# Patient Record
Sex: Female | Born: 1960 | Race: Black or African American | Hispanic: No | Marital: Married | Smoking: Never smoker
Health system: Southern US, Community
[De-identification: ages and names within clinical notes are randomized; demographics above are authoritative.]

## PROBLEM LIST (undated history)

## (undated) DIAGNOSIS — E079 Disorder of thyroid, unspecified: Secondary | ICD-10-CM

## (undated) DIAGNOSIS — T7840XA Allergy, unspecified, initial encounter: Secondary | ICD-10-CM

## (undated) DIAGNOSIS — I10 Essential (primary) hypertension: Secondary | ICD-10-CM

## (undated) DIAGNOSIS — R011 Cardiac murmur, unspecified: Secondary | ICD-10-CM

## (undated) DIAGNOSIS — E7800 Pure hypercholesterolemia, unspecified: Secondary | ICD-10-CM

## (undated) DIAGNOSIS — R42 Dizziness and giddiness: Secondary | ICD-10-CM

## (undated) DIAGNOSIS — M1990 Unspecified osteoarthritis, unspecified site: Secondary | ICD-10-CM

## (undated) DIAGNOSIS — M069 Rheumatoid arthritis, unspecified: Secondary | ICD-10-CM

## (undated) DIAGNOSIS — Z5189 Encounter for other specified aftercare: Secondary | ICD-10-CM

## (undated) DIAGNOSIS — I252 Old myocardial infarction: Secondary | ICD-10-CM

## (undated) DIAGNOSIS — D649 Anemia, unspecified: Secondary | ICD-10-CM

## (undated) DIAGNOSIS — M797 Fibromyalgia: Secondary | ICD-10-CM

## (undated) DIAGNOSIS — IMO0002 Reserved for concepts with insufficient information to code with codable children: Secondary | ICD-10-CM

## (undated) DIAGNOSIS — Z8719 Personal history of other diseases of the digestive system: Secondary | ICD-10-CM

## (undated) DIAGNOSIS — K219 Gastro-esophageal reflux disease without esophagitis: Secondary | ICD-10-CM

## (undated) DIAGNOSIS — K259 Gastric ulcer, unspecified as acute or chronic, without hemorrhage or perforation: Secondary | ICD-10-CM

## (undated) DIAGNOSIS — Z8711 Personal history of peptic ulcer disease: Secondary | ICD-10-CM

## (undated) DIAGNOSIS — M329 Systemic lupus erythematosus, unspecified: Secondary | ICD-10-CM

## (undated) HISTORY — DX: Disorder of thyroid, unspecified: E07.9

## (undated) HISTORY — PX: CERVICAL DISCECTOMY: SHX98

## (undated) HISTORY — DX: Gastro-esophageal reflux disease without esophagitis: K21.9

## (undated) HISTORY — DX: Encounter for other specified aftercare: Z51.89

## (undated) HISTORY — DX: Anemia, unspecified: D64.9

## (undated) HISTORY — DX: Personal history of peptic ulcer disease: Z87.11

## (undated) HISTORY — PX: UPPER GASTROINTESTINAL ENDOSCOPY: SHX188

## (undated) HISTORY — PX: OTHER SURGICAL HISTORY: SHX169

## (undated) HISTORY — DX: Personal history of other diseases of the digestive system: Z87.19

## (undated) HISTORY — PX: TONSILLECTOMY: SUR1361

## (undated) HISTORY — PX: COLONOSCOPY: SHX174

## (undated) HISTORY — DX: Gastric ulcer, unspecified as acute or chronic, without hemorrhage or perforation: K25.9

## (undated) HISTORY — PX: ABDOMINAL HYSTERECTOMY: SHX81

## (undated) HISTORY — DX: Allergy, unspecified, initial encounter: T78.40XA

## (undated) SURGERY — MANOMETRY, ESOPHAGUS

---

## 1997-03-09 ENCOUNTER — Inpatient Hospital Stay (HOSPITAL_COMMUNITY): Admission: AD | Admit: 1997-03-09 | Discharge: 1997-03-09 | Payer: Self-pay | Admitting: *Deleted

## 1997-04-06 ENCOUNTER — Inpatient Hospital Stay (HOSPITAL_COMMUNITY): Admission: RE | Admit: 1997-04-06 | Discharge: 1997-04-06 | Payer: Self-pay | Admitting: *Deleted

## 1997-05-05 ENCOUNTER — Inpatient Hospital Stay (HOSPITAL_COMMUNITY): Admission: AD | Admit: 1997-05-05 | Discharge: 1997-05-05 | Payer: Self-pay | Admitting: *Deleted

## 1997-06-10 ENCOUNTER — Inpatient Hospital Stay (HOSPITAL_COMMUNITY): Admission: AD | Admit: 1997-06-10 | Discharge: 1997-06-10 | Payer: Self-pay | Admitting: *Deleted

## 1997-10-22 ENCOUNTER — Observation Stay (HOSPITAL_COMMUNITY): Admission: RE | Admit: 1997-10-22 | Discharge: 1997-10-24 | Payer: Self-pay | Admitting: Obstetrics & Gynecology

## 1997-11-14 ENCOUNTER — Inpatient Hospital Stay (HOSPITAL_COMMUNITY): Admission: AD | Admit: 1997-11-14 | Discharge: 1997-11-14 | Payer: Self-pay | Admitting: Obstetrics and Gynecology

## 1998-02-22 ENCOUNTER — Encounter: Payer: Self-pay | Admitting: General Surgery

## 1998-02-22 ENCOUNTER — Ambulatory Visit (HOSPITAL_COMMUNITY): Admission: RE | Admit: 1998-02-22 | Discharge: 1998-02-22 | Payer: Self-pay | Admitting: General Surgery

## 1998-02-22 ENCOUNTER — Emergency Department (HOSPITAL_COMMUNITY): Admission: EM | Admit: 1998-02-22 | Discharge: 1998-02-22 | Payer: Self-pay | Admitting: Pulmonary Disease

## 1998-02-22 ENCOUNTER — Encounter: Payer: Self-pay | Admitting: Emergency Medicine

## 1998-07-25 ENCOUNTER — Emergency Department (HOSPITAL_COMMUNITY): Admission: EM | Admit: 1998-07-25 | Discharge: 1998-07-25 | Payer: Self-pay

## 1999-05-01 ENCOUNTER — Other Ambulatory Visit: Admission: RE | Admit: 1999-05-01 | Discharge: 1999-05-01 | Payer: Self-pay | Admitting: Obstetrics & Gynecology

## 1999-05-15 ENCOUNTER — Emergency Department (HOSPITAL_COMMUNITY): Admission: EM | Admit: 1999-05-15 | Discharge: 1999-05-15 | Payer: Self-pay | Admitting: Emergency Medicine

## 1999-05-15 ENCOUNTER — Encounter: Payer: Self-pay | Admitting: Emergency Medicine

## 2000-05-12 ENCOUNTER — Emergency Department (HOSPITAL_COMMUNITY): Admission: EM | Admit: 2000-05-12 | Discharge: 2000-05-12 | Payer: Self-pay | Admitting: Internal Medicine

## 2000-05-13 ENCOUNTER — Encounter: Payer: Self-pay | Admitting: Emergency Medicine

## 2000-05-13 ENCOUNTER — Emergency Department (HOSPITAL_COMMUNITY): Admission: EM | Admit: 2000-05-13 | Discharge: 2000-05-13 | Payer: Self-pay | Admitting: Emergency Medicine

## 2000-12-15 ENCOUNTER — Encounter: Payer: Self-pay | Admitting: Gastroenterology

## 2001-03-14 ENCOUNTER — Encounter: Payer: Self-pay | Admitting: Gastroenterology

## 2001-03-14 ENCOUNTER — Ambulatory Visit (HOSPITAL_COMMUNITY): Admission: RE | Admit: 2001-03-14 | Discharge: 2001-03-14 | Payer: Self-pay | Admitting: Gastroenterology

## 2001-05-17 ENCOUNTER — Emergency Department (HOSPITAL_COMMUNITY): Admission: EM | Admit: 2001-05-17 | Discharge: 2001-05-17 | Payer: Self-pay | Admitting: Emergency Medicine

## 2001-11-02 ENCOUNTER — Emergency Department (HOSPITAL_COMMUNITY): Admission: EM | Admit: 2001-11-02 | Discharge: 2001-11-02 | Payer: Self-pay | Admitting: Emergency Medicine

## 2001-11-02 ENCOUNTER — Encounter: Payer: Self-pay | Admitting: Emergency Medicine

## 2002-01-03 ENCOUNTER — Emergency Department (HOSPITAL_COMMUNITY): Admission: EM | Admit: 2002-01-03 | Discharge: 2002-01-03 | Payer: Self-pay

## 2002-01-18 ENCOUNTER — Ambulatory Visit (HOSPITAL_COMMUNITY): Admission: RE | Admit: 2002-01-18 | Discharge: 2002-01-18 | Payer: Self-pay | Admitting: Family Medicine

## 2002-01-18 ENCOUNTER — Encounter (INDEPENDENT_AMBULATORY_CARE_PROVIDER_SITE_OTHER): Payer: Self-pay | Admitting: Specialist

## 2002-01-18 ENCOUNTER — Encounter: Payer: Self-pay | Admitting: Gastroenterology

## 2002-01-20 ENCOUNTER — Ambulatory Visit (HOSPITAL_COMMUNITY): Admission: RE | Admit: 2002-01-20 | Discharge: 2002-01-20 | Payer: Self-pay | Admitting: Gastroenterology

## 2002-01-21 ENCOUNTER — Encounter: Payer: Self-pay | Admitting: Internal Medicine

## 2002-01-21 ENCOUNTER — Emergency Department (HOSPITAL_COMMUNITY): Admission: EM | Admit: 2002-01-21 | Discharge: 2002-01-21 | Payer: Self-pay | Admitting: Emergency Medicine

## 2002-04-25 ENCOUNTER — Other Ambulatory Visit: Admission: RE | Admit: 2002-04-25 | Discharge: 2002-04-25 | Payer: Self-pay | Admitting: Obstetrics & Gynecology

## 2002-10-07 ENCOUNTER — Emergency Department (HOSPITAL_COMMUNITY): Admission: EM | Admit: 2002-10-07 | Discharge: 2002-10-07 | Payer: Self-pay | Admitting: Emergency Medicine

## 2003-01-27 ENCOUNTER — Emergency Department (HOSPITAL_COMMUNITY): Admission: EM | Admit: 2003-01-27 | Discharge: 2003-01-27 | Payer: Self-pay | Admitting: Emergency Medicine

## 2003-02-27 ENCOUNTER — Emergency Department (HOSPITAL_COMMUNITY): Admission: EM | Admit: 2003-02-27 | Discharge: 2003-02-27 | Payer: Self-pay | Admitting: *Deleted

## 2003-03-30 ENCOUNTER — Ambulatory Visit (HOSPITAL_COMMUNITY): Admission: RE | Admit: 2003-03-30 | Discharge: 2003-03-30 | Payer: Self-pay | Admitting: Gastroenterology

## 2003-04-17 ENCOUNTER — Ambulatory Visit (HOSPITAL_COMMUNITY): Admission: RE | Admit: 2003-04-17 | Discharge: 2003-04-17 | Payer: Self-pay | Admitting: Gastroenterology

## 2003-04-17 ENCOUNTER — Encounter: Payer: Self-pay | Admitting: Gastroenterology

## 2003-06-03 ENCOUNTER — Emergency Department (HOSPITAL_COMMUNITY): Admission: EM | Admit: 2003-06-03 | Discharge: 2003-06-03 | Payer: Self-pay | Admitting: *Deleted

## 2003-06-11 ENCOUNTER — Other Ambulatory Visit: Admission: RE | Admit: 2003-06-11 | Discharge: 2003-06-11 | Payer: Self-pay | Admitting: Obstetrics & Gynecology

## 2003-06-19 ENCOUNTER — Encounter: Admission: RE | Admit: 2003-06-19 | Discharge: 2003-06-19 | Payer: Self-pay | Admitting: Obstetrics & Gynecology

## 2003-09-09 ENCOUNTER — Emergency Department (HOSPITAL_COMMUNITY): Admission: EM | Admit: 2003-09-09 | Discharge: 2003-09-09 | Payer: Self-pay | Admitting: Emergency Medicine

## 2003-12-24 ENCOUNTER — Emergency Department (HOSPITAL_COMMUNITY): Admission: EM | Admit: 2003-12-24 | Discharge: 2003-12-24 | Payer: Self-pay | Admitting: Emergency Medicine

## 2004-02-02 ENCOUNTER — Emergency Department (HOSPITAL_COMMUNITY): Admission: EM | Admit: 2004-02-02 | Discharge: 2004-02-02 | Payer: Self-pay | Admitting: Emergency Medicine

## 2004-02-05 ENCOUNTER — Emergency Department (HOSPITAL_COMMUNITY): Admission: EM | Admit: 2004-02-05 | Discharge: 2004-02-05 | Payer: Self-pay | Admitting: Family Medicine

## 2004-03-04 ENCOUNTER — Ambulatory Visit: Payer: Self-pay | Admitting: Gastroenterology

## 2004-03-14 ENCOUNTER — Emergency Department (HOSPITAL_COMMUNITY): Admission: EM | Admit: 2004-03-14 | Discharge: 2004-03-14 | Payer: Self-pay | Admitting: Family Medicine

## 2004-05-05 ENCOUNTER — Ambulatory Visit (HOSPITAL_COMMUNITY): Admission: RE | Admit: 2004-05-05 | Discharge: 2004-05-05 | Payer: Self-pay | Admitting: Gastroenterology

## 2004-05-05 ENCOUNTER — Ambulatory Visit: Payer: Self-pay | Admitting: Gastroenterology

## 2004-06-18 ENCOUNTER — Emergency Department (HOSPITAL_COMMUNITY): Admission: EM | Admit: 2004-06-18 | Discharge: 2004-06-18 | Payer: Self-pay | Admitting: Family Medicine

## 2005-02-22 ENCOUNTER — Inpatient Hospital Stay (HOSPITAL_COMMUNITY): Admission: EM | Admit: 2005-02-22 | Discharge: 2005-02-25 | Payer: Self-pay | Admitting: Emergency Medicine

## 2005-02-24 ENCOUNTER — Ambulatory Visit: Payer: Self-pay | Admitting: Critical Care Medicine

## 2005-03-11 ENCOUNTER — Ambulatory Visit: Payer: Self-pay | Admitting: Gastroenterology

## 2005-03-13 ENCOUNTER — Ambulatory Visit: Payer: Self-pay | Admitting: Gastroenterology

## 2005-03-25 ENCOUNTER — Ambulatory Visit: Payer: Self-pay | Admitting: Critical Care Medicine

## 2005-05-13 ENCOUNTER — Emergency Department (HOSPITAL_COMMUNITY): Admission: EM | Admit: 2005-05-13 | Discharge: 2005-05-13 | Payer: Self-pay | Admitting: Emergency Medicine

## 2005-07-25 ENCOUNTER — Emergency Department (HOSPITAL_COMMUNITY): Admission: AD | Admit: 2005-07-25 | Discharge: 2005-07-25 | Payer: Self-pay | Admitting: Family Medicine

## 2005-10-26 ENCOUNTER — Ambulatory Visit (HOSPITAL_COMMUNITY): Admission: RE | Admit: 2005-10-26 | Discharge: 2005-10-26 | Payer: Self-pay | Admitting: Gastroenterology

## 2005-10-30 ENCOUNTER — Ambulatory Visit: Payer: Self-pay | Admitting: Gastroenterology

## 2006-02-08 ENCOUNTER — Ambulatory Visit (HOSPITAL_COMMUNITY): Admission: RE | Admit: 2006-02-08 | Discharge: 2006-02-09 | Payer: Self-pay | Admitting: Neurosurgery

## 2006-04-19 ENCOUNTER — Ambulatory Visit: Payer: Self-pay | Admitting: Gastroenterology

## 2006-10-17 ENCOUNTER — Emergency Department (HOSPITAL_COMMUNITY): Admission: EM | Admit: 2006-10-17 | Discharge: 2006-10-17 | Payer: Self-pay | Admitting: Family Medicine

## 2006-10-29 ENCOUNTER — Ambulatory Visit (HOSPITAL_COMMUNITY): Admission: RE | Admit: 2006-10-29 | Discharge: 2006-10-29 | Payer: Self-pay | Admitting: Gastroenterology

## 2006-10-29 ENCOUNTER — Encounter: Payer: Self-pay | Admitting: Gastroenterology

## 2006-11-09 ENCOUNTER — Ambulatory Visit: Payer: Self-pay | Admitting: Gastroenterology

## 2007-04-11 ENCOUNTER — Emergency Department (HOSPITAL_COMMUNITY): Admission: EM | Admit: 2007-04-11 | Discharge: 2007-04-11 | Payer: Self-pay | Admitting: Emergency Medicine

## 2007-04-30 ENCOUNTER — Encounter: Admission: RE | Admit: 2007-04-30 | Discharge: 2007-04-30 | Payer: Self-pay | Admitting: Neurosurgery

## 2007-05-13 ENCOUNTER — Emergency Department (HOSPITAL_COMMUNITY): Admission: EM | Admit: 2007-05-13 | Discharge: 2007-05-13 | Payer: Self-pay | Admitting: Emergency Medicine

## 2007-08-31 ENCOUNTER — Ambulatory Visit (HOSPITAL_BASED_OUTPATIENT_CLINIC_OR_DEPARTMENT_OTHER): Admission: RE | Admit: 2007-08-31 | Discharge: 2007-08-31 | Payer: Self-pay | Admitting: Cardiology

## 2007-09-03 ENCOUNTER — Ambulatory Visit: Payer: Self-pay | Admitting: Internal Medicine

## 2007-09-05 ENCOUNTER — Encounter: Admission: RE | Admit: 2007-09-05 | Discharge: 2007-09-05 | Payer: Self-pay | Admitting: Cardiology

## 2007-10-03 ENCOUNTER — Telehealth: Payer: Self-pay | Admitting: Gastroenterology

## 2007-10-04 ENCOUNTER — Ambulatory Visit: Payer: Self-pay | Admitting: Gastroenterology

## 2007-10-04 DIAGNOSIS — K222 Esophageal obstruction: Secondary | ICD-10-CM | POA: Insufficient documentation

## 2007-10-04 DIAGNOSIS — K219 Gastro-esophageal reflux disease without esophagitis: Secondary | ICD-10-CM | POA: Insufficient documentation

## 2007-10-04 DIAGNOSIS — R1314 Dysphagia, pharyngoesophageal phase: Secondary | ICD-10-CM | POA: Insufficient documentation

## 2007-10-04 DIAGNOSIS — K2940 Chronic atrophic gastritis without bleeding: Secondary | ICD-10-CM | POA: Insufficient documentation

## 2007-10-20 ENCOUNTER — Ambulatory Visit: Payer: Self-pay | Admitting: Gastroenterology

## 2007-10-20 ENCOUNTER — Ambulatory Visit (HOSPITAL_COMMUNITY): Admission: RE | Admit: 2007-10-20 | Discharge: 2007-10-20 | Payer: Self-pay | Admitting: Gastroenterology

## 2008-01-25 ENCOUNTER — Telehealth: Payer: Self-pay | Admitting: Gastroenterology

## 2008-02-06 ENCOUNTER — Emergency Department (HOSPITAL_COMMUNITY): Admission: EM | Admit: 2008-02-06 | Discharge: 2008-02-07 | Payer: Self-pay | Admitting: Emergency Medicine

## 2008-02-08 ENCOUNTER — Emergency Department (HOSPITAL_COMMUNITY): Admission: EM | Admit: 2008-02-08 | Discharge: 2008-02-08 | Payer: Self-pay | Admitting: Emergency Medicine

## 2008-04-03 ENCOUNTER — Emergency Department (HOSPITAL_COMMUNITY): Admission: EM | Admit: 2008-04-03 | Discharge: 2008-04-03 | Payer: Self-pay | Admitting: Emergency Medicine

## 2008-04-06 ENCOUNTER — Emergency Department (HOSPITAL_COMMUNITY): Admission: EM | Admit: 2008-04-06 | Discharge: 2008-04-06 | Payer: Self-pay | Admitting: Family Medicine

## 2008-05-14 ENCOUNTER — Telehealth: Payer: Self-pay | Admitting: Gastroenterology

## 2008-05-22 ENCOUNTER — Ambulatory Visit (HOSPITAL_COMMUNITY): Admission: RE | Admit: 2008-05-22 | Discharge: 2008-05-22 | Payer: Self-pay | Admitting: Gastroenterology

## 2008-05-22 ENCOUNTER — Ambulatory Visit: Payer: Self-pay | Admitting: Gastroenterology

## 2008-06-19 ENCOUNTER — Telehealth: Payer: Self-pay | Admitting: Gastroenterology

## 2008-06-21 ENCOUNTER — Ambulatory Visit: Payer: Self-pay | Admitting: Gastroenterology

## 2008-07-09 ENCOUNTER — Ambulatory Visit (HOSPITAL_COMMUNITY): Admission: RE | Admit: 2008-07-09 | Discharge: 2008-07-09 | Payer: Self-pay | Admitting: Gastroenterology

## 2008-08-13 ENCOUNTER — Ambulatory Visit: Payer: Self-pay | Admitting: Gastroenterology

## 2008-11-07 ENCOUNTER — Telehealth: Payer: Self-pay | Admitting: Gastroenterology

## 2008-12-06 ENCOUNTER — Telehealth: Payer: Self-pay | Admitting: Gastroenterology

## 2008-12-10 ENCOUNTER — Encounter: Admission: RE | Admit: 2008-12-10 | Discharge: 2008-12-10 | Payer: Self-pay | Admitting: Cardiology

## 2008-12-24 ENCOUNTER — Ambulatory Visit (HOSPITAL_COMMUNITY): Admission: RE | Admit: 2008-12-24 | Discharge: 2008-12-24 | Payer: Self-pay | Admitting: Gastroenterology

## 2008-12-24 ENCOUNTER — Ambulatory Visit: Payer: Self-pay | Admitting: Gastroenterology

## 2009-02-11 ENCOUNTER — Telehealth: Payer: Self-pay | Admitting: Gastroenterology

## 2009-02-13 ENCOUNTER — Ambulatory Visit (HOSPITAL_COMMUNITY): Admission: RE | Admit: 2009-02-13 | Discharge: 2009-02-13 | Payer: Self-pay | Admitting: Gastroenterology

## 2009-04-10 ENCOUNTER — Encounter: Payer: Self-pay | Admitting: Gastroenterology

## 2009-04-24 ENCOUNTER — Telehealth: Payer: Self-pay | Admitting: Gastroenterology

## 2009-08-22 ENCOUNTER — Telehealth: Payer: Self-pay | Admitting: Gastroenterology

## 2009-10-21 ENCOUNTER — Telehealth: Payer: Self-pay | Admitting: Gastroenterology

## 2009-10-31 ENCOUNTER — Ambulatory Visit: Payer: Self-pay | Admitting: Gastroenterology

## 2009-11-05 ENCOUNTER — Encounter: Admission: RE | Admit: 2009-11-05 | Discharge: 2009-11-05 | Payer: Self-pay | Admitting: Cardiology

## 2009-11-25 ENCOUNTER — Telehealth: Payer: Self-pay | Admitting: Gastroenterology

## 2009-12-05 ENCOUNTER — Emergency Department (HOSPITAL_COMMUNITY): Admission: EM | Admit: 2009-12-05 | Discharge: 2009-12-05 | Payer: Self-pay | Admitting: Emergency Medicine

## 2010-01-10 ENCOUNTER — Encounter: Payer: Self-pay | Admitting: Gastroenterology

## 2010-01-10 ENCOUNTER — Ambulatory Visit (HOSPITAL_COMMUNITY)
Admission: RE | Admit: 2010-01-10 | Discharge: 2010-01-10 | Payer: Self-pay | Source: Home / Self Care | Attending: Gastroenterology | Admitting: Gastroenterology

## 2010-01-21 ENCOUNTER — Encounter
Admission: RE | Admit: 2010-01-21 | Discharge: 2010-01-21 | Payer: Self-pay | Source: Home / Self Care | Attending: Cardiology | Admitting: Cardiology

## 2010-01-22 ENCOUNTER — Telehealth (INDEPENDENT_AMBULATORY_CARE_PROVIDER_SITE_OTHER): Payer: Self-pay

## 2010-01-24 ENCOUNTER — Encounter: Payer: Self-pay | Admitting: Gastroenterology

## 2010-01-27 ENCOUNTER — Emergency Department (HOSPITAL_COMMUNITY)
Admission: EM | Admit: 2010-01-27 | Discharge: 2010-01-27 | Payer: Self-pay | Source: Home / Self Care | Admitting: Family Medicine

## 2010-02-03 LAB — POCT URINALYSIS DIPSTICK
Bilirubin Urine: NEGATIVE
Hgb urine dipstick: NEGATIVE
Ketones, ur: NEGATIVE mg/dL
Nitrite: NEGATIVE
Protein, ur: 30 mg/dL — AB
Specific Gravity, Urine: 1.02 (ref 1.005–1.030)
Urine Glucose, Fasting: NEGATIVE mg/dL
Urobilinogen, UA: 1 mg/dL (ref 0.0–1.0)
pH: 7 (ref 5.0–8.0)

## 2010-02-10 ENCOUNTER — Encounter: Payer: Self-pay | Admitting: Cardiology

## 2010-02-13 ENCOUNTER — Encounter: Payer: Self-pay | Admitting: Gastroenterology

## 2010-02-13 ENCOUNTER — Ambulatory Visit (HOSPITAL_COMMUNITY)
Admission: RE | Admit: 2010-02-13 | Discharge: 2010-02-13 | Payer: Self-pay | Source: Home / Self Care | Attending: Gastroenterology | Admitting: Gastroenterology

## 2010-02-18 ENCOUNTER — Encounter: Payer: Self-pay | Admitting: Gastroenterology

## 2010-02-18 NOTE — Assessment & Plan Note (Signed)
Summary: dysphagia/sheri    History of Present Illness Visit Type: Follow-up Visit Primary GI MD: Melvia Heaps MD [facility] Primary Provider: Donia Guiles, MD Chief Complaint: dysphagia History of Present Illness:   Patricia Reed has returned again complaining of dysphagia.  She has severe dysphagia to solids  and requires liquids to force the food down.  At times she has dysphagia to liquids.  She has had several dilatations in the last couple of years.  She claims dilatation therapy does help her swallowing problems but symptoms inexoribly  recur.  She may have mild odynophagia at times.  A barium swallow in January, 2011 did not demonstrate any gross motility abnormalities or  stricture.   GI Review of Systems    Reports chest pain, dysphagia with liquids, dysphagia with solids, and  nausea.      Denies abdominal pain, acid reflux, belching, bloating, heartburn, loss of appetite, vomiting, vomiting blood, weight loss, and  weight gain.      Reports anal fissur.     Denies black tarry stools, change in bowel habit, constipation, diarrhea, diverticulosis, fecal incontinence, heme positive stool, hemorrhoids, irritable bowel syndrome, jaundice, light color stool, liver problems, rectal bleeding, and  rectal pain.    Current Medications (verified): 1)  Nexium 40 Mg  Cpdr (Esomeprazole Magnesium) .Marland Kitchen.. 1 Capsule  Bid  30 Minutes Before Meal  Allergies (verified): 1)  ! * Narcotics 2)  Novocain 3)  Morphine 4)  Vicodin 5)  Demerol 6)  Percocet 7)  Aspirin 8)  Ibuprofen  Past History:  Past Medical History: Reviewed history from 10/03/2007 and no changes required. Esophageal Stricture Hiatal Hernia Hemorrhoids  Past Surgical History: Reviewed history from 08/13/2008 and no changes required. neck surgery Hysterectomy C-Section x 3  Family History: Reviewed history from 06/21/2008 and no changes required. Family History of Breast Cancer: MGGM Family History of Diabetes: MGM,  Father No FH of Colon Cancer:  Social History: Reviewed history from 06/21/2008 and no changes required. 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  Alcohol Use - no Daily Caffeine Use Illicit Drug Use - no  Review of Systems  The patient denies allergy/sinus, anemia, anxiety-new, arthritis/joint pain, back pain, blood in urine, breast changes/lumps, change in vision, confusion, cough, coughing up blood, depression-new, fainting, fatigue, fever, headaches-new, hearing problems, heart murmur, heart rhythm changes, itching, menstrual pain, muscle pains/cramps, night sweats, nosebleeds, pregnancy symptoms, shortness of breath, skin rash, sleeping problems, sore throat, swelling of feet/legs, swollen lymph glands, thirst - excessive , urination - excessive , urination changes/pain, urine leakage, vision changes, and voice change.    Vital Signs:  Patient profile:   50 year old female Height:      65.5 inches Weight:      236.50 pounds BMI:     38.90 Pulse rate:   80 / minute Pulse rhythm:   regular BP sitting:   170 / 90  (left arm)  Vitals Entered By: Milford Cage NCMA (October 31, 2009 1:37 PM)   Impression & Recommendations:  Problem # 1:  DYSPHAGIA (ICD-787.29)  I still have a concern that the patient may have a motility disorder rather than a fixed stricture.  Recommendations #1 esophageal manometry study.  If this is inconclusive then I will repeat her dilation with balloon dilators  Orders: Manometry (Manometry)  Patient Instructions: 1)  Copy sent to : Donia Guiles, MD 2)  Your Esophageal Manometry is scheduled for Monday, October, 17 at 8:30 am. Wilmon Arms at  8:15 am at [facility]. 3)  Stay on Nexium  4)  The medication list was reviewed and reconciled.  All changed / newly prescribed medications were explained.  A complete medication list was provided to the patient / caregiver.

## 2010-02-18 NOTE — Progress Notes (Signed)
Summary: TRIAGE-BA Swallow Scheduled   Phone Note Call from Patient Call back at Home Phone [phone]   Caller: Patient Call For: Dr. Arlyce Dice Reason for Call: Talk to Nurse Summary of Call: Pt is having problems eating, feels like she is "choking" Initial call taken by: Karna Christmas,  February 11, 2009 12:29 PM  Follow-up for Phone Call        Last Endo/Balloon Dil. was 12-24-08. Pt. states her foods/pills are getting stuck in her chest, water feels as if it fills up her esophagus. Worse in the last 2 weeks. Using Nexium two times a day.   [facility] PLEASE ADVISE  Follow-up by: Laureen Ochs LPN,  February 11, 2009 1:47 PM  Additional Follow-up for Phone Call Additional follow up Details #1::        schedule barium swallow Additional Follow-up by: Louis Meckel MD,  February 11, 2009 2:42 PM    Additional Follow-up for Phone Call Additional follow up Details #2::    Barium Swallow is scheduled for 02-13-09 at 9am at [facility]. NPO after 12mn and arrive at 8:45am to register. Message left for pt. with above appt. information. Pt. instructed to call back as needed.  Follow-up by: Laureen Ochs LPN,  February 11, 2009 3:20 PM

## 2010-02-18 NOTE — Progress Notes (Signed)
Summary: Triage   Phone Note Call from Patient Call back at Home Phone [phone]   Caller: Patient Call For: Dr. Arlyce Dice Reason for Call: Talk to Nurse Summary of Call: pt. is having problems w/her esophagus and cannot eat anything Initial call taken by: Karna Christmas,  October 21, 2009 1:47 PM  Follow-up for Phone Call        Message left for patient to callback. Laureen Ochs LPN  October 21, 2009 2:14 PM  left message for pt to call back. Jesse Fall RN  October 22, 2009 11:54 AM  Patient  c/o worsening dysphagia  to solids.  Last dil was 12/10.  She states she is taking Nexium two times a day.  Please advise if ok for direct EGD DIL or office visit? Follow-up by: Darcey Nora RN, CGRN,  October 22, 2009 4:43 PM  Additional Follow-up for Phone Call Additional follow up Details #1::        She needs an OV Additional Follow-up by: Louis Meckel MD,  October 23, 2009 9:16 AM    Additional Follow-up for Phone Call Additional follow up Details #2::    Rev scheduled for 10/31/09 1:30 Follow-up by: Darcey Nora RN, CGRN,  October 23, 2009 11:16 AM

## 2010-02-18 NOTE — Progress Notes (Signed)
Summary: Schedule procedure for pt   Phone Note Other Incoming   Caller: Steff @ [facility] [phone] for Dr Marzetta Board nurse Summary of Call: Needs to schedule pt for a procedure but doesnt know Dr Marzetta Board schedule. Initial call taken by: Leanor Kail [facility],  November 25, 2009 10:02 AM  Follow-up for Phone Call        Dr Arlyce Dice has ordered an monometry on this patient , but patient only wants his done sedated.  Patient will be sedated to pass the probe into her esophagus  then allowed to wake up and they will then perform the manometry.  Patient  is advised most likely will not be covered by her insurance company and she needs to know upfront that she will incur facility fee charges that may or may not be paid by her insurance.  Patient  verbalizes understanding.  She will contact her insurance company.  Patient is scheduled for 01/10/10 9:00 at [facility] Follow-up by: Darcey Nora RN, CGRN,  November 25, 2009 10:49 AM

## 2010-02-18 NOTE — Progress Notes (Signed)
Summary: Samples   Phone Note Call from Patient Call back at Home Phone [phone]   Caller: Patient Call For: Dr. Arlyce Dice Reason for Call: Talk to Nurse Summary of Call: Would like some samples of Nexium Initial call taken by: Karna Christmas,  August 22, 2009 4:44 PM  Follow-up for Phone Call        L/M for pt to come in tomorrow and pick up Nexium samples Follow-up by: Merri Ray CMA Duncan Dull),  August 22, 2009 4:54 PM

## 2010-02-18 NOTE — Progress Notes (Signed)
Summary: Nexium samples?   Phone Note Call from Patient Call back at [facility] Phone [phone]   Call For: Dr Arlyce Dice Summary of Call: Samples of Nexium? Initial call taken by: Leanor Kail [facility],  April 24, 2009 12:03 PM  Follow-up for Phone Call        called patient back she wants samples of nexium but look like patient is on Dexilant. Left message on patients machine to call back.  Follow-up by: Harlow Mares CMA Duncan Dull),  April 24, 2009 2:59 PM  Additional Follow-up for Phone Call Additional follow up Details #1::        samples up front, pt aware Additional Follow-up by: Harlow Mares CMA (AAMA),  April 24, 2009 3:15 PM    New/Updated Medications: NEXIUM 40 MG  CPDR (ESOMEPRAZOLE MAGNESIUM) 1 capsule each day 30 minutes before meal

## 2010-02-20 NOTE — Procedures (Signed)
Summary: Upper Endoscopy  Patient: [name] Note: All result statuses are Final unless otherwise noted.  Tests: (1) Upper Endoscopy (EGD)   EGD Upper Endoscopy       DONE     [facility]     [address] [place], [place]  [address]           ENDOSCOPY PROCEDURE REPORT           PATIENT:  Patricia, Reed  MR#:  [id number]     BIRTHDATE:  January 07, 1961, 49 yrs. old  GENDER:  female           ENDOSCOPIST:  Barbette Hair. Arlyce Dice, MD     Referred by:  Donia Guiles, M.D.           PROCEDURE DATE:  02/13/2010     PROCEDURE:  EGD w/botox injection     ASA CLASS:  Class II     INDICATIONS:  dysphagia           MEDICATIONS:   Fentanyl 75 mcg IM, Versed 7 mg, Benadryl 50 mg IV,     glycopyrrolate (Robinal) 0.2 mg IV     TOPICAL ANESTHETIC:  Cetacaine Spray           DESCRIPTION OF PROCEDURE:   After the risks benefits and     alternatives of the procedure were thoroughly explained, informed     consent was obtained.  The EG-[id number] ([id number]) endoscope was     introduced through the mouth and advanced to the third portion of     the duodenum, without limitations.  The instrument was slowly     withdrawn as the mucosa was fully examined.     <<PROCEDUREIMAGES>>           A hiatal hernia was found at the gastroesophageal junction. 2cm     sliding hiatal hernia  Otherwise the examination was normal (see     image002, image001, image004, image005, and image006). botox     injection 25 units (1cc) injected submucosally into each quadrant     at GE junction    Retroflexed views revealed no abnormalities.     The scope was then withdrawn from the patient and the procedure     completed.           COMPLICATIONS:  None           ENDOSCOPIC IMPRESSION:     1) Botox injection for possible early achalasia     2) Otherwise normal examination     RECOMMENDATIONS:     1) Call office next 2-3 days to schedule an office appointment     for 1 month           REPEAT EXAM:   No           ______________________________     Barbette Hair. Arlyce Dice, MD           CC:           n.     eSIGNED:   Barbette Hair. Lucio Litsey at 02/13/2010 12:40 PM           [name], [id number]  Note: An exclamation mark (!) indicates a result that was not dispersed into the flowsheet. Document Creation Date: 02/13/2010 12:41 PM _______________________________________________________________________  (1) Order result status: Final Collection or observation date-time: 02/13/2010 12:36 Requested date-time:  Receipt date-time:  Reported date-time:  Referring Physician:   Ordering Physician: Melvia Heaps [phone]) Specimen Source:  Source: Launa Grill Order Number: [id number] Lab site:

## 2010-02-20 NOTE — Procedures (Signed)
Summary: Esophageal Manometry/[place]  Esophageal Manometry/[place]   Imported By: Sherian Rein 01/28/2010 12:17:09  _____________________________________________________________________  External Attachment:    Type:   Image     Comment:   External Document

## 2010-02-20 NOTE — Procedures (Addendum)
Summary: Upper Endoscopy  Patient: Lars Mage Note: All result statuses are Final unless otherwise noted.  Tests: (1) Upper Endoscopy (EGD)   EGD Upper Endoscopy       DONE (C)     [facility]     [address] [place], [place]  [address]           ENDOSCOPY PROCEDURE REPORT           PATIENT:  Niharika, Savino  MR#:  #[id number]     BIRTHDATE:  27-Apr-1960, 49 yrs. old  GENDER:  female           ENDOSCOPIST:  Barbette Hair. Arlyce Dice, MD     Referred by:  Donia Guiles, M.D.           PROCEDURE DATE:  01/10/2010     PROCEDURE:  EGD, diagnostic 43235     ASA CLASS:  Class II     INDICATIONS:  dysphagia, odynophagia           MEDICATIONS:   Benadryl 50 mg IV, Versed 10 mg, glycopyrrolate     (Robinal) 0.2 mg IV     TOPICAL ANESTHETIC:  Cetacaine Spray           DESCRIPTION OF PROCEDURE:   After the risks benefits and     alternatives of the procedure were thoroughly explained, informed     consent was obtained.  The Pentax Gastroscope [id number] endoscope     was introduced through the mouth and advanced to the third portion     of the duodenum, without limitations.  The instrument was slowly     withdrawn as the mucosa was fully examined.     <<PROCEDUREIMAGES>>           The upper, middle, and distal third of the esophagus were     carefully inspected and no abnormalities were noted. The z-line     was well seen at the GEJ. The endoscope was pushed into the fundus     which was normal including a retroflexed view. The antrum,gastric     body, first and second part of the duodenum were unremarkable (see     image1 and image2). An esophageal manometry tube was inserted     transnasally after the scope was withdrawn    Retroflexed views     revealed no abnormalities.    The scope was then withdrawn from     the patient and the procedure completed.           COMPLICATIONS:  None           ENDOSCOPIC IMPRESSION:     1) Normal EGD     RECOMMENDATIONS:  Esophageal manometry     MAC sedation for future procedures           REPEAT EXAM:  No           ______________________________     Barbette Hair. Arlyce Dice, MD           CC:           n.     REVISED:  01/10/2010 10:29 AM     eSIGNED:   Barbette Hair. Kaplan at 01/10/2010 10:29 AM           Lars Mage, #[id number]  Note: An exclamation mark (!) indicates a result that was not dispersed into the flowsheet. Document Creation Date: 01/14/2010 8:32 AM _______________________________________________________________________  (1) Order result status: Final Collection or  observation date-time: 01/10/2010 09:33 Requested date-time:  Receipt date-time:  Reported date-time:  Referring Physician:   Ordering Physician: Melvia Heaps [phone]) Specimen Source:  Source: Launa Grill Order Number: [id number] Lab site:

## 2010-02-20 NOTE — Progress Notes (Signed)
  0 ---- Converted from flag ---- ---- 01/22/2010 2:13 PM, Louis Meckel MD wrote: Please schedule pt for EGD with botox injection.  Inform pt that manometry was not specific but I think it's worthwhile trying this for her dysphagia. ------------------------------       Additional Follow-up for Phone Call Additional follow up Details #2::    Left message to call back Selinda Michaels, RN  Left message to call back Selinda Michaels, RN 01/24/10 @2 :30pm  Additional Follow-up for Phone Call Additional follow up Details #3:: Details for Additional Follow-up Action Taken: Scheduled patient for EGD with Botox injections for 02/13/10 @ 12:30pm at [facility]. Patient aware of appointment date and time. Letter sent to patient. Additional Follow-up by: Selinda Michaels RN,  January 24, 2010 3:49 PM   Appended Document:     Clinical Lists Changes  Orders: Added new Test order of ZENDO with Botox (ZENDO/Botox) - Signed

## 2010-02-20 NOTE — Letter (Signed)
Summary: EGD Instructions  [facility]  [address] [place], [place] [address]   Phone: [phone]  Fax: [phone]       Patricia Reed    [birth year]    MRN: [id number]       Procedure Day /Date:02/13/10     Arrival Time: 1130am     Procedure Time:1230pm     Location of Procedure:                     _  _ [facility] ( Outpatient Registration)   PREPARATION FOR ENDOSCOPY   On 02/13/10 THE DAY OF THE PROCEDURE:  1.   No solid foods, milk or milk products are allowed after midnight the night before your procedure.  2.   Do not drink anything colored red or purple.  Avoid juices with pulp.  No orange juice.  3.  You may drink clear liquids until 0830am, which is 4 hours before your procedure.                                                                                                CLEAR LIQUIDS INCLUDE: Water Jello Ice Popsicles Tea (sugar ok, no milk/cream) Powdered fruit flavored drinks Coffee (sugar ok, no milk/cream) Gatorade Juice: apple, white grape, white cranberry  Lemonade Clear bullion, consomm, broth Carbonated beverages (any kind) Strained chicken noodle soup Hard Candy   MEDICATION INSTRUCTIONS  Unless otherwise instructed, you should take regular prescription medications with a small sip of water as early as possible the morning of your procedure.            OTHER INSTRUCTIONS  You will need a responsible adult at least 48 years of age to accompany you and drive you home.   This person must remain in the waiting room during your procedure.  Wear loose fitting clothing that is easily removed.  Leave jewelry and other valuables at home.  However, you may wish to bring a book to read or an iPod/MP3 player to listen to music as you wait for your procedure to start.  Remove all body piercing jewelry and leave at home.  Total time from sign-in until discharge is approximately 2-3 hours.  You should go home  directly after your procedure and rest.  You can resume normal activities the day after your procedure.  The day of your procedure you should not:   Drive   Make legal decisions   Operate machinery   Drink alcohol   Return to work  You will receive specific instructions about eating, activities and medications before you leave.    The above instructions have been reviewed and explained to me by   _______________________    I fully understand and can verbalize these instructions _____________________________ Date _________

## 2010-02-21 NOTE — Op Note (Signed)
  NAMEMADELYNE, Patricia Reed            ACCOUNT NO.:  [id number]  MEDICAL RECORD NO.:  [id number]          PATIENT TYPE:  AMB  LOCATION:  ENDO                         FACILITY:  [facility]  PHYSICIAN:  Barbette Hair. Arlyce Dice, MD,FACGDATE OF BIRTH:  1960/11/24  DATE OF PROCEDURE:  01/10/2010 DATE OF DISCHARGE:  01/10/2010                              OPERATIVE REPORT   PROCEDURE:  Esophageal manometry  HISTORY:  The patient has recurrent dysphagia despite esophageal dilatation therapy.  Recent barium swallow was normal.  Esophageal manometry was performed in the usual pullback technique.  FINDINGS: 1. LES resting pressure was 27.2 mm.  Residual pressure was 3.0 mm     with normal less than 8.  Percent relaxation was 80%. 2. There were normal peristaltic contractions throughout the body of     esophagus with normal amplitude and duration.  There were 10%     retrograde contractions. 3. Upper esophageal sphincter demonstrated normal pressure    contractions and relaxation.  IMPRESSION:  Nonspecific motility disorder of the esophagus.  Findings are not diagnostic for achalasia.  RECOMMENDATIONS:  Trial of Botox injection of the LES.     Barbette Hair. Arlyce Dice, MD,FACG     RDK/MEDQ  D:  01/22/2010  T:  01/22/2010  Job:  [id number]  Electronically Signed by Melvia Heaps MDFACG on 02/21/2010 09:42:29 AM

## 2010-02-25 ENCOUNTER — Telehealth: Payer: Self-pay | Admitting: Gastroenterology

## 2010-02-26 NOTE — Letter (Signed)
Summary: Appt Reminder 2  [facility]  [address] [place], [place] [address]   Phone: [phone]  Fax: [phone]        February 18, 2010 MRN: [id number]    Patricia Reed [address] [place], [place]  [address]    Dear Ms. CARY,   You have a return appointment with Dr. Arlyce Dice on 03/18/10 at 11:15am.  Please remember to bring a complete list of the medicines you are taking, your insurance card and your co-pay.  If you have to cancel or reschedule this appointment, please call before 5:00 pm the evening before to avoid a cancellation fee.  If you have any questions or concerns, please call [phone].    Sincerely,    Selinda Michaels RN  Appended Document: Appt Reminder 2 Letter is mailed to the patient's home address

## 2010-03-06 NOTE — Progress Notes (Signed)
Summary: samples   Phone Note Call from Patient Call back at [phone]   Caller: Patient Call For: DR [facility] Reason for Call: Talk to Nurse Summary of Call: Patient would like some Nexium samples so that she can come pick up today. Initial call taken by: Tawni Levy,  February 25, 2010 3:34 PM  Follow-up for Phone Call        Called pt to tell her she could come pick up Nexium samples...Marland KitchenL/M Follow-up by: Merri Ray CMA Duncan Dull),  February 25, 2010 4:39 PM

## 2010-03-18 ENCOUNTER — Ambulatory Visit (INDEPENDENT_AMBULATORY_CARE_PROVIDER_SITE_OTHER): Payer: BC Managed Care – PPO | Admitting: Gastroenterology

## 2010-03-18 ENCOUNTER — Encounter: Payer: Self-pay | Admitting: Gastroenterology

## 2010-03-18 DIAGNOSIS — R1310 Dysphagia, unspecified: Secondary | ICD-10-CM

## 2010-03-27 NOTE — Assessment & Plan Note (Signed)
Summary: follow up EGD/ sheri    History of Present Illness Visit Type: Follow-up Visit Primary GI MD: Melvia Heaps MD [facility] Primary Provider: Donia Guiles, MD Requesting Provider: na Chief Complaint: F/u from EGD. Pt denies any GI complaints  History of Present Illness:   Patricia Reed  has returned following Botox injection of her LES in January, 2012. With this therapy she reports significant improvement in her dysphagia. She has a nonspecific motility disorder raising the question of early achalasia. Manometry , however, was not diagnostic for  achalasia.   GI Review of Systems      Denies abdominal pain, acid reflux, belching, bloating, chest pain, dysphagia with liquids, dysphagia with solids, heartburn, loss of appetite, nausea, vomiting, vomiting blood, weight loss, and  weight gain.        Denies anal fissure, black tarry stools, change in bowel habit, constipation, diarrhea, diverticulosis, fecal incontinence, heme positive stool, hemorrhoids, irritable bowel syndrome, jaundice, light color stool, liver problems, rectal bleeding, and  rectal pain.    Current Medications (verified): 1)  Nexium 40 Mg  Cpdr (Esomeprazole Magnesium) .Marland Kitchen.. 1 Capsule  Bid  30 Minutes Before Meal 2)  Xopenex Hfa 45 Mcg/act Aero (Levalbuterol Tartrate) .... As Needed  Allergies (verified): 1)  ! * Narcotics 2)  Novocain 3)  Morphine 4)  Vicodin 5)  Demerol 6)  Percocet 7)  Aspirin 8)  Ibuprofen  Past History:  Past Medical History: Esophageal Stricture Hiatal Hernia Hemorrhoids GERD Gastritis  Asthma  Past Surgical History: Reviewed history from 08/13/2008 and no changes required. neck surgery Hysterectomy C-Section x 3  Family History: Reviewed history from 06/21/2008 and no changes required. Family History of Breast Cancer: MGGM Family History of Diabetes: MGM, Father No FH of Colon Cancer:  Social History: Reviewed history from 06/21/2008 and no changes  required. 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  Alcohol Use - no Daily Caffeine Use Illicit Drug Use - no  Review of Systems  The patient denies allergy/sinus, anemia, anxiety-new, arthritis/joint pain, back pain, blood in urine, breast changes/lumps, change in vision, confusion, cough, coughing up blood, depression-new, fainting, fatigue, fever, headaches-new, hearing problems, heart murmur, heart rhythm changes, itching, menstrual pain, muscle pains/cramps, night sweats, nosebleeds, pregnancy symptoms, shortness of breath, skin rash, sleeping problems, sore throat, swelling of feet/legs, swollen lymph glands, thirst - excessive , urination - excessive , urination changes/pain, urine leakage, vision changes, and voice change.    Vital Signs:  Patient profile:   50 year old female Height:      65.5 inches Weight:      232 pounds BMI:     38.16 BSA:     2.12 Pulse rate:   92 / minute Pulse rhythm:   regular BP sitting:   132 / 86  (left arm) Cuff size:   regular  Vitals Entered By: Ok Anis CMA (March 18, 2010 11:31 AM)   Impression & Recommendations:  Problem # 1:  DYSPHAGIA (ICD-787.29)  She's probably has a motility disorder with incomplete relaxation of her LES rather than a fixed stricture.  It  is noteworthy that in the face of dysphagia a barium swallow did not demonstrate any stricture , and she seemingly responded to Botox.  Recommendations #1 repeat Botox injection ; At this point I would not refer her for myotomy unless she had a more diagnostic manometry for achalasia.  Patient Instructions: 1)  Copy sent to : Donia Guiles, MD 2)  We are  giving you Nexium samples today 3)  The medication list was reviewed and reconciled.  All changed / newly prescribed medications were explained.  A complete medication list was provided to the patient / caregiver.

## 2010-04-01 LAB — BASIC METABOLIC PANEL
CO2: 27 mEq/L (ref 19–32)
Chloride: 106 mEq/L (ref 96–112)
Glucose, Bld: 96 mg/dL (ref 70–99)
Potassium: 3.5 mEq/L (ref 3.5–5.1)
Sodium: 141 mEq/L (ref 135–145)

## 2010-04-01 LAB — DIFFERENTIAL
Basophils Relative: 1 % (ref 0–1)
Eosinophils Absolute: 0.1 10*3/uL (ref 0.0–0.7)
Monocytes Absolute: 0.2 10*3/uL (ref 0.1–1.0)
Monocytes Relative: 4 % (ref 3–12)

## 2010-04-01 LAB — CBC
HCT: 39.7 % (ref 36.0–46.0)
Hemoglobin: 13 g/dL (ref 12.0–15.0)
MCH: 28.6 pg (ref 26.0–34.0)
MCHC: 32.8 g/dL (ref 30.0–36.0)

## 2010-05-05 LAB — BASIC METABOLIC PANEL
BUN: 8 mg/dL (ref 6–23)
Calcium: 9 mg/dL (ref 8.4–10.5)
Creatinine, Ser: 0.77 mg/dL (ref 0.4–1.2)
GFR calc non Af Amer: >60 mL/min (ref >60)
Glucose, Bld: 94 mg/dL (ref 70–99)
Potassium: 3.6 mEq/L (ref 3.5–5.1)

## 2010-05-05 LAB — URINALYSIS, ROUTINE W REFLEX MICROSCOPIC
Bilirubin Urine: NEGATIVE
Ketones, ur: 15 mg/dL — AB
Nitrite: NEGATIVE
Protein, ur: NEGATIVE mg/dL
Urobilinogen, UA: 1 mg/dL (ref 0.0–1.0)
pH: 8 (ref 5.0–8.0)

## 2010-05-05 LAB — CBC
Hemoglobin: 14.3 g/dL (ref 12.0–15.0)
MCHC: 32.3 g/dL (ref 30.0–36.0)
MCV: 87.2 fL (ref 78.0–100.0)
RDW: 13.5 % (ref 11.5–15.5)

## 2010-05-05 LAB — POCT I-STAT, CHEM 8
Calcium, Ion: 1.07 mmol/L — ABNORMAL LOW (ref 1.12–1.32)
Chloride: 103 mEq/L (ref 96–112)
HCT: 47 % — ABNORMAL HIGH (ref 36.0–46.0)
Hemoglobin: 16 g/dL — ABNORMAL HIGH (ref 12.0–15.0)

## 2010-05-05 LAB — DIFFERENTIAL
Basophils Absolute: 0 10*3/uL (ref 0.0–0.1)
Basophils Relative: 0 % (ref 0–1)
Eosinophils Absolute: 0 10*3/uL (ref 0.0–0.7)
Monocytes Absolute: 0.2 10*3/uL (ref 0.1–1.0)
Neutro Abs: 5.6 10*3/uL (ref 1.7–7.7)
Neutrophils Relative %: 90 % — ABNORMAL HIGH (ref 43–77)

## 2010-06-03 NOTE — Procedures (Signed)
NAME:  Patricia Reed, Patricia Reed NO.:  [id number]   MEDICAL RECORD NO.:  [id number]          PATIENT TYPE:  OUT   LOCATION:  SLEEP CENTER                 FACILITY:  [facility]   PHYSICIAN:  Clinton D. Maple Hudson, MD, FCCP, FACPDATE OF BIRTH:  Oct 04, 1962   DATE OF STUDY:  08/31/2007                            NOCTURNAL POLYSOMNOGRAM   REFERRING PHYSICIAN:  Osvaldo Shipper. Spruill, M.D.   REFERRING PHYSICIAN:  Dr. Donia Guiles.   INDICATION FOR STUDY:  Hypersomnia with sleep apnea, nocturnal choking  with complaints of post nasal drainage.   EPWORTH SLEEPINESS SCORE:  2/24.  BMI 37.9, weight 228 pounds, height 65  inches, neck 14 inches.   HOME MEDICATIONS:  Home medications charted and reviewed.   SLEEP ARCHITECTURE:  Total sleep time 334 minutes with sleep efficiency  88%.  Stage 1 was 6%, stage 2 is  78%, stage 3 absent, REM 16% of total  sleep time.  Sleep latency 32 minutes, REM latency 73.5 minutes, awake  after sleep onset 13 minutes, arousal index 11.1.  Lunesta was taken at  7:45 p.m.   RESPIRATORY DATA:  Apnea-hypopnea index (AHI) 0.2 per hour, respiratory  disturbance index (RDI) 1.1 per hour.  A single event was recorded,  scored as a central apnea.  REM AHI 1.1 per hour.  There were  insufficient events to permit CPAP titration by split protocol on the  study night.   OXYGEN DATA:  Minimal snoring with oxygen desaturation to a nadir of  92%, mean oxygen saturation through the study was 97.4% on room air.   CARDIAC DATA:  Normal sinus rhythm.   MOVEMENT/PARASOMNIA:  No significant movement disturbance.  No bathroom  trips.   IMPRESSIONS/RECOMMENDATIONS:  1. Insignificant respiratory disturbance.  Apnea-hypopnea index 0.2      per hour with minimal snoring and oxygen desaturation to a nadir of      92%.  2. She comments that sleep disturbances associated particularly with a      sense of post nasal drainage and choking.  Consider evaluation for      rhinosinusitis  or allergy and possible aspiration/reflux.      Clinton D. Maple Hudson, MD, [facility], FACP  Diplomate, Biomedical engineer of Sleep Medicine  Electronically Signed     CDY/MEDQ  D:  09/03/2007 12:56:58  T:  09/03/2007 14:22:46  Job:  [id number]   cc:   Osvaldo Shipper. Spruill, M.D.  Fax: [phone]

## 2010-06-06 NOTE — Assessment & Plan Note (Signed)
[facility]                         GASTROENTEROLOGY OFFICE NOTE   NAME:[name], [name]                 MRN:          [id number]  DATE:04/19/2006                            DOB:          05/27/1960    PROBLEM:  Reflux.   Ms. [name] has returned for her annual visit.  From a GI standpoint  she has done quite well.  She has no GI complaints including dysphagia,  pyrosis, hoarseness, or coughing.  She is here for a medicine refill.  She continues to take Nexium 40 mg twice a day.   EXAMINATION:  Pulse 80, blood pressure 118/70, weight 233.   IMPRESSION:  Gastroesophageal reflux disease complicated by an  esophageal stricture - asymptomatic on proton pump inhibitor therapy.   RECOMMENDATIONS:  I instructed Patricia Reed to try reducing her Nexium  to once a day.  Failing that, she will go back to her twice-a-day dose.     Barbette Hair. Arlyce Dice, MD,FACG  Electronically Signed    RDK/MedQ  DD: 04/19/2006  DT: 04/19/2006  Job #: [id number]

## 2010-06-06 NOTE — Op Note (Signed)
NAME:  Patricia Reed, RUDE NO.:  [id number]   MEDICAL RECORD NO.:  [id number]          PATIENT TYPE:  OIB   LOCATION:  3012                         FACILITY:  [facility]   PHYSICIAN:  Cristi Loron, M.D.DATE OF BIRTH:  23-Dec-1960   DATE OF PROCEDURE:  02/08/2006  DATE OF DISCHARGE:                               OPERATIVE REPORT   BRIEF HISTORY:  The patient is a 50 year old black female who has  suffered from neck and right arm pain consistent with a right C6  radiculopathy.  She failed medical management and was worked up with a  cervical MRI, which demonstrated the patient had spondylosis at C5-6  bilaterally.  I discussed the various treatment options with the patient  including surgery.  The patient has weighed the risks, benefits and  alternatives to surgery and has opted to proceed with a C5-6 anterior  cervical diskectomy, fusion and plating.   PREOPERATIVE DIAGNOSES:  1. 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  2. Spondylosis.  3. Stenosis.  4. Cervicalgia.  5. Cervical radiculopathy.   POSTOPERATIVE DIAGNOSES:  1. 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  2. Spondylosis.  3. Stenosis.  4. Cervicalgia.  5. Cervical radiculopathy.   PROCEDURE:  C5-6 extensive anterior cervical diskectomy/decompression;  insertion of C5-6 interbody prosthesis (Alphatec medium 6-mm PEEK  interbody spacer); C5-6 interbody arthrodesis with local morcellized  autograft bone and Alphatec bone graft extender; C5-6 anterior cervical  instrumentation (Codman's Slim-Lock titanium plate and screws).   SURGEON:  Dr. Delma Officer.   ASSISTANT:  Dr. Shirlean Kelly.   ANESTHESIA:  General endotracheal.   ESTIMATED BLOOD LOSS:  250 mL.   SPECIMENS:  None.   DRAINS:  None.   COMPLICATIONS:  None.   DESCRIPTION OF PROCEDURE:  The patient was brought to the operating room  by the anesthesia team.  General endotracheal anesthesia was induced.  The patient remained in the supine position.   A roll was placed under  her shoulders; I placed her neck in slight extension.  Her anterior  cervical region was then prepared with Betadine scrub and Betadine  solution; sterile drapes were applied.  I then injected the area to be  incised with Marcaine with epinephrine solution, and used a scalpel to  make a transverse incision at the base of the left anterior neck.  I  used the Metzenbaum scissors to divide the platysma muscle and then to  dissect medial to the sternocleidomastoid muscle, jugular vein and  carotid artery.  I carefully dissected down towards the anterior  cervical spine.  I carefully identified the esophagus and retracted it  medially.  I then used the Kitner swabs to clear the soft tissue from  the anterior cervical spine and then we inserted a bent spinal needle  into the upper exposed intervertebral disk space.  We obtained  intraoperative radiograph to confirm our location.   We then used electrocautery to detach the medial border of the longus  coli muscle bilateral from the C5-6 intervertebral disk space.  We then  inserted the Caspar self-retaining retractor for exposure.  We incised  the C5-6 intervertebral disk and performed a partial diskectomy using  the pituitary forceps and the [facility] curets.  We then inserted  distraction screws into the C5 and C6 vertebral bodies and distracted  the C5-6 interspace.  We used a high speed drill to decorticate the  vertebral endplates of C5-6, to drill away the remainder of the C5-6  intervertebral disk, drill away some posterior spondylosis and to thin  out the poster longitudinal ligament.  We then incised the ligament with  the arachnoid knife and then removed it with the Kerrison punch,  undercutting the vertebral endplates of C5-6 and decompressing the  thecal sac.  We then performed foraminotomies about the bilateral C6  nerve root completing the decompression at this level.   We now turned our attention to the  arthrodesis.  We used the trial  spacers and determined to use a 6-mm medium Alphatec PEEK interbody  prosthesis.  We filled the interior of this prosthesis with a  combination of local morcellized autograft bone we obtained during the  decompression and Alphatec bone grafting center.  We then inserted the  end of our prosthesis into the distracted C5-6 interspace.  We then  removed the distraction screws and there was good snug fit of the  prosthesis in the interspace.   We now turned out attention to the anterior spinal instrumentation.  We  used the high speed drill to remove some ventral spondylosis from the C5-  6 intervertebral space so that the plate would lay down flat.  We  selected the appropriate length Codman Slim-Lock anterior cervical plate  and laid it along the anterior aspects of the vertebral bodies at C5-6.  We then drilled two 12-mm holes at C5 toward C6.  We secured the plate  to the vertebral body by placing two 12-mm self-tapping screws at C5  toward C6.  We then obtained intraoperative radiograph.  There was  limited visualization because of the patient's body habitus; however,  the plate, screws and the prosthesis looked good in vivo and we  therefore secured the screws to the plate by locking its cam.   We then obtained hemostasis using bipolar electrocautery.  We irrigated  the wound out with bacitracin solution, removed the retractor, inspected  the esophagus for any damage and there were none apparent.  We then  reapproximated the patient's platysma muscle with interrupted 3-0 Vicryl  suture, the subcutaneous tissue with interrupted 3-0 Vicryl suture and  the skin with Steri-Strips and benzoin.  The wound was then coated with  bacitracin ointment and a sterile dressing was applied.  The drapes were  removed and the patient was subsequently extubated by the anesthesia  team and transported to the post-anesthesia care unit in stable condition.  All sponge,  instrument and needle counts were correct at the  end of this case.      Cristi Loron, M.D.  Electronically Signed    JDJ/MEDQ  D:  02/08/2006  T:  02/08/2006  Job:  [id number]

## 2010-06-06 NOTE — Discharge Summary (Signed)
NAME:  ANGIE, PIERCEY NO.:  [id number]   MEDICAL RECORD NO.:  [id number]          PATIENT TYPE:  INP   LOCATION:  3704                         FACILITY:  [facility]   PHYSICIAN:  Osvaldo Shipper. Spruill, M.D.DATE OF BIRTH:  04-17-1960   DATE OF ADMISSION:  02/22/2005  DATE OF DISCHARGE:  02/25/2005                                 DISCHARGE SUMMARY   DISCHARGE DIAGNOSES:  1.  Asthma.  2.  Bronchitis.  3.  Reflux esophagitis.  4.  Obesity.   Ms. [name] is a 50 year old patient who presented initially to the  emergency department of the [facility] with complaints  of asthma and difficulty with her breathing.  Patient was treated in the  emergency room with multiple nebulizer treatments of Xopenex without  significant improvement.  Patient subsequently admitted for aggressive  treatment of this problem.  Patient had a chest x-ray which was negative for  pneumonia or other lung related problems.  The temperature was within normal  limits.  It was the opinion that the patient was having acute exacerbation  of her asthma.   The patient was initially admitted to 23-hour observation, however, after 24  hours of antibiotics and steroids, the patient was still wheezing rather  profusely and she was admitted to regular status.   The patient was seen on pulmonary consultation and it was their opinion that  this was indeed an acute exacerbation that was aggravated by GERD and  recommended that the steroids be increased, the patient be placed on high  dose proton pump inhibitors and continued the Advair for now and add nasal  steroid as well as five days of empiric antibiotics.   On February 25, 2005, Zelnorm was also added.  The patient was changed to  p.o. prednisone  and at this point, it was the opinion that the patient had  received maximum benefits of hospitalization and could be discharged home.  The patient was given a work note to be out of work from  February 22, 2005,  to March 01, 2005.   Patient is to be on prednisone in a tapering dose of 40 mg for four days, 30  mg for four days, 20 mg for four days and 10 mg for four days.  Patient is  also placed on Nexium two times a day.  Zelnorm two times a day.  Mucinex  two times a day.  Nasonex to times a day and Xopenex negative as needed.  Avelox 400 mg for five days.  Steroid nasal spray q.6h.   Patient is to be seen by Dr. Delford Field in the office on March 25, 2005, and to  be seen in Dr. Magda Kiel office in two weeks, sooner if any problems,  changes or concerns.      Ivery Quale, P.A.      Osvaldo Shipper. Spruill, M.D.  Electronically Signed    HB/MEDQ  D:  04/08/2005  T:  04/10/2005  Job:  [id number]

## 2010-06-08 ENCOUNTER — Emergency Department (HOSPITAL_COMMUNITY): Payer: BC Managed Care – PPO

## 2010-06-08 ENCOUNTER — Emergency Department (HOSPITAL_COMMUNITY)
Admission: EM | Admit: 2010-06-08 | Discharge: 2010-06-08 | Disposition: A | Discharge status: Home / Self Care | Payer: BC Managed Care – PPO | Attending: Emergency Medicine | Admitting: Emergency Medicine

## 2010-06-08 DIAGNOSIS — M62838 Other muscle spasm: Secondary | ICD-10-CM | POA: Insufficient documentation

## 2010-06-08 DIAGNOSIS — J45909 Unspecified asthma, uncomplicated: Secondary | ICD-10-CM | POA: Insufficient documentation

## 2010-06-08 DIAGNOSIS — N644 Mastodynia: Secondary | ICD-10-CM | POA: Insufficient documentation

## 2010-06-08 DIAGNOSIS — R209 Unspecified disturbances of skin sensation: Secondary | ICD-10-CM | POA: Insufficient documentation

## 2010-06-08 DIAGNOSIS — Z79899 Other long term (current) drug therapy: Secondary | ICD-10-CM | POA: Insufficient documentation

## 2010-06-08 DIAGNOSIS — K219 Gastro-esophageal reflux disease without esophagitis: Secondary | ICD-10-CM | POA: Insufficient documentation

## 2010-06-08 DIAGNOSIS — R071 Chest pain on breathing: Secondary | ICD-10-CM | POA: Insufficient documentation

## 2010-06-08 LAB — POCT I-STAT, CHEM 8
Calcium, Ion: 1.11 mmol/L — ABNORMAL LOW (ref 1.12–1.32)
HCT: 42 % (ref 36.0–46.0)
Hemoglobin: 14.3 g/dL (ref 12.0–15.0)
Sodium: 143 mEq/L (ref 135–145)
TCO2: 27 mmol/L (ref 0–100)

## 2010-06-08 LAB — POCT CARDIAC MARKERS: Myoglobin, poc: 53.9 ng/mL (ref 12–200)

## 2010-07-11 ENCOUNTER — Other Ambulatory Visit: Payer: Self-pay | Admitting: Gastroenterology

## 2010-07-11 MED ORDER — ESOMEPRAZOLE MAGNESIUM 40 MG PO CPDR: 40.0000 mg | DELAYED_RELEASE_CAPSULE | Freq: Every day | ORAL | Status: DC

## 2010-07-11 NOTE — Telephone Encounter (Signed)
Sent Nexium in to pts pharmacy

## 2010-07-14 ENCOUNTER — Other Ambulatory Visit: Payer: Self-pay | Admitting: Gastroenterology

## 2010-07-15 MED ORDER — ESOMEPRAZOLE MAGNESIUM 40 MG PO CPDR: 40.0000 mg | DELAYED_RELEASE_CAPSULE | Freq: Every day | ORAL | Status: DC

## 2010-07-15 NOTE — Telephone Encounter (Signed)
Medication sent to pts pharmacy 

## 2010-07-16 ENCOUNTER — Other Ambulatory Visit: Payer: Self-pay | Admitting: Gastroenterology

## 2010-07-17 MED ORDER — ESOMEPRAZOLE MAGNESIUM 40 MG PO CPDR: 40.0000 mg | DELAYED_RELEASE_CAPSULE | Freq: Two times a day (BID) | ORAL | Status: DC

## 2010-07-17 NOTE — Telephone Encounter (Signed)
Pt come get samples and sent in medication to her pharmacy

## 2010-10-30 LAB — DIFFERENTIAL
Lymphs Abs: 2.1
Monocytes Relative: 5
Neutro Abs: 3.6
Neutrophils Relative %: 59

## 2010-10-30 LAB — POCT CARDIAC MARKERS
CKMB, poc: <1 — ABNORMAL LOW
Myoglobin, poc: 83.3
Operator id: 151321

## 2010-10-30 LAB — I-STAT 8, (EC8 V) (CONVERTED LAB)
Bicarbonate: 28 — ABNORMAL HIGH
Glucose, Bld: 85
HCT: 46
Hemoglobin: 15.6 — ABNORMAL HIGH
Operator id: 151321
Sodium: 138
TCO2: 29
pCO2, Ven: 50.1 — ABNORMAL HIGH

## 2010-10-30 LAB — POCT I-STAT CREATININE: Operator id: 151321

## 2010-10-30 LAB — CBC
RBC: 4.71
WBC: 6.1

## 2011-02-18 ENCOUNTER — Other Ambulatory Visit: Payer: Self-pay | Admitting: Cardiology

## 2011-02-18 DIAGNOSIS — Z1231 Encounter for screening mammogram for malignant neoplasm of breast: Secondary | ICD-10-CM

## 2011-02-26 ENCOUNTER — Telehealth: Payer: Self-pay | Admitting: Gastroenterology

## 2011-02-26 NOTE — Telephone Encounter (Signed)
She needs both an office visit and an upper endoscopy with Botox. If there is urgency she can be scheduled first for the procedure.

## 2011-02-26 NOTE — Telephone Encounter (Signed)
Spoke with pt and she states she is having a lot of problems swallowing getting her food down. States she can get liquids down and oatmeal but is having problems with everything else. Reports that she is "beating her chest and holding her hands under her arms" to help the food go down. Dr. Arlyce Dice please advise.

## 2011-02-27 ENCOUNTER — Other Ambulatory Visit: Payer: Self-pay | Admitting: Gastroenterology

## 2011-02-27 NOTE — Telephone Encounter (Signed)
Pt scheduled for EGD with botox @[facility]  03/04/11 arrival time 11:30am for a 12:30pm appt. Case [id number]. Pt aware of appt date and time. Prep instructions mailed to pt.

## 2011-03-03 ENCOUNTER — Encounter (HOSPITAL_COMMUNITY): Payer: Self-pay

## 2011-03-04 ENCOUNTER — Encounter (HOSPITAL_COMMUNITY)
Admission: RE | Disposition: A | Discharge status: Home / Self Care | Payer: Self-pay | Source: Ambulatory Visit | Attending: Gastroenterology

## 2011-03-04 ENCOUNTER — Ambulatory Visit (HOSPITAL_COMMUNITY)
Admission: RE | Admit: 2011-03-04 | Discharge: 2011-03-04 | Disposition: A | Discharge status: Home / Self Care | Payer: BC Managed Care – PPO | Source: Ambulatory Visit | Attending: Gastroenterology | Admitting: Gastroenterology

## 2011-03-04 ENCOUNTER — Encounter (HOSPITAL_COMMUNITY): Payer: Self-pay | Admitting: Gastroenterology

## 2011-03-04 DIAGNOSIS — K224 Dyskinesia of esophagus: Secondary | ICD-10-CM | POA: Insufficient documentation

## 2011-03-04 DIAGNOSIS — R1310 Dysphagia, unspecified: Secondary | ICD-10-CM | POA: Insufficient documentation

## 2011-03-04 HISTORY — PX: ESOPHAGOGASTRODUODENOSCOPY: SHX5428

## 2011-03-04 HISTORY — DX: Cardiac murmur, unspecified: R01.1

## 2011-03-04 SURGERY — EGD (ESOPHAGOGASTRODUODENOSCOPY)
Anesthesia: Moderate Sedation

## 2011-03-04 SURGERY — Surgical Case
Anesthesia: *Unknown

## 2011-03-04 MED ORDER — SODIUM CHLORIDE 0.9 % IV SOLN
INTRAVENOUS | Status: DC
  Administered 2011-03-04: 500 mL via INTRAVENOUS

## 2011-03-04 MED ORDER — GLYCOPYRROLATE 0.2 MG/ML IJ SOLN
INTRAMUSCULAR | Status: DC | PRN
  Administered 2011-03-04: 0.2 mg via INTRAVENOUS

## 2011-03-04 MED ORDER — MIDAZOLAM HCL 10 MG/2ML IJ SOLN
INTRAMUSCULAR | Status: AC
  Filled 2011-03-04: qty 2

## 2011-03-04 MED ORDER — DIPHENHYDRAMINE HCL 50 MG/ML IJ SOLN
INTRAMUSCULAR | Status: AC
  Filled 2011-03-04: qty 1

## 2011-03-04 MED ORDER — MIDAZOLAM HCL 10 MG/2ML IJ SOLN
INTRAMUSCULAR | Status: DC | PRN
  Administered 2011-03-04 (×3): 2.5 mg via INTRAVENOUS

## 2011-03-04 MED ORDER — ONABOTULINUMTOXINA 100 UNITS IJ SOLR
100.0000 [IU] | INTRAMUSCULAR | Status: AC
  Administered 2011-03-04: 100 [IU] via INTRAMUSCULAR
  Filled 2011-03-04: qty 100

## 2011-03-04 MED ORDER — BUTAMBEN-TETRACAINE-BENZOCAINE 2-2-14 % EX AERO
INHALATION_SPRAY | CUTANEOUS | Status: DC | PRN
  Administered 2011-03-04: 2 via TOPICAL

## 2011-03-04 MED ORDER — DIPHENHYDRAMINE HCL 50 MG/ML IJ SOLN
INTRAMUSCULAR | Status: DC | PRN
  Administered 2011-03-04: 50 mg via INTRAVENOUS

## 2011-03-04 MED ORDER — GLYCOPYRROLATE 0.2 MG/ML IJ SOLN
INTRAMUSCULAR | Status: AC
  Filled 2011-03-04: qty 1

## 2011-03-04 MED ORDER — FENTANYL CITRATE 0.05 MG/ML IJ SOLN
INTRAMUSCULAR | Status: AC
  Filled 2011-03-04: qty 2

## 2011-03-04 MED ORDER — FENTANYL NICU IV SYRINGE 50 MCG/ML
INJECTION | INTRAMUSCULAR | Status: DC | PRN
  Administered 2011-03-04 (×3): 25 ug via INTRAVENOUS

## 2011-03-04 NOTE — Op Note (Signed)
[facility] [address] [place], [place]  [address]  ENDOSCOPY PROCEDURE REPORT  PATIENT:  Patricia Reed, Patricia Reed  MR#:  [id number] BIRTHDATE:  [birth year], 50 yrs. old  GENDER:  female  ENDOSCOPIST:  Barbette Hair. Arlyce Dice, MD Referred by:  Donia Guiles, M.D.  PROCEDURE DATE:  03/04/2011 PROCEDURE:  EGD w/botox injection ASA CLASS:  Class II INDICATIONS:  dysphagia  MEDICATIONS:   These medications were titrated to patient response per physician's verbal order, Fentanyl 75 mcg IV, Versed 7.5 mg IV, Benadryl 50 mg IV, glycopyrrolate (Robinal) 0.2 mg IV TOPICAL ANESTHETIC:  Cetacaine Spray  DESCRIPTION OF PROCEDURE:   After the risks and benefits of the procedure were explained, informed consent was obtained.  The endoscope was introduced through the mouth and advanced to the third portion of the duodenum.  The instrument was slowly withdrawn as the mucosa was fully examined. <<PROCEDUREIMAGES>>  The upper, middle, and distal third of the esophagus were carefully inspected and no abnormalities were noted. The z-line was well seen at the GEJ. The endoscope was pushed into the fundus which was normal including a retroflexed view. The antrum,gastric body, first and second part of the duodenum were unremarkable. botox injection The Z line was identified at 40 cm from the incisors. 25 units (1 cc) was injected in each quadrant at the GE junction, submucosally (see image1, image2, and image3). Retroflexed views revealed no abnormalities.    The scope was then withdrawn from the patient and the procedure completed.  COMPLICATIONS:  None  ENDOSCOPIC IMPRESSION: 1) Normal EGD -s/p botox injection RECOMMENDATIONS: Office visit 2-4 weks  ______________________________ Barbette Hair. Arlyce Dice, MD  CC:  n. eSIGNED:   Barbette Hair. Brexlee Heberlein at 03/04/2011 12:59 PM  Lars Mage, [phone]

## 2011-03-04 NOTE — H&P (Signed)
  History of Present Illness: This 51 year old Afro-American female has a history of an esophageal motility disorder causing dysphagia he has been treated in the past with Botox Results. Last endoscopy with Botox was greater than one year ago. She is complaining of recurrent dysphagia solids and liquids.    Past Medical History  Diagnosis Date  . Heart murmur   . Asthma   . Achalasia    Past Surgical History  Procedure Date  . Abdominal hysterectomy   . Cesarean section   . Cervical discectomy     plates and screws in neck  . Exc benign breast lump    family history is not on file. Current Facility-Administered Medications  Medication Dose Route Frequency Provider Last Rate Last Dose  . 0.9 %  sodium chloride infusion   Intravenous Continuous Louis Meckel, MD 20 mL/hr at 03/04/11 1201 500 mL at 03/04/11 1201  . botulinum toxin Type A (BOTOX) injection 100 Units  100 Units Intramuscular To Endo Louis Meckel, MD       Allergies as of 02/27/2011 - reviewed 10/04/2007  Allergen Reaction Noted  . Aspirin    . Hydrocodone-acetaminophen    . Ibuprofen    . Meperidine hcl    . Morphine    . Oxycodone-acetaminophen    . Procaine hcl      reports that she has never smoked. She does not have any smokeless tobacco history on file. She reports that she does not drink alcohol or use illicit drugs.     Review of Systems: Pertinent positive and negative review of systems were noted in the above HPI section. All other review of systems were otherwise negative.  Vital signs were reviewed in today's medical record Physical Exam: General: Well developed , well nourished, no acute distress Head: Normocephalic and atraumatic Eyes:  sclerae anicteric, EOMI Ears: Normal auditory acuity Mouth: No deformity or lesions Neck: Supple, no masses or thyromegaly Lungs: Clear throughout to auscultation Heart: Regular rate and rhythm; no murmurs, rubs or bruits Abdomen: Soft, non tender and  non distended. No masses, hepatosplenomegaly or hernias noted. Normal Bowel sounds Rectal:deferred Musculoskeletal: Symmetrical with no gross deformities  Skin: No lesions on visible extremities Pulses:  Normal pulses noted Extremities: No clubbing, cyanosis, edema or deformities noted Neurological: Alert oriented x 4, grossly nonfocal Cervical Nodes:  No significant cervical adenopathy Inguinal Nodes: No significant inguinal adenopathy Psychological:  Alert and cooperative. 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

## 2011-03-04 NOTE — Discharge Instructions (Signed)
Endoscopy °Care After °Please read the instructions outlined below and refer to this sheet in the next few weeks. These discharge instructions provide you with general information on caring for yourself after you leave the hospital. Your doctor may also give you specific instructions. While your treatment has been planned according to the most current medical practices available, unavoidable complications occasionally occur. If you have any problems or questions after discharge, please call your doctor. °HOME CARE INSTRUCTIONS °Activity °· You may resume your regular activity but move at a slower pace for the next 24 hours.  °· Take frequent rest periods for the next 24 hours.  °· Walking will help expel (get rid of) the air and reduce the bloated feeling in your abdomen.  °· No driving for 24 hours (because of the anesthesia (medicine) used during the test).  °· You may shower.  °· Do not sign any important legal documents or operate any machinery for 24 hours (because of the anesthesia used during the test).  °Nutrition °· Drink plenty of fluids.  °· You may resume your normal diet.  °· Begin with a light meal and progress to your normal diet.  °· Avoid alcoholic beverages for 24 hours or as instructed by your caregiver.  °Medications °You may resume your normal medications unless your caregiver tells you otherwise. °What you can expect today °· You may experience abdominal discomfort such as a feeling of fullness or "gas" pains.  °· You may experience a sore throat for 2 to 3 days. This is normal. Gargling with salt water may help this.  °Follow-up °Your doctor will discuss the results of your test with you. °SEEK IMMEDIATE MEDICAL CARE IF: °· You have excessive nausea (feeling sick to your stomach) and/or vomiting.  °· You have severe abdominal pain and distention (swelling).  °· You have trouble swallowing.  °· You have a temperature over 100° F (37.8° C).  °· You have rectal bleeding or vomiting of blood.    °Document Released: 08/20/2003 Document Revised: 09/17/2010 Document Reviewed: 03/02/2007 °[employer]® Patient Information ©2012 [employer]. °

## 2011-03-05 ENCOUNTER — Encounter (HOSPITAL_COMMUNITY): Payer: Self-pay | Admitting: Gastroenterology

## 2011-03-09 ENCOUNTER — Ambulatory Visit
Admission: RE | Admit: 2011-03-09 | Discharge: 2011-03-09 | Disposition: A | Discharge status: Home / Self Care | Payer: BC Managed Care – PPO | Source: Ambulatory Visit | Attending: Cardiology | Admitting: Cardiology

## 2011-03-09 DIAGNOSIS — Z1231 Encounter for screening mammogram for malignant neoplasm of breast: Secondary | ICD-10-CM

## 2011-03-31 ENCOUNTER — Ambulatory Visit: Payer: BC Managed Care – PPO | Admitting: Gastroenterology

## 2011-10-15 ENCOUNTER — Encounter (HOSPITAL_COMMUNITY): Payer: Self-pay | Admitting: *Deleted

## 2011-10-15 ENCOUNTER — Emergency Department (HOSPITAL_COMMUNITY)
Admission: EM | Admit: 2011-10-15 | Discharge: 2011-10-15 | Disposition: A | Discharge status: Home / Self Care | Payer: BC Managed Care – PPO | Source: Home / Self Care | Attending: Family Medicine | Admitting: Family Medicine

## 2011-10-15 DIAGNOSIS — H8110 Benign paroxysmal vertigo, unspecified ear: Secondary | ICD-10-CM

## 2011-10-15 HISTORY — DX: Dizziness and giddiness: R42

## 2011-10-15 MED ORDER — DIAZEPAM 5 MG PO TABS
5.0000 mg | ORAL_TABLET | Freq: Once | ORAL | Status: AC
  Administered 2011-10-15: 5 mg via ORAL

## 2011-10-15 MED ORDER — MECLIZINE HCL 25 MG PO TABS
25.0000 mg | ORAL_TABLET | Freq: Once | ORAL | Status: AC
  Administered 2011-10-15: 25 mg via ORAL

## 2011-10-15 MED ORDER — ONDANSETRON 4 MG PO TBDP
4.0000 mg | ORAL_TABLET | Freq: Once | ORAL | Status: AC
  Administered 2011-10-15: 4 mg via ORAL

## 2011-10-15 MED ORDER — ONDANSETRON HCL 4 MG PO TABS: 4.0000 mg | ORAL_TABLET | Freq: Four times a day (QID) | ORAL | Status: DC

## 2011-10-15 MED ORDER — MECLIZINE HCL 50 MG PO TABS: 50.0000 mg | ORAL_TABLET | Freq: Three times a day (TID) | ORAL | Status: DC | PRN

## 2011-10-15 MED ORDER — ONDANSETRON 4 MG PO TBDP
ORAL_TABLET | ORAL | Status: AC
  Filled 2011-10-15: qty 1

## 2011-10-15 NOTE — ED Provider Notes (Signed)
History     CSN: [id number]  Arrival date & time 10/15/11  1712   First MD Initiated Contact with Patient 10/15/11 1713      Chief Complaint  Patient presents with  . Dizziness  . Nausea    (Consider location/radiation/quality/duration/timing/severity/associated sxs/prior treatment) Patient is a 51 y.o. female presenting with neurologic complaint. The history is provided by the patient.  Neurologic Problem The primary symptoms include dizziness. Primary symptoms do not include headaches or syncope. The symptoms began 1 to 2 hours ago. The symptoms are unchanged. Context: went to store and suddenly got dizzy in store with spinning, no headache, sl nausea.  Dizziness does not occur with tinnitus or weakness.  Additional symptoms do not include weakness, hearing loss or tinnitus.    Past Medical History  Diagnosis Date  . Heart murmur   . Asthma   . Achalasia   . Vertigo     Past Surgical History  Procedure Date  . Abdominal hysterectomy   . Cesarean section   . Cervical discectomy     plates and screws in neck  . Exc benign breast lump   . Esophagogastroduodenoscopy 03/04/2011    Procedure: ESOPHAGOGASTRODUODENOSCOPY (EGD);  Surgeon: Louis Meckel, MD;  Location: [facility];  Service: Endoscopy;  Laterality: N/A;  BOTOX Injection    Family History  Problem Relation Age of Onset  . Family history unknown: Yes    History  Substance Use Topics  . Smoking status: Never Smoker   . Smokeless tobacco: Not on file  . Alcohol Use: No    OB History    Grav Para Term Preterm Abortions TAB SAB Ect Mult Living                  Review of Systems  Constitutional: Negative.   HENT: Negative.  Negative for hearing loss and tinnitus.   Eyes: Negative.   Cardiovascular: Negative for palpitations and syncope.  Neurological: Positive for dizziness. Negative for tremors, syncope, weakness and headaches.    Allergies  Aspirin; Hydrocodone-acetaminophen; Ibuprofen;  Meperidine hcl; Morphine; Oxycodone-acetaminophen; and Procaine hcl  Home Medications   Current Outpatient Rx  Name Route Sig Dispense Refill  . DEXLANSOPRAZOLE 30 MG PO CPDR Oral Take 30 mg by mouth 2 (two) times daily.    . AMOXICILLIN-POT CLAVULANATE 250-125 MG PO TABS Oral Take 1 tablet by mouth 2 (two) times daily with a meal.    . ESOMEPRAZOLE MAGNESIUM 40 MG PO CPDR Oral Take 1 capsule (40 mg total) by mouth 2 (two) times daily at 10 AM and 5 PM. 60 capsule 11  . LEVALBUTEROL TARTRATE 45 MCG/ACT IN AERO Inhalation Inhale 1-2 puffs into the lungs every 4 (four) hours as needed. As needed     . MECLIZINE HCL 50 MG PO TABS Oral Take 1 tablet (50 mg total) by mouth 3 (three) times daily as needed for dizziness. 30 tablet 0  . ONDANSETRON HCL 4 MG PO TABS Oral Take 1 tablet (4 mg total) by mouth every 6 (six) hours. 10 tablet 0    BP 151/105  Pulse 80  Temp 98.5 F (36.9 C) (Oral)  Resp 16  SpO2 100%  Physical Exam  Nursing note and vitals reviewed. Constitutional: She is oriented to person, place, and time. She appears well-developed and well-nourished.  HENT:  Head: Normocephalic.  Right Ear: External ear normal.  Left Ear: External ear normal.  Mouth/Throat: Oropharynx is clear and moist.  Eyes: Conjunctivae normal and EOM are normal.  Pupils are equal, round, and reactive to light.  Neck: Normal range of motion. Neck supple.  Cardiovascular: Normal rate, regular rhythm, normal heart sounds and intact distal pulses.   Musculoskeletal: She exhibits no edema.  Lymphadenopathy:    She has no cervical adenopathy.  Neurological: She is alert and oriented to person, place, and time. She has normal strength. No cranial nerve deficit or sensory deficit. Coordination normal.       No nystagmus. No ext weakness.  Skin: Skin is warm and dry.  Psychiatric: She has a normal mood and affect.    ED Course  Procedures (including critical care time)  Labs Reviewed - No data to  display No results found.   1. Benign positional vertigo       MDM  Sx of dizziness stable if sits still,meds have helped.        Linna Hoff, MD 10/15/11 1859

## 2011-10-15 NOTE — ED Notes (Signed)
Med will be ready for pick up from pharmacy in 15 min.

## 2011-10-15 NOTE — ED Notes (Signed)
Per pt " I feel dizzy, light headed and nausated. It feels worse when I lay down and close my eyes " hx of vertigo

## 2011-11-02 DIAGNOSIS — Z8711 Personal history of peptic ulcer disease: Secondary | ICD-10-CM | POA: Insufficient documentation

## 2011-11-02 DIAGNOSIS — D573 Sickle-cell trait: Secondary | ICD-10-CM | POA: Insufficient documentation

## 2011-11-02 DIAGNOSIS — J4520 Mild intermittent asthma, uncomplicated: Secondary | ICD-10-CM | POA: Insufficient documentation

## 2011-11-02 DIAGNOSIS — Z8719 Personal history of other diseases of the digestive system: Secondary | ICD-10-CM | POA: Insufficient documentation

## 2011-11-05 ENCOUNTER — Ambulatory Visit: Payer: Self-pay | Admitting: Obstetrics and Gynecology

## 2011-11-10 ENCOUNTER — Encounter (HOSPITAL_BASED_OUTPATIENT_CLINIC_OR_DEPARTMENT_OTHER)
Admission: RE | Disposition: A | Discharge status: Home / Self Care | Payer: Self-pay | Source: Ambulatory Visit | Attending: Cardiology

## 2011-11-10 ENCOUNTER — Inpatient Hospital Stay (HOSPITAL_BASED_OUTPATIENT_CLINIC_OR_DEPARTMENT_OTHER)
Admission: RE | Admit: 2011-11-10 | Discharge: 2011-11-10 | Disposition: A | Discharge status: Home / Self Care | Payer: BC Managed Care – PPO | Source: Ambulatory Visit | Attending: Cardiology | Admitting: Cardiology

## 2011-11-10 DIAGNOSIS — I10 Essential (primary) hypertension: Secondary | ICD-10-CM | POA: Insufficient documentation

## 2011-11-10 DIAGNOSIS — E7800 Pure hypercholesterolemia, unspecified: Secondary | ICD-10-CM | POA: Insufficient documentation

## 2011-11-10 DIAGNOSIS — K219 Gastro-esophageal reflux disease without esophagitis: Secondary | ICD-10-CM | POA: Insufficient documentation

## 2011-11-10 DIAGNOSIS — I2510 Atherosclerotic heart disease of native coronary artery without angina pectoris: Secondary | ICD-10-CM | POA: Insufficient documentation

## 2011-11-10 DIAGNOSIS — I252 Old myocardial infarction: Secondary | ICD-10-CM | POA: Insufficient documentation

## 2011-11-10 SURGERY — JV LEFT HEART CATHETERIZATION WITH CORONARY ANGIOGRAM
Anesthesia: Moderate Sedation

## 2011-11-10 MED ORDER — SODIUM CHLORIDE 0.9 % IV SOLN: INTRAVENOUS | Status: DC

## 2011-11-10 MED ORDER — ACETAMINOPHEN 325 MG PO TABS: 650.0000 mg | ORAL_TABLET | ORAL | Status: DC | PRN

## 2011-11-10 MED ORDER — ONDANSETRON HCL 4 MG/2ML IJ SOLN: 4.0000 mg | Freq: Four times a day (QID) | INTRAMUSCULAR | Status: DC | PRN

## 2011-11-10 MED ORDER — DIAZEPAM 5 MG PO TABS
5.0000 mg | ORAL_TABLET | Freq: Once | ORAL | Status: AC
  Administered 2011-11-10: 5 mg via ORAL

## 2011-11-10 MED ORDER — SODIUM CHLORIDE 0.9 % IV SOLN: INTRAVENOUS | Status: AC

## 2011-11-10 NOTE — OR Nursing (Signed)
Tegaderm dressing applied, site level 0, bedrest begins at 0845 

## 2011-11-10 NOTE — CV Procedure (Signed)
Left cardiac cath report dictated on 11/10/2011 dictation number is [id number]

## 2011-11-10 NOTE — H&P (Signed)
  Handwritten H&P in the chart 

## 2011-11-10 NOTE — Cardiovascular Report (Signed)
NAMELAWAN, NANEZ            ACCOUNT NO.:  [id number]  MEDICAL RECORD NO.:  [id number]  LOCATION:                                 FACILITY:  PHYSICIAN:  Latara Micheli N. Sharyn Lull, M.D. DATE OF BIRTH:  1961/01/16  DATE OF PROCEDURE:  11/10/2011 DATE OF DISCHARGE:                           CARDIAC CATHETERIZATION   PROCEDURE:  Left cardiac catheterization with selective left and right coronary angiography, left ventriculography via right groin using Judkins technique.  INDICATION FOR THE PROCEDURE:  Ms. Barbuto is a 51 year old black female with past medical history significant for hypertension, CAD, possible inferior wall myocardial infarction in the past, hypercholesterolemia, GERD, morbid obesity, history of peptic ulcer disease, history of bronchial asthma complains of retrosternal and left-sided chest pain radiating to the left side of the neck associated with feeling weak, tired, fatigued, and no energy.  She states had chest pain few weeks ago associated with diaphoresis, did not seek medical attention.  EKG done in the office showed normal sinus rhythm with LVH with possible inferior wall MI.  Denies any palpitation, lightheadedness, or syncope.  Denies PND, orthopnea, or leg swelling.  Denies relation of chest pain to food, breathing, or movement.  PAST MEDICAL HISTORY:  As above.  PAST SURGICAL HISTORY:  She had C-section in the past, had cervical disk surgery in the past.  ALLERGIES:  She is allergic to QUESTIONABLE NARCOTICS.  MEDICATIONS AT HOME:  She is on aspirin, Toprol, Crestor, Nitrostat, Nexium.  SOCIAL HISTORY:  She is married and has 4 children.  No history of smoking or alcohol abuse.  Worked as an Tourist information centre manager, born and raised in [place], lives in [place].  FAMILY HISTORY:  Father died of massive MI at the age of 57.  Mother is alive.  One sister is alive in good health.  PHYSICAL EXAMINATION:  GENERAL:  She is alert, awake, oriented  x3. VITAL SIGNS:  Blood pressure was 150/90, pulse was 72 and regular. EYES:  Conjunctivae was pink. NECK:  Supple.  No JVD.  No bruit. LUNGS:  Clear to auscultation without rhonchi or rales. CARDIOVASCULAR:  S1, S2 was normal.  There was soft systolic murmur. ABDOMEN:  Soft, obese.  Bowel sounds were present.  Nontender. EXTREMITIES:  No clubbing, cyanosis, or edema.  ADMISSION IMPRESSION:  New-onset angina, rule out coronary insufficiency, coronary artery disease, possible silent inferior wall myocardial infarction in the past, hypertension, history of peptic ulcer disease, history of bronchial asthma, gastroesophageal reflux disease, morbid obesity, hypercholesteremia, positive family history of coronary artery disease.  Discussed with the patient regarding noninvasive stress testing versus left cath, its risks and benefits, i.e., death, MI, stroke, need for emergency CABG, local vascular complications, etc. and consented for invasive left cath.  PROCEDURE:  After obtaining the informed consent, the patient was brought to the cath lab and was placed on fluoroscopy table.  Right groin was prepped and draped in usual fashion.  Xylocaine 1% was used for local anesthesia in the right groin.  With the help of thin wall needle, a 4-French arterial sheath was placed.  The sheath was aspirated and flushed.  Next, 4-French left Judkins catheter was advanced over the wire under fluoroscopic guidance up  to the ascending aorta.  Wire was pulled out, the catheter was aspirated and connected to the Manifold. Catheter was further advanced and engaged into left coronary ostium. Multiple views of the left system were taken.  Next, the catheter was disengaged and was pulled out over the wire and was replaced with 4- [place] 3D right diagnostic catheter which was advanced over the wire under fluoroscopic guidance up to the ascending aorta.  Wire was pulled out, the catheter was aspirated and connected  to the Manifold.  Catheter was further advanced and engaged into right coronary ostium.  Multiple views of the right system were taken.  Next, the catheter was disengaged and was pulled out over the wire and was replaced with 4-French pigtail catheter which was advanced over the wire under fluoroscopic guidance up to the ascending aorta.  Wire was pulled out, the catheter was aspirated and connected to the Manifold.  Catheter was further advanced across the aortic valve into the LV.  LV pressures were recorded.  Next, LV graphy was done in 30-degree RAO position.  Post angiographic pressures were recorded from LV and then pullback pressures were recorded from the aorta.  There was no gradient across the aortic valve.  Next, the pigtail catheter was pulled out over the wire.  Sheaths were aspirated and flushed.  FINDINGS:  LV showed mild LVH, good LV systolic function, EF of 55 to 60%.  The left main was patent.  LAD was patent.  Diagonal 1 and 2 were very small.  Diagonal 3 and 4 were very small.  Left circumflex was patent.  OM 1 was very small.  OM 2 was moderate size, which was patent. RCA was patent.  The PDA and PLV branches were small, which were patent. The patient tolerated the procedure well.  There were no complications. The patient was transferred to recovery room in stable condition.     Eduardo Osier. Sharyn Lull, M.D.     MNH/MEDQ  D:  11/10/2011  T:  11/10/2011  Job:  [id number]

## 2011-11-10 NOTE — OR Nursing (Signed)
Dr Harwani at bedside to discuss results and treatment plan with pt and family 

## 2012-02-04 ENCOUNTER — Encounter: Payer: Self-pay | Admitting: Cardiology

## 2012-02-04 ENCOUNTER — Ambulatory Visit (INDEPENDENT_AMBULATORY_CARE_PROVIDER_SITE_OTHER): Payer: BC Managed Care – PPO | Admitting: Cardiology

## 2012-02-04 VITALS — BP 147/96 | HR 69 | Ht 65.5 in | Wt 228.0 lb

## 2012-02-04 DIAGNOSIS — R079 Chest pain, unspecified: Secondary | ICD-10-CM

## 2012-02-04 DIAGNOSIS — Z7689 Persons encountering health services in other specified circumstances: Secondary | ICD-10-CM

## 2012-02-04 DIAGNOSIS — Z7189 Other specified counseling: Secondary | ICD-10-CM

## 2012-02-04 NOTE — Patient Instructions (Addendum)
OK to stop medications  Please establish with primary at  [facility]  Follow up as needed.

## 2012-02-04 NOTE — Progress Notes (Signed)
HPI The patient presents for evaluation of chest discomfort. In October she had an episode of dizziness and diaphoresis. She actually presented to urgent care. The etiology was not apparently clear. She saw a new physician as her primary care. He was a cardiologist. In December when he saw her and saw her EKG for the first time he thought she might have had a previous MI. He performed a cardiac catheterization which demonstrated normal coronaries. She was sent home on beta blockers, aspirin and Imdur. She's continued to have some chest discomfort that happens sporadically. She actually took a nitroglycerin recently. However, this is mild. It is not associated with activities. She doesn't describe associated symptoms such as nausea vomiting or diaphoresis. She has had some facial tingling and discomfort on the left side radiating to her neck. She does have a history of reflux and has required esophageal stretching. She also has had cervical disc surgery. She otherwise has been active and she denies any PND or orthopnea. She's had no new palpitations, presyncope or syncope.  Allergies  Allergen Reactions  . Aspirin   . Hydrocodone-Acetaminophen   . Ibuprofen   . Imdur (Isosorbide)   . Meperidine Hcl   . Morphine     REACTION: vomiting, hives  . Oxycodone-Acetaminophen   . Procaine Hcl   . Toprol Xl (Metoprolol)     Current Outpatient Prescriptions  Medication Sig Dispense Refill  . esomeprazole (NEXIUM) 40 MG capsule Take 40 mg by mouth daily before breakfast.      . levalbuterol (XOPENEX HFA) 45 MCG/ACT inhaler Inhale 1-2 puffs into the lungs every 4 (four) hours as needed. As needed       . nitroGLYCERIN (NITROSTAT) 0.4 MG SL tablet Place 0.4 mg under the tongue every 5 (five) minutes as needed. As needed        Past Medical History  Diagnosis Date  . Asthma   . Achalasia   . Vertigo   . History of stomach ulcers   . GERD (gastroesophageal reflux disease)   . HTN (hypertension)      Past Surgical History  Procedure Date  . Abdominal hysterectomy   . Cesarean section   . Cervical discectomy     plates and screws in neck  . Exc benign breast lump   . Esophagogastroduodenoscopy 03/04/2011    Procedure: ESOPHAGOGASTRODUODENOSCOPY (EGD);  Surgeon: Louis Meckel, MD;  Location: [facility];  Service: Endoscopy;  Laterality: N/A;  BOTOX Injection  . Tonsillectomy     No family history on file.  History   Social History  . Marital Status: Married    Spouse Name: N/A    Number of Children: 4  . 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 Smoking status: Never Smoker   . Smokeless tobacco: Never Used  . Alcohol Use: No  . Drug Use: No  . Sexually Active: Yes    Birth Control/ Protection: None, Surgical     Comment: LAVH   Other Topics Concern  . Not on file   Social History Narrative  . No narrative on file    ROS:  Positive for reflux, asthma, nausea, vertigo. Otherwise as stated in the history of present illness and negative for all other systems.  02/04/2012  PHYSICAL EXAM BP 147/96  Pulse 69  Ht 5' 5.5" (1.664 m)  Wt 228 lb (103.42 kg)  BMI 37.36 kg/m2 GENERAL:  Well  appearing HEENT:  Pupils equal round and reactive, fundi not visualized, oral mucosa unremarkable NECK:  No jugular venous distention, waveform within normal limits, carotid upstroke brisk and symmetric, no bruits, no thyromegaly LYMPHATICS:  No cervical, inguinal adenopathy LUNGS:  Clear to auscultation bilaterally BACK:  No CVA tenderness CHEST:  Unremarkable HEART:  PMI not displaced or sustained,S1 and S2 within normal limits, no S3, no S4, no clicks, no rubs, no murmurs ABD:  Flat, positive bowel sounds normal in frequency in pitch, no bruits, no rebound, no guarding, no midline pulsatile mass, no hepatomegaly, no splenomegaly EXT:  2 plus pulses throughout, no edema, no cyanosis no  clubbing SKIN:  No rashes no nodules NEURO:  Cranial nerves II through XII grossly intact, motor grossly intact throughout PSYCH:  Cognitively intact, oriented to person place and time  EKG:  Normal sinus rhythm, rate 64, axis within normal limits, intervals within normal limits, no acute ST-T wave changes.  ASSESSMENT AND PLAN  Chest discomfort - She has normal coronary arteries. This is a nonanginal chest pain. I thought possibly GI and suggested she followup with Dr. Arlyce Dice  Neck discomfort - I think the numbness and neck discomfort that she describes with most likely be neurologic. She has had neck surgery before and I suggested she followup with her neurosurgeon.  Hypertension - We had a long discussion and she would very much like to try to treat this with TLC (Therapeutic Lifestyle Changes) with weight loss and exercise. She's going to keep a blood pressure diary. She will be stopping her low dose beta blocker and nitrates. If her blood pressure remains elevated she'll let her primary care or me know if she would likely need therapy.  Overweight - The patient understands the need to lose weight with diet and exercise. We have discussed specific strategies for this.

## 2012-02-10 ENCOUNTER — Other Ambulatory Visit: Payer: Self-pay | Admitting: Family Medicine

## 2012-02-10 DIAGNOSIS — Z1231 Encounter for screening mammogram for malignant neoplasm of breast: Secondary | ICD-10-CM

## 2012-02-23 ENCOUNTER — Encounter: Payer: Self-pay | Admitting: Cardiology

## 2012-04-08 ENCOUNTER — Ambulatory Visit
Admission: RE | Admit: 2012-04-08 | Discharge: 2012-04-08 | Disposition: A | Discharge status: Home / Self Care | Payer: BC Managed Care – PPO | Source: Ambulatory Visit | Attending: Family Medicine | Admitting: Family Medicine

## 2012-04-08 ENCOUNTER — Ambulatory Visit: Payer: BC Managed Care – PPO | Admitting: Family Medicine

## 2012-04-08 DIAGNOSIS — Z1231 Encounter for screening mammogram for malignant neoplasm of breast: Secondary | ICD-10-CM

## 2012-04-11 ENCOUNTER — Encounter: Payer: Self-pay | Admitting: *Deleted

## 2012-05-09 ENCOUNTER — Telehealth: Payer: Self-pay | Admitting: Cardiology

## 2012-05-09 DIAGNOSIS — R200 Anesthesia of skin: Secondary | ICD-10-CM

## 2012-05-09 DIAGNOSIS — M542 Cervicalgia: Secondary | ICD-10-CM

## 2012-05-09 NOTE — Telephone Encounter (Signed)
Per pt - states she was to have followed up with her neurosurgeon but they won't give her an appointment because she owes them $1,100.  They will not see her until she pays the entire balance.  She has been making payments per her report of $80 per month.  She will be referred to follow up with neuro.  Pt aware someone will call her with an appointment.

## 2012-05-09 NOTE — Telephone Encounter (Signed)
Follow up     Referral to neuro having hard time getting an appt

## 2012-05-09 NOTE — Telephone Encounter (Signed)
New problem    Pt stated Dr Antoine Poche referred her to Dr Lovell Sheehan and pt is having hard time getting in to see him. Please call pt.

## 2012-05-10 ENCOUNTER — Emergency Department (HOSPITAL_COMMUNITY): Payer: BC Managed Care – PPO

## 2012-05-10 ENCOUNTER — Encounter (HOSPITAL_COMMUNITY): Payer: Self-pay | Admitting: *Deleted

## 2012-05-10 ENCOUNTER — Emergency Department (HOSPITAL_COMMUNITY)
Admission: EM | Admit: 2012-05-10 | Discharge: 2012-05-10 | Disposition: A | Discharge status: Home / Self Care | Payer: BC Managed Care – PPO | Attending: Emergency Medicine | Admitting: Emergency Medicine

## 2012-05-10 DIAGNOSIS — Z8711 Personal history of peptic ulcer disease: Secondary | ICD-10-CM | POA: Insufficient documentation

## 2012-05-10 DIAGNOSIS — I10 Essential (primary) hypertension: Secondary | ICD-10-CM | POA: Insufficient documentation

## 2012-05-10 DIAGNOSIS — M542 Cervicalgia: Secondary | ICD-10-CM | POA: Insufficient documentation

## 2012-05-10 DIAGNOSIS — Z79899 Other long term (current) drug therapy: Secondary | ICD-10-CM | POA: Insufficient documentation

## 2012-05-10 DIAGNOSIS — Z9089 Acquired absence of other organs: Secondary | ICD-10-CM | POA: Insufficient documentation

## 2012-05-10 DIAGNOSIS — R51 Headache: Secondary | ICD-10-CM | POA: Insufficient documentation

## 2012-05-10 DIAGNOSIS — J45909 Unspecified asthma, uncomplicated: Secondary | ICD-10-CM | POA: Insufficient documentation

## 2012-05-10 DIAGNOSIS — R209 Unspecified disturbances of skin sensation: Secondary | ICD-10-CM | POA: Insufficient documentation

## 2012-05-10 DIAGNOSIS — Z8719 Personal history of other diseases of the digestive system: Secondary | ICD-10-CM | POA: Insufficient documentation

## 2012-05-10 MED ORDER — ONDANSETRON HCL 4 MG PO TABS: 4.0000 mg | ORAL_TABLET | Freq: Three times a day (TID) | ORAL | Status: DC | PRN

## 2012-05-10 MED ORDER — GADOBENATE DIMEGLUMINE 529 MG/ML IV SOLN
20.0000 mL | Freq: Once | INTRAVENOUS | Status: AC
  Administered 2012-05-10: 20 mL via INTRAVENOUS

## 2012-05-10 MED ORDER — HYDROCODONE-ACETAMINOPHEN 5-325 MG PO TABS: 1.0000 | ORAL_TABLET | Freq: Four times a day (QID) | ORAL | Status: DC | PRN

## 2012-05-10 MED ORDER — ONDANSETRON HCL 8 MG PO TABS
4.0000 mg | ORAL_TABLET | Freq: Once | ORAL | Status: AC
  Administered 2012-05-10: 4 mg via ORAL
  Filled 2012-05-10: qty 1

## 2012-05-10 MED ORDER — DIPHENHYDRAMINE HCL 25 MG PO TABS: 25.0000 mg | ORAL_TABLET | Freq: Four times a day (QID) | ORAL | Status: DC | PRN

## 2012-05-10 MED ORDER — HYDROCODONE-ACETAMINOPHEN 5-325 MG PO TABS
1.0000 | ORAL_TABLET | Freq: Once | ORAL | Status: AC
  Administered 2012-05-10: 1 via ORAL
  Filled 2012-05-10: qty 1

## 2012-05-10 MED ORDER — DIPHENHYDRAMINE HCL 25 MG PO CAPS
25.0000 mg | ORAL_CAPSULE | Freq: Once | ORAL | Status: AC
  Administered 2012-05-10: 25 mg via ORAL
  Filled 2012-05-10: qty 1

## 2012-05-10 NOTE — ED Provider Notes (Signed)
Medical screening examination/treatment/procedure(s) were conducted as a shared visit with non-physician practitioner(s) and myself.  I personally evaluated the patient during the encounter  Pt with normal neuro exam and notes increased weakness x 2 months--unable to f/u her neurosurg, will order mri  Toy Baker, MD 05/10/12 [phone]

## 2012-05-10 NOTE — ED Notes (Signed)
Pt states she had neck surgery in 2008 and has started to have a return of some of the symptoms.  Pt reports neck pain in the center of her neck without any trauma, pt reports a decrease in the use of her arms, but has equal but weak grip strength.

## 2012-05-10 NOTE — ED Provider Notes (Signed)
History     CSN: [id number]  Arrival date & time 05/10/12  0546   First MD Initiated Contact with Patient 05/10/12 [phone]      Chief Complaint  Patient presents with  . Neck Pain    (Consider location/radiation/quality/duration/timing/severity/associated sxs/prior treatment) HPI  Patient is a 52 yo F s/p cervical discectomy (2008) presenting for two months of worsening posterior cervical neck throbbing pain with radiation down bilateral arms. Pt states this pain feels the same way prior to her needing to undergo the cervical discectomy. Pt is having some associated swelling and numbness in bilateral wrists w/ increased difficulty doing activities such as opening jars and writing. Pain is 10/10. Tylenol no longer relieves pain. Sleeping and activities involving her arms worsen her pain. Pt has not followed up with her spine surgeon or her PCP regarding this pain.  Patient denies fevers, chills, nausea, vomiting, or diarrhea.   Past Medical History  Diagnosis Date  . Asthma   . Achalasia   . Vertigo   . History of stomach ulcers   . GERD (gastroesophageal reflux disease)   . HTN (hypertension)     Past Surgical History  Procedure Laterality Date  . Abdominal hysterectomy    . Cesarean section    . Cervical discectomy      plates and screws in neck  . Exc benign breast lump    . Esophagogastroduodenoscopy  03/04/2011    Procedure: ESOPHAGOGASTRODUODENOSCOPY (EGD);  Surgeon: Louis Meckel, MD;  Location: [facility];  Service: Endoscopy;  Laterality: N/A;  BOTOX Injection  . Tonsillectomy      History reviewed. No pertinent family history.  History  Substance Use Topics  . Smoking status: Never Smoker   . Smokeless tobacco: Never Used  . Alcohol Use: No    OB History   Grav Para Term Preterm Abortions TAB SAB Ect Mult Living   5 3 3              Review of Systems  Constitutional: Negative for fever and chills.  HENT: Positive for neck pain.   Eyes: Negative.    Respiratory: Negative.   Cardiovascular: Negative.   Gastrointestinal: Negative.   Genitourinary: Negative.   Musculoskeletal: Negative for gait problem.  Skin: Negative.   Neurological: Positive for headaches.    Allergies  Aspirin; Hydrocodone-acetaminophen; Ibuprofen; Imdur; Meperidine hcl; Morphine; Oxycodone-acetaminophen; Procaine hcl; and Toprol xl  Home Medications   Current Outpatient Rx  Name  Route  Sig  Dispense  Refill  . acetaminophen (TYLENOL) 500 MG tablet   Oral   Take 1,000-1,500 mg by mouth 2 (two) times daily as needed for pain.         Marland Kitchen albuterol (PROVENTIL HFA;VENTOLIN HFA) 108 (90 BASE) MCG/ACT inhaler   Inhalation   Inhale 2 puffs into the lungs every 6 (six) hours as needed for wheezing or shortness of breath.         . levalbuterol (XOPENEX HFA) 45 MCG/ACT inhaler   Inhalation   Inhale 1-2 puffs into the lungs every 4 (four) hours as needed for wheezing or shortness of breath.            BP 146/105  Pulse 82  Temp(Src) 97.8 F (36.6 C) (Oral)  Resp 16  SpO2 99%  Physical Exam  Constitutional: She is oriented to person, place, and time. She appears well-developed and well-nourished.  HENT:  Head: Normocephalic and atraumatic.  Mouth/Throat: Oropharynx is clear and moist.  Eyes: Conjunctivae and EOM are  normal. Pupils are equal, round, and reactive to light.  Neck: Neck supple.  Cardiovascular: Normal rate, regular rhythm and normal heart sounds.   Pulses:      Radial pulses are 2+ on the right side, and 2+ on the left side.  Cap Refill < 2 sec  Pulmonary/Chest: Effort normal and breath sounds normal. No respiratory distress. She has no wheezes.  Abdominal: Soft.  Musculoskeletal:       Right shoulder: She exhibits tenderness.       Left shoulder: She exhibits tenderness.       Right elbow: She exhibits normal range of motion, no swelling and no deformity. No tenderness found.       Left elbow: She exhibits normal range of motion,  no swelling and no deformity. No tenderness found.       Right wrist: She exhibits normal range of motion and no swelling.       Left wrist: She exhibits normal range of motion and no swelling.       Cervical back: She exhibits decreased range of motion, tenderness, bony tenderness and pain. She exhibits no swelling, no edema and no deformity.       Right upper arm: She exhibits no swelling.       Left upper arm: She exhibits no swelling.       Right forearm: She exhibits no swelling.       Left forearm: She exhibits no swelling.  Neurological: She is alert and oriented to person, place, and time.  Skin: Skin is warm and dry.    ED Course  Procedures (including critical care time)  Medications  ondansetron (ZOFRAN) tablet 4 mg (4 mg Oral Given 05/10/12 0742)  diphenhydrAMINE (BENADRYL) capsule 25 mg (25 mg Oral Given 05/10/12 0742)  HYDROcodone-acetaminophen (NORCO/VICODIN) 5-325 MG per tablet 1 tablet (1 tablet Oral Given 05/10/12 0801)  gadobenate dimeglumine (MULTIHANCE) injection 20 mL (20 mLs Intravenous Contrast Given 05/10/12 0857)     Labs Reviewed - No data to display Mr Cervical Spine W Wo Contrast  05/10/2012  *RADIOLOGY REPORT*  Clinical Data: Neck pain with bilateral hand numbness and weakness  MRI CERVICAL SPINE WITHOUT AND WITH CONTRAST  Technique:  Multiplanar and multiecho pulse sequences of the cervical spine, to include the craniocervical junction and cervicothoracic junction, were obtained according to standard protocol without and with intravenous contrast.  Contrast: 20mL MULTIHANCE GADOBENATE DIMEGLUMINE 529 MG/ML IV SOLN  Comparison: Cervical MRI 04/30/2007  Findings: Solid mass lesion right thyroid measuring 25 x 30 mm. This was present on the prior study but direct comparison is not possible due to different sequences.  Left thyroid lobe is normal. Thyroid ultrasound possible biopsy is suggested to  rule out neoplasm.  Straightening of the cervical spine.  Fusion    C5-6.  Negative for fracture or mass lesion.  No evidence of infection.  No cord compression.  Spinal cord signal is normal.  C2-3:  Tiny central disc protrusion  C3-4:  Tiny central disc protrusion.  C4-5:  Small diffuse disc protrusion with mild spinal stenosis  C5-6:  Solid interbody fusion without stenosis  C6-7:  Shallow broad-based disc protrusion and spurring, similar to the prior study.  C7-C1:  Negative  IMPRESSION: 25 x 30 mm mass right thyroid lobe.  Recommend thyroid ultrasound and possible biopsy to rule out neoplasm.  Goiter also possible.  Satisfactory fusion C5-6 without stenosis.  Broad-based disc protrusion C6-7 is unchanged from the prior study.   Original Report Authenticated  By: Janeece Riggers, M.D.      1. Neck pain       MDM  Patient is a 52 year old female past medical history includes a cervical discectomy in 2008 presenting for worsening neck pain over the last 2 months. Patient states this pain feels like it did prior to her undergoing the cervical discectomy with a worsening pain and associated numbness and swelling in her hands. Physical exam was unremarkable except for bilateral upper extremity 4-5 strength and tenderness to palpation of the cervical spine, range of motion of C-spine was intact along with BIOM of upper extremities. MR of the cervical spine was done and did not note any changes from prior study done after surgery, only note was a mass on the right thyroid lobe. Patient was advised of MR findings and advised to followup with her spine surgeon to discuss worsening or returning symptoms she has not done that yet. The patient was also made aware of the thyroid nodule that would require further investigation to her primary care physician. Patient verbalized understanding and was agreeable to plan. Pain was managed in a hospital. Patient advised for symptomatic care and given pain prescription. The case is discussed with Dr. Freida Busman who agrees with plan. Patient stable at  time of discharge.       Jeannetta Ellis, PA-C 05/10/12 1551

## 2012-05-12 NOTE — ED Provider Notes (Signed)
Medical screening examination/treatment/procedure(s) were conducted as a shared visit with non-physician practitioner(s) and myself.  I personally evaluated the patient during the encounter  Jadzia Ibsen T Wilsie Kern, MD 05/12/12 1608 

## 2012-05-23 ENCOUNTER — Ambulatory Visit: Payer: BC Managed Care – PPO | Admitting: Neurology

## 2012-06-08 ENCOUNTER — Ambulatory Visit (INDEPENDENT_AMBULATORY_CARE_PROVIDER_SITE_OTHER): Payer: BC Managed Care – PPO | Admitting: Neurology

## 2012-06-08 ENCOUNTER — Encounter: Payer: Self-pay | Admitting: Neurology

## 2012-06-08 ENCOUNTER — Other Ambulatory Visit: Payer: Self-pay | Admitting: Neurology

## 2012-06-08 VITALS — BP 134/80 | HR 80 | Temp 97.7°F | Resp 12 | Ht 65.5 in | Wt 226.0 lb

## 2012-06-08 DIAGNOSIS — M129 Arthropathy, unspecified: Secondary | ICD-10-CM

## 2012-06-08 DIAGNOSIS — M130 Polyarthritis, unspecified: Secondary | ICD-10-CM

## 2012-06-08 LAB — RHEUMATOID FACTOR: Rhuematoid fact SerPl-aCnc: 19 IU/mL — ABNORMAL HIGH (ref <=14)

## 2012-06-08 LAB — SEDIMENTATION RATE: Sed Rate: 25 mm/hr — ABNORMAL HIGH (ref 0–22)

## 2012-06-08 NOTE — Patient Instructions (Addendum)
We will refer you to a rheumatologist.  Please have lab work today.

## 2012-06-08 NOTE — Progress Notes (Signed)
Patricia Reed is a 52 year old woman with a history of cervical fusion in 2008 or 9 possibly with Dr. Lovell Sheehan and also a history of stomach ulcers.  She states that about 4 months ago she developed pain in her joints in multiple areas worse on the right than the left.  Over the past few months the pain has just gotten progressively worse in a subacute fashion.  She does notice swelling but not redness or much heat.  This involves the MCP joints, the interphalangeal joints, the wrists particularly as well as the elbows and not so much the shoulders.  In the lower extremities in both ankles and the knees the most.  She now has to wake up and take 2 Tylenol and weight 45 minutes before she can get up and get dressed.  It is hard to hold onto things that require any grip and walking has become painful.  She does have a family history of rheumatoid arthritis in her grandmother and perhaps an aunt or uncle, she states.  She also gets pain in her stomach and when she is upset her stomach feels uncomfortable.  She had an ulcer in the past and she avoids aspirin or Motrin.  She is taking Tylenol with limited relief.  Review of systems is negative for headaches, numbness of the extremities, dizziness.  She does have a history of thyroid goiter or nodule, noted on her most recent MRI scan of the neck which also shows the previous fusion as well as mild cervical stenosis above the fusion and some disc bulging below the fusion.  Remainder of review of systems is negative.  Past Medical History  Diagnosis Date  . Asthma   . Achalasia   . Vertigo   . History of stomach ulcers   . GERD (gastroesophageal reflux disease)   . HTN (hypertension)     Current Outpatient Prescriptions on File Prior to Visit  Medication Sig Dispense Refill  . acetaminophen (TYLENOL) 500 MG tablet Take 1,000-1,500 mg by mouth 2 (two) times daily as needed for pain.      Marland Kitchen albuterol (PROVENTIL HFA;VENTOLIN HFA) 108 (90 BASE) MCG/ACT inhaler  Inhale 2 puffs into the lungs every 6 (six) hours as needed for wheezing or shortness of breath.      . levalbuterol (XOPENEX HFA) 45 MCG/ACT inhaler Inhale 1-2 puffs into the lungs every 4 (four) hours as needed for wheezing or shortness of breath.       . diphenhydrAMINE (BENADRYL) 25 MG tablet Take 1 tablet (25 mg total) by mouth every 6 (six) hours as needed for itching (as using pain medication).  12 tablet  0  . HYDROcodone-acetaminophen (NORCO/VICODIN) 5-325 MG per tablet Take 1 tablet by mouth every 6 (six) hours as needed for pain.  10 tablet  0  . ondansetron (ZOFRAN) 4 MG tablet Take 1 tablet (4 mg total) by mouth every 8 (eight) hours as needed for nausea.  12 tablet  0   No current facility-administered medications on file prior to visit.   Aspirin; Hydrocodone-acetaminophen; Ibuprofen; Imdur; Meperidine hcl; Morphine; Oxycodone-acetaminophen; Procaine hcl; and Toprol xl  History   Social History  . Marital Status: Married    Spouse Name: N/A    Number of Children: 4  . 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 Smoking status: Never Smoker   . Smokeless tobacco: Never Used  . Alcohol Use: No  .  Drug Use: No  . Sexually Active: Yes    Birth Control/ Protection: None, Surgical     Comment: LAVH   Other Topics Concern  . Not on file   Social History Narrative  . No narrative on file   History reviewed. No pertinent family history.   BP 134/80  Pulse 80  Temp(Src) 97.7 F (36.5 C)  Resp 12  Ht 5' 5.5" (1.664 m)  Wt 226 lb (102.513 kg)  BMI 37.02 kg/m2   Alert and oriented x 3.  Memory function appears to be intact.  Concentration and attention are normal for educational level and background.  Speech is fluent and without significant word finding difficulty.  Is aware of current events.  No carotid bruits detected.  Cranial nerve II through XII are within normal limits.  This  includes normal optic discs and acuity, EOMI, PERLA, facial movement and sensation intact, hearing grossly intact, gag intact,Uvula raises symmetrically and tongue protrudes evenly. Motor strength is 5 over 5 throughout all limbs.  No atrophy, abnormal tone or tremors. Reflexes are 1-2+ and symmetric in the upper and lower extremities Sensory exam is intact. Coordination is intact for fine movements and rapid alternating movements in all limbs Gait reveals antalgic gait like walking on it shows degenerative joint pains.   Impression: 1. Subacute onset of relatively symmetric joint pain involving multiple locations especially at the wrists, fingers and elbows knees and ankles.  This does involve some swelling but no obvious signs of heat.  2. 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  Plan: We will do a sed rate, rheumatoid factor and ANA today We will request a rheumatology consultation Hopefully her blood work results will be back by Friday when she sees Dr. Dayton Martes her primary care doctor.

## 2012-06-10 ENCOUNTER — Telehealth: Payer: Self-pay | Admitting: Family Medicine

## 2012-06-10 NOTE — Telephone Encounter (Signed)
Pt has a new pt apptmt scheduled for 07/15/2012, but will be out of town.  A lump has recently been discovered on her thyroid and she's also experiencing a lot of joint pain.  She wants to know if there is any way possible you could see her sooner. Can you accommodate her a sooner new pt apptmt? Thank you.

## 2012-06-14 NOTE — Telephone Encounter (Signed)
I may need to be taking some time off over next couple of weeks, so please see if she can see another provider.

## 2012-06-15 ENCOUNTER — Telehealth: Payer: Self-pay | Admitting: Family Medicine

## 2012-06-15 NOTE — Telephone Encounter (Signed)
Pt has a new pt apptmt scheduled for 07/15/2012 w/Dr. Dayton Martes, but will be out of town. A lump has recently been discovered on her thyroid and she's also experiencing a lot of joint pain. She wants to know if there is any way possible she could see her sooner. Dr. Dayton Martes cannot accommodate a sooner apptmt for her and suggest that I check w/another provider. Pt wants a female dr only. Can you accommodate her a sooner new pt apptmt, maybe during the first couple weeks of June?? Thank you.

## 2012-06-15 NOTE — Telephone Encounter (Signed)
Sent request to Dr. Ermalene Searing

## 2012-06-16 ENCOUNTER — Telehealth: Payer: Self-pay | Admitting: Neurology

## 2012-06-16 NOTE — Telephone Encounter (Signed)
Spoke with the patient. Information given as per Dr. Smiley Houseman below. The patient had not heard from the rheumatologist so I told the patient I would check on the referral for her. She will consider the gluten free diet.  I did call Dr. Fatima Sanger office and spoke with [facility] Information given via phone and referral again faxed as requested. They will call the patient to schedule.

## 2012-06-16 NOTE — Telephone Encounter (Signed)
Pt sch for 06/23/2012

## 2012-06-16 NOTE — Telephone Encounter (Signed)
Message copied by Benay Spice on Thu Jun 16, 2012  8:48 AM ------      Message from: [place], [place] L      Created: Wed Jun 15, 2012  8:30 AM       The ANA is significantly positive which means she may have an inflammatory arthritis or "connective tissue disease".  The rheumatoid factor is in the positive range, but not as high as the ANA.  This means it could be rheumatoid arthritis.  The Rheumatologist can recommend treatments or further tests if needed.  In the meantime, she might want to try gluten free diet as this can decreease inflammation.  (no wheat products)            MS      ----- Message -----         From: [facility] Interface         Sent: 06/08/2012   1:04 PM           To: Michael L. Smiley Houseman, MD                   ------

## 2012-06-16 NOTE — Telephone Encounter (Signed)
We can work her into a 30 min slot on an earlier day.

## 2012-06-16 NOTE — Telephone Encounter (Signed)
Left mssg w/pt to return call to r/s

## 2012-06-16 NOTE — Telephone Encounter (Signed)
Called and left the patient a message to return my call.

## 2012-06-23 ENCOUNTER — Ambulatory Visit (INDEPENDENT_AMBULATORY_CARE_PROVIDER_SITE_OTHER): Payer: BC Managed Care – PPO | Admitting: Family Medicine

## 2012-06-23 ENCOUNTER — Encounter: Payer: Self-pay | Admitting: Family Medicine

## 2012-06-23 VITALS — BP 130/80 | HR 94 | Temp 97.9°F | Ht 65.5 in | Wt 228.0 lb

## 2012-06-23 DIAGNOSIS — E041 Nontoxic single thyroid nodule: Secondary | ICD-10-CM | POA: Insufficient documentation

## 2012-06-23 DIAGNOSIS — E785 Hyperlipidemia, unspecified: Secondary | ICD-10-CM | POA: Insufficient documentation

## 2012-06-23 DIAGNOSIS — E7800 Pure hypercholesterolemia, unspecified: Secondary | ICD-10-CM

## 2012-06-23 DIAGNOSIS — R030 Elevated blood-pressure reading, without diagnosis of hypertension: Secondary | ICD-10-CM

## 2012-06-23 DIAGNOSIS — J4520 Mild intermittent asthma, uncomplicated: Secondary | ICD-10-CM

## 2012-06-23 DIAGNOSIS — J45909 Unspecified asthma, uncomplicated: Secondary | ICD-10-CM

## 2012-06-23 DIAGNOSIS — E079 Disorder of thyroid, unspecified: Secondary | ICD-10-CM

## 2012-06-23 DIAGNOSIS — J309 Allergic rhinitis, unspecified: Secondary | ICD-10-CM

## 2012-06-23 DIAGNOSIS — K2940 Chronic atrophic gastritis without bleeding: Secondary | ICD-10-CM

## 2012-06-23 DIAGNOSIS — I10 Essential (primary) hypertension: Secondary | ICD-10-CM | POA: Insufficient documentation

## 2012-06-23 DIAGNOSIS — M2550 Pain in unspecified joint: Secondary | ICD-10-CM

## 2012-06-23 DIAGNOSIS — K279 Peptic ulcer, site unspecified, unspecified as acute or chronic, without hemorrhage or perforation: Secondary | ICD-10-CM

## 2012-06-23 MED ORDER — TRAMADOL HCL 50 MG PO TABS: 50.0000 mg | ORAL_TABLET | Freq: Three times a day (TID) | ORAL | Status: DC | PRN

## 2012-06-23 NOTE — Assessment & Plan Note (Addendum)
Noted on MRI cervical spine incidentally. Solid not cystic.  Will eval TSH. Will to endocrinologist Dr. Elvera Lennox to set up biopsy and further eval of thyroid.

## 2012-06-23 NOTE — Assessment & Plan Note (Signed)
Will obntain records form previous MD for last cholesterol check.

## 2012-06-23 NOTE — Progress Notes (Signed)
Subjective:    Patient ID: Patricia Reed, female    DOB: [birth year], 52 y.o.   MRN: [id number]  HPI  52 year old female presents  to establish.  She sees Dr. Annamarie Dawley for GYN... Last CPX/pelvic and breast exam last spring.. planning to make appt to follow up.   She has been having 2 months of diffuse joint pain.. Ankles, hands, wrists, knees. Minimal improvement in stiffness after several hours. Swelling in joints. Using motrin for pain.  for neck pain... Had a MRI Cervical spine.  Had incidental finding of solid mass lesion right thyroid measuring 25 x 30 mm.  This was present on the prior study but direct comparison is not  possible due to different sequences. Left thyroid lobe is normal.  Thyroid ultrasound possible biopsy is suggested to rule out  neoplasm.   Feels like a finger on LEFT throat, has noted this for a year.   She has been seen by Dr. Greenacres Lions with cardiology in 01/2012 for chest pain.  She had recently had a nml cardiac catheterization. Chest pain was felt to be noncardiac At that time she was having neck pain and given hx of cervical fusion he referred her neurosurgeon  Or neurology.  She saw Dr. Smiley Houseman with neurology in 3.2014. He felt most her issues we more joint related and ordered a RF, Sed rate, ANA Her ANA was high and RF was mildly increased and she was referred to a  Rheumatologist Dr. Corliss Skains 7/10/014.   Grandmother and 2 aunts with rheumatoid arthritis     Review of Systems  Constitutional: Positive for fatigue. Negative for fever.  HENT: Negative for ear pain.   Eyes: Negative for pain.  Respiratory: Negative for chest tightness and shortness of breath.   Cardiovascular: Negative for chest pain, palpitations and leg swelling.  Gastrointestinal: Negative for abdominal pain.  Genitourinary: Negative for dysuria.       Objective:   Physical Exam  Constitutional: Vital signs are normal. She appears well-developed and well-nourished. She is  cooperative.  Non-toxic appearance. She does not appear ill. No distress.  HENT:  Head: Normocephalic.  Right Ear: Hearing, tympanic membrane, external ear and ear canal normal.  Left Ear: Hearing, tympanic membrane, external ear and ear canal normal.  Nose: Nose normal.  Eyes: Conjunctivae, EOM and lids are normal. Pupils are equal, round, and reactive to light. No foreign bodies found.  Neck: Trachea normal and normal range of motion. Neck supple. Carotid bruit is not present. Thyromegaly present. No mass present.  Right thyroid more prominent than left.  Cardiovascular: Normal rate, regular rhythm, S1 normal, S2 normal, normal heart sounds and intact distal pulses.  Exam reveals no gallop.   No murmur heard. Pulmonary/Chest: Effort normal and breath sounds normal. No respiratory distress. She has no wheezes. She has no rhonchi. She has no rales.  Abdominal: Soft. Normal appearance and bowel sounds are normal. She exhibits no distension, no fluid wave, no abdominal bruit and no mass. There is no hepatosplenomegaly. There is no tenderness. There is no rebound, no guarding and no CVA tenderness. No hernia.  Musculoskeletal:  No obvious swelling or effusion injoints but pain and stiffness with exam of hands, ankles, wrists and B knees  Lymphadenopathy:    She has no cervical adenopathy.    She has no axillary adenopathy.  Neurological: She is alert. She has normal strength. No cranial nerve deficit or sensory deficit.  Skin: Skin is warm, dry and intact. No rash noted.  Psychiatric: Her speech is normal and behavior is normal. Judgment normal. Her mood appears not anxious. Cognition and memory are normal. She does not exhibit a depressed mood.          Assessment & Plan:

## 2012-06-23 NOTE — Patient Instructions (Addendum)
Stop motrin and aleve. Start omeprazole ( prilosec) 2 tabs daily for stomach irritation. Call if not helping and we can try nexium again.  Stop at the lab on your way out. I will call you with more information of next thryoid test or referral to endo. Can start tramadol for pain until you see rheumatologist for inflammatory joint disease evaluation. Stop if causing stomach pain as well. Stop at front desk for referral to out endocrinologist Dr. Wyonia Hough. Follow up in 3 months on multiple issues.

## 2012-06-23 NOTE — Assessment & Plan Note (Signed)
ANA elevated and mild elevation of RF... 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 Has appt in 07/2012 with Rheum. Will have her use tramadol for pain as long as no stomach irritation until appt.

## 2012-06-23 NOTE — Assessment & Plan Note (Signed)
Working on lifestyle changes. Walking.

## 2012-06-23 NOTE — Assessment & Plan Note (Addendum)
Has been out of nexium. Has been on NSAID  Lately.. Now having some stomach irritation.  Out of nexium... Will try trial of omeprazole 40mg  daily.. Patricia Reed need to restart nexium.  Followed by Dr. Arlyce Dice.

## 2012-06-24 LAB — TSH: TSH: 0.83 u[IU]/mL (ref 0.35–5.50)

## 2012-07-15 ENCOUNTER — Ambulatory Visit: Payer: BC Managed Care – PPO | Admitting: Family Medicine

## 2012-08-03 ENCOUNTER — Encounter: Payer: Self-pay | Admitting: Internal Medicine

## 2012-08-03 ENCOUNTER — Ambulatory Visit (INDEPENDENT_AMBULATORY_CARE_PROVIDER_SITE_OTHER): Payer: BC Managed Care – PPO | Admitting: Internal Medicine

## 2012-08-03 VITALS — BP 134/86 | HR 72 | Temp 98.1°F | Ht 65.25 in | Wt 220.2 lb

## 2012-08-03 DIAGNOSIS — E079 Disorder of thyroid, unspecified: Secondary | ICD-10-CM

## 2012-08-03 NOTE — Patient Instructions (Addendum)
Please schedule a new appt in a year. We will have you return earlier if needed, depending on the results of the ultrasound or biopsy. Please stop to see Shirlee Limerick to arrange for your thyroid ultrasound.

## 2012-08-03 NOTE — Progress Notes (Addendum)
Patient ID: Patricia Reed, female   DOB: 1960/12/31, 52 y.o.   MRN: [id number]   HPI  Hellena EMARY ZALAR is a 52 y.o.-year-old female, referred by her PCP, Dr. Ermalene Searing, for evaluation for a thyroid mass discovered during MRI of neck for cervical pain.  The pt does not have a h/o thyroid disease. She was told she had a thyroid nodule in 2009. She had a recent MRI scan (05/10/2012) of neck for cervical radiculopathy (has a h/o cervical fusion Sx) and it was noticed that she had a right 2.5 x 3.0 cm thyroid mass vs goiter. Left lobe of the thyroid appeared normal. This was similar to the appearance of the thyroid on a previous cervical MRI performed on 04/30/2007.   Of note, pt has a h/o severe GERD (Dr. Arlyce Dice - last EGD 2012), Es stricture, s/p dilations in 2003, 2005, 2012; had a Ba esophagogram in 01/24/2009 >> normal. Also had a gastric emptying study >> normal. She also has chronic gastritis and a h/o stomach ulcers, and chronic dysphagia.   I reviewed pt's thyroid tests: Lab Results  Component Value Date   TSH 0.83 06/23/2012    Pt feels nodules in neck, no hoarseness, has dysphagia (feels like a finger pressing on neck) L odynophagia, no SOB with lying down but feels uncomfortable; she does not have hyper or hypothyroid complaints, specifically: - stable weight  - no fatigue - + constipation from acid reflux med, now d/c'd >> constipation resolved - dry skin - no hair falling - no problems with concentration - no depression/anxiety - has intolerance to cold - no palpitations - no tremors  She has a + FH of thyroid disorders in: aunt, 2 cousins (hyper and hypothyroidism) No FH of thyroid cancer. No h/o radiation tx to head or neck.  I reviewed her chart and she also has a history of mild intermittent asthma, CP in 01/2012 - seen by Dr. Antoine Poche >> normal cardiac cath; arthralgias and joint swelling in every joint - recent positive ANA, RF and high ESR at 27 - will see  rheumatologist tomorrow - started Tramadol but developed itching - started Bendaryl; sickle cell trait.   ROS: Constitutional: + weight loss, decreased appetite no fatigue, + hot flushes (had hysterectomy 1996; menopause at 52 y/o); + poor sleep Eyes: no blurry vision, no xerophthalmia ENT: no sore throat, + nodules palpated in throat, + both dysphagia/odynophagia, no hoarseness Cardiovascular: no CP/SOB/palpitations/+ periankle leg swelling Respiratory: no cough/SOB Gastrointestinal: no N/V/D/+ C - see HPI Musculoskeletal: no muscle/+ joint aches and swelling Skin: no rashes Neurological: no tremors/numbness/tingling/dizziness Psychiatric: no depression/anxiety  Past Medical History  Diagnosis Date  . Achalasia   . Vertigo   . History of stomach ulcers   . GERD (gastroesophageal reflux disease)   . HTN (hypertension)   . Anemia   . Asthma    Past Surgical History  Procedure Laterality Date  . Cesarean section    . Cervical discectomy      plates and screws in neck  . Exc benign breast lump    . Esophagogastroduodenoscopy  03/04/2011    Procedure: ESOPHAGOGASTRODUODENOSCOPY (EGD);  Surgeon: Louis Meckel, MD;  Location: [facility];  Service: Endoscopy;  Laterality: N/A;  BOTOX Injection  . Tonsillectomy    . Abdominal hysterectomy      done for menorrhagia, ovaries remain   History   Social History  . 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 Smoking status: Never Smoker   . Smokeless tobacco: Never Used  . Alcohol Use: No  . Drug Use: No  . Sexually Active: Yes    Birth Control/ Protection: None, Surgical     Comment: LAVH   Current Outpatient Prescriptions on File Prior to Visit  Medication Sig Dispense Refill  . acetaminophen (TYLENOL) 500 MG tablet Take 1,000-1,500 mg by mouth 2 (two) times daily as needed for  pain.      Marland Kitchen albuterol (PROVENTIL HFA;VENTOLIN HFA) 108 (90 BASE) MCG/ACT inhaler Inhale 2 puffs into the lungs every 6 (six) hours as needed for wheezing or shortness of breath.      Marland Kitchen ibuprofen (ADVIL,MOTRIN) 200 MG tablet Take 400 mg by mouth every 6 (six) hours as needed for pain.      Marland Kitchen levalbuterol (XOPENEX HFA) 45 MCG/ACT inhaler Inhale 1-2 puffs into the lungs every 4 (four) hours as needed for wheezing or shortness of breath.       . traMADol (ULTRAM) 50 MG tablet Take 1 tablet (50 mg total) by mouth every 8 (eight) hours as needed for pain.  30 tablet  0   No current facility-administered medications on file prior to visit.   Allergies  Allergen Reactions  . Aspirin Other (See Comments)    GI bleed  . Hydrocodone-Acetaminophen Hives and Nausea And Vomiting  . Ibuprofen Other (See Comments)    GI bleed  . Imdur (Isosorbide) Other (See Comments)    Reaction unknown  . Meperidine Hcl Hives and Nausea And Vomiting    Short term memory loss  . Morphine Hives and Nausea And Vomiting  . Oxycodone-Acetaminophen Hives and Nausea And Vomiting    Reaction unknown  . Procaine Hcl Other (See Comments)    Ineffective  . Toprol Xl (Metoprolol) Other (See Comments)    Reaction unknown  . Tramadol Itching   Family History  Problem Relation Age of Onset  . Alcohol abuse Father   . Hypertension Father   . Heart disease Father    PE: BP 134/86  Pulse 72  Temp(Src) 98.1 F (36.7 C) (Oral)  Ht 5' 5.25" (1.657 m)  Wt 220 lb 4 oz (99.905 kg)  BMI 36.39 kg/m2 Wt Readings from Last 3 Encounters:  08/03/12 220 lb 4 oz (99.905 kg)  06/23/12 228 lb (103.42 kg)  06/08/12 226 lb (102.513 kg)   Constitutional: overweight, in NAD Eyes: PERRLA, EOMI, no exophthalmos ENT: moist mucous membranes, + thyromegaly R>L - no individual nodules palpated; R thyroid moves freely with swallowing, no cervical lymphadenopathy Cardiovascular: RRR, No MRG Respiratory: CTA B Gastrointestinal: abdomen  soft, NT, ND, BS+ Musculoskeletal: no deformities, strength intact in all 4 Skin: moist, warm, no rashes Neurological: no tremor with outstretched hands, DTR normal in all 4  ASSESSMENT: 1. Thyroid mass - seen on cervical spine MRI in 2009 and 2014 - dysphagia - h/o Es strictures (s/p several dilations) and severe GERD - Dr. Arlyce Dice  PLAN:  1. I discussed with the pt about her recent cervical MRI finding of a right thyroid mass which was also previously seen on an MRI from 2009. I explained that the MRI test is not optimum to actually characterize the mass and this can be an enlarged R thyroid lobe or a thyroid nodule. We will need to obtain a neck ultrasound to better describe the thyroid  architecture.  Depending on the results of the U/S, we might need to proceed as follows:  if R thyroid lobe homogeneously enlarged, then there is no indication for biopsy or lobectomy unless her swallowing becomes severely affected. In that case, I would first repeat the barium esophagogram to make sure that the dysphagia is caused by external compression and not by an esophageal stricture  if there is a R thyroid nodule, we might need to do an FNA, depending on the size and the imaging phenotype of the nodule, and then proceed depending on the FNA result >> thyroidectomy for [facility] or expectant management if benign - pt agrees to plan - will await the result of the Thyroid U/S for now. - I will schedule her to come back in 1 year, but might need to move this sooner if needed  *RADIOLOGY REPORT*  Clinical Data: Thyroid mass noted on MRI of the cervical spine  THYROID ULTRASOUND  Technique: Ultrasound examination of the thyroid gland and adjacent soft tissues was performed.  Comparison: MR c-spine of 05/10/2012  Findings:  Right thyroid lobe: 6.0 x 2.5 x 3.1 cm. Left thyroid lobe: 5.4 x 1.6 x 1.7 cm. Isthmus: 3.6 mm in thickness.  Focal nodules: The echogenicity of the thyroid parenchyma  is homogeneous. A large solid nodule occupies much of the right lobe of thyroid measuring 4.7 x 2.3 x 2.7 cm, with some calcifications internally. Findings meet consensus criteria for biopsy. Ultrasound-guided fine needle aspiration should be considered, as per the consensus statement: Management of Thyroid Nodules Detected at [place]: Society of Radiologists in Ultrasound Consensus Conference Statement. Radiology 2005; [id number].  On the left, only a single 5 mm solid nodule is noted in the mid left lobe.  Lymphadenopathy: None visualized.  IMPRESSION: Much of the right lobe of thyroid is occupied by a large solid nodule of 4.7 mm with some calcifications. Recommend biopsy as noted above.   Original Report Authenticated By: Dwyane Dee, M.D.  Called pt >> agrees with FNA >> Will order.  FNA:  Adequacy Reason Satisfactory For Evaluation. Diagnosis THYROID, FINE NEEDLE ASPIRATION, RIGHT FINDINGS CONSISTENT WITH NON-NEOPLASTIC GOITER SEE COMMENT. COMMENT: THE CASE WAS REVIEWED WITH DR Raynald Blend, WHO CONCURS. [place] RUND DO Pathologist, Electronic Signature (Case signed 08/10/2012)  Will call pt. RTC in 1 year.  Pt was called about the results and she mentioned that she still has dysphagia and would like to go ahead with Ba swallow to see if a thyroidectomy is needed.  *RADIOLOGY REPORT*  Clinical Data:Dysphagia.  ESOPHAGUS/BARIUM SWALLOW/TABLET STUDY  Fluoroscopy Time: The 1 minute, 42 seconds. Comparison: 02/13/2009  Findings: Fluoroscopic evaluation of swallowing demonstrates normal esophageal peristalsis. Imaging of the cervical esophagus in the AP and lateral projections demonstrate no visible compression in the region of the thyroid. The patient is status post anterior cervical fusion with anterior plate at G9-5. There is anterior degenerative spurring at the C4-5 level with mild impression on the posterior wall of the cervical esophagus due to these spurs. Similar  degenerative spurring noted also at C6-7 with impression on the posterior wall of the esophagus.  Otherwise, no fixed stricture, fold thickening or mass. The patient swallowed a 13 mm barium tablet which freely passed into the stomach. No reflux with water siphon maneuver.  IMPRESSION: Impression on the posterior wall of the esophagus due to spurs at C4-5 and C6-7 (above and below the prior anterior fusion level).   Original Report Authenticated By: Charlett Nose, M.D.  Dysphagia not due to thyroid  compression but cervical spurs. Called and let pt know. Will also FWD to Dr. Ermalene Searing.

## 2012-08-04 ENCOUNTER — Ambulatory Visit
Admission: RE | Admit: 2012-08-04 | Discharge: 2012-08-04 | Disposition: A | Discharge status: Home / Self Care | Payer: BC Managed Care – PPO | Source: Ambulatory Visit | Attending: Internal Medicine | Admitting: Internal Medicine

## 2012-08-05 ENCOUNTER — Telehealth: Payer: Self-pay | Admitting: *Deleted

## 2012-08-05 NOTE — Telephone Encounter (Signed)
Called Patricia Reed and advised her that Dr Elvera Lennox spoke with pt yesterday. It is ok to go ahead and schedule FNA. [name] understood.

## 2012-08-05 NOTE — Telephone Encounter (Signed)
Patricia Reed,  I talked to her yesterday - please see the end of my visit note. Let's schedule the FNA. C

## 2012-08-05 NOTE — Telephone Encounter (Signed)
Patricia Reed, [facility] at the [facility] office called stating that she saw that pt had a thryoid U/S on Thurs and that there were orders in for a thyroid biopsy. She did not see a note in the chart where we discussed with the pt about having the biopsy and she did not want to schedule this and call the pt and catch her off guard. Please advise.

## 2012-08-09 ENCOUNTER — Ambulatory Visit
Admission: RE | Admit: 2012-08-09 | Discharge: 2012-08-09 | Disposition: A | Discharge status: Home / Self Care | Payer: BC Managed Care – PPO | Source: Ambulatory Visit | Attending: Internal Medicine | Admitting: Internal Medicine

## 2012-08-09 ENCOUNTER — Other Ambulatory Visit (HOSPITAL_COMMUNITY)
Admission: RE | Admit: 2012-08-09 | Discharge: 2012-08-09 | Disposition: A | Discharge status: Home / Self Care | Payer: BC Managed Care – PPO | Source: Ambulatory Visit | Attending: Interventional Radiology | Admitting: Interventional Radiology

## 2012-08-09 DIAGNOSIS — E049 Nontoxic goiter, unspecified: Secondary | ICD-10-CM | POA: Insufficient documentation

## 2012-08-11 ENCOUNTER — Other Ambulatory Visit: Payer: Self-pay | Admitting: Internal Medicine

## 2012-08-11 DIAGNOSIS — E041 Nontoxic single thyroid nodule: Secondary | ICD-10-CM

## 2012-08-18 ENCOUNTER — Encounter: Payer: Self-pay | Admitting: Family Medicine

## 2012-08-18 ENCOUNTER — Ambulatory Visit (INDEPENDENT_AMBULATORY_CARE_PROVIDER_SITE_OTHER): Payer: BC Managed Care – PPO | Admitting: Family Medicine

## 2012-08-18 VITALS — BP 130/80 | HR 75 | Temp 98.4°F | Wt 223.0 lb

## 2012-08-18 DIAGNOSIS — M1990 Unspecified osteoarthritis, unspecified site: Secondary | ICD-10-CM | POA: Insufficient documentation

## 2012-08-18 DIAGNOSIS — M069 Rheumatoid arthritis, unspecified: Secondary | ICD-10-CM

## 2012-08-18 DIAGNOSIS — R030 Elevated blood-pressure reading, without diagnosis of hypertension: Secondary | ICD-10-CM

## 2012-08-18 MED ORDER — HYDROCHLOROTHIAZIDE 25 MG PO TABS: 25.0000 mg | ORAL_TABLET | Freq: Every day | ORAL | Status: DC

## 2012-08-18 NOTE — Assessment & Plan Note (Addendum)
Likely worsened lately due to new start prednisone. This med has helped her a lot ... She may be changing to a different DMARD at upcoming Rheum follow up. Until then we will start  HCTZ to treat elevated BP. Follow Bps at home.  Encouraged exercise, weight loss, healthy eating habits.  Close follow up for BP check.

## 2012-08-18 NOTE — Patient Instructions (Addendum)
Start HCTZ daily in AM. Follow BP at home Goal <140/90. Work on healthy lifestyle, regular exercise... Work on weight loss. Follow up in 2 weeks for BP check with Dr. Leonard Schwartz.

## 2012-08-18 NOTE — Progress Notes (Signed)
  Subjective:    Patient ID: Patricia Reed, female    DOB: 10/13/1960, 52 y.o.   MRN: [id number]  HPI  52 year old female presents for BP recheck. She reports BP has been elevated since being on prednisone.  At [facility] BP 140/110 157/111   Saw Dr. Wyonia Hough ENDO for  Thyroid issue... US showed benign findings. She is scheduled for swallowing study  Because it is interfering with    She has seen rheumatologist, Dr. Corliss Skains: Felt that it was RA. Started on prednisone... Which has helped a lot with joint pain. She has a follow up appt 8/16.   Review of Systems  Constitutional: Negative for fever and fatigue.  HENT: Negative for ear pain.   Eyes: Negative for pain.  Respiratory: Positive for shortness of breath. Negative for cough, chest tightness and wheezing.   Cardiovascular: Positive for palpitations. Negative for chest pain and leg swelling.  Gastrointestinal: Negative for abdominal pain.  Genitourinary: Negative for dysuria.       Objective:   Physical Exam  Constitutional: She is oriented to person, place, and time. Vital signs are normal. She appears well-developed and well-nourished. She is cooperative.  Non-toxic appearance. She does not appear ill. No distress.  HENT:  Head: Normocephalic.  Right Ear: Hearing, tympanic membrane, external ear and ear canal normal. Tympanic membrane is not erythematous, not retracted and not bulging.  Left Ear: Hearing, tympanic membrane, external ear and ear canal normal. Tympanic membrane is not erythematous, not retracted and not bulging.  Nose: No mucosal edema or rhinorrhea. Right sinus exhibits no maxillary sinus tenderness and no frontal sinus tenderness. Left sinus exhibits no maxillary sinus tenderness and no frontal sinus tenderness.  Mouth/Throat: Uvula is midline, oropharynx is clear and moist and mucous membranes are normal.  Eyes: Conjunctivae, EOM and lids are normal. Pupils are equal, round, and reactive to light. No  foreign bodies found.  Neck: Trachea normal and normal range of motion. Neck supple. Carotid bruit is not present. No mass and no thyromegaly present.  Cardiovascular: Normal rate, regular rhythm, S1 normal, S2 normal, normal heart sounds, intact distal pulses and normal pulses.  Exam reveals no gallop and no friction rub.   No murmur heard. Pulmonary/Chest: Effort normal and breath sounds normal. Not tachypneic. No respiratory distress. She has no decreased breath sounds. She has no wheezes. She has no rhonchi. She has no rales.  Abdominal: Soft. Normal appearance and bowel sounds are normal. There is no tenderness.  Neurological: She is alert and oriented to person, place, and time.  Skin: Skin is warm, dry and intact. No rash noted.  Psychiatric: Her speech is normal and behavior is normal. Judgment and thought content normal. Her mood appears not anxious. Cognition and memory are normal. She does not exhibit a depressed mood.          Assessment & Plan:

## 2012-08-22 ENCOUNTER — Telehealth: Payer: Self-pay | Admitting: *Deleted

## 2012-08-22 NOTE — Telephone Encounter (Signed)
Pt called asking about her appt for her throat/esophagus. Can you please contact pt when scheduled? Thank you.

## 2012-08-26 ENCOUNTER — Ambulatory Visit
Admission: RE | Admit: 2012-08-26 | Discharge: 2012-08-26 | Disposition: A | Discharge status: Home / Self Care | Payer: BC Managed Care – PPO | Source: Ambulatory Visit | Attending: Internal Medicine | Admitting: Internal Medicine

## 2012-11-25 ENCOUNTER — Emergency Department (HOSPITAL_COMMUNITY): Payer: BC Managed Care – PPO

## 2012-11-25 ENCOUNTER — Emergency Department (HOSPITAL_COMMUNITY)
Admission: EM | Admit: 2012-11-25 | Discharge: 2012-11-25 | Disposition: A | Discharge status: Home / Self Care | Payer: BC Managed Care – PPO | Attending: Emergency Medicine | Admitting: Emergency Medicine

## 2012-11-25 ENCOUNTER — Encounter (HOSPITAL_COMMUNITY): Payer: Self-pay | Admitting: Emergency Medicine

## 2012-11-25 DIAGNOSIS — R072 Precordial pain: Secondary | ICD-10-CM | POA: Insufficient documentation

## 2012-11-25 DIAGNOSIS — Z8639 Personal history of other endocrine, nutritional and metabolic disease: Secondary | ICD-10-CM | POA: Insufficient documentation

## 2012-11-25 DIAGNOSIS — R079 Chest pain, unspecified: Secondary | ICD-10-CM

## 2012-11-25 DIAGNOSIS — I10 Essential (primary) hypertension: Secondary | ICD-10-CM | POA: Insufficient documentation

## 2012-11-25 DIAGNOSIS — Z862 Personal history of diseases of the blood and blood-forming organs and certain disorders involving the immune mechanism: Secondary | ICD-10-CM | POA: Insufficient documentation

## 2012-11-25 DIAGNOSIS — J45901 Unspecified asthma with (acute) exacerbation: Secondary | ICD-10-CM | POA: Insufficient documentation

## 2012-11-25 DIAGNOSIS — Z79899 Other long term (current) drug therapy: Secondary | ICD-10-CM | POA: Insufficient documentation

## 2012-11-25 DIAGNOSIS — K219 Gastro-esophageal reflux disease without esophagitis: Secondary | ICD-10-CM | POA: Insufficient documentation

## 2012-11-25 HISTORY — DX: Disorder of thyroid, unspecified: E07.9

## 2012-11-25 LAB — CBC
Platelets: 276 10*3/uL (ref 150–400)
RBC: 4.21 MIL/uL (ref 3.87–5.11)
WBC: 4.1 10*3/uL (ref 4.0–10.5)

## 2012-11-25 LAB — BASIC METABOLIC PANEL
CO2: 28 mEq/L (ref 19–32)
Chloride: 104 mEq/L (ref 96–112)
Potassium: 3.5 mEq/L (ref 3.5–5.1)
Sodium: 140 mEq/L (ref 135–145)

## 2012-11-25 LAB — PRO B NATRIURETIC PEPTIDE: Pro B Natriuretic peptide (BNP): 28.2 pg/mL (ref 0–125)

## 2012-11-25 LAB — POCT I-STAT TROPONIN I: Troponin i, poc: 0 ng/mL (ref 0.00–0.08)

## 2012-11-25 NOTE — ED Provider Notes (Signed)
CSN: [id number]     Arrival date & time 11/25/12  7829 History   First MD Initiated Contact with Patient 11/25/12 [phone]     Chief Complaint  Patient presents with  . Chest Pain   (Consider location/radiation/quality/duration/timing/severity/associated sxs/prior Treatment) Patient is a 52 y.o. female presenting with chest pain. The history is provided by the patient.  Chest Pain Pain location:  Substernal area and L chest Associated symptoms: shortness of breath   Associated symptoms: no abdominal pain, no back pain, no headache, no nausea, no numbness, not vomiting and no weakness    Patient with left-sided chest pain and mild dyspnea with the episodes. States she has had episodes recently that lasted around 5 minutes. She states with these episodes she has some difficulty breathing. No fevers. No cough. No abdominal pain. No nausea. Pain is worse with palpation. She states she was told she may have had a heart attack previously. She states it found on EKG. She states she is here because the pain has been lasting longer. She's been having episodes over the last 2 weeks. No swelling in her legs. Past Medical History  Diagnosis Date  . Achalasia   . Vertigo   . History of stomach ulcers   . GERD (gastroesophageal reflux disease)   . HTN (hypertension)   . Anemia   . Asthma   . Thyroid mass    Past Surgical History  Procedure Laterality Date  . Cesarean section    . Cervical discectomy      plates and screws in neck  . Exc benign breast lump    . Esophagogastroduodenoscopy  03/04/2011    Procedure: ESOPHAGOGASTRODUODENOSCOPY (EGD);  Surgeon: Louis Meckel, MD;  Location: [facility];  Service: Endoscopy;  Laterality: N/A;  BOTOX Injection  . Tonsillectomy    . Abdominal hysterectomy      done for menorrhagia, ovaries remain   Family History  Problem Relation Age of Onset  . Alcohol abuse Father   . Hypertension Father   . Heart disease Father    History  Substance Use  Topics  . Smoking status: Never Smoker   . Smokeless tobacco: Never Used  . Alcohol Use: No   OB History   Grav Para Term Preterm Abortions TAB SAB Ect Mult Living   5 3 3             Review of Systems  Constitutional: Negative for activity change and appetite change.  Eyes: Negative for pain.  Respiratory: Positive for shortness of breath. Negative for chest tightness.   Cardiovascular: Positive for chest pain. Negative for leg swelling.  Gastrointestinal: Negative for nausea, vomiting, abdominal pain and diarrhea.  Genitourinary: Negative for flank pain.  Musculoskeletal: Negative for back pain and neck stiffness.  Skin: Negative for rash.  Neurological: Negative for weakness, numbness and headaches.  Psychiatric/Behavioral: Negative for behavioral problems.    Allergies  Aspirin; Hydrocodone-acetaminophen; Ibuprofen; Imdur; Meperidine hcl; Morphine; Oxycodone-acetaminophen; Procaine hcl; Toprol xl; and Tramadol  Home Medications   Current Outpatient Rx  Name  Route  Sig  Dispense  Refill  . albuterol (PROVENTIL HFA;VENTOLIN HFA) 108 (90 BASE) MCG/ACT inhaler   Inhalation   Inhale 2 puffs into the lungs every 6 (six) hours as needed for wheezing or shortness of breath.         . hydroxychloroquine (PLAQUENIL) 200 MG tablet   Oral   Take 200 mg by mouth 2 (two) times daily.         Marland Kitchen  levalbuterol (XOPENEX HFA) 45 MCG/ACT inhaler   Inhalation   Inhale 1-2 puffs into the lungs every 4 (four) hours as needed for wheezing or shortness of breath.          Marland Kitchen omeprazole (PRILOSEC) 20 MG capsule   Oral   Take 20 mg by mouth daily.         . predniSONE (DELTASONE) 5 MG tablet   Oral   Take 5 mg by mouth daily with breakfast.           BP 152/101  Pulse 69  Temp(Src) 97.6 F (36.4 C) (Oral)  Resp 16  SpO2 100% Physical Exam  Nursing note and vitals reviewed. Constitutional: She is oriented to person, place, and time. She appears well-developed and  well-nourished.  HENT:  Head: Normocephalic and atraumatic.  Eyes: EOM are normal. Pupils are equal, round, and reactive to light.  Neck: Normal range of motion. Neck supple.  Cardiovascular: Normal rate, regular rhythm and normal heart sounds.   No murmur heard. Pulmonary/Chest: Effort normal and breath sounds normal. No respiratory distress. She has no wheezes. She has no rales. She exhibits tenderness.  Tenderness to left parasternal area. No rash.  Abdominal: Soft. Bowel sounds are normal. She exhibits no distension. There is no tenderness. There is no rebound and no guarding.  Musculoskeletal: Normal range of motion.  Neurological: She is alert and oriented to person, place, and time. No cranial nerve deficit.  Skin: Skin is warm and dry.  Psychiatric: She has a normal mood and affect. Her speech is normal.    ED Course  Procedures (including critical care time) Labs Review Labs Reviewed  CBC - Abnormal; Notable for the following:    Hemoglobin 11.9 (*)    All other components within normal limits  BASIC METABOLIC PANEL  PRO B NATRIURETIC PEPTIDE  POCT I-STAT TROPONIN I   Imaging Review Dg Chest 2 View  11/25/2012   CLINICAL DATA:  Shortness of breath, chest pain.  EXAM: CHEST  2 VIEW  COMPARISON:  Prior radiograph from 06/08/2010  FINDINGS: The cardiac and mediastinal silhouettes are stable in size and contour, and remain within normal limits.  Lungs are clear without focal infiltrate, pleural effusion, pulmonary edema, or pneumothorax.  Osseous structures are unchanged. ACDF overlies the lower cervical spine.  IMPRESSION: No active cardiopulmonary disease.   Electronically Signed   By: Rise Mu M.D.   On: 11/25/2012 05:56    EKG Interpretation     Ventricular Rate:  71 PR Interval:  176 QRS Duration: 90 QT Interval:  414 QTC Calculation: 449 R Axis:   23 Text Interpretation:  Normal sinus rhythm Cannot rule out Anterior infarct , age undetermined unchanged  from previous            MDM   1. Chest pain    Patient with chest pain. EKG and labwork reassuring. Has had a negative heart catheterization. Doubt pulmonary embolism. No hypoxia. The episodes come and go. Patient will be discharged home    Juliet Rude. Rubin Payor, MD 11/25/12 [phone]

## 2012-11-25 NOTE — ED Notes (Signed)
Pt. reports left chest pain for 3 days with SOB , denies nausea /vomitting or diaphoresis .

## 2013-02-08 ENCOUNTER — Encounter (HOSPITAL_COMMUNITY): Payer: Self-pay | Admitting: Emergency Medicine

## 2013-02-08 ENCOUNTER — Emergency Department (HOSPITAL_COMMUNITY)
Admission: EM | Admit: 2013-02-08 | Discharge: 2013-02-08 | Disposition: A | Discharge status: Home / Self Care | Payer: BC Managed Care – PPO | Attending: Emergency Medicine | Admitting: Emergency Medicine

## 2013-02-08 ENCOUNTER — Emergency Department (HOSPITAL_COMMUNITY): Payer: BC Managed Care – PPO

## 2013-02-08 DIAGNOSIS — E079 Disorder of thyroid, unspecified: Secondary | ICD-10-CM | POA: Insufficient documentation

## 2013-02-08 DIAGNOSIS — R059 Cough, unspecified: Secondary | ICD-10-CM | POA: Insufficient documentation

## 2013-02-08 DIAGNOSIS — Z8719 Personal history of other diseases of the digestive system: Secondary | ICD-10-CM | POA: Insufficient documentation

## 2013-02-08 DIAGNOSIS — R509 Fever, unspecified: Secondary | ICD-10-CM | POA: Insufficient documentation

## 2013-02-08 DIAGNOSIS — K220 Achalasia of cardia: Secondary | ICD-10-CM | POA: Insufficient documentation

## 2013-02-08 DIAGNOSIS — J029 Acute pharyngitis, unspecified: Secondary | ICD-10-CM | POA: Insufficient documentation

## 2013-02-08 DIAGNOSIS — J45909 Unspecified asthma, uncomplicated: Secondary | ICD-10-CM | POA: Insufficient documentation

## 2013-02-08 DIAGNOSIS — J3489 Other specified disorders of nose and nasal sinuses: Secondary | ICD-10-CM | POA: Insufficient documentation

## 2013-02-08 DIAGNOSIS — D649 Anemia, unspecified: Secondary | ICD-10-CM | POA: Insufficient documentation

## 2013-02-08 DIAGNOSIS — R42 Dizziness and giddiness: Secondary | ICD-10-CM | POA: Insufficient documentation

## 2013-02-08 DIAGNOSIS — IMO0001 Reserved for inherently not codable concepts without codable children: Secondary | ICD-10-CM | POA: Insufficient documentation

## 2013-02-08 DIAGNOSIS — I10 Essential (primary) hypertension: Secondary | ICD-10-CM | POA: Insufficient documentation

## 2013-02-08 DIAGNOSIS — K219 Gastro-esophageal reflux disease without esophagitis: Secondary | ICD-10-CM | POA: Insufficient documentation

## 2013-02-08 DIAGNOSIS — R05 Cough: Secondary | ICD-10-CM | POA: Insufficient documentation

## 2013-02-08 DIAGNOSIS — IMO0002 Reserved for concepts with insufficient information to code with codable children: Secondary | ICD-10-CM | POA: Insufficient documentation

## 2013-02-08 DIAGNOSIS — R630 Anorexia: Secondary | ICD-10-CM | POA: Insufficient documentation

## 2013-02-08 LAB — RAPID STREP SCREEN (MED CTR MEBANE ONLY): Streptococcus, Group A Screen (Direct): NEGATIVE

## 2013-02-08 MED ORDER — PROMETHAZINE-DM 6.25-15 MG/5ML PO SYRP: 2.5000 mL | ORAL_SOLUTION | Freq: Four times a day (QID) | ORAL | Status: DC | PRN

## 2013-02-08 MED ORDER — DEXAMETHASONE SODIUM PHOSPHATE 10 MG/ML IJ SOLN
10.0000 mg | Freq: Once | INTRAMUSCULAR | Status: AC
  Administered 2013-02-08: 10 mg via INTRAMUSCULAR
  Filled 2013-02-08: qty 1

## 2013-02-08 NOTE — Discharge Instructions (Signed)
Pharyngitis °Pharyngitis is redness, pain, and swelling (inflammation) of your pharynx.  °CAUSES  °Pharyngitis is usually caused by infection. Most of the time, these infections are from viruses (viral) and are part of a cold. However, sometimes pharyngitis is caused by bacteria (bacterial). Pharyngitis can also be caused by allergies. Viral pharyngitis may be spread from person to person by coughing, sneezing, and personal items or utensils (cups, forks, spoons, toothbrushes). Bacterial pharyngitis may be spread from person to person by more intimate contact, such as kissing.  °SIGNS AND SYMPTOMS  °Symptoms of pharyngitis include:   °· Sore throat.   °· Tiredness (fatigue).   °· Low-grade fever.   °· Headache. °· Joint pain and muscle aches. °· Skin rashes. °· Swollen lymph nodes. °· Plaque-like film on throat or tonsils (often seen with bacterial pharyngitis). °DIAGNOSIS  °Your health care provider will ask you questions about your illness and your symptoms. Your medical history, along with a physical exam, is often all that is needed to diagnose pharyngitis. Sometimes, a rapid strep test is done. Other lab tests may also be done, depending on the suspected cause.  °TREATMENT  °Viral pharyngitis will usually get better in 3 4 days without the use of medicine. Bacterial pharyngitis is treated with medicines that kill germs (antibiotics).  °HOME CARE INSTRUCTIONS  °· Drink enough water and fluids to keep your urine clear or pale yellow.   °· Only take over-the-counter or prescription medicines as directed by your health care provider:   °· If you are prescribed antibiotics, make sure you finish them even if you start to feel better.   °· Do not take aspirin.   °· Get lots of rest.   °· Gargle with 8 oz of salt water (½ tsp of salt per 1 qt of water) as often as every 1 2 hours to soothe your throat.   °· Throat lozenges (if you are not at risk for choking) or sprays may be used to soothe your throat. °SEEK MEDICAL  CARE IF:  °· You have large, tender lumps in your neck. °· You have a rash. °· You cough up green, yellow-brown, or bloody spit. °SEEK IMMEDIATE MEDICAL CARE IF:  °· Your neck becomes stiff. °· You drool or are unable to swallow liquids. °· You vomit or are unable to keep medicines or liquids down. °· You have severe pain that does not go away with the use of recommended medicines. °· You have trouble breathing (not caused by a stuffy nose). °MAKE SURE YOU:  °· Understand these instructions. °· Will watch your condition. °· Will get help right away if you are not doing well or get worse. °Document Released: 01/05/2005 Document Revised: 10/26/2012 Document Reviewed: 09/12/2012 °[employer]® Patient Information ©2014 [employer]. ° °Sore Throat °A sore throat is pain, burning, irritation, or scratchiness of the throat. There is often pain or tenderness when swallowing or talking. A sore throat may be accompanied by other symptoms, such as coughing, sneezing, fever, and swollen neck glands. A sore throat is often the first sign of another sickness, such as a cold, flu, strep throat, or mononucleosis (commonly known as mono). Most sore throats go away without medical treatment. °CAUSES  °The most common causes of a sore throat include: °· A viral infection, such as a cold, flu, or mono. °· A bacterial infection, such as strep throat, tonsillitis, or whooping cough. °· Seasonal allergies. °· Dryness in the air. °· Irritants, such as smoke or pollution. °· Gastroesophageal reflux disease (GERD). °HOME CARE INSTRUCTIONS  °· Only take over-the-counter   medicines as directed by your caregiver. °· Drink enough fluids to keep your urine clear or pale yellow. °· Rest as needed. °· Try using throat sprays, lozenges, or sucking on hard candy to ease any pain (if older than 4 years or as directed). °· Sip warm liquids, such as broth, herbal tea, or warm water with honey to relieve pain temporarily. You may also eat or drink cold or  frozen liquids such as frozen ice pops. °· Gargle with salt water (mix 1 tsp salt with 8 oz of water). °· Do not smoke and avoid secondhand smoke. °· Put a cool-mist humidifier in your bedroom at night to moisten the air. You can also turn on a hot shower and sit in the bathroom with the door closed for 5 10 minutes. °SEEK IMMEDIATE MEDICAL CARE IF: °· You have difficulty breathing. °· You are unable to swallow fluids, soft foods, or your saliva. °· You have increased swelling in the throat. °· Your sore throat does not get better in 7 days. °· You have nausea and vomiting. °· You have a fever or persistent symptoms for more than 2 3 days. °· You have a fever and your symptoms suddenly get worse. °MAKE SURE YOU:  °· Understand these instructions. °· Will watch your condition. °· Will get help right away if you are not doing well or get worse. °Document Released: 02/13/2004 Document Revised: 12/23/2011 Document Reviewed: 09/13/2011 °[employer]® Patient Information ©2014 [employer]. ° °

## 2013-02-08 NOTE — ED Provider Notes (Signed)
CSN: [id number]     Arrival date & time 02/08/13  0435 History   First MD Initiated Contact with Patient 02/08/13 [phone]     Chief Complaint  Patient presents with  . Sore Throat   (Consider location/radiation/quality/duration/timing/severity/associated sxs/prior Treatment) HPI Comments: Patient here with sore throat, cough and nasal congestion for the past week - she reports low grade fever with TMax of 100.5 (orally), states no headache, body aches, reports productive cough with green sputum production.  Is able to swallow secretions, but pain with swallowing.  Denies chills, chest pain, shortness of breath, LE edema.  Patient is a 53 y.o. female presenting with pharyngitis. The history is provided by the patient. No language interpreter was used.  Sore Throat This is a new problem. The current episode started in the past 7 days. The problem occurs constantly. The problem has been gradually worsening. Associated symptoms include anorexia, congestion, coughing, myalgias and a sore throat. Pertinent negatives include no abdominal pain, chest pain, chills, fatigue, fever, headaches, nausea, neck pain, numbness, rash, swollen glands, urinary symptoms, visual change, vomiting or weakness. The symptoms are aggravated by swallowing. She has tried nothing for the symptoms. The treatment provided no relief.    Past Medical History  Diagnosis Date  . Achalasia   . Vertigo   . History of stomach ulcers   . GERD (gastroesophageal reflux disease)   . HTN (hypertension)   . Anemia   . Asthma   . Thyroid mass    Past Surgical History  Procedure Laterality Date  . Cesarean section    . Cervical discectomy      plates and screws in neck  . Exc benign breast lump    . Esophagogastroduodenoscopy  03/04/2011    Procedure: ESOPHAGOGASTRODUODENOSCOPY (EGD);  Surgeon: Louis Meckel, MD;  Location: [facility];  Service: Endoscopy;  Laterality: N/A;  BOTOX Injection  . Tonsillectomy    . Abdominal  hysterectomy      done for menorrhagia, ovaries remain   Family History  Problem Relation Age of Onset  . Alcohol abuse Father   . Hypertension Father   . Heart disease Father    History  Substance Use Topics  . Smoking status: Never Smoker   . Smokeless tobacco: Never Used  . Alcohol Use: No   OB History   Grav Para Term Preterm Abortions TAB SAB Ect Mult Living   5 3 3             Review of Systems  Constitutional: Negative for fever, chills and fatigue.  HENT: Positive for congestion and sore throat.   Respiratory: Positive for cough.   Cardiovascular: Negative for chest pain.  Gastrointestinal: Positive for anorexia. Negative for nausea, vomiting and abdominal pain.  Musculoskeletal: Positive for myalgias. Negative for neck pain.  Skin: Negative for rash.  Neurological: Negative for weakness, numbness and headaches.  All other systems reviewed and are negative.    Allergies  Aspirin; Hydrocodone-acetaminophen; Ibuprofen; Imdur; Meperidine hcl; Morphine; Oxycodone-acetaminophen; Procaine hcl; Toprol xl; and Tramadol  Home Medications   Current Outpatient Rx  Name  Route  Sig  Dispense  Refill  . albuterol (PROVENTIL HFA;VENTOLIN HFA) 108 (90 BASE) MCG/ACT inhaler   Inhalation   Inhale 2 puffs into the lungs every 6 (six) hours as needed for wheezing or shortness of breath.         . hydroxychloroquine (PLAQUENIL) 200 MG tablet   Oral   Take 200 mg by mouth 2 (two) times daily.         Marland Kitchen  levalbuterol (XOPENEX HFA) 45 MCG/ACT inhaler   Inhalation   Inhale 1-2 puffs into the lungs every 4 (four) hours as needed for wheezing or shortness of breath.          . naproxen sodium (ANAPROX) 220 MG tablet   Oral   Take 220 mg by mouth daily as needed (pain).         Marland Kitchen omeprazole (PRILOSEC) 20 MG capsule   Oral   Take 20 mg by mouth daily as needed (acid reflux).          . predniSONE (DELTASONE) 5 MG tablet   Oral   Take 2.5 mg by mouth every other day.            BP 131/79  Pulse 70  Temp(Src) 98.9 F (37.2 C) (Oral)  Resp 20  SpO2 100% Physical Exam  Nursing note and vitals reviewed. Constitutional: She is oriented to person, place, and time. She appears well-developed and well-nourished. No distress.  HENT:  Head: Normocephalic and atraumatic.  Right Ear: External ear normal.  Left Ear: External ear normal.  Nose: Nose normal.  Mouth/Throat: No oropharyngeal exudate.  Mild posterior pharyngeal erythema. No peritonsilar abscess  Eyes: Conjunctivae are normal. Pupils are equal, round, and reactive to light. No scleral icterus.  Neck: Normal range of motion. Neck supple.  Cardiovascular: Normal rate, regular rhythm and normal heart sounds.  Exam reveals no gallop and no friction rub.   No murmur heard. Pulmonary/Chest: Effort normal and breath sounds normal. No respiratory distress. She has no wheezes. She has no rales. She exhibits no tenderness.  Abdominal: Soft. Bowel sounds are normal. She exhibits no distension. There is no tenderness.  Musculoskeletal: Normal range of motion. She exhibits no edema and no tenderness.  Lymphadenopathy:    She has no cervical adenopathy.  Neurological: She is alert and oriented to person, place, and time. She exhibits normal muscle tone. Coordination normal.  Skin: Skin is warm and dry. No rash noted. No erythema. No pallor.  Psychiatric: She has a normal mood and affect. Her behavior is normal. Judgment and thought content normal.    ED Course  Procedures (including critical care time) Labs Review Labs Reviewed  RAPID STREP SCREEN  CULTURE, GROUP A STREP   Imaging Review Dg Chest 2 View  02/08/2013   CLINICAL DATA:  Cough and sore throat.  EXAM: CHEST  2 VIEW  COMPARISON:  Chest radiograph performed 11/25/2012  FINDINGS: The lungs are well-aerated and clear. There is no evidence of focal opacification, pleural effusion or pneumothorax.  The heart is normal in size; the mediastinal  contour is within normal limits. No acute osseous abnormalities are seen. Cervical spinal fusion hardware is noted.  IMPRESSION: No acute cardiopulmonary process seen.   Electronically Signed   By: Roanna Raider M.D.   On: 02/08/2013 06:52    EKG Interpretation   None      Results for orders placed during the hospital encounter of 02/08/13  RAPID STREP SCREEN      Result Value Range   Streptococcus, Group A Screen (Direct) NEGATIVE  NEGATIVE   Dg Chest 2 View  02/08/2013   CLINICAL DATA:  Cough and sore throat.  EXAM: CHEST  2 VIEW  COMPARISON:  Chest radiograph performed 11/25/2012  FINDINGS: The lungs are well-aerated and clear. There is no evidence of focal opacification, pleural effusion or pneumothorax.  The heart is normal in size; the mediastinal contour is within normal limits. No acute osseous  abnormalities are seen. Cervical spinal fusion hardware is noted.  IMPRESSION: No acute cardiopulmonary process seen.   Electronically Signed   By: Roanna Raider M.D.   On: 02/08/2013 06:52     MDM  Viral pharyngitis  Patient here with sore throat, low grade fever, pain with swallowing and cough, strep negative, doubt PTA, [place]igs angina, CAP.  X-ray negative - given decadron 10mg  IM here, will give cough medication.   Izola Price, Marisue Humble 02/08/13 [phone]

## 2013-02-08 NOTE — ED Notes (Signed)
Pt. reports sore throat with occasional dry cough onset last week , denies fever or chills. Respirations unlabored / airway intact.

## 2013-02-10 LAB — CULTURE, GROUP A STREP

## 2013-02-10 NOTE — ED Provider Notes (Signed)
Medical screening examination/treatment/procedure(s) were performed by non-physician practitioner and as supervising physician I was immediately available for consultation/collaboration.  EKG Interpretation   None         Candyce Churn, MD 02/10/13 [phone]

## 2013-03-27 ENCOUNTER — Other Ambulatory Visit: Payer: Self-pay

## 2013-03-27 DIAGNOSIS — Z1231 Encounter for screening mammogram for malignant neoplasm of breast: Secondary | ICD-10-CM

## 2013-04-10 ENCOUNTER — Inpatient Hospital Stay: Admission: RE | Admit: 2013-04-10 | Payer: BC Managed Care – PPO | Source: Ambulatory Visit

## 2013-04-17 ENCOUNTER — Ambulatory Visit
Admission: RE | Admit: 2013-04-17 | Discharge: 2013-04-17 | Disposition: A | Discharge status: Home / Self Care | Payer: BC Managed Care – PPO | Source: Ambulatory Visit

## 2013-04-17 DIAGNOSIS — Z1231 Encounter for screening mammogram for malignant neoplasm of breast: Secondary | ICD-10-CM

## 2013-08-21 ENCOUNTER — Encounter (HOSPITAL_COMMUNITY): Payer: Self-pay | Admitting: Emergency Medicine

## 2013-08-21 ENCOUNTER — Emergency Department (HOSPITAL_COMMUNITY)
Admission: EM | Admit: 2013-08-21 | Discharge: 2013-08-22 | Disposition: A | Discharge status: Home / Self Care | Payer: BC Managed Care – PPO | Attending: Emergency Medicine | Admitting: Emergency Medicine

## 2013-08-21 DIAGNOSIS — D649 Anemia, unspecified: Secondary | ICD-10-CM | POA: Insufficient documentation

## 2013-08-21 DIAGNOSIS — I10 Essential (primary) hypertension: Secondary | ICD-10-CM | POA: Insufficient documentation

## 2013-08-21 DIAGNOSIS — Z79899 Other long term (current) drug therapy: Secondary | ICD-10-CM | POA: Insufficient documentation

## 2013-08-21 DIAGNOSIS — IMO0002 Reserved for concepts with insufficient information to code with codable children: Secondary | ICD-10-CM | POA: Insufficient documentation

## 2013-08-21 DIAGNOSIS — Z862 Personal history of diseases of the blood and blood-forming organs and certain disorders involving the immune mechanism: Secondary | ICD-10-CM | POA: Insufficient documentation

## 2013-08-21 DIAGNOSIS — Z8639 Personal history of other endocrine, nutritional and metabolic disease: Secondary | ICD-10-CM | POA: Insufficient documentation

## 2013-08-21 DIAGNOSIS — R519 Headache, unspecified: Secondary | ICD-10-CM

## 2013-08-21 DIAGNOSIS — R197 Diarrhea, unspecified: Secondary | ICD-10-CM | POA: Insufficient documentation

## 2013-08-21 DIAGNOSIS — K219 Gastro-esophageal reflux disease without esophagitis: Secondary | ICD-10-CM | POA: Insufficient documentation

## 2013-08-21 DIAGNOSIS — J45909 Unspecified asthma, uncomplicated: Secondary | ICD-10-CM | POA: Insufficient documentation

## 2013-08-21 DIAGNOSIS — R112 Nausea with vomiting, unspecified: Secondary | ICD-10-CM | POA: Insufficient documentation

## 2013-08-21 DIAGNOSIS — R51 Headache: Secondary | ICD-10-CM | POA: Insufficient documentation

## 2013-08-21 LAB — URINALYSIS, ROUTINE W REFLEX MICROSCOPIC
Bilirubin Urine: NEGATIVE
GLUCOSE, UA: NEGATIVE mg/dL
HGB URINE DIPSTICK: NEGATIVE
Ketones, ur: NEGATIVE mg/dL
Leukocytes, UA: NEGATIVE
Nitrite: NEGATIVE
Protein, ur: NEGATIVE mg/dL
SPECIFIC GRAVITY, URINE: 1.017 (ref 1.005–1.030)
Urobilinogen, UA: 1 mg/dL (ref 0.0–1.0)
pH: 5.5 (ref 5.0–8.0)

## 2013-08-21 LAB — CBC WITH DIFFERENTIAL/PLATELET
BASOS PCT: 0 % (ref 0–1)
Basophils Absolute: 0 10*3/uL (ref 0.0–0.1)
EOS ABS: 0.1 10*3/uL (ref 0.0–0.7)
Eosinophils Relative: 2 % (ref 0–5)
HCT: 39.9 % (ref 36.0–46.0)
HEMOGLOBIN: 12.6 g/dL (ref 12.0–15.0)
LYMPHS ABS: 1 10*3/uL (ref 0.7–4.0)
Lymphocytes Relative: 27 % (ref 12–46)
MCH: 27.3 pg (ref 26.0–34.0)
MCHC: 31.6 g/dL (ref 30.0–36.0)
MCV: 86.4 fL (ref 78.0–100.0)
MONOS PCT: 8 % (ref 3–12)
Monocytes Absolute: 0.3 10*3/uL (ref 0.1–1.0)
NEUTROS ABS: 2.3 10*3/uL (ref 1.7–7.7)
NEUTROS PCT: 63 % (ref 43–77)
PLATELETS: 287 10*3/uL (ref 150–400)
RBC: 4.62 MIL/uL (ref 3.87–5.11)
RDW: 14.7 % (ref 11.5–15.5)
WBC: 3.7 10*3/uL — ABNORMAL LOW (ref 4.0–10.5)

## 2013-08-21 LAB — COMPREHENSIVE METABOLIC PANEL
ALK PHOS: 56 U/L (ref 39–117)
ALT: 15 U/L (ref 0–35)
AST: 16 U/L (ref 0–37)
Albumin: 3.8 g/dL (ref 3.5–5.2)
Anion gap: 14 (ref 5–15)
BUN: 7 mg/dL (ref 6–23)
CALCIUM: 9.1 mg/dL (ref 8.4–10.5)
CO2: 24 meq/L (ref 19–32)
Chloride: 104 mEq/L (ref 96–112)
Creatinine, Ser: 0.6 mg/dL (ref 0.50–1.10)
GFR calc Af Amer: >90 mL/min (ref >90)
Glucose, Bld: 84 mg/dL (ref 70–99)
Potassium: 3.4 mEq/L — ABNORMAL LOW (ref 3.7–5.3)
SODIUM: 142 meq/L (ref 137–147)
TOTAL PROTEIN: 7.5 g/dL (ref 6.0–8.3)
Total Bilirubin: 0.7 mg/dL (ref 0.3–1.2)

## 2013-08-21 MED ORDER — SODIUM CHLORIDE 0.9 % IV BOLUS (SEPSIS): 1000.0000 mL | Freq: Once | INTRAVENOUS | Status: DC

## 2013-08-21 NOTE — ED Notes (Signed)
Pt. reports emesis , diarrhea and chills with headache onset 2 days ago .

## 2013-08-22 MED ORDER — METOCLOPRAMIDE HCL 5 MG/ML IJ SOLN
10.0000 mg | Freq: Once | INTRAMUSCULAR | Status: AC
  Administered 2013-08-22: 10 mg via INTRAVENOUS
  Filled 2013-08-22: qty 2

## 2013-08-22 MED ORDER — DIPHENHYDRAMINE HCL 50 MG/ML IJ SOLN
25.0000 mg | Freq: Once | INTRAMUSCULAR | Status: AC
  Administered 2013-08-22: 25 mg via INTRAVENOUS
  Filled 2013-08-22: qty 1

## 2013-08-22 MED ORDER — SODIUM CHLORIDE 0.9 % IV SOLN
1000.0000 mL | Freq: Once | INTRAVENOUS | Status: AC
  Administered 2013-08-22: 1000 mL via INTRAVENOUS

## 2013-08-22 MED ORDER — METOCLOPRAMIDE HCL 10 MG PO TABS: 10.0000 mg | ORAL_TABLET | Freq: Four times a day (QID) | ORAL | Status: DC | PRN

## 2013-08-22 MED ORDER — SODIUM CHLORIDE 0.9 % IV SOLN: 1000.0000 mL | INTRAVENOUS | Status: DC

## 2013-08-22 NOTE — Discharge Instructions (Signed)
Take loperamide (Imodium AD) as needed for diarrhea.  Nausea and Vomiting Nausea is a sick feeling that often comes before throwing up (vomiting). Vomiting is a reflex where stomach contents come out of your mouth. Vomiting can cause severe loss of body fluids (dehydration). Children and elderly adults can become dehydrated quickly, especially if they also have diarrhea. Nausea and vomiting are symptoms of a condition or disease. It is important to find the cause of your symptoms. CAUSES   Direct irritation of the stomach lining. This irritation can result from increased acid production (gastroesophageal reflux disease), infection, food poisoning, taking certain medicines (such as nonsteroidal anti-inflammatory drugs), alcohol use, or tobacco use.  Signals from the brain.These signals could be caused by a headache, heat exposure, an inner ear disturbance, increased pressure in the brain from injury, infection, a tumor, or a concussion, pain, emotional stimulus, or metabolic problems.  An obstruction in the gastrointestinal tract (bowel obstruction).  Illnesses such as diabetes, hepatitis, gallbladder problems, appendicitis, kidney problems, cancer, sepsis, atypical symptoms of a heart attack, or eating disorders.  Medical treatments such as chemotherapy and radiation.  Receiving medicine that makes you sleep (general anesthetic) during surgery. DIAGNOSIS Your caregiver may ask for tests to be done if the problems do not improve after a few days. Tests may also be done if symptoms are severe or if the reason for the nausea and vomiting is not clear. Tests may include:  Urine tests.  Blood tests.  Stool tests.  Cultures (to look for evidence of infection).  X-rays or other imaging studies. Test results can help your caregiver make decisions about treatment or the need for additional tests. TREATMENT You need to stay well hydrated. Drink frequently but in small amounts.You may wish to  drink water, sports drinks, clear broth, or eat frozen ice pops or gelatin dessert to help stay hydrated.When you eat, eating slowly may help prevent nausea.There are also some antinausea medicines that may help prevent nausea. HOME CARE INSTRUCTIONS   Take all medicine as directed by your caregiver.  If you do not have an appetite, do not force yourself to eat. However, you must continue to drink fluids.  If you have an appetite, eat a normal diet unless your caregiver tells you differently.  Eat a variety of complex carbohydrates (rice, wheat, potatoes, bread), lean meats, yogurt, fruits, and vegetables.  Avoid high-fat foods because they are more difficult to digest.  Drink enough water and fluids to keep your urine clear or pale yellow.  If you are dehydrated, ask your caregiver for specific rehydration instructions. Signs of dehydration may include:  Severe thirst.  Dry lips and mouth.  Dizziness.  Dark urine.  Decreasing urine frequency and amount.  Confusion.  Rapid breathing or pulse. SEEK IMMEDIATE MEDICAL CARE IF:   You have blood or brown flecks (like coffee grounds) in your vomit.  You have black or bloody stools.  You have a severe headache or stiff neck.  You are confused.  You have severe abdominal pain.  You have chest pain or trouble breathing.  You do not urinate at least once every 8 hours.  You develop cold or clammy skin.  You continue to vomit for longer than 24 to 48 hours.  You have a fever. MAKE SURE YOU:   Understand these instructions.  Will watch your condition.  Will get help right away if you are not doing well or get worse. Document Released: 01/05/2005 Document Revised: 03/30/2011 Document Reviewed: 06/04/2010 ExitCare Patient  Information ©2015 [employer]. This information is not intended to replace advice given to you by your health care provider. Make sure you discuss any questions you have with your health care  provider. ° °Diarrhea °Diarrhea is frequent loose and watery bowel movements. It can cause you to feel weak and dehydrated. Dehydration can cause you to become tired and thirsty, have a dry mouth, and have decreased urination that often is dark yellow. Diarrhea is a sign of another problem, most often an infection that will not last long. In most cases, diarrhea typically lasts 2-3 days. However, it can last longer if it is a sign of something more serious. It is important to treat your diarrhea as directed by your caregiver to lessen or prevent future episodes of diarrhea. °CAUSES  °Some common causes include: °· Gastrointestinal infections caused by viruses, bacteria, or parasites. °· Food poisoning or food allergies. °· Certain medicines, such as antibiotics, chemotherapy, and laxatives. °· Artificial sweeteners and fructose. °· Digestive disorders. °HOME CARE INSTRUCTIONS °· Ensure adequate fluid intake (hydration): Have 1 cup (8 oz) of fluid for each diarrhea episode. Avoid fluids that contain simple sugars or sports drinks, fruit juices, whole milk products, and sodas. Your urine should be clear or pale yellow if you are drinking enough fluids. Hydrate with an oral rehydration solution that you can purchase at pharmacies, retail stores, and online. You can prepare an oral rehydration solution at home by mixing the following ingredients together: °¨  - tsp table salt. °¨ ¾ tsp baking soda. °¨  tsp salt substitute containing potassium chloride. °¨ 1  tablespoons sugar. °¨ 1 L (34 oz) of water. °· Certain foods and beverages may increase the speed at which food moves through the gastrointestinal (GI) tract. These foods and beverages should be avoided and include: °¨ Caffeinated and alcoholic beverages. °¨ High-fiber foods, such as raw fruits and vegetables, nuts, seeds, and whole grain breads and cereals. °¨ Foods and beverages sweetened with sugar alcohols, such as xylitol, sorbitol, and mannitol. °· Some foods  may be well tolerated and may help thicken stool including: °¨ Starchy foods, such as rice, toast, pasta, low-sugar cereal, oatmeal, grits, baked potatoes, crackers, and bagels. °¨ Bananas. °¨ Applesauce. °· Add probiotic-rich foods to help increase healthy bacteria in the GI tract, such as yogurt and fermented milk products. °· Wash your hands well after each diarrhea episode. °· Only take over-the-counter or prescription medicines as directed by your caregiver. °· Take a warm bath to relieve any burning or pain from frequent diarrhea episodes. °SEEK IMMEDIATE MEDICAL CARE IF:  °· You are unable to keep fluids down. °· You have persistent vomiting. °· You have blood in your stool, or your stools are black and tarry. °· You do not urinate in 6-8 hours, or there is only a small amount of very dark urine. °· You have abdominal pain that increases or localizes. °· You have weakness, dizziness, confusion, or light-headedness. °· You have a severe headache. °· Your diarrhea gets worse or does not get better. °· You have a fever or persistent symptoms for more than 2-3 days. °· You have a fever and your symptoms suddenly get worse. °MAKE SURE YOU:  °· Understand these instructions. °· Will watch your condition. °· Will get help right away if you are not doing well or get worse. °Document Released: 12/26/2001 Document Revised: 05/22/2013 Document Reviewed: 09/13/2011 °ExitCare® Patient Information ©2015 [employer]. This information is not intended to replace advice given to you   by your health care provider. Make sure you discuss any questions you have with your health care provider.  General Headache Without Cause A headache is pain or discomfort felt around the head or neck area. The specific cause of a headache may not be found. There are many causes and types of headaches. A few common ones are:  Tension headaches.  Migraine headaches.  Cluster headaches.  Chronic daily headaches. HOME CARE INSTRUCTIONS     Keep all follow-up appointments with your caregiver or any specialist referral.  Only take over-the-counter or prescription medicines for pain or discomfort as directed by your caregiver.  Lie down in a dark, quiet room when you have a headache.  Keep a headache journal to find out what may trigger your migraine headaches. For example, write down:  What you eat and drink.  How much sleep you get.  Any change to your diet or medicines.  Try massage or other relaxation techniques.  Put ice packs or heat on the head and neck. Use these 3 to 4 times per day for 15 to 20 minutes each time, or as needed.  Limit stress.  Sit up straight, and do not tense your muscles.  Quit smoking if you smoke.  Limit alcohol use.  Decrease the amount of caffeine you drink, or stop drinking caffeine.  Eat and sleep on a regular schedule.  Get 7 to 9 hours of sleep, or as recommended by your caregiver.  Keep lights dim if bright lights bother you and make your headaches worse. SEEK MEDICAL CARE IF:   You have problems with the medicines you were prescribed.  Your medicines are not working.  You have a change from the usual headache.  You have nausea or vomiting. SEEK IMMEDIATE MEDICAL CARE IF:   Your headache becomes severe.  You have a fever.  You have a stiff neck.  You have loss of vision.  You have muscular weakness or loss of muscle control.  You start losing your balance or have trouble walking.  You feel faint or pass out.  You have severe symptoms that are different from your first symptoms. MAKE SURE YOU:   Understand these instructions.  Will watch your condition.  Will get help right away if you are not doing well or get worse. Document Released: 01/05/2005 Document Revised: 03/30/2011 Document Reviewed: 01/21/2011 [facility] Patient Information 2015 [place], [place]. This information is not intended to replace advice given to you by your health care provider.  Make sure you discuss any questions you have with your health care provider.  Metoclopramide tablets What is this medicine? METOCLOPRAMIDE (met oh kloe PRA mide) is used to treat the symptoms of gastroesophageal reflux disease (GERD) like heartburn. It is also used to treat people with slow emptying of the stomach and intestinal tract. This medicine may be used for other purposes; ask your health care provider or pharmacist if you have questions. COMMON BRAND NAME(S): Reglan What should I tell my health care provider before I take this medicine? They need to know if you have any of these conditions: -breast cancer -depression -diabetes -heart failure -high blood pressure -kidney disease -liver disease -Parkinson's disease or a movement disorder -pheochromocytoma -seizures -stomach obstruction, bleeding, or perforation -an unusual or allergic reaction to metoclopramide, procainamide, sulfites, other medicines, foods, dyes, or preservatives -pregnant or trying to get pregnant -breast-feeding How should I use this medicine? Take this medicine by mouth with a glass of water. Follow the directions on the prescription label.  Take this medicine on an empty stomach, about 30 minutes before eating. Take your doses at regular intervals. Do not take your medicine more often than directed. Do not stop taking except on the advice of your doctor or health care professional. A special MedGuide will be given to you by the pharmacist with each prescription and refill. Be sure to read this information carefully each time. Talk to your pediatrician regarding the use of this medicine in children. Special care may be needed. Overdosage: If you think you have taken too much of this medicine contact a poison control center or emergency room at once. NOTE: This medicine is only for you. Do not share this medicine with others. What if I miss a dose? If you miss a dose, take it as soon as you can. If it is  almost time for your next dose, take only that dose. Do not take double or extra doses. What may interact with this medicine? -acetaminophen -cyclosporine -digoxin -medicines for blood pressure -medicines for diabetes, including insulin -medicines for hay fever and other allergies -medicines for depression, especially an Monoamine Oxidase Inhibitor (MAOI) -medicines for Parkinson's disease, like levodopa -medicines for sleep or for pain -tetracycline This list may not describe all possible interactions. Give your health care provider a list of all the medicines, herbs, non-prescription drugs, or dietary supplements you use. Also tell them if you smoke, drink alcohol, or use illegal drugs. Some items may interact with your medicine. What should I watch for while using this medicine? It may take a few weeks for your stomach condition to start to get better. However, do not take this medicine for longer than 12 weeks. The longer you take this medicine, and the more you take it, the greater your chances are of developing serious side effects. If you are an elderly patient, a female patient, or you have diabetes, you may be at an increased risk for side effects from this medicine. Contact your doctor immediately if you start having movements you cannot control such as lip smacking, rapid movements of the tongue, involuntary or uncontrollable movements of the eyes, head, arms and legs, or muscle twitches and spasms. Patients and their families should watch out for worsening depression or thoughts of suicide. Also watch out for any sudden or severe changes in feelings such as feeling anxious, agitated, panicky, irritable, hostile, aggressive, impulsive, severely restless, overly excited and hyperactive, or not being able to sleep. If this happens, especially at the beginning of treatment or after a change in dose, call your doctor. Do not treat yourself for high fever. Ask your doctor or health care  professional for advice. You may get drowsy or dizzy. Do not drive, use machinery, or do anything that needs mental alertness until you know how this drug affects you. Do not stand or sit up quickly, especially if you are an older patient. This reduces the risk of dizzy or fainting spells. Alcohol can make you more drowsy and dizzy. Avoid alcoholic drinks. What side effects may I notice from receiving this medicine? Side effects that you should report to your doctor or health care professional as soon as possible: -allergic reactions like skin rash, itching or hives, swelling of the face, lips, or tongue -abnormal production of milk in females -breast enlargement in both males and females -change in the way you walk -difficulty moving, speaking or swallowing -drooling, lip smacking, or rapid movements of the tongue -excessive sweating -fever -involuntary or uncontrollable movements of the eyes, head,  arms and legs -irregular heartbeat or palpitations -muscle twitches and spasms -unusually weak or tired Side effects that usually do not require medical attention (report to your doctor or health care professional if they continue or are bothersome): -change in sex drive or performance -depressed mood -diarrhea -difficulty sleeping -headache -menstrual changes -restless or nervous This list may not describe all possible side effects. Call your doctor for medical advice about side effects. You may report side effects to FDA at 1-800-FDA-1088. Where should I keep my medicine? Keep out of the reach of children. Store at room temperature between 20 and 25 degrees C (68 and 77 degrees F). Protect from light. Keep container tightly closed. Throw away any unused medicine after the expiration date. NOTE: This sheet is a summary. It may not cover all possible information. If you have questions about this medicine, talk to your doctor, pharmacist, or health care provider.  2015, [employer]  [employer]. (2011-05-05 13:04:38)

## 2013-08-22 NOTE — ED Provider Notes (Signed)
CSN: [id number]     Arrival date & time 08/21/13  1951 History   First MD Initiated Contact with Patient 08/21/13 2142     Chief Complaint  Patient presents with  . Emesis  . Diarrhea     (Consider location/radiation/quality/duration/timing/severity/associated sxs/prior Treatment) Patient is a 53 y.o. female presenting with vomiting and diarrhea. The history is provided by the patient.  Emesis Associated symptoms: diarrhea   Diarrhea Associated symptoms: vomiting   She had onset about 6:30 AM of nausea, vomiting, diarrhea. She developed a headache following onset of the vomiting and diarrhea. She is vomited numerous times and had numerous episodes of diarrhea. There's been no blood in stool or emesis. She denies fever, chills, sweats. She denies arthralgias or myalgias. She denies sick contacts. She has not taken anything to treat her symptoms.  Past Medical History  Diagnosis Date  . Achalasia   . Vertigo   . History of stomach ulcers   . GERD (gastroesophageal reflux disease)   . HTN (hypertension)   . Anemia   . Asthma   . Thyroid mass    Past Surgical History  Procedure Laterality Date  . Cesarean section    . Cervical discectomy      plates and screws in neck  . Exc benign breast lump    . Esophagogastroduodenoscopy  03/04/2011    Procedure: ESOPHAGOGASTRODUODENOSCOPY (EGD);  Surgeon: Louis Meckel, MD;  Location: [facility];  Service: Endoscopy;  Laterality: N/A;  BOTOX Injection  . Tonsillectomy    . Abdominal hysterectomy      done for menorrhagia, ovaries remain   Family History  Problem Relation Age of Onset  . Alcohol abuse Father   . Hypertension Father   . Heart disease Father    History  Substance Use Topics  . Smoking status: Never Smoker   . Smokeless tobacco: Never Used  . Alcohol Use: No   OB History   Grav Para Term Preterm Abortions TAB SAB Ect Mult Living   5 3 3             Review of Systems  Gastrointestinal: Positive for vomiting and  diarrhea.  All other systems reviewed and are negative.     Allergies  Aspirin; Hydrocodone-acetaminophen; Ibuprofen; Imdur; Meperidine hcl; Morphine; Oxycodone-acetaminophen; Procaine hcl; Toprol xl; and Tramadol  Home Medications   Prior to Admission medications   Medication Sig Start Date End Date Taking? Authorizing Provider  albuterol (PROVENTIL HFA;VENTOLIN HFA) 108 (90 BASE) MCG/ACT inhaler Inhale 2 puffs into the lungs every 6 (six) hours as needed for wheezing or shortness of breath.   Yes Historical Provider, MD  folic acid (FOLVITE) 1 MG tablet Take 1 mg by mouth 2 (two) times daily.   Yes Historical Provider, MD  hydroxychloroquine (PLAQUENIL) 200 MG tablet Take 200 mg by mouth 2 (two) times daily.   Yes Historical Provider, MD  levalbuterol [facility] HFA) 45 MCG/ACT inhaler Inhale 1-2 puffs into the lungs every 4 (four) hours as needed for wheezing or shortness of breath.    Yes Historical Provider, MD  Methotrexate, PF, (OTREXUP) 20 MG/0.4ML SOAJ Inject into the skin every 7 (seven) days. Patient is unsure of how many units she takes on Friday s   Yes Historical Provider, MD  omeprazole (PRILOSEC) 20 MG capsule Take 20 mg by mouth daily as needed (acid reflux).    Yes Historical Provider, MD  predniSONE (DELTASONE) 5 MG tablet Take 5 mg by mouth 4 (four) times daily.  08/04/12  Yes Historical Provider, MD   BP 140/90  Pulse 85  Temp(Src) 98 F (36.7 C) (Oral)  Resp 20  Ht 5' 5.5" (1.664 m)  Wt 232 lb (105.235 kg)  BMI 38.01 kg/m2  SpO2 99% Physical Exam  Nursing note and vitals reviewed.  53 year old female, resting comfortably and in no acute distress. Vital signs are significant for borderline hypertension with blood pressure 140/90. Oxygen saturation is 99%, which is normal. Head is normocephalic and atraumatic. PERRLA, EOMI. Oropharynx is clear. Neck is nontender and supple without adenopathy or JVD. Back is nontender and there is no CVA tenderness. Lungs are  clear without rales, wheezes, or rhonchi. Chest is nontender. Heart has regular rate and rhythm without murmur. Abdomen is soft, flat, nontender without masses or hepatosplenomegaly and peristalsis is hyperoactive. Extremities have no cyanosis or edema, full range of motion is present. Skin is warm and dry without rash. Neurologic: Mental status is normal, cranial nerves are intact, there are no motor or sensory deficits.  ED Course  Procedures (including critical care time) Labs Review Results for orders placed during the hospital encounter of 08/21/13  CBC WITH DIFFERENTIAL      Result Value Ref Range   WBC 3.7 (*) 4.0 - 10.5 K/uL   RBC 4.62  3.87 - 5.11 MIL/uL   Hemoglobin 12.6  12.0 - 15.0 g/dL   HCT 03.4  91.7 - 91.5 %   MCV 86.4  78.0 - 100.0 fL   MCH 27.3  26.0 - 34.0 pg   MCHC 31.6  30.0 - 36.0 g/dL   RDW 05.6  97.9 - 48.0 %   Platelets 287  150 - 400 K/uL   Neutrophils Relative % 63  43 - 77 %   Neutro Abs 2.3  1.7 - 7.7 K/uL   Lymphocytes Relative 27  12 - 46 %   Lymphs Abs 1.0  0.7 - 4.0 K/uL   Monocytes Relative 8  3 - 12 %   Monocytes Absolute 0.3  0.1 - 1.0 K/uL   Eosinophils Relative 2  0 - 5 %   Eosinophils Absolute 0.1  0.0 - 0.7 K/uL   Basophils Relative 0  0 - 1 %   Basophils Absolute 0.0  0.0 - 0.1 K/uL  COMPREHENSIVE METABOLIC PANEL      Result Value Ref Range   Sodium 142  137 - 147 mEq/L   Potassium 3.4 (*) 3.7 - 5.3 mEq/L   Chloride 104  96 - 112 mEq/L   CO2 24  19 - 32 mEq/L   Glucose, Bld 84  70 - 99 mg/dL   BUN 7  6 - 23 mg/dL   Creatinine, Ser 1.65  0.50 - 1.10 mg/dL   Calcium 9.1  8.4 - 53.7 mg/dL   Total Protein 7.5  6.0 - 8.3 g/dL   Albumin 3.8  3.5 - 5.2 g/dL   AST 16  0 - 37 U/L   ALT 15  0 - 35 U/L   Alkaline Phosphatase 56  39 - 117 U/L   Total Bilirubin 0.7  0.3 - 1.2 mg/dL   GFR calc non Af Amer >90  >90 mL/min   GFR calc Af Amer >90  >90 mL/min   Anion gap 14  5 - 15  URINALYSIS, ROUTINE W REFLEX MICROSCOPIC      Result Value  Ref Range   Color, Urine YELLOW  YELLOW   APPearance CLEAR  CLEAR   Specific Gravity, Urine 1.017  1.005 - 1.030   pH 5.5  5.0 - 8.0   Glucose, UA NEGATIVE  NEGATIVE mg/dL   Hgb urine dipstick NEGATIVE  NEGATIVE   Bilirubin Urine NEGATIVE  NEGATIVE   Ketones, ur NEGATIVE  NEGATIVE mg/dL   Protein, ur NEGATIVE  NEGATIVE mg/dL   Urobilinogen, UA 1.0  0.0 - 1.0 mg/dL   Nitrite NEGATIVE  NEGATIVE   Leukocytes, UA NEGATIVE  NEGATIVE    MDM   Final diagnoses:  Nausea vomiting and diarrhea  Headache, unspecified headache type    Nausea, vomiting, diarrhea in a pattern consistent with viral gastroenteritis. Headache is possibly a migraine variant and possibly related to dehydration. Initial laboratory workup is normal without evidence of significant dehydration. She'll be given IV fluids and IV metoclopramide with diphenhydramine.  She feels much better after above noted treatment. She is discharged with prescription for metoclopramide and told to use over-the-counter loperamide as needed.  Dione Booze, MD 08/22/13 [phone]

## 2013-08-22 NOTE — ED Notes (Signed)
Pt discharged home with all belongings, pt alert, oriented and ambulatory upon discharge. Pt escorted to car via wheel chair by [employer] 1 new RX prescribed, pt verbalizes understanding of discharge instructions. Pt transported home by spouse

## 2013-08-22 NOTE — ED Notes (Signed)
Pt presents with generalized abd pain, nausea, vomiting, and diarrhea starting yesterday am @ 0630. Pt states she has not been able to eat or drink since Sunday night, states she started feeling "blah" Sunday night. Pt states she developed a headache last night

## 2013-08-22 NOTE — ED Notes (Signed)
Unable to stop fluids or discontinue fluids. A total of 2000 NS was given

## 2013-11-20 ENCOUNTER — Encounter (HOSPITAL_COMMUNITY): Payer: Self-pay | Admitting: Emergency Medicine

## 2013-12-01 ENCOUNTER — Ambulatory Visit (INDEPENDENT_AMBULATORY_CARE_PROVIDER_SITE_OTHER): Payer: BC Managed Care – PPO | Admitting: Family Medicine

## 2013-12-01 ENCOUNTER — Encounter: Payer: Self-pay | Admitting: Family Medicine

## 2013-12-01 VITALS — BP 130/80 | HR 88 | Temp 98.0°F | Ht 65.0 in | Wt 234.5 lb

## 2013-12-01 DIAGNOSIS — M542 Cervicalgia: Secondary | ICD-10-CM | POA: Insufficient documentation

## 2013-12-01 DIAGNOSIS — E041 Nontoxic single thyroid nodule: Secondary | ICD-10-CM

## 2013-12-01 NOTE — Addendum Note (Signed)
Addended by: Alvina Chou on: 12/01/2013 05:15 PM   Modules accepted: Orders

## 2013-12-01 NOTE — Patient Instructions (Addendum)
Stop at front desk to set up thyroid US.  Stop at lab on way out for TSH. Call to schedule follow up appt with Dr. Elvera Lennox.[phone]

## 2013-12-01 NOTE — Progress Notes (Signed)
Subjective:    Patient ID: Patricia Reed, female    DOB: November 20, 1960, 53 y.o.   MRN: [id number]  HPI  53 year old pt with history of benign thyroid nodules presents with new onset pain and increase in size in nodule in right thyroid.  The pt does not have a h/o thyroid disease. She was told she had a thyroid nodule in 2009. She had a recent MRI scan (05/10/2012) of neck for cervical radiculopathy (has a h/o cervical fusion Sx) and it was noticed that she had a right 2.5 x 3.0 cm thyroid mass vs goiter. Left lobe of the thyroid appeared normal. This was similar to the appearance of the thyroid on a previous cervical MRI performed on 04/30/2007.  She saw Dr. Wyonia Hough  in 07/2012 for US guided bx of thyroid nodule. Found to be benign.   She has now noted constant throbbing pain in right anterior neck in last 3 months When she swallows it hurts and she also notes pain in ear. 4/10 on pain scale. Increased significantly in size in last 6-9 months. When she lies back at night it interferes with sleep given pressure and pain. No fever.  No heat, no redness.   Review of Systems  Constitutional: Negative for fever and fatigue.  HENT: Positive for ear pain and mouth sores. Negative for facial swelling, nosebleeds and postnasal drip.   Eyes: Negative for pain.  Respiratory: Negative for shortness of breath.   Cardiovascular: Negative for chest pain and leg swelling.       Objective:   Physical Exam  Constitutional: Vital signs are normal. She appears well-developed and well-nourished. She is cooperative.  Non-toxic appearance. She does not appear ill. No distress.  HENT:  Head: Normocephalic.  Right Ear: Hearing, tympanic membrane, external ear and ear canal normal. Tympanic membrane is not erythematous, not retracted and not bulging. No middle ear effusion.  Left Ear: Hearing, tympanic membrane, external ear and ear canal normal. Tympanic membrane is not erythematous, not retracted and not  bulging.  Nose: No mucosal edema or rhinorrhea. Right sinus exhibits no maxillary sinus tenderness and no frontal sinus tenderness. Left sinus exhibits no maxillary sinus tenderness and no frontal sinus tenderness.  Mouth/Throat: Uvula is midline, oropharynx is clear and moist and mucous membranes are normal.  Eyes: Conjunctivae, EOM and lids are normal. Pupils are equal, round, and reactive to light. Lids are everted and swept, no foreign bodies found.  Neck: Trachea normal and normal range of motion. Neck supple. Carotid bruit is not present. Thyroid mass present. No thyromegaly present.    Swelling and moderately severe pain, unable to palpate well  Cardiovascular: Normal rate, regular rhythm, S1 normal, S2 normal, normal heart sounds, intact distal pulses and normal pulses.  Exam reveals no gallop and no friction rub.   No murmur heard. Pulmonary/Chest: Effort normal and breath sounds normal. No tachypnea. No respiratory distress. She has no decreased breath sounds. She has no wheezes. She has no rhonchi. She has no rales.  Abdominal: Soft. Normal appearance and bowel sounds are normal. There is no tenderness.  Lymphadenopathy:       Head (right side): No submental, no submandibular, no tonsillar, no preauricular, no posterior auricular and no occipital adenopathy present.    She has no cervical adenopathy.  Neurological: She is alert.  Skin: Skin is warm, dry and intact. No rash noted.  Psychiatric: Her speech is normal and behavior is normal. Judgment and thought content normal. Her mood  appears not anxious. Cognition and memory are normal. She does not exhibit a depressed mood.          Assessment & Plan:

## 2013-12-01 NOTE — Progress Notes (Signed)
Pre visit review using our clinic review tool, if applicable. No additional management support is needed unless otherwise documented below in the visit note. 

## 2013-12-01 NOTE — Assessment & Plan Note (Addendum)
FNA of thyroid lesion last year benign, but now per pt lesion increasing in size and very painful. Unable to sleep at night.  NO sign of ear , tooth, jaw or lymph source of pain. Pain focally over right thyroid.  Will send for US thyroid and repeat TSH.  Recommend call to set up appt with ENDO again.  Will CC D.r Elvera Lennox

## 2013-12-02 LAB — TSH: TSH: 0.801 u[IU]/mL (ref 0.350–4.500)

## 2013-12-04 ENCOUNTER — Encounter: Payer: Self-pay | Admitting: *Deleted

## 2013-12-07 ENCOUNTER — Ambulatory Visit
Admission: RE | Admit: 2013-12-07 | Discharge: 2013-12-07 | Disposition: A | Discharge status: Home / Self Care | Payer: BC Managed Care – PPO | Source: Ambulatory Visit | Attending: Family Medicine | Admitting: Family Medicine

## 2013-12-07 DIAGNOSIS — E041 Nontoxic single thyroid nodule: Secondary | ICD-10-CM

## 2013-12-07 DIAGNOSIS — M542 Cervicalgia: Secondary | ICD-10-CM

## 2014-01-17 ENCOUNTER — Emergency Department (HOSPITAL_COMMUNITY): Payer: BC Managed Care – PPO

## 2014-01-17 ENCOUNTER — Encounter (HOSPITAL_COMMUNITY): Payer: Self-pay | Admitting: *Deleted

## 2014-01-17 ENCOUNTER — Emergency Department (HOSPITAL_COMMUNITY)
Admission: EM | Admit: 2014-01-17 | Discharge: 2014-01-17 | Disposition: A | Discharge status: Home / Self Care | Payer: BC Managed Care – PPO | Attending: Emergency Medicine | Admitting: Emergency Medicine

## 2014-01-17 DIAGNOSIS — J45909 Unspecified asthma, uncomplicated: Secondary | ICD-10-CM | POA: Insufficient documentation

## 2014-01-17 DIAGNOSIS — D649 Anemia, unspecified: Secondary | ICD-10-CM | POA: Diagnosis not present

## 2014-01-17 DIAGNOSIS — Z8639 Personal history of other endocrine, nutritional and metabolic disease: Secondary | ICD-10-CM | POA: Insufficient documentation

## 2014-01-17 DIAGNOSIS — Z7952 Long term (current) use of systemic steroids: Secondary | ICD-10-CM | POA: Insufficient documentation

## 2014-01-17 DIAGNOSIS — I252 Old myocardial infarction: Secondary | ICD-10-CM | POA: Insufficient documentation

## 2014-01-17 DIAGNOSIS — I10 Essential (primary) hypertension: Secondary | ICD-10-CM | POA: Insufficient documentation

## 2014-01-17 DIAGNOSIS — R079 Chest pain, unspecified: Secondary | ICD-10-CM | POA: Diagnosis present

## 2014-01-17 DIAGNOSIS — K219 Gastro-esophageal reflux disease without esophagitis: Secondary | ICD-10-CM | POA: Insufficient documentation

## 2014-01-17 DIAGNOSIS — Z79899 Other long term (current) drug therapy: Secondary | ICD-10-CM | POA: Diagnosis not present

## 2014-01-17 LAB — BASIC METABOLIC PANEL
Anion gap: 13 (ref 5–15)
BUN: 14 mg/dL (ref 6–23)
CO2: 21 mmol/L (ref 19–32)
Calcium: 9 mg/dL (ref 8.4–10.5)
Chloride: 105 mEq/L (ref 96–112)
Creatinine, Ser: 0.76 mg/dL (ref 0.50–1.10)
GFR calc Af Amer: >90 mL/min (ref >90)
GFR calc non Af Amer: >90 mL/min (ref >90)
Glucose, Bld: 103 mg/dL — ABNORMAL HIGH (ref 70–99)
Potassium: 3.3 mmol/L — ABNORMAL LOW (ref 3.5–5.1)
Sodium: 139 mmol/L (ref 135–145)

## 2014-01-17 LAB — CBC
HCT: 40.2 % (ref 36.0–46.0)
Hemoglobin: 12.5 g/dL (ref 12.0–15.0)
MCH: 27 pg (ref 26.0–34.0)
MCHC: 31.1 g/dL (ref 30.0–36.0)
MCV: 86.8 fL (ref 78.0–100.0)
Platelets: 279 10*3/uL (ref 150–400)
RBC: 4.63 MIL/uL (ref 3.87–5.11)
RDW: 14.1 % (ref 11.5–15.5)
WBC: 3.6 10*3/uL — ABNORMAL LOW (ref 4.0–10.5)

## 2014-01-17 LAB — I-STAT TROPONIN, ED: Troponin i, poc: 0 ng/mL (ref 0.00–0.08)

## 2014-01-17 MED ORDER — LORAZEPAM 1 MG PO TABS: 0.5000 mg | ORAL_TABLET | Freq: Three times a day (TID) | ORAL | Status: DC | PRN

## 2014-01-17 MED ORDER — PANTOPRAZOLE SODIUM 20 MG PO TBEC: 20.0000 mg | DELAYED_RELEASE_TABLET | Freq: Every day | ORAL | Status: DC

## 2014-01-17 NOTE — Discharge Instructions (Signed)

## 2014-01-17 NOTE — ED Provider Notes (Signed)
CSN: [id number]     Arrival date & time 01/17/14  1744 History   First MD Initiated Contact with Patient 01/17/14 1947     Chief Complaint  Patient presents with  . Chest Pain     (Consider location/radiation/quality/duration/timing/severity/associated sxs/prior Treatment) HPI   53 year old female chest pain. Symptom onset approximately 3 weeks ago. Symptoms wax and wanes, but she hasn't more often than she does not. Pain is in the left chest. Acute nature. Supposed radiation through her back. Sensation of lightheadedness at times. No palpitations, no diaphoresis. No shortness of breath. No cough. No fevers or chills. No unusual leg pain or swelling.  Past Medical History  Diagnosis Date  . Achalasia   . Vertigo   . History of stomach ulcers   . GERD (gastroesophageal reflux disease)   . HTN (hypertension)   . Anemia   . Asthma   . Thyroid mass   . MI (myocardial infarction)    Past Surgical History  Procedure Laterality Date  . Cesarean section    . Cervical discectomy      plates and screws in neck  . Exc benign breast lump    . Esophagogastroduodenoscopy  03/04/2011    Procedure: ESOPHAGOGASTRODUODENOSCOPY (EGD);  Surgeon: Louis Meckel, MD;  Location: [facility];  Service: Endoscopy;  Laterality: N/A;  BOTOX Injection  . Tonsillectomy    . Abdominal hysterectomy      done for menorrhagia, ovaries remain   Family History  Problem Relation Age of Onset  . Alcohol abuse Father   . Hypertension Father   . Heart disease Father    History  Substance Use Topics  . Smoking status: Never Smoker   . Smokeless tobacco: Never Used  . Alcohol Use: No   OB History    Gravida Para Term Preterm AB TAB SAB Ectopic Multiple Living   5 3 3             Review of Systems  All systems reviewed and negative, other than as noted in HPI.   Allergies  Aspirin; Hydrocodone-acetaminophen; Ibuprofen; Imdur; Meperidine hcl; Morphine; Oxycodone-acetaminophen; Procaine hcl; Toprol  xl; and Tramadol  Home Medications   Prior to Admission medications   Medication Sig Start Date End Date Taking? Authorizing Provider  albuterol (PROVENTIL HFA;VENTOLIN HFA) 108 (90 BASE) MCG/ACT inhaler Inhale 2 puffs into the lungs every 6 (six) hours as needed for wheezing or shortness of breath.    Historical Provider, MD  folic acid (FOLVITE) 1 MG tablet Take 1 mg by mouth 2 (two) times daily.    Historical Provider, MD  hydroxychloroquine (PLAQUENIL) 200 MG tablet Take 200 mg by mouth 2 (two) times daily.    Historical Provider, MD  levalbuterol [facility] HFA) 45 MCG/ACT inhaler Inhale 1-2 puffs into the lungs every 4 (four) hours as needed for wheezing or shortness of breath.     Historical Provider, MD  Methotrexate, PF, (OTREXUP) 20 MG/0.4ML SOAJ Inject into the skin every 7 (seven) days. Patient is unsure of how many units she takes on Friday s    Historical Provider, MD  omeprazole (PRILOSEC) 20 MG capsule Take 20 mg by mouth daily as needed (acid reflux).     Historical Provider, MD  predniSONE (DELTASONE) 5 MG tablet Take 5 mg by mouth 4 (four) times daily.  08/04/12   Historical Provider, MD   BP 105/70 mmHg  Pulse 77  Temp(Src) 98.2 F (36.8 C) (Oral)  Resp 19  SpO2 98% Physical Exam  Constitutional:  She appears well-developed and well-nourished. No distress.  HENT:  Head: Normocephalic and atraumatic.  Eyes: Conjunctivae are normal. Right eye exhibits no discharge. Left eye exhibits no discharge.  Neck: Neck supple.  Cardiovascular: Normal rate, regular rhythm and normal heart sounds.  Exam reveals no gallop and no friction rub.   No murmur heard. Pulmonary/Chest: Effort normal and breath sounds normal. No respiratory distress.  Abdominal: Soft. She exhibits no distension. There is no tenderness.  Musculoskeletal: She exhibits no edema or tenderness.  Lower extremities symmetric as compared to each other. No calf tenderness. Negative Homan's. No palpable cords.     Neurological: She is alert.  Skin: Skin is warm and dry.  Psychiatric: She has a normal mood and affect. Her behavior is normal. Thought content normal.  Nursing note and vitals reviewed.   ED Course  Procedures (including critical care time) Labs Review Labs Reviewed  BASIC METABOLIC PANEL - Abnormal; Notable for the following:    Potassium 3.3 (*)    Glucose, Bld 103 (*)    All other components within normal limits  CBC - Abnormal; Notable for the following:    WBC 3.6 (*)    All other components within normal limits  Rosezena Sensor, ED    Imaging Review Dg Chest 2 View  01/17/2014   CLINICAL DATA:  53 year old female with history of chest pain for the past 3 weeks.  EXAM: CHEST  2 VIEW  COMPARISON:  Chest x-ray 02/08/2013.  FINDINGS: Lung volumes are normal. No consolidative airspace disease. No pleural effusions. No pneumothorax. No pulmonary nodule or mass noted. Pulmonary vasculature and the cardiomediastinal silhouette are within normal limits. Orthopedic fixation hardware in the lower cervical spine.  IMPRESSION: No radiographic evidence of acute cardiopulmonary disease.   Electronically Signed   By: Trudie Reed M.D.   On: 01/17/2014 18:58     EKG Interpretation   Date/Time:  Wednesday January 17 2014 17:50:32 EST Ventricular Rate:  93 PR Interval:  152 QRS Duration: 92 QT Interval:  374 QTC Calculation: 465 R Axis:   22 Text Interpretation:  Normal sinus rhythm Minimal voltage criteria for  LVH, may be normal variant Cannot rule out Inferior infarct , age  undetermined Anterior infarct , age undetermined Abnormal ECG ED PHYSICIAN  INTERPRETATION AVAILABLE IN [facility] Confirmed by TEST, Record  ([id number]) on 01/19/2014 8:36:20 AM      MDM   Final diagnoses:  Chest pain        Raeford Razor, MD 01/23/14 [phone]

## 2014-01-17 NOTE — ED Notes (Addendum)
Pt reports having left side chest pains x 3 weeks, has become more severe and radiates through to her back. Pt feeling lightheaded and fatigued, denies sob or n/v. ekg done at triage and airway intact.

## 2014-01-18 ENCOUNTER — Ambulatory Visit: Payer: BC Managed Care – PPO | Admitting: Family Medicine

## 2014-01-18 ENCOUNTER — Encounter: Payer: Self-pay | Admitting: Gastroenterology

## 2014-01-24 ENCOUNTER — Telehealth: Payer: Self-pay | Admitting: Gastroenterology

## 2014-01-24 NOTE — Telephone Encounter (Signed)
Pt states she is having a lot of problems swallowing and feels that she cannot wait until February appt. Pt rescheduled to see Dr. Arlyce Dice tomorrow at 8:45am. Pt aware of appt.

## 2014-01-25 ENCOUNTER — Ambulatory Visit (INDEPENDENT_AMBULATORY_CARE_PROVIDER_SITE_OTHER): Payer: BC Managed Care – PPO | Admitting: Gastroenterology

## 2014-01-25 ENCOUNTER — Encounter: Payer: Self-pay | Admitting: Gastroenterology

## 2014-01-25 VITALS — BP 130/84 | HR 72 | Ht 65.5 in | Wt 229.1 lb

## 2014-01-25 DIAGNOSIS — Z1211 Encounter for screening for malignant neoplasm of colon: Secondary | ICD-10-CM | POA: Insufficient documentation

## 2014-01-25 DIAGNOSIS — R1314 Dysphagia, pharyngoesophageal phase: Secondary | ICD-10-CM

## 2014-01-25 MED ORDER — NA SULFATE-K SULFATE-MG SULF 17.5-3.13-1.6 GM/177ML PO SOLN: ORAL | Status: DC

## 2014-01-25 NOTE — Progress Notes (Signed)
_                                                                                                                History of Present Illness:  [name] is a 54 year old African American female who has returned because of complaints of dysphagia.  Dysphagia has been a problem since 2008.  She's undergone balloon dilation for questionable early distal esophageal stricture with temporary relief.  In 2009 she underwent esophageal manometry that demonstrated incomplete LES relaxation (80%) and 90% peristaltic waves throughout the body of the esophagus.  She has also had a couple of esophagrams  that did not demonstrate a frank stricture or distal esophageal narrowing suggestive of achalasia.  She has undergone Botox injection with relief lasting up to a year.  Last injection was 2013.  She's complaining again of dysphagia to solids and liquids.  She has occasional chest discomfort with swallowing as well.  Weight has been stable.   Past Medical History  Diagnosis Date  . Achalasia   . Vertigo   . History of stomach ulcers   . GERD (gastroesophageal reflux disease)   . HTN (hypertension)   . Anemia   . Asthma   . Thyroid mass   . MI (myocardial infarction)    Past Surgical History  Procedure Laterality Date  . Cesarean section    . Cervical discectomy      plates and screws in neck  . Exc benign breast lump    . Esophagogastroduodenoscopy  03/04/2011    Procedure: ESOPHAGOGASTRODUODENOSCOPY (EGD);  Surgeon: Louis Meckel, MD;  Location: [facility];  Service: Endoscopy;  Laterality: N/A;  BOTOX Injection  . Tonsillectomy    . Abdominal hysterectomy      done for menorrhagia, ovaries remain   family history includes Alcohol abuse in her father; Colon cancer in her maternal uncle; Diabetes in her maternal grandmother; Heart disease in her father; Hypertension in her father. There is no history of Colon polyps, Esophageal cancer, or Kidney disease. Current Outpatient  Prescriptions  Medication Sig Dispense Refill  . albuterol (PROVENTIL HFA;VENTOLIN HFA) 108 (90 BASE) MCG/ACT inhaler Inhale 2 puffs into the lungs every 6 (six) hours as needed for wheezing or shortness of breath.    . folic acid (FOLVITE) 1 MG tablet Take 1 mg by mouth 2 (two) times daily.    . hydroxychloroquine (PLAQUENIL) 200 MG tablet Take 200 mg by mouth 2 (two) times daily.    Marland Kitchen levalbuterol (XOPENEX HFA) 45 MCG/ACT inhaler Inhale 1-2 puffs into the lungs every 4 (four) hours as needed for wheezing or shortness of breath.     Marland Kitchen LORazepam (ATIVAN) 1 MG tablet Take 0.5 tablets (0.5 mg total) by mouth 3 (three) times daily as needed (muscle relaxation). 15 tablet 0  . Methotrexate, PF, (OTREXUP) 20 MG/0.4ML SOAJ Inject into the skin every 7 (seven) days. Patient is unsure of how many units she takes on Friday s. Patient unknown dose    . omeprazole (PRILOSEC) 20 MG  capsule Take 20 mg by mouth daily as needed (acid reflux).     . pantoprazole (PROTONIX) 20 MG tablet Take 1 tablet (20 mg total) by mouth daily. 30 tablet 0  . predniSONE (DELTASONE) 1 MG tablet Take 1 mg by mouth 2 (two) times daily.   5   No current facility-administered medications for this visit.   Allergies as of 01/25/2014 - Review Complete 01/25/2014  Allergen Reaction Noted  . Aspirin Other (See Comments)   . Hydrocodone-acetaminophen Hives and Nausea And Vomiting   . Ibuprofen Other (See Comments)   . Imdur [isosorbide] Other (See Comments) 02/04/2012  . Meperidine hcl Hives and Nausea And Vomiting   . Morphine Hives and Nausea And Vomiting   . Oxycodone-acetaminophen Hives and Nausea And Vomiting   . Procaine hcl Other (See Comments)   . Toprol xl [metoprolol] Other (See Comments) 02/04/2012  . Tramadol Itching 08/03/2012    reports that she has never smoked. She has never used smokeless tobacco. She reports that she does not drink alcohol or use illicit drugs.   Review of Systems: Pertinent positive and  negative review of systems were noted in the above HPI section. All other review of systems were otherwise negative.  Vital signs were reviewed in today's medical record Physical Exam: General: Well developed , well nourished, no acute distress Skin: anicteric Head: Normocephalic and atraumatic Eyes:  sclerae anicteric, EOMI Ears: Normal auditory acuity Mouth: No deformity or lesions Neck: Supple, no masses or thyromegaly Lungs: Clear throughout to auscultation Heart: Regular rate and rhythm; no murmurs, rubs or bruits Abdomen: Soft, non tender and non distended. No masses, hepatosplenomegaly or hernias noted. Normal Bowel sounds Rectal:deferred Musculoskeletal: Symmetrical with no gross deformities  Skin: No lesions on visible extremities Pulses:  Normal pulses noted Extremities: No clubbing, cyanosis, edema or deformities noted Neurological: Alert oriented x 4, grossly nonfocal Cervical Nodes:  No significant cervical adenopathy Inguinal Nodes: No significant inguinal adenopathy Psychological:  Alert and cooperative. Normal mood and affect  See Assessment and Plan under Problem List

## 2014-01-25 NOTE — Assessment & Plan Note (Signed)
Patient has dysphagia to solids and liquids, history of nondiagnostic esophageal manometry and good responses to Botox injection.  I suspect that she may have achalasia but this has yet be proven.  At this juncture I think it is prudent to repeat esophageal manometry to help delineate whether she, in fact, has a motility disorder such as achalasia.

## 2014-01-25 NOTE — Patient Instructions (Signed)
You have been scheduled for a colonoscopy. Please follow written instructions given to you at your visit today.  Please pick up your prep kit at the pharmacy within the next 1-3 days. If you use inhalers (even only as needed), please bring them with you on the day of your procedure. Your physician has requested that you go to www.startemmi.com and enter the access code given to you at your visit today(SENT TO YOUR E-MAIL). This web site gives a general overview about your procedure. However, you should still follow specific instructions given to you by our office regarding your preparation for the procedure.  _____________________________________________________________________________________________________________________________________________________________________________________________  You have been scheduled for an esophageal manometry at [facility] on 02-12-2014 at 10:30 am. Please arrive 30 minutes prior to your procedure for registration. You will need to go to outpatient registration (1st floor of the hospital) first. Make certain to bring your insurance cards as well as a complete list of medications.  Please remember the following:  1) Nothing to eat or drink after 12:00 midnight on the night before your test.  2) Hold all diabetic medications/insulin the morning of the test. You may eat and take your medications after the test.  3) For 3 days prior to your test do not take: Dexilant, Prevacid, Nexium, Protonix, Aciphex, Zegerid, Pantoprazole, Prilosec or omeprazole.  4) For 2 days prior to your test, do not take: Reglan, Tagamet, Zantac, Axid or Pepcid.  5) You MAY use an antacid such as Rolaids or Tums up to 12 hours prior to your test.  It will take at least 2 weeks to receive the results of this test from your physician. ------------------------------------------ ABOUT ESOPHAGEAL MANOMETRY Esophageal manometry (muh-NOM-uh-tree) is a test that gauges how well  your esophagus works. Your esophagus is the long, muscular tube that connects your throat to your stomach. Esophageal manometry measures the rhythmic muscle contractions (peristalsis) that occur in your esophagus when you swallow. Esophageal manometry also measures the coordination and force exerted by the muscles of your esophagus.  During esophageal manometry, a thin, flexible tube (catheter) that contains sensors is passed through your nose, down your esophagus and into your stomach. Esophageal manometry can be helpful in diagnosing some mostly uncommon disorders that affect your esophagus.  Why it's done Esophageal manometry is used to evaluate the movement (motility) of food through the esophagus and into the stomach. The test measures how well the circular bands of muscle (sphincters) at the top and bottom of your esophagus open and close, as well as the pressure, strength and pattern of the wave of esophageal muscle contractions that moves food along.  What you can expect Esophageal manometry is an outpatient procedure done without sedation. Most people tolerate it well. You may be asked to change into a hospital gown before the test starts.  During esophageal manometry  While you are sitting up, a member of your health care team sprays your throat with a numbing medication or puts numbing gel in your nose or both.  A catheter is guided through your nose into your esophagus. The catheter may be sheathed in a water-filled sleeve. It doesn't interfere with your breathing. However, your eyes may water, and you may gag. You may have a slight nosebleed from irritation.  After the catheter is in place, you may be asked to lie on your back on an exam table, or you may be asked to remain seated.  You then swallow small sips of water. As you do, a computer connected  to the catheter records the pressure, strength and pattern of your esophageal muscle contractions.  During the test, you'll be asked to breathe  slowly and smoothly, remain as still as possible, and swallow only when you're asked to do so.  A member of your health care team may move the catheter down into your stomach while the catheter continues its measurements.  The catheter then is slowly withdrawn. The test usually lasts 20 to 30 minutes.  After esophageal manometry  When your esophageal manometry is complete, you may return to your normal activities  This test typically takes 30-45 minutes to complete. ________________________________________________________________________________

## 2014-01-25 NOTE — Assessment & Plan Note (Signed)
She is overdue for screening colonoscopy.

## 2014-01-29 ENCOUNTER — Telehealth: Payer: Self-pay | Admitting: Gastroenterology

## 2014-01-29 NOTE — Telephone Encounter (Signed)
Patient wanted to know exactly what the procedure of esophageal manometry entailed. She is unsure if she wants to do it. Very brief explanation given. Encouraged to ask lots of questions at the hospital.

## 2014-02-12 ENCOUNTER — Encounter (HOSPITAL_COMMUNITY)
Admission: RE | Disposition: A | Discharge status: Home / Self Care | Payer: Self-pay | Source: Ambulatory Visit | Attending: Gastroenterology

## 2014-02-12 ENCOUNTER — Ambulatory Visit (HOSPITAL_COMMUNITY)
Admission: RE | Admit: 2014-02-12 | Discharge: 2014-02-12 | Disposition: A | Discharge status: Home / Self Care | Payer: BC Managed Care – PPO | Source: Ambulatory Visit | Attending: Gastroenterology | Admitting: Gastroenterology

## 2014-02-12 DIAGNOSIS — R1310 Dysphagia, unspecified: Secondary | ICD-10-CM | POA: Diagnosis not present

## 2014-02-12 HISTORY — PX: ESOPHAGEAL MANOMETRY: SHX5429

## 2014-02-12 SURGERY — MANOMETRY, ESOPHAGUS

## 2014-02-12 MED ORDER — LIDOCAINE VISCOUS 2 % MT SOLN
OROMUCOSAL | Status: AC
  Filled 2014-02-12: qty 15

## 2014-02-12 SURGICAL SUPPLY — 2 items
FACESHIELD LNG OPTICON STERILE (SAFETY) IMPLANT
GLOVE BIO SURGEON STRL SZ8 (GLOVE) ×4 IMPLANT

## 2014-02-13 ENCOUNTER — Encounter (HOSPITAL_COMMUNITY): Payer: Self-pay | Admitting: Gastroenterology

## 2014-02-16 ENCOUNTER — Encounter: Payer: Self-pay | Admitting: Gastroenterology

## 2014-02-16 ENCOUNTER — Ambulatory Visit (AMBULATORY_SURGERY_CENTER): Payer: BC Managed Care – PPO | Admitting: Gastroenterology

## 2014-02-16 VITALS — BP 121/71 | HR 66 | Temp 97.0°F | Resp 18 | Ht 65.5 in | Wt 229.0 lb

## 2014-02-16 DIAGNOSIS — Z1211 Encounter for screening for malignant neoplasm of colon: Secondary | ICD-10-CM

## 2014-02-16 DIAGNOSIS — K5730 Diverticulosis of large intestine without perforation or abscess without bleeding: Secondary | ICD-10-CM

## 2014-02-16 MED ORDER — SODIUM CHLORIDE 0.9 % IV SOLN: 500.0000 mL | INTRAVENOUS | Status: DC

## 2014-02-16 NOTE — Patient Instructions (Signed)
Impressions/recommendations:  Diverticulosis (handout given) High Fiber diet (handout given)  YOU HAD AN ENDOSCOPIC PROCEDURE TODAY AT THE [facility]: Refer to the procedure report that was given to you for any specific questions about what was found during the examination.  If the procedure report does not answer your questions, please call your gastroenterologist to clarify.  If you requested that your care partner not be given the details of your procedure findings, then the procedure report has been included in a sealed envelope for you to review at your convenience later.  YOU SHOULD EXPECT: Some feelings of bloating in the abdomen. Passage of more gas than usual.  Walking can help get rid of the air that was put into your GI tract during the procedure and reduce the bloating. If you had a lower endoscopy (such as a colonoscopy or flexible sigmoidoscopy) you may notice spotting of blood in your stool or on the toilet paper. If you underwent a bowel prep for your procedure, then you may not have a normal bowel movement for a few days.  DIET: Your first meal following the procedure should be a light meal and then it is ok to progress to your normal diet.  A half-sandwich or bowl of soup is an example of a good first meal.  Heavy or fried foods are harder to digest and may make you feel nauseous or bloated.  Likewise meals heavy in dairy and vegetables can cause extra gas to form and this can also increase the bloating.  Drink plenty of fluids but you should avoid alcoholic beverages for 24 hours.  ACTIVITY: Your care partner should take you home directly after the procedure.  You should plan to take it easy, moving slowly for the rest of the day.  You can resume normal activity the day after the procedure however you should NOT DRIVE or use heavy machinery for 24 hours (because of the sedation medicines used during the test).    SYMPTOMS TO REPORT IMMEDIATELY: A gastroenterologist can  be reached at any hour.  During normal business hours, 8:30 AM to 5:00 PM Monday through Friday, call [phone].  After hours and on weekends, please call the GI answering service at [phone] who will take a message and have the physician on call contact you.   Following lower endoscopy (colonoscopy or flexible sigmoidoscopy):  Excessive amounts of blood in the stool  Significant tenderness or worsening of abdominal pains  Swelling of the abdomen that is new, acute  Fever of 100F or higher   FOLLOW UP: If any biopsies were taken you will be contacted by phone or by letter within the next 1-3 weeks.  Call your gastroenterologist if you have not heard about the biopsies in 3 weeks.  Our staff will call the home number listed on your records the next business day following your procedure to check on you and address any questions or concerns that you may have at that time regarding the information given to you following your procedure. This is a courtesy call and so if there is no answer at the home number and we have not heard from you through the emergency physician on call, we will assume that you have returned to your regular daily activities without incident.  SIGNATURES/CONFIDENTIALITY: You and/or your care partner have signed paperwork which will be entered into your electronic medical record.  These signatures attest to the fact that that the information above on your After Visit Summary has been   reviewed and is understood.  Full responsibility of the confidentiality of this discharge information lies with you and/or your care-partner. 

## 2014-02-16 NOTE — Progress Notes (Signed)
Report to PACU, RN, vss, BBS= Clear.  

## 2014-02-16 NOTE — Op Note (Addendum)
[facility] [address] [place], [address]   COLONOSCOPY PROCEDURE REPORT  [name], Patricia Reed  MR#: [id number] BIRTHDATE: 07-May-1960 , 53  yrs. old GENDER: female ENDOSCOPIST: Louis Meckel, MD REFERRED BY:Amy Michelle Nasuti, M.D. PROCEDURE DATE:  02/16/2014 PROCEDURE:   Colonoscopy, diagnostic First Screening Colonoscopy - Avg.  risk and is 50 yrs.  old or older Yes.  Prior Negative Screening - Now for repeat screening. N/A  History of Adenoma - Now for follow-up colonoscopy & has been > or = to 3 yrs.  N/A  Polyps Removed Today? No.  Recommend repeat exam, <10 yrs? No. ASA CLASS:   Class II INDICATIONS:first colonoscopy and average risk for colon cancer. MEDICATIONS: Monitored anesthesia care and Propofol 230 mg IV  DESCRIPTION OF PROCEDURE:   After the risks benefits and alternatives of the procedure were thoroughly explained, informed consent was obtained.  The digital rectal exam revealed no abnormalities of the rectum.   The LB [id number]  endoscope was introduced through the anus and advanced to the cecum, which was identified by both the appendix and ileocecal valve. No adverse events experienced.   The quality of the prep was excellent using Suprep  The instrument was then slowly withdrawn as the colon was fully examined.    COLON FINDINGS: There was mild diverticulosis noted.   The examination was otherwise normal.   Internal Grade III hemorrhoids were found.  Retroflexed views revealed no abnormalities. The time to cecum=2 minutes 53 seconds.  Withdrawal time=6 minutes 06 seconds.  The scope was withdrawn and the procedure completed. COMPLICATIONS: There were no immediate complications.  ENDOSCOPIC IMPRESSION: 1.   Mild diverticulosis was noted 2.  Internal hemorrhoids 3.   The examination was otherwise normal  RECOMMENDATIONS: Continue current colorectal screening recommendations for "routine risk" patients with  repeat  colonoscopy in 10 years. to consider band ligation of internal hemorrhoids  eSigned:  Louis Meckel, MD 02/16/2014 8:53 AM Revised: 02/16/2014 8:53 AM  cc:

## 2014-02-19 ENCOUNTER — Telehealth: Payer: Self-pay | Admitting: *Deleted

## 2014-02-19 NOTE — Telephone Encounter (Signed)
  Follow up Call-  Call back number 02/16/2014  Post procedure Call Back phone  # [phone]  Permission to leave phone message Yes    [facility]

## 2014-03-06 ENCOUNTER — Ambulatory Visit: Payer: BC Managed Care – PPO | Admitting: Gastroenterology

## 2014-03-15 ENCOUNTER — Ambulatory Visit (AMBULATORY_SURGERY_CENTER): Payer: Self-pay | Admitting: *Deleted

## 2014-03-15 VITALS — Ht 65.0 in | Wt 226.8 lb

## 2014-03-15 DIAGNOSIS — R1310 Dysphagia, unspecified: Secondary | ICD-10-CM

## 2014-03-15 NOTE — Progress Notes (Signed)
Pt has multiple allergies No egg or soy allergy No diet pills  no home 02 use No emmi video

## 2014-03-20 ENCOUNTER — Telehealth: Payer: Self-pay | Admitting: Gastroenterology

## 2014-03-27 ENCOUNTER — Ambulatory Visit (AMBULATORY_SURGERY_CENTER): Payer: BC Managed Care – PPO | Admitting: Gastroenterology

## 2014-03-27 ENCOUNTER — Encounter: Payer: Self-pay | Admitting: Gastroenterology

## 2014-03-27 VITALS — BP 134/93 | HR 65 | Temp 96.5°F | Resp 18 | Ht 65.5 in | Wt 229.0 lb

## 2014-03-27 DIAGNOSIS — R1310 Dysphagia, unspecified: Secondary | ICD-10-CM

## 2014-03-27 MED ORDER — SODIUM CHLORIDE 0.9 % IV SOLN: 500.0000 mL | INTRAVENOUS | Status: DC

## 2014-03-27 NOTE — Progress Notes (Signed)
Called to room to assist during endoscopic procedure.  Patient ID and intended procedure confirmed with present staff. Received instructions for my participation in the procedure from the performing physician.  

## 2014-03-27 NOTE — Progress Notes (Signed)
Report to PACU, RN, vss, BBS= Clear.  

## 2014-03-27 NOTE — Patient Instructions (Signed)
YOU HAD AN ENDOSCOPIC PROCEDURE TODAY AT THE [facility]:   Refer to the procedure report that was given to you for any specific questions about what was found during the examination.  If the procedure report does not answer your questions, please call your gastroenterologist to clarify.  If you requested that your care partner not be given the details of your procedure findings, then the procedure report has been included in a sealed envelope for you to review at your convenience later.  YOU SHOULD EXPECT: Some feelings of bloating in the abdomen. Passage of more gas than usual.  Walking can help get rid of the air that was put into your GI tract during the procedure and reduce the bloating. If you had a lower endoscopy (such as a colonoscopy or flexible sigmoidoscopy) you may notice spotting of blood in your stool or on the toilet paper. If you underwent a bowel prep for your procedure, you may not have a normal bowel movement for a few days.  Please Note:  You might notice some irritation and congestion in your nose or some drainage.  This is from the oxygen used during your procedure.  There is no need for concern and it should clear up in a day or so.  SYMPTOMS TO REPORT IMMEDIATELY:   Following lower endoscopy (colonoscopy or flexible sigmoidoscopy):  Excessive amounts of blood in the stool  Significant tenderness or worsening of abdominal pains  Swelling of the abdomen that is new, acute  Fever of 100F or higher  For urgent or emergent issues, a gastroenterologist can be reached at any hour by calling [phone].  DIET: follow dilation diet today- see handout.  Drink plenty of fluids but you should avoid alcoholic beverages for 24 hours.  ACTIVITY:  You should plan to take it easy for the rest of today and you should NOT DRIVE or use heavy machinery until tomorrow (because of the sedation medicines used during the test).    FOLLOW UP: Our staff will call the number  listed on your records the next business day following your procedure to check on you and address any questions or concerns that you may have regarding the information given to you following your procedure. If we do not reach you, we will leave a message.  However, if you are feeling well and you are not experiencing any problems, there is no need to return our call.  We will assume that you have returned to your regular daily activities without incident.  SIGNATURES/CONFIDENTIALITY: You and/or your care partner have signed paperwork which will be entered into your electronic medical record.  These signatures attest to the fact that that the information above on your After Visit Summary has been reviewed and is understood.  Full responsibility of the confidentiality of this discharge information lies with you and/or your care-partner.  Please call the office in the next few days to set up a follow up appointment with Dr. Arlyce Dice for 6 weeks  Continue your normal medications

## 2014-03-27 NOTE — Op Note (Signed)
[facility] [address] [place], [address]   ENDOSCOPY WITH DILATION PROCEDURE REPORT  PATIENT: Patricia, Reed  MR#: [id number] BIRTHDATE: May 02, 1960 , 53  yrs. old GENDER: female ENDOSCOPIST: Louis Meckel, MD ASSISTANT: REFERRED BY: PROCEDURE DATE:  03/27/2014 PROCEDURE:   EGD, diagnostic and EGD w/ balloon dilation ASA CLASS:   Class II INDICATIONS: dysphagia. MEDICATIONS: Monitored anesthesia care, Propofol 200 mg IV, and Lidocaine 200 mg IV TOPICAL ANESTHETIC:  DESCRIPTION OF PROCEDURE:   After the risks benefits and alternatives of the procedure were thoroughly explained, informed consent was obtained.  The LB [id number]  endoscope was introduced through the mouth  and advanced to the third portion of the duodenum , limited by Without limitations.   The instrument was slowly withdrawn as the mucosa was carefully examined. Estimated blood loss is zero unless otherwise noted in this procedure report.      EXAM: The esophagus and gastroesophageal junction were completely normal in appearance.  The stomach was entered and closely examined.The antrum, angularis, and lesser curvature were well visualized, including a retroflexed view of the cardia and fundus. The stomach wall was normally distensable.  The scope passed easily through the pylorus into the duodenum.  Dilation was then performed at the gastroesophageal junction  Dilator:Balloon  Reststance:none Heme:none  COMPLICATIONS: There were no immediate complications.  ENDOSCOPIC IMPRESSION: Normal appearing esophagus and GE junction, the stomach was well visualized and normal in appearance, normal appearing duodenum -  RECOMMENDATIONS: Office visit 6 weeks  eSigned:  Louis Meckel, MD 03/27/2014 7:54 AM  CC: Kerby Nora, MD  CPT CODES: ICD CODES:  The ICD and CPT codes recommended by this software are interpretations from the data that the clinical staff has  captured with the software.  The verification of the translation of this report to the ICD and CPT codes and modifiers is the sole responsibility of the health care institution and practicing physician where this report was generated.  [employer] will not be held responsible for the validity of the ICD and CPT codes included on this report.  AMA assumes no liability for data contained or not contained herein. CPT is a Publishing rights manager of the [employer].

## 2014-03-28 ENCOUNTER — Telehealth: Payer: Self-pay | Admitting: *Deleted

## 2014-03-28 NOTE — Telephone Encounter (Signed)
  Follow up Call-  Call back number 03/27/2014 02/16/2014  Post procedure Call Back phone  # [phone]  Permission to leave phone message Yes Yes     Patient questions:  Do you have a fever, pain , or abdominal swelling? No. Pain Score  0 *  Have you tolerated food without any problems? Yes.    Have you been able to return to your normal activities? Yes.    Do you have any questions about your discharge instructions: Diet   No. Medications  No. Follow up visit  No.  Do you have questions or concerns about your Care? No.  Actions: * If pain score is 4 or above: No action needed, pain <4.

## 2014-04-04 ENCOUNTER — Encounter: Payer: BC Managed Care – PPO | Admitting: Gastroenterology

## 2014-04-24 ENCOUNTER — Encounter: Payer: Self-pay | Admitting: Internal Medicine

## 2014-04-24 ENCOUNTER — Ambulatory Visit (INDEPENDENT_AMBULATORY_CARE_PROVIDER_SITE_OTHER): Payer: BC Managed Care – PPO | Admitting: Internal Medicine

## 2014-04-24 ENCOUNTER — Telehealth: Payer: Self-pay | Admitting: Family Medicine

## 2014-04-24 VITALS — BP 120/86 | HR 77 | Temp 98.4°F | Wt 225.0 lb

## 2014-04-24 DIAGNOSIS — J309 Allergic rhinitis, unspecified: Secondary | ICD-10-CM | POA: Diagnosis not present

## 2014-04-24 NOTE — Telephone Encounter (Signed)
[facility] - Client TELEPHONE ADVICE RECORD [facility] Patient Name: Patricia Reed DOB: 1961-01-07 Initial Comment Caller states she woke up fine today. Now at work very dizzy. BP 129/85, normal. Nurse Assessment Nurse: Chrys Racer, RN, Alexia Freestone Date/Time Lamount Cohen Time): 04/24/2014 10:56:23 AM Confirm and document reason for call. If symptomatic, describe symptoms. ---Caller states she woke up fine today. Now at work very dizzy. BP 129/85, normal. Ate breakfast. Has runny nose, sore throat, ears also "sore". Afebrile Has the patient traveled out of the country within the last 30 days? ---No Does the patient require triage? ---Yes Related visit to physician within the last 2 weeks? ---No Does the PT have any chronic conditions? (i.e. diabetes, asthma, etc.) ---Yes List chronic conditions. ---asthma, RA, Did the patient indicate they were pregnant? ---No Guidelines Guideline Title Affirmed Question Affirmed Notes Dizziness - Vertigo Earache Final Disposition User See Physician within 24 Hours [place], RN, Alexia Freestone Comments instructed by husband not to call secondary number TC to pt to confirm appt today at 4:15 w [facility]

## 2014-04-24 NOTE — Progress Notes (Signed)
HPI  Pt presents to the clinic today with c/o ear fullness, dizziness and sore throat. This started this morning. She feels off balanced and a little loopy. The room does not feel like it is spinning. She is coughing up yellow mucous. She denies fever, facial pain or pressure. She denies chest pain, chest tightness or shortness of breath. She has tried Theraflu OTC. She does have a history of seasonal allergies and Vertigo. She has not had sick contacts.  Review of Systems    Past Medical History  Diagnosis Date  . Vertigo   . History of stomach ulcers   . GERD (gastroesophageal reflux disease)   . HTN (hypertension)   . Anemia   . Asthma   . Thyroid mass   . MI (myocardial infarction)     2013    Family History  Problem Relation Age of Onset  . Alcohol abuse Father   . Hypertension Father   . Heart disease Father   . Colon cancer Maternal Uncle   . Colon polyps Neg Hx   . Esophageal cancer Neg Hx   . Kidney disease Neg Hx   . Stomach cancer Neg Hx   . Rectal cancer Neg Hx   . Diabetes Maternal Grandmother     History   Social History  . Marital Status: Married    Spouse Name: N/A  . Number of Children: 4  . 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 Smoking status: Never Smoker   . Smokeless tobacco: Never Used  . Alcohol Use: No  . Drug Use: No  . Sexual Activity: Yes    Birth Control/ Protection: None, Surgical     Comment: LAVH   Other Topics Concern  . Not on file   Social History Narrative    Allergies  Allergen Reactions  . Aspirin Other (See Comments)    GI bleed  . Hydrocodone-Acetaminophen Hives and Nausea And Vomiting  . Ibuprofen Other (See Comments)    GI bleed  . Imdur [Isosorbide] Other (See Comments)    Reaction unknown  . Meperidine Hcl Hives and Nausea And Vomiting    Short term memory loss  . Morphine Hives and Nausea And Vomiting  .  Oxycodone-Acetaminophen Hives and Nausea And Vomiting    Reaction unknown  . Procaine Hcl Other (See Comments)    Ineffective  . Toprol Xl [Metoprolol] Other (See Comments)    Reaction unknown  . Tramadol Itching     Constitutional:  Denies headache, fatigue, fever or abrupt weight changes.  HEENT:  Positive ear fullness, nasal congestion and sore throat. Denies eye redness, ear pain, ringing in the ears, wax buildup, runny nose or bloody nose. Respiratory: Positive cough. Denies difficulty breathing or shortness of breath.  Cardiovascular: Denies chest pain, chest tightness, palpitations or swelling in the hands or feet.   No other specific complaints in a complete review of systems (except as listed in HPI above).  Objective:  BP 120/86 mmHg  Pulse 77  Temp(Src) 98.4 F (36.9 C) (Oral)  Wt 225 lb (102.059 kg)  SpO2 99%   General: Appears her stated age, well developed, well nourished in NAD. HEENT: Head: normal shape and size, no sinus tenderness noted; Eyes: sclera white, no icterus, conjunctiva pink, PERRLA and EOMs intact; Ears: Tm's gray and intact, normal light reflex; Nose: mucosa pink and moist, septum midline; Throat/Mouth: + PND. Teeth present, mucosa  pink and moist, no exudate noted, no lesions or ulcerations noted.  Neck: No adenopathy noted.  Cardiovascular: Normal rate and rhythm. S1,S2 noted.  No murmur, rubs or gallops noted.  Pulmonary/Chest: Normal effort and positive vesicular breath sounds. No respiratory distress. No wheezes, rales or ronchi noted.      Assessment & Plan:   Allergic Rhinitis  Can use a Neti Pot which can be purchased from your local drug store. Flonase 2 sprays each nostril for 3 days and then as needed. May try Zyrtec OTC  RTC as needed or if symptoms persist.

## 2014-04-24 NOTE — Progress Notes (Signed)
Pre visit review using our clinic review tool, if applicable. No additional management support is needed unless otherwise documented below in the visit note. 

## 2014-04-24 NOTE — Patient Instructions (Signed)

## 2014-04-26 ENCOUNTER — Emergency Department (HOSPITAL_COMMUNITY)
Admission: EM | Admit: 2014-04-26 | Discharge: 2014-04-26 | Disposition: A | Discharge status: Home / Self Care | Payer: BC Managed Care – PPO | Attending: Emergency Medicine | Admitting: Emergency Medicine

## 2014-04-26 ENCOUNTER — Encounter (HOSPITAL_COMMUNITY): Payer: Self-pay

## 2014-04-26 ENCOUNTER — Emergency Department (HOSPITAL_COMMUNITY)
Admission: EM | Admit: 2014-04-26 | Discharge: 2014-04-26 | Disposition: A | Discharge status: Home / Self Care | Payer: BC Managed Care – PPO | Source: Home / Self Care | Attending: Family Medicine | Admitting: Family Medicine

## 2014-04-26 ENCOUNTER — Encounter (HOSPITAL_COMMUNITY): Payer: Self-pay | Admitting: Family Medicine

## 2014-04-26 DIAGNOSIS — D649 Anemia, unspecified: Secondary | ICD-10-CM | POA: Diagnosis not present

## 2014-04-26 DIAGNOSIS — Z79899 Other long term (current) drug therapy: Secondary | ICD-10-CM | POA: Diagnosis not present

## 2014-04-26 DIAGNOSIS — I10 Essential (primary) hypertension: Secondary | ICD-10-CM | POA: Diagnosis not present

## 2014-04-26 DIAGNOSIS — L0291 Cutaneous abscess, unspecified: Secondary | ICD-10-CM

## 2014-04-26 DIAGNOSIS — J45909 Unspecified asthma, uncomplicated: Secondary | ICD-10-CM | POA: Insufficient documentation

## 2014-04-26 DIAGNOSIS — R6883 Chills (without fever): Secondary | ICD-10-CM | POA: Diagnosis not present

## 2014-04-26 DIAGNOSIS — Z9889 Other specified postprocedural states: Secondary | ICD-10-CM | POA: Diagnosis not present

## 2014-04-26 DIAGNOSIS — Z8639 Personal history of other endocrine, nutritional and metabolic disease: Secondary | ICD-10-CM | POA: Diagnosis not present

## 2014-04-26 DIAGNOSIS — K219 Gastro-esophageal reflux disease without esophagitis: Secondary | ICD-10-CM | POA: Insufficient documentation

## 2014-04-26 DIAGNOSIS — M436 Torticollis: Secondary | ICD-10-CM | POA: Diagnosis not present

## 2014-04-26 DIAGNOSIS — M542 Cervicalgia: Secondary | ICD-10-CM | POA: Diagnosis present

## 2014-04-26 DIAGNOSIS — I889 Nonspecific lymphadenitis, unspecified: Secondary | ICD-10-CM

## 2014-04-26 DIAGNOSIS — Z8739 Personal history of other diseases of the musculoskeletal system and connective tissue: Secondary | ICD-10-CM | POA: Diagnosis not present

## 2014-04-26 DIAGNOSIS — L040 Acute lymphadenitis of face, head and neck: Secondary | ICD-10-CM | POA: Diagnosis not present

## 2014-04-26 DIAGNOSIS — I252 Old myocardial infarction: Secondary | ICD-10-CM | POA: Diagnosis not present

## 2014-04-26 HISTORY — DX: Rheumatoid arthritis, unspecified: M06.9

## 2014-04-26 MED ORDER — ONDANSETRON 4 MG PO TBDP
4.0000 mg | ORAL_TABLET | Freq: Once | ORAL | Status: AC
  Administered 2014-04-26: 4 mg via ORAL
  Filled 2014-04-26: qty 1

## 2014-04-26 MED ORDER — CLINDAMYCIN HCL 300 MG PO CAPS
300.0000 mg | ORAL_CAPSULE | Freq: Once | ORAL | Status: AC
  Administered 2014-04-26: 300 mg via ORAL
  Filled 2014-04-26: qty 1

## 2014-04-26 MED ORDER — DIPHENHYDRAMINE HCL 25 MG PO CAPS
25.0000 mg | ORAL_CAPSULE | Freq: Once | ORAL | Status: AC
  Administered 2014-04-26: 25 mg via ORAL
  Filled 2014-04-26: qty 1

## 2014-04-26 MED ORDER — SODIUM CHLORIDE 0.9 % IV SOLN
Freq: Once | INTRAVENOUS | Status: AC
  Administered 2014-04-26: 10:00:00 via INTRAVENOUS

## 2014-04-26 MED ORDER — ONDANSETRON HCL 4 MG PO TABS: 4.0000 mg | ORAL_TABLET | Freq: Three times a day (TID) | ORAL | Status: DC | PRN

## 2014-04-26 MED ORDER — CLINDAMYCIN HCL 150 MG PO CAPS: 300.0000 mg | ORAL_CAPSULE | Freq: Four times a day (QID) | ORAL | Status: DC

## 2014-04-26 MED ORDER — OXYCODONE-ACETAMINOPHEN 5-325 MG PO TABS: 1.0000 | ORAL_TABLET | ORAL | Status: DC | PRN

## 2014-04-26 MED ORDER — OXYCODONE-ACETAMINOPHEN 5-325 MG PO TABS
1.0000 | ORAL_TABLET | Freq: Once | ORAL | Status: AC
  Administered 2014-04-26: 1 via ORAL
  Filled 2014-04-26: qty 1

## 2014-04-26 NOTE — Telephone Encounter (Signed)
A user error has taken place.

## 2014-04-26 NOTE — ED Notes (Signed)
Pt already in gown; pt placed on monitor, continuous pulse oximetry and blood pressure cuff

## 2014-04-26 NOTE — ED Notes (Signed)
PA Student at the bedside.  

## 2014-04-26 NOTE — ED Provider Notes (Signed)
CSN: [id number]     Arrival date & time 04/26/14  0825 History   First MD Initiated Contact with Patient 04/26/14 [phone]     Chief Complaint  Patient presents with  . Cyst   (Consider location/radiation/quality/duration/timing/severity/associated sxs/prior Treatment) HPI  3 weeks ago developed a neck bump. Left-sided posterior neck. Progressively getting larger. Denies discharge. Pain has become acutely worse in bump has become significantly larger the last couple of days. Now developed chills, general malaise, neck stiffness, severe headache. Very little oral intake over the last day. Tylenol without improvement in headache. Patient is on Actonel and methotrexate. Unable to move neck to the left. Denies nausea, vomiting, chest pain, shortness of breath, palpitations, abdominal pain, dysuria, frequency, back pain.  Past Medical History  Diagnosis Date  . Vertigo   . History of stomach ulcers   . GERD (gastroesophageal reflux disease)   . HTN (hypertension)   . Anemia   . Asthma   . Thyroid mass   . MI (myocardial infarction)     2013  . Rheumatoid arthritis    Past Surgical History  Procedure Laterality Date  . Cesarean section    . Cervical discectomy      plates and screws in neck  . Exc benign breast lump    . Esophagogastroduodenoscopy  03/04/2011    Procedure: ESOPHAGOGASTRODUODENOSCOPY (EGD);  Surgeon: Louis Meckel, MD;  Location: [facility];  Service: Endoscopy;  Laterality: N/A;  BOTOX Injection  . Tonsillectomy    . Abdominal hysterectomy      done for menorrhagia, ovaries remain  . Esophageal manometry N/A 02/12/2014    Procedure: ESOPHAGEAL MANOMETRY (EM);  Surgeon: Louis Meckel, MD;  Location: [facility];  Service: Endoscopy;  Laterality: N/A;  . Upper gastrointestinal endoscopy    . Colonoscopy     Family History  Problem Relation Age of Onset  . Alcohol abuse Father   . Hypertension Father   . Heart disease Father   . Colon cancer Maternal Uncle   .  Colon polyps Neg Hx   . Esophageal cancer Neg Hx   . Kidney disease Neg Hx   . Stomach cancer Neg Hx   . Rectal cancer Neg Hx   . Diabetes Maternal Grandmother    History  Substance Use Topics  . Smoking status: Never Smoker   . Smokeless tobacco: Never Used  . Alcohol Use: No   OB History    Gravida Para Term Preterm AB TAB SAB Ectopic Multiple Living   5 3 3             Review of Systems Per HPI with all other pertinent systems negative.   Allergies  Aspirin; Hydrocodone-acetaminophen; Ibuprofen; Imdur; Meperidine hcl; Morphine; Oxycodone-acetaminophen; Procaine hcl; Toprol xl; and Tramadol  Home Medications   Prior to Admission medications   Medication Sig Start Date End Date Taking? Authorizing Provider  albuterol (PROVENTIL HFA;VENTOLIN HFA) 108 (90 BASE) MCG/ACT inhaler Inhale 2 puffs into the lungs every 6 (six) hours as needed for wheezing or shortness of breath.    Historical Provider, MD  folic acid (FOLVITE) 1 MG tablet Take 1 mg by mouth 2 (two) times daily.    Historical Provider, MD  hydroxychloroquine (PLAQUENIL) 200 MG tablet Take 200 mg by mouth 2 (two) times daily.    Historical Provider, MD  levalbuterol [facility] HFA) 45 MCG/ACT inhaler Inhale 1-2 puffs into the lungs every 4 (four) hours as needed for wheezing or shortness of breath.  Historical Provider, MD  Methotrexate, PF, (OTREXUP) 20 MG/0.4ML SOAJ Inject into the skin every 7 (seven) days. Patient is unsure of how many units she takes on Friday s. Patient unknown dose    Historical Provider, MD  omeprazole (PRILOSEC) 20 MG capsule Take 20 mg by mouth daily as needed (acid reflux).     Historical Provider, MD   BP 165/103 mmHg  Pulse 68  Temp(Src) 98.1 F (36.7 C) (Oral)  Resp 18  SpO2 100% Physical Exam Physical Exam  Constitutional: Patient writhing in pain in the room. oriented to person, place, and time. appears well-developed and well-nourished. No distress.  HENT:  Head: Normocephalic and  atraumatic.  Eyes: EOMI. PERRL.  Neck: Limited range of motion to the left secondary to neck pain and stiffness and large mass in the left posterior paracervical neck muscles. Enlarged exquisitely tender abscess versus infected cyst of the left posterior neck..  Cardiovascular: RRR, no m/r/g, 2+ distal pulses,  Pulmonary/Chest: Effort normal and breath sounds normal. No respiratory distress.  Abdominal: Soft. Bowel sounds are normal. NonTTP, no distension.  Musculoskeletal: Normal range of motion. Non ttp, no effusion.  Neurological: alert and oriented to person, place, and time.  Skin: Skin is warm. No rash noted. non diaphoretic.  Psychiatric: normal mood and affect. behavior is normal. Judgment and thought content normal.   ED Course  Procedures (including critical care time) Labs Review Labs Reviewed - No data to display  Imaging Review No results found.   MDM   1. Abscess   2. Chills   3. Neck stiffness   4. Essential hypertension    Hypertension likely secondary to pain and underlying hypertensive condition.  Patient's neck pain stiffness and headache are concerning for a deep tissue abscess. Unlikely meningitis but patient is on methotrexate and plaque well for her rheumatoid arthritis which puts her at increased risk of severe infection. At this point in time feel the patient will require further evaluation in the emergency room and possibly imaging to evaluate and extent of involvement of possible infection/abscess/cyst. Vital signs stable and patient afebrile at time of exam.  Discussed options for patient to go via EMS versus transport to the emergency room waiting room. Patient requesting EMS transport. We'll start IV and give patient 1 L normal saline bolus.  Shelly Flatten, MD Family Medicine 04/26/2014, 9:27 AM      Ozella Rocks, MD 04/26/14 [phone]

## 2014-04-26 NOTE — ED Notes (Signed)
Pt will be transferred to the ED via EMS. Report has been called to charge nurse [place] RN

## 2014-04-26 NOTE — ED Notes (Signed)
Pt sts her husband is coming to pick her up so that she can take medication for pain.

## 2014-04-26 NOTE — ED Provider Notes (Signed)
CSN: [id number]     Arrival date & time 04/26/14  1106 History   First MD Initiated Contact with Patient 04/26/14 1115     Chief Complaint  Patient presents with  . Neck Pain    Patient reports Cyst on the left posterior neck.      (Consider location/radiation/quality/duration/timing/severity/associated sxs/prior Treatment) HPI   Patient seen today at [facility] uc and sent to the ed for evaluation of a neck mass.  She has a pmh of RA and is on immune modifiers. Patient has had 3 weeks of worsening pain and growth of left sub occipital mass.  She reported systemic sxs of chills, headache, neck pain . Sent to the ED to r/o deep space infection of the neck.   Past Medical History  Diagnosis Date  . Vertigo   . History of stomach ulcers   . GERD (gastroesophageal reflux disease)   . HTN (hypertension)   . Anemia   . Asthma   . Thyroid mass   . MI (myocardial infarction)     2013  . Rheumatoid arthritis    Past Surgical History  Procedure Laterality Date  . Cesarean section    . Cervical discectomy      plates and screws in neck  . Exc benign breast lump    . Esophagogastroduodenoscopy  03/04/2011    Procedure: ESOPHAGOGASTRODUODENOSCOPY (EGD);  Surgeon: Louis Meckel, MD;  Location: [facility];  Service: Endoscopy;  Laterality: N/A;  BOTOX Injection  . Tonsillectomy    . Abdominal hysterectomy      done for menorrhagia, ovaries remain  . Esophageal manometry N/A 02/12/2014    Procedure: ESOPHAGEAL MANOMETRY (EM);  Surgeon: Louis Meckel, MD;  Location: [facility];  Service: Endoscopy;  Laterality: N/A;  . Upper gastrointestinal endoscopy    . Colonoscopy     Family History  Problem Relation Age of Onset  . Alcohol abuse Father   . Hypertension Father   . Heart disease Father   . Colon cancer Maternal Uncle   . Colon polyps Neg Hx   . Esophageal cancer Neg Hx   . Kidney disease Neg Hx   . Stomach cancer Neg Hx   . Rectal cancer Neg Hx   . Diabetes Maternal Grandmother     History  Substance Use Topics  . Smoking status: Never Smoker   . Smokeless tobacco: Never Used  . Alcohol Use: No   OB History    Gravida Para Term Preterm AB TAB SAB Ectopic Multiple Living   5 3 3             Review of Systems Ten systems reviewed and are negative for acute change, except as noted in the HPI.     Allergies  Aspirin; Hydrocodone-acetaminophen; Ibuprofen; Imdur; Meperidine hcl; Morphine; Oxycodone-acetaminophen; Procaine hcl; Toprol xl; and Tramadol  Home Medications   Prior to Admission medications   Medication Sig Start Date End Date Taking? Authorizing Provider  albuterol (PROVENTIL HFA;VENTOLIN HFA) 108 (90 BASE) MCG/ACT inhaler Inhale 2 puffs into the lungs every 6 (six) hours as needed for wheezing or shortness of breath.    Historical Provider, MD  folic acid (FOLVITE) 1 MG tablet Take 1 mg by mouth 2 (two) times daily.    Historical Provider, MD  hydroxychloroquine (PLAQUENIL) 200 MG tablet Take 200 mg by mouth 2 (two) times daily.    Historical Provider, MD  levalbuterol [facility] HFA) 45 MCG/ACT inhaler Inhale 1-2 puffs into the lungs every 4 (four) hours  as needed for wheezing or shortness of breath.     Historical Provider, MD  Methotrexate, PF, (OTREXUP) 20 MG/0.4ML SOAJ Inject into the skin every 7 (seven) days. Patient is unsure of how many units she takes on Friday s. Patient unknown dose    Historical Provider, MD  omeprazole (PRILOSEC) 20 MG capsule Take 20 mg by mouth daily as needed (acid reflux).     Historical Provider, MD   BP 163/98 mmHg  Pulse 69  Temp(Src) 97.9 F (36.6 C) (Oral)  Resp 18  SpO2 100% Physical Exam  Constitutional: She is oriented to person, place, and time. She appears well-developed and well-nourished. No distress.  HENT:  Head: Normocephalic and atraumatic.  Eyes: Conjunctivae are normal. No scleral icterus.  Neck: Normal range of motion.  Cardiovascular: Normal rate, regular rhythm and normal heart sounds.   Exam reveals no gallop and no friction rub.   No murmur heard. Pulmonary/Chest: Effort normal and breath sounds normal. No respiratory distress.  Abdominal: Soft. Bowel sounds are normal. She exhibits no distension and no mass. There is no tenderness. There is no guarding.  Neurological: She is alert and oriented to person, place, and time.  Skin: Skin is warm and dry. She is not diaphoretic.  Nursing note and vitals reviewed.   ED Course  Procedures (including critical care time) Labs Review Labs Reviewed - No data to display  Imaging Review No results found.   EKG Interpretation None      MDM   Final diagnoses:  Cervical lymphadenitis    10:47 AM BP 136/106 mmHg  Pulse 65  Temp(Src) 97.9 F (36.6 C) (Oral)  Resp 18  SpO2 100% Patient seen in shared visit with attending physician. Patient with apparent lymphadenitis. This does not appear to be an abscess. No scalp lesions. She will be discharged with clindamycin. Follow up with ENT     Arthor Captain, PA-C 05/06/14 1104  Zadie Rhine, MD 05/06/14 2306

## 2014-04-26 NOTE — Discharge Instructions (Signed)
Please take your antibiotics as directed.  Please take a benadryl with the pain medication to prevent itching. Zofran is for nausea. Please follow up as soon as possible with your primary care doctor or the ENT doctor.   Cervical Adenitis You have a swollen lymph gland in your neck. This commonly happens with Strep and virus infections, dental problems, insect bites, and injuries about the face, scalp, or neck. The lymph glands swell as the body fights the infection or heals the injury. Swelling and firmness typically lasts for several weeks after the infection or injury is healed. Rarely lymph glands can become swollen because of cancer or TB. Antibiotics are prescribed if there is evidence of an infection. Sometimes an infected lymph gland becomes filled with pus. This condition may require opening up the abscessed gland by draining it surgically. Most of the time infected glands return to normal within two weeks. Do not poke or squeeze the swollen lymph nodes. That may keep them from shrinking back to their normal size. If the lymph gland is still swollen after 2 weeks, further medical evaluation is needed.  SEEK IMMEDIATE MEDICAL CARE IF:  You have difficulty swallowing or breathing, increased swelling, severe pain, or a high fever.  Document Released: 01/05/2005 Document Revised: 03/30/2011 Document Reviewed: 06/27/2006 [facility] Patient Information 2015 [place], [place]. This information is not intended to replace advice given to you by your health care provider. Make sure you discuss any questions you have with your health care provider.

## 2014-04-26 NOTE — ED Notes (Signed)
Per PTAR, Patient is being transported from Urgent Care. Patient started to feel a "cyst" on the posterior left neck about three weeks ago that started as a pin point. Patient stated it has grown over the last three weeks and today she woke up with dizziness and a headache associated with the neck pain. Upon arrival, Patient is alert and oriented x4. Patient reports 10/10 constant aching pain. Vitals per PTAR: 98.1 F, 68 HR, 18 RR, 165/103, 100% on RA.

## 2014-04-26 NOTE — ED Notes (Signed)
Pt states that she has has a knot on her neck for 2 weeks which progressively has gotten worse and the knot has gotten bigger

## 2014-04-26 NOTE — ED Provider Notes (Signed)
Patient seen/examined in the Emergency Department in conjunction with Midlevel Provider Harris Patient reports neck pain Exam : awake/alert, no distress, neck is supple, no meningeal signs, pt is comfortable.  No abscess.  suspsect lymphadenopathy Plan: pain meds, oral antibiotics and close PCP followup   Zadie Rhine, MD 04/26/14 1204

## 2014-04-30 ENCOUNTER — Encounter: Payer: Self-pay | Admitting: Internal Medicine

## 2014-04-30 ENCOUNTER — Ambulatory Visit (INDEPENDENT_AMBULATORY_CARE_PROVIDER_SITE_OTHER): Payer: BC Managed Care – PPO | Admitting: Internal Medicine

## 2014-04-30 VITALS — BP 120/78 | HR 72 | Temp 97.9°F | Wt 222.0 lb

## 2014-04-30 DIAGNOSIS — R42 Dizziness and giddiness: Secondary | ICD-10-CM

## 2014-04-30 DIAGNOSIS — R599 Enlarged lymph nodes, unspecified: Secondary | ICD-10-CM

## 2014-04-30 DIAGNOSIS — R590 Localized enlarged lymph nodes: Secondary | ICD-10-CM

## 2014-04-30 MED ORDER — MECLIZINE HCL 50 MG PO TABS: 25.0000 mg | ORAL_TABLET | Freq: Three times a day (TID) | ORAL | Status: DC | PRN

## 2014-04-30 NOTE — Patient Instructions (Signed)

## 2014-04-30 NOTE — Progress Notes (Signed)
Pre visit review using our clinic review tool, if applicable. No additional management support is needed unless otherwise documented below in the visit note. 

## 2014-04-30 NOTE — Progress Notes (Signed)
Subjective:    Patient ID: Patricia Reed, female    DOB: Jun 19, 1960, 54 y.o.   MRN: [id number]  HPI  Pt presents to the clinic today with c/o hospital follow up. She reports she woke up 04/26/14 with swelling in the left side of her neck. She also c/o headache and dizziness. She went to UC. They felt that she may have had an abscess or infected cyst, but because she is on Plaquenil and MTX, they were concerned about possible meningitis. They sent her to the ER via EMS. The ER did not do any type of head or neck imaging. They did not feel like they needed to do a lumbar puncture. They felt that her left sided neck swelling was due to a swollen lymph node. They treated her with oral antibiotics and pain medication. She reports she has been taking her medication as directed. She is still fatigued and dizzy. The swelling in the left side of her neck has not decreased. She denies difficulty breathing or swallowing. She does have a history of vertigo.  Review of Systems      Past Medical History  Diagnosis Date  . Vertigo   . History of stomach ulcers   . GERD (gastroesophageal reflux disease)   . HTN (hypertension)   . Anemia   . Asthma   . Thyroid mass   . MI (myocardial infarction)     2013  . Rheumatoid arthritis     Current Outpatient Prescriptions  Medication Sig Dispense Refill  . albuterol (PROVENTIL HFA;VENTOLIN HFA) 108 (90 BASE) MCG/ACT inhaler Inhale 2 puffs into the lungs every 6 (six) hours as needed for wheezing or shortness of breath.    . clindamycin (CLEOCIN) 150 MG capsule Take 2 capsules (300 mg total) by mouth every 6 (six) hours. 60 capsule 0  . folic acid (FOLVITE) 1 MG tablet Take 1 mg by mouth 2 (two) times daily.    . hydroxychloroquine (PLAQUENIL) 200 MG tablet Take 200 mg by mouth 2 (two) times daily.    Marland Kitchen levalbuterol (XOPENEX HFA) 45 MCG/ACT inhaler Inhale 1-2 puffs into the lungs every 4 (four) hours as needed for wheezing or shortness of breath.     .  Methotrexate, PF, (OTREXUP) 20 MG/0.4ML SOAJ Inject into the skin every 7 (seven) days. Patient is unsure of how many units she takes on Friday s. Patient unknown dose    . omeprazole (PRILOSEC) 20 MG capsule Take 20 mg by mouth daily as needed (acid reflux).     . ondansetron (ZOFRAN) 4 MG tablet Take 1 tablet (4 mg total) by mouth every 8 (eight) hours as needed for nausea or vomiting. 10 tablet 0  . oxyCODONE-acetaminophen (PERCOCET) 5-325 MG per tablet Take 1-2 tablets by mouth every 4 (four) hours as needed. 20 tablet 0   No current facility-administered medications for this visit.    Allergies  Allergen Reactions  . Aspirin Other (See Comments)    GI bleed  . Hydrocodone-Acetaminophen Hives and Nausea And Vomiting  . Ibuprofen Other (See Comments)    GI bleed  . Imdur [Isosorbide] Other (See Comments)    Reaction unknown  . Meperidine Hcl Hives and Nausea And Vomiting    Short term memory loss  . Morphine Hives and Nausea And Vomiting  . Oxycodone-Acetaminophen Hives and Nausea And Vomiting    Reaction unknown  . Procaine Hcl Other (See Comments)    Ineffective  . Toprol Xl [Metoprolol] Other (See Comments)  Reaction unknown  . Tramadol Itching    Family History  Problem Relation Age of Onset  . Alcohol abuse Father   . Hypertension Father   . Heart disease Father   . Colon cancer Maternal Uncle   . Colon polyps Neg Hx   . Esophageal cancer Neg Hx   . Kidney disease Neg Hx   . Stomach cancer Neg Hx   . Rectal cancer Neg Hx   . Diabetes Maternal Grandmother     History   Social History  . Marital Status: Married    Spouse Name: N/A  . Number of Children: 4  . 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 Smoking status: Never Smoker   . Smokeless tobacco: Never Used  . Alcohol Use: No  . Drug Use: No  . Sexual Activity: Yes    Birth Control/ Protection: None, Surgical      Comment: LAVH   Other Topics Concern  . Not on file   Social History Narrative     Constitutional: Pt reports fatigue. Denies fever, malaise, fatigue, headache or abrupt weight changes.  HEENT: Pt reports swelling of the left side of the neck. Denies eye pain, eye redness, ear pain, ringing in the ears, wax buildup, runny nose, nasal congestion, bloody nose, or sore throat. Respiratory: Denies difficulty breathing, shortness of breath, cough or sputum production.   Cardiovascular: Denies chest pain, chest tightness, palpitations or swelling in the hands or feet.  Neurological: Pt reports dizziness. Denies difficulty with memory, difficulty with speech or problems with balance and coordination.   No other specific complaints in a complete review of systems (except as listed in HPI above).  Objective:   Physical Exam   Pulse 72  Temp(Src) 97.9 F (36.6 C) (Oral)  Wt 222 lb (100.699 kg)  SpO2 98% Wt Readings from Last 3 Encounters:  04/30/14 222 lb (100.699 kg)  04/24/14 225 lb (102.059 kg)  03/27/14 229 lb (103.874 kg)    General: Appears her stated age, obese in NAD. Skin: Warm, dry and intact. No rashes, lesions or ulcerations noted. HEENT: Head: normal shape and size; Ears: Tm's gray and intact, normal light reflex; Throat/Mouth: Teeth present, mucosa pink and moist, no exudate, lesions or ulcerations noted.  Neck: Left occipital adenopathy noted.  Cardiovascular: Normal rate and rhythm. S1,S2 noted.  No murmur, rubs or gallops noted.  Pulmonary/Chest: Normal effort and positive vesicular breath sounds. No respiratory distress. No wheezes, rales or ronchi noted.  Neurological: Alert and oriented. Romberg negative. Coordination normal.   BMET    Component Value Date/Time   NA 139 01/17/2014 1801   K 3.3* 01/17/2014 1801   CL 105 01/17/2014 1801   CO2 21 01/17/2014 1801   GLUCOSE 103* 01/17/2014 1801   BUN 14 01/17/2014 1801   CREATININE 0.76 01/17/2014 1801    CALCIUM 9.0 01/17/2014 1801   GFRNONAA >90 01/17/2014 1801   GFRAA >90 01/17/2014 1801    Lipid Panel  No results found for: CHOL, TRIG, HDL, CHOLHDL, VLDL, LDLCALC  CBC    Component Value Date/Time   WBC 3.6* 01/17/2014 1801   RBC 4.63 01/17/2014 1801   HGB 12.5 01/17/2014 1801   HCT 40.2 01/17/2014 1801   PLT 279 01/17/2014 1801   MCV 86.8 01/17/2014 1801   MCH 27.0 01/17/2014 1801   MCHC 31.1 01/17/2014 1801   RDW 14.1 01/17/2014 1801   LYMPHSABS  1.0 08/21/2013 2004   MONOABS 0.3 08/21/2013 2004   EOSABS 0.1 08/21/2013 2004   BASOSABS 0.0 08/21/2013 2004    Hgb A1C No results found for: HGBA1C      Assessment & Plan:   Left occipital lymphadenopathy:  UC and ER notes reviewed Advised her to continue Cleocin until finished  Tylenol as needed for pain A heating pad or ice may be helpful If persist, consider ultrasound  Vertigo:  eRx for Meclizine given Drink plenty of fluids to avoid dehydration which will make the dizziness worse Make position changes slowly  RTC as needed or if symptoms persist or worsen

## 2014-05-09 ENCOUNTER — Ambulatory Visit (INDEPENDENT_AMBULATORY_CARE_PROVIDER_SITE_OTHER): Payer: BC Managed Care – PPO | Admitting: Gastroenterology

## 2014-05-09 ENCOUNTER — Encounter: Payer: Self-pay | Admitting: Gastroenterology

## 2014-05-09 VITALS — BP 114/82 | HR 72 | Ht 65.0 in | Wt 224.4 lb

## 2014-05-09 DIAGNOSIS — K219 Gastro-esophageal reflux disease without esophagitis: Secondary | ICD-10-CM

## 2014-05-09 DIAGNOSIS — R1314 Dysphagia, pharyngoesophageal phase: Secondary | ICD-10-CM | POA: Diagnosis not present

## 2014-05-09 DIAGNOSIS — K222 Esophageal obstruction: Secondary | ICD-10-CM

## 2014-05-09 MED ORDER — PANTOPRAZOLE SODIUM 40 MG PO TBEC: 40.0000 mg | DELAYED_RELEASE_TABLET | Freq: Every day | ORAL | Status: DC

## 2014-05-09 NOTE — Progress Notes (Signed)
      History of Present Illness:  Patricia Reed has noticed an improvement in dysphagia since dilation.  She occasionally has a hang up of food in her lower chest but this clearly is improved.  She also notes occasional difficulty with swallowing liquids where she feels the liquids trickle down her esophagus.  Previous manometry was normal.  Endoscopy demonstrated perhaps very mild narrowing of the distal esophagus.  She's having frequent pyrosis and is only taking an over-the-counter PPI.    Review of Systems: Pertinent positive and negative review of systems were noted in the above HPI section. All other review of systems were otherwise negative.    Current Medications, Allergies, Past Medical History, Past Surgical History, Family History and Social History were reviewed in [facility] electronic medical record  Vital signs were reviewed in today's medical record. Physical Exam: General: Well developed , well nourished, no acute distress   See Assessment and Plan under Problem List

## 2014-05-09 NOTE — Patient Instructions (Signed)
Follow up as needed Cc.Kerby Nora, MD

## 2014-05-09 NOTE — Assessment & Plan Note (Signed)
She is having frequent pyrosis.    Recommendations #1 begin Protonix 40 mg daily

## 2014-05-09 NOTE — Assessment & Plan Note (Signed)
Despite normal manometry I suspect that there may be a component of esophageal dysmotility in addition to a distal esophageal stricture.  Plan no further therapy at this time.

## 2014-05-09 NOTE — Assessment & Plan Note (Signed)
Improved status post balloon dilation.  Plan to repeat as needed.

## 2014-07-12 ENCOUNTER — Telehealth: Payer: Self-pay | Admitting: *Deleted

## 2014-07-12 NOTE — Telephone Encounter (Signed)
PRIOR AUTH DONE AND PT APPROVED FOR PROTONIX... LETTER SENT IN TO BE SCANNED IN

## 2014-12-25 ENCOUNTER — Telehealth: Payer: Self-pay | Admitting: Family Medicine

## 2014-12-25 ENCOUNTER — Emergency Department (HOSPITAL_COMMUNITY)
Admission: EM | Admit: 2014-12-25 | Discharge: 2014-12-26 | Disposition: A | Discharge status: Home / Self Care | Payer: BC Managed Care – PPO | Attending: Emergency Medicine | Admitting: Emergency Medicine

## 2014-12-25 ENCOUNTER — Encounter (HOSPITAL_COMMUNITY): Payer: Self-pay | Admitting: Adult Health

## 2014-12-25 DIAGNOSIS — I252 Old myocardial infarction: Secondary | ICD-10-CM | POA: Diagnosis not present

## 2014-12-25 DIAGNOSIS — I10 Essential (primary) hypertension: Secondary | ICD-10-CM | POA: Diagnosis not present

## 2014-12-25 DIAGNOSIS — Z85850 Personal history of malignant neoplasm of thyroid: Secondary | ICD-10-CM | POA: Diagnosis not present

## 2014-12-25 DIAGNOSIS — R5383 Other fatigue: Secondary | ICD-10-CM | POA: Diagnosis present

## 2014-12-25 DIAGNOSIS — D649 Anemia, unspecified: Secondary | ICD-10-CM | POA: Insufficient documentation

## 2014-12-25 DIAGNOSIS — J45909 Unspecified asthma, uncomplicated: Secondary | ICD-10-CM | POA: Diagnosis not present

## 2014-12-25 DIAGNOSIS — Z79899 Other long term (current) drug therapy: Secondary | ICD-10-CM | POA: Diagnosis not present

## 2014-12-25 DIAGNOSIS — Z8719 Personal history of other diseases of the digestive system: Secondary | ICD-10-CM | POA: Insufficient documentation

## 2014-12-25 DIAGNOSIS — M3210 Systemic lupus erythematosus, organ or system involvement unspecified: Secondary | ICD-10-CM | POA: Insufficient documentation

## 2014-12-25 DIAGNOSIS — M069 Rheumatoid arthritis, unspecified: Secondary | ICD-10-CM | POA: Diagnosis not present

## 2014-12-25 HISTORY — DX: Reserved for concepts with insufficient information to code with codable children: IMO0002

## 2014-12-25 HISTORY — DX: Systemic lupus erythematosus, unspecified: M32.9

## 2014-12-25 LAB — URINALYSIS, ROUTINE W REFLEX MICROSCOPIC
BILIRUBIN URINE: NEGATIVE
GLUCOSE, UA: NEGATIVE mg/dL
HGB URINE DIPSTICK: NEGATIVE
Ketones, ur: 15 mg/dL — AB
Nitrite: NEGATIVE
PROTEIN: NEGATIVE mg/dL
SPECIFIC GRAVITY, URINE: 1.022 (ref 1.005–1.030)
pH: 7 (ref 5.0–8.0)

## 2014-12-25 LAB — CBC
HCT: 40.4 % (ref 36.0–46.0)
Hemoglobin: 12.5 g/dL (ref 12.0–15.0)
MCH: 28.1 pg (ref 26.0–34.0)
MCHC: 30.9 g/dL (ref 30.0–36.0)
MCV: 90.8 fL (ref 78.0–100.0)
PLATELETS: 292 10*3/uL (ref 150–400)
RBC: 4.45 MIL/uL (ref 3.87–5.11)
RDW: 13.9 % (ref 11.5–15.5)
WBC: 4.3 10*3/uL (ref 4.0–10.5)

## 2014-12-25 LAB — BASIC METABOLIC PANEL
Anion gap: 10 (ref 5–15)
BUN: 11 mg/dL (ref 6–20)
CALCIUM: 9.3 mg/dL (ref 8.9–10.3)
CO2: 28 mmol/L (ref 22–32)
CREATININE: 0.75 mg/dL (ref 0.44–1.00)
Chloride: 104 mmol/L (ref 101–111)
GFR calc Af Amer: >60 mL/min (ref >60)
GFR calc non Af Amer: >60 mL/min (ref >60)
GLUCOSE: 83 mg/dL (ref 65–99)
Potassium: 3.6 mmol/L (ref 3.5–5.1)
Sodium: 142 mmol/L (ref 135–145)

## 2014-12-25 LAB — URINE MICROSCOPIC-ADD ON

## 2014-12-25 LAB — I-STAT TROPONIN, ED: TROPONIN I, POC: 0 ng/mL (ref 0.00–0.08)

## 2014-12-25 MED ORDER — SODIUM CHLORIDE 0.9 % IV BOLUS (SEPSIS)
1000.0000 mL | Freq: Once | INTRAVENOUS | Status: AC
  Administered 2014-12-25: 1000 mL via INTRAVENOUS

## 2014-12-25 MED ORDER — HYDROMORPHONE HCL 1 MG/ML IJ SOLN
0.5000 mg | Freq: Once | INTRAMUSCULAR | Status: AC
  Administered 2014-12-25: 0.5 mg via INTRAVENOUS
  Filled 2014-12-25: qty 1

## 2014-12-25 MED ORDER — SODIUM CHLORIDE 0.9 % IV BOLUS (SEPSIS)
500.0000 mL | Freq: Once | INTRAVENOUS | Status: AC
  Administered 2014-12-25: 500 mL via INTRAVENOUS

## 2014-12-25 MED ORDER — ONDANSETRON HCL 4 MG/2ML IJ SOLN
4.0000 mg | Freq: Once | INTRAMUSCULAR | Status: AC
  Administered 2014-12-25: 4 mg via INTRAVENOUS
  Filled 2014-12-25: qty 2

## 2014-12-25 NOTE — Telephone Encounter (Signed)
Agreed -

## 2014-12-25 NOTE — ED Notes (Signed)
Pt vomited during the standup portion of the orthostatic vital signs.

## 2014-12-25 NOTE — ED Notes (Signed)
Upstill PA at bedside 

## 2014-12-25 NOTE — Telephone Encounter (Signed)
[facility] Day - Client TELEPHONE ADVICE RECORD   [facility]    --------------------------------------------------------------------------------   Patient Name: Patricia Reed  Gender: Female  DOB: 05/31/1960   Age: 54 Y 7 M 20 D  Return Phone Number: [phone] (Primary), [phone] (Secondary)  Address: [address]    City/State/Zip: [place]  [address]   Client [facility] Day - Client  Client Site [facility] - Day  Physician Ermalene Searing, [place]   Contact Type Call  Call Type Triage / Clinical  Relationship To Patient Self  Appointment Disposition EMR Appointment Not Necessary  Info pasted into Epic Yes  Return Phone Number [phone] (Primary)  Chief Complaint Dizziness  Initial Comment Caller states, feels strange, has been this way for 2 weeks, she gets light headed, eyes sometimes go out of focus. Yesterday her blood pressure was low. 3 Yrs ago she had a heart attack w/ similar sx.   PreDisposition Go to ED       Nurse Assessment  Nurse: Harlon Flor, RN, Darl Pikes Date/Time (Eastern Time): 12/25/2014 4:09:52 PM  Confirm and document reason for call. If symptomatic, describe symptoms. ---Caller states, feels strange, has been this way for 2 weeks, she gets light headed, eyes sometimes go out of focus. Yesterday her blood pressure was low. 3 Yrs ago she had a heart attack w/ similar sx. In 2013 had heart cath. She did not see a doctor within these two weeks.    Has the patient traveled out of the country within the last 30 days? ---No    Does the patient have any new or worsening symptoms? ---Yes    Will a triage be completed? ---Yes    Related visit to physician within the last 2 weeks? ---No    Does the PT have any chronic conditions? (i.e. diabetes, asthma, etc.) ---Yes    List chronic conditions. ---plaquinil for lupus autoimmune disease Otrexa a injection every  Friday. Sees a rheumatologist to see them tomorrow    Did the patient indicate they were pregnant? ---No    Is this a behavioral health or substance abuse call? ---No           Guidelines          Guideline Title Affirmed Question Affirmed Notes Nurse Date/Time (Eastern Time)  Vision Loss or Change [1] Blurred vision or visual changes AND [2] present now AND [3] sudden onset or new (e.g., minutes, hours, days) (Exception: previously diagnosed migraine headaches with same symptoms)    Harlon Flor, RN, Darl Pikes 12/25/2014 4:14:57 PM    Disp. Time Lamount Cohen Time) Disposition Final User         12/25/2014 4:16:36 PM Go to ED Now (or PCP triage) Yes Harlon Flor, RN, Helane Rima Understands: Yes  Disagree/Comply: Comply       Care Advice Given Per Guideline        GO TO ED NOW (OR PCP TRIAGE): DRIVING: Another adult should drive. CARE ADVICE given per Vision Loss or Change (Adult) guideline.    After Care Instructions Given        Call Event Type User Date / Time Description        --------------------------------------------------------------------------------            Referrals  [facility] - ED    [facility] Day - Client TELEPHONE ADVICE RECORD  [facility]    --------------------------------------------------------------------------------   Patient Name: Patricia Reed  Gender: Female  DOB: 03/01/1960   Age: 54 Y 7 M 20 D  Return Phone Number: [phone] (Primary), [phone] (Secondary)  Address: [address]    City/State/Zip: [place]  [address]   Client [facility] Day - Client  Client Site [facility] - Day  Physician Ermalene Searing, [place]   Contact Type Call  Call Type Triage / Clinical  Relationship To Patient Self  Appointment Disposition EMR Appointment Not Necessary  Info pasted into Epic Yes  Return Phone Number [phone]  (Primary)  Chief Complaint Dizziness  Initial Comment Caller states, feels strange, has been this way for 2 weeks, she gets light headed, eyes sometimes go out of focus. Yesterday her blood pressure was low. 3 Yrs ago she had a heart attack w/ similar sx.   PreDisposition Go to ED       Nurse Assessment  Nurse: Harlon Flor, RN, Darl Pikes Date/Time (Eastern Time): 12/25/2014 4:09:52 PM  Confirm and document reason for call. If symptomatic, describe symptoms. ---Caller states, feels strange, has been this way for 2 weeks, she gets light headed, eyes sometimes go out of focus. Yesterday her blood pressure was low. 3 Yrs ago she had a heart attack w/ similar sx. In 2013 had heart cath. She did not see a doctor within these two weeks.    Has the patient traveled out of the country within the last 30 days? ---No    Does the patient have any new or worsening symptoms? ---Yes    Will a triage be completed? ---Yes    Related visit to physician within the last 2 weeks? ---No    Does the PT have any chronic conditions? (i.e. diabetes, asthma, etc.) ---Yes    List chronic conditions. ---plaquinil for lupus autoimmune disease Otrexa a injection every Friday. Sees a rheumatologist to see them tomorrow    Did the patient indicate they were pregnant? ---No    Is this a behavioral health or substance abuse call? ---No           Guidelines          Guideline Title Affirmed Question Affirmed Notes Nurse Date/Time (Eastern Time)  Vision Loss or Change [1] Blurred vision or visual changes AND [2] present now AND [3] sudden onset or new (e.g., minutes, hours, days) (Exception: previously diagnosed migraine headaches with same symptoms)    Harlon Flor, RN, Darl Pikes 12/25/2014 4:14:57 PM    Disp. Time Lamount Cohen Time) Disposition Final User         12/25/2014 4:16:36 PM Go to ED Now (or PCP triage) Yes Harlon Flor, RN, Helane Rima Understands: Yes  Disagree/Comply: Comply       Care Advice Given Per  Guideline        GO TO ED NOW (OR PCP TRIAGE): DRIVING: Another adult should drive. CARE ADVICE given per Vision Loss or Change (Adult) guideline.    After Care Instructions Given        Call Event Type User Date / Time Description        --------------------------------------------------------------------------------            Referrals  [facility] - ED

## 2014-12-25 NOTE — ED Provider Notes (Signed)
CSN: [id number]     Arrival date & time 12/25/14  1720 History   First MD Initiated Contact with Patient 12/25/14 2028     Chief Complaint  Patient presents with  . Fatigue     (Consider location/radiation/quality/duration/timing/severity/associated sxs/prior Treatment) HPI Comments: Patient with a history of rheumatoid arthritis, lupus, non-obstructive heart disease, asthma presents with increasing fatigue since last week. She complains of generalized joint pain that she attributes to her arthritis, but no other pain. No fever, nausea or vomiting. She has headache that she describes as extending from the left forehead to the left shoulder. No recent changes in medications or dosing of usual medications. No SOB, abdominal pain, diarrhea, urinary symptoms.    The history is provided by the patient. No language interpreter was used.    Past Medical History  Diagnosis Date  . Vertigo   . History of stomach ulcers   . GERD (gastroesophageal reflux disease)   . HTN (hypertension)   . Anemia   . Asthma   . Thyroid mass   . MI (myocardial infarction) (HCC)     2013  . Rheumatoid arthritis (HCC)   . Lupus [facility])    Past Surgical History  Procedure Laterality Date  . Cesarean section    . Cervical discectomy      plates and screws in neck  . Exc benign breast lump    . Esophagogastroduodenoscopy  03/04/2011    Procedure: ESOPHAGOGASTRODUODENOSCOPY (EGD);  Surgeon: Louis Meckel, MD;  Location: [facility];  Service: Endoscopy;  Laterality: N/A;  BOTOX Injection  . Tonsillectomy    . Abdominal hysterectomy      done for menorrhagia, ovaries remain  . Esophageal manometry N/A 02/12/2014    Procedure: ESOPHAGEAL MANOMETRY (EM);  Surgeon: Louis Meckel, MD;  Location: [facility];  Service: Endoscopy;  Laterality: N/A;  . Upper gastrointestinal endoscopy    . Colonoscopy     Family History  Problem Relation Age of Onset  . Alcohol abuse Father   . Hypertension Father   . Heart  disease Father   . Colon cancer Maternal Uncle   . Colon polyps Neg Hx   . Esophageal cancer Neg Hx   . Kidney disease Neg Hx   . Stomach cancer Neg Hx   . Rectal cancer Neg Hx   . Diabetes Maternal Grandmother    Social History  Substance Use Topics  . Smoking status: Never Smoker   . Smokeless tobacco: Never Used  . Alcohol Use: No   OB History    Gravida Para Term Preterm AB TAB SAB Ectopic Multiple Living   5 3 3             Review of Systems  Constitutional: Positive for activity change and fatigue. Negative for fever and chills.  HENT: Negative.   Eyes: Negative for pain.  Respiratory: Negative.  Negative for shortness of breath.   Cardiovascular: Negative.  Negative for chest pain.  Gastrointestinal: Negative.  Negative for nausea, vomiting, abdominal pain and diarrhea.  Genitourinary: Negative.  Negative for dysuria.  Musculoskeletal: Positive for arthralgias. Negative for neck stiffness.  Skin: Negative.  Negative for color change.  Neurological: Positive for weakness and light-headedness. Negative for syncope.      Allergies  Aspirin; Hydrocodone-acetaminophen; Ibuprofen; Imdur; Meperidine hcl; Morphine; Oxycodone-acetaminophen; Procaine hcl; Toprol xl; and Tramadol  Home Medications   Prior to Admission medications   Medication Sig Start Date End Date Taking? Authorizing Provider  albuterol (PROVENTIL HFA;VENTOLIN HFA) 108 (  90 BASE) MCG/ACT inhaler Inhale 2 puffs into the lungs every 6 (six) hours as needed for wheezing or shortness of breath.   Yes Historical Provider, MD  folic acid (FOLVITE) 1 MG tablet Take 1 mg by mouth 2 (two) times daily.   Yes Historical Provider, MD  hydroxychloroquine (PLAQUENIL) 200 MG tablet Take 200 mg by mouth 2 (two) times daily.   Yes Historical Provider, MD  levalbuterol [facility] HFA) 45 MCG/ACT inhaler Inhale 1-2 puffs into the lungs every 4 (four) hours as needed for wheezing or shortness of breath.    Yes Historical Provider,  MD  Methotrexate, PF, (OTREXUP) 20 MG/0.4ML SOAJ Inject into the skin every 7 (seven) days. Patient is unsure of how many units she takes on Friday s. Patient unknown dose   Yes Historical Provider, MD   BP 122/59 mmHg  Pulse 86  Temp(Src) 98.5 F (36.9 C) (Oral)  Resp 16  Ht 5\' 5"  (1.651 m)  Wt 104.781 kg  BMI 38.44 kg/m2  SpO2 98% Physical Exam  Constitutional: She is oriented to person, place, and time. She appears well-developed and well-nourished.  HENT:  Head: Normocephalic.  Eyes: Conjunctivae are normal.  No conjunctival pallor.  Neck: Normal range of motion. Neck supple.  Cardiovascular: Normal rate and regular rhythm.   No murmur heard. No carotid bruit.  Pulmonary/Chest: Effort normal and breath sounds normal.  Abdominal: Soft. Bowel sounds are normal. There is no tenderness. There is no rebound and no guarding.  Musculoskeletal: Normal range of motion.  FROM all joints. No swelling, warmth or redness.   Neurological: She is alert and oriented to person, place, and time.  Skin: Skin is warm and dry. No rash noted.  Psychiatric: She has a normal mood and affect.    ED Course  Procedures (including critical care time) Labs Review Labs Reviewed  BASIC METABOLIC PANEL  CBC  URINALYSIS, ROUTINE W REFLEX MICROSCOPIC (NOT AT [facility])  CBG MONITORING, ED  I-STAT TROPOININ, ED   Results for orders placed or performed during the hospital encounter of 12/25/14  Basic metabolic panel  Result Value Ref Range   Sodium 142 135 - 145 mmol/L   Potassium 3.6 3.5 - 5.1 mmol/L   Chloride 104 101 - 111 mmol/L   CO2 28 22 - 32 mmol/L   Glucose, Bld 83 65 - 99 mg/dL   BUN 11 6 - 20 mg/dL   Creatinine, Ser 7.61 0.44 - 1.00 mg/dL   Calcium 9.3 8.9 - 95.0 mg/dL   GFR calc non Af Amer >60 >60 mL/min   GFR calc Af Amer >60 >60 mL/min   Anion gap 10 5 - 15  CBC  Result Value Ref Range   WBC 4.3 4.0 - 10.5 K/uL   RBC 4.45 3.87 - 5.11 MIL/uL   Hemoglobin 12.5 12.0 - 15.0 g/dL    HCT 93.2 67.1 - 24.5 %   MCV 90.8 78.0 - 100.0 fL   MCH 28.1 26.0 - 34.0 pg   MCHC 30.9 30.0 - 36.0 g/dL   RDW 80.9 98.3 - 38.2 %   Platelets 292 150 - 400 K/uL  Urinalysis, Routine w reflex microscopic (not at [facility])  Result Value Ref Range   Color, Urine YELLOW YELLOW   APPearance CLOUDY (A) CLEAR   Specific Gravity, Urine 1.022 1.005 - 1.030   pH 7.0 5.0 - 8.0   Glucose, UA NEGATIVE NEGATIVE mg/dL   Hgb urine dipstick NEGATIVE NEGATIVE   Bilirubin Urine NEGATIVE NEGATIVE  Ketones, ur 15 (A) NEGATIVE mg/dL   Protein, ur NEGATIVE NEGATIVE mg/dL   Nitrite NEGATIVE NEGATIVE   Leukocytes, UA MODERATE (A) NEGATIVE  Urine microscopic-add on  Result Value Ref Range   Squamous Epithelial / LPF 0-5 (A) NONE SEEN   WBC, UA 6-30 0 - 5 WBC/hpf   RBC / HPF 0-5 0 - 5 RBC/hpf   Bacteria, UA FEW (A) NONE SEEN   Urine-Other MUCOUS PRESENT   I-Stat Troponin, ED (not at [facility])  Result Value Ref Range   Troponin i, poc 0.00 0.00 - 0.08 ng/mL   Comment 3             Imaging Review No results found. I have personally reviewed and evaluated these images and lab results as part of my medical decision-making.   EKG Interpretation   Date/Time:  Tuesday December 25 2014 17:38:12 EST Ventricular Rate:  73 PR Interval:  154 QRS Duration: 88 QT Interval:  402 QTC Calculation: 442 R Axis:   24 Text Interpretation:  Normal sinus rhythm with sinus arrhythmia Normal ECG  When compared with ECG of 01/17/2014, No significant change was found  Confirmed by [facility]  MD, DAVID ([id number]) on 12/25/2014 7:56:20 PM      MDM   Final diagnoses:  None    1. Fatigue  The patient is feeling better with IV fluids. One episode vomiting after Dilaudid with nausea resolved after Zofran. No further vomiting on ambulation   VSS. Labs unremarkable. Urine cultured for borderline results. She is stable for discharge home.     Elpidio Anis, PA-C 12/26/14 [id number]  Dione Booze, MD 12/26/14 [phone]

## 2014-12-25 NOTE — ED Notes (Signed)
presnets with 8 months of intermittent headaches and nosebleeds that she states she can feel in her head but once her nose bleeds she no longer has a headache-since Thanksgiving reports increases fatigue, left arm and left neck "feels weird" it doesn't hurt but it feels weird and has been off and on since Thanksgiving. "I just feel Blah and I had a heart attack in 2013 and felt the same way-never had any pain but felt like this" endorses feeling off balance and blurred vision since Thanksgiving off and on as well.

## 2014-12-26 ENCOUNTER — Telehealth: Payer: Self-pay

## 2014-12-26 NOTE — Telephone Encounter (Signed)
PLEASE NOTE: All timestamps contained within this report are represented as [place] Standard Time. CONFIDENTIALTY NOTICE: This fax transmission is intended only for the addressee. It contains information that is legally privileged, confidential or otherwise protected from use or disclosure. If you are not the intended recipient, you are strictly prohibited from reviewing, disclosing, copying using or disseminating any of this information or taking any action in reliance on or regarding this information. If you have received this fax in error, please notify us immediately by telephone so that we can arrange for its return to [place]. Phone: [phone], Toll-Free: [phone], Fax: [phone] Page: 1 of 2 Call Id: [id number] [facility] Day - Client TELEPHONE ADVICE RECORD [facility] Call [facility] Patient Name: Patricia Reed Gender: Female DOB: [birth year] Age: 54 Y 7 M 21 D Return Phone Number: [phone] (Primary) Address: [address] City/State/Zip: [place] [address] Client [facility] Day - Client Client Site [facility] [place] - Day Physician Ermalene Searing, [place] Contact Type Call Call Type Triage / Clinical Relationship To Patient Self Appointment Disposition EMR Appointment Attempted - Not Scheduled Info pasted into Epic No Return Phone Number [phone] (Primary) Chief Complaint Flu Symptom Initial Comment Caller states she went to ER via triage nurse. Has flu and UTI. Feeling so bad. PreDisposition Go to Urgent Care/Walk-In Clinic Nurse Assessment Nurse: Nicanor Bake, RN, Marylene Land Date/Time Lamount Cohen Time): 12/26/2014 4:20:16 PM Confirm and document reason for call. If symptomatic, describe symptoms. ---Caller states she was seen in the ER yesterday and Dx with a mild UTI. Then last night she started with fever/chills and feeling really bad. She has also vomited. She went to her ortho appointment and they said  she was too sick and needed to go home and get in bed. She was not Rx'd any medications. She states she does not even feel like going into the office. She was at [facility]. Has the patient traveled out of the country within the last 30 days? ---No Does the patient have any new or worsening symptoms? ---Yes Will a triage be completed? ---Yes Related visit to physician within the last 2 weeks? ---Yes Does the PT have any chronic conditions? (i.e. diabetes, asthma, etc.) ---Yes List chronic conditions. ---asthma, Lupus, Did the patient indicate they were pregnant? ---No Is this a behavioral health or substance abuse call? ---No Guidelines Guideline Title Affirmed Question Affirmed Notes Nurse Date/Time (Eastern Time) Influenza - Seasonal [1] Fever > 100.5 F (38.1 C) AND [2] diabetes mellitus or weak immune system (e.g., HIV positive, cancer [place], RN, Marylene Land 12/26/2014 4:24:56 PM PLEASE NOTE: All timestamps contained within this report are represented as [place] Standard Time. CONFIDENTIALTY NOTICE: This fax transmission is intended only for the addressee. It contains information that is legally privileged, confidential or otherwise protected from use or disclosure. If you are not the intended recipient, you are strictly prohibited from reviewing, disclosing, copying using or disseminating any of this information or taking any action in reliance on or regarding this information. If you have received this fax in error, please notify us immediately by telephone so that we can arrange for its return to [place]. Phone: [phone], Toll-Free: [phone], Fax: [phone] Page: 2 of 2 Call Id: [id number] Guidelines Guideline Title Affirmed Question Affirmed Notes Nurse Date/Time Lamount Cohen Time) chemo, splenectomy, organ transplant, chronic steroids) Disp. Time Lamount Cohen Time) Disposition Final User 12/26/2014 4:30:25 PM See Physician within 4 Hours (or PCP triage) Yes Nicanor Bake, RN,  Rosalyn Charters Understands: Yes Disagree/Comply:  Comply Care Advice Given Per Guideline SEE PHYSICIAN WITHIN 4 HOURS (or PCP triage): CARE ADVICE given per INFLUENZA - SEASONAL (Adult) guideline. CALL BACK IF: * You become worse. NO ASPIRIN: Do not use aspirin for treatment of fever or pain (Reason: there is an association between influenza and Ashley Jacobs' Syndrome). After Care Instructions Given Call Event Type User Date / Time Description Referrals GO TO FACILITY OTHER - SPECIFY

## 2014-12-26 NOTE — Discharge Instructions (Signed)
Fatigue  Fatigue is feeling tired all of the time, a lack of energy, or a lack of motivation. Occasional or mild fatigue is often a normal response to activity or life in general. However, long-lasting (chronic) or extreme fatigue may indicate an underlying medical condition.  HOME CARE INSTRUCTIONS   Watch your fatigue for any changes. The following actions may help to lessen any discomfort you are feeling:  · Talk to your health care provider about how much sleep you need each night. Try to get the required amount every night.  · Take medicines only as directed by your health care provider.  · Eat a healthy and nutritious diet. Ask your health care provider if you need help changing your diet.  · Drink enough fluid to keep your urine clear or pale yellow.  · Practice ways of relaxing, such as yoga, meditation, massage therapy, or acupuncture.  · Exercise regularly.    · Change situations that cause you stress. Try to keep your work and personal routine reasonable.  · Do not abuse illegal drugs.  · Limit alcohol intake to no more than 1 drink per day for nonpregnant women and 2 drinks per day for men. One drink equals 12 ounces of beer, 5 ounces of wine, or 1½ ounces of hard liquor.  · Take a multivitamin, if directed by your health care provider.  SEEK MEDICAL CARE IF:   · Your fatigue does not get better.  · You have a fever.    · You have unintentional weight loss or gain.  · You have headaches.    · You have difficulty:      Falling asleep.    Sleeping throughout the night.  · You feel angry, guilty, anxious, or sad.     · You are unable to have a bowel movement (constipation).    · You skin is dry.     · Your legs or another part of your body is swollen.    SEEK IMMEDIATE MEDICAL CARE IF:   · You feel confused.    · Your vision is blurry.  · You feel faint or pass out.    · You have a severe headache.    · You have severe abdominal, pelvic, or back pain.    · You have chest pain, shortness of breath, or an  irregular or fast heartbeat.    · You are unable to urinate or you urinate less than normal.    · You develop abnormal bleeding, such as bleeding from the rectum, vagina, nose, lungs, or nipples.  · You vomit blood.     · You have thoughts about harming yourself or committing suicide.    · You are worried that you might harm someone else.       This information is not intended to replace advice given to you by your health care provider. Make sure you discuss any questions you have with your health care provider.     Document Released: 11/02/2006 Document Revised: 01/26/2014 Document Reviewed: 05/09/2013  Elsevier Interactive Patient Education ©2016 [employer] Inc[employer]

## 2014-12-26 NOTE — Telephone Encounter (Signed)
Spoke with pt and she is going to [facility] on [address] now. Pt will cb with update.

## 2014-12-26 NOTE — Telephone Encounter (Signed)
Agree pt needs to be seen ASAP.

## 2014-12-26 NOTE — ED Notes (Signed)
Pt left with all her belongings and ambulated out of the treatment area.  

## 2014-12-31 ENCOUNTER — Telehealth: Payer: Self-pay

## 2014-12-31 NOTE — Telephone Encounter (Signed)
PLEASE NOTE: All timestamps contained within this report are represented as [place] Standard Time. CONFIDENTIALTY NOTICE: This fax transmission is intended only for the addressee. It contains information that is legally privileged, confidential or otherwise protected from use or disclosure. If you are not the intended recipient, you are strictly prohibited from reviewing, disclosing, copying using or disseminating any of this information or taking any action in reliance on or regarding this information. If you have received this fax in error, please notify us immediately by telephone so that we can arrange for its return to [place]. Phone: [phone], Toll-Free: [phone], Fax: [phone] Page: 1 of 2 Call Id: [id number] [facility] Night - Client TELEPHONE ADVICE RECORD [facility] Call Center Patient Name: Patricia Reed Gender: Female DOB: October 04, 1960 Age: 54 Y 7 M 24 D Return Phone Number: [phone] (Primary) Address: [place] [address] Client [facility] Night - Client Client Site [facility] [place] - Night Physician Ermalene Searing, [place] Contact Type Call Call Type Triage / Clinical Relationship To Patient Self Return Phone Number [phone] (Primary) Chief Complaint Pain - Generalized Initial Comment Caller states muscles are tightening everywhere. PreDisposition Did not know what to do Nurse Assessment Nurse: Tera Mater, RN, Elnita Maxwell Date/Time (Eastern Time): 12/29/2014 10:22:18 AM Confirm and document reason for call. If symptomatic, describe symptoms. ---Caller states that she began having fatigue with weakness since Tues. She was seen in the ed on Tuesday and uc on Wed. She was dx with strep throat, UTI and flu like syndrome. Pt was started on cephalexin but cont to have fatigue with bodyaches. Denies fever. Has the patient traveled out of the country within the last 30 days? ---Not  Applicable Does the patient have any new or worsening symptoms? ---Yes Will a triage be completed? ---Yes Related visit to physician within the last 2 weeks? ---Yes Does the PT have any chronic conditions? (i.e. diabetes, asthma, etc.) ---Yes List chronic conditions. ---lupus, MI Did the patient indicate they were pregnant? ---No Is this a behavioral health or substance abuse call? ---No Guidelines Guideline Title Affirmed Question Affirmed Notes Nurse Date/Time Lamount Cohen Time) Strep Throat Infection on Antibiotic Follow-up Call [1] Taking antibiotic < 72 hours (3 days) for strep throat AND [2] sore throat not improved Tera Mater, RN, Cheryl 12/29/2014 10:28:57 AM Disp. Time Lamount Cohen Time) Disposition Final User 12/29/2014 10:34:02 AM Home Care Yes Tera Mater, RN, Elizabeth Sauer Understands: Yes PLEASE NOTE: All timestamps contained within this report are represented as [place] Standard Time. CONFIDENTIALTY NOTICE: This fax transmission is intended only for the addressee. It contains information that is legally privileged, confidential or otherwise protected from use or disclosure. If you are not the intended recipient, you are strictly prohibited from reviewing, disclosing, copying using or disseminating any of this information or taking any action in reliance on or regarding this information. If you have received this fax in error, please notify us immediately by telephone so that we can arrange for its return to [place]. Phone: [phone], Toll-Free: [phone], Fax: [phone] Page: 2 of 2 Call Id: [id number] Disagree/Comply: Comply Care Advice Given Per Guideline HOME CARE: You should be able to treat this at home. REASSURANCE: * You gradually get better over 2-3 days. * Often there is not improvement the first day. * Most bacterial infections do not respond to the first dose of an antibiotic. * Sip warm chicken broth or apple juice. SORE THROAT - For relief of sore throat: * Gargle with  warm salt water four  times a day. To make salt water, put 1/2 teaspoon of salt in 8 oz (240 ml) of warm water. SOFT DIET: * Eat a soft diet. * Cold drinks, popsicles, and milk shakes are especially good. Avoid citrus fruits. * Drink plenty of liquids. This is important to prevent dehydation. DRINK PLENTY LIQUIDS: * A healthy adult should drink 8 cups (240 ml) or more of liquid each day. CARE ADVICE given per Strep Throat Infection on Antibiotic Follow-Up Call (Adult) guideline. * You become worse. CALL BACK IF: * Symptoms last over 3 days on antibiotics After Care Instructions Given Call Event Type User Date / Time Description

## 2015-09-24 ENCOUNTER — Ambulatory Visit (HOSPITAL_COMMUNITY)
Admission: EM | Admit: 2015-09-24 | Discharge: 2015-09-24 | Disposition: A | Discharge status: Other Acute Inpatient Hospital | Payer: BC Managed Care – PPO

## 2015-10-11 ENCOUNTER — Emergency Department
Admission: EM | Admit: 2015-10-11 | Discharge: 2015-10-11 | Disposition: A | Discharge status: Home / Self Care | Payer: BC Managed Care – PPO | Attending: Emergency Medicine | Admitting: Emergency Medicine

## 2015-10-11 ENCOUNTER — Emergency Department: Payer: BC Managed Care – PPO

## 2015-10-11 ENCOUNTER — Encounter: Payer: Self-pay | Admitting: Urgent Care

## 2015-10-11 DIAGNOSIS — I252 Old myocardial infarction: Secondary | ICD-10-CM | POA: Diagnosis not present

## 2015-10-11 DIAGNOSIS — J45909 Unspecified asthma, uncomplicated: Secondary | ICD-10-CM | POA: Diagnosis not present

## 2015-10-11 DIAGNOSIS — IMO0001 Reserved for inherently not codable concepts without codable children: Secondary | ICD-10-CM

## 2015-10-11 DIAGNOSIS — R519 Headache, unspecified: Secondary | ICD-10-CM

## 2015-10-11 DIAGNOSIS — R51 Headache: Secondary | ICD-10-CM | POA: Diagnosis not present

## 2015-10-11 DIAGNOSIS — I10 Essential (primary) hypertension: Secondary | ICD-10-CM | POA: Insufficient documentation

## 2015-10-11 DIAGNOSIS — R030 Elevated blood-pressure reading, without diagnosis of hypertension: Secondary | ICD-10-CM

## 2015-10-11 LAB — URINALYSIS COMPLETE WITH MICROSCOPIC (ARMC ONLY)
BILIRUBIN URINE: NEGATIVE
Glucose, UA: NEGATIVE mg/dL
HGB URINE DIPSTICK: NEGATIVE
Ketones, ur: NEGATIVE mg/dL
NITRITE: NEGATIVE
PH: 5 (ref 5.0–8.0)
Protein, ur: NEGATIVE mg/dL
SPECIFIC GRAVITY, URINE: 1.027 (ref 1.005–1.030)

## 2015-10-11 LAB — CBC
HCT: 39.1 % (ref 35.0–47.0)
HEMOGLOBIN: 12.8 g/dL (ref 12.0–16.0)
MCH: 28.7 pg (ref 26.0–34.0)
MCHC: 32.7 g/dL (ref 32.0–36.0)
MCV: 87.9 fL (ref 80.0–100.0)
Platelets: 292 10*3/uL (ref 150–440)
RBC: 4.45 MIL/uL (ref 3.80–5.20)
RDW: 15 % — ABNORMAL HIGH (ref 11.5–14.5)
WBC: 4.5 10*3/uL (ref 3.6–11.0)

## 2015-10-11 LAB — BASIC METABOLIC PANEL
ANION GAP: 6 (ref 5–15)
BUN: 17 mg/dL (ref 6–20)
CALCIUM: 9.2 mg/dL (ref 8.9–10.3)
CO2: 30 mmol/L (ref 22–32)
Chloride: 104 mmol/L (ref 101–111)
Creatinine, Ser: 0.7 mg/dL (ref 0.44–1.00)
GLUCOSE: 96 mg/dL (ref 65–99)
Potassium: 3.9 mmol/L (ref 3.5–5.1)
SODIUM: 140 mmol/L (ref 135–145)

## 2015-10-11 LAB — TROPONIN I

## 2015-10-11 LAB — HEPATIC FUNCTION PANEL
ALK PHOS: 56 U/L (ref 38–126)
ALT: 15 U/L (ref 14–54)
AST: 18 U/L (ref 15–41)
Albumin: 4.1 g/dL (ref 3.5–5.0)
BILIRUBIN TOTAL: 0.8 mg/dL (ref 0.3–1.2)
TOTAL PROTEIN: 8 g/dL (ref 6.5–8.1)

## 2015-10-11 LAB — SEDIMENTATION RATE: SED RATE: 37 mm/h — AB (ref 0–30)

## 2015-10-11 MED ORDER — SODIUM CHLORIDE 0.9 % IV BOLUS (SEPSIS)
1000.0000 mL | Freq: Once | INTRAVENOUS | Status: AC
  Administered 2015-10-11: 1000 mL via INTRAVENOUS

## 2015-10-11 NOTE — ED Notes (Signed)
Pt. Verbalizes understanding of d/c instructions and follow-up. VS stable and pain controlled per pt.  Pt. In NAD at time of d/c and denies further concerns regarding this visit. Pt. Stable at the time of departure from the unit, departing unit by the safest and most appropriate manner per that pt condition and limitations. Pt advised to return to the ED at any time for emergent concerns, or for new/worsening symptoms.   

## 2015-10-11 NOTE — ED Notes (Signed)
Pt provided [place] sandwich tray per ED MD

## 2015-10-11 NOTE — ED Triage Notes (Addendum)
Patient presents with c/o elevated blood pressure for "about two weeks". Patient advising that she has had headaches since Wednesday. (+) blurred vision today. Of note, patient with PMH significant for MI at the age of 63; cardiac cath was negative at that time per her report. Patient is not currently on antihypertensives; states, "this is a new problem for me. I have never had blood pressure problems before". (+) dizziness reported in triage.

## 2015-10-11 NOTE — ED Provider Notes (Signed)
[facility] Emergency Department Provider Note   ____________________________________________   First MD Initiated Contact with Patient 10/11/15 2008     (approximate)  I have reviewed the triage vital signs and the nursing notes.   HISTORY  Chief Complaint Headache; Hypertension; and Blurred Vision   HPI Patricia Reed is a 55 y.o. female with a history of lupus. She complains of several days of elevated blood pressure readings 150/9160 etc. and also a headache which is better if she lays still and worse if she moves her head or gets up and walks around. She recently finished a burst of prednisone for a lupus flare affecting her joints. She also takes an L and methotrexate. She has never had headaches like this before. The headaches were gradual onset and are diffuse throughout her head.   Past Medical History:  Diagnosis Date  . Anemia   . Asthma   . GERD (gastroesophageal reflux disease)   . History of stomach ulcers   . HTN (hypertension)   . Lupus (HCC)   . MI (myocardial infarction) (HCC)    2013  . Rheumatoid arthritis (HCC)   . Thyroid mass   . Vertigo     Patient Active Problem List   Diagnosis Date Noted  . Colon cancer screening 01/25/2014  . Anterior neck pain 12/01/2013  . Rheumatoid arthritis (HCC) 08/18/2012  . Allergic rhinitis 06/23/2012  . HTN (hypertension) 06/23/2012  . High cholesterol 06/23/2012  . Thyroid nodule - benign 06/23/2012  . Mild intermittent asthma   . Sickle cell trait (HCC)   . History of stomach ulcers   . ESOPHAGEAL STRICTURE 10/04/2007  . GERD 10/04/2007  . GASTRITIS, CHRONIC 10/04/2007  . Dysphagia, pharyngoesophageal phase 10/04/2007    Past Surgical History:  Procedure Laterality Date  . ABDOMINAL HYSTERECTOMY     done for menorrhagia, ovaries remain  . CERVICAL DISCECTOMY     plates and screws in neck  . CESAREAN SECTION    . COLONOSCOPY    . ESOPHAGEAL MANOMETRY N/A 02/12/2014   Procedure: ESOPHAGEAL MANOMETRY (EM);  Surgeon: Louis Meckel, MD;  Location: [facility];  Service: Endoscopy;  Laterality: N/A;  . ESOPHAGOGASTRODUODENOSCOPY  03/04/2011   Procedure: ESOPHAGOGASTRODUODENOSCOPY (EGD);  Surgeon: Louis Meckel, MD;  Location: [facility];  Service: Endoscopy;  Laterality: N/A;  BOTOX Injection  . exc benign breast lump    . TONSILLECTOMY    . UPPER GASTROINTESTINAL ENDOSCOPY      Prior to Admission medications   Medication Sig Start Date End Date Taking? Authorizing Provider  albuterol (PROVENTIL HFA;VENTOLIN HFA) 108 (90 BASE) MCG/ACT inhaler Inhale 2 puffs into the lungs every 6 (six) hours as needed for wheezing or shortness of breath.    Historical Provider, MD  folic acid (FOLVITE) 1 MG tablet Take 1 mg by mouth 2 (two) times daily.    Historical Provider, MD  hydroxychloroquine (PLAQUENIL) 200 MG tablet Take 200 mg by mouth 2 (two) times daily.    Historical Provider, MD  levalbuterol [facility] HFA) 45 MCG/ACT inhaler Inhale 1-2 puffs into the lungs every 4 (four) hours as needed for wheezing or shortness of breath.     Historical Provider, MD  Methotrexate, PF, (OTREXUP) 20 MG/0.4ML SOAJ Inject into the skin every 7 (seven) days. Patient is unsure of how many units she takes on Friday s. Patient unknown dose    Historical Provider, MD    Allergies Aspirin; Hydrocodone-acetaminophen; Ibuprofen; Imdur [isosorbide nitrate]; Meperidine hcl; Morphine; Oxycodone-acetaminophen;  Procaine hcl; Toprol xl [metoprolol]; and Tramadol  Family History  Problem Relation Age of Onset  . Alcohol abuse Father   . Hypertension Father   . Heart disease Father   . Colon cancer Maternal Uncle   . Diabetes Maternal Grandmother   . Colon polyps Neg Hx   . Esophageal cancer Neg Hx   . Kidney disease Neg Hx   . Stomach cancer Neg Hx   . Rectal cancer Neg Hx     Social History Social History  Substance Use Topics  . Smoking status: Never Smoker  . Smokeless  tobacco: Never Used  . Alcohol use No    Review of Systems Constitutional: No fever/chills Eyes: No visual changes. ENT: No sore throat. Cardiovascular: Denies chest pain. Respiratory: Denies shortness of breath. Gastrointestinal: No abdominal pain.  No nausea, no vomiting.  No diarrhea.  No constipation. Genitourinary: Negative for dysuria. Musculoskeletal: Negative for back pain. Skin: Negative for rash. Neurological: Negative for focal weakness or numbness.  10-point ROS otherwise negative.  ____________________________________________   PHYSICAL EXAM:  VITAL SIGNS: ED Triage Vitals [10/11/15 1911]  Enc Vitals Group     BP (!) 167/114     Pulse Rate 98     Resp 18     Temp 98.6 F (37 C)     Temp Source Oral     SpO2 100 %     Weight 235 lb (106.6 kg)     Height 5\' 5"  (1.651 m)     Head Circumference      Peak Flow      Pain Score 8     Pain Loc      Pain Edu?      Excl. in GC?    Constitutional: Alert and oriented. Well appearing and in no acute distress. Eyes: Conjunctivae are normal. PERRL. EOMI.Fundi appear to be normal other than maybe a cataract in the right eye Head: Atraumatic. Nose: No congestion/rhinnorhea. Mouth/Throat: Mucous membranes are moist.  Oropharynx non-erythematous. Neck: No stridor. Cardiovascular: Normal rate, regular rhythm. Grossly normal heart sounds.  Good peripheral circulation. Respiratory: Normal respiratory effort.  No retractions. Lungs CTAB. Gastrointestinal: Soft and nontender. No distention. No abdominal bruits. No CVA tenderness. Musculoskeletal: No lower extremity tenderness nor edema.  No joint effusions. Neurologic:  Normal speech and language. No gross focal neurologic deficits are appreciated. Cranial nerves II through XII appear to be intact cerebellar finger-nose rapid alternating movements and hands are normal motor strength is 5 over 5 throughout and there is some pain in her left hip which slightly interferes with  strength testing No gait instability. Skin:  Skin is warm, dry and intact. No rash noted. Psychiatric: Mood and affect are normal. Speech and behavior are normal.  ____________________________________________   LABS (all labs ordered are listed, but only abnormal results are displayed)  Labs Reviewed  CBC - Abnormal; Notable for the following:       Result Value   RDW 15.0 (*)    All other components within normal limits  URINALYSIS COMPLETEWITH MICROSCOPIC ([facility] ONLY) - Abnormal; Notable for the following:    Color, Urine YELLOW (*)    APPearance HAZY (*)    Leukocytes, UA 3+ (*)    Bacteria, UA RARE (*)    Squamous Epithelial / LPF 0-5 (*)    All other components within normal limits  HEPATIC FUNCTION PANEL - Abnormal; Notable for the following:    Bilirubin, Direct <0.1 (*)    All other components within  normal limits  SEDIMENTATION RATE - Abnormal; Notable for the following:    Sed Rate 37 (*)    All other components within normal limits  BASIC METABOLIC PANEL  TROPONIN I  C-REACTIVE PROTEIN   ____________________________________________  EKG  EKG read and interpreted by me shows normal sinus rhythm rate of 90 normal axis somewhat decreased R-wave progression which may be due to lead placement no acute changes ____________________________________________  RADIOLOGY  CT HEAD WITHOUT CONTRAST  TECHNIQUE: Contiguous axial images were obtained from the base of the skull through the vertex without intravenous contrast.  COMPARISON:  12/05/2009  FINDINGS: Brain: No evidence of acute infarction, hemorrhage, hydrocephalus, extra-axial collection or mass lesion/mass effect.  Vascular: No hyperdense vessel or unexpected calcification.  Skull: Normal. Negative for fracture or focal lesion.  Sinuses/Orbits: No acute finding.  Other: No significant change since prior study.  IMPRESSION: No acute intracranial abnormalities.   Electronically Signed    By: Burman Nieves M.D.   On: 10/11/2015 21:15 EXAM: PORTABLE CHEST 1 VIEW  COMPARISON:  01/17/2014  FINDINGS: Lungs are clear.  No pleural effusion or pneumothorax.  The heart is top-normal in size.  Cervical spine fixation hardware.  IMPRESSION: No evidence of acute cardiopulmonary disease.   Electronically Signed   By: Charline Bills M.D.   On: 10/11/2015 21:23 ____________________________________________   PROCEDURES  Procedure(s) performed:   Procedures  Critical Care performed:   ____________________________________________   INITIAL IMPRESSION / ASSESSMENT AND PLAN / ED COURSE  Pertinent labs & imaging results that were available during my care of the patient were reviewed by me and considered in my medical decision making (see chart for details).  Patient feels well as long as she is laying still. Her vision is fine. When she got up she began to get her headache again. I give her some more fluids and some food since she hadn't eaten since noon. After eating she feels much better I'll let her go she'll follow-up with her doctor early this coming week  Clinical Course     ____________________________________________   FINAL CLINICAL IMPRESSION(S) / ED DIAGNOSES  Final diagnoses:  Bad headache  Elevated blood pressure      NEW MEDICATIONS STARTED DURING THIS VISIT:  New Prescriptions   No medications on file     Note:  This document was prepared using Dragon voice recognition software and may include unintentional dictation errors.    Arnaldo Natal, MD 10/11/15 [phone]

## 2015-10-11 NOTE — ED Notes (Signed)
Lab notified to add on Sed rate, Creactive protein, and Hepatic function.

## 2015-10-11 NOTE — ED Notes (Signed)
MD Malinda at bedside. 

## 2015-10-11 NOTE — Discharge Instructions (Signed)
Please return if you're worse or no better tomorrow. Remember to eat regular meals. Please follow-up with your doctor Monday unless sure well in which case I would just follow-up within a week.

## 2015-10-13 LAB — C-REACTIVE PROTEIN: CRP: 0.6 mg/dL (ref <1.0)

## 2015-11-04 ENCOUNTER — Ambulatory Visit (INDEPENDENT_AMBULATORY_CARE_PROVIDER_SITE_OTHER): Payer: BC Managed Care – PPO | Admitting: Family Medicine

## 2015-11-04 ENCOUNTER — Encounter: Payer: Self-pay | Admitting: Family Medicine

## 2015-11-04 VITALS — BP 130/86 | HR 90 | Temp 98.0°F | Ht 65.0 in | Wt 232.0 lb

## 2015-11-04 DIAGNOSIS — M549 Dorsalgia, unspecified: Secondary | ICD-10-CM | POA: Diagnosis not present

## 2015-11-04 DIAGNOSIS — Z23 Encounter for immunization: Secondary | ICD-10-CM

## 2015-11-04 DIAGNOSIS — R109 Unspecified abdominal pain: Secondary | ICD-10-CM

## 2015-11-04 MED ORDER — VALACYCLOVIR HCL 1 G PO TABS: 1000.0000 mg | ORAL_TABLET | Freq: Three times a day (TID) | ORAL | 0 refills | Status: DC

## 2015-11-04 NOTE — Progress Notes (Signed)
Pre visit review using our clinic review tool, if applicable. No additional management support is needed unless otherwise documented below in the visit note. 

## 2015-11-04 NOTE — Progress Notes (Signed)
Dr. Karleen Hampshire T. Desira Alessandrini, MD, CAQ Sports Medicine [facility] Sports [facility] [address] [place], [address] Phone: [phone] Fax: [phone]  11/04/2015  [name], MRN: [id number], DOB: [birth year], 55 y.o.  Primary Physician:  Kerby Nora, MD   Chief Complaint  Patient presents with  . Back Pain    Left Side of Back ?Muscle Spasms   Subjective:   Patricia Reed is a 55 y.o. very pleasant female patient who presents with the following:  Out of the blue - R lower erector spinae  Will hurt with coughing and taking a deep breath.  Hurts with taking a deep breath and hurts really bad Primarily this is entirely on the anterior abdomen wrapping around through the back in the upper region of this. She is having severe pain and pain with touching.  She also has a history of lupus. No active lupus flaring.  Dorsiflexion of foot and having some muscle pain in her leg, too.  Works at an [employer] and then could not really move.  No increase in activity at all - felt like something caught  Slept in the chair for the rest of the night.   Past Medical History, Surgical History, Social History, Family History, Problem List, Medications, and Allergies have been reviewed and updated if relevant.  Patient Active Problem List   Diagnosis Date Noted  . Colon cancer screening 01/25/2014  . Anterior neck pain 12/01/2013  . Rheumatoid arthritis (HCC) 08/18/2012  . Allergic rhinitis 06/23/2012  . HTN (hypertension) 06/23/2012  . High cholesterol 06/23/2012  . Thyroid nodule - benign 06/23/2012  . Mild intermittent asthma   . Sickle cell trait (HCC)   . History of stomach ulcers   . ESOPHAGEAL STRICTURE 10/04/2007  . GERD 10/04/2007  . GASTRITIS, CHRONIC 10/04/2007  . Dysphagia, pharyngoesophageal phase 10/04/2007    Past Medical History:  Diagnosis Date  . Anemia   . Asthma   . GERD (gastroesophageal reflux disease)   . History of stomach  ulcers   . HTN (hypertension)   . Lupus   . MI (myocardial infarction)    2013  . Rheumatoid arthritis (HCC)   . Thyroid mass   . Vertigo     Past Surgical History:  Procedure Laterality Date  . ABDOMINAL HYSTERECTOMY     done for menorrhagia, ovaries remain  . CERVICAL DISCECTOMY     plates and screws in neck  . CESAREAN SECTION    . COLONOSCOPY    . ESOPHAGEAL MANOMETRY N/A 02/12/2014   Procedure: ESOPHAGEAL MANOMETRY (EM);  Surgeon: Louis Meckel, MD;  Location: [facility];  Service: Endoscopy;  Laterality: N/A;  . ESOPHAGOGASTRODUODENOSCOPY  03/04/2011   Procedure: ESOPHAGOGASTRODUODENOSCOPY (EGD);  Surgeon: Louis Meckel, MD;  Location: [facility];  Service: Endoscopy;  Laterality: N/A;  BOTOX Injection  . exc benign breast lump    . TONSILLECTOMY    . UPPER GASTROINTESTINAL ENDOSCOPY      Social History   Social History  . Marital status: Married    Spouse name: N/A  . Number of children: 4  . Years of education: N/A   Occupational History  . TEACHER ASSISTANT [employer]   Social History Main Topics  . Smoking status: Never Smoker  . Smokeless tobacco: Never Used  . Alcohol use No  . Drug use: No  . Sexual activity: Yes    Birth control/ protection: None, Surgical     Comment: LAVH  Other Topics Concern  . Not on file   Social History Narrative  . No narrative on file    Family History  Problem Relation Age of Onset  . Alcohol abuse Father   . Hypertension Father   . Heart disease Father   . Colon cancer Maternal Uncle   . Diabetes Maternal Grandmother   . Colon polyps Neg Hx   . Esophageal cancer Neg Hx   . Kidney disease Neg Hx   . Stomach cancer Neg Hx   . Rectal cancer Neg Hx     Allergies  Allergen Reactions  . Aspirin Other (See Comments)    GI bleed  . Hydrocodone-Acetaminophen Hives and Nausea And Vomiting  . Ibuprofen Other (See Comments)    GI bleed  . Imdur [Isosorbide Nitrate] Other (See Comments)     Reaction unknown  . Meperidine Hcl Hives and Nausea And Vomiting    Short term memory loss  . Morphine Hives and Nausea And Vomiting  . Oxycodone-Acetaminophen Hives and Nausea And Vomiting    Reaction unknown  . Procaine Hcl Other (See Comments)    Ineffective  . Toprol Xl [Metoprolol] Other (See Comments)    Reaction unknown  . Tramadol Itching    Medication list reviewed and updated in full in [facility].  GEN: No fevers, chills. Nontoxic. Primarily MSK c/o today. MSK: Detailed in the HPI GI: tolerating PO intake without difficulty Neuro: No numbness, parasthesias, or tingling associated. Otherwise the pertinent positives of the ROS are noted above.   Objective:   BP 130/86   Pulse 90   Temp 98 F (36.7 C) (Oral)   Ht 5\' 5"  (1.651 m)   Wt 232 lb (105.2 kg)   BMI 38.61 kg/m    GEN: WDWN, NAD, Non-toxic, A & O x 3 HEENT: Atraumatic, Normocephalic. Neck supple. No masses, No LAD. Ears and Nose: No external deformity. CV: RRR, No M/G/R. No JVD. No thrill. No extra heart sounds. PULM: CTA B, no wheezes, crackles, rhonchi. No retractions. No resp. distress. No accessory muscle use. EXTR: No c/c/e NEURO Normal gait.  PSYCH: Normally interactive. Conversant. Not depressed or anxious appearing.  Calm demeanor.    Nontender on the left side of the erector spinae complex. Spinous processes are all nontender. Patient is tender from around L1-L3 and around on the flank into the abdomen and thorax along this pattern. The abdomen is nontender grossly. No distention. Positive bowel sounds. No rebound.  Neurovascularly intact in the lower extremities.  Radiology: Ct Head Wo Contrast  Result Date: 10/11/2015 CLINICAL DATA:  Elevated blood pressure for 2 weeks. Headache since Wednesday. Blurred vision today. Dizziness. EXAM: CT HEAD WITHOUT CONTRAST TECHNIQUE: Contiguous axial images were obtained from the base of the skull through the vertex without intravenous contrast.  COMPARISON:  12/05/2009 FINDINGS: Brain: No evidence of acute infarction, hemorrhage, hydrocephalus, extra-axial collection or mass lesion/mass effect. Vascular: No hyperdense vessel or unexpected calcification. Skull: Normal. Negative for fracture or focal lesion. Sinuses/Orbits: No acute finding. Other: No significant change since prior study. IMPRESSION: No acute intracranial abnormalities. Electronically Signed   By: 12/07/2009 M.D.   On: 10/11/2015 21:15   Dg Chest Portable 1 View  Result Date: 10/11/2015 CLINICAL DATA:  Hypertension x2 weeks, headache EXAM: PORTABLE CHEST 1 VIEW COMPARISON:  01/17/2014 FINDINGS: Lungs are clear.  No pleural effusion or pneumothorax. The heart is top-normal in size. Cervical spine fixation hardware. IMPRESSION: No evidence of acute cardiopulmonary disease. Electronically Signed  By: Charline Bills M.D.   On: 10/11/2015 21:23    Assessment and Plan:   Back pain without radiation  Need for prophylactic vaccination and inoculation against influenza - Plan: Flu Vaccine QUAD 36+ mos IM  Right flank pain  Atypical back pain and flank pain without associated injury. This may be very early shingles given pain level and history and distribution. We will treat this as such with Valtrex.  Continue with simple range of motion and moist heat as well.  Follow-up: No Follow-up on file.  New Prescriptions   VALACYCLOVIR (VALTREX) 1000 MG TABLET    Take 1 tablet (1,000 mg total) by mouth 3 (three) times daily.   Modified Medications   No medications on file   Orders Placed This Encounter  Procedures  . Flu Vaccine QUAD 36+ mos IM    Signed,  Artia Singley T. Eulalio Reamy, MD   Patient's Medications  New Prescriptions   VALACYCLOVIR (VALTREX) 1000 MG TABLET    Take 1 tablet (1,000 mg total) by mouth 3 (three) times daily.  Previous Medications   ALBUTEROL (PROVENTIL HFA;VENTOLIN HFA) 108 (90 BASE) MCG/ACT INHALER    Inhale 2 puffs into the lungs every 6  (six) hours as needed for wheezing or shortness of breath.   D3-50 50000 UNITS CAPSULE    TAKE 1 CAPSULE BY MOUTH TWICE WEEKLY FOR 12 WEEKS   FOLIC ACID (FOLVITE) 1 MG TABLET    Take 1 mg by mouth 2 (two) times daily.   HYDROXYCHLOROQUINE (PLAQUENIL) 200 MG TABLET    Take 200 mg by mouth 2 (two) times daily.   LEVALBUTEROL (XOPENEX HFA) 45 MCG/ACT INHALER    Inhale 1-2 puffs into the lungs every 4 (four) hours as needed for wheezing or shortness of breath.    METHOTREXATE 50 MG/2ML INJECTION      Modified Medications   No medications on file  Discontinued Medications   METHOTREXATE, PF, (OTREXUP) 20 MG/0.4ML SOAJ    Inject into the skin every 7 (seven) days. Patient is unsure of how many units she takes on Friday s. Patient unknown dose

## 2015-11-21 ENCOUNTER — Telehealth (INDEPENDENT_AMBULATORY_CARE_PROVIDER_SITE_OTHER): Payer: Self-pay | Admitting: *Deleted

## 2015-11-21 MED ORDER — PREDNISONE 5 MG PO TABS: ORAL_TABLET | ORAL | 0 refills | Status: DC

## 2015-11-21 NOTE — Telephone Encounter (Signed)
Left message for her to advise has been sent/ call me back if she needs anything.

## 2015-11-21 NOTE — Telephone Encounter (Signed)
Ok to give pred taper as rxd earlier

## 2015-11-21 NOTE — Telephone Encounter (Signed)
Patient called and stated she is having a real bad Flare up. Patient states she is getting over Shingles and she was unable to take her injection. She needs a RX for Prednisone. Patient pharmacy is [facility] on [address] Please advise provider. Thank you

## 2015-11-21 NOTE — Telephone Encounter (Signed)
Patient has shingles was told to hold MTX 2-3 weeks until shingles resolves she has flare , ok to refill taper as prescribed previously (in Aug she had a one week taper 25mg  x1wk,20mg  x1week..., previous to that in April she had a 4 day taper 20mg , 15mg ...)  Please advise

## 2015-11-23 DIAGNOSIS — Z79899 Other long term (current) drug therapy: Secondary | ICD-10-CM | POA: Insufficient documentation

## 2015-11-23 DIAGNOSIS — M359 Systemic involvement of connective tissue, unspecified: Secondary | ICD-10-CM | POA: Insufficient documentation

## 2015-11-25 DIAGNOSIS — E559 Vitamin D deficiency, unspecified: Secondary | ICD-10-CM | POA: Insufficient documentation

## 2015-11-25 DIAGNOSIS — J45909 Unspecified asthma, uncomplicated: Secondary | ICD-10-CM | POA: Insufficient documentation

## 2015-11-25 NOTE — Progress Notes (Signed)
*IMAGE* Office Visit Note  [name]             Date of Birth: 10/21/1960           MRN: [id number]             PCP: Eliezer Lofts, MD Referring: Jinny Sanders, MD Visit Date: 11/26/2015 Occupation: Student at [employer]    Subjective:  Pain and swelling in all the joints.   History of Present Illness: Patricia Reed is a 55 y.o. right-handed female with history of autoimmune disease. She states about a month ago she developed pain in her right side which was in a localized dermatome. She was seen by her primary care physician who prescribed acyclovir. She states she did not develop the rash but had severe pain in that area. After starting acyclovir her pain lasted for about 2 weeks. She had to stop her methotrexate which caused a flare with increased joint pain and joint swelling. She called [place] to get a prednisone taper and she is on the prednisone taper currently her joint pain has improved since she's been on prednisone she resumed her methotrexate last week. She was experiencing joint pain and joint swelling in all of her joints at the time.  Activities of Daily Living:  Patient reports morning stiffness for 45 minutes.   Patient Reports nocturnal pain.  Difficulty dressing/grooming: Denies Difficulty climbing stairs: Reports Difficulty getting out of chair: Reports Difficulty using hands for taps, buttons, cutlery, and/or writing: Reports   Review of Systems  Constitutional: Positive for fatigue. Negative for night sweats, weight gain, weight loss and weakness.  HENT: Negative for mouth sores, trouble swallowing, trouble swallowing, mouth dryness and nose dryness.   Eyes: Negative for pain, redness, visual disturbance and dryness.  Respiratory: Negative for cough, shortness of breath and difficulty breathing.   Cardiovascular: Positive for hypertension. Negative for chest pain, palpitations, irregular heartbeat and swelling in legs/feet.   Gastrointestinal: Negative for blood in stool, constipation and diarrhea.  Endocrine: Negative for increased urination.  Genitourinary: Negative for vaginal dryness.  Musculoskeletal: Positive for arthralgias, joint pain, joint swelling, myalgias, muscle weakness, morning stiffness and myalgias. Negative for muscle tenderness.  Skin: Positive for color change. Negative for rash, hair loss, skin tightness, ulcers and sensitivity to sunlight.  Allergic/Immunologic: Negative for susceptible to infections.  Neurological: Negative for dizziness, memory loss and night sweats.  Hematological: Negative for swollen glands.  Psychiatric/Behavioral: Positive for sleep disturbance. Negative for depressed mood. The patient is not nervous/anxious.     PMFS History:  Patient Active Problem List   Diagnosis Date Noted  . Asthma 11/25/2015  . Vitamin D deficiency 11/25/2015  . Autoimmune disease ([place]) 11/23/2015  . High risk medication use 11/23/2015  . Colon cancer screening 01/25/2014  . Anterior neck pain 12/01/2013  . Inflammatory arthritis 08/18/2012  . Allergic rhinitis 06/23/2012  . HTN (hypertension) 06/23/2012  . High cholesterol 06/23/2012  . Thyroid nodule - benign 06/23/2012  . Mild intermittent asthma   . Sickle cell trait ([place])   . History of stomach ulcers   . ESOPHAGEAL STRICTURE 10/04/2007  . GERD 10/04/2007  . GASTRITIS, CHRONIC 10/04/2007  . Dysphagia, pharyngoesophageal phase 10/04/2007    Past Medical History:  Diagnosis Date  . Anemia   . Asthma   . GERD (gastroesophageal reflux disease)   . History of stomach ulcers   . HTN (hypertension)   . Lupus   . MI (myocardial infarction)  2013  . Rheumatoid arthritis ([place])   . Thyroid mass   . Vertigo     Family History  Problem Relation Age of Onset  . Alcohol abuse Father   . Hypertension Father   . Heart disease Father   . Colon cancer Maternal Uncle   . Diabetes Maternal Grandmother   . Colon polyps Neg Hx    . Esophageal cancer Neg Hx   . Kidney disease Neg Hx   . Stomach cancer Neg Hx   . Rectal cancer Neg Hx    Past Surgical History:  Procedure Laterality Date  . ABDOMINAL HYSTERECTOMY     done for menorrhagia, ovaries remain  . CERVICAL DISCECTOMY     plates and screws in neck  . CESAREAN SECTION    . COLONOSCOPY    . ESOPHAGEAL MANOMETRY N/A 02/12/2014   Procedure: ESOPHAGEAL MANOMETRY (EM);  Surgeon: Inda Castle, MD;  Location: [facility];  Service: Endoscopy;  Laterality: N/A;  . ESOPHAGOGASTRODUODENOSCOPY  03/04/2011   Procedure: ESOPHAGOGASTRODUODENOSCOPY (EGD);  Surgeon: Inda Castle, MD;  Location: [facility];  Service: Endoscopy;  Laterality: N/A;  BOTOX Injection  . exc benign breast lump    . TONSILLECTOMY    . UPPER GASTROINTESTINAL ENDOSCOPY     Social History   Social History Narrative  . No narrative on file     Objective: Vital Signs: BP (!) 162/108 (BP Location: Left Arm, Patient Position: Sitting, Cuff Size: Large)   Pulse 97   Resp 13   Ht 5' 5"  (1.651 m)   Wt 230 lb (104.3 kg)   BMI 38.27 kg/m    Physical Exam  Constitutional: She is oriented to person, place, and time. She appears well-developed and well-nourished.  HENT:  Head: Normocephalic and atraumatic.  Eyes: Conjunctivae and EOM are normal.  Neck: Normal range of motion.  Cardiovascular: Normal rate, regular rhythm, normal heart sounds and intact distal pulses.   Pulmonary/Chest: Effort normal and breath sounds normal.  Abdominal: Soft. Bowel sounds are normal.  Lymphadenopathy:    She has no cervical adenopathy.  Neurological: She is alert and oriented to person, place, and time.  Skin: Skin is warm and dry. Capillary refill takes less than 2 seconds.  Psychiatric: She has a normal mood and affect. Her behavior is normal.  Nursing note and vitals reviewed.    Musculoskeletal Exam: She has good range of motion of her C-spine and thoracic lumbar spine. She has discomfort but  full range of motion of her shoulder joints, good range of motion of her bilateral elbow joints with no swelling, she has extensor T no synovitis on her right wrist. She has synovitis over bilateral second and third MCP joints and third PIP joint. Knee joints ankles MTPs PIPs were good range of motion with no synovitis.  CDAI Exam: No CDAI exam completed.    Investigation: Findings:  08/29/2015 ESR 21, ANA> 1:1280 speckled,ds-,Sm+ , C3 normal C4 normal , UA negative, VitD17, 08/07/2015 CBC normal, CMP normal    Imaging: No results found.  Speciality Comments: No specialty comments available.    Procedures:  No procedures performed Allergies: Aspirin; Hydrocodone-acetaminophen; Ibuprofen; Imdur [isosorbide nitrate]; Meperidine hcl; Morphine; Oxycodone-acetaminophen; Procaine hcl; Toprol xl [metoprolol]; and Tramadol   Assessment / Plan: Visit Diagnoses:  Autoimmune disease- +ANA,+Sm,+RNP,+RF, arthritis. She had recent severe flare off methotrexate for which she required prednisone. She is still on prednisone taper taking 15 mg a day. Her most recent labs show that her ESR and complements  were normal.  Inflammatory arthritis: She still have residual synovitis in her hands especially in her wrists joints in her MCP joints.  High risk medication use: She is on methotrexate 0.8 ML subcutaneously per week and Plaquenil 300 mg a day. Her labs are stable. Her eye exam this year was normal.  Hypertension: Her blood pressure was elevated today. She states her blood pressure has been elevated for the last few months. I've advised her to make an appointment with the primary care physician. In the meantime I'll place her on amlodipine 5 mg by mouth daily. She will monitor her blood pressure at home as she owns one.  Uncomplicated asthma,: Patient denies any flare in the last 1 year.  Vitamin D deficiency : Her vitamin D was still very low she is on vitamin D 50,000 units twice a week. She'll be  finishing up next week. As she is in the office I will check her vitamin D levels today and then call in the future dose based on the levels.   Orders: Orders Placed This Encounter  Procedures  . CBC with Differential/Platelet  . CBC with Differential/Platelet    Standing Status:   Standing    Number of Occurrences:   4    Standing Expiration Date:   11/22/2016  . COMPLETE METABOLIC PANEL WITH GFR  . COMPLETE METABOLIC PANEL WITH GFR    Standing Status:   Standing    Number of Occurrences:   6    Standing Expiration Date:   11/22/2016  . VITAMIN D 25 Hydroxy (Vit-D Deficiency, Fractures)     Face-to-face time spent with patient was 30 minutes. 50% of time was spent in counseling and coordination of care.  Follow-Up Instructions: Return in about 5 months (around 04/25/2016) for Autoimmune disease.   Bo Merino, MD, [facility]

## 2015-11-26 ENCOUNTER — Ambulatory Visit (INDEPENDENT_AMBULATORY_CARE_PROVIDER_SITE_OTHER): Payer: BC Managed Care – PPO | Admitting: Rheumatology

## 2015-11-26 ENCOUNTER — Encounter: Payer: Self-pay | Admitting: Rheumatology

## 2015-11-26 VITALS — BP 166/98 | HR 97 | Resp 13 | Ht 65.0 in | Wt 230.0 lb

## 2015-11-26 DIAGNOSIS — J45909 Unspecified asthma, uncomplicated: Secondary | ICD-10-CM

## 2015-11-26 DIAGNOSIS — M359 Systemic involvement of connective tissue, unspecified: Secondary | ICD-10-CM | POA: Diagnosis not present

## 2015-11-26 DIAGNOSIS — M1990 Unspecified osteoarthritis, unspecified site: Secondary | ICD-10-CM

## 2015-11-26 DIAGNOSIS — Z79899 Other long term (current) drug therapy: Secondary | ICD-10-CM | POA: Diagnosis not present

## 2015-11-26 DIAGNOSIS — I10 Essential (primary) hypertension: Secondary | ICD-10-CM

## 2015-11-26 DIAGNOSIS — E559 Vitamin D deficiency, unspecified: Secondary | ICD-10-CM | POA: Diagnosis not present

## 2015-11-26 MED ORDER — AMLODIPINE BESYLATE 5 MG PO TABS: 5.0000 mg | ORAL_TABLET | Freq: Every day | ORAL | 2 refills | Status: DC

## 2015-11-26 MED ORDER — METHOTREXATE SODIUM CHEMO INJECTION 50 MG/2ML: 20.0000 mg | INTRAMUSCULAR | 0 refills | Status: DC

## 2015-11-26 NOTE — Patient Instructions (Signed)
Standing Labs We placed an order today for your standing lab work.    Please come back and get your standing labs in February and every 3 months  We have open lab Monday through Friday from 8:30-11:30 AM and 1-4 PM at the office of Dr. Omega Durante/Naitik Panwala, PA.   The office is located at [address], [address], [place], [place] [address] No appointment is necessary.   Labs are drawn by [facility].  You may receive a bill from [facility] for your lab work.    

## 2015-11-27 LAB — COMPLETE METABOLIC PANEL WITH GFR
ALT: 12 U/L (ref 6–29)
AST: 16 U/L (ref 10–35)
Albumin: 4.3 g/dL (ref 3.6–5.1)
Alkaline Phosphatase: 53 U/L (ref 33–130)
BUN: 13 mg/dL (ref 7–25)
CALCIUM: 9.3 mg/dL (ref 8.6–10.4)
CHLORIDE: 105 mmol/L (ref 98–110)
CO2: 25 mmol/L (ref 20–31)
CREATININE: 0.63 mg/dL (ref 0.50–1.05)
GFR, Est African American: >89 mL/min (ref >=60)
GFR, Est Non African American: >89 mL/min (ref >=60)
GLUCOSE: 103 mg/dL — AB (ref 65–99)
POTASSIUM: 4 mmol/L (ref 3.5–5.3)
SODIUM: 140 mmol/L (ref 135–146)
Total Bilirubin: 0.8 mg/dL (ref 0.2–1.2)
Total Protein: 7.6 g/dL (ref 6.1–8.1)

## 2015-11-27 LAB — VITAMIN D 25 HYDROXY (VIT D DEFICIENCY, FRACTURES): VIT D 25 HYDROXY: 94 ng/mL (ref 30–100)

## 2015-11-27 LAB — CBC WITH DIFFERENTIAL/PLATELET
BASOS PCT: 1 %
Basophils Absolute: 49 cells/uL (ref 0–200)
EOS PCT: 0 %
Eosinophils Absolute: 0 cells/uL — ABNORMAL LOW (ref 15–500)
HEMATOCRIT: 38.4 % (ref 35.0–45.0)
HEMOGLOBIN: 12.3 g/dL (ref 11.7–15.5)
LYMPHS ABS: 931 {cells}/uL (ref 850–3900)
Lymphocytes Relative: 19 %
MCH: 27.9 pg (ref 27.0–33.0)
MCHC: 32 g/dL (ref 32.0–36.0)
MCV: 87.1 fL (ref 80.0–100.0)
MONO ABS: 196 {cells}/uL — AB (ref 200–950)
MPV: 10.2 fL (ref 7.5–12.5)
Monocytes Relative: 4 %
Neutro Abs: 3724 cells/uL (ref 1500–7800)
Neutrophils Relative %: 76 %
Platelets: 324 10*3/uL (ref 140–400)
RBC: 4.41 MIL/uL (ref 3.80–5.10)
RDW: 15 % (ref 11.0–15.0)
WBC: 4.9 10*3/uL (ref 3.8–10.8)

## 2015-11-27 NOTE — Progress Notes (Signed)
CBC normal, CMP normal, vitamin D higher limits of normal. Please advise patient to stop vitamin D supplement. We can restart over-the-counter vitamin D 1000 units 3 months later.

## 2015-11-28 ENCOUNTER — Telehealth: Payer: Self-pay | Admitting: Radiology

## 2015-11-28 NOTE — Telephone Encounter (Signed)
I have called patient to advise labs are normal / Vit D high end of normal  Left message for her to call me back

## 2015-11-28 NOTE — Telephone Encounter (Signed)
Spoke to patient to advise

## 2015-11-28 NOTE — Telephone Encounter (Signed)
-----   Message from Pollyann Savoy, MD sent at 11/27/2015  8:38 AM EST ----- CBC normal, CMP normal, vitamin D higher limits of normal. Please advise patient to stop vitamin D supplement. We can restart over-the-counter vitamin D 1000 units 3 months later.

## 2015-12-26 ENCOUNTER — Telehealth: Payer: Self-pay | Admitting: *Deleted

## 2015-12-26 ENCOUNTER — Telehealth: Payer: Self-pay | Admitting: Gastroenterology

## 2015-12-26 NOTE — Telephone Encounter (Signed)
Made in error

## 2015-12-26 NOTE — Telephone Encounter (Signed)
Patient states she is having pain in the top of her abdomen. Patient states the pain has her doubled over. Patient states the only thing that has changed is that she has recently started amlodipine which was prescribed by our office. Patient states she has a history of bleeding ulcers and an appointment with gastro on 12/22/15. Patient would like to know what she should do?

## 2015-12-26 NOTE — Telephone Encounter (Signed)
This is not a common side effect of Norvasc. Patient may discontinue Norvasc for right now and she'll schedule an appointment with the PCP or go to the emergency room.

## 2016-01-01 ENCOUNTER — Ambulatory Visit: Payer: BC Managed Care – PPO | Admitting: Physician Assistant

## 2016-01-08 ENCOUNTER — Ambulatory Visit: Payer: BC Managed Care – PPO | Admitting: Physician Assistant

## 2016-02-12 ENCOUNTER — Telehealth: Payer: Self-pay | Admitting: Rheumatology

## 2016-02-12 NOTE — Telephone Encounter (Signed)
Patient states she is having pain all over her body. Patient states she takes tylenol every morning for the discomfort. Patient states she is having inflammation. Patient states she her hands won't close because they are so swollen. Patient would like to know what she can do for the pain.

## 2016-02-12 NOTE — Telephone Encounter (Signed)
Sch appt

## 2016-02-12 NOTE — Telephone Encounter (Signed)
Patient is having a flare up, please call

## 2016-02-12 NOTE — Telephone Encounter (Signed)
Left message to advise patient she needs to schedule an appointment.

## 2016-02-13 ENCOUNTER — Encounter: Payer: Self-pay | Admitting: Rheumatology

## 2016-02-13 ENCOUNTER — Ambulatory Visit (INDEPENDENT_AMBULATORY_CARE_PROVIDER_SITE_OTHER): Payer: BC Managed Care – PPO | Admitting: Rheumatology

## 2016-02-13 VITALS — BP 136/74 | HR 82 | Resp 14 | Ht 65.5 in | Wt 237.0 lb

## 2016-02-13 DIAGNOSIS — M359 Systemic involvement of connective tissue, unspecified: Secondary | ICD-10-CM | POA: Diagnosis not present

## 2016-02-13 DIAGNOSIS — M62838 Other muscle spasm: Secondary | ICD-10-CM | POA: Diagnosis not present

## 2016-02-13 DIAGNOSIS — Z79899 Other long term (current) drug therapy: Secondary | ICD-10-CM

## 2016-02-13 DIAGNOSIS — R5382 Chronic fatigue, unspecified: Secondary | ICD-10-CM | POA: Diagnosis not present

## 2016-02-13 DIAGNOSIS — F5101 Primary insomnia: Secondary | ICD-10-CM | POA: Diagnosis not present

## 2016-02-13 DIAGNOSIS — G4700 Insomnia, unspecified: Secondary | ICD-10-CM | POA: Insufficient documentation

## 2016-02-13 DIAGNOSIS — M797 Fibromyalgia: Secondary | ICD-10-CM | POA: Insufficient documentation

## 2016-02-13 MED ORDER — LIDOCAINE HCL 1 % IJ SOLN
0.5000 mL | INTRAMUSCULAR | Status: AC | PRN
  Administered 2016-02-13: .5 mL

## 2016-02-13 MED ORDER — TRIAMCINOLONE ACETONIDE 40 MG/ML IJ SUSP
10.0000 mg | INTRAMUSCULAR | Status: AC | PRN
  Administered 2016-02-13: 10 mg via INTRAMUSCULAR

## 2016-02-13 MED ORDER — CYCLOBENZAPRINE HCL 10 MG PO TABS: 10.0000 mg | ORAL_TABLET | Freq: Every day | ORAL | 1 refills | Status: DC

## 2016-02-13 MED ORDER — METHOTREXATE SODIUM CHEMO INJECTION 50 MG/2ML: 20.0000 mg | INTRAMUSCULAR | 0 refills | Status: DC

## 2016-02-13 MED ORDER — FOLIC ACID 1 MG PO TABS: 1.0000 mg | ORAL_TABLET | Freq: Two times a day (BID) | ORAL | 4 refills | Status: DC

## 2016-02-13 MED ORDER — HYDROXYCHLOROQUINE SULFATE 200 MG PO TABS: 200.0000 mg | ORAL_TABLET | Freq: Two times a day (BID) | ORAL | 1 refills | Status: DC

## 2016-02-13 MED ORDER — METHOCARBAMOL 500 MG PO TABS: 500.0000 mg | ORAL_TABLET | Freq: Three times a day (TID) | ORAL | 1 refills | Status: DC | PRN

## 2016-02-13 NOTE — Progress Notes (Signed)
Office Visit Note  [name]             Date of Birth: 1960/09/11           MRN: [id number]             PCP: Eliezer Lofts, MD Referring: Jinny Sanders, MD Visit Date: 02/13/2016 Occupation: _0 @    Subjective:  Follow-up Follow-up on autoimmune disease, high risk prescription  History of Present Illness: Patricia Reed is a 56 y.o. female  History of autoimmune disease. No change in symptoms.  Patient has had 3 different episodes in 2017 that she required prednisone tapers. Today's visit, we discussed the type of symptoms the patient was feeling each time she had those tapers. Majority of those visits, patient felt body aches and pain, fatigue. She attributed this to her autoimmune disease flare. She requested a prednisone taper to help her get better.  Patient is under a great deal of stress. 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 She is currently enrolled at [employer] to fulfill these requirements to get her teaching degree. She states that the last time she had a flare in November she knows exactly why she had the flare. She states that she was understood significant stress while she was taking at class. She felt overwhelmed with the material and was on the verge of failing the class. As a result she had to withdraw from the class completely. She recalls that her symptoms improved significantly once she withdrew from the class.  She gives a history of having trouble sleeping. She cannot fall asleep or stay asleep. She recalls that sometimes she wakes up at 2:00 in the morning and can't fall asleep until 4:00 in the morning She has pain to various areas of her body. She states that when her first graders try to how her she feels significant pain. She is also a grandmother, and she has an autistic grandchild. She recalls that every time he saw her,  she feels a great deal of pain.  Patient states that she feels significant fatigue all the time. She states that while she is having the conversation with [place] and giving [place] the history today, she was fantacizing that  as soon as she is completes our office visit, she can go home and sleep.  Patient takes methotrexate and Plaquenil as prescribed for the treatment of her autoimmune disease. She has no flare of her autoimmune disease overall. She tolerates those medications well. She does need a refill on these medicines    Activities of Daily Living:  Patient reports morning stiffness for 30 minutes.   Patient Reports nocturnal pain.  Difficulty dressing/grooming: Reports Difficulty climbing stairs: Reports Difficulty getting out of chair: Reports Difficulty using hands for taps, buttons, cutlery, and/or writing: Reports   Review of Systems  Constitutional: Positive for fatigue.  HENT: Negative for mouth sores and mouth dryness.   Eyes: Negative for dryness.  Respiratory: Negative for shortness of breath.   Gastrointestinal: Negative for constipation and diarrhea.  Musculoskeletal: Positive for myalgias and myalgias.  Skin: Negative for sensitivity to sunlight.  Psychiatric/Behavioral: Positive for sleep disturbance. Negative for decreased concentration.    PMFS History:  Patient Active Problem List   Diagnosis Date Noted  . Fibromyalgia 02/13/2016  . 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 Chronic fatigue 02/13/2016  . Asthma 11/25/2015  . Vitamin D deficiency  11/25/2015  . Autoimmune disease ([place]) 11/23/2015  . High risk medication use 11/23/2015  . Colon cancer screening 01/25/2014  . Anterior neck pain 12/01/2013  . Inflammatory arthritis 08/18/2012  . Allergic rhinitis 06/23/2012  . HTN (hypertension) 06/23/2012  . High cholesterol 06/23/2012  . Thyroid nodule - benign 06/23/2012  . Mild intermittent asthma   . Sickle cell trait ([place])   . History of stomach ulcers   .  ESOPHAGEAL STRICTURE 10/04/2007  . GERD 10/04/2007  . GASTRITIS, CHRONIC 10/04/2007  . Dysphagia, pharyngoesophageal phase 10/04/2007    Past Medical History:  Diagnosis Date  . Anemia   . Asthma   . GERD (gastroesophageal reflux disease)   . History of stomach ulcers   . HTN (hypertension)   . Lupus   . MI (myocardial infarction)    2013  . Rheumatoid arthritis ([place])   . Thyroid mass   . Vertigo     Family History  Problem Relation Age of Onset  . Alcohol abuse Father   . Hypertension Father   . Heart disease Father   . Colon cancer Maternal Uncle   . Diabetes Maternal Grandmother   . Colon polyps Neg Hx   . Esophageal cancer Neg Hx   . Kidney disease Neg Hx   . Stomach cancer Neg Hx   . Rectal cancer Neg Hx    Past Surgical History:  Procedure Laterality Date  . ABDOMINAL HYSTERECTOMY     done for menorrhagia, ovaries remain  . CERVICAL DISCECTOMY     plates and screws in neck  . CESAREAN SECTION    . COLONOSCOPY    . ESOPHAGEAL MANOMETRY N/A 02/12/2014   Procedure: ESOPHAGEAL MANOMETRY (EM);  Surgeon: Inda Castle, MD;  Location: [facility];  Service: Endoscopy;  Laterality: N/A;  . ESOPHAGOGASTRODUODENOSCOPY  03/04/2011   Procedure: ESOPHAGOGASTRODUODENOSCOPY (EGD);  Surgeon: Inda Castle, MD;  Location: [facility];  Service: Endoscopy;  Laterality: N/A;  BOTOX Injection  . exc benign breast lump    . TONSILLECTOMY    . UPPER GASTROINTESTINAL ENDOSCOPY     Social History   Social History Narrative  . No narrative on file     Objective: Vital Signs: BP 136/74   Pulse 82   Resp 14   Ht 5' 5.5" (1.664 m)   Wt 237 lb (107.5 kg)   BMI 38.84 kg/m    Physical Exam  Constitutional: She is oriented to person, place, and time. She appears well-developed and well-nourished.  HENT:  Head: Normocephalic and atraumatic.  Eyes: EOM are normal. Pupils are equal, round, and reactive to light.  Cardiovascular: Normal rate, regular rhythm and normal heart  sounds.  Exam reveals no gallop and no friction rub.   No murmur heard. Pulmonary/Chest: Effort normal and breath sounds normal. She has no wheezes. She has no rales.  Abdominal: Soft. Bowel sounds are normal. She exhibits no distension. There is no tenderness. There is no guarding. No hernia.  Musculoskeletal: Normal range of motion. She exhibits no edema, tenderness or deformity.  Lymphadenopathy:    She has no cervical adenopathy.  Neurological: She is alert and oriented to person, place, and time. Coordination normal.  Skin: Skin is warm and dry. Capillary refill takes less than 2 seconds. No rash noted.  Psychiatric: She has a normal mood and affect. Her behavior is normal.     Musculoskeletal Exam:  Full range of motion of all joints Grip strength is equal and strong bilaterally Fiber myalgia tender  points are all absent  CDAI Exam: CDAI Homunculus Exam:   Joint Counts:  CDAI Tender Joint count: 0 CDAI Swollen Joint count: 0  Global Assessments:  Patient Global Assessment: 10 Provider Global Assessment: 10  CDAI Calculated Score: 20  No synovitis on examination. She does have overall pain which she rates as 10 out of 10 but that's from fibromyalgia.  Investigation: No additional findings.  Office Visit on 11/26/2015  Component Date Value Ref Range Status  . WBC 11/26/2015 4.9  3.8 - 10.8 K/uL Final  . RBC 11/26/2015 4.41  3.80 - 5.10 MIL/uL Final  . Hemoglobin 11/26/2015 12.3  11.7 - 15.5 g/dL Final  . HCT 11/26/2015 38.4  35.0 - 45.0 % Final  . MCV 11/26/2015 87.1  80.0 - 100.0 fL Final  . MCH 11/26/2015 27.9  27.0 - 33.0 pg Final  . MCHC 11/26/2015 32.0  32.0 - 36.0 g/dL Final  . RDW 11/26/2015 15.0  11.0 - 15.0 % Final  . Platelets 11/26/2015 324  140 - 400 K/uL Final  . MPV 11/26/2015 10.2  7.5 - 12.5 fL Final  . Neutro Abs 11/26/2015 3724  1,500 - 7,800 cells/uL Final  . Lymphs Abs 11/26/2015 931  850 - 3,900 cells/uL Final  . Monocytes Absolute 11/26/2015  196* 200 - 950 cells/uL Final  . Eosinophils Absolute 11/26/2015 0* 15 - 500 cells/uL Final  . Basophils Absolute 11/26/2015 49  0 - 200 cells/uL Final  . Neutrophils Relative % 11/26/2015 76  % Final  . Lymphocytes Relative 11/26/2015 19  % Final  . Monocytes Relative 11/26/2015 4  % Final  . Eosinophils Relative 11/26/2015 0  % Final  . Basophils Relative 11/26/2015 1  % Final  . Smear Review 11/26/2015 Criteria for review not met   Final  . Sodium 11/26/2015 140  135 - 146 mmol/L Final  . Potassium 11/26/2015 4.0  3.5 - 5.3 mmol/L Final  . Chloride 11/26/2015 105  98 - 110 mmol/L Final  . CO2 11/26/2015 25  20 - 31 mmol/L Final  . Glucose, Bld 11/26/2015 103* 65 - 99 mg/dL Final  . BUN 11/26/2015 13  7 - 25 mg/dL Final  . Creat 11/26/2015 0.63  0.50 - 1.05 mg/dL Final   Comment:   For patients > or = 54 years of age: The upper reference limit for Creatinine is approximately 13% higher for people identified as African-American.     . Total Bilirubin 11/26/2015 0.8  0.2 - 1.2 mg/dL Final  . Alkaline Phosphatase 11/26/2015 53  33 - 130 U/L Final  . AST 11/26/2015 16  10 - 35 U/L Final  . ALT 11/26/2015 12  6 - 29 U/L Final  . Total Protein 11/26/2015 7.6  6.1 - 8.1 g/dL Final  . Albumin 11/26/2015 4.3  3.6 - 5.1 g/dL Final  . Calcium 11/26/2015 9.3  8.6 - 10.4 mg/dL Final  . GFR, Est African American 11/26/2015 >89  >=60 mL/min Final  . GFR, Est Non African American 11/26/2015 >89  >=60 mL/min Final  . Vit D, 25-Hydroxy 11/26/2015 94  30 - 100 ng/mL Final   Comment: Vitamin D Status           25-OH Vitamin D        Deficiency                <20 ng/mL        Insufficiency         20 - 29 ng/mL  Optimal             > or = 30 ng/mL   For 25-OH Vitamin D testing on patients on D2-supplementation and patients for whom quantitation of D2 and D3 fractions is required, the QuestAssureD 25-OH VIT D, (D2,D3), LC/MS/MS is recommended: order code [id number] (patients > 2 yrs).     Admission on 10/11/2015, Discharged on 10/11/2015  Component Date Value Ref Range Status  . Sodium 10/11/2015 140  135 - 145 mmol/L Final  . Potassium 10/11/2015 3.9  3.5 - 5.1 mmol/L Final  . Chloride 10/11/2015 104  101 - 111 mmol/L Final  . CO2 10/11/2015 30  22 - 32 mmol/L Final  . Glucose, Bld 10/11/2015 96  65 - 99 mg/dL Final  . BUN 10/11/2015 17  6 - 20 mg/dL Final  . Creatinine, Ser 10/11/2015 0.70  0.44 - 1.00 mg/dL Final  . Calcium 10/11/2015 9.2  8.9 - 10.3 mg/dL Final  . GFR calc non Af Amer 10/11/2015 >60  >60 mL/min Final  . GFR calc Af Amer 10/11/2015 >60  >60 mL/min Final   Comment: (NOTE) The eGFR has been calculated using the CKD EPI equation. This calculation has not been validated in all clinical situations. eGFR's persistently <60 mL/min signify possible Chronic Kidney Disease.   . Anion gap 10/11/2015 6  5 - 15 Final  . WBC 10/11/2015 4.5  3.6 - 11.0 K/uL Final  . RBC 10/11/2015 4.45  3.80 - 5.20 MIL/uL Final  . Hemoglobin 10/11/2015 12.8  12.0 - 16.0 g/dL Final  . HCT 10/11/2015 39.1  35.0 - 47.0 % Final  . MCV 10/11/2015 87.9  80.0 - 100.0 fL Final  . MCH 10/11/2015 28.7  26.0 - 34.0 pg Final  . MCHC 10/11/2015 32.7  32.0 - 36.0 g/dL Final  . RDW 10/11/2015 15.0* 11.5 - 14.5 % Final  . Platelets 10/11/2015 292  150 - 440 K/uL Final  . Troponin I 10/11/2015 <0.03  <0.03 ng/mL Final  . Color, Urine 10/11/2015 YELLOW* YELLOW Final  . APPearance 10/11/2015 HAZY* CLEAR Final  . Glucose, UA 10/11/2015 NEGATIVE  NEGATIVE mg/dL Final  . Bilirubin Urine 10/11/2015 NEGATIVE  NEGATIVE Final  . Ketones, ur 10/11/2015 NEGATIVE  NEGATIVE mg/dL Final  . Specific Gravity, Urine 10/11/2015 1.027  1.005 - 1.030 Final  . Hgb urine dipstick 10/11/2015 NEGATIVE  NEGATIVE Final  . pH 10/11/2015 5.0  5.0 - 8.0 Final  . Protein, ur 10/11/2015 NEGATIVE  NEGATIVE mg/dL Final  . Nitrite 10/11/2015 NEGATIVE  NEGATIVE Final  . Leukocytes, UA 10/11/2015 3+* NEGATIVE Final  . RBC  / HPF 10/11/2015 0-5  0 - 5 RBC/hpf Final  . WBC, UA 10/11/2015 TOO NUMEROUS TO COUNT  0 - 5 WBC/hpf Final  . Bacteria, UA 10/11/2015 RARE* NONE SEEN Final  . Squamous Epithelial / LPF 10/11/2015 0-5* NONE SEEN Final  . Mucous 10/11/2015 PRESENT   Final  . CRP 10/11/2015 0.6  <1.0 mg/dL Final  . Total Protein 10/11/2015 8.0  6.5 - 8.1 g/dL Final  . Albumin 10/11/2015 4.1  3.5 - 5.0 g/dL Final  . AST 10/11/2015 18  15 - 41 U/L Final  . ALT 10/11/2015 15  14 - 54 U/L Final  . Alkaline Phosphatase 10/11/2015 56  38 - 126 U/L Final  . Total Bilirubin 10/11/2015 0.8  0.3 - 1.2 mg/dL Final  . Bilirubin, Direct 10/11/2015 <0.1* 0.1 - 0.5 mg/dL Final  . Indirect Bilirubin 10/11/2015 NOT CALCULATED  0.3 -  0.9 mg/dL Final  . Sed Rate 10/11/2015 37* 0 - 30 mm/hr Final     Imaging: No results found.  Speciality Comments: No specialty comments available.    Procedures:  Trigger Point Inj Date/Time: 02/13/2016 3:38 PM Performed by: Eliezer Lofts Authorized by: Eliezer Lofts   Consent Given by:  Patient Site marked: the procedure site was marked   Timeout: prior to procedure the correct patient, procedure, and site was verified   Indications:  Muscle spasm and pain Total # of Trigger Points:  2 Location: neck   Needle Size:  27 G Approach:  Dorsal Medications #1:  0.5 mL lidocaine 1 %; 10 mg triamcinolone acetonide 40 MG/ML Medications #2:  0.5 mL lidocaine 1 %; 10 mg triamcinolone acetonide 40 MG/ML Patient tolerance:  Patient tolerated the procedure well with no immediate complications Comments: Patient tolerated procedure well. There are no complications. She had pain that was rated 10 on a scale of 0-10 with pain going to the occipital area bilateral shoulder area as well as her jaw and her throat. She states that it was hard to swallow. The injection, her pain went down to a 1 on a scale of 0-10.    Allergies: Aspirin; Hydrocodone-acetaminophen; Ibuprofen; Imdur [isosorbide  nitrate]; Meperidine hcl; Morphine; Oxycodone-acetaminophen; Procaine hcl; Toprol xl [metoprolol]; and Tramadol   Assessment / Plan:     Visit Diagnoses: Autoimmune disease ([place])  High risk medication use  Fibromyalgia - 02/13/2016:==> New diagnosis of fibromyalgia. Patient had symptoms for significant amount of time i.e. greater than 6 months but was unable to verbalize them to  Primary insomnia  Chronic fatigue  Trapezius muscle spasm   Plan: #1: Patient autoimmune disease is well-controlled at this time. No synovitis on examination. No joint pain stiffness and swelling. No new rash. No oral or nasal ulcers.  #2: High risk prescription. Continue methotrexate 0.8 ML's every week Folic acid 1 mg every day Plaquenil 200 mg twice a day Update Plaquenil eye exam is scheduled this month Refill above medications for 90 day supply. Okay to give her refill on Plaquenil.  #3: Bilateral trapezius muscle spasms. Bilateral trapezius muscle spasms were injected with 10 mg of Kenalog mixed with 1/2 mL percent lidocaine without epinephrine.  Patient tolerated procedure well. There are no comp occasions Patient's pain was 10 on a scale of 0-10 prior to the injection. 5 minutes after the injection , patient's pain was 0-1 on a scale of 0-10 after the injection.  #4: New diagnosis of fibromyalgia. Based on patient's symptoms, evaluation of the patient revealed symptoms consistent with fibromyalgia. She had 18 out of 18 tender points. We discussed strategies to control fibromyalgia.  #5: Prescription for Robaxin 500 mg; 1 by mouth 3 times a day when necessary muscle spasms; ninety-day supply with refill If using Robaxin at nighttime, do not use Flexeril. If using Flexeril at night to help with sleep, do not use Robaxin at night.  #6: Prescription for Flexeril 10 mg. 1 by mouth daily at bedtime when necessary muscle spasms.; Ninety-day supply with 1 refill. Do not use concurrently with  Robaxin.  #7: Drowsiness precautions were discussed at length with the patient regarding Flexeril  #8: Return to clinic in 5 months.  #9: Patient was requested to continue exercise to manage fibromyalgia, weight loss, advised proper sleep hygiene.  #10: If in 3 months, patient has trapezius muscle pain, I offered the patient lidocaine injections without cortisone if blood pressure is within normal limits and if pain  is severe.  #11: This patient was co-evaluated with Dr. Estanislado Pandy concurs with above.  #12: CBC with differential, CMP with GFR, vitamin D 25 OH, thyroid panel. Since patient has since the fibromyalgia, we need to do these labs to rule out other sources of patient's fatigue  Orders: Orders Placed This Encounter  Procedures  . Trigger Point Injection   Meds ordered this encounter  Medications  . methotrexate 50 MG/2ML injection    Sig: Inject 0.8 mLs (20 mg total) into the skin once a week.    Dispense:  10 mL    Refill:  0    Dispense methotrexate with preservative. Give to last for 90 days.    Order Specific Question:   Supervising Provider    Answer:   Lyda Perone  . hydroxychloroquine (PLAQUENIL) 200 MG tablet    Sig: Take 1 tablet (200 mg total) by mouth 2 (two) times daily.    Dispense:  180 tablet    Refill:  1    Order Specific Question:   Supervising Provider    Answer:   Bo Merino [[id number]]  . folic acid (FOLVITE) 1 MG tablet    Sig: Take 1 tablet (1 mg total) by mouth 2 (two) times daily.    Dispense:  180 tablet    Refill:  4    Order Specific Question:   Supervising Provider    Answer:   Bo Merino [[id number]]  . methocarbamol (ROBAXIN) 500 MG tablet    Sig: Take 1 tablet (500 mg total) by mouth every 8 (eight) hours as needed for muscle spasms.    Dispense:  270 tablet    Refill:  1    Do not use Robaxin at night if using Flexeril at night    Order Specific Question:   Supervising Provider    Answer:   Bo Merino [[id number]]   . cyclobenzaprine (FLEXERIL) 10 MG tablet    Sig: Take 1 tablet (10 mg total) by mouth at bedtime.    Dispense:  90 tablet    Refill:  1    Order Specific Question:   Supervising Provider    Answer:   Bo Merino [phone]    Face-to-face time spent with patient was 50 minutes. 50% of time was spent in counseling and coordination of care.  Follow-Up Instructions: Return in about 5 months (around 07/13/2016) for a.d,mtx 34m,folic 128m plq [id number], fatigue, insom, bil trap.   NaEliezer LoftsPA-C  Note - This record has been created using [employer] Chart creation errors have been sought, but may not always  have been located. Such creation errors do not reflect on  the standard of medical care.

## 2016-02-20 ENCOUNTER — Telehealth: Payer: Self-pay | Admitting: *Deleted

## 2016-02-20 NOTE — Telephone Encounter (Signed)
Attempted to contact the patient and left message for patient to call the office.  

## 2016-02-20 NOTE — Telephone Encounter (Signed)
-----   Message from Tawni Pummel, [place] sent at 02/13/2016  7:30 PM EST ----- Regarding: needs labs to be updated  Asked patient to get the following labs CBC with differential, CMP with GFR, vitamin D 25 OH, thyroid panel,

## 2016-02-21 NOTE — Telephone Encounter (Signed)
Patient advised she needs to update her labs. Patient to come to the office this month.

## 2016-02-25 ENCOUNTER — Other Ambulatory Visit: Payer: Self-pay | Admitting: *Deleted

## 2016-02-25 DIAGNOSIS — R5381 Other malaise: Secondary | ICD-10-CM

## 2016-02-25 DIAGNOSIS — E559 Vitamin D deficiency, unspecified: Secondary | ICD-10-CM

## 2016-02-25 DIAGNOSIS — R5383 Other fatigue: Secondary | ICD-10-CM

## 2016-02-25 DIAGNOSIS — Z79899 Other long term (current) drug therapy: Secondary | ICD-10-CM

## 2016-02-25 LAB — CBC WITH DIFFERENTIAL/PLATELET
BASOS PCT: 1 %
Basophils Absolute: 51 cells/uL (ref 0–200)
EOS ABS: 51 {cells}/uL (ref 15–500)
Eosinophils Relative: 1 %
HEMATOCRIT: 40.2 % (ref 35.0–45.0)
Hemoglobin: 12.9 g/dL (ref 11.7–15.5)
LYMPHS ABS: 1071 {cells}/uL (ref 850–3900)
Lymphocytes Relative: 21 %
MCH: 28.2 pg (ref 27.0–33.0)
MCHC: 32.1 g/dL (ref 32.0–36.0)
MCV: 87.8 fL (ref 80.0–100.0)
MONO ABS: 357 {cells}/uL (ref 200–950)
MONOS PCT: 7 %
MPV: 9.9 fL (ref 7.5–12.5)
NEUTROS ABS: 3570 {cells}/uL (ref 1500–7800)
Neutrophils Relative %: 70 %
PLATELETS: 295 10*3/uL (ref 140–400)
RBC: 4.58 MIL/uL (ref 3.80–5.10)
RDW: 15.5 % — ABNORMAL HIGH (ref 11.0–15.0)
WBC: 5.1 10*3/uL (ref 3.8–10.8)

## 2016-02-25 LAB — COMPLETE METABOLIC PANEL WITH GFR
ALT: 9 U/L (ref 6–29)
AST: 14 U/L (ref 10–35)
Albumin: 4.2 g/dL (ref 3.6–5.1)
Alkaline Phosphatase: 59 U/L (ref 33–130)
BILIRUBIN TOTAL: 0.7 mg/dL (ref 0.2–1.2)
BUN: 17 mg/dL (ref 7–25)
CO2: 27 mmol/L (ref 20–31)
CREATININE: 0.89 mg/dL (ref 0.50–1.05)
Calcium: 9.2 mg/dL (ref 8.6–10.4)
Chloride: 105 mmol/L (ref 98–110)
GFR, EST AFRICAN AMERICAN: 86 mL/min (ref >=60)
GFR, Est Non African American: 74 mL/min (ref >=60)
Glucose, Bld: 87 mg/dL (ref 65–99)
Potassium: 4.3 mmol/L (ref 3.5–5.3)
Sodium: 143 mmol/L (ref 135–146)
TOTAL PROTEIN: 7.3 g/dL (ref 6.1–8.1)

## 2016-02-25 LAB — THYROID PANEL WITH TSH
FREE THYROXINE INDEX: 3 (ref 1.4–3.8)
T3 UPTAKE: 26 % (ref 22–35)
T4, Total: 11.4 ug/dL (ref 4.5–12.0)
TSH: 0.38 mIU/L — ABNORMAL LOW

## 2016-02-26 LAB — VITAMIN D 25 HYDROXY (VIT D DEFICIENCY, FRACTURES): VIT D 25 HYDROXY: 58 ng/mL (ref 30–100)

## 2016-03-02 ENCOUNTER — Telehealth (INDEPENDENT_AMBULATORY_CARE_PROVIDER_SITE_OTHER): Payer: Self-pay | Admitting: Rheumatology

## 2016-03-02 ENCOUNTER — Telehealth: Payer: Self-pay | Admitting: Rheumatology

## 2016-03-02 NOTE — Telephone Encounter (Signed)
Patient called requesting the results of her blood work.  XF#[phone].  Thank you

## 2016-03-02 NOTE — Telephone Encounter (Signed)
Patient advised of lab results and verbalized understanding.  

## 2016-04-10 ENCOUNTER — Other Ambulatory Visit: Payer: Self-pay | Admitting: Rheumatology

## 2016-04-10 MED ORDER — METHOTREXATE SODIUM CHEMO INJECTION 50 MG/2ML: 20.0000 mg | INTRAMUSCULAR | 0 refills | Status: DC

## 2016-04-10 MED ORDER — FOLIC ACID 1 MG PO TABS: 1.0000 mg | ORAL_TABLET | Freq: Two times a day (BID) | ORAL | 4 refills | Status: AC

## 2016-04-10 MED ORDER — HYDROXYCHLOROQUINE SULFATE 200 MG PO TABS: 200.0000 mg | ORAL_TABLET | Freq: Two times a day (BID) | ORAL | 1 refills | Status: AC

## 2016-04-10 NOTE — Telephone Encounter (Signed)
Patient states she lost her printed prescriptions she was given at her last appointment. She is requesting new prescriptions to be sent to the pharmacy.   Last Visit: 02/13/16 Next Visit: 04/28/16 Labs: 02/25/16 WNL  Okay to refill MTX, Folic Acid, PLQ?

## 2016-04-10 NOTE — Telephone Encounter (Signed)
Patient calling because she has lost her Rx that you gave her at her last visit.  She states the scripts were printed out and given to her.  She uses [facility].  She states she needs all of them filled PLQ, folic acid, MTX.

## 2016-04-16 ENCOUNTER — Encounter: Payer: Self-pay | Admitting: Family Medicine

## 2016-04-16 ENCOUNTER — Ambulatory Visit (INDEPENDENT_AMBULATORY_CARE_PROVIDER_SITE_OTHER): Payer: BC Managed Care – PPO | Admitting: Family Medicine

## 2016-04-16 VITALS — BP 124/88 | HR 67 | Temp 98.1°F | Ht 66.0 in | Wt 217.0 lb

## 2016-04-16 DIAGNOSIS — R0789 Other chest pain: Secondary | ICD-10-CM

## 2016-04-16 DIAGNOSIS — F418 Other specified anxiety disorders: Secondary | ICD-10-CM | POA: Diagnosis not present

## 2016-04-16 NOTE — Progress Notes (Signed)
Subjective:    Patient ID: Patricia Reed, female    DOB: 1960/01/24, 56 y.o.   MRN: [id number]  HPI  56 year old female with history of, HTN presents for multiple concerns.  She would like a referral to cardiology. In past saw Dr. Antoine Poche.  She has been noting  twinge of pain pr cramp in left face, central chest, radiates down left arm.  Occurs at rest and with exertion. Seems worse when she is upset.  Last 15 minutes.. Goes away with time. She is currently having pain. No SOB, no sweating. She has been more tired lately.  no heart racing, no irregular rate.  She has been more anxious lately in last month given she has to take Praxis test in next month.  Feels anxious and shaky at times.    Last saw Dr. Antoine Poche 3 years ago.  2013  Heart cath: nml coronaries  2016 endoscopy with Dr. Arlyce Dice: nml. Not currently on PPI.  BP Readings from Last 3 Encounters:  04/16/16 124/88  02/13/16 136/74  11/26/15 (!) 166/98       Blood pressure 124/88, pulse 67, temperature 98.1 F (36.7 C), temperature source Oral, height 5\' 6"  (1.676 m), weight 217 lb (98.4 kg), SpO2 98 %.  Review of Systems  Constitutional: Positive for fatigue. Negative for fever.  HENT: Negative for congestion and ear pain.   Eyes: Negative for pain.  Respiratory: Positive for chest tightness and shortness of breath. Negative for cough.   Cardiovascular: Positive for chest pain. Negative for palpitations and leg swelling.  Gastrointestinal: Negative for abdominal pain.  Genitourinary: Negative for dysuria and vaginal bleeding.  Musculoskeletal: Negative for back pain.  Neurological: Negative for syncope, light-headedness and headaches.  Psychiatric/Behavioral: Negative for dysphoric mood.       Objective:   Physical Exam  Constitutional: Vital signs are normal. She appears well-developed and well-nourished. She is cooperative.  Non-toxic appearance. She does not appear ill. No distress.  HENT:    Head: Normocephalic.  Right Ear: Hearing, tympanic membrane, external ear and ear canal normal. Tympanic membrane is not erythematous, not retracted and not bulging.  Left Ear: Hearing, tympanic membrane, external ear and ear canal normal. Tympanic membrane is not erythematous, not retracted and not bulging.  Nose: No mucosal edema or rhinorrhea. Right sinus exhibits no maxillary sinus tenderness and no frontal sinus tenderness. Left sinus exhibits no maxillary sinus tenderness and no frontal sinus tenderness.  Mouth/Throat: Uvula is midline, oropharynx is clear and moist and mucous membranes are normal.  Eyes: Conjunctivae, EOM and lids are normal. Pupils are equal, round, and reactive to light. Lids are everted and swept, no foreign bodies found.  Neck: Trachea normal and normal range of motion. Neck supple. Carotid bruit is not present. No thyroid mass and no thyromegaly present.  Cardiovascular: Normal rate, regular rhythm, S1 normal, S2 normal, normal heart sounds, intact distal pulses and normal pulses.  Exam reveals no gallop and no friction rub.   No murmur heard. Pulmonary/Chest: Effort normal and breath sounds normal. No tachypnea. No respiratory distress. She has no decreased breath sounds. She has no wheezes. She has no rhonchi. She has no rales.  Abdominal: Soft. Normal appearance and bowel sounds are normal. There is no tenderness.  Neurological: She is alert.  Skin: Skin is warm, dry and intact. No rash noted.  Psychiatric: Her speech is normal and behavior is normal. Judgment and thought content normal. Her mood appears not anxious. Cognition and memory are  normal. She does not exhibit a depressed mood.          Assessment & Plan:

## 2016-04-16 NOTE — Patient Instructions (Signed)
Work on health lifestyle, rest, stress reduction.  Call if mood is not improving or if you want a referral for counselor.

## 2016-04-16 NOTE — Progress Notes (Signed)
Pre visit review using our clinic review tool, if applicable. No additional management support is needed unless otherwise documented below in the visit note. 

## 2016-04-23 DIAGNOSIS — M62838 Other muscle spasm: Secondary | ICD-10-CM | POA: Insufficient documentation

## 2016-04-23 NOTE — Progress Notes (Signed)
Office Visit Note  [name]             Date of Birth: [birth year]           MRN: [id number]             PCP: Eliezer Lofts, MD Referring: Jinny Sanders, MD Visit Date: 04/28/2016 Occupation: @GUAROCC @    Subjective:  Joint Pain and Muscle Pain   History of Present Illness: [name] is a 56 y.o. female  New diagnosis of [place] diagnosed Feb 13, 2016.  Patient is doing well with her autoimmune disease overall. No oral or nasal ulcers. Some fatigue. She has occasional Raynaud's from time to time with cold exposure.  Taking methotrexate 0.8 ML's week and folic acid 2 pills every day. Taking Plaquenil 200 mg twice a day.  On the last visit, patient had a new diagnosis of fibromyalgia. She is flaring at the moment. Specifically, her bilateral trapezius muscles are spasming.  I gave her cortisone injection last time and that helped her significantly. Unfortunately it's too early to give her the full dose so of offered her lidocaine only and she is agreeable. She states that her pain as a 10 on a scale of 0-10. Her last labs were February 2018 and she is not due for labs again until May 2018. She has plans to get them done.  She also knows to get Plaquenil eye exam done annually.    Activities of Daily Living:  Patient reports morning stiffness for 15 minutes.   Patient Reports nocturnal pain.  Difficulty dressing/grooming: Denies Difficulty climbing stairs: Denies Difficulty getting out of chair: Denies Difficulty using hands for taps, buttons, cutlery, and/or writing: Reports   Review of Systems  Constitutional: Positive for fatigue.  HENT: Negative for mouth sores and mouth dryness.   Eyes: Negative for dryness.  Respiratory: Negative for shortness of breath.   Gastrointestinal: Negative for constipation and diarrhea.  Musculoskeletal: Positive for myalgias and myalgias.  Skin: Negative for sensitivity to sunlight.  Psychiatric/Behavioral:  Positive for sleep disturbance. Negative for decreased concentration.    PMFS History:  Patient Active Problem List   Diagnosis Date Noted  . Trapezius muscle spasm 04/23/2016  . Fibromyalgia 02/13/2016  . 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 Chronic fatigue 02/13/2016  . Asthma 11/25/2015  . Vitamin D deficiency 11/25/2015  . Autoimmune disease ([place]) 11/23/2015  . High risk medication use 11/23/2015  . Colon cancer screening 01/25/2014  . Anterior neck pain 12/01/2013  . Inflammatory arthritis 08/18/2012  . Allergic rhinitis 06/23/2012  . HTN (hypertension) 06/23/2012  . High cholesterol 06/23/2012  . Thyroid nodule - benign 06/23/2012  . Mild intermittent asthma   . Sickle cell trait ([place])   . History of stomach ulcers   . ESOPHAGEAL STRICTURE 10/04/2007  . GERD 10/04/2007  . GASTRITIS, CHRONIC 10/04/2007  . Dysphagia, pharyngoesophageal phase 10/04/2007    Past Medical History:  Diagnosis Date  . Anemia   . Asthma   . GERD (gastroesophageal reflux disease)   . History of stomach ulcers   . HTN (hypertension)   . Lupus   . MI (myocardial infarction)    2013  . Rheumatoid arthritis ([place])   . Thyroid mass   . Vertigo     Family History  Problem Relation Age of Onset  . Alcohol abuse Father   . Hypertension Father   . Heart disease Father   . Colon cancer Maternal Uncle   . Diabetes Maternal  Grandmother   . Colon polyps Neg Hx   . Esophageal cancer Neg Hx   . Kidney disease Neg Hx   . Stomach cancer Neg Hx   . Rectal cancer Neg Hx    Past Surgical History:  Procedure Laterality Date  . ABDOMINAL HYSTERECTOMY     done for menorrhagia, ovaries remain  . CERVICAL DISCECTOMY     plates and screws in neck  . CESAREAN SECTION    . COLONOSCOPY    . ESOPHAGEAL MANOMETRY N/A 02/12/2014   Procedure: ESOPHAGEAL MANOMETRY (EM);  Surgeon: Inda Castle, MD;  Location: [facility];  Service: Endoscopy;  Laterality: N/A;  . ESOPHAGOGASTRODUODENOSCOPY  03/04/2011    Procedure: ESOPHAGOGASTRODUODENOSCOPY (EGD);  Surgeon: Inda Castle, MD;  Location: [facility];  Service: Endoscopy;  Laterality: N/A;  BOTOX Injection  . exc benign breast lump    . TONSILLECTOMY    . UPPER GASTROINTESTINAL ENDOSCOPY     Social History   Social History Narrative  . No narrative on file     Objective: Vital Signs: BP (!) 140/98   Pulse 82   Resp 16   Wt 217 lb (98.4 kg)   BMI 35.02 kg/m    Physical Exam  Constitutional: She is oriented to person, place, and time. She appears well-developed and well-nourished.  HENT:  Head: Normocephalic and atraumatic.  Eyes: EOM are normal. Pupils are equal, round, and reactive to light.  Cardiovascular: Normal rate, regular rhythm and normal heart sounds.  Exam reveals no gallop and no friction rub.   No murmur heard. Pulmonary/Chest: Effort normal and breath sounds normal. She has no wheezes. She has no rales.  Abdominal: Soft. Bowel sounds are normal. She exhibits no distension. There is no tenderness. There is no guarding. No hernia.  Musculoskeletal: Normal range of motion. She exhibits no edema, tenderness or deformity.  Lymphadenopathy:    She has no cervical adenopathy.  Neurological: She is alert and oriented to person, place, and time. Coordination normal.  Skin: Skin is warm and dry. Capillary refill takes less than 2 seconds. No rash noted.  Psychiatric: She has a normal mood and affect. Her behavior is normal.  Nursing note and vitals reviewed.    Musculoskeletal Exam:    CDAI Exam: CDAI Homunculus Exam:   Joint Counts:  CDAI Tender Joint count: 0 CDAI Swollen Joint count: 0  Global Assessments:  Patient Global Assessment: 10 Provider Global Assessment: 10  CDAI Calculated Score: 20    Investigation: Findings:  Update Plaquenil eye exam is scheduled this month  Orders Only on 02/25/2016  Component Date Value Ref Range Status  . WBC 02/25/2016 5.1  3.8 - 10.8 K/uL Final  . RBC  02/25/2016 4.58  3.80 - 5.10 MIL/uL Final  . Hemoglobin 02/25/2016 12.9  11.7 - 15.5 g/dL Final  . HCT 02/25/2016 40.2  35.0 - 45.0 % Final  . MCV 02/25/2016 87.8  80.0 - 100.0 fL Final  . MCH 02/25/2016 28.2  27.0 - 33.0 pg Final  . MCHC 02/25/2016 32.1  32.0 - 36.0 g/dL Final  . RDW 02/25/2016 15.5* 11.0 - 15.0 % Final  . Platelets 02/25/2016 295  140 - 400 K/uL Final  . MPV 02/25/2016 9.9  7.5 - 12.5 fL Final  . Neutro Abs 02/25/2016 3570  1,500 - 7,800 cells/uL Final  . Lymphs Abs 02/25/2016 1071  850 - 3,900 cells/uL Final  . Monocytes Absolute 02/25/2016 357  200 - 950 cells/uL Final  . Eosinophils Absolute  02/25/2016 51  15 - 500 cells/uL Final  . Basophils Absolute 02/25/2016 51  0 - 200 cells/uL Final  . Neutrophils Relative % 02/25/2016 70  % Final  . Lymphocytes Relative 02/25/2016 21  % Final  . Monocytes Relative 02/25/2016 7  % Final  . Eosinophils Relative 02/25/2016 1  % Final  . Basophils Relative 02/25/2016 1  % Final  . Smear Review 02/25/2016 Criteria for review not met   Final  . Sodium 02/25/2016 143  135 - 146 mmol/L Final  . Potassium 02/25/2016 4.3  3.5 - 5.3 mmol/L Final  . Chloride 02/25/2016 105  98 - 110 mmol/L Final  . CO2 02/25/2016 27  20 - 31 mmol/L Final  . Glucose, Bld 02/25/2016 87  65 - 99 mg/dL Final  . BUN 02/25/2016 17  7 - 25 mg/dL Final  . Creat 02/25/2016 0.89  0.50 - 1.05 mg/dL Final   Comment:   For patients > or = 54 years of age: The upper reference limit for Creatinine is approximately 13% higher for people identified as African-American.     . Total Bilirubin 02/25/2016 0.7  0.2 - 1.2 mg/dL Final  . Alkaline Phosphatase 02/25/2016 59  33 - 130 U/L Final  . AST 02/25/2016 14  10 - 35 U/L Final  . ALT 02/25/2016 9  6 - 29 U/L Final  . Total Protein 02/25/2016 7.3  6.1 - 8.1 g/dL Final  . Albumin 02/25/2016 4.2  3.6 - 5.1 g/dL Final  . Calcium 02/25/2016 9.2  8.6 - 10.4 mg/dL Final  . GFR, Est African American 02/25/2016 86  >=60  mL/min Final  . GFR, Est Non African American 02/25/2016 74  >=60 mL/min Final  . Vit D, 25-Hydroxy 02/25/2016 58  30 - 100 ng/mL Final   Comment: Vitamin D Status           25-OH Vitamin D        Deficiency                <20 ng/mL        Insufficiency         20 - 29 ng/mL        Optimal             > or = 30 ng/mL   For 25-OH Vitamin D testing on patients on D2-supplementation and patients for whom quantitation of D2 and D3 fractions is required, the QuestAssureD 25-OH VIT D, (D2,D3), LC/MS/MS is recommended: order code [id number] (patients > 2 yrs).   . T4, Total 02/25/2016 11.4  4.5 - 12.0 ug/dL Final  . T3 Uptake 02/25/2016 26  22 - 35 % Final  . Free Thyroxine Index 02/25/2016 3.0  1.4 - 3.8 Final  . TSH 02/25/2016 0.38* mIU/L Final   Comment:   Reference Range   > or = 20 Years  0.40-4.50   Pregnancy Range First trimester  0.26-2.66 Second trimester 0.55-2.73 Third trimester  0.43-2.91     Office Visit on 11/26/2015  Component Date Value Ref Range Status  . WBC 11/26/2015 4.9  3.8 - 10.8 K/uL Final  . RBC 11/26/2015 4.41  3.80 - 5.10 MIL/uL Final  . Hemoglobin 11/26/2015 12.3  11.7 - 15.5 g/dL Final  . HCT 11/26/2015 38.4  35.0 - 45.0 % Final  . MCV 11/26/2015 87.1  80.0 - 100.0 fL Final  . MCH 11/26/2015 27.9  27.0 - 33.0 pg Final  . MCHC 11/26/2015 32.0  32.0 -  36.0 g/dL Final  . RDW 11/26/2015 15.0  11.0 - 15.0 % Final  . Platelets 11/26/2015 324  140 - 400 K/uL Final  . MPV 11/26/2015 10.2  7.5 - 12.5 fL Final  . Neutro Abs 11/26/2015 3724  1,500 - 7,800 cells/uL Final  . Lymphs Abs 11/26/2015 931  850 - 3,900 cells/uL Final  . Monocytes Absolute 11/26/2015 196* 200 - 950 cells/uL Final  . Eosinophils Absolute 11/26/2015 0* 15 - 500 cells/uL Final  . Basophils Absolute 11/26/2015 49  0 - 200 cells/uL Final  . Neutrophils Relative % 11/26/2015 76  % Final  . Lymphocytes Relative 11/26/2015 19  % Final  . Monocytes Relative 11/26/2015 4  % Final  . Eosinophils  Relative 11/26/2015 0  % Final  . Basophils Relative 11/26/2015 1  % Final  . Smear Review 11/26/2015 Criteria for review not met   Final  . Sodium 11/26/2015 140  135 - 146 mmol/L Final  . Potassium 11/26/2015 4.0  3.5 - 5.3 mmol/L Final  . Chloride 11/26/2015 105  98 - 110 mmol/L Final  . CO2 11/26/2015 25  20 - 31 mmol/L Final  . Glucose, Bld 11/26/2015 103* 65 - 99 mg/dL Final  . BUN 11/26/2015 13  7 - 25 mg/dL Final  . Creat 11/26/2015 0.63  0.50 - 1.05 mg/dL Final   Comment:   For patients > or = 54 years of age: The upper reference limit for Creatinine is approximately 13% higher for people identified as African-American.     . Total Bilirubin 11/26/2015 0.8  0.2 - 1.2 mg/dL Final  . Alkaline Phosphatase 11/26/2015 53  33 - 130 U/L Final  . AST 11/26/2015 16  10 - 35 U/L Final  . ALT 11/26/2015 12  6 - 29 U/L Final  . Total Protein 11/26/2015 7.6  6.1 - 8.1 g/dL Final  . Albumin 11/26/2015 4.3  3.6 - 5.1 g/dL Final  . Calcium 11/26/2015 9.3  8.6 - 10.4 mg/dL Final  . GFR, Est African American 11/26/2015 >89  >=60 mL/min Final  . GFR, Est Non African American 11/26/2015 >89  >=60 mL/min Final  . Vit D, 25-Hydroxy 11/26/2015 94  30 - 100 ng/mL Final   Comment: Vitamin D Status           25-OH Vitamin D        Deficiency                <20 ng/mL        Insufficiency         20 - 29 ng/mL        Optimal             > or = 30 ng/mL   For 25-OH Vitamin D testing on patients on D2-supplementation and patients for whom quantitation of D2 and D3 fractions is required, the QuestAssureD 25-OH VIT D, (D2,D3), LC/MS/MS is recommended: order code [id number] (patients > 2 yrs).       Imaging: No results found.  Speciality Comments: No specialty comments available.    Procedures:  Trigger Point Inj Date/Time: 04/28/2016 9:01 AM Performed by: Eliezer Lofts Authorized by: Eliezer Lofts   Consent Given by:  Patient Site marked: the procedure site was marked   Timeout: prior  to procedure the correct patient, procedure, and site was verified   Indications:  Muscle spasm and pain Total # of Trigger Points:  2 Location: neck   Needle Size:  27 G Approach:  Dorsal Medications #1:  0.3 mL lidocaine 1 % Medications #2:  0.3 mL lidocaine 1 % Patient tolerance:  Patient tolerated the procedure well with no immediate complications Comments: Patient rates her pain as 10 on a scale of 0-10. We'll give her lidocaine only injection today. 2 minutes after the injection, patient's pain is rated at 5 on a scale of 0-10.   Allergies: Aspirin; Hydrocodone-acetaminophen; Ibuprofen; Imdur [isosorbide nitrate]; Meperidine hcl; Morphine; Oxycodone-acetaminophen; Procaine hcl; Toprol xl [metoprolol]; and Tramadol   Assessment / Plan:     Visit Diagnoses: Autoimmune disease ([place])  High risk medication use - methotrexate 0.8 ML's every weekPlaquenil 248m twice a day  Fibromyalgia - 02/13/2016:==> New diagnosis of fibromyalgia. Patient had symptoms for significant amount of time i.e. greater than 6 months but was unable to verbalize them to  Primary insomnia  Chronic fatigue  Trapezius muscle spasm   Plan: #1: Autoimmune disease. No oral or nasal ulcers. No new rash. Has occasional Raynaud's off and on. During cold exposure. Some fatigue.  #2: High risk prescription. Methotrexate 0.8 ML every week and folic acid 2 mg every day Plaquenil 200 mg twice a day Lasix Plaquenil was 04/30/2015. Patient gave patient Plaquenil eye exam form so she can have it done promptly.  #3: Fibromyalgia syndrome. Active disease with Joyce pain and positive tender points. I've advised the patient to do water aerobics and other exercises  #4: Bilateral trapezius muscle spasms. Patient rates her discomfort as a 10 on a scale of 0-10. See procedure note for full details. Give her lidocaine only injection and patient responded well.  #5: Return to clinic in 5 months  #6: Labs are due  again in May 2018 (she will need CBC with differential and CMP with GFR in May 2018) Last labs were from 04/07/2016 were within normal limits  #7: No med refills needed at this time.  #8: Note the patient's blood pressure today is 140/89. I advised the patient did importance of getting that blood pressure under better control. She states that she saw her PCP recently and they've been giving her medicines for controlling her blood pressure but she did not tolerate them well. She also states that her blood pressure was in a better range recently when she went to her doctor's office this past week. I wanted to confirm the patient's blood pressure remains proper range and is not elevated like it is today she will address it with her PCP as appropriate.    Orders: No orders of the defined types were placed in this encounter.  No orders of the defined types were placed in this encounter.   Face-to-face time spent with patient was 30 minutes. 50% of time was spent in counseling and coordination of care.  Follow-Up Instructions: No Follow-up on file.   NEliezer Lofts PA-C  Note - This record has been created using [employer]  Chart creation errors have been sought, but may not always  have been located. Such creation errors do not reflect on  the standard of medical care.

## 2016-04-28 ENCOUNTER — Encounter: Payer: Self-pay | Admitting: Rheumatology

## 2016-04-28 ENCOUNTER — Ambulatory Visit (INDEPENDENT_AMBULATORY_CARE_PROVIDER_SITE_OTHER): Payer: BC Managed Care – PPO | Admitting: Rheumatology

## 2016-04-28 VITALS — BP 140/98 | HR 82 | Resp 16 | Wt 217.0 lb

## 2016-04-28 DIAGNOSIS — M359 Systemic involvement of connective tissue, unspecified: Secondary | ICD-10-CM | POA: Diagnosis not present

## 2016-04-28 DIAGNOSIS — R5382 Chronic fatigue, unspecified: Secondary | ICD-10-CM

## 2016-04-28 DIAGNOSIS — M797 Fibromyalgia: Secondary | ICD-10-CM | POA: Diagnosis not present

## 2016-04-28 DIAGNOSIS — Z79899 Other long term (current) drug therapy: Secondary | ICD-10-CM

## 2016-04-28 DIAGNOSIS — F5101 Primary insomnia: Secondary | ICD-10-CM | POA: Diagnosis not present

## 2016-04-28 DIAGNOSIS — M62838 Other muscle spasm: Secondary | ICD-10-CM | POA: Diagnosis not present

## 2016-04-28 MED ORDER — LIDOCAINE HCL 1 % IJ SOLN
0.3000 mL | INTRAMUSCULAR | Status: AC | PRN
Start: 2016-04-28 — End: 2016-04-28
  Administered 2016-04-28: .3 mL

## 2016-04-28 MED ORDER — LIDOCAINE HCL 1 % IJ SOLN
0.3000 mL | INTRAMUSCULAR | Status: AC | PRN
  Administered 2016-04-28: .3 mL

## 2016-04-29 ENCOUNTER — Telehealth: Payer: Self-pay | Admitting: Rheumatology

## 2016-04-29 NOTE — Telephone Encounter (Signed)
Patient was seen by Mr. Patricia Reed yesterday.  He treated her for Fibro, but did not give her anything for the joint pain.  She states that she is in a lot of pain and needs something for the pain.  CB#[phone].  Thank you

## 2016-04-29 NOTE — Telephone Encounter (Signed)
I call patient and discussed with her  the symptoms that she is having.She states that she is hurting so that she would have a hard time getting out of bed this morning.She is having a significant fibromyalgia flare.I gave her trapezius muscle injections the other day and they've helped her some but she still hurting all over.She has autoimmune disease which I do not feel is flaring.I've advised the patient to do arthritis strength Tylenol 2 pills twice a day. And given some extra time to read cover from the fiber flare.She is agreeable and she knows to call us back if she has any new symptoms that show up.

## 2016-05-11 ENCOUNTER — Telehealth: Payer: Self-pay | Admitting: Family Medicine

## 2016-05-11 NOTE — Telephone Encounter (Signed)
Pt wonders if fibromyalgia might be causing BP to go up. Last BP today 129/105. Pt said her BP is usually normal. Pt does not remember how long it has been since took BP med.pt said has been at least 5 - 6 months. Pt does not remember the name of a BP med and is not sure who prescribed. Do not see BP med on current med list. No CP, SOB,H/A or dizziness; then pt said she has slight h/a because has not eaten lunch. Pt does not want to go to UC due to cost.pt will be gone from home all this week. Pt already has appt with Dr Ermalene Searing on 05/21/16. Pt said if condition worsened or BP remained high she will go to UC otherwise she will see Dr Ermalene Searing on 05/21/16. FYI to Dr Ermalene Searing.

## 2016-05-11 NOTE — Telephone Encounter (Signed)
Patient Name: Patricia Reed  DOB: 06-Sep-1960    Initial Comment Caller states her blood pressure is running high and they have told her to call her doctor and inform him. Her blood pressure is now 121/82. Last night her blood pressure it was higher. She has lupus. She drink medication for it so it raises her blood pressure and she is in pain so that makes it go up now. She wants to know what she should do.    Nurse Assessment  Nurse: Stefano Gaul, RN, Dwana Curd Date/Time (Eastern Time): 05/11/2016 12:13:05 PM  Confirm and document reason for call. If symptomatic, describe symptoms. ---Caller states her orthopedic doctor said her BP has been running high. She takes methotrexate. She is in pain most of the time. She takes prednisone periodically for severe pain. She is active. Bp 131/118 last night. BP 121/82 while laying down. BP 169/105.  Does the patient have any new or worsening symptoms? ---Yes  Will a triage be completed? ---Yes  Related visit to physician within the last 2 weeks? ---No  Does the PT have any chronic conditions? (i.e. diabetes, asthma, etc.) ---Yes  List chronic conditions. ---lupus; MI  Is the patient pregnant or possibly pregnant? (Ask all females between the ages of 26-55) ---No  Is this a behavioral health or substance abuse call? ---No     Guidelines    Guideline Title Affirmed Question Affirmed Notes  High Blood Pressure Systolic BP >= 160 OR Diastolic >= 100   High Blood Pressure Systolic BP >= 160 OR Diastolic >= 100    Final Disposition User   See PCP When Office is Open (within 3 days) Stefano Gaul, Charity fundraiser, Dwana Curd    Comments  Pt is out of town in [place] until Saturday. Wants to know if BP medication can be called into [facility] [address] [place], [place]; phone number: [phone]. Please call pt back regarding medication   Referrals  GO TO FACILITY REFUSED   Disagree/Comply: Disagree  Disagree/Comply Reason: Disagree with instructions  Disagree/Comply: Disagree   Disagree/Comply Reason: Disagree with instructions

## 2016-05-12 NOTE — Telephone Encounter (Signed)
Noted  

## 2016-05-21 ENCOUNTER — Ambulatory Visit: Payer: BC Managed Care – PPO | Admitting: Family Medicine

## 2016-05-26 ENCOUNTER — Other Ambulatory Visit: Payer: Self-pay

## 2016-05-26 DIAGNOSIS — Z79899 Other long term (current) drug therapy: Secondary | ICD-10-CM

## 2016-05-26 LAB — COMPLETE METABOLIC PANEL WITH GFR
ALT: 11 U/L (ref 6–29)
AST: 16 U/L (ref 10–35)
Albumin: 4.3 g/dL (ref 3.6–5.1)
Alkaline Phosphatase: 57 U/L (ref 33–130)
BUN: 13 mg/dL (ref 7–25)
CHLORIDE: 108 mmol/L (ref 98–110)
CO2: 25 mmol/L (ref 20–31)
CREATININE: 0.7 mg/dL (ref 0.50–1.05)
Calcium: 8.9 mg/dL (ref 8.6–10.4)
GFR, Est African American: >89 mL/min (ref >=60)
GFR, Est Non African American: >89 mL/min (ref >=60)
GLUCOSE: 101 mg/dL — AB (ref 65–99)
POTASSIUM: 3.7 mmol/L (ref 3.5–5.3)
SODIUM: 145 mmol/L (ref 135–146)
Total Bilirubin: 0.6 mg/dL (ref 0.2–1.2)
Total Protein: 7.4 g/dL (ref 6.1–8.1)

## 2016-05-26 LAB — CBC WITH DIFFERENTIAL/PLATELET
BASOS PCT: 1 %
Basophils Absolute: 58 cells/uL (ref 0–200)
Eosinophils Absolute: 0 cells/uL — ABNORMAL LOW (ref 15–500)
Eosinophils Relative: 0 %
HEMATOCRIT: 39.2 % (ref 35.0–45.0)
Hemoglobin: 12.3 g/dL (ref 11.7–15.5)
LYMPHS ABS: 986 {cells}/uL (ref 850–3900)
LYMPHS PCT: 17 %
MCH: 27.6 pg (ref 27.0–33.0)
MCHC: 31.4 g/dL — AB (ref 32.0–36.0)
MCV: 87.9 fL (ref 80.0–100.0)
MONO ABS: 232 {cells}/uL (ref 200–950)
MPV: 10.3 fL (ref 7.5–12.5)
Monocytes Relative: 4 %
Neutro Abs: 4524 cells/uL (ref 1500–7800)
Neutrophils Relative %: 78 %
Platelets: 292 10*3/uL (ref 140–400)
RBC: 4.46 MIL/uL (ref 3.80–5.10)
RDW: 14.7 % (ref 11.0–15.0)
WBC: 5.8 10*3/uL (ref 3.8–10.8)

## 2016-05-26 NOTE — Progress Notes (Signed)
WNL

## 2016-06-08 DIAGNOSIS — F418 Other specified anxiety disorders: Secondary | ICD-10-CM | POA: Insufficient documentation

## 2016-06-08 DIAGNOSIS — R0789 Other chest pain: Secondary | ICD-10-CM | POA: Insufficient documentation

## 2016-06-08 NOTE — Assessment & Plan Note (Addendum)
EKG today stable. Symptoms not clearly cardiac. Most likely de to anxiety.  Last saw Dr. Antoine Poche 3 years ago.  2013  Heart cath: nml coronaries  Will refer back to cards per pt request for consideration of need for further eval.

## 2016-06-08 NOTE — Assessment & Plan Note (Signed)
Associated with test taking and upcoming practice.  Reviewed good study habits and stress reduction.

## 2016-06-23 ENCOUNTER — Telehealth: Payer: Self-pay | Admitting: Rheumatology

## 2016-06-23 NOTE — Telephone Encounter (Signed)
Patient states she she is having trouble moving. Patient states she is having swelling hands and wrists. Patient is also having swelling in her ankles and feet. Patient states she has felt like this for the past 2 days. Patient states she has been trying tylenol for the pain and it is not helping. Patient is on MTX 0.8 mL weekly and PLQ BID. Patient states she is taking them as prescribed. Patient is requesting a prescription for prednisone.

## 2016-06-23 NOTE — Telephone Encounter (Signed)
Patient called requesting that a prescription be called in for Prednisone.  I checked her chart but did not see this medication listed.  [facility] [address]  CB#[phone].  Thank you.

## 2016-06-23 NOTE — Telephone Encounter (Signed)
Ok to give prednisone taper as follows:  4po qAM x 4 days;3po qAM x 4 days;2po qAM x 4 days;1po qAM x 4 days;1/2po qAM x 4 days; then stop.;disp 42 pills w/ no refills.;

## 2016-06-24 MED ORDER — PREDNISONE 5 MG PO TABS: ORAL_TABLET | ORAL | 0 refills | Status: DC

## 2016-06-24 NOTE — Telephone Encounter (Signed)
Left message to advise patient prescription for prednisone has been called to the pharmacy.

## 2016-07-01 ENCOUNTER — Encounter: Payer: Self-pay | Admitting: Emergency Medicine

## 2016-07-01 ENCOUNTER — Emergency Department
Admission: EM | Admit: 2016-07-01 | Discharge: 2016-07-01 | Disposition: A | Discharge status: Home / Self Care | Payer: BC Managed Care – PPO | Attending: Emergency Medicine | Admitting: Emergency Medicine

## 2016-07-01 ENCOUNTER — Emergency Department: Payer: BC Managed Care – PPO

## 2016-07-01 DIAGNOSIS — J45909 Unspecified asthma, uncomplicated: Secondary | ICD-10-CM | POA: Insufficient documentation

## 2016-07-01 DIAGNOSIS — I10 Essential (primary) hypertension: Secondary | ICD-10-CM | POA: Insufficient documentation

## 2016-07-01 DIAGNOSIS — R079 Chest pain, unspecified: Secondary | ICD-10-CM | POA: Diagnosis present

## 2016-07-01 DIAGNOSIS — I309 Acute pericarditis, unspecified: Secondary | ICD-10-CM | POA: Insufficient documentation

## 2016-07-01 DIAGNOSIS — I252 Old myocardial infarction: Secondary | ICD-10-CM | POA: Diagnosis not present

## 2016-07-01 LAB — CBC
HEMATOCRIT: 37 % (ref 35.0–47.0)
Hemoglobin: 11.9 g/dL — ABNORMAL LOW (ref 12.0–16.0)
MCH: 28.1 pg (ref 26.0–34.0)
MCHC: 32.1 g/dL (ref 32.0–36.0)
MCV: 87.4 fL (ref 80.0–100.0)
Platelets: 307 10*3/uL (ref 150–440)
RBC: 4.23 MIL/uL (ref 3.80–5.20)
RDW: 15 % — AB (ref 11.5–14.5)
WBC: 5.4 10*3/uL (ref 3.6–11.0)

## 2016-07-01 LAB — TROPONIN I: Troponin I: <0.03 ng/mL (ref <0.03)

## 2016-07-01 LAB — BASIC METABOLIC PANEL
Anion gap: 8 (ref 5–15)
BUN: 15 mg/dL (ref 6–20)
CHLORIDE: 105 mmol/L (ref 101–111)
CO2: 26 mmol/L (ref 22–32)
Calcium: 9.1 mg/dL (ref 8.9–10.3)
Creatinine, Ser: 0.55 mg/dL (ref 0.44–1.00)
GFR calc Af Amer: >60 mL/min (ref >60)
GFR calc non Af Amer: >60 mL/min (ref >60)
GLUCOSE: 91 mg/dL (ref 65–99)
POTASSIUM: 3.9 mmol/L (ref 3.5–5.1)
SODIUM: 139 mmol/L (ref 135–145)

## 2016-07-01 LAB — FIBRIN DERIVATIVES D-DIMER (ARMC ONLY): Fibrin derivatives D-dimer (ARMC): 490.41 (ref 0.00–499.00)

## 2016-07-01 MED ORDER — COLCHICINE 0.6 MG PO TABS: 0.6000 mg | ORAL_TABLET | Freq: Two times a day (BID) | ORAL | 0 refills | Status: DC

## 2016-07-01 MED ORDER — GI COCKTAIL ~~LOC~~
30.0000 mL | Freq: Once | ORAL | Status: AC
  Administered 2016-07-01: 30 mL via ORAL

## 2016-07-01 NOTE — ED Triage Notes (Addendum)
Pt reports chest pain x 2 weeks. Reports pain starts on the left side radiating through the back.  Pt reports discomfort to back as 5/10. Denies any cold sxs. Pt reports pain when lying on her left side. Has hx of MI and LUPUS

## 2016-07-01 NOTE — ED Notes (Signed)

## 2016-07-01 NOTE — ED Provider Notes (Signed)
[facility] Emergency Department Provider Note   ____________________________________________   I have reviewed the triage vital signs and the nursing notes.   HISTORY  Chief Complaint Chest Pain   History limited by: Not Limited   HPI Patricia Reed is a 56 y.o. female who presents to the emergency department today because of concerns for chest pain. Is located in the left side of her chest. Patient states that the pain is been going on for roughly 2 weeks. It has been fairly constant. It is progressively been getting worse. It is sharp. It radiates to her back. The patient states it is worse with lying flat and deep breaths. She denies any cough. No nausea or vomiting.   Past Medical History:  Diagnosis Date  . Anemia   . Asthma   . GERD (gastroesophageal reflux disease)   . History of stomach ulcers   . HTN (hypertension)   . Lupus   . MI (myocardial infarction) (HCC)    2013  . Rheumatoid arthritis (HCC)   . Thyroid mass   . Vertigo     Patient Active Problem List   Diagnosis Date Noted  . Situational anxiety 06/08/2016  . Atypical chest pain 06/08/2016  . Trapezius muscle spasm 04/23/2016  . Fibromyalgia 02/13/2016  . 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 Chronic fatigue 02/13/2016  . Asthma 11/25/2015  . Vitamin D deficiency 11/25/2015  . Autoimmune disease (HCC) 11/23/2015  . High risk medication use 11/23/2015  . Colon cancer screening 01/25/2014  . Anterior neck pain 12/01/2013  . Inflammatory arthritis 08/18/2012  . Allergic rhinitis 06/23/2012  . HTN (hypertension) 06/23/2012  . High cholesterol 06/23/2012  . Thyroid nodule - benign 06/23/2012  . Mild intermittent asthma   . Sickle cell trait (HCC)   . History of stomach ulcers   . ESOPHAGEAL STRICTURE 10/04/2007  . GERD 10/04/2007  . GASTRITIS, CHRONIC 10/04/2007  . Dysphagia, pharyngoesophageal phase 10/04/2007    Past Surgical History:  Procedure Laterality Date  .  ABDOMINAL HYSTERECTOMY     done for menorrhagia, ovaries remain  . CERVICAL DISCECTOMY     plates and screws in neck  . CESAREAN SECTION    . COLONOSCOPY    . ESOPHAGEAL MANOMETRY N/A 02/12/2014   Procedure: ESOPHAGEAL MANOMETRY (EM);  Surgeon: Louis Meckel, MD;  Location: [facility] END[facility];  Service: Endoscopy;  Laterality: N/A;  . ESOPHAGOGASTRODUODENOSCOPY  03/04/2011   Procedure: ESOPHAGOGASTRODUODENOSCOPY (EGD);  Surgeon: Louis Meckel, MD;  Location: [facility];  Service: Endoscopy;  Laterality: N/A;  BOTOX Injection  . exc benign breast lump    . TONSILLECTOMY    . UPPER GASTROINTESTINAL ENDOSCOPY      Prior to Admission medications   Medication Sig Start Date End Date Taking? Authorizing Provider  albuterol (PROVENTIL HFA;VENTOLIN HFA) 108 (90 BASE) MCG/ACT inhaler Inhale 2 puffs into the lungs every 6 (six) hours as needed for wheezing or shortness of breath.    [provider]  cyclobenzaprine (FLEXERIL) 10 MG tablet Take 1 tablet (10 mg total) by mouth at bedtime. Patient not taking: Reported on 04/28/2016 02/13/16 08/11/16  Tawni Pummel, PA-C  folic acid (FOLVITE) 1 MG tablet Take 1 tablet (1 mg total) by mouth 2 (two) times daily. 04/10/16 07/04/17  Panwala, Naitik, PA-C  hydroxychloroquine (PLAQUENIL) 200 MG tablet Take 1 tablet (200 mg total) by mouth 2 (two) times daily. 04/10/16 10/07/16  Panwala, Naitik, PA-C  levalbuterol (XOPENEX HFA) 45 MCG/ACT inhaler Inhale 1-2 puffs into the lungs  every 4 (four) hours as needed for wheezing or shortness of breath.     [provider]  methocarbamol (ROBAXIN) 500 MG tablet Take 1 tablet (500 mg total) by mouth every 8 (eight) hours as needed for muscle spasms. Patient not taking: Reported on 04/28/2016 02/13/16 08/11/16  Tawni Pummel, PA-C  methotrexate 50 MG/2ML injection Inject 0.8 mLs (20 mg total) into the skin once a week. 04/10/16   Panwala, Naitik, PA-C  predniSONE (DELTASONE) 5 MG tablet 4po qAM x 4 days;3po qAM x  4 days;2po qAM x 4 days;1po qAM x 4 days;1/2po qAM x 4 days; then stop 06/24/16   Panwala, Naitik, PA-C  valACYclovir (VALTREX) 1000 MG tablet Take 1 tablet (1,000 mg total) by mouth 3 (three) times daily. 11/04/15   Copland, Karleen Hampshire, MD    Allergies Aspirin; Hydrocodone-acetaminophen; Ibuprofen; Imdur [isosorbide nitrate]; Meperidine hcl; Morphine; Oxycodone-acetaminophen; Procaine hcl; Toprol xl [metoprolol]; and Tramadol  Family History  Problem Relation Age of Onset  . Alcohol abuse Father   . Hypertension Father   . Heart disease Father   . Colon cancer Maternal Uncle   . Diabetes Maternal Grandmother   . Colon polyps Neg Hx   . Esophageal cancer Neg Hx   . Kidney disease Neg Hx   . Stomach cancer Neg Hx   . Rectal cancer Neg Hx     Social History Social History  Substance Use Topics  . Smoking status: Never Smoker  . Smokeless tobacco: Never Used  . Alcohol use No    Review of Systems Constitutional: No fever/chills Eyes: No visual changes. ENT: No sore throat. Cardiovascular: Positive for chest pain. Respiratory: Denies shortness of breath. Gastrointestinal: No abdominal pain.  No nausea, no vomiting.  No diarrhea.   Genitourinary: Negative for dysuria. Musculoskeletal: Negative for back pain. Skin: Negative for rash. Neurological: Negative for headaches, focal weakness or numbness.  ____________________________________________   PHYSICAL EXAM:  VITAL SIGNS: ED Triage Vitals  Enc Vitals Group     BP 07/01/16 1713 112/78     Pulse Rate 07/01/16 1713 75     Resp 07/01/16 1713 20     Temp 07/01/16 1713 98.2 F (36.8 C)     Temp Source 07/01/16 1713 Oral     SpO2 07/01/16 1713 100 %     Weight 07/01/16 1713 217 lb (98.4 kg)     Height 07/01/16 1713 5\' 7"  (1.702 m)     Head Circumference --      Peak Flow --      Pain Score 07/01/16 1707 5   Constitutional: Alert and oriented. Well appearing and in no distress. Eyes: Conjunctivae are normal.  ENT    Head: Normocephalic and atraumatic.   Nose: No congestion/rhinnorhea.   Mouth/Throat: Mucous membranes are moist.   Neck: No stridor. Hematological/Lymphatic/Immunilogical: No cervical lymphadenopathy. Cardiovascular: Normal rate, regular rhythm.  No murmurs, rubs, or gallops. Respiratory: Normal respiratory effort without tachypnea nor retractions. Breath sounds are clear and equal bilaterally. No wheezes/rales/rhonchi. Gastrointestinal: Soft and non tender. No rebound. No guarding.  Genitourinary: Deferred Musculoskeletal: Normal range of motion in all extremities. No lower extremity edema. Neurologic:  Normal speech and language. No gross focal neurologic deficits are appreciated.  Skin:  Skin is warm, dry and intact. No rash noted. Psychiatric: Mood and affect are normal. Speech and behavior are normal. Patient exhibits appropriate insight and judgment.  ____________________________________________    LABS (pertinent positives/negatives)  Labs Reviewed  CBC - Abnormal; Notable for the following:  Result Value   Hemoglobin 11.9 (*)    RDW 15.0 (*)    All other components within normal limits  BASIC METABOLIC PANEL  TROPONIN I  FIBRIN DERIVATIVES D-DIMER ([facility] ONLY)     ____________________________________________   EKG  I, Phineas Semen, attending physician, personally viewed and interpreted this EKG  EKG Time: 1700 Rate: 82 Rhythm: normal sinus rhythm Axis: normal Intervals: qtc 460 QRS: LVH ST changes: no st elevation Impression: abnormal ekg   ____________________________________________    RADIOLOGY  CXR IMPRESSION: No active cardiopulmonary disease.   ____________________________________________   PROCEDURES  Procedures  ____________________________________________   INITIAL IMPRESSION / ASSESSMENT AND PLAN / ED COURSE  Pertinent labs & imaging results that were available during my care of the patient were reviewed by me  and considered in my medical decision making (see chart for details).  Patient presented to the emergency department that day because of concerns for left sided chest pain. Workup here without elevation of troponin or d-dimer. This point I doubt heart damage or blood clot. Patient does have lupus however is not tachycardic, hypoxic or with a d-dimer greater than 500. I think it could possibly be related to pericarditis given that it is worse with lying flat. Will plan on discharging with colchicine. Discussed with patient and parents following up with primary care physician.  ____________________________________________   FINAL CLINICAL IMPRESSION(S) / ED DIAGNOSES  Final diagnoses:  Nonspecific chest pain  Pericarditis   Note: This dictation was prepared with Dragon dictation. Any transcriptional errors that result from this process are unintentional     Phineas Semen, MD 07/01/16 2054

## 2016-07-01 NOTE — ED Notes (Addendum)
Pt presents with chest pain x 2 weeks, gradually progressing. Pt states it is stabbing pain. She reports that it is worse when reclining, but also now bad when sitting and walking as well. Pt states pain is left chest to back. NAD noted.   Pt states she had MI in 2015 and this is different.

## 2016-07-01 NOTE — Discharge Instructions (Signed)
Please seek medical attention for any high fevers, chest pain, shortness of breath, change in behavior, persistent vomiting, bloody stool or any other new or concerning symptoms.  

## 2016-07-21 ENCOUNTER — Telehealth: Payer: Self-pay | Admitting: Rheumatology

## 2016-07-21 NOTE — Telephone Encounter (Signed)
Patient would like to know when she is due for labs

## 2016-07-21 NOTE — Telephone Encounter (Signed)
Patient advised labs are due in early August 2018.

## 2016-07-24 ENCOUNTER — Telehealth: Payer: Self-pay | Admitting: Rheumatology

## 2016-07-24 ENCOUNTER — Encounter: Payer: Self-pay | Admitting: Family Medicine

## 2016-07-24 ENCOUNTER — Ambulatory Visit (INDEPENDENT_AMBULATORY_CARE_PROVIDER_SITE_OTHER): Payer: BC Managed Care – PPO | Admitting: Family Medicine

## 2016-07-24 VITALS — BP 124/80 | HR 80 | Resp 12 | Ht 67.0 in | Wt 217.5 lb

## 2016-07-24 DIAGNOSIS — M797 Fibromyalgia: Secondary | ICD-10-CM

## 2016-07-24 DIAGNOSIS — M1990 Unspecified osteoarthritis, unspecified site: Secondary | ICD-10-CM | POA: Diagnosis not present

## 2016-07-24 MED ORDER — METHYLPREDNISOLONE ACETATE 80 MG/ML IJ SUSP
40.0000 mg | Freq: Once | INTRAMUSCULAR | Status: AC
  Administered 2016-07-24: 40 mg via INTRAMUSCULAR

## 2016-07-24 MED ORDER — PREDNISONE 5 MG PO TABS: ORAL_TABLET | ORAL | 0 refills | Status: DC

## 2016-07-24 MED ORDER — CYCLOBENZAPRINE HCL 10 MG PO TABS: 10.0000 mg | ORAL_TABLET | Freq: Three times a day (TID) | ORAL | 0 refills | Status: AC | PRN

## 2016-07-24 MED ORDER — KETOROLAC TROMETHAMINE 60 MG/2ML IM SOLN
60.0000 mg | Freq: Once | INTRAMUSCULAR | Status: AC
  Administered 2016-07-24: 60 mg via INTRAMUSCULAR

## 2016-07-24 NOTE — Progress Notes (Signed)
ACUTE VISIT:   HPI  Chief Complaint  Patient presents with  . pain all over    [name] is a 56 y.o. female, who is here today complaining of constant pain "everywhere."According to patient she has history of lupus and fibromyalgia, she is not sure which one is is causing symptoms now. She states that she tried to call her rheumatologist but the office was closed today.  "I am in pain", states that she cannot take OTC NSAIDs because they "irritate" her stomach. She stopped taking Acetaminophen also because was causing abdominal discomfort. In the past she has taken Flexeril and Methocarbamol. She doesn't have any prescription medication to help with the pain. States that pain is affecting her sleep.   + Limitation of ROM due to pain. She denies oral sores, facial rash. She states that she noted a pruritic lesion on left breast, not ulcerated or tender.   Arthritis  Presents for initial visit. The disease course has been fluctuating. She complains of stiffness and joint swelling. She reports no pain or joint warmth. Affected locations include the right shoulder, left shoulder, right elbow, left elbow, right wrist, left wrist, right MCP, left MCP, right PIP, left PIP, right DIP, right knee, left hip, right hip, left DIP, left knee, right ankle, left foot, right toes, left toes, right foot and left ankle. Her pain is at a severity of 8/10. Associated symptoms include fatigue. Pertinent negatives include no dry eyes, dry mouth, fever, pain at night, pain while resting or weight loss. Her past medical history is significant for chronic back pain and lupus. Risk factors do not include overuse. Past treatments include nothing. Exacerbated by: Movement.   Alleviated some by rest.  Currently she is on Methotrexate and Plaquenil. She denies any recent injury or unusual physical activity.  Review of Systems  Constitutional: Positive for fatigue. Negative for appetite change,  chills, fever and weight loss.  HENT: Negative for facial swelling, mouth sores, nosebleeds, sore throat and trouble swallowing.   Eyes: Negative for redness and visual disturbance.  Respiratory: Negative for cough, shortness of breath and wheezing.   Cardiovascular: Negative for palpitations and leg swelling.  Gastrointestinal: Negative for abdominal pain, nausea and vomiting.       Negative for changes in bowel habits.  Genitourinary: Negative for decreased urine volume and hematuria.  Musculoskeletal: Positive for arthralgias, arthritis, back pain, joint swelling, myalgias and stiffness. Negative for gait problem and neck pain.  Skin: Negative for color change and wound.  Neurological: Negative for weakness and headaches.  Psychiatric/Behavioral: Positive for sleep disturbance. Negative for confusion. The patient is nervous/anxious.       Current Outpatient Prescriptions on File Prior to Visit  Medication Sig Dispense Refill  . albuterol (PROVENTIL HFA;VENTOLIN HFA) 108 (90 BASE) MCG/ACT inhaler Inhale 2 puffs into the lungs every 6 (six) hours as needed for wheezing or shortness of breath.    . folic acid (FOLVITE) 1 MG tablet Take 1 tablet (1 mg total) by mouth 2 (two) times daily. 180 tablet 4  . hydroxychloroquine (PLAQUENIL) 200 MG tablet Take 1 tablet (200 mg total) by mouth 2 (two) times daily. 180 tablet 1  . levalbuterol (XOPENEX HFA) 45 MCG/ACT inhaler Inhale 1-2 puffs into the lungs every 4 (four) hours as needed for wheezing or shortness of breath.     . methotrexate 50 MG/2ML injection Inject 0.8 mLs (20 mg total) into the skin once a week. 10 mL 0  .  valACYclovir (VALTREX) 1000 MG tablet Take 1 tablet (1,000 mg total) by mouth 3 (three) times daily. 21 tablet 0   No current facility-administered medications on file prior to visit.      Past Medical History:  Diagnosis Date  . Anemia   . Asthma   . GERD (gastroesophageal reflux disease)   . History of stomach ulcers    . HTN (hypertension)   . Lupus   . MI (myocardial infarction) (HCC)    2013  . Rheumatoid arthritis (HCC)   . Thyroid mass   . Vertigo    Allergies  Allergen Reactions  . Aspirin Other (See Comments)    GI bleed  . Hydrocodone-Acetaminophen Hives and Nausea And Vomiting  . Ibuprofen Other (See Comments)    GI bleed  . Imdur [Isosorbide Nitrate] Other (See Comments)    Reaction unknown  . Meperidine Hcl Hives and Nausea And Vomiting    Short term memory loss  . Morphine Hives and Nausea And Vomiting  . Oxycodone-Acetaminophen Hives and Nausea And Vomiting    Reaction unknown  . Procaine Hcl Other (See Comments)    Ineffective  . Toprol Xl [Metoprolol] Other (See Comments)    Reaction unknown  . Tramadol Itching    Social History   Social History  . Marital status: Married    Spouse name: N/A  . Number of children: 4  . Years of education: N/A   Occupational History  . TEACHER ASSISTANT [employer]   Social History Main Topics  . Smoking status: Never Smoker  . Smokeless tobacco: Never Used  . Alcohol use No  . Drug use: No  . Sexual activity: Yes    Birth control/ protection: None, Surgical     Comment: LAVH   Other Topics Concern  . None   Social History Narrative  . None    Vitals:   07/24/16 1432  BP: 124/80  Pulse: 80  Resp: 12   Body mass index is 34.07 kg/m.   Physical Exam  Nursing note and vitals reviewed. Constitutional: She is oriented to person, place, and time. She appears well-developed. She does not appear ill. No distress.  HENT:  Head: Normocephalic and atraumatic.  Mouth/Throat: Oropharynx is clear and moist and mucous membranes are normal. No oral lesions. Abnormal dentition. Dental caries present.  Eyes: Conjunctivae and EOM are normal.  Cardiovascular: Normal rate and regular rhythm.   No murmur heard. Respiratory: Effort normal and breath sounds normal. No respiratory distress.  Musculoskeletal: She exhibits  tenderness. She exhibits no edema or deformity.  Tenderness upon palpation of lateral aspect of elbows, wrists,MCP and some IP. Also tenderness upon palpation of cervical and lumbar paraspinal muscles.+ Pain on trapezium, bilateral, with spasm.  Pain elicited with movement , limited shoulders ROM due to pain. Mild edema dorsal aspect of wrists. No joint erythema.  Lymphadenopathy:    She has no cervical adenopathy.  Neurological: She is alert and oriented to person, place, and time. She has normal strength. Gait normal.  SLR negative bilateral.  Skin: Skin is warm. Rash noted. Rash is papular. Rash is not vesicular. No erythema.  A 3 mm papular erythematous lesion on left breast, not tender, no local heat, and not indurated.  Psychiatric: Her mood appears anxious.  Well groomed, good eye contact.    ASSESSMENT AND PLAN:   Patricia Reed was seen today for pain all over.  Diagnoses and all orders for this visit:  Fibromyalgia  After discussion of side  effects of Toradol and verbal consent she received Toradol 60 mg IM x 1. Lab Results  Component Value Date   CREATININE 0.55 07/01/2016   BUN 15 07/01/2016   NA 139 07/01/2016   K 3.9 07/01/2016   CL 105 07/01/2016   CO2 26 07/01/2016   Flexeril side effects discussed. Low impact exercise and good sleep hygiene may also help. F/U with PCP as needed and keep next appt with rheuma.   -     cyclobenzaprine (FLEXERIL) 10 MG tablet; Take 1 tablet (10 mg total) by mouth 3 (three) times daily as needed for muscle spasms. -     methylPREDNISolone acetate (DEPO-MEDROL) injection 40 mg; Inject 0.5 mLs (40 mg total) into the muscle once. -     ketorolac (TORADOL) injection 60 mg; Inject 2 mLs (60 mg total) into the muscle once.  Inflammatory arthritis  She has taken Prednisone before and has tolerated it well. Some side effects discussed including GI.Recommended takigit with food. Prednisone taper to start tomorrow. F/U with PCP as  needed.   -     predniSONE (DELTASONE) 5 MG tablet; 4po qAM x 4 days;3po qAM x 4 days;2po qAM x 4 days;1po qAM x 4 days;1/2po qAM x 4 days; then stop -     methylPREDNISolone acetate (DEPO-MEDROL) injection 40 mg; Inject 0.5 mLs (40 mg total) into the muscle once.     -Patricia Reed was advised to seek immediate medical attention if symptoms suddenly get worse or to follow if symptoms persist or new concerns arise.       [name]. [place], MD  [facility]. [place] office.

## 2016-07-24 NOTE — Telephone Encounter (Signed)
Spoke with patient when she called in and advised patient that we have no providers in the office and if with her pain being so great the recommendation sis she follow up with urgent care or her PCP. Patient only called in Tuesday requesting information on when her next labs were due. Patient did not mention pain at that time.

## 2016-07-24 NOTE — Patient Instructions (Signed)
  Patricia Reed I have seen you today for an acute visit.  A few things to remember from today's visit:   Fibromyalgia - Plan: cyclobenzaprine (FLEXERIL) 10 MG tablet  Polyarthralgia - Plan: predniSONE (DELTASONE) 5 MG tablet   Here in office Depo Medrol and Toradol. Tomorrow start Prednisone and take it with Breakfast.     Medications prescribed today are intended for short period of time and will not be refill upon request, a follow up appointment might be necessary to discuss continuation of of treatment if appropriate.     In general please monitor for signs of worsening symptoms and seek immediate medical attention if any concerning.  If symptoms are not resolved in 2 weeks you should schedule a follow up appointment with your doctor, before if needed.

## 2016-07-24 NOTE — Telephone Encounter (Signed)
Patient was expecting a call back from Tuesday to advise her on what to do for the pain she is experiencing. Patient states pain is so great she is having trouble sleeping now.

## 2016-08-19 ENCOUNTER — Other Ambulatory Visit: Payer: Self-pay | Admitting: *Deleted

## 2016-08-19 ENCOUNTER — Other Ambulatory Visit: Payer: Self-pay

## 2016-08-19 DIAGNOSIS — R5383 Other fatigue: Secondary | ICD-10-CM

## 2016-08-19 DIAGNOSIS — Z79899 Other long term (current) drug therapy: Secondary | ICD-10-CM

## 2016-08-19 LAB — CBC WITH DIFFERENTIAL/PLATELET
Basophils Absolute: 0 cells/uL (ref 0–200)
Basophils Relative: 0 %
EOS ABS: 90 {cells}/uL (ref 15–500)
Eosinophils Relative: 3 %
HCT: 39.7 % (ref 35.0–45.0)
HEMOGLOBIN: 12.7 g/dL (ref 11.7–15.5)
LYMPHS ABS: 870 {cells}/uL (ref 850–3900)
Lymphocytes Relative: 29 %
MCH: 28 pg (ref 27.0–33.0)
MCHC: 32 g/dL (ref 32.0–36.0)
MCV: 87.4 fL (ref 80.0–100.0)
MONO ABS: 180 {cells}/uL — AB (ref 200–950)
MPV: 10.5 fL (ref 7.5–12.5)
Monocytes Relative: 6 %
NEUTROS PCT: 62 %
Neutro Abs: 1860 cells/uL (ref 1500–7800)
Platelets: 268 10*3/uL (ref 140–400)
RBC: 4.54 MIL/uL (ref 3.80–5.10)
RDW: 14.7 % (ref 11.0–15.0)
WBC: 3 10*3/uL — AB (ref 3.8–10.8)

## 2016-08-20 ENCOUNTER — Telehealth: Payer: Self-pay | Admitting: Radiology

## 2016-08-20 LAB — COMPLETE METABOLIC PANEL WITH GFR
ALBUMIN: 3.8 g/dL (ref 3.6–5.1)
ALT: 10 U/L (ref 6–29)
AST: 14 U/L (ref 10–35)
Alkaline Phosphatase: 59 U/L (ref 33–130)
BUN: 13 mg/dL (ref 7–25)
CALCIUM: 8.8 mg/dL (ref 8.6–10.4)
CO2: 23 mmol/L (ref 20–31)
CREATININE: 0.73 mg/dL (ref 0.50–1.05)
Chloride: 106 mmol/L (ref 98–110)
GFR, Est African American: >89 mL/min (ref >=60)
GFR, Est Non African American: >89 mL/min (ref >=60)
Glucose, Bld: 86 mg/dL (ref 65–99)
POTASSIUM: 4.3 mmol/L (ref 3.5–5.3)
Sodium: 143 mmol/L (ref 135–146)
Total Bilirubin: 0.6 mg/dL (ref 0.2–1.2)
Total Protein: 6.9 g/dL (ref 6.1–8.1)

## 2016-08-20 LAB — TSH: TSH: 0.58 mIU/L

## 2016-08-20 LAB — VITAMIN D 25 HYDROXY (VIT D DEFICIENCY, FRACTURES): Vit D, 25-Hydroxy: 38 ng/mL (ref 30–100)

## 2016-08-20 NOTE — Telephone Encounter (Signed)
-----   Message from Pollyann Savoy, MD sent at 08/20/2016  8:24 AM EDT ----- WNL

## 2016-08-20 NOTE — Progress Notes (Signed)
WNL

## 2016-08-20 NOTE — Telephone Encounter (Signed)
I have called patient to advise labs are normal  

## 2016-08-20 NOTE — Progress Notes (Signed)
Reduce methotrexate to 0.6 mL subcutaneous every week. Check CBC in 1 month

## 2016-08-21 ENCOUNTER — Other Ambulatory Visit: Payer: Self-pay | Admitting: *Deleted

## 2016-08-21 MED ORDER — "ALLERGY SYRINGE 27G X 1/2"" 1 ML MISC": 3 refills | Status: DC

## 2016-08-21 NOTE — Progress Notes (Signed)
t

## 2016-08-28 ENCOUNTER — Encounter: Payer: Self-pay | Admitting: Rheumatology

## 2016-08-28 ENCOUNTER — Ambulatory Visit (INDEPENDENT_AMBULATORY_CARE_PROVIDER_SITE_OTHER): Payer: BC Managed Care – PPO | Admitting: Rheumatology

## 2016-08-28 DIAGNOSIS — R5382 Chronic fatigue, unspecified: Secondary | ICD-10-CM | POA: Diagnosis not present

## 2016-08-28 DIAGNOSIS — M62838 Other muscle spasm: Secondary | ICD-10-CM | POA: Diagnosis not present

## 2016-08-28 DIAGNOSIS — Z79899 Other long term (current) drug therapy: Secondary | ICD-10-CM

## 2016-08-28 DIAGNOSIS — M797 Fibromyalgia: Secondary | ICD-10-CM | POA: Diagnosis not present

## 2016-08-28 DIAGNOSIS — D8989 Other specified disorders involving the immune mechanism, not elsewhere classified: Secondary | ICD-10-CM

## 2016-08-28 DIAGNOSIS — F5101 Primary insomnia: Secondary | ICD-10-CM

## 2016-08-28 DIAGNOSIS — M7062 Trochanteric bursitis, left hip: Secondary | ICD-10-CM

## 2016-08-28 DIAGNOSIS — M359 Systemic involvement of connective tissue, unspecified: Secondary | ICD-10-CM

## 2016-08-28 MED ORDER — LIDOCAINE HCL (PF) 1 % IJ SOLN
0.3000 mL | INTRAMUSCULAR | Status: AC | PRN
Start: 2016-08-28 — End: 2016-08-28
  Administered 2016-08-28: .3 mL

## 2016-08-28 MED ORDER — METHOCARBAMOL 500 MG PO TABS: 500.0000 mg | ORAL_TABLET | Freq: Two times a day (BID) | ORAL | 2 refills | Status: AC | PRN

## 2016-08-28 MED ORDER — METHOTREXATE SODIUM CHEMO INJECTION 50 MG/2ML: 20.0000 mg | INTRAMUSCULAR | 0 refills | Status: DC

## 2016-08-28 MED ORDER — TRIAMCINOLONE ACETONIDE 40 MG/ML IJ SUSP
40.0000 mg | INTRAMUSCULAR | Status: AC | PRN
  Administered 2016-08-28: 40 mg via INTRA_ARTICULAR

## 2016-08-28 MED ORDER — TIZANIDINE HCL 4 MG PO TABS: 4.0000 mg | ORAL_TABLET | Freq: Every day | ORAL | 2 refills | Status: AC

## 2016-08-28 MED ORDER — LIDOCAINE HCL 1 % IJ SOLN
1.5000 mL | INTRAMUSCULAR | Status: AC | PRN
  Administered 2016-08-28: 1.5 mL

## 2016-08-28 MED ORDER — TRIAMCINOLONE ACETONIDE 40 MG/ML IJ SUSP
10.0000 mg | INTRAMUSCULAR | Status: AC | PRN
  Administered 2016-08-28: 10 mg via INTRAMUSCULAR

## 2016-08-28 NOTE — Progress Notes (Signed)
Office Visit Note  [name]             Date of Birth: 09-Jan-1961           MRN: [id number]             PCP: Jinny Sanders, MD Referring: Jinny Sanders, MD Visit Date: 08/28/2016 Occupation: _0 @    Subjective:  Injections  History of Present Illness: Patricia Reed is a 56 y.o. female  : Was recently seen on 04/28/2016 for autoimmune disease, Raynaud's, high-risk prescription (methotrexate 0.8 ML's per week and Plaquenil 200 mg twice a day) and fibromyalgia.  Today, patient presents because she is needing injections for bilateral trapezius muscle pain and left greater trochanter bursa pain. She is also having significant pain to the right greater trochanteric bursa and bilateral SI joint but we are unable to inject those at this time. I've advised her to come back another time if her problem continues and she needs it was injected. At [facility], we can do 80 mg of Kenalog at the next visit if her blood pressures normal. Patient understands and is agreeable   I've asked the patient to get Plaquenil eye exam annually. Unfortunately we do not have updated Plaquenil eye exam. I've advised the patient that we will be unable to refill Plaquenil in the future without getting updated Plaquenil eye exam. Patient states that she's having some insurance problems but she understands and she will get the Plaquenil eye exam in the next 30-60 days.  Patient reports that her recent labs showed decrease in her white count. As a result her methotrexate was decreased from 0.8 ML's every week to 0.6 ML's every week. She appears to be having good response at the 0.6 ML's per week but she is only done it for a short period of time. She may need 0.7 mL's per week and has asked me to refill her methotrexate at the previous dose so that she doesn't have to go and get extra medicine if she were to run out if we were to change her dose to a higher dose. I am agreeable.  Patient reports that  she's been having a flare of her fibromyalgia. She's had been having severe muscle cramps and pain over the last 2 weeks. Especially painful or her bilateral trapezius muscles, bilateral SI joints, and bilateral greater trochanter bursa. She presents today because she wants an injection.  She denies that she's taking any muscle relaxer. I advised her that we can try a muscle relaxer for the patient to minimize these type of flares in the future and she is agreeable.  Activities of Daily Living:  Patient reports morning stiffness for 15 minutes.   Patient Reports nocturnal pain.  Difficulty dressing/grooming: Reports Difficulty climbing stairs: Reports Difficulty getting out of chair: Reports Difficulty using hands for taps, buttons, cutlery, and/or writing: Reports   Review of Systems  Constitutional: Positive for fatigue.  HENT: Negative for mouth sores and mouth dryness.   Eyes: Negative for dryness.  Respiratory: Negative for shortness of breath.   Gastrointestinal: Negative for constipation and diarrhea.  Musculoskeletal: Positive for myalgias and myalgias.  Skin: Negative for sensitivity to sunlight.  Psychiatric/Behavioral: Positive for sleep disturbance. Negative for decreased concentration.    PMFS History:  Patient Active Problem List   Diagnosis Date Noted  . Situational anxiety 06/08/2016  . Atypical chest pain 06/08/2016  . Trapezius muscle spasm 04/23/2016  . Fibromyalgia 02/13/2016  . Primary insomnia  02/13/2016  . Chronic fatigue 02/13/2016  . Asthma 11/25/2015  . Vitamin D deficiency 11/25/2015  . Autoimmune disease ([place]) 11/23/2015  . High risk medication use 11/23/2015  . Colon cancer screening 01/25/2014  . Anterior neck pain 12/01/2013  . Inflammatory arthritis 08/18/2012  . Allergic rhinitis 06/23/2012  . HTN (hypertension) 06/23/2012  . High cholesterol 06/23/2012  . Thyroid nodule - benign 06/23/2012  . Mild intermittent asthma   . Sickle cell  trait ([place])   . History of stomach ulcers   . ESOPHAGEAL STRICTURE 10/04/2007  . GERD 10/04/2007  . GASTRITIS, CHRONIC 10/04/2007  . Dysphagia, pharyngoesophageal phase 10/04/2007    Past Medical History:  Diagnosis Date  . Anemia   . Asthma   . GERD (gastroesophageal reflux disease)   . History of stomach ulcers   . HTN (hypertension)   . Lupus   . MI (myocardial infarction) ([place])    2013  . Rheumatoid arthritis ([place])   . Thyroid mass   . Vertigo     Family History  Problem Relation Age of Onset  . Alcohol abuse Father   . Hypertension Father   . Heart disease Father   . Colon cancer Maternal Uncle   . Diabetes Maternal Grandmother   . Colon polyps Neg Hx   . Esophageal cancer Neg Hx   . Kidney disease Neg Hx   . Stomach cancer Neg Hx   . Rectal cancer Neg Hx    Past Surgical History:  Procedure Laterality Date  . ABDOMINAL HYSTERECTOMY     done for menorrhagia, ovaries remain  . CERVICAL DISCECTOMY     plates and screws in neck  . CESAREAN SECTION    . COLONOSCOPY    . ESOPHAGEAL MANOMETRY N/A 02/12/2014   Procedure: ESOPHAGEAL MANOMETRY (EM);  Surgeon: Inda Castle, MD;  Location: [facility];  Service: Endoscopy;  Laterality: N/A;  . ESOPHAGOGASTRODUODENOSCOPY  03/04/2011   Procedure: ESOPHAGOGASTRODUODENOSCOPY (EGD);  Surgeon: Inda Castle, MD;  Location: [facility];  Service: Endoscopy;  Laterality: N/A;  BOTOX Injection  . exc benign breast lump    . TONSILLECTOMY    . UPPER GASTROINTESTINAL ENDOSCOPY     Social History   Social History Narrative  . No narrative on file     Objective: Vital Signs: There were no vitals taken for this visit.   Physical Exam  Constitutional: She is oriented to person, place, and time. She appears well-developed and well-nourished.  HENT:  Head: Normocephalic and atraumatic.  Eyes: Pupils are equal, round, and reactive to light. EOM are normal.  Cardiovascular: Normal rate, regular rhythm and normal heart  sounds.  Exam reveals no gallop and no friction rub.   No murmur heard. Pulmonary/Chest: Effort normal and breath sounds normal. She has no wheezes. She has no rales.  Abdominal: Soft. Bowel sounds are normal. She exhibits no distension. There is no tenderness. There is no guarding. No hernia.  Musculoskeletal: Normal range of motion. She exhibits no edema, tenderness or deformity.  Lymphadenopathy:    She has no cervical adenopathy.  Neurological: She is alert and oriented to person, place, and time. Coordination normal.  Skin: Skin is warm and dry. Capillary refill takes less than 2 seconds. No rash noted.  Psychiatric: She has a normal mood and affect. Her behavior is normal.  Nursing note and vitals reviewed.    Musculoskeletal Exam:  Full range of motion of all joints Grip strength is equal and strong bilaterally Fibromyalgia tender points are  18 out of 18 positive  CDAI Exam: CDAI Homunculus Exam:   Joint Counts:  CDAI Tender Joint count: 0 CDAI Swollen Joint count: 0   No synovitis on examination  Investigation: No additional findings. Orders Only on 08/19/2016  Component Date Value Ref Range Status  . Vit D, 25-Hydroxy 08/19/2016 38  30 - 100 ng/mL Final   Comment: Vitamin D Status           25-OH Vitamin D        Deficiency                <20 ng/mL        Insufficiency         20 - 29 ng/mL        Optimal             > or = 30 ng/mL   For 25-OH Vitamin D testing on patients on D2-supplementation and patients for whom quantitation of D2 and D3 fractions is required, the QuestAssureD 25-OH VIT D, (D2,D3), LC/MS/MS is recommended: order code [phone] [phone] (patients > 2 yrs).   . TSH 08/19/2016 0.58  mIU/L Final   Comment:   Reference Range   > or = 20 Years  0.40-4.50   Pregnancy Range First trimester  0.26-2.66 Second trimester 0.55-2.73 Third trimester  0.43-2.91     Orders Only on 08/19/2016  Component Date Value Ref Range Status  . WBC 08/19/2016 3.0* 3.8 -  10.8 K/uL Final  . RBC 08/19/2016 4.54  3.80 - 5.10 MIL/uL Final  . Hemoglobin 08/19/2016 12.7  11.7 - 15.5 g/dL Final  . HCT 08/19/2016 39.7  35.0 - 45.0 % Final  . MCV 08/19/2016 87.4  80.0 - 100.0 fL Final  . MCH 08/19/2016 28.0  27.0 - 33.0 pg Final  . MCHC 08/19/2016 32.0  32.0 - 36.0 g/dL Final  . RDW 08/19/2016 14.7  11.0 - 15.0 % Final  . Platelets 08/19/2016 268  140 - 400 K/uL Final  . MPV 08/19/2016 10.5  7.5 - 12.5 fL Final  . Neutro Abs 08/19/2016 1860  1,500 - 7,800 cells/uL Final  . Lymphs Abs 08/19/2016 870  850 - 3,900 cells/uL Final  . Monocytes Absolute 08/19/2016 180* 200 - 950 cells/uL Final  . Eosinophils Absolute 08/19/2016 90  15 - 500 cells/uL Final  . Basophils Absolute 08/19/2016 0  0 - 200 cells/uL Final  . Neutrophils Relative % 08/19/2016 62  % Final  . Lymphocytes Relative 08/19/2016 29  % Final  . Monocytes Relative 08/19/2016 6  % Final  . Eosinophils Relative 08/19/2016 3  % Final  . Basophils Relative 08/19/2016 0  % Final  . Smear Review 08/19/2016 Criteria for review not met   Final  . Sodium 08/19/2016 143  135 - 146 mmol/L Final  . Potassium 08/19/2016 4.3  3.5 - 5.3 mmol/L Final  . Chloride 08/19/2016 106  98 - 110 mmol/L Final  . CO2 08/19/2016 23  20 - 31 mmol/L Final  . Glucose, Bld 08/19/2016 86  65 - 99 mg/dL Final  . BUN 08/19/2016 13  7 - 25 mg/dL Final  . Creat 08/19/2016 0.73  0.50 - 1.05 mg/dL Final   Comment:   For patients > or = 54 years of age: The upper reference limit for Creatinine is approximately 13% higher for people identified as African-American.     . Total Bilirubin 08/19/2016 0.6  0.2 - 1.2 mg/dL Final  . Alkaline Phosphatase 08/19/2016 59  33 - 130 U/L Final  . AST 08/19/2016 14  10 - 35 U/L Final  . ALT 08/19/2016 10  6 - 29 U/L Final  . Total Protein 08/19/2016 6.9  6.1 - 8.1 g/dL Final  . Albumin 08/19/2016 3.8  3.6 - 5.1 g/dL Final  . Calcium 08/19/2016 8.8  8.6 - 10.4 mg/dL Final  . GFR, Est African  American 08/19/2016 >89  >=60 mL/min Final  . GFR, Est Non African American 08/19/2016 >89  >=60 mL/min Final  Admission on 07/01/2016, Discharged on 07/01/2016  Component Date Value Ref Range Status  . Sodium 07/01/2016 139  135 - 145 mmol/L Final  . Potassium 07/01/2016 3.9  3.5 - 5.1 mmol/L Final  . Chloride 07/01/2016 105  101 - 111 mmol/L Final  . CO2 07/01/2016 26  22 - 32 mmol/L Final  . Glucose, Bld 07/01/2016 91  65 - 99 mg/dL Final  . BUN 07/01/2016 15  6 - 20 mg/dL Final  . Creatinine, Ser 07/01/2016 0.55  0.44 - 1.00 mg/dL Final  . Calcium 07/01/2016 9.1  8.9 - 10.3 mg/dL Final  . GFR calc non Af Amer 07/01/2016 >60  >60 mL/min Final  . GFR calc Af Amer 07/01/2016 >60  >60 mL/min Final   Comment: (NOTE) The eGFR has been calculated using the CKD EPI equation. This calculation has not been validated in all clinical situations. eGFR's persistently <60 mL/min signify possible Chronic Kidney Disease.   . Anion gap 07/01/2016 8  5 - 15 Final  . WBC 07/01/2016 5.4  3.6 - 11.0 K/uL Final  . RBC 07/01/2016 4.23  3.80 - 5.20 MIL/uL Final  . Hemoglobin 07/01/2016 11.9* 12.0 - 16.0 g/dL Final  . HCT 07/01/2016 37.0  35.0 - 47.0 % Final  . MCV 07/01/2016 87.4  80.0 - 100.0 fL Final  . MCH 07/01/2016 28.1  26.0 - 34.0 pg Final  . MCHC 07/01/2016 32.1  32.0 - 36.0 g/dL Final  . RDW 07/01/2016 15.0* 11.5 - 14.5 % Final  . Platelets 07/01/2016 307  150 - 440 K/uL Final  . Troponin I 07/01/2016 <0.03  <0.03 ng/mL Final  . Fibrin derivatives D-dimer ([facility]) 07/01/2016 490.41  0.00 - 499.00 Final   Comment: (NOTE) <> Exclusion of Venous Thromboembolism (VTE) - OUTPATIENT ONLY   (Emergency Department or [place])   0-499 ng/ml (FEU): With a low to intermediate pretest probability                      for VTE this test result excludes the diagnosis                      of VTE.   >499 ng/ml (FEU) : VTE not excluded; additional work up for VTE is                      required. <> Testing on  Inpatients and Evaluation of Disseminated Intravascular   Coagulation (DIC) Reference Range:   0-499 ng/ml (FEU)   Orders Only on 05/26/2016  Component Date Value Ref Range Status  . WBC 05/26/2016 5.8  3.8 - 10.8 K/uL Final  . RBC 05/26/2016 4.46  3.80 - 5.10 MIL/uL Final  . Hemoglobin 05/26/2016 12.3  11.7 - 15.5 g/dL Final  . HCT 05/26/2016 39.2  35.0 - 45.0 % Final  . MCV 05/26/2016 87.9  80.0 - 100.0 fL Final  . MCH 05/26/2016 27.6  27.0 - 33.0 pg  Final  . MCHC 05/26/2016 31.4* 32.0 - 36.0 g/dL Final  . RDW 05/26/2016 14.7  11.0 - 15.0 % Final  . Platelets 05/26/2016 292  140 - 400 K/uL Final  . MPV 05/26/2016 10.3  7.5 - 12.5 fL Final  . Neutro Abs 05/26/2016 4524  1,500 - 7,800 cells/uL Final  . Lymphs Abs 05/26/2016 986  850 - 3,900 cells/uL Final  . Monocytes Absolute 05/26/2016 232  200 - 950 cells/uL Final  . Eosinophils Absolute 05/26/2016 0* 15 - 500 cells/uL Final  . Basophils Absolute 05/26/2016 58  0 - 200 cells/uL Final  . Neutrophils Relative % 05/26/2016 78  % Final  . Lymphocytes Relative 05/26/2016 17  % Final  . Monocytes Relative 05/26/2016 4  % Final  . Eosinophils Relative 05/26/2016 0  % Final  . Basophils Relative 05/26/2016 1  % Final  . Smear Review 05/26/2016 Criteria for review not met   Final  . Sodium 05/26/2016 145  135 - 146 mmol/L Final  . Potassium 05/26/2016 3.7  3.5 - 5.3 mmol/L Final  . Chloride 05/26/2016 108  98 - 110 mmol/L Final  . CO2 05/26/2016 25  20 - 31 mmol/L Final  . Glucose, Bld 05/26/2016 101* 65 - 99 mg/dL Final  . BUN 05/26/2016 13  7 - 25 mg/dL Final  . Creat 05/26/2016 0.70  0.50 - 1.05 mg/dL Final   Comment:   For patients > or = 54 years of age: The upper reference limit for Creatinine is approximately 13% higher for people identified as African-American.     . Total Bilirubin 05/26/2016 0.6  0.2 - 1.2 mg/dL Final  . Alkaline Phosphatase 05/26/2016 57  33 - 130 U/L Final  . AST 05/26/2016 16  10 - 35 U/L Final  . ALT  05/26/2016 11  6 - 29 U/L Final  . Total Protein 05/26/2016 7.4  6.1 - 8.1 g/dL Final  . Albumin 05/26/2016 4.3  3.6 - 5.1 g/dL Final  . Calcium 05/26/2016 8.9  8.6 - 10.4 mg/dL Final  . GFR, Est African American 05/26/2016 >89  >=60 mL/min Final  . GFR, Est Non African American 05/26/2016 >89  >=60 mL/min Final     Imaging: No results found.  Speciality Comments: No specialty comments available.    Procedures:  Trigger Point Inj Date/Time: 08/28/2016 10:33 AM Performed by: Eliezer Lofts Authorized by: Eliezer Lofts   Consent Given by:  Patient Site marked: the procedure site was marked   Timeout: prior to procedure the correct patient, procedure, and site was verified   Indications:  Muscle spasm and pain Total # of Trigger Points:  2 Location: neck   Needle Size:  27 G Approach:  Dorsal Medications #1:  10 mg triamcinolone acetonide 40 MG/ML; 0.3 mL lidocaine (PF) 1 % Patient tolerance:  Patient tolerated the procedure well with no immediate complications Comments: Bilateral trapezius muscles were injected without complication. Patient tolerated procedure well. Large Joint Inj Date/Time: 08/28/2016 10:34 AM Performed by: Eliezer Lofts Authorized by: Eliezer Lofts   Consent Given by:  Patient Site marked: the procedure site was marked   Timeout: prior to procedure the correct patient, procedure, and site was verified   Indications:  Pain Location:  Hip Site:  L greater trochanter Prep: patient was prepped and draped in usual sterile fashion   Needle Size:  27 G Approach:  Superior Ultrasound Guidance: No   Fluoroscopic Guidance: No   Arthrogram: No   Medications:  1.5 mL  lidocaine 1 %; 40 mg triamcinolone acetonide 40 MG/ML Aspiration Attempted: Yes   Aspirate amount (mL):  0 Patient tolerance:  Patient tolerated the procedure well with no immediate complications  Patient tolerated procedure well. She had partial immediate relief.   Allergies: Aspirin;  Hydrocodone-acetaminophen; Ibuprofen; Imdur [isosorbide nitrate]; Meperidine hcl; Morphine; Oxycodone-acetaminophen; Procaine hcl; Toprol xl [metoprolol]; and Tramadol   Assessment / Plan:     Visit Diagnoses: Trapezius muscle spasm - Plan: Trigger Point Injection  Autoimmune disease ([place])  High risk medication use  Fibromyalgia  Greater trochanteric bursitis of left hip - Plan: Large Joint Injection/Arthrocentesis  Chronic fatigue  Primary insomnia   Plan: History of autoimmune disease. Stable. Doing well.  High risk prescription Methotrexate 0.6 mL's currently; recently changed from 0.8 ML's because patient's white blood cell count had gone down to 3.0. Patient will need repeat labs in one month.   Fibromyalgia flare. Bilateral trapezius muscle pain. Injected please see procedure note Bilateral greater trochanteric bursa pain. Only left greater trochanteric bursa which it injected with 40 mg of Kenalog mixed with one half mL's 1% lidocaine. No complication. Immediate relief after the injection Bilateral SI joint pain but we're unable to inject that at this time. Patient will use Voltaren gel  Prescription for Voltaren gel 3 tubes with 3 refills  Prescription for Robaxin 500 mg twice a day 60 pills with 2 refills Prescription for this ties tizanidine 4 mg 1 by mouth daily at bedtime when necessary 30 day supply with 2 refills  Autoimmune disease is doing well. Patient continues to take Plaquenil and methotrexate as prescribed. Currently her white count went low and so her methotrexate was decreased from 0.8 ML's to 0.6 mls. I will refill her methotrexate but at 0.8 ML's in case we need to increase her dose in case she flares with her RA. She is aware that she needs to take only 0.6. In case she needs to take 0.7, she will have adequate amount of medication. No oral or nasal ulcers, no additional fatigue, no arthralgia, no rash. No Raynaud's flare.  I spoke with the patient  regarding her Plaquenil eye exam is past due. Patient states that she had insurance issues and they didn't put enough money in the flex card. She states that she will still get the eye exam done ASAP. She states that she will try to get it done in the next 30-60 days.  Return to clinic as scheduled. On 09/29/2016 with Dr. Estanislado Pandy; patient may need additional injections at the next office visit or prior to that if her bilateral SI joints or right greater trochanter bursa continued to be painful; she cannot get any shots in bilateral trapezius muscles or left greater trochanteric bursa since that was injected today.  Orders: Orders Placed This Encounter  Procedures  . Trigger Point Injection  . Large Joint Injection/Arthrocentesis   Meds ordered this encounter  Medications  . methocarbamol (ROBAXIN) 500 MG tablet    Sig: Take 1 tablet (500 mg total) by mouth 2 (two) times daily as needed for muscle spasms.    Dispense:  60 tablet    Refill:  2    Order Specific Question:   Supervising Provider    Answer:   Bo Merino [[id number]]  . tiZANidine (ZANAFLEX) 4 MG tablet    Sig: Take 1 tablet (4 mg total) by mouth at bedtime.    Dispense:  30 tablet    Refill:  2    Order Specific Question:  Supervising Provider    Answer:   Bo Merino [[id number]]  . methotrexate 50 MG/2ML injection    Sig: Inject 0.8 mLs (20 mg total) into the skin once a week.    Dispense:  10 mL    Refill:  0    Dispense methotrexate with preservative. Give to last for 90 days.    Order Specific Question:   Supervising Provider    Answer:   Bo Merino [phone]    Face-to-face time spent with patient was 30 minutes. 50% of time was spent in counseling and coordination of care.  Follow-Up Instructions: No Follow-up on file.   Eliezer Lofts, PA-C  Note - This record has been created using [employer].  Chart creation errors have been sought, but may not always  have been located. Such creation  errors do not reflect on  the standard of medical care.

## 2016-09-21 NOTE — Progress Notes (Deleted)
Office Visit Note  [name]             Date of Birth: 12/06/1960           MRN: [id number]             PCP: Excell Seltzer, MD Referring: Excell Seltzer, MD Visit Date: 09/29/2016 Occupation: @GUAROCC @    Subjective:  No chief complaint on file.   History of Present Illness: Patricia RAESHAWN Reed is a 56 y.o. female ***   Activities of Daily Living:  Patient reports morning stiffness for *** {minute/hour:19697}.   Patient {ACTIONS;DENIES/REPORTS:21021675::"Denies"} nocturnal pain.  Difficulty dressing/grooming: {ACTIONS;DENIES/REPORTS:21021675::"Denies"} Difficulty climbing stairs: {ACTIONS;DENIES/REPORTS:21021675::"Denies"} Difficulty getting out of chair: {ACTIONS;DENIES/REPORTS:21021675::"Denies"} Difficulty using hands for taps, buttons, cutlery, and/or writing: {ACTIONS;DENIES/REPORTS:21021675::"Denies"}   No Rheumatology ROS completed.   PMFS History:  Patient Active Problem List   Diagnosis Date Noted  . Situational anxiety 06/08/2016  . Atypical chest pain 06/08/2016  . Trapezius muscle spasm 04/23/2016  . Fibromyalgia 02/13/2016  . 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 Chronic fatigue 02/13/2016  . Asthma 11/25/2015  . Vitamin D deficiency 11/25/2015  . Autoimmune disease (HCC) 11/23/2015  . High risk medication use 11/23/2015  . Colon cancer screening 01/25/2014  . Anterior neck pain 12/01/2013  . Inflammatory arthritis 08/18/2012  . Allergic rhinitis 06/23/2012  . HTN (hypertension) 06/23/2012  . High cholesterol 06/23/2012  . Thyroid nodule - benign 06/23/2012  . Mild intermittent asthma   . Sickle cell trait (HCC)   . History of stomach ulcers   . ESOPHAGEAL STRICTURE 10/04/2007  . GERD 10/04/2007  . GASTRITIS, CHRONIC 10/04/2007  . Dysphagia, pharyngoesophageal phase 10/04/2007    Past Medical History:  Diagnosis Date  . Anemia   . Asthma   . GERD (gastroesophageal reflux disease)   . History of stomach ulcers   . HTN  (hypertension)   . Lupus   . MI (myocardial infarction) (HCC)    2013  . Rheumatoid arthritis (HCC)   . Thyroid mass   . Vertigo     Family History  Problem Relation Age of Onset  . Alcohol abuse Father   . Hypertension Father   . Heart disease Father   . Colon cancer Maternal Uncle   . Diabetes Maternal Grandmother   . Colon polyps Neg Hx   . Esophageal cancer Neg Hx   . Kidney disease Neg Hx   . Stomach cancer Neg Hx   . Rectal cancer Neg Hx    Past Surgical History:  Procedure Laterality Date  . ABDOMINAL HYSTERECTOMY     done for menorrhagia, ovaries remain  . CERVICAL DISCECTOMY     plates and screws in neck  . CESAREAN SECTION    . COLONOSCOPY    . ESOPHAGEAL MANOMETRY N/A 02/12/2014   Procedure: ESOPHAGEAL MANOMETRY (EM);  Surgeon: 02/14/2014, MD;  Location: [facility];  Service: Endoscopy;  Laterality: N/A;  . ESOPHAGOGASTRODUODENOSCOPY  03/04/2011   Procedure: ESOPHAGOGASTRODUODENOSCOPY (EGD);  Surgeon: 03/06/2011, MD;  Location: [facility];  Service: Endoscopy;  Laterality: N/A;  BOTOX Injection  . exc benign breast lump    . TONSILLECTOMY    . UPPER GASTROINTESTINAL ENDOSCOPY     Social History   Social History Narrative  . No narrative on file     Objective: Vital Signs: There were no vitals taken for this visit.   Physical Exam   Musculoskeletal Exam: ***  CDAI Exam: No CDAI exam completed.  Investigation: No additional findings.Needs PLQ eye exam.  CBC Latest Ref Rng & Units 08/19/2016 07/01/2016 05/26/2016  WBC 3.8 - 10.8 K/uL 3.0(L) 5.4 5.8  Hemoglobin 11.7 - 15.5 g/dL 07.3 11.9(L) 12.3  Hematocrit 35.0 - 45.0 % 39.7 37.0 39.2  Platelets 140 - 400 K/uL 268 307 292   CMP Latest Ref Rng & Units 08/19/2016 07/01/2016 05/26/2016  Glucose 65 - 99 mg/dL 86 91 710(G)  BUN 7 - 25 mg/dL 13 15 13   Creatinine 0.50 - 1.05 mg/dL 2.69 4.85  Sodium 135 - 146 mmol/L 143 139 145  Potassium 3.5 - 5.3 mmol/L 4.3 3.9 3.7  Chloride 98 - 110  mmol/L 106 105 108  CO2 20 - 31 mmol/L 23 26 25   Calcium 8.6 - 10.4 mg/dL 8.8 9.1 8.9  Total Protein 6.1 - 8.1 g/dL 6.9 - 7.4  Total Bilirubin 0.2 - 1.2 mg/dL 0.6 - 0.6  Alkaline Phos 33 - 130 U/L 59 - 57  AST 10 - 35 U/L 14 - 16  ALT 6 - 29 U/L 10 - 11    Imaging: No results found.  Speciality Comments: No specialty comments available.    Procedures:  No procedures performed Allergies: Aspirin; Hydrocodone-acetaminophen; Ibuprofen; Imdur [isosorbide nitrate]; Meperidine hcl; Morphine; Oxycodone-acetaminophen; Procaine hcl; Toprol xl [metoprolol]; and Tramadol   Assessment / Plan:     Visit Diagnoses: No diagnosis found.    Orders: No orders of the defined types were placed in this encounter.  No orders of the defined types were placed in this encounter.   Face-to-face time spent with patient was *** minutes. 50% of time was spent in counseling and coordination of care.  Follow-Up Instructions: No Follow-up on file.   4.62, NT  Note - This record has been created using .  Chart creation errors have been sought, but may not always  have been located. Such creation errors do not reflect on  the standard of medical care.

## 2016-09-28 ENCOUNTER — Ambulatory Visit: Payer: BC Managed Care – PPO | Admitting: Rheumatology

## 2016-09-29 ENCOUNTER — Ambulatory Visit: Payer: BC Managed Care – PPO | Admitting: Rheumatology

## 2016-10-05 ENCOUNTER — Ambulatory Visit (INDEPENDENT_AMBULATORY_CARE_PROVIDER_SITE_OTHER): Payer: BC Managed Care – PPO | Admitting: Family Medicine

## 2016-10-05 ENCOUNTER — Encounter: Payer: Self-pay | Admitting: Family Medicine

## 2016-10-05 VITALS — BP 130/80 | HR 76 | Resp 12 | Ht 67.0 in | Wt 211.5 lb

## 2016-10-05 DIAGNOSIS — H6123 Impacted cerumen, bilateral: Secondary | ICD-10-CM | POA: Diagnosis not present

## 2016-10-05 DIAGNOSIS — H902 Conductive hearing loss, unspecified: Secondary | ICD-10-CM | POA: Diagnosis not present

## 2016-10-05 NOTE — Progress Notes (Signed)
ACUTE VISIT   HPI:  Chief Complaint  Patient presents with  . hearing loss    [name] is a 56 y.o. female, who is here today complaining of 4 days of sudden onset of hearing loss, woke up with problem. She states that she feels like her head is "under water." + Ear "popping" intermittently a few days before problem onset.  She had same symptoms about 5 years ago, according to patient, she was evaluated in the ER. Symptoms resolved after ear "flush." She doesn't use Q-tips.  Ear Fullness   There is pain in the left ear. This is a new problem. The current episode started in the past 7 days. The problem has been unchanged. There has been no fever. Associated symptoms include headaches, hearing loss and rhinorrhea. Pertinent negatives include no abdominal pain, coughing, ear discharge, rash, sore throat or vomiting. She has tried nothing for the symptoms.   Bitemporal and parietal dull/pressure pain, mild.  Denies associated visual changes,photophobia, focal deficit,or MS changes. He has history of allergies, asthma, and rhinitis (attributed to lupus). Currently she is not on OTC antihistaminic or intranasal steroid.  She has not tried any OTC medication. She avoid Q tips in her ears.  Denies recent URI,travel, or sick contact.  Problem is otherwise stable.   Review of Systems  Constitutional: Negative for activity change, appetite change, fatigue and fever.  HENT: Positive for hearing loss and rhinorrhea. Negative for ear discharge, ear pain, mouth sores, nosebleeds, sinus pressure, sore throat, tinnitus, trouble swallowing and voice change.   Eyes: Negative for redness and visual disturbance.  Respiratory: Negative for cough, shortness of breath and wheezing.   Gastrointestinal: Negative for abdominal pain, nausea and vomiting.       No changes in bowel habits.  Musculoskeletal: Negative for gait problem and myalgias.  Skin: Negative for pallor and  rash.  Allergic/Immunologic: Positive for environmental allergies.  Neurological: Positive for headaches. Negative for dizziness, syncope, facial asymmetry and weakness.  Hematological: Negative for adenopathy. Does not bruise/bleed easily.  Psychiatric/Behavioral: Negative for confusion. The patient is nervous/anxious.       Current Outpatient Prescriptions on File Prior to Visit  Medication Sig Dispense Refill  . albuterol (PROVENTIL HFA;VENTOLIN HFA) 108 (90 BASE) MCG/ACT inhaler Inhale 2 puffs into the lungs every 6 (six) hours as needed for wheezing or shortness of breath.    . folic acid (FOLVITE) 1 MG tablet Take 1 tablet (1 mg total) by mouth 2 (two) times daily. 180 tablet 4  . hydroxychloroquine (PLAQUENIL) 200 MG tablet Take 1 tablet (200 mg total) by mouth 2 (two) times daily. 180 tablet 1  . levalbuterol (XOPENEX HFA) 45 MCG/ACT inhaler Inhale 1-2 puffs into the lungs every 4 (four) hours as needed for wheezing or shortness of breath.     . methocarbamol (ROBAXIN) 500 MG tablet Take 1 tablet (500 mg total) by mouth 2 (two) times daily as needed for muscle spasms. 60 tablet 2  . methotrexate 50 MG/2ML injection Inject 0.8 mLs (20 mg total) into the skin once a week. 10 mL 0  . tiZANidine (ZANAFLEX) 4 MG tablet Take 1 tablet (4 mg total) by mouth at bedtime. 30 tablet 2  . Tuberculin-Allergy Syringes (ALLERGY SYRINGE 1CC/27GX1/2") 27G X 1/2" 1 ML MISC Patient to use to inject MTX weekly 12 each 3  . valACYclovir (VALTREX) 1000 MG tablet Take 1 tablet (1,000 mg total) by mouth 3 (three) times daily. 21 tablet 0  No current facility-administered medications on file prior to visit.      Past Medical History:  Diagnosis Date  . Anemia   . Asthma   . GERD (gastroesophageal reflux disease)   . History of stomach ulcers   . HTN (hypertension)   . Lupus   . MI (myocardial infarction) (HCC)    2013  . Rheumatoid arthritis (HCC)   . Thyroid mass   . Vertigo    Allergies    Allergen Reactions  . Aspirin Other (See Comments)    GI bleed  . Hydrocodone-Acetaminophen Hives and Nausea And Vomiting  . Ibuprofen Other (See Comments)    GI bleed  . Imdur [Isosorbide Nitrate] Other (See Comments)    Reaction unknown  . Meperidine Hcl Hives and Nausea And Vomiting    Short term memory loss  . Morphine Hives and Nausea And Vomiting  . Oxycodone-Acetaminophen Hives and Nausea And Vomiting    Reaction unknown  . Procaine Hcl Other (See Comments)    Ineffective  . Toprol Xl [Metoprolol] Other (See Comments)    Reaction unknown  . Tramadol Itching    Social History   Social History  . Marital status: Married    Spouse name: N/A  . Number of children: 4  . Years of education: N/A   Occupational History  . TEACHER ASSISTANT [employer]   Social History Main Topics  . Smoking status: Never Smoker  . Smokeless tobacco: Never Used  . Alcohol use No  . Drug use: No  . Sexual activity: Yes    Birth control/ protection: None, Surgical     Comment: LAVH   Other Topics Concern  . None   Social History Narrative  . None    Vitals:   10/05/16 1345  BP: 130/80  Pulse: 76  Resp: 12   Body mass index is 33.13 kg/m.   Physical Exam  Nursing note and vitals reviewed. Constitutional: She is oriented to person, place, and time. She appears well-developed. She does not appear ill. No distress.  HENT:  Head: Normocephalic and atraumatic.  Right Ear: External ear normal.  Left Ear: External ear normal.  Nose: Right sinus exhibits no maxillary sinus tenderness and no frontal sinus tenderness. Left sinus exhibits no maxillary sinus tenderness and no frontal sinus tenderness.  Mouth/Throat: Oropharynx is clear and moist and mucous membranes are normal.  Cerumen excess bilateral, I couldn't see TM. Hypertrophic turbinates.  Eyes: Conjunctivae are normal.  Cardiovascular: Normal rate and regular rhythm.   No murmur heard. Respiratory: Effort  normal and breath sounds normal. No respiratory distress.  Lymphadenopathy:       Head (right side): No preauricular and no posterior auricular adenopathy present.       Head (left side): No preauricular and no posterior auricular adenopathy present.    She has no cervical adenopathy.  Neurological: She is alert and oriented to person, place, and time. She has normal strength. Coordination and gait normal.  Skin: Skin is warm. No erythema.  Psychiatric: Her mood appears anxious.  Well groomed, good eye contact.    ASSESSMENT AND PLAN:   [name] was seen today for hearing loss.  Diagnoses and all orders for this visit:  Conductive hearing loss, unspecified laterality  Cerumen most likely. ? Associated eustachian tube dysfunction.  Bilateral impacted cerumen  Ear lavage done but still some cerumen in ears, L>R. I removed some cerumen with curette but some cerumen is deep and she did not tolerate  it. No complications. Recommend using OTC Debrox or Docusate Sodium cap content ,2-3 drops in ears for 7-10 days and follow up in 10 days.   Return in about 10 days (around 10/15/2016) for hearing loss.   15 min face to face OV. > 50% was dedicated to counseling Dx, prognosis, treatment options, and risks with ear lavage. Also educated about warning signs.   -[name] was advised to seek immediate medical attention if sudden worsening symptoms.     [name]. [place], MD  [facility]. [place] office.

## 2016-10-05 NOTE — Patient Instructions (Addendum)
A few things to remember from today's visit:   Conductive hearing loss, unspecified laterality  Bilateral impacted cerumen  Over-the-counter Docusate sodium ,open capsule and apply 2-3 drops in each ear. Place a cotton ball to prevent liquid to come out.  Debrox otic drops over the counter may also help.  Avoid q tips.  Please follow in 7-10 days if not any better, so we can try ear wash again.  Ear Irrigation What is ear irrigation? Ear irrigation is a procedure to wash dirt and wax out of your ear canal. This procedure is also called lavage. You may need ear irrigation if you are having trouble hearing because of a buildup of earwax. You may also have ear irrigation as part of the treatment for an ear infection. Getting wax and dirt out of your ear canal can help some medicines given as ear drops work better. How is ear irrigation performed? The procedure may vary among health care providers and hospitals. You may be given ear drops to put in your ear 15-20 minutes before irrigation. This helps loosen the wax. Then, a syringe containing water and a sterile salt solution (saline) can be gently inserted into the ear canal. The saline is used to flush out wax and other debris. Ear irrigation kits are also available to use at home. Ask your health care provider if this is an option for you. Use a home irrigation kit only as told by your health care provider. Read the package instructions carefully. Follow the directions for using the syringe. Use water that is room temperature. Do not do ear irrigation at home if you:  Have diabetes. Diabetes increases the risk of infection.  Have a hole or tear in your eardrum.  Have tubes in your ears.  What are the risks of ear irrigation? Generally, this is a safe procedure. However, problems may occur, including:  Infection.  Pain.  Hearing loss.  Pushing water and debris into the eardrum. This can occur if there are holes in the  eardrum.  Ear irrigation failing to work.  How should I care for my ears after having them irrigated? Cleaning  Clean the outside of your ear with a soft washcloth daily.  If told by your health care provider, use a few drops of baby oil, mineral oil, glycerin, hydrogen peroxide, or over-the-counter earwax softening drops.  Do not use cotton swabs to clean your ears. These can push wax down into the ear canal.  Do not put anything into your ears to try to remove wax. This includes ear candles. General Instructions  Take over-the-counter and prescription medicines only as told by your health care provider.  If you were prescribed an antibiotic medicine, use it as told by your health care provider. Do not stop using the antibiotic even if your condition improves.  Keep all follow-up visits as told by your health care provider. This is important.  Visit your health care provider at least once a year to have your ears and hearing checked. When should I seek medical care? Seek medical care if:  Your hearing is not improving or is getting worse.  You have pain or redness in your ear.  You have fluid, blood, or pus coming out of your ear.  This information is not intended to replace advice given to you by your health care provider. Make sure you discuss any questions you have with your health care provider. Document Released: 02/01/2015 Document Revised: 12/02/2015 Document Reviewed: 06/14/2014 Elsevier Interactive Patient  Education  2017 [place].    Please be sure medication list is accurate. If a new problem present, please set up appointment sooner than planned today.

## 2016-10-08 ENCOUNTER — Telehealth: Payer: Self-pay | Admitting: Rheumatology

## 2016-10-08 MED ORDER — DICLOFENAC SODIUM 1 % TD GEL: TRANSDERMAL | 3 refills | Status: DC

## 2016-10-08 NOTE — Telephone Encounter (Signed)
Patient states she has lost 15-20 pounds in the last 2 weeks. Patient has not been seen at her PCP. Patient has not started any new medications from our office. Patient will follow up with PCP first. Patient state she did not get her prescription for Voltaren Gel at her last visit. See. Office note on 08/28/16, will send in prescription.

## 2016-10-08 NOTE — Telephone Encounter (Signed)
Attempted to contact the patient and left message for patient to call the office.  

## 2016-10-08 NOTE — Telephone Encounter (Signed)
Patient calling in reference to weight. Patient has lost about 15-20 pounds in 2 weeks without trying. Patient starting taking medicine Mr. Leane Call prescribed on 9/15. Please call patient to advise.

## 2016-10-12 ENCOUNTER — Telehealth: Payer: Self-pay

## 2016-10-12 NOTE — Telephone Encounter (Signed)
A prior authorization for Voltaren Gel has been submitted to patient's insurance via cover my meds. Will update once we receive a response.   Ormond Lazo, [place], CPhT 9:43 AM

## 2016-10-12 NOTE — Telephone Encounter (Signed)
Received a fax from [facility] regarding a prior authorization DENIAL for Diclofenac Gel.   Phone number:[phone]  Will send document to scan center.  Called patient to update her. Left message  Abran Duke, CPhT  4:23 PM

## 2016-10-20 ENCOUNTER — Telehealth: Payer: Self-pay | Admitting: Rheumatology

## 2016-10-20 NOTE — Telephone Encounter (Signed)
Patient states she is having a flare and is requesting a prescription for prednisone to be sent to the pharmacy. Patient is scheduled for 10/22/16 with Mr. Leane Call for an appointment. She has been taking a muscle relaxer for the past two weeks and has not gotten any relief.   Patient is also requesting a refill of PLQ to be [facility] to [facility] [address]. Patient states she has been out for over a month now and thinks this is the cause of her flare.

## 2016-10-20 NOTE — Telephone Encounter (Signed)
Patient left a message requesting Sue Lush to call her back. Did not leave any other information.

## 2016-10-21 NOTE — Telephone Encounter (Signed)
Spoke with patient and she states she has not had her PLQ eye exam as of yet. Last one we have on file is 03/2014. Patient advised per last office note we are unable to refill her PLQ until she gets her PLQ eye exam. Patient states she has not had the copay for the exam which is $100. Patient states she will call them this morning to see if they will bill her for this so she can go ahead and have that done. Patient will wait until tomorrow for her follow up appointment to be evaluated for her flare.

## 2016-10-21 NOTE — Telephone Encounter (Signed)
See previous phone note.  

## 2016-10-22 ENCOUNTER — Ambulatory Visit (INDEPENDENT_AMBULATORY_CARE_PROVIDER_SITE_OTHER): Payer: BC Managed Care – PPO | Admitting: Rheumatology

## 2016-10-22 ENCOUNTER — Encounter: Payer: Self-pay | Admitting: Rheumatology

## 2016-10-22 VITALS — BP 130/91 | HR 71 | Ht 66.0 in | Wt 212.0 lb

## 2016-10-22 DIAGNOSIS — M62838 Other muscle spasm: Secondary | ICD-10-CM

## 2016-10-22 DIAGNOSIS — M359 Systemic involvement of connective tissue, unspecified: Secondary | ICD-10-CM

## 2016-10-22 DIAGNOSIS — Z79899 Other long term (current) drug therapy: Secondary | ICD-10-CM | POA: Diagnosis not present

## 2016-10-22 DIAGNOSIS — F5101 Primary insomnia: Secondary | ICD-10-CM

## 2016-10-22 DIAGNOSIS — M7061 Trochanteric bursitis, right hip: Secondary | ICD-10-CM

## 2016-10-22 DIAGNOSIS — R5382 Chronic fatigue, unspecified: Secondary | ICD-10-CM | POA: Diagnosis not present

## 2016-10-22 DIAGNOSIS — M7062 Trochanteric bursitis, left hip: Secondary | ICD-10-CM | POA: Diagnosis not present

## 2016-10-22 DIAGNOSIS — D8989 Other specified disorders involving the immune mechanism, not elsewhere classified: Secondary | ICD-10-CM

## 2016-10-22 DIAGNOSIS — M797 Fibromyalgia: Secondary | ICD-10-CM | POA: Diagnosis not present

## 2016-10-22 MED ORDER — LIDOCAINE HCL 1 % IJ SOLN
0.5000 mL | INTRAMUSCULAR | Status: AC | PRN
  Administered 2016-10-22: .5 mL

## 2016-10-22 MED ORDER — LIDOCAINE HCL 1 % IJ SOLN
1.5000 mL | INTRAMUSCULAR | Status: AC | PRN
  Administered 2016-10-22: 1.5 mL

## 2016-10-22 MED ORDER — TRIAMCINOLONE ACETONIDE 40 MG/ML IJ SUSP
40.0000 mg | INTRAMUSCULAR | Status: AC | PRN
  Administered 2016-10-22: 40 mg via INTRA_ARTICULAR
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
## 2016-10-23 ENCOUNTER — Telehealth: Payer: Self-pay | Admitting: Rheumatology

## 2016-10-23 NOTE — Telephone Encounter (Signed)
Patient called to request refills of PLQ and cream (Voltaren?) to be sent to [facility] pharmacy on [address]. She states Mr. Leane Call gave her coupons to use at [facility] for those prescriptions.

## 2016-10-23 NOTE — Telephone Encounter (Signed)
Patient advised prescription was sent to the pharmacy on 10/08/16.Patient advised she may contact [facility] and have them transfer the prescription. Patient advised unable to send in PLQ until PLQ eye exam.

## 2016-10-26 LAB — URINALYSIS, ROUTINE W REFLEX MICROSCOPIC
Bilirubin Urine: NEGATIVE
Glucose, UA: NEGATIVE
Hgb urine dipstick: NEGATIVE
Hyaline Cast: NONE SEEN /LPF
KETONES UR: NEGATIVE
Nitrite: NEGATIVE
PH: 7 (ref 5.0–8.0)
Protein, ur: NEGATIVE
SPECIFIC GRAVITY, URINE: 1.017 (ref 1.001–1.03)
Squamous Epithelial / LPF: NONE SEEN /HPF (ref <=5)

## 2016-10-26 LAB — COMPLETE METABOLIC PANEL WITH GFR
AG Ratio: 1.3 (calc) (ref 1.0–2.5)
ALBUMIN MSPROF: 4.1 g/dL (ref 3.6–5.1)
ALKALINE PHOSPHATASE (APISO): 56 U/L (ref 33–130)
ALT: 9 U/L (ref 6–29)
AST: 14 U/L (ref 10–35)
BILIRUBIN TOTAL: 0.7 mg/dL (ref 0.2–1.2)
BUN: 12 mg/dL (ref 7–25)
CO2: 29 mmol/L (ref 20–32)
Calcium: 9.3 mg/dL (ref 8.6–10.4)
Chloride: 103 mmol/L (ref 98–110)
Creat: 0.74 mg/dL (ref 0.50–1.05)
GFR, Est African American: 106 mL/min/{1.73_m2} (ref >=60)
GFR, Est Non African American: 92 mL/min/{1.73_m2} (ref >=60)
GLOBULIN: 3.2 g/dL (ref 1.9–3.7)
GLUCOSE: 87 mg/dL (ref 65–99)
POTASSIUM: 4.3 mmol/L (ref 3.5–5.3)
Sodium: 140 mmol/L (ref 135–146)
Total Protein: 7.3 g/dL (ref 6.1–8.1)

## 2016-10-26 LAB — PROTEIN ELECTROPHORESIS, SERUM
ALBUMIN ELP: 4 g/dL (ref 3.8–4.8)
ALPHA 2: 0.7 g/dL (ref 0.5–0.9)
Alpha 1: 0.3 g/dL (ref 0.2–0.3)
BETA 2: 0.5 g/dL (ref 0.2–0.5)
BETA GLOBULIN: 0.5 g/dL (ref 0.4–0.6)
Gamma Globulin: 1.3 g/dL (ref 0.8–1.7)
TOTAL PROTEIN: 7.4 g/dL (ref 6.1–8.1)

## 2016-10-26 LAB — CBC WITH DIFFERENTIAL/PLATELET
BASOS ABS: 40 {cells}/uL (ref 0–200)
Basophils Relative: 0.9 %
EOS ABS: 62 {cells}/uL (ref 15–500)
EOS PCT: 1.4 %
HEMATOCRIT: 38.7 % (ref 35.0–45.0)
HEMOGLOBIN: 12.4 g/dL (ref 11.7–15.5)
Lymphs Abs: 950 cells/uL (ref 850–3900)
MCH: 27.6 pg (ref 27.0–33.0)
MCHC: 32 g/dL (ref 32.0–36.0)
MCV: 86.2 fL (ref 80.0–100.0)
MONOS PCT: 4.6 %
MPV: 10.9 fL (ref 7.5–12.5)
NEUTROS ABS: 3146 {cells}/uL (ref 1500–7800)
Neutrophils Relative %: 71.5 %
Platelets: 276 10*3/uL (ref 140–400)
RBC: 4.49 10*6/uL (ref 3.80–5.10)
RDW: 13.6 % (ref 11.0–15.0)
Total Lymphocyte: 21.6 %
WBC mixed population: 202 cells/uL (ref 200–950)
WBC: 4.4 10*3/uL (ref 3.8–10.8)

## 2016-10-26 LAB — SEDIMENTATION RATE: Sed Rate: 24 mm/h (ref 0–30)

## 2016-10-26 LAB — ANTI-NUCLEAR AB-TITER (ANA TITER): ANA Titer 1: >=1:1280 {titer} — AB

## 2016-10-26 LAB — C3 AND C4
C3 COMPLEMENT: 170 mg/dL (ref 83–193)
C4 Complement: 38 mg/dL (ref 15–57)

## 2016-10-26 LAB — ANA: ANA: POSITIVE — AB

## 2016-10-28 ENCOUNTER — Telehealth: Payer: Self-pay | Admitting: Radiology

## 2016-10-28 NOTE — Telephone Encounter (Signed)
Called pt to advise / left message for her to call back
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
## 2016-12-22 ENCOUNTER — Other Ambulatory Visit: Payer: Self-pay | Admitting: *Deleted

## 2016-12-22 DIAGNOSIS — Z79899 Other long term (current) drug therapy: Secondary | ICD-10-CM

## 2016-12-22 LAB — CBC WITH DIFFERENTIAL/PLATELET
BASOS PCT: 1.2 %
Basophils Absolute: 42 cells/uL (ref 0–200)
EOS ABS: 49 {cells}/uL (ref 15–500)
Eosinophils Relative: 1.4 %
HEMATOCRIT: 38.2 % (ref 35.0–45.0)
HEMOGLOBIN: 12.5 g/dL (ref 11.7–15.5)
Lymphs Abs: 921 cells/uL (ref 850–3900)
MCH: 27.4 pg (ref 27.0–33.0)
MCHC: 32.7 g/dL (ref 32.0–36.0)
MCV: 83.8 fL (ref 80.0–100.0)
MPV: 10.6 fL (ref 7.5–12.5)
Monocytes Relative: 6.6 %
NEUTROS PCT: 64.5 %
Neutro Abs: 2258 cells/uL (ref 1500–7800)
Platelets: 266 10*3/uL (ref 140–400)
RBC: 4.56 10*6/uL (ref 3.80–5.10)
RDW: 12.9 % (ref 11.0–15.0)
TOTAL LYMPHOCYTE: 26.3 %
WBC: 3.5 10*3/uL — AB (ref 3.8–10.8)
WBCMIX: 231 {cells}/uL (ref 200–950)

## 2016-12-22 LAB — COMPLETE METABOLIC PANEL WITH GFR
AG Ratio: 1.3 (calc) (ref 1.0–2.5)
ALKALINE PHOSPHATASE (APISO): 57 U/L (ref 33–130)
ALT: 13 U/L (ref 6–29)
AST: 17 U/L (ref 10–35)
Albumin: 4.1 g/dL (ref 3.6–5.1)
BUN: 12 mg/dL (ref 7–25)
CO2: 29 mmol/L (ref 20–32)
CREATININE: 0.73 mg/dL (ref 0.50–1.05)
Calcium: 9.3 mg/dL (ref 8.6–10.4)
Chloride: 104 mmol/L (ref 98–110)
GFR, Est African American: 108 mL/min/{1.73_m2} (ref >=60)
GFR, Est Non African American: 93 mL/min/{1.73_m2} (ref >=60)
Globulin: 3.2 g/dL (calc) (ref 1.9–3.7)
Glucose, Bld: 81 mg/dL (ref 65–99)
Potassium: 3.8 mmol/L (ref 3.5–5.3)
Sodium: 142 mmol/L (ref 135–146)
Total Bilirubin: 0.7 mg/dL (ref 0.2–1.2)
Total Protein: 7.3 g/dL (ref 6.1–8.1)

## 2016-12-23 NOTE — Progress Notes (Signed)
Labs are stable. WBC is low. We will continue to monitor.

## 2016-12-30 ENCOUNTER — Encounter (HOSPITAL_COMMUNITY): Payer: Self-pay | Admitting: *Deleted

## 2016-12-30 ENCOUNTER — Emergency Department (HOSPITAL_COMMUNITY)
Admission: EM | Admit: 2016-12-30 | Discharge: 2016-12-31 | Disposition: A | Discharge status: Home / Self Care | Payer: BC Managed Care – PPO | Attending: Emergency Medicine | Admitting: Emergency Medicine

## 2016-12-30 DIAGNOSIS — Z79899 Other long term (current) drug therapy: Secondary | ICD-10-CM | POA: Diagnosis not present

## 2016-12-30 DIAGNOSIS — R112 Nausea with vomiting, unspecified: Secondary | ICD-10-CM | POA: Insufficient documentation

## 2016-12-30 DIAGNOSIS — R51 Headache: Secondary | ICD-10-CM | POA: Diagnosis not present

## 2016-12-30 DIAGNOSIS — I10 Essential (primary) hypertension: Secondary | ICD-10-CM | POA: Insufficient documentation

## 2016-12-30 DIAGNOSIS — R519 Headache, unspecified: Secondary | ICD-10-CM

## 2016-12-30 LAB — URINALYSIS, ROUTINE W REFLEX MICROSCOPIC
BILIRUBIN URINE: NEGATIVE
Glucose, UA: NEGATIVE mg/dL
HGB URINE DIPSTICK: NEGATIVE
Ketones, ur: 20 mg/dL — AB
Leukocytes, UA: NEGATIVE
NITRITE: NEGATIVE
PH: 6 (ref 5.0–8.0)
Protein, ur: NEGATIVE mg/dL
SPECIFIC GRAVITY, URINE: 1.023 (ref 1.005–1.030)

## 2016-12-30 LAB — COMPREHENSIVE METABOLIC PANEL
ALBUMIN: 4.3 g/dL (ref 3.5–5.0)
ALT: 18 U/L (ref 14–54)
ANION GAP: 11 (ref 5–15)
AST: 19 U/L (ref 15–41)
Alkaline Phosphatase: 65 U/L (ref 38–126)
BUN: 11 mg/dL (ref 6–20)
CALCIUM: 9.5 mg/dL (ref 8.9–10.3)
CO2: 24 mmol/L (ref 22–32)
Chloride: 104 mmol/L (ref 101–111)
Creatinine, Ser: 0.62 mg/dL (ref 0.44–1.00)
GFR calc non Af Amer: >60 mL/min (ref >60)
GLUCOSE: 91 mg/dL (ref 65–99)
POTASSIUM: 3.4 mmol/L — AB (ref 3.5–5.1)
SODIUM: 139 mmol/L (ref 135–145)
Total Bilirubin: 0.9 mg/dL (ref 0.3–1.2)
Total Protein: 8.5 g/dL — ABNORMAL HIGH (ref 6.5–8.1)

## 2016-12-30 LAB — CBC
HEMATOCRIT: 43 % (ref 36.0–46.0)
HEMOGLOBIN: 13.8 g/dL (ref 12.0–15.0)
MCH: 28.2 pg (ref 26.0–34.0)
MCHC: 32.1 g/dL (ref 30.0–36.0)
MCV: 87.8 fL (ref 78.0–100.0)
Platelets: 272 10*3/uL (ref 150–400)
RBC: 4.9 MIL/uL (ref 3.87–5.11)
RDW: 14.1 % (ref 11.5–15.5)
WBC: 4.5 10*3/uL (ref 4.0–10.5)

## 2016-12-30 LAB — I-STAT BETA HCG BLOOD, ED (MC, WL, AP ONLY)

## 2016-12-30 LAB — LIPASE, BLOOD: LIPASE: 23 U/L (ref 11–51)

## 2016-12-30 MED ORDER — GI COCKTAIL ~~LOC~~
30.0000 mL | Freq: Once | ORAL | Status: AC
  Administered 2016-12-31: 30 mL via ORAL
  Filled 2016-12-30: qty 30

## 2016-12-30 MED ORDER — DICYCLOMINE HCL 10 MG/ML IM SOLN
20.0000 mg | Freq: Once | INTRAMUSCULAR | Status: AC
  Administered 2016-12-31: 20 mg via INTRAMUSCULAR
  Filled 2016-12-30: qty 2

## 2016-12-30 MED ORDER — ONDANSETRON 8 MG PO TBDP
8.0000 mg | ORAL_TABLET | Freq: Once | ORAL | Status: AC
  Administered 2016-12-31: 8 mg via ORAL
  Filled 2016-12-30: qty 1

## 2016-12-30 NOTE — ED Triage Notes (Signed)
Pt complains of emesis and abdominal pain since yesterday. Abdominal pain started after the vomiting. Pt denies diarrhea, fever.  Pt has hx of lupus.

## 2016-12-31 ENCOUNTER — Emergency Department (HOSPITAL_COMMUNITY): Payer: BC Managed Care – PPO

## 2016-12-31 MED ORDER — DIPHENHYDRAMINE HCL 50 MG/ML IJ SOLN
12.5000 mg | Freq: Once | INTRAMUSCULAR | Status: AC
  Administered 2016-12-31: 12.5 mg via INTRAVENOUS
  Filled 2016-12-31: qty 1

## 2016-12-31 MED ORDER — FAMOTIDINE IN NACL 20-0.9 MG/50ML-% IV SOLN
20.0000 mg | Freq: Once | INTRAVENOUS | Status: AC
  Administered 2016-12-31: 20 mg via INTRAVENOUS
  Filled 2016-12-31: qty 50

## 2016-12-31 MED ORDER — KETOROLAC TROMETHAMINE 30 MG/ML IJ SOLN
15.0000 mg | Freq: Once | INTRAMUSCULAR | Status: AC
  Administered 2016-12-31: 15 mg via INTRAVENOUS
  Filled 2016-12-31: qty 1

## 2016-12-31 MED ORDER — ONDANSETRON HCL 4 MG/2ML IJ SOLN
4.0000 mg | Freq: Once | INTRAMUSCULAR | Status: DC
Start: 2016-12-31 — End: 2016-12-31

## 2016-12-31 MED ORDER — SODIUM CHLORIDE 0.9 % IV BOLUS (SEPSIS)
500.0000 mL | Freq: Once | INTRAVENOUS | Status: AC
  Administered 2016-12-31: 500 mL via INTRAVENOUS

## 2016-12-31 MED ORDER — ONDANSETRON 8 MG PO TBDP: ORAL_TABLET | ORAL | 0 refills | Status: DC

## 2016-12-31 MED ORDER — METOCLOPRAMIDE HCL 5 MG/ML IJ SOLN
10.0000 mg | Freq: Once | INTRAMUSCULAR | Status: AC
  Administered 2016-12-31: 10 mg via INTRAVENOUS
  Filled 2016-12-31: qty 2

## 2016-12-31 NOTE — ED Notes (Signed)
Pt given sprite for PO challenge

## 2016-12-31 NOTE — ED Notes (Signed)
Pt took a few sips of water and started vomiting again.

## 2016-12-31 NOTE — ED Provider Notes (Signed)
[facility]-EMERGENCY DEPT Provider Note   CSN: [id number] Arrival date & time: 12/30/16  1649     History   Chief Complaint Chief Complaint  Patient presents with  . Emesis  . Abdominal Pain    HPI Patricia Reed is a 56 y.o. female.  The history is provided by the patient.  Emesis   This is a new problem. The current episode started 6 to 12 hours ago. The problem occurs 2 to 4 times per day. The problem has not changed since onset.The emesis has an appearance of stomach contents. There has been no fever. Pertinent negatives include no abdominal pain, no arthralgias, no diarrhea and no fever. Associated symptoms comments: Frontal HA. Risk factors: none.  Patient is sleeping in the room upon entrance.  She told triage about abdominal pain but is denying this to me.  Her concern is the 3 episodes of vomiting and the headache.  No f/c/r.  No neck pain or stiffness no rashes on the skin.  Denies food ingestion or sick contacts.    Past Medical History:  Diagnosis Date  . Anemia   . Asthma   . GERD (gastroesophageal reflux disease)   . History of stomach ulcers   . HTN (hypertension)   . Lupus   . MI (myocardial infarction) (HCC)    2013  . Rheumatoid arthritis (HCC)   . Thyroid mass   . Vertigo     Patient Active Problem List   Diagnosis Date Noted  . Situational anxiety 06/08/2016  . Atypical chest pain 06/08/2016  . Trapezius muscle spasm 04/23/2016  . Fibromyalgia 02/13/2016  . 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 Chronic fatigue 02/13/2016  . Asthma 11/25/2015  . Vitamin D deficiency 11/25/2015  . Autoimmune disease (HCC) 11/23/2015  . High risk medication use 11/23/2015  . Colon cancer screening 01/25/2014  . Anterior neck pain 12/01/2013  . Inflammatory arthritis 08/18/2012  . Allergic rhinitis 06/23/2012  . HTN (hypertension) 06/23/2012  . High cholesterol 06/23/2012  . Thyroid nodule - benign 06/23/2012  . Mild intermittent asthma     . Sickle cell trait (HCC)   . History of stomach ulcers   . ESOPHAGEAL STRICTURE 10/04/2007  . GERD 10/04/2007  . GASTRITIS, CHRONIC 10/04/2007  . Dysphagia, pharyngoesophageal phase 10/04/2007    Past Surgical History:  Procedure Laterality Date  . ABDOMINAL HYSTERECTOMY     done for menorrhagia, ovaries remain  . CERVICAL DISCECTOMY     plates and screws in neck  . CESAREAN SECTION    . COLONOSCOPY    . ESOPHAGEAL MANOMETRY N/A 02/12/2014   Procedure: ESOPHAGEAL MANOMETRY (EM);  Surgeon: Louis Meckel, MD;  Location: [facility];  Service: Endoscopy;  Laterality: N/A;  . ESOPHAGOGASTRODUODENOSCOPY  03/04/2011   Procedure: ESOPHAGOGASTRODUODENOSCOPY (EGD);  Surgeon: Louis Meckel, MD;  Location: [facility];  Service: Endoscopy;  Laterality: N/A;  BOTOX Injection  . exc benign breast lump    . TONSILLECTOMY    . UPPER GASTROINTESTINAL ENDOSCOPY      OB History    Gravida Para Term Preterm AB Living   5 3 3          SAB TAB Ectopic Multiple Live Births                   Home Medications    Prior to Admission medications   Medication Sig Start Date End Date Taking? Authorizing Provider  acetaminophen (TYLENOL) 650 MG CR tablet Take 650 mg by  mouth every 8 (eight) hours as needed for pain.   Yes [provider]  albuterol (PROVENTIL HFA;VENTOLIN HFA) 108 (90 BASE) MCG/ACT inhaler Inhale 2 puffs into the lungs every 6 (six) hours as needed for wheezing or shortness of breath.   Yes [provider]  folic acid (FOLVITE) 1 MG tablet Take 1 tablet (1 mg total) by mouth 2 (two) times daily. 04/10/16 07/04/17 Yes Panwala, Naitik, PA-C  hydroxychloroquine (PLAQUENIL) 200 MG tablet Take 200 mg by mouth 2 (two) times daily.   Yes [provider]  levalbuterol (XOPENEX HFA) 45 MCG/ACT inhaler Inhale 1-2 puffs into the lungs every 4 (four) hours as needed for wheezing or shortness of breath.    Yes [provider]  methotrexate 50 MG/2ML injection  Inject 0.8 mLs (20 mg total) into the skin once a week. 08/28/16  Yes Panwala, Naitik, PA-C  ondansetron (ZOFRAN ODT) 8 MG disintegrating tablet 8mg  ODT q8 hours prn nausea 12/31/16   Venice Liz, MD  Tuberculin-Allergy Syringes (ALLERGY SYRINGE 1CC/27GX1/2") 27G X 1/2" 1 ML MISC Patient to use to inject MTX weekly 08/21/16   Pollyann Savoy, MD    Family History Family History  Problem Relation Age of Onset  . Alcohol abuse Father   . Hypertension Father   . Heart disease Father   . Colon cancer Maternal Uncle   . Diabetes Maternal Grandmother   . Colon polyps Neg Hx   . Esophageal cancer Neg Hx   . Kidney disease Neg Hx   . Stomach cancer Neg Hx   . Rectal cancer Neg Hx     Social History Social History   Tobacco Use  . Smoking status: Never Smoker  . Smokeless tobacco: Never Used  Substance Use Topics  . Alcohol use: No    Alcohol/week: 0.0 oz  . Drug use: No     Allergies   Aspirin; Hydrocodone-acetaminophen; Ibuprofen; Imdur [isosorbide nitrate]; Meperidine hcl; Morphine; Oxycodone-acetaminophen; Procaine hcl; Toprol xl [metoprolol]; and Tramadol   Review of Systems Review of Systems  Constitutional: Negative for fever.  Eyes: Negative for visual disturbance.  Respiratory: Negative for shortness of breath.   Cardiovascular: Negative for chest pain.  Gastrointestinal: Positive for vomiting. Negative for abdominal pain, constipation and diarrhea.  Genitourinary: Negative for dysuria and flank pain.  Musculoskeletal: Negative for arthralgias, neck pain and neck stiffness.  All other systems reviewed and are negative.    Physical Exam Updated Vital Signs BP 128/81   Pulse 73   Temp 98.5 F (36.9 C) (Oral)   Resp 16   SpO2 99%   Physical Exam  Constitutional: She is oriented to person, place, and time. She appears well-developed and well-nourished. No distress.  Resting comfortably in the room with all the lights on  HENT:  Head: Normocephalic and  atraumatic.  Mouth/Throat: No oropharyngeal exudate.  Eyes: Conjunctivae and EOM are normal. Pupils are equal, round, and reactive to light.  Neck: Normal range of motion. Neck supple.  No meningismus intact cognition, no proptosis  Cardiovascular: Normal rate, regular rhythm, normal heart sounds and intact distal pulses.  Pulmonary/Chest: Effort normal and breath sounds normal. No stridor. She has no wheezes. She has no rales.  Abdominal: Soft. Bowel sounds are normal. She exhibits no mass. There is no tenderness. There is no rebound, no guarding, no tenderness at McBurney's point and negative Murphy's sign.  Musculoskeletal: Normal range of motion.  Lymphadenopathy:    She has no cervical adenopathy.  Neurological: She is alert and  oriented to person, place, and time. She displays normal reflexes. No cranial nerve deficit.  Skin: Skin is warm and dry. Capillary refill takes less than 2 seconds.  Psychiatric: She has a normal mood and affect.  Nursing note and vitals reviewed.    ED Treatments / Results  Labs (all labs ordered are listed, but only abnormal results are displayed)  Results for orders placed or performed during the hospital encounter of 12/30/16  Lipase, blood  Result Value Ref Range   Lipase 23 11 - 51 U/L  Comprehensive metabolic panel  Result Value Ref Range   Sodium 139 135 - 145 mmol/L   Potassium 3.4 (L) 3.5 - 5.1 mmol/L   Chloride 104 101 - 111 mmol/L   CO2 24 22 - 32 mmol/L   Glucose, Bld 91 65 - 99 mg/dL   BUN 11 6 - 20 mg/dL   Creatinine, Ser 0.99 0.44 - 1.00 mg/dL   Calcium 9.5 8.9 - 83.3 mg/dL   Total Protein 8.5 (H) 6.5 - 8.1 g/dL   Albumin 4.3 3.5 - 5.0 g/dL   AST 19 15 - 41 U/L   ALT 18 14 - 54 U/L   Alkaline Phosphatase 65 38 - 126 U/L   Total Bilirubin 0.9 0.3 - 1.2 mg/dL   GFR calc non Af Amer >60 >60 mL/min   GFR calc Af Amer >60 >60 mL/min   Anion gap 11 5 - 15  CBC  Result Value Ref Range   WBC 4.5 4.0 - 10.5 K/uL   RBC 4.90 3.87 -  5.11 MIL/uL   Hemoglobin 13.8 12.0 - 15.0 g/dL   HCT 82.5 05.3 - 97.6 %   MCV 87.8 78.0 - 100.0 fL   MCH 28.2 26.0 - 34.0 pg   MCHC 32.1 30.0 - 36.0 g/dL   RDW 73.4 19.3 - 79.0 %   Platelets 272 150 - 400 K/uL  Urinalysis, Routine w reflex microscopic  Result Value Ref Range   Color, Urine YELLOW YELLOW   APPearance CLEAR CLEAR   Specific Gravity, Urine 1.023 1.005 - 1.030   pH 6.0 5.0 - 8.0   Glucose, UA NEGATIVE NEGATIVE mg/dL   Hgb urine dipstick NEGATIVE NEGATIVE   Bilirubin Urine NEGATIVE NEGATIVE   Ketones, ur 20 (A) NEGATIVE mg/dL   Protein, ur NEGATIVE NEGATIVE mg/dL   Nitrite NEGATIVE NEGATIVE   Leukocytes, UA NEGATIVE NEGATIVE  I-Stat beta hCG blood, ED  Result Value Ref Range   I-stat hCG, quantitative <5.0 <5 mIU/mL   Comment 3           Ct Head Wo Contrast  Result Date: 12/31/2016 CLINICAL DATA:  Acute onset of severe headache. EXAM: CT HEAD WITHOUT CONTRAST TECHNIQUE: Contiguous axial images were obtained from the base of the skull through the vertex without intravenous contrast. COMPARISON:  CT of the head performed 10/11/2015 FINDINGS: Brain: No evidence of acute infarction, hemorrhage, hydrocephalus, extra-axial collection or mass lesion/mass effect. The posterior fossa, including the cerebellum, brainstem and fourth ventricle, is within normal limits. The third and lateral ventricles, and basal ganglia are unremarkable in appearance. The cerebral hemispheres are symmetric in appearance, with normal gray-white differentiation. No mass effect or midline shift is seen. Vascular: No hyperdense vessel or unexpected calcification. Skull: There is no evidence of fracture; visualized osseous structures are unremarkable in appearance. Sinuses/Orbits: The orbits are within normal limits. The paranasal sinuses and mastoid air cells are well-aerated. Other: No significant soft tissue abnormalities are seen. IMPRESSION: Unremarkable noncontrast CT  of the head. Electronically Signed    By: Roanna Raider M.D.   On: 12/31/2016 05:15    Radiology Ct Head Wo Contrast  Result Date: 12/31/2016 CLINICAL DATA:  Acute onset of severe headache. EXAM: CT HEAD WITHOUT CONTRAST TECHNIQUE: Contiguous axial images were obtained from the base of the skull through the vertex without intravenous contrast. COMPARISON:  CT of the head performed 10/11/2015 FINDINGS: Brain: No evidence of acute infarction, hemorrhage, hydrocephalus, extra-axial collection or mass lesion/mass effect. The posterior fossa, including the cerebellum, brainstem and fourth ventricle, is within normal limits. The third and lateral ventricles, and basal ganglia are unremarkable in appearance. The cerebral hemispheres are symmetric in appearance, with normal gray-white differentiation. No mass effect or midline shift is seen. Vascular: No hyperdense vessel or unexpected calcification. Skull: There is no evidence of fracture; visualized osseous structures are unremarkable in appearance. Sinuses/Orbits: The orbits are within normal limits. The paranasal sinuses and mastoid air cells are well-aerated. Other: No significant soft tissue abnormalities are seen. IMPRESSION: Unremarkable noncontrast CT of the head. Electronically Signed   By: Roanna Raider M.D.   On: 12/31/2016 05:15    Procedures Procedures (including critical care time)  Medications Ordered in ED Medications  ondansetron (ZOFRAN-ODT) disintegrating tablet 8 mg (8 mg Oral Given 12/31/16 0000)  gi cocktail (Maalox,Lidocaine,Donnatal) (30 mLs Oral Given 12/31/16 0000)  dicyclomine (BENTYL) injection 20 mg (20 mg Intramuscular Given 12/31/16 0000)  sodium chloride 0.9 % bolus 500 mL (0 mLs Intravenous Stopped 12/31/16 0412)  ketorolac (TORADOL) 30 MG/ML injection 15 mg (15 mg Intravenous Given 12/31/16 0256)  metoCLOPramide (REGLAN) injection 10 mg (10 mg Intravenous Given 12/31/16 0411)  diphenhydrAMINE (BENADRYL) injection 12.5 mg (12.5 mg Intravenous Given  12/31/16 0411)  famotidine (PEPCID) IVPB 20 mg premix (0 mg Intravenous Stopped 12/31/16 0441)       Final Clinical Impressions(s) / ED Diagnoses   Final diagnoses:  Non-intractable vomiting with nausea, unspecified vomiting type  Headache disorder   Follow up with your family doctor for recheck in 2 days.  Strict return precautions for fevers > 101, stiff neck, intractable vomiting, or diarrhea, abdominal pain, Inability to tolerate liquids or food, cough, altered mental status or any concerns. No signs of systemic illness or infection. The patient is nontoxic-appearing on exam and vital signs are within normal limits.    I have reviewed the triage vital signs and the nursing notes. Pertinent labs &imaging results that were available during my care of the patient were reviewed by me and considered in my medical decision making (see chart for details).  After history, exam, and medical workup I feel the patient has been appropriately medically screened and is safe for discharge home. Pertinent diagnoses were discussed with the patient. Patient was given return precautions    ED Discharge Orders        Ordered    ondansetron (ZOFRAN ODT) 8 MG disintegrating tablet     12/31/16 0522       Joscelyn Hardrick, MD 12/31/16 [phone]

## 2016-12-31 NOTE — ED Notes (Signed)
Pt drank the sprite provided with no issues.

## 2017-01-07 ENCOUNTER — Other Ambulatory Visit: Payer: Self-pay | Admitting: *Deleted

## 2017-01-07 ENCOUNTER — Telehealth: Payer: Self-pay

## 2017-01-07 NOTE — Telephone Encounter (Signed)
Patient would like a Rx for Prednisone sent to [facility] on [address]  Cb# is [phone].  Please advise.  Thank you

## 2017-01-08 MED ORDER — PREDNISONE 5 MG PO TABS: ORAL_TABLET | ORAL | 0 refills | Status: DC

## 2017-01-08 NOTE — Addendum Note (Signed)
Addended by: Henriette Combs on: 01/08/2017 04:45 PM   Modules accepted: Orders

## 2017-01-08 NOTE — Telephone Encounter (Signed)
Patient states she is having pain and swelling hands, feet knees, elbows and shoulders. Patient states she is having swelling and pain. Patient is on MTX 0.8 mL weekly as well PLQ 200 mg BID. Patient is requesting a prescription for Prednisone. Please advise

## 2017-01-08 NOTE — Telephone Encounter (Signed)
Attempted to contact the patient and left message for patient to call the office.  

## 2017-01-08 NOTE — Telephone Encounter (Signed)
Ok to prescribe Prednisone 20 mg, taper by 5 mg every 4 days.  If she continues to have pain and swelling after completion of the taper she needs to make an appointment to be seen.

## 2017-02-02 ENCOUNTER — Encounter: Payer: Self-pay | Admitting: Rheumatology

## 2017-02-02 ENCOUNTER — Ambulatory Visit: Payer: BC Managed Care – PPO | Admitting: Rheumatology

## 2017-02-02 VITALS — BP 140/76 | HR 82 | Resp 17 | Ht 65.0 in | Wt 210.0 lb

## 2017-02-02 DIAGNOSIS — Z8639 Personal history of other endocrine, nutritional and metabolic disease: Secondary | ICD-10-CM

## 2017-02-02 DIAGNOSIS — R5382 Chronic fatigue, unspecified: Secondary | ICD-10-CM | POA: Diagnosis not present

## 2017-02-02 DIAGNOSIS — Z8679 Personal history of other diseases of the circulatory system: Secondary | ICD-10-CM | POA: Diagnosis not present

## 2017-02-02 DIAGNOSIS — F5101 Primary insomnia: Secondary | ICD-10-CM | POA: Diagnosis not present

## 2017-02-02 DIAGNOSIS — M7061 Trochanteric bursitis, right hip: Secondary | ICD-10-CM | POA: Diagnosis not present

## 2017-02-02 DIAGNOSIS — M797 Fibromyalgia: Secondary | ICD-10-CM | POA: Diagnosis not present

## 2017-02-02 DIAGNOSIS — M7062 Trochanteric bursitis, left hip: Secondary | ICD-10-CM

## 2017-02-02 DIAGNOSIS — Z8719 Personal history of other diseases of the digestive system: Secondary | ICD-10-CM

## 2017-02-02 DIAGNOSIS — Z8709 Personal history of other diseases of the respiratory system: Secondary | ICD-10-CM

## 2017-02-02 DIAGNOSIS — M351 Other overlap syndromes: Secondary | ICD-10-CM

## 2017-02-02 DIAGNOSIS — M542 Cervicalgia: Secondary | ICD-10-CM

## 2017-02-02 DIAGNOSIS — Z79899 Other long term (current) drug therapy: Secondary | ICD-10-CM | POA: Diagnosis not present

## 2017-02-02 DIAGNOSIS — Z8711 Personal history of peptic ulcer disease: Secondary | ICD-10-CM

## 2017-02-02 MED ORDER — LIDOCAINE HCL (PF) 1 % IJ SOLN
0.5000 mL | INTRAMUSCULAR | Status: AC | PRN
  Administered 2017-02-02: .5 mL

## 2017-02-02 MED ORDER — TRIAMCINOLONE ACETONIDE 40 MG/ML IJ SUSP
10.0000 mg | INTRAMUSCULAR | Status: AC | PRN
  Administered 2017-02-02: 10 mg via INTRAMUSCULAR

## 2017-02-02 MED ORDER — LIDOCAINE HCL 1 % IJ SOLN
0.5000 mL | INTRAMUSCULAR | Status: AC | PRN
  Administered 2017-02-02: .5 mL

## 2017-02-02 NOTE — Patient Instructions (Addendum)
Iliotibial Band Syndrome Rehab Ask your health care provider which exercises are safe for you. Do exercises exactly as told by your health care provider and adjust them as directed. It is normal to feel mild stretching, pulling, tightness, or discomfort as you do these exercises, but you should stop right away if you feel sudden pain or your pain gets worse.Do not begin these exercises until told by your health care provider. Stretching and range of motion exercises These exercises warm up your muscles and joints and improve the movement and flexibility of your hip and pelvis. Exercise A: Quadriceps, prone  1. Lie on your abdomen on a firm surface, such as a bed or padded floor. 2. Bend your left / right knee and hold your ankle. If you cannot reach your ankle or pant leg, loop a belt around your foot and grab the belt instead. 3. Gently pull your heel toward your buttocks. Your knee should not slide out to the side. You should feel a stretch in the front of your thigh and knee. 4. Hold this position for __________ seconds. Repeat __________ times. Complete this stretch __________ times a day. Exercise B: Iliotibial band  1. Lie on your side with your left / right leg in the top position. 2. Bend both of your knees and grab your left / right ankle. Stretch out your bottom arm to help you balance. 3. Slowly bring your top knee back so your thigh goes behind your trunk. 4. Slowly lower your top leg toward the floor until you feel a gentle stretch on the outside of your left / right hip and thigh. If you do not feel a stretch and your knee will not fall farther, place the heel of your other foot on top of your knee and pull your knee down toward the floor with your foot. 5. Hold this position for __________ seconds. Repeat __________ times. Complete this stretch __________ times a day. Strengthening exercises These exercises build strength and endurance in your hip and pelvis. Endurance is the  ability to use your muscles for a long time, even after they get tired. Exercise C: Straight leg raises ( hip abductors) 1. Lie on your side with your left / right leg in the top position. Lie so your head, shoulder, knee, and hip line up. You may bend your bottom knee to help you balance. 2. Roll your hips slightly forward so your hips are stacked directly over each other and your left / right knee is facing forward. 3. Tense the muscles in your outer thigh and lift your top leg 4-6 inches (10-15 cm). 4. Hold this position for __________ seconds. 5. Slowly return to the starting position. Let your muscles relax completely before doing another repetition. Repeat __________ times. Complete this exercise __________ times a day. Exercise D: Straight leg raises ( hip extensors) 1. Lie on your abdomen on your bed or a firm surface. You can put a pillow under your hips if that is more comfortable. 2. Bend your left / right knee so your foot is straight up in the air. 3. Squeeze your buttock muscles and lift your left / right thigh off the bed. Do not let your back arch. 4. Tense this muscle as hard as you can without increasing any knee pain. 5. Hold this position for __________ seconds. 6. Slowly lower your leg to the starting position and allow it to relax completely. Repeat __________ times. Complete this exercise __________ times a day. Exercise E: Hip   hike 1. Stand sideways on a bottom step. Stand on your left / right leg with your other foot unsupported next to the step. You can hold onto the railing or wall if needed for balance. 2. Keep your knees straight and your torso square. Then, lift your left / right hip up toward the ceiling. 3. Slowly let your left / right hip lower toward the floor, past the starting position. Your foot should get closer to the floor. Do not lean or bend your knees. Repeat __________ times. Complete this exercise __________ times a day. This information is not  intended to replace advice given to you by your health care provider. Make sure you discuss any questions you have with your health care provider. Document Released: 01/05/2005 Document Revised: 09/10/2015 Document Reviewed: 12/07/2014 Elsevier Interactive Patient Education  2018 [place] [employer]. Cervical Strain and Sprain Rehab Ask your health care provider which exercises are safe for you. Do exercises exactly as told by your health care provider and adjust them as directed. It is normal to feel mild stretching, pulling, tightness, or discomfort as you do these exercises, but you should stop right away if you feel sudden pain or your pain gets worse.Do not begin these exercises until told by your health care provider. Stretching and range of motion exercises These exercises warm up your muscles and joints and improve the movement and flexibility of your neck. These exercises also help to relieve pain, numbness, and tingling. Exercise A: Cervical side bend  1. Using good posture, sit on a stable chair or stand up. 2. Without moving your shoulders, slowly tilt your left / right ear to your shoulder until you feel a stretch in your neck muscles. You should be looking straight ahead. 3. Hold for __________ seconds. 4. Repeat with the other side of your neck. Repeat __________ times. Complete this exercise __________ times a day. Exercise B: Cervical rotation  1. Using good posture, sit on a stable chair or stand up. 2. Slowly turn your head to the side as if you are looking over your left / right shoulder. ? Keep your eyes level with the ground. ? Stop when you feel a stretch along the side and the back of your neck. 3. Hold for __________ seconds. 4. Repeat this by turning to your other side. Repeat __________ times. Complete this exercise __________ times a day. Exercise C: Thoracic extension and pectoral stretch 1. Roll a towel or a small blanket so it is about 4 inches (10 cm) in  diameter. 2. Lie down on your back on a firm surface. 3. Put the towel lengthwise, under your spine in the middle of your back. It should not be not under your shoulder blades. The towel should line up with your spine from your middle back to your lower back. 4. Put your hands behind your head and let your elbows fall out to your sides. 5. Hold for __________ seconds. Repeat __________ times. Complete this exercise __________ times a day. Strengthening exercises These exercises build strength and endurance in your neck. Endurance is the ability to use your muscles for a long time, even after your muscles get tired. Exercise D: Upper cervical flexion, isometric 1. Lie on your back with a thin pillow behind your head and a small rolled-up towel under your neck. 2. Gently tuck your chin toward your chest and nod your head down to look toward your feet. Do not lift your head off the pillow. 3. Hold for __________ seconds. 4.  Release the tension slowly. Relax your neck muscles completely before you repeat this exercise. Repeat __________ times. Complete this exercise __________ times a day. Exercise E: Cervical extension, isometric  1. Stand about 6 inches (15 cm) away from a wall, with your back facing the wall. 2. Place a soft object, about 6-8 inches (15-20 cm) in diameter, between the back of your head and the wall. A soft object could be a small pillow, a ball, or a folded towel. 3. Gently tilt your head back and press into the soft object. Keep your jaw and forehead relaxed. 4. Hold for __________ seconds. 5. Release the tension slowly. Relax your neck muscles completely before you repeat this exercise. Repeat __________ times. Complete this exercise __________ times a day. Posture and body mechanics  Body mechanics refers to the movements and positions of your body while you do your daily activities. Posture is part of body mechanics. Good posture and healthy body mechanics can help to  relieve stress in your body's tissues and joints. Good posture means that your spine is in its natural S-curve position (your spine is neutral), your shoulders are pulled back slightly, and your head is not tipped forward. The following are general guidelines for applying improved posture and body mechanics to your everyday activities. Standing  When standing, keep your spine neutral and keep your feet about hip-width apart. Keep a slight bend in your knees. Your ears, shoulders, and hips should line up.  When you do a task in which you stand in one place for a long time, place one foot up on a stable object that is 2-4 inches (5-10 cm) high, such as a footstool. This helps keep your spine neutral. Sitting   When sitting, keep your spine neutral and your keep feet flat on the floor. Use a footrest, if necessary, and keep your thighs parallel to the floor. Avoid rounding your shoulders, and avoid tilting your head forward.  When working at a desk or a computer, keep your desk at a height where your hands are slightly lower than your elbows. Slide your chair under your desk so you are close enough to maintain good posture.  When working at a computer, place your monitor at a height where you are looking straight ahead and you do not have to tilt your head forward or downward to look at the screen. Resting When lying down and resting, avoid positions that are most painful for you. Try to support your neck in a neutral position. You can use a contour pillow or a small rolled-up towel. Your pillow should support your neck but not push on it. This information is not intended to replace advice given to you by your health care provider. Make sure you discuss any questions you have with your health care provider. Document Released: 01/05/2005 Document Revised: 09/12/2015 Document Reviewed: 12/12/2014 Elsevier Interactive Patient Education  2018 [employer]. [employer] We placed an order today for  your standing lab work.    Please come back and get your standing labs in march and every 3 months  We have open lab Monday through Friday from 8:30-11:30 AM and 1:30-4 PM at the office of Dr. Pollyann Savoy.   The office is located at [address], [address], [place], [place] [address] No appointment is necessary.   Labs are drawn by [facility].  You may receive a bill from [place] for your lab work. If you have any questions regarding directions or hours of operation,  please  call [phone].

## 2017-02-02 NOTE — Progress Notes (Signed)
Office Visit Note  [name]             Date of Birth: [birth year]           MRN: [id number]             PCP: Excell Seltzer, MD Referring: Excell Seltzer, MD Visit Date: 02/02/2017 Occupation: @GUAROCC @    Subjective:  Other (increased pain/ flare )   History of Present Illness: Patricia Reed is a 57 y.o. female with history of autoimmune disease and fibromyalgia syndrome. She states she's been having increased pain and discomfort in her bilateral hands. She also continues to have pain around her neck and her trochanteric area. She states she is burning sensation in all over her skin. She denies any joint swelling. She ran out of Plaquenil about 2 months ago as she could not get her eye exam and will be starting soon after getting her eye exam.  Activities of Daily Living:  Patient reports morning stiffness for 20 minutes.   Patient Reports nocturnal pain.  Difficulty dressing/grooming: Denies Difficulty climbing stairs: Denies Difficulty getting out of chair: Denies Difficulty using hands for taps, buttons, cutlery, and/or writing: Denies   Review of Systems  Constitutional: Positive for fatigue. Negative for night sweats, weight gain, weight loss and weakness.  HENT: Negative for mouth sores, trouble swallowing, trouble swallowing, mouth dryness and nose dryness.   Eyes: Negative for pain, redness, visual disturbance and dryness.  Respiratory: Negative for cough, shortness of breath and difficulty breathing.   Cardiovascular: Negative for chest pain, palpitations, hypertension, irregular heartbeat and swelling in legs/feet.  Gastrointestinal: Negative for blood in stool and constipation.  Endocrine: Negative for increased urination.  Genitourinary: Negative for vaginal dryness.  Musculoskeletal: Positive for arthralgias, joint pain, myalgias, morning stiffness and myalgias. Negative for joint swelling, muscle weakness and muscle tenderness.  Skin:  Positive for color change and rash. Negative for hair loss, skin tightness, ulcers and sensitivity to sunlight.  Allergic/Immunologic: Negative for susceptible to infections.  Neurological: Negative for dizziness, memory loss and night sweats.  Hematological: Negative for swollen glands.  Psychiatric/Behavioral: Positive for sleep disturbance. Negative for depressed mood. The patient is not nervous/anxious.     PMFS History:  Patient Active Problem List   Diagnosis Date Noted  . Situational anxiety 06/08/2016  . Atypical chest pain 06/08/2016  . Trapezius muscle spasm 04/23/2016  . Fibromyalgia 02/13/2016  . 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 Chronic fatigue 02/13/2016  . Asthma 11/25/2015  . Vitamin D deficiency 11/25/2015  . Autoimmune disease (HCC) 11/23/2015  . High risk medication use 11/23/2015  . Colon cancer screening 01/25/2014  . Anterior neck pain 12/01/2013  . Inflammatory arthritis 08/18/2012  . Allergic rhinitis 06/23/2012  . HTN (hypertension) 06/23/2012  . High cholesterol 06/23/2012  . Thyroid nodule - benign 06/23/2012  . Mild intermittent asthma   . Sickle cell trait (HCC)   . History of stomach ulcers   . ESOPHAGEAL STRICTURE 10/04/2007  . GERD 10/04/2007  . GASTRITIS, CHRONIC 10/04/2007  . Dysphagia, pharyngoesophageal phase 10/04/2007    Past Medical History:  Diagnosis Date  . Anemia   . Asthma   . GERD (gastroesophageal reflux disease)   . History of stomach ulcers   . HTN (hypertension)   . Lupus   . MI (myocardial infarction) (HCC)    2013  . Rheumatoid arthritis (HCC)   . Thyroid mass   . Vertigo     Family History  Problem Relation Age of Onset  . Alcohol abuse Father   . Hypertension Father   . Heart disease Father   . Colon cancer Maternal Uncle   . Diabetes Maternal Grandmother   . Colon polyps Neg Hx   . Esophageal cancer Neg Hx   . Kidney disease Neg Hx   . Stomach cancer Neg Hx   . Rectal cancer Neg Hx    Past Surgical  History:  Procedure Laterality Date  . ABDOMINAL HYSTERECTOMY     done for menorrhagia, ovaries remain  . CERVICAL DISCECTOMY     plates and screws in neck  . CESAREAN SECTION    . COLONOSCOPY    . ESOPHAGEAL MANOMETRY N/A 02/12/2014   Procedure: ESOPHAGEAL MANOMETRY (EM);  Surgeon: Louis Meckel, MD;  Location: [facility];  Service: Endoscopy;  Laterality: N/A;  . ESOPHAGOGASTRODUODENOSCOPY  03/04/2011   Procedure: ESOPHAGOGASTRODUODENOSCOPY (EGD);  Surgeon: Louis Meckel, MD;  Location: [facility];  Service: Endoscopy;  Laterality: N/A;  BOTOX Injection  . exc benign breast lump    . TONSILLECTOMY    . UPPER GASTROINTESTINAL ENDOSCOPY     Social History   Social History Narrative  . Not on file     Objective: Vital Signs: BP 140/76 (BP Location: Left Arm, Patient Position: Sitting, Cuff Size: Normal)   Pulse 82   Resp 17   Ht 5\' 5"  (1.651 m)   Wt 210 lb (95.3 kg)   BMI 34.95 kg/m    Physical Exam  Constitutional: She is oriented to person, place, and time. She appears well-developed and well-nourished.  HENT:  Head: Normocephalic and atraumatic.  Eyes: Conjunctivae and EOM are normal.  Neck: Normal range of motion.  Cardiovascular: Normal rate, regular rhythm, normal heart sounds and intact distal pulses.  Pulmonary/Chest: Effort normal and breath sounds normal.  Abdominal: Soft. Bowel sounds are normal.  Lymphadenopathy:    She has no cervical adenopathy.  Neurological: She is alert and oriented to person, place, and time.  Skin: Skin is warm and dry. Capillary refill takes less than 2 seconds.  Psychiatric: She has a normal mood and affect. Her behavior is normal.  Nursing note and vitals reviewed.    Musculoskeletal Exam: C-spine and thoracic lumbar spine good range of motion. She has bilateral trapezius is spasm and discomfort. Shoulder joints elbow joints wrist joint MCPs PIPs DIPs are good range of motion with no synovitis. Hip joints knee joints ankles  MTPs PIPs DIPs with good range of motion with no synovitis. She has generalized hyperalgesia due to fibromyalgia.  CDAI Exam: No CDAI exam completed.    Investigation: No additional findings. CBC Latest Ref Rng & Units 12/30/2016 12/22/2016 10/22/2016  WBC 4.0 - 10.5 K/uL 4.5 3.5(L) 4.4  Hemoglobin 12.0 - 15.0 g/dL 15.4 00.8 67.6  Hematocrit 36.0 - 46.0 % 43.0 38.2 38.7  Platelets 150 - 400 K/uL 272 266 276   CMP Latest Ref Rng & Units 12/30/2016 12/22/2016 10/22/2016  Glucose 65 - 99 mg/dL 91 81 -  BUN 6 - 20 mg/dL 11 12 -  Creatinine 1.95 - 1.00 mg/dL 0.93 2.67 -  Sodium 124 - 145 mmol/L 139 142 -  Potassium 3.5 - 5.1 mmol/L 3.4(L) 3.8 -  Chloride 101 - 111 mmol/L 104 104 -  CO2 22 - 32 mmol/L 24 29 -  Calcium 8.9 - 10.3 mg/dL 9.5 9.3 -  Total Protein 6.5 - 8.1 g/dL 5.8(K) 7.3 7.4  Total Bilirubin 0.3 - 1.2 mg/dL 0.9  0.7 -  Alkaline Phos 38 - 126 U/L 65 - -  AST 15 - 41 U/L 19 17 -  ALT 14 - 54 U/L 18 13 -    Imaging: No results found.  Speciality Comments: No specialty comments available.    Procedures:  Trigger Point Inj Date/Time: 02/02/2017 10:13 AM Performed by: Pollyann Savoy, MD Authorized by: Pollyann Savoy, MD   Consent Given by:  Patient Site marked: the procedure site was marked   Timeout: prior to procedure the correct patient, procedure, and site was verified   Indications:  Muscle spasm and pain Total # of Trigger Points:  2 Location: neck   Needle Size:  27 G Approach:  Dorsal Medications #1:  0.5 mL lidocaine 1 %; 10 mg triamcinolone acetonide 40 MG/ML Medications #2:  0.5 mL lidocaine (PF) 1 %; 10 mg triamcinolone acetonide 40 MG/ML Patient tolerance:  Patient tolerated the procedure well with no immediate complications   Allergies: Aspirin; Hydrocodone-acetaminophen; Ibuprofen; Imdur [isosorbide nitrate]; Meperidine hcl; Morphine; Oxycodone-acetaminophen; Procaine hcl; Toprol xl [metoprolol]; and Tramadol   Assessment / Plan:     Visit  Diagnoses: Mixed connective tissue disease (HCC) - +ANA,+Sm,+RNP,+RF. She continues to have a lot of pain and discomfort and. I do not see any synovitis on examination. She ran out of Plaquenil. She states she will get eye examines restart Plaquenil. I do not see any synovitis on examination. I will get autoimmune labs with her next labs in March.  Patient states that she's been followed up by cardiologist Dr. Antoine Poche. I will forward my note to him today. She will probably need a baseline echocardiogram if she has not had one due to underlying was connective tissue disease and to rule out pulmonary hypertension. She describes some shortness of breath on exertion.  High risk medication use - PLQ ran out 2 months ago, MTX 0.8 ml sq q wk , folic acid 2 mg po qd eye exam is due. Her labs will be done in March and then every 3 months to monitor for drug toxicity her labs have been stable.  Fibromyalgia: She's been having generalized pain and discomfort from fibromyalgia.  Neck pain: She's been having a lot of neck pain and stiffness and trapezius spasm. Per request bilateral trapezius area were injected as described above.  Trochanteric bursitis of both hips: She is also having a lot of trochanteric pain for which ITB and exercise handout was given.  Primary insomnia: Good sleep hygiene was discussed.  Chronic fatigue: She's been experiencing a lot of fatigue due to insomnia.  History of vitamin D deficiency use of vitamin D on a regular basis was discussed.  Other medical problems are listed as follows:  History of asthma  History of hypertension  History of stomach ulcers  History of high cholesterol   She was a lot of discomfort today and wanted a excuse from her work due to pain and stiffness. I've given her a work excuse note for today.  Orders: Orders Placed This Encounter  Procedures  . Trigger Point Inj  . CBC with Differential/Platelet  . COMPLETE METABOLIC PANEL WITH GFR  .  Urinalysis, Routine w reflex microscopic  . Anti-DNA antibody, double-stranded  . C3 and C4  . Sedimentation rate  . VITAMIN D 25 Hydroxy (Vit-D Deficiency, Fractures)  . ANA   No orders of the defined types were placed in this encounter.   Face-to-face time spent with patient was 30 minutes. Greater than 50% of time was spent  in counseling and coordination of care.  Follow-Up Instructions: Return in about 5 months (around 07/03/2017) for Autoimmune disease, Osteoarthritis,FMS.   Pollyann Savoy, MD  Note - This record has been created using Animal nutritionist.  Chart creation errors have been sought, but may not always  have been located. Such creation errors do not reflect on  the standard of medical care.

## 2017-02-04 ENCOUNTER — Ambulatory Visit: Payer: Self-pay | Admitting: *Deleted

## 2017-02-04 ENCOUNTER — Encounter: Payer: Self-pay | Admitting: Family Medicine

## 2017-02-04 ENCOUNTER — Ambulatory Visit: Payer: BC Managed Care – PPO | Admitting: Family Medicine

## 2017-02-04 DIAGNOSIS — R42 Dizziness and giddiness: Secondary | ICD-10-CM | POA: Diagnosis not present

## 2017-02-04 NOTE — Telephone Encounter (Signed)
Pt   Reports   She  Had  And  Injection  In  Her  Upper back / shoulders   sev  Days  Ago  By a   Specialist      For  Fibromyalgia    Since then  She  Has  Had  dizzyness   Tightness in injection  Area    Fever   100  This  Am  And  Weakness   . She  Is  Alert  And  Oriented   No  Rash  No  resp  Distress  speaking in  Complete sentances  . No  Availability   With  PCP   appt  Made  Today   With  elam     Reason for Disposition . [1] MODERATE dizziness (e.g., interferes with normal activities) AND [2] has NOT been evaluated by physician for this  (Exception: dizziness caused by heat exposure, sudden standing, or poor fluid intake)  Answer Assessment - Initial Assessment Questions 1. DESCRIPTION: "Describe your dizziness."      When   Laying  Flat  Feels   Bed  Is  Spinning    When  Sitting   Feels  Like  It  Is   Rocking    When  Stands  Up  Gets  Lightheaded  And  Nauseated   2. LIGHTHEADED: "Do you feel lightheaded?" (e.g., somewhat faint, woozy, weak upon standing)       Feels   Weak  When stands up    3. VERTIGO: "Do you feel like either you or the room is spinning or tilting?" (i.e. vertigo)     No 4. SEVERITY: "How bad is it?"  "Do you feel like you are going to faint?" "Can you stand and walk?"   - MILD - walking normally   - MODERATE - interferes with normal activities (e.g., work, school)    - SEVERE - unable to stand, requires support to walk, feels like passing out now.      mild 5. ONSET:  "When did the dizziness begin?"      yest   6. AGGRAVATING FACTORS: "Does anything make it worse?" (e.g., standing, change in head position)     Laying  Down   7. HEART RATE: "Can you tell me your heart rate?" "How many beats in 15 seconds?"  (Note: not all patients can do this)       Cannot  Do  8. CAUSE: "What do you think is causing the dizziness?"      Had  Injection  2  Days  Ago   Had  2  Injections  In  Upper back And  Shoulder   Area  For  Fibromyalgia     9. RECURRENT SYMPTOM: "Have  you had dizziness before?" If so, ask: "When was the last time?" "What happened that time?"     Has  Had  In  Past   10. OTHER SYMPTOMS: "Do you have any other symptoms?" (e.g., fever, chest pain, vomiting, diarrhea, bleeding)      Pt  Has  Lupus   And  Fibromyalgia    Had  Temp  Of  100  This  Am   Pt reports   Area  Of  Tightness  And  Feels  Hard    And  At  Injection  Site   11. PREGNANCY: "Is there any chance you are pregnant?" "When was your last menstrual period?"  N/a  Protocols used: DIZZINESS [employer]re[facility] [employer]

## 2017-02-04 NOTE — Assessment & Plan Note (Signed)
Unclear if she is having a allergy to the trigger point injections. She looks good today on exam. No suggestion of allergic reactions on the scan. Possible she has a viral illness - Counseled on supportive care - Given indications to follow-up - Provided a work note

## 2017-02-04 NOTE — Telephone Encounter (Signed)
Pt has appt with Dr Jordan Likes today at 3:20.

## 2017-02-04 NOTE — Progress Notes (Signed)
Patricia Reed - 57 y.o. female MRN [id number]  Date of birth: 07/28/1960  SUBJECTIVE:  Including CC & ROS.  Chief Complaint  Patient presents with  . Dizziness    Patricia Reed is a 57 y.o. female that is presenting with dizziness and nausea. She received trigger point injections in each trapezius from Dr. Corliss Skains for her neck pain on 02/02/17. She felt itchy all over her back. She took some benadryl with some improvement. Denies body aches or fevers. She has malaise and nausea. Sure he had a recorded temperature of 100 this morning. She denies any vomiting or diarrhea. She is a Chartered loss adjuster. She feels that she has dizziness when she is rolling over in bed. This feels like the room is spinning. She has had dizziness like this before. She has not had any changes in her medications.    Review of Systems  Constitutional: Negative for fever.  Respiratory: Negative for shortness of breath.   Cardiovascular: Negative for chest pain.  Gastrointestinal: Negative for abdominal pain.  Musculoskeletal: Positive for arthralgias, back pain, myalgias and neck pain. Negative for gait problem.  Skin: Negative for color change and rash.  Hematological: Negative for adenopathy.  Psychiatric/Behavioral: Negative for agitation.    HISTORY: Past Medical, Surgical, Social, and Family History Reviewed & Updated per EMR.   Pertinent Historical Findings include:  Past Medical History:  Diagnosis Date  . Anemia   . Asthma   . GERD (gastroesophageal reflux disease)   . History of stomach ulcers   . HTN (hypertension)   . Lupus   . MI (myocardial infarction) (HCC)    2013  . Rheumatoid arthritis (HCC)   . Thyroid mass   . Vertigo     Past Surgical History:  Procedure Laterality Date  . ABDOMINAL HYSTERECTOMY     done for menorrhagia, ovaries remain  . CERVICAL DISCECTOMY     plates and screws in neck  . CESAREAN SECTION    . COLONOSCOPY    . ESOPHAGEAL MANOMETRY N/A 02/12/2014   Procedure: ESOPHAGEAL MANOMETRY (EM);  Surgeon: Louis Meckel, MD;  Location: [facility];  Service: Endoscopy;  Laterality: N/A;  . ESOPHAGOGASTRODUODENOSCOPY  03/04/2011   Procedure: ESOPHAGOGASTRODUODENOSCOPY (EGD);  Surgeon: Louis Meckel, MD;  Location: [facility];  Service: Endoscopy;  Laterality: N/A;  BOTOX Injection  . exc benign breast lump    . TONSILLECTOMY    . UPPER GASTROINTESTINAL ENDOSCOPY      Allergies  Allergen Reactions  . Aspirin Other (See Comments)    GI bleed  . Hydrocodone-Acetaminophen Hives and Nausea And Vomiting  . Ibuprofen Other (See Comments)    GI bleed  . Imdur [Isosorbide Nitrate] Other (See Comments)    Reaction unknown  . Meperidine Hcl Hives and Nausea And Vomiting    Short term memory loss  . Morphine Hives and Nausea And Vomiting  . Oxycodone-Acetaminophen Hives and Nausea And Vomiting    Reaction unknown  . Procaine Hcl Other (See Comments)    Ineffective  . Toprol Xl [Metoprolol] Other (See Comments)    Reaction unknown  . Tramadol Itching    Family History  Problem Relation Age of Onset  . Alcohol abuse Father   . Hypertension Father   . Heart disease Father   . Colon cancer Maternal Uncle   . Diabetes Maternal Grandmother   . Colon polyps Neg Hx   . Esophageal cancer Neg Hx   . Kidney disease Neg Hx   . Stomach cancer Neg  Hx   . Rectal cancer Neg Hx      Social History   Socioeconomic History  . Marital status: Married    Spouse name: Not on file  . Number of children: 4  . Years of education: Not on file  . Highest education level: Not on file  Social Needs  . Financial resource strain: Not on file  . Food insecurity - worry: Not on file  . Food insecurity - inability: Not on file  . Transportation needs - medical: Not on file  . Transportation needs - non-medical: Not on file  Occupational History  . Occupation: Lobbyist: [employer]  Tobacco Use  . Smoking status: Never  Smoker  . Smokeless tobacco: Never Used  Substance and Sexual Activity  . Alcohol use: No    Alcohol/week: 0.0 oz  . Drug use: No  . Sexual activity: Yes    Birth control/protection: None, Surgical    Comment: LAVH  Other Topics Concern  . Not on file  Social History Narrative  . Not on file     PHYSICAL EXAM:  VS: BP 122/78 (BP Location: Left Arm, Patient Position: Sitting, Cuff Size: Normal)   Pulse 68   Temp 98.2 F (36.8 C) (Oral)   Ht 5\' 5"  (1.651 m)   Wt 209 lb (94.8 kg)   SpO2 98%   BMI 34.78 kg/m  Physical Exam Gen: NAD, alert, cooperative with exam, well-appearing ENT: normal lips, normal nasal mucosa,  Eye: normal EOM, normal conjunctiva and lids CV:  no edema, +2 pedal pulses, S1-S2, regular rate and rhythm   Resp: no accessory muscle use, non-labored, clear to auscultation bilaterally, no crackles or wheezes, Skin: no rashes, no areas of induration  Neuro: normal tone, normal sensation to touch Psych:  normal insight, alert and oriented MSK:  Normal neck range of motion. She has tenderness to palpation over the right and left trapezius. No abnormal induration or skin reaction. No tenderness to palpation of the cervical or thoracic midline spine. Normal shrug strength resistance. Normal shoulder range of motion. Normal grip strength. Neurovascular intact     ASSESSMENT & PLAN:   Dizziness Unclear if she is having a allergy to the trigger point injections. She looks good today on exam. No suggestion of allergic reactions on the scan. Possible she has a viral illness - Counseled on supportive care - Given indications to follow-up - Provided a work note

## 2017-02-04 NOTE — Patient Instructions (Signed)
You may be having a cold or an allergic reaction to the medicine I think you're symptoms will slowly start to improve.  Staying well hydrate and rest will be key.   How to Perform the Epley Maneuver The Epley maneuver is an exercise that relieves symptoms of vertigo. Vertigo is the feeling that you or your surroundings are moving when they are not. When you feel vertigo, you may feel like the room is spinning and have trouble walking. Dizziness is a little different than vertigo. When you are dizzy, you may feel unsteady or light-headed. You can do this maneuver at home whenever you have symptoms of vertigo. You can do it up to 3 times a day until your symptoms go away. Even though the Epley maneuver may relieve your vertigo for a few weeks, it is possible that your symptoms will return. This maneuver relieves vertigo, but it does not relieve dizziness. What are the risks? If it is done correctly, the Epley maneuver is considered safe. Sometimes it can lead to dizziness or nausea that goes away after a short time. If you develop other symptoms, such as changes in vision, weakness, or numbness, stop doing the maneuver and call your health care provider. How to perform the Epley maneuver 1. Sit on the edge of a bed or table with your back straight and your legs extended or hanging over the edge of the bed or table. 2. Turn your head halfway toward the affected ear or side. 3. Lie backward quickly with your head turned until you are lying flat on your back. You may want to position a pillow under your shoulders. 4. Hold this position for 30 seconds. You may experience an attack of vertigo. This is normal. 5. Turn your head to the opposite direction until your unaffected ear is facing the floor. 6. Hold this position for 30 seconds. You may experience an attack of vertigo. This is normal. Hold this position until the vertigo stops. 7. Turn your whole body to the same side as your head. Hold for another  30 seconds. 8. Sit back up. You can repeat this exercise up to 3 times a day. Follow these instructions at home:  After doing the Epley maneuver, you can return to your normal activities.  Ask your health care provider if there is anything you should do at home to prevent vertigo. He or she may recommend that you: ? Keep your head raised (elevated) with two or more pillows while you sleep. ? Do not sleep on the side of your affected ear. ? Get up slowly from bed. ? Avoid sudden movements during the day. ? Avoid extreme head movement, like looking up or bending over. Contact a health care provider if:  Your vertigo gets worse.  You have other symptoms, including: ? Nausea. ? Vomiting. ? Headache. Get help right away if:  You have vision changes.  You have a severe or worsening headache or neck pain.  You cannot stop vomiting.  You have new numbness or weakness in any part of your body. Summary  Vertigo is the feeling that you or your surroundings are moving when they are not.  The Epley maneuver is an exercise that relieves symptoms of vertigo.  If the Epley maneuver is done correctly, it is considered safe. You can do it up to 3 times a day. This information is not intended to replace advice given to you by your health care provider. Make sure you discuss any questions you have  with your health care provider. Document Released: 01/10/2013 Document Revised: 11/26/2015 Document Reviewed: 11/26/2015 Elsevier Interactive Patient Education  2017 [employer].

## 2017-02-08 ENCOUNTER — Telehealth: Payer: Self-pay | Admitting: *Deleted

## 2017-02-08 NOTE — Telephone Encounter (Signed)
-----   Message from Rollene Rotunda, MD sent at 02/02/2017  5:35 PM EST ----- This patient needs a new patient appt.  I have not seen her in 5 years.  Her rheumatologist wants her to be seen.  ----- Message ----- From: Pollyann Savoy, MD Sent: 02/02/2017  10:26 AM To: Rollene Rotunda, MD, Excell Seltzer, MD

## 2017-02-08 NOTE — Telephone Encounter (Signed)
Leave several message for pt to give office a call to schedule office visit.

## 2017-02-22 ENCOUNTER — Encounter: Payer: Self-pay | Admitting: Adult Health

## 2017-02-22 ENCOUNTER — Ambulatory Visit: Payer: BC Managed Care – PPO | Admitting: Adult Health

## 2017-02-22 ENCOUNTER — Telehealth: Payer: Self-pay | Admitting: Rheumatology

## 2017-02-22 VITALS — BP 138/90 | Temp 98.0°F | Wt 203.0 lb

## 2017-02-22 DIAGNOSIS — J069 Acute upper respiratory infection, unspecified: Secondary | ICD-10-CM

## 2017-02-22 DIAGNOSIS — K2970 Gastritis, unspecified, without bleeding: Secondary | ICD-10-CM

## 2017-02-22 DIAGNOSIS — R0602 Shortness of breath: Secondary | ICD-10-CM

## 2017-02-22 MED ORDER — ONDANSETRON HCL 4 MG PO TABS
4.0000 mg | ORAL_TABLET | Freq: Three times a day (TID) | ORAL | 0 refills | Status: DC | PRN
Start: 2017-02-22 — End: 2017-02-26

## 2017-02-22 MED ORDER — FLUTICASONE PROPIONATE 50 MCG/ACT NA SUSP: 2.0000 | Freq: Every day | NASAL | 6 refills | Status: DC

## 2017-02-22 MED ORDER — ONDANSETRON HCL 4 MG/2ML IJ SOLN
4.0000 mg | Freq: Three times a day (TID) | INTRAMUSCULAR | Status: DC | PRN
  Administered 2017-02-22: 4 mg via INTRAMUSCULAR

## 2017-02-22 NOTE — Progress Notes (Signed)
Subjective:    Patient ID: Patricia Reed, female    DOB: Nov 19, 1960, 57 y.o.   MRN: [id number]  HPI  57 year old female who  has a past medical history of Anemia, Asthma, GERD (gastroesophageal reflux disease), History of stomach ulcers, HTN (hypertension), Lupus, MI (myocardial infarction) (HCC), Rheumatoid arthritis (HCC), Thyroid mass, and Vertigo.  She presents to the office today for the acute complaint of fevers, chills, nausea, vomiting, dizziness, nasal congestion, and headaches. Her symptoms have been present for 3 days.   She denies any diarrhea or constipation.   Review of Systems  Constitutional: Positive for activity change, appetite change, chills, diaphoresis, fatigue and fever.  Eyes: Negative.   Respiratory: Negative.   Cardiovascular: Negative.   Gastrointestinal: Positive for abdominal pain (cramping), nausea and vomiting. Negative for abdominal distention, blood in stool, constipation and diarrhea.  Genitourinary: Negative.   Musculoskeletal: Negative.     Past Medical History:  Diagnosis Date  . Anemia   . Asthma   . GERD (gastroesophageal reflux disease)   . History of stomach ulcers   . HTN (hypertension)   . Lupus   . MI (myocardial infarction) (HCC)    2013  . Rheumatoid arthritis (HCC)   . Thyroid mass   . Vertigo     Social History   Socioeconomic History  . Marital status: Married    Spouse name: Not on file  . Number of children: 4  . Years of education: Not on file  . Highest education level: Not on file  Social Needs  . Financial resource strain: Not on file  . Food insecurity - worry: Not on file  . Food insecurity - inability: Not on file  . Transportation needs - medical: Not on file  . Transportation needs - non-medical: Not on file  Occupational History  . Occupation: Lobbyist: [employer]  Tobacco Use  . Smoking status: Never Smoker  . Smokeless tobacco: Never Used  Substance and  Sexual Activity  . Alcohol use: No    Alcohol/week: 0.0 oz  . Drug use: No  . Sexual activity: Yes    Birth control/protection: None, Surgical    Comment: LAVH  Other Topics Concern  . Not on file  Social History Narrative  . Not on file    Past Surgical History:  Procedure Laterality Date  . ABDOMINAL HYSTERECTOMY     done for menorrhagia, ovaries remain  . CERVICAL DISCECTOMY     plates and screws in neck  . CESAREAN SECTION    . COLONOSCOPY    . ESOPHAGEAL MANOMETRY N/A 02/12/2014   Procedure: ESOPHAGEAL MANOMETRY (EM);  Surgeon: Louis Meckel, MD;  Location: [facility];  Service: Endoscopy;  Laterality: N/A;  . ESOPHAGOGASTRODUODENOSCOPY  03/04/2011   Procedure: ESOPHAGOGASTRODUODENOSCOPY (EGD);  Surgeon: Louis Meckel, MD;  Location: [facility];  Service: Endoscopy;  Laterality: N/A;  BOTOX Injection  . exc benign breast lump    . TONSILLECTOMY    . UPPER GASTROINTESTINAL ENDOSCOPY      Family History  Problem Relation Age of Onset  . Alcohol abuse Father   . Hypertension Father   . Heart disease Father   . Colon cancer Maternal Uncle   . Diabetes Maternal Grandmother   . Colon polyps Neg Hx   . Esophageal cancer Neg Hx   . Kidney disease Neg Hx   . Stomach cancer Neg Hx   . Rectal cancer Neg Hx  Allergies  Allergen Reactions  . Aspirin Other (See Comments)    GI bleed  . Hydrocodone-Acetaminophen Hives and Nausea And Vomiting  . Ibuprofen Other (See Comments)    GI bleed  . Imdur [Isosorbide Nitrate] Other (See Comments)    Reaction unknown  . Meperidine Hcl Hives and Nausea And Vomiting    Short term memory loss  . Morphine Hives and Nausea And Vomiting  . Oxycodone-Acetaminophen Hives and Nausea And Vomiting    Reaction unknown  . Procaine Hcl Other (See Comments)    Ineffective  . Toprol Xl [Metoprolol] Other (See Comments)    Reaction unknown  . Tramadol Itching    Current Outpatient Medications on File Prior to Visit  Medication  Sig Dispense Refill  . acetaminophen (TYLENOL) 650 MG CR tablet Take 650 mg by mouth every 8 (eight) hours as needed for pain.    Marland Kitchen albuterol (PROVENTIL HFA;VENTOLIN HFA) 108 (90 BASE) MCG/ACT inhaler Inhale 2 puffs into the lungs every 6 (six) hours as needed for wheezing or shortness of breath.    . diclofenac sodium (VOLTAREN) 1 % GEL Apply 3 g topically as needed.    . folic acid (FOLVITE) 1 MG tablet Take 1 tablet (1 mg total) by mouth 2 (two) times daily. 180 tablet 4  . hydroxychloroquine (PLAQUENIL) 200 MG tablet Take 200 mg by mouth 2 (two) times daily.    Marland Kitchen levalbuterol (XOPENEX HFA) 45 MCG/ACT inhaler Inhale 1-2 puffs into the lungs every 4 (four) hours as needed for wheezing or shortness of breath.     . methotrexate 50 MG/2ML injection Inject 0.8 mLs (20 mg total) into the skin once a week. 10 mL 0  . ondansetron (ZOFRAN ODT) 8 MG disintegrating tablet 8mg  ODT q8 hours prn nausea 4 tablet 0  . Tuberculin-Allergy Syringes (ALLERGY SYRINGE 1CC/27GX1/2") 27G X 1/2" 1 ML MISC Patient to use to inject MTX weekly 12 each 3   No current facility-administered medications on file prior to visit.     BP 138/90 (BP Location: Left Arm)   Temp 98 F (36.7 C) (Oral)   Wt 203 lb (92.1 kg)   BMI 33.78 kg/m       Objective:   Physical Exam  Constitutional: She is oriented to person, place, and time. She appears well-developed and well-nourished. No distress.  HENT:  Head: Normocephalic and atraumatic.  Right Ear: Hearing, tympanic membrane, external ear and ear canal normal.  Left Ear: Hearing, tympanic membrane, external ear and ear canal normal.  Nose: Mucosal edema and rhinorrhea present. Right sinus exhibits maxillary sinus tenderness.  Mouth/Throat: Uvula is midline, oropharynx is clear and moist and mucous membranes are normal. No oropharyngeal exudate.  Eyes: Conjunctivae and EOM are normal. Pupils are equal, round, and reactive to light. Right eye exhibits no discharge. Left eye  exhibits no discharge. No scleral icterus.  Cardiovascular: Normal rate, regular rhythm, normal heart sounds and intact distal pulses. Exam reveals no gallop and no friction rub.  No murmur heard. Pulmonary/Chest: Effort normal and breath sounds normal. No respiratory distress. She has no wheezes. She has no rales.  Abdominal: Soft. Bowel sounds are normal. She exhibits no distension, no abdominal bruit, no ascites and no mass. There is no hepatosplenomegaly, splenomegaly or hepatomegaly. There is generalized tenderness. There is no rebound, no guarding and no CVA tenderness.  Neurological: She is alert and oriented to person, place, and time.  Skin: Skin is warm and dry. No rash noted. She is not diaphoretic.  No erythema. No pallor.  Psychiatric: She has a normal mood and affect. Her behavior is normal. Judgment and thought content normal.  Nursing note and vitals reviewed.     Assessment & Plan:  1. Viral gastritis - Advised rest and clear liquid diet until symptoms improve - ondansetron (ZOFRAN) 4 MG tablet; Take 1 tablet (4 mg total) by mouth every 8 (eight) hours as needed for nausea or vomiting.  Dispense: 20 tablet; Refill: 0 - ondansetron (ZOFRAN) injection 4 mg - Follow up in 2-3 days if no improvement or sooner if needed - go to the ER if vomiting is not controlled with zofran or abdominal pain does not subside.  - Work note given   2. Upper respiratory tract infection, unspecified type - To early to diagnose as bacterial. Will have her use flonase  - fluticasone (FLONASE) 50 MCG/ACT nasal spray; Place 2 sprays into both nostrils daily.  Dispense: 16 g; Refill: 6 - Follow up if no improvement in 2-3 days or sooner if needed  Shirline Frees, NP

## 2017-02-22 NOTE — Telephone Encounter (Signed)
Patient called stating that Dr. Corliss Skains wanted her to see a heart specialist and patient wants to see Dr. Rollene Rotunda.  She called their office and they need a referral from Dr. Corliss Skains before she will be able to schedule an appointment.

## 2017-02-22 NOTE — Telephone Encounter (Signed)
Left message to advise patient referral has been placed.  

## 2017-02-23 ENCOUNTER — Telehealth: Payer: Self-pay | Admitting: Family Medicine

## 2017-02-23 NOTE — Telephone Encounter (Signed)
Fine with me

## 2017-02-23 NOTE — Telephone Encounter (Signed)
No red flags, okay to transfer if agreeable with Dr. [place]

## 2017-02-23 NOTE — Telephone Encounter (Signed)
See below message Is it ok to schedule?  Copied from CRM [id number]. Topic: Inquiry >> Feb 22, 2017  8:42 AM Cipriano Bunker wrote: Reason for CRM: Patient has moved to [facility] [place] the [place] and would like to transfer to Betty [place] at the [place] office.

## 2017-02-24 ENCOUNTER — Telehealth: Payer: Self-pay | Admitting: Adult Health

## 2017-02-24 NOTE — Telephone Encounter (Signed)
Left a message for a return call.

## 2017-02-24 NOTE — Telephone Encounter (Signed)
Patient  Dropped off FMLA forms   Call patient to pick up forms at: [phone]  Disposition: Dr's Folder

## 2017-02-24 NOTE — Telephone Encounter (Signed)
Ok to send in Doxycycline 100 mg BID x 7 days

## 2017-02-24 NOTE — Progress Notes (Deleted)
Office Visit Note  [name]             Date of Birth: [birth year]           MRN: [id number]             PCP: Excell Seltzer, MD Referring: Excell Seltzer, MD Visit Date: 03/10/2017 Occupation: @GUAROCC @    Subjective:  No chief complaint on file.   History of Present Illness: Patricia FEVEN Reed is a 57 y.o. female ***   Activities of Daily Living:  Patient reports morning stiffness for *** {minute/hour:19697}.   Patient {ACTIONS;DENIES/REPORTS:21021675::"Denies"} nocturnal pain.  Difficulty dressing/grooming: {ACTIONS;DENIES/REPORTS:21021675::"Denies"} Difficulty climbing stairs: {ACTIONS;DENIES/REPORTS:21021675::"Denies"} Difficulty getting out of chair: {ACTIONS;DENIES/REPORTS:21021675::"Denies"} Difficulty using hands for taps, buttons, cutlery, and/or writing: {ACTIONS;DENIES/REPORTS:21021675::"Denies"}   No Rheumatology ROS completed.   PMFS History:  Patient Active Problem List   Diagnosis Date Noted  . Dizziness 02/04/2017  . Situational anxiety 06/08/2016  . Atypical chest pain 06/08/2016  . Trapezius muscle spasm 04/23/2016  . Fibromyalgia 02/13/2016  . 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 Chronic fatigue 02/13/2016  . Asthma 11/25/2015  . Vitamin D deficiency 11/25/2015  . Autoimmune disease (HCC) 11/23/2015  . High risk medication use 11/23/2015  . Colon cancer screening 01/25/2014  . Anterior neck pain 12/01/2013  . Inflammatory arthritis 08/18/2012  . Allergic rhinitis 06/23/2012  . HTN (hypertension) 06/23/2012  . High cholesterol 06/23/2012  . Thyroid nodule - benign 06/23/2012  . Mild intermittent asthma   . Sickle cell trait (HCC)   . History of stomach ulcers   . ESOPHAGEAL STRICTURE 10/04/2007  . GERD 10/04/2007  . GASTRITIS, CHRONIC 10/04/2007  . Dysphagia, pharyngoesophageal phase 10/04/2007    Past Medical History:  Diagnosis Date  . Anemia   . Asthma   . GERD (gastroesophageal reflux disease)   . History of stomach  ulcers   . HTN (hypertension)   . Lupus   . MI (myocardial infarction) (HCC)    2013  . Rheumatoid arthritis (HCC)   . Thyroid mass   . Vertigo     Family History  Problem Relation Age of Onset  . Alcohol abuse Father   . Hypertension Father   . Heart disease Father   . Colon cancer Maternal Uncle   . Diabetes Maternal Grandmother   . Colon polyps Neg Hx   . Esophageal cancer Neg Hx   . Kidney disease Neg Hx   . Stomach cancer Neg Hx   . Rectal cancer Neg Hx    Past Surgical History:  Procedure Laterality Date  . ABDOMINAL HYSTERECTOMY     done for menorrhagia, ovaries remain  . CERVICAL DISCECTOMY     plates and screws in neck  . CESAREAN SECTION    . COLONOSCOPY    . ESOPHAGEAL MANOMETRY N/A 02/12/2014   Procedure: ESOPHAGEAL MANOMETRY (EM);  Surgeon: 02/14/2014, MD;  Location: [facility];  Service: Endoscopy;  Laterality: N/A;  . ESOPHAGOGASTRODUODENOSCOPY  03/04/2011   Procedure: ESOPHAGOGASTRODUODENOSCOPY (EGD);  Surgeon: 03/06/2011, MD;  [facility];  Service: Endoscopy;  Laterality: N/A;  BOTOX Injection  . exc benign breast lump    . TONSILLECTOMY    . UPPER GASTROINTESTINAL ENDOSCOPY     Social History   Social History Narrative  . Not on file     Objective: Vital Signs: There were no vitals taken for this visit.   Physical Exam   Musculoskeletal Exam: ***  CDAI Exam: No CDAI exam completed.  Investigation: No additional findings. CBC Latest Ref Rng & Units 12/30/2016 12/22/2016 10/22/2016  WBC 4.0 - 10.5 K/uL 4.5 3.5(L) 4.4  Hemoglobin 12.0 - 15.0 g/dL 47.0 96.2 83.6  Hematocrit 36.0 - 46.0 % 43.0 38.2 38.7  Platelets 150 - 400 K/uL 272 266 276   CMP Latest Ref Rng & Units 12/30/2016 12/22/2016 10/22/2016  Glucose 65 - 99 mg/dL 91 81 -  BUN 6 - 20 mg/dL 11 12 -  Creatinine 6.29 - 1.00 mg/dL 4.76 5.46 -  Sodium 503 - 145 mmol/L 139 142 -  Potassium 3.5 - 5.1 mmol/L 3.4(L) 3.8 -  Chloride 101 - 111 mmol/L 104 104 -  CO2  22 - 32 mmol/L 24 29 -  Calcium 8.9 - 10.3 mg/dL 9.5 9.3 -  Total Protein 6.5 - 8.1 g/dL 5.4(S) 7.3 7.4  Total Bilirubin 0.3 - 1.2 mg/dL 0.9 0.7 -  Alkaline Phos 38 - 126 U/L 65 - -  AST 15 - 41 U/L 19 17 -  ALT 14 - 54 U/L 18 13 -    Imaging: No results found.  Speciality Comments: No specialty comments available.    Procedures:  No procedures performed Allergies: Aspirin; Hydrocodone-acetaminophen; Ibuprofen; Imdur [isosorbide nitrate]; Meperidine hcl; Morphine; Oxycodone-acetaminophen; Procaine hcl; Toprol xl [metoprolol]; and Tramadol   Assessment / Plan:     Visit Diagnoses: No diagnosis found.    Orders: No orders of the defined types were placed in this encounter.  No orders of the defined types were placed in this encounter.   Face-to-face time spent with patient was *** minutes. 50% of time was spent in counseling and coordination of care.  Follow-Up Instructions: No Follow-up on file.   Ellen Henri, CMA  Note - This record has been created using Animal nutritionist.  Chart creation errors have been sought, but may not always  have been located. Such creation errors do not reflect on  the standard of medical care.

## 2017-02-24 NOTE — Telephone Encounter (Signed)
[name], schedule for 02/25/17 is full.  You advised to come back for ov if not getting better.  Please advise.

## 2017-02-24 NOTE — Telephone Encounter (Signed)
Spoke to the pt.  Patricia Reed informed me that Patricia Reed is much worse.  Patricia Reed is going to see her Dr. Hurley Cisco tomorrow morning. I advised her to call me and let me know when Patricia Reed is to return to work.  I did receive FMLA paper work.  Will check with Kandee Keen to see if he is going to fill out or should Dr. Hurley Cisco.

## 2017-02-24 NOTE — Telephone Encounter (Signed)
Patient came in to drop off FMLA forms for [facility] to fill out since she's not feeling and better. She would also like to be called to see if there is any thing else that she can take since the medication that she was prescribed is not working.   Please advise

## 2017-02-25 ENCOUNTER — Other Ambulatory Visit: Payer: Self-pay

## 2017-02-25 ENCOUNTER — Ambulatory Visit: Payer: BC Managed Care – PPO | Admitting: Family Medicine

## 2017-02-25 ENCOUNTER — Encounter: Payer: Self-pay | Admitting: Family Medicine

## 2017-02-25 VITALS — BP 138/90 | HR 76 | Temp 98.8°F | Ht 65.0 in | Wt 206.5 lb

## 2017-02-25 DIAGNOSIS — B349 Viral infection, unspecified: Secondary | ICD-10-CM | POA: Insufficient documentation

## 2017-02-25 DIAGNOSIS — M797 Fibromyalgia: Secondary | ICD-10-CM

## 2017-02-25 DIAGNOSIS — M2550 Pain in unspecified joint: Secondary | ICD-10-CM | POA: Diagnosis not present

## 2017-02-25 LAB — CBC WITH DIFFERENTIAL/PLATELET
BASOS ABS: 0 10*3/uL (ref 0.0–0.1)
Basophils Relative: 0.7 % (ref 0.0–3.0)
EOS ABS: 0 10*3/uL (ref 0.0–0.7)
Eosinophils Relative: 0.7 % (ref 0.0–5.0)
HEMATOCRIT: 40.1 % (ref 36.0–46.0)
HEMOGLOBIN: 13.1 g/dL (ref 12.0–15.0)
Lymphocytes Relative: 22 % (ref 12.0–46.0)
Lymphs Abs: 0.9 10*3/uL (ref 0.7–4.0)
MCHC: 32.7 g/dL (ref 30.0–36.0)
MCV: 87.4 fl (ref 78.0–100.0)
MONO ABS: 0.2 10*3/uL (ref 0.1–1.0)
Monocytes Relative: 6.3 % (ref 3.0–12.0)
Neutro Abs: 2.8 10*3/uL (ref 1.4–7.7)
Neutrophils Relative %: 70.3 % (ref 43.0–77.0)
Platelets: 266 10*3/uL (ref 150.0–400.0)
RBC: 4.59 Mil/uL (ref 3.87–5.11)
RDW: 15.2 % (ref 11.5–15.5)
WBC: 4 10*3/uL (ref 4.0–10.5)

## 2017-02-25 MED ORDER — KETOROLAC TROMETHAMINE 60 MG/2ML IM SOLN
60.0000 mg | Freq: Once | INTRAMUSCULAR | Status: AC
  Administered 2017-02-25: 60 mg via INTRAMUSCULAR

## 2017-02-25 NOTE — Patient Instructions (Signed)
Can use diclofenac gel for pain in small joints. Given toradol injection in office today. Rest, fluids.  Please stop at the lab to have labs drawn.

## 2017-02-25 NOTE — Assessment & Plan Note (Addendum)
Symptoms seem to be improving but it may have flared up her lupus or fibromyalgia or triggered reactive arthritis.  No sign of myalgia.   treat with toradol injection, fluids, rest.    Check cbc as well to make sure no suggestion of bacterial infection.

## 2017-02-25 NOTE — Progress Notes (Signed)
Subjective:    Patient ID: Patricia Reed, female    DOB: 08/13/1960, 57 y.o.   MRN: [id number]  HPI   57 year old female with history of lupus, fibromyalgia presents with  Worsening body aches, fatigue and chills.   She was seen on 2/4 by Shirline Frees at [facility].  At that time she had been having fever, N/V, congestion, headache, abd cramping x 3 days  Dx with viral gastroenteritis and given phenergan   She reports initially started with headache. Followed by emesis x 3-4 days. No diarrhea, mild constipation. Fever 101 F.Abdominal cramping. Now no further emesis., no fever. keeping down fluids.  Now with severe body aches and joint pain. Not muscle pain.  She does have some sore throat in last 1-2 days. Right ear sore.  No cough, no SOB.  She is immunocompromised on methotrexate/plaquenil.  Last shot 5 days ago.  Vitals:   02/25/17 1117  BP: 138/90  Pulse: 76  Temp: 98.8 F (37.1 C)  SpO2: 99%   Social History /Family History/Past Medical History reviewed in detail and updated in EMR if needed.   Review of Systems  Constitutional: Positive for fatigue. Negative for fever.  HENT: Negative for congestion.   Eyes: Negative for pain.  Respiratory: Negative for cough and shortness of breath.   Cardiovascular: Negative for chest pain, palpitations and leg swelling.  Gastrointestinal: Negative for abdominal pain.  Genitourinary: Negative for dysuria and vaginal bleeding.  Musculoskeletal: Negative for back pain.  Neurological: Negative for syncope, light-headedness and headaches.  Psychiatric/Behavioral: Negative for dysphoric mood.       Objective:   Physical Exam  Constitutional: Vital signs are normal. She appears well-developed and well-nourished. She is cooperative.  Non-toxic appearance. She does not appear ill. No distress.  HENT:  Head: Normocephalic.  Right Ear: Hearing, tympanic membrane, external ear and ear canal normal. Tympanic membrane is not  erythematous, not retracted and not bulging.  Left Ear: Hearing, tympanic membrane, external ear and ear canal normal. Tympanic membrane is not erythematous, not retracted and not bulging.  Nose: No mucosal edema or rhinorrhea. Right sinus exhibits no maxillary sinus tenderness and no frontal sinus tenderness. Left sinus exhibits no maxillary sinus tenderness and no frontal sinus tenderness.  Mouth/Throat: Uvula is midline, oropharynx is clear and moist and mucous membranes are normal.  Eyes: Conjunctivae, EOM and lids are normal. Pupils are equal, round, and reactive to light. Lids are everted and swept, no foreign bodies found.  Neck: Trachea normal and normal range of motion. Neck supple. Carotid bruit is not present. No thyroid mass and no thyromegaly present.  Cardiovascular: Normal rate, regular rhythm, S1 normal, S2 normal, normal heart sounds, intact distal pulses and normal pulses. Exam reveals no gallop and no friction rub.  No murmur heard. Pulmonary/Chest: Effort normal and breath sounds normal. No tachypnea. No respiratory distress. She has no decreased breath sounds. She has no wheezes. She has no rhonchi. She has no rales.  Abdominal: Soft. Normal appearance and bowel sounds are normal. There is no tenderness.  Musculoskeletal:   ttp and decreased ROM, no swelling or redness in shoulder, wrists hands ankles, knees, hips  Neurological: She is alert.  Skin: Skin is warm, dry and intact. No rash noted.  Psychiatric: Her speech is normal and behavior is normal. Judgment and thought content normal. Her mood appears not anxious. Cognition and memory are normal. She does not exhibit a depressed mood.   No ttp in muscles.  Assessment & Plan:

## 2017-02-25 NOTE — Telephone Encounter (Signed)
Hi Patricia Reed Can you help get this pt scheduled? Thanks Lucrezia Starch [facility]

## 2017-02-26 ENCOUNTER — Ambulatory Visit: Payer: BC Managed Care – PPO | Admitting: Family Medicine

## 2017-02-26 ENCOUNTER — Encounter: Payer: Self-pay | Admitting: Family Medicine

## 2017-02-26 VITALS — BP 122/78 | HR 84 | Temp 97.8°F | Resp 12 | Ht 65.0 in | Wt 208.2 lb

## 2017-02-26 DIAGNOSIS — M797 Fibromyalgia: Secondary | ICD-10-CM | POA: Diagnosis not present

## 2017-02-26 DIAGNOSIS — J309 Allergic rhinitis, unspecified: Secondary | ICD-10-CM | POA: Diagnosis not present

## 2017-02-26 DIAGNOSIS — E049 Nontoxic goiter, unspecified: Secondary | ICD-10-CM

## 2017-02-26 DIAGNOSIS — J45909 Unspecified asthma, uncomplicated: Secondary | ICD-10-CM | POA: Diagnosis not present

## 2017-02-26 LAB — TSH: TSH: 0.22 m[IU]/L — AB

## 2017-02-26 MED ORDER — ZOSTER VAC RECOMB ADJUVANTED 50 MCG/0.5ML IM SUSR: INTRAMUSCULAR | 1 refills | Status: DC

## 2017-02-26 NOTE — Patient Instructions (Signed)
A few things to remember from today's visit:   Allergic rhinitis, unspecified seasonality, unspecified trigger  Uncomplicated asthma, unspecified asthma severity, unspecified whether persistent - Plan: Ambulatory referral to Pulmonology  Enlarged thyroid gland - Plan: TSH, US THYROID  I think I can see you annually and as needed since you see rheuma every 3 months and have labs done.   Please be sure medication list is accurate. If a new problem present, please set up appointment sooner than planned today.

## 2017-02-26 NOTE — Assessment & Plan Note (Signed)
She is not on pharmacologic treatment for fibromyalgia. Low impact regular physical activity and good sleep hygiene recommended. Continue following with rheumatologist.

## 2017-02-26 NOTE — Progress Notes (Signed)
HPI:   Patricia Reed is a 57 y.o. female, who is here today to establish care.  Former PCP: Dr Ermalene Searing Last preventive routine visit: Gyn exam 06/2015  Chronic medical problems:  Lupus, fibromyalgia, mixed connective tissue disease, chronic fatigue, vitamin D deficiency.  She follows with Dr Corliss Skains  Currently she is on Methotrexate and Plaquenil for lupus. She receives periodic trigger points injections for fibromyalgia.  She is also reporting that she had a "heart attack" in 2013, she did not tolerated Metoprolol.  Currently she is not on startin medication or Aspirin. She folows with cardiologist, next appt 03/05/16 with Dr Eldorado Lions. Apparently cardiac catheterization showed normal coronary arteries a few years ago.   Per records review she also has history of hypertension and hyperlipidemia, she is not aware of needing one.  Concerns: According to patient, her rheumatologist recommended appointment with pulmonologist because of lupus.  She denies any persistent cough, wheezing, or dyspnea.  She has history of asthma, currently she is on Albuterol inhaler.  She has not had symptoms in a while.  She is also inquiring about vaccination, states that shingles vaccine was recommended but pharmacist refused to give it to her.  She follows with rheumatologist every 3 months and has blood work in each visit.  On examination I noted enlarged thyroid gland.  According to patient, she has had thyroid US in the past and has been stable but she feels like it is growing.  States that she feels like something is pushing towards her throat.  She denies dysphagia or stridor.  Thyroid US in 11/2013: Dominant right-sided nodule, similar in size and appearance to comparison ultrasound, and has been previously biopsied 08/09/2012.  Left-sided nodule below threshold for biopsy. Lab Results  Component Value Date   TSH 0.58 08/19/2016   She is just recovering from a acute viral  illness, URI. According to patient, she received a Toradol injection yesterday and today she feels better.  She was having myalgias and generalized joint pain. She has not had fever or chills.  Review of Systems  Constitutional: Positive for fatigue. Negative for activity change, appetite change and fever.  HENT: Positive for congestion, postnasal drip and rhinorrhea. Negative for mouth sores, nosebleeds, sore throat, trouble swallowing and voice change.   Eyes: Negative for redness and visual disturbance.  Respiratory: Positive for cough. Negative for shortness of breath and wheezing.   Cardiovascular: Negative for chest pain, palpitations and leg swelling.  Gastrointestinal: Negative for abdominal pain, nausea and vomiting.       Negative for changes in bowel habits.  Endocrine: Negative for cold intolerance and heat intolerance.  Genitourinary: Negative for decreased urine volume, difficulty urinating, dysuria and hematuria.  Musculoskeletal: Positive for arthralgias and myalgias. Negative for gait problem.  Skin: Negative for pallor and rash.  Allergic/Immunologic: Positive for environmental allergies.  Neurological: Negative for syncope, weakness and headaches.  Psychiatric/Behavioral: Negative for confusion. The patient is nervous/anxious.       Current Outpatient Medications on File Prior to Visit  Medication Sig Dispense Refill  . acetaminophen (TYLENOL) 650 MG CR tablet Take 650 mg by mouth every 8 (eight) hours as needed for pain.    Marland Kitchen albuterol (PROVENTIL HFA;VENTOLIN HFA) 108 (90 BASE) MCG/ACT inhaler Inhale 2 puffs into the lungs every 6 (six) hours as needed for wheezing or shortness of breath.    . diclofenac sodium (VOLTAREN) 1 % GEL Apply 3 g topically as needed.    . fluticasone (  FLONASE) 50 MCG/ACT nasal spray Place 2 sprays into both nostrils daily. 16 g 6  . folic acid (FOLVITE) 1 MG tablet Take 1 tablet (1 mg total) by mouth 2 (two) times daily. 180 tablet 4  .  hydroxychloroquine (PLAQUENIL) 200 MG tablet Take 200 mg by mouth 2 (two) times daily.    . methotrexate 50 MG/2ML injection Inject 0.8 mLs (20 mg total) into the skin once a week. 10 mL 0  . Tuberculin-Allergy Syringes (ALLERGY SYRINGE 1CC/27GX1/2") 27G X 1/2" 1 ML MISC Patient to use to inject MTX weekly 12 each 3   No current facility-administered medications on file prior to visit.      Past Medical History:  Diagnosis Date  . Anemia   . Asthma   . GERD (gastroesophageal reflux disease)   . History of stomach ulcers   . HTN (hypertension)   . Lupus   . MI (myocardial infarction) (HCC)    2013  . Rheumatoid arthritis (HCC)   . Thyroid mass   . Vertigo    Allergies  Allergen Reactions  . Aspirin Other (See Comments)    GI bleed  . Hydrocodone-Acetaminophen Hives and Nausea And Vomiting  . Ibuprofen Other (See Comments)    GI bleed  . Imdur [Isosorbide Nitrate] Other (See Comments)    Reaction unknown  . Meperidine Hcl Hives and Nausea And Vomiting    Short term memory loss  . Morphine Hives and Nausea And Vomiting  . Oxycodone-Acetaminophen Hives and Nausea And Vomiting    Reaction unknown  . Procaine Hcl Other (See Comments)    Ineffective  . Toprol Xl [Metoprolol] Other (See Comments)    Reaction unknown  . Tramadol Itching    Family History  Problem Relation Age of Onset  . Alcohol abuse Father   . Hypertension Father   . Heart disease Father   . Colon cancer Maternal Uncle   . Diabetes Maternal Grandmother   . Colon polyps Neg Hx   . Esophageal cancer Neg Hx   . Kidney disease Neg Hx   . Stomach cancer Neg Hx   . Rectal cancer Neg Hx     Social History   Socioeconomic History  . Marital status: Married    Spouse name: None  . Number of children: 4  . Years of education: None  . Highest education level: None  Social Needs  . Financial resource strain: None  . Food insecurity - worry: None  . Food insecurity - inability: None  . Transportation  needs - medical: None  . Transportation needs - non-medical: None  Occupational History  . Occupation: Lobbyist: [employer]  Tobacco Use  . Smoking status: Never Smoker  . Smokeless tobacco: Never Used  Substance and Sexual Activity  . Alcohol use: No    Alcohol/week: 0.0 oz  . Drug use: No  . Sexual activity: Yes    Birth control/protection: None, Surgical    Comment: LAVH  Other Topics Concern  . None  Social History Narrative  . None    Vitals:   02/26/17 1511  BP: 122/78  Pulse: 84  Resp: 12  Temp: 97.8 F (36.6 C)  SpO2: 98%    Body mass index is 34.65 kg/m.   Physical Exam  Nursing note and vitals reviewed. Constitutional: She is oriented to person, place, and time. She appears well-developed. No distress.  HENT:  Head: Normocephalic and atraumatic.  Mouth/Throat: Oropharynx is clear and moist  and mucous membranes are normal.  Eyes: Conjunctivae are normal. Pupils are equal, round, and reactive to light.  Neck: No tracheal deviation present. Thyromegaly (Right nodule.) present.  Cardiovascular: Normal rate and regular rhythm.  No murmur heard. Pulses:      Dorsalis pedis pulses are 2+ on the right side, and 2+ on the left side.  Respiratory: Effort normal and breath sounds normal. No respiratory distress.  GI: Soft. She exhibits no mass. There is no hepatomegaly. There is no tenderness.  Musculoskeletal: She exhibits no edema.  Lymphadenopathy:    She has no cervical adenopathy.  Neurological: She is alert and oriented to person, place, and time. She has normal strength. Coordination normal.  Skin: Skin is warm. No erythema.  Psychiatric: She has a normal mood and affect.  Well groomed, good eye contact.    ASSESSMENT AND PLAN:   Patricia Reed was seen today for establish care.  Diagnoses and all orders for this visit: Orders Placed This Encounter  Procedures  . US THYROID  . TSH  . Ambulatory referral to  Pulmonology    Enlarged thyroid gland  She feels like thyroid nodule has grown.  We reviewed prior thyroid US done in 2015. Thyroid US will be arranged.  Allergic rhinitis Symptoms exacerbated by recent URI. Recommend resuming Flonase nasal spray, she has a prescription at her pharmacy that she can pick up today. OTC antihistaminic like Zyrtec 10 mg daily may also help. Nasal irrigation with saline. Follow-up as needed.  Asthma It seems to be well controlled. If needed she can have Albuterol inhaler. Pulmonology referral placed today. According to patient, it was recommended by rheumatologist.  Fibromyalgia She is not on pharmacologic treatment for fibromyalgia. Low impact regular physical activity and good sleep hygiene recommended. Continue following with rheumatologist.  Mixed connective tissue disease, vitamin D deficiency, and chronic fatigue:following with rheumatologist.  She will also keep appointment with her cardiologist.  Other orders -     Zoster Vaccine Adjuvanted [facility]) injection; 0.5 ml in muscle and repeat in 8 weeks        Basim Bartnik G. [place], MD  [facility]. [place] office.

## 2017-02-26 NOTE — Assessment & Plan Note (Addendum)
Symptoms exacerbated by recent URI. Recommend resuming Flonase nasal spray, she has a prescription at her pharmacy that she can pick up today. OTC antihistaminic like Zyrtec 10 mg daily may also help. Nasal irrigation with saline. Follow-up as needed.

## 2017-02-26 NOTE — Assessment & Plan Note (Signed)
It seems to be well controlled. If needed she can have Albuterol inhaler. Pulmonology referral placed today. According to patient, it was recommended by rheumatologist.

## 2017-03-02 NOTE — Telephone Encounter (Signed)
Left a message for a return call.  CRM created.  Need the first day missed from work and the day returned to work.

## 2017-03-03 NOTE — Progress Notes (Deleted)
Office Visit Note  [name]             Date of Birth: 1960/11/13           MRN: [id number]             PCP: Excell Seltzer, MD Referring: Excell Seltzer, MD Visit Date: 03/10/2017 Occupation: @GUAROCC @    Subjective:  No chief complaint on file.   History of Present Illness: Patricia Reed is a 57 y.o. female ***   Activities of Daily Living:  Patient reports morning stiffness for *** {minute/hour:19697}.   Patient {ACTIONS;DENIES/REPORTS:21021675::"Denies"} nocturnal pain.  Difficulty dressing/grooming: {ACTIONS;DENIES/REPORTS:21021675::"Denies"} Difficulty climbing stairs: {ACTIONS;DENIES/REPORTS:21021675::"Denies"} Difficulty getting out of chair: {ACTIONS;DENIES/REPORTS:21021675::"Denies"} Difficulty using hands for taps, buttons, cutlery, and/or writing: {ACTIONS;DENIES/REPORTS:21021675::"Denies"}   No Rheumatology ROS completed.   PMFS History:  Patient Active Problem List   Diagnosis Date Noted  . Dizziness 02/04/2017  . Situational anxiety 06/08/2016  . Atypical chest pain 06/08/2016  . Trapezius muscle spasm 04/23/2016  . Fibromyalgia 02/13/2016  . 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 Chronic fatigue 02/13/2016  . Asthma 11/25/2015  . Vitamin D deficiency 11/25/2015  . Autoimmune disease (HCC) 11/23/2015  . High risk medication use 11/23/2015  . Colon cancer screening 01/25/2014  . Anterior neck pain 12/01/2013  . Inflammatory arthritis 08/18/2012  . Allergic rhinitis 06/23/2012  . HTN (hypertension) 06/23/2012  . High cholesterol 06/23/2012  . Thyroid nodule - benign 06/23/2012  . Arthralgia 06/23/2012  . Mild intermittent asthma   . Sickle cell trait (HCC)   . History of stomach ulcers   . ESOPHAGEAL STRICTURE 10/04/2007  . GERD 10/04/2007  . GASTRITIS, CHRONIC 10/04/2007  . Dysphagia, pharyngoesophageal phase 10/04/2007    Past Medical History:  Diagnosis Date  . Anemia   . Asthma   . GERD (gastroesophageal reflux disease)    . History of stomach ulcers   . HTN (hypertension)   . Lupus   . MI (myocardial infarction) (HCC)    2013  . Rheumatoid arthritis (HCC)   . Thyroid mass   . Vertigo     Family History  Problem Relation Age of Onset  . Alcohol abuse Father   . Hypertension Father   . Heart disease Father   . Colon cancer Maternal Uncle   . Diabetes Maternal Grandmother   . Colon polyps Neg Hx   . Esophageal cancer Neg Hx   . Kidney disease Neg Hx   . Stomach cancer Neg Hx   . Rectal cancer Neg Hx    Past Surgical History:  Procedure Laterality Date  . ABDOMINAL HYSTERECTOMY     done for menorrhagia, ovaries remain  . CERVICAL DISCECTOMY     plates and screws in neck  . CESAREAN SECTION    . COLONOSCOPY    . ESOPHAGEAL MANOMETRY N/A 02/12/2014   Procedure: ESOPHAGEAL MANOMETRY (EM);  Surgeon: 02/14/2014, MD;  Location: [facility];  Service: Endoscopy;  Laterality: N/A;  . ESOPHAGOGASTRODUODENOSCOPY  03/04/2011   Procedure: ESOPHAGOGASTRODUODENOSCOPY (EGD);  Surgeon: 03/06/2011, MD;  Location: [facility];  Service: Endoscopy;  Laterality: N/A;  BOTOX Injection  . exc benign breast lump    . TONSILLECTOMY    . UPPER GASTROINTESTINAL ENDOSCOPY     Social History   Social History Narrative  . Not on file     Objective: Vital Signs: There were no vitals taken for this visit.   Physical Exam   Musculoskeletal Exam: ***  CDAI Exam:  No CDAI exam completed.    Investigation: No additional findings. CBC Latest Ref Rng & Units 02/25/2017 12/30/2016 12/22/2016  WBC 4.0 - 10.5 K/uL 4.0 4.5 3.5(L)  Hemoglobin 12.0 - 15.0 g/dL 98.2 64.1 58.3  Hematocrit 36.0 - 46.0 % 40.1 43.0 38.2  Platelets 150.0 - 400.0 K/uL 266.0 272 266   CMP Latest Ref Rng & Units 12/30/2016 12/22/2016 10/22/2016  Glucose 65 - 99 mg/dL 91 81 -  BUN 6 - 20 mg/dL 11 12 -  Creatinine 0.94 - 1.00 mg/dL 0.76 8.08 -  Sodium 811 - 145 mmol/L 139 142 -  Potassium 3.5 - 5.1 mmol/L 3.4(L) 3.8 -  Chloride 101  - 111 mmol/L 104 104 -  CO2 22 - 32 mmol/L 24 29 -  Calcium 8.9 - 10.3 mg/dL 9.5 9.3 -  Total Protein 6.5 - 8.1 g/dL 0.3(P) 7.3 7.4  Total Bilirubin 0.3 - 1.2 mg/dL 0.9 0.7 -  Alkaline Phos 38 - 126 U/L 65 - -  AST 15 - 41 U/L 19 17 -  ALT 14 - 54 U/L 18 13 -    Imaging: No results found.  Speciality Comments: No specialty comments available.    Procedures:  No procedures performed Allergies: Aspirin; Hydrocodone-acetaminophen; Ibuprofen; Imdur [isosorbide nitrate]; Meperidine hcl; Morphine; Oxycodone-acetaminophen; Procaine hcl; Toprol xl [metoprolol]; and Tramadol   Assessment / Plan:     Visit Diagnoses: Autoimmune disease (HCC)  High risk medication use - PLQ 200 mg BIDPLQ eye examCBC/CMP:  Inflammatory arthritis  Fibromyalgia  Primary insomnia  Chronic fatigue  Trapezius muscle spasm  Thyroid nodule - benign  Esophageal stricture  Mild intermittent asthma, unspecified whether complicated  Essential hypertension  Sickle cell trait (HCC)  History of stomach ulcers  High cholesterol  Vitamin D deficiency    Orders: No orders of the defined types were placed in this encounter.  No orders of the defined types were placed in this encounter.   Face-to-face time spent with patient was *** minutes. 50% of time was spent in counseling and coordination of care.  Follow-Up Instructions: No Follow-up on file.   Gearldine Bienenstock, PA-C  Note - This record has been created using Dragon software.  Chart creation errors have been sought, but may not always  have been located. Such creation errors do not reflect on  the standard of medical care.

## 2017-03-04 NOTE — Telephone Encounter (Signed)
Left a message for a return call.

## 2017-03-08 ENCOUNTER — Other Ambulatory Visit: Payer: Self-pay | Admitting: Family Medicine

## 2017-03-08 DIAGNOSIS — R7989 Other specified abnormal findings of blood chemistry: Secondary | ICD-10-CM

## 2017-03-09 NOTE — Telephone Encounter (Signed)
Left a message for a return call.  Have tried to reach the pt multiple times.  Will now close note.

## 2017-03-10 ENCOUNTER — Ambulatory Visit: Payer: BC Managed Care – PPO | Admitting: Rheumatology

## 2017-03-10 ENCOUNTER — Ambulatory Visit: Payer: BC Managed Care – PPO | Admitting: Physician Assistant

## 2017-03-18 NOTE — Progress Notes (Signed)
appt schedule 03/18

## 2017-03-25 ENCOUNTER — Ambulatory Visit: Payer: BC Managed Care – PPO | Admitting: Emergency Medicine

## 2017-03-25 ENCOUNTER — Encounter: Payer: Self-pay | Admitting: Emergency Medicine

## 2017-03-25 VITALS — BP 124/72 | HR 77 | Ht 65.0 in | Wt 209.0 lb

## 2017-03-25 DIAGNOSIS — J4520 Mild intermittent asthma, uncomplicated: Secondary | ICD-10-CM | POA: Diagnosis not present

## 2017-03-25 DIAGNOSIS — D8989 Other specified disorders involving the immune mechanism, not elsewhere classified: Secondary | ICD-10-CM

## 2017-03-25 DIAGNOSIS — M359 Systemic involvement of connective tissue, unspecified: Secondary | ICD-10-CM

## 2017-03-25 MED ORDER — ALBUTEROL SULFATE HFA 108 (90 BASE) MCG/ACT IN AERS: 2.0000 | INHALATION_SPRAY | RESPIRATORY_TRACT | 5 refills | Status: DC | PRN

## 2017-03-25 NOTE — Assessment & Plan Note (Signed)
Mild symptoms.  She does have albuterol available and used it recently when she had an upper respiratory infection.  We will be able to quantify her degree of obstruction when we do her pulmonary function testing.  Keep albuterol available to use as needed.  She does not need an inhaled corticosteroid at this time.

## 2017-03-25 NOTE — Assessment & Plan Note (Signed)
Mixed connective tissue disease-rheumatoid arthritis +/- lupus.  Her inflammatory disease and her methotrexate both put her at risk for pulmonary disease.  She had a clear chest x-ray 07/01/16.  We will follow her imaging.  If clinical status or imaging change then we will perform a CT scan of the chest.  If she becomes dyspneic then we will also remember to follow echocardiogram as she is at some risk for pulmonary hypertension.

## 2017-03-25 NOTE — Addendum Note (Signed)
Addended by: Jaynee Eagles C on: 03/25/2017 04:56 PM   Modules accepted: Orders

## 2017-03-25 NOTE — Patient Instructions (Signed)
We will perform pulmonary function testing  We will follow your chest x-rays over time.  If you have any change in your breathing or change on chest x-ray then we will perform a CT scan of your chest. Keep albuterol available to use 2 puffs if needed for chest tightness, wheezing, shortness of breath. Follow with Dr Delton Coombes next available with full PFT on the same day.

## 2017-03-25 NOTE — Progress Notes (Signed)
Subjective:    Patient ID: Patricia Reed, female    DOB: July 03, 1960, 57 y.o.   MRN: [id number]  HPI 57 year old never smoker with a history of SLE/Lupus, mixed connective tissue disease versus rheumatoid arthritis, GERD with peptic ulcer disease, hypertension, CAD with a history of MI.  She is on immunosuppressive medication, methotrexate and Plaquenil. Carries a dx of asthma that was made over 20 yrs ago.   She notices some fleeting SOB that can happen at any time, causes her to sigh to get a good breath. No exertional SOB. Can hear wheeze when she has a URI, when the weather changes.  She uses flonase as needed. Very rarely uses albuterol either by HFA or nebs.  Frequent sharp L anterior upper chest pain, she describes a feeling consistent w musculoskeletal. She is planning to see Dr Antoine Poche next week to re-establish care.   Chest x-ray 07/01/16 reviewed, shows no evidence of infiltrate or abnormality.   Review of Systems  Constitutional: Positive for unexpected weight change. Negative for fever.  HENT: Positive for sore throat and trouble swallowing. Negative for congestion, dental problem, ear pain, nosebleeds, postnasal drip, rhinorrhea, sinus pressure and sneezing.   Eyes: Negative for redness and itching.  Respiratory: Negative for cough, chest tightness, shortness of breath and wheezing.   Cardiovascular: Positive for chest pain. Negative for palpitations and leg swelling.  Gastrointestinal: Negative for nausea and vomiting.  Genitourinary: Negative for dysuria.  Musculoskeletal: Negative for joint swelling.  Skin: Negative for rash.  Neurological: Negative for headaches.  Hematological: Does not bruise/bleed easily.  Psychiatric/Behavioral: Negative for dysphoric mood. The patient is not nervous/anxious.    Past Medical History:  Diagnosis Date  . Anemia   . Asthma   . GERD (gastroesophageal reflux disease)   . History of stomach ulcers   . HTN (hypertension)   .  Lupus   . MI (myocardial infarction) (HCC)    2013  . Rheumatoid arthritis (HCC)   . Thyroid mass   . Vertigo      Family History  Problem Relation Age of Onset  . Alcohol abuse Father   . Hypertension Father   . Heart disease Father   . Colon cancer Maternal Uncle   . Diabetes Maternal Grandmother   . Colon polyps Neg Hx   . Esophageal cancer Neg Hx   . Kidney disease Neg Hx   . Stomach cancer Neg Hx   . Rectal cancer Neg Hx      Social History   Socioeconomic History  . Marital status: Married    Spouse name: Not on file  . Number of children: 4  . Years of education: Not on file  . Highest education level: Not on file  Social Needs  . Financial resource strain: Not on file  . Food insecurity - worry: Not on file  . Food insecurity - inability: Not on file  . Transportation needs - medical: Not on file  . Transportation needs - non-medical: Not on file  Occupational History  . Occupation: Lobbyist: [employer]  Tobacco Use  . Smoking status: Never Smoker  . Smokeless tobacco: Never Used  Substance and Sexual Activity  . Alcohol use: No    Alcohol/week: 0.0 oz  . Drug use: No  . Sexual activity: Yes    Birth control/protection: None, Surgical    Comment: LAVH  Other Topics Concern  . Not on file  Social History Narrative  . Not  on file     Allergies  Allergen Reactions  . Aspirin Other (See Comments)    GI bleed  . Hydrocodone-Acetaminophen Hives and Nausea And Vomiting  . Ibuprofen Other (See Comments)    GI bleed  . Imdur [Isosorbide Nitrate] Other (See Comments)    Reaction unknown  . Meperidine Hcl Hives and Nausea And Vomiting    Short term memory loss  . Morphine Hives and Nausea And Vomiting  . Oxycodone-Acetaminophen Hives and Nausea And Vomiting    Reaction unknown  . Procaine Hcl Other (See Comments)    Ineffective  . Toprol Xl [Metoprolol] Other (See Comments)    Reaction unknown  . Tramadol Itching       Outpatient Medications Prior to Visit  Medication Sig Dispense Refill  . acetaminophen (TYLENOL) 650 MG CR tablet Take 650 mg by mouth every 8 (eight) hours as needed for pain.    Marland Kitchen albuterol (PROVENTIL HFA;VENTOLIN HFA) 108 (90 BASE) MCG/ACT inhaler Inhale 2 puffs into the lungs every 6 (six) hours as needed for wheezing or shortness of breath.    . diclofenac sodium (VOLTAREN) 1 % GEL Apply 3 g topically as needed.    . fluticasone (FLONASE) 50 MCG/ACT nasal spray Place 2 sprays into both nostrils daily. 16 g 6  . folic acid (FOLVITE) 1 MG tablet Take 1 tablet (1 mg total) by mouth 2 (two) times daily. 180 tablet 4  . hydroxychloroquine (PLAQUENIL) 200 MG tablet Take 200 mg by mouth 2 (two) times daily.    . methotrexate 50 MG/2ML injection Inject 0.8 mLs (20 mg total) into the skin once a week. 10 mL 0  . Tuberculin-Allergy Syringes (ALLERGY SYRINGE 1CC/27GX1/2") 27G X 1/2" 1 ML MISC Patient to use to inject MTX weekly 12 each 3  . Zoster Vaccine Adjuvanted [facility]) injection 0.5 ml in muscle and repeat in 8 weeks 0.5 mL 1   No facility-administered medications prior to visit.         Objective:   Physical Exam Vitals:   03/25/17 1613 03/25/17 1614  BP:  124/72  Pulse:  77  SpO2:  98%  Weight: 209 lb (94.8 kg)   Height: 5\' 5"  (1.651 m)    Gen: Pleasant, well-nourished, in no distress,  normal affect  ENT: No lesions,  mouth clear,  oropharynx clear, no postnasal drip  Neck: No JVD, no stridor  Lungs: No use of accessory muscles, clear without rales or rhonchi  Cardiovascular: RRR, heart sounds normal, no murmur or gallops, no peripheral edema  Musculoskeletal: No deformities, no cyanosis or clubbing  Neuro: alert, non focal  Skin: Warm, single focus of macular rash, very pale and smooth on her left upper chest     Assessment & Plan:  Mild intermittent asthma Mild symptoms.  She does have albuterol available and used it recently when she had an upper  respiratory infection.  We will be able to quantify her degree of obstruction when we do her pulmonary function testing.  Keep albuterol available to use as needed.  She does not need an inhaled corticosteroid at this time.  Autoimmune disease (HCC) Mixed connective tissue disease-rheumatoid arthritis +/- lupus.  Her inflammatory disease and her methotrexate both put her at risk for pulmonary disease.  She had a clear chest x-ray 07/01/16.  We will follow her imaging.  If clinical status or imaging change then we will perform a CT scan of the chest.  If she becomes dyspneic then we will also remember  to follow echocardiogram as she is at some risk for pulmonary hypertension.  Levy Pupa, MD, PhD 03/25/2017, 4:52 PM [facility] [phone] or if no answer [phone]

## 2017-03-29 ENCOUNTER — Encounter: Payer: Self-pay | Admitting: Cardiology

## 2017-03-29 ENCOUNTER — Telehealth: Payer: Self-pay | Admitting: Cardiology

## 2017-03-29 NOTE — Telephone Encounter (Signed)
Closed Encounter  °

## 2017-03-30 ENCOUNTER — Encounter: Payer: Self-pay | Admitting: Rheumatology

## 2017-03-30 ENCOUNTER — Ambulatory Visit: Payer: BC Managed Care – PPO | Admitting: Rheumatology

## 2017-03-30 VITALS — BP 155/90 | HR 87 | Resp 16 | Ht 65.0 in | Wt 208.0 lb

## 2017-03-30 DIAGNOSIS — M797 Fibromyalgia: Secondary | ICD-10-CM | POA: Diagnosis not present

## 2017-03-30 DIAGNOSIS — Z8709 Personal history of other diseases of the respiratory system: Secondary | ICD-10-CM | POA: Diagnosis not present

## 2017-03-30 DIAGNOSIS — Z8679 Personal history of other diseases of the circulatory system: Secondary | ICD-10-CM

## 2017-03-30 DIAGNOSIS — Z79899 Other long term (current) drug therapy: Secondary | ICD-10-CM | POA: Diagnosis not present

## 2017-03-30 DIAGNOSIS — Z8711 Personal history of peptic ulcer disease: Secondary | ICD-10-CM

## 2017-03-30 DIAGNOSIS — M7062 Trochanteric bursitis, left hip: Secondary | ICD-10-CM

## 2017-03-30 DIAGNOSIS — R5382 Chronic fatigue, unspecified: Secondary | ICD-10-CM | POA: Diagnosis not present

## 2017-03-30 DIAGNOSIS — F5101 Primary insomnia: Secondary | ICD-10-CM | POA: Diagnosis not present

## 2017-03-30 DIAGNOSIS — M7061 Trochanteric bursitis, right hip: Secondary | ICD-10-CM

## 2017-03-30 DIAGNOSIS — M351 Other overlap syndromes: Secondary | ICD-10-CM | POA: Diagnosis not present

## 2017-03-30 DIAGNOSIS — J4520 Mild intermittent asthma, uncomplicated: Secondary | ICD-10-CM | POA: Diagnosis not present

## 2017-03-30 DIAGNOSIS — Z8719 Personal history of other diseases of the digestive system: Secondary | ICD-10-CM

## 2017-03-30 DIAGNOSIS — Z8639 Personal history of other endocrine, nutritional and metabolic disease: Secondary | ICD-10-CM

## 2017-03-30 MED ORDER — PREDNISONE 5 MG PO TABS: ORAL_TABLET | ORAL | 0 refills | Status: DC

## 2017-03-30 MED ORDER — HYDROXYCHLOROQUINE SULFATE 200 MG PO TABS: ORAL_TABLET | ORAL | 0 refills | Status: DC

## 2017-03-30 NOTE — Patient Instructions (Signed)
Standing Labs We placed an order today for your standing lab work.    Please come back and get your standing labs in June and every 3 months  We have open lab Monday through Friday from 8:30-11:30 AM and 1:30-4 PM at the office of Dr. Arriel Victor.   The office is located at [address], [address], [place], [place] [address] No appointment is necessary.   Labs are drawn by [facility].  You may receive a bill from [facility] for your lab work. If you have any questions regarding directions or hours of operation,  please call [phone].    

## 2017-03-30 NOTE — Progress Notes (Signed)
Office Visit Note  [name]             Date of Birth: 1960/03/19           MRN: [id number]             PCP: [place], [name], MD Referring: [place], [name], MD Visit Date: 03/30/2017 Occupation: @GUAROCC @    Subjective:  Pain and swelling in multiple joints  History of Present Illness: Patricia Reed is a 57 y.o. female with history of mixed connective tissue disease and fibromyalgia.  Patient reports that she started having a flare of arthritis about 1-1/2-week ago.  She has been off Plaquenil for 2 months as she could not get her eye exam.  She has been taking methotrexate on a regular basis.  She has been having pain and swelling in her bilateral hands and her knee joints.  She continues to have discomfort in the trapezius area and left trochanteric bursa.  She also  complaints of pain and discomfort in her shoulders and her feet.  She has been experiencing increased fatigue.  She states due to severe pain she has been experiencing nausea.   Activities of Daily Living:  Patient reports morning stiffness for 1-2 hours.   Patient Reports nocturnal pain.  Difficulty dressing/grooming: Denies Difficulty climbing stairs: Reports Difficulty getting out of chair: Reports Difficulty using hands for taps, buttons, cutlery, and/or writing: Reports   Review of Systems  Constitutional: Positive for fatigue. Negative for weakness.  HENT: Positive for nose dryness. Negative for mouth sores, sore throat, trouble swallowing, trouble swallowing and mouth dryness.   Eyes: Negative for pain, redness, visual disturbance and dryness.  Respiratory: Negative for cough, hemoptysis, shortness of breath and difficulty breathing.   Cardiovascular: Negative for chest pain, palpitations, hypertension, irregular heartbeat and swelling in legs/feet.  Gastrointestinal: Positive for nausea. Negative for blood in stool, constipation and diarrhea.  Endocrine: Negative for increased  urination.  Genitourinary: Negative for painful urination.  Musculoskeletal: Positive for arthralgias, joint pain, joint swelling, myalgias, morning stiffness, muscle tenderness and myalgias. Negative for muscle weakness.  Skin: Positive for color change (Raynaud's ). Negative for pallor, rash, hair loss, nodules/bumps, redness, skin tightness, ulcers and sensitivity to sunlight.  Allergic/Immunologic: Negative for susceptible to infections.  Neurological: Negative for dizziness, numbness and headaches.  Hematological: Negative for swollen glands.  Psychiatric/Behavioral: Positive for sleep disturbance. Negative for depressed mood. The patient is not nervous/anxious.     PMFS History:  Patient Active Problem List   Diagnosis Date Noted  . Dizziness 02/04/2017  . Situational anxiety 06/08/2016  . Atypical chest pain 06/08/2016  . Trapezius muscle spasm 04/23/2016  . Fibromyalgia 02/13/2016  . 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 Chronic fatigue 02/13/2016  . Vitamin D deficiency 11/25/2015  . Autoimmune disease (HCC) 11/23/2015  . High risk medication use 11/23/2015  . Colon cancer screening 01/25/2014  . Anterior neck pain 12/01/2013  . Inflammatory arthritis 08/18/2012  . Allergic rhinitis 06/23/2012  . HTN (hypertension) 06/23/2012  . High cholesterol 06/23/2012  . Thyroid nodule - benign 06/23/2012  . Arthralgia 06/23/2012  . Mild intermittent asthma   . Sickle cell trait (HCC)   . History of stomach ulcers   . ESOPHAGEAL STRICTURE 10/04/2007  . GERD 10/04/2007  . GASTRITIS, CHRONIC 10/04/2007  . Dysphagia, pharyngoesophageal phase 10/04/2007    Past Medical History:  Diagnosis Date  . Anemia   . Asthma   . GERD (gastroesophageal reflux disease)   .  History of stomach ulcers   . HTN (hypertension)   . Lupus   . MI (myocardial infarction) (HCC)    2013  . Rheumatoid arthritis (HCC)   . Thyroid mass   . Vertigo     Family History  Problem Relation Age of Onset  .  Alcohol abuse Father   . Hypertension Father   . Heart disease Father   . Colon cancer Maternal Uncle   . Diabetes Maternal Grandmother   . Colon polyps Neg Hx   . Esophageal cancer Neg Hx   . Kidney disease Neg Hx   . Stomach cancer Neg Hx   . Rectal cancer Neg Hx    Past Surgical History:  Procedure Laterality Date  . ABDOMINAL HYSTERECTOMY     done for menorrhagia, ovaries remain  . CERVICAL DISCECTOMY     plates and screws in neck  . CESAREAN SECTION    . COLONOSCOPY    . ESOPHAGEAL MANOMETRY N/A 02/12/2014   Procedure: ESOPHAGEAL MANOMETRY (EM);  Surgeon: Louis Meckel, MD;  Location: [facility];  Service: Endoscopy;  Laterality: N/A;  . ESOPHAGOGASTRODUODENOSCOPY  03/04/2011   Procedure: ESOPHAGOGASTRODUODENOSCOPY (EGD);  Surgeon: Louis Meckel, MD;  Location: [facility];  Service: Endoscopy;  Laterality: N/A;  BOTOX Injection  . exc benign breast lump    . TONSILLECTOMY    . UPPER GASTROINTESTINAL ENDOSCOPY     Social History   Social History Narrative  . Not on file     Objective: Vital Signs: BP (!) 155/90 (BP Location: Left Arm, Patient Position: Sitting, Cuff Size: Normal)   Pulse 87   Resp 16   Ht 5\' 5"  (1.651 m)   Wt 208 lb (94.3 kg)   BMI 34.61 kg/m    Physical Exam  Constitutional: She is oriented to person, place, and time. She appears well-developed and well-nourished.  HENT:  Head: Normocephalic and atraumatic.  Eyes: Conjunctivae and EOM are normal.  Neck: Normal range of motion.  Cardiovascular: Normal rate, regular rhythm, normal heart sounds and intact distal pulses.  Pulmonary/Chest: Effort normal and breath sounds normal.  Abdominal: Soft. Bowel sounds are normal.  Lymphadenopathy:    She has no cervical adenopathy.  Neurological: She is alert and oriented to person, place, and time.  Skin: Skin is warm and dry. Capillary refill takes less than 2 seconds.  Psychiatric: She has a normal mood and affect. Her behavior is normal.    Nursing note and vitals reviewed.    Musculoskeletal Exam: C-spine thoracic lumbar spine good range of motion.  Shoulder joints elbow joints with good range of motion.  She had bilateral extensor tenosynovitis and synovitis in the MCPs and PIP joints as described below.  She has warmth and swelling in her right knee joint.  She also had generalized hyperalgesia from fibromyalgia.  CDAI Exam: CDAI Homunculus Exam:   Tenderness:  RUE: wrist LUE: wrist Right hand: 2nd MCP and 3rd MCP RLE: tibiofemoral  Swelling:  RUE: wrist LUE: wrist Right hand: 2nd MCP and 3rd MCP RLE: tibiofemoral  Joint Counts:  CDAI Tender Joint count: 5 CDAI Swollen Joint count: 5  Global Assessments:  Patient Global Assessment: 6 Provider Global Assessment: 6  CDAI Calculated Score: 22    Investigation: No additional findings. CBC Latest Ref Rng & Units 02/25/2017 12/30/2016 12/22/2016  WBC 4.0 - 10.5 K/uL 4.0 4.5 3.5(L)  Hemoglobin 12.0 - 15.0 g/dL 14/04/2016 58.0 99.8  Hematocrit 36.0 - 46.0 % 40.1 43.0 38.2  Platelets 150.0 -  400.0 K/uL 266.0 272 266   CMP Latest Ref Rng & Units 12/30/2016 12/22/2016 10/22/2016  Glucose 65 - 99 mg/dL 91 81 -  BUN 6 - 20 mg/dL 11 12 -  Creatinine 1.02 - 1.00 mg/dL 5.85 2.77 -  Sodium 824 - 145 mmol/L 139 142 -  Potassium 3.5 - 5.1 mmol/L 3.4(L) 3.8 -  Chloride 101 - 111 mmol/L 104 104 -  CO2 22 - 32 mmol/L 24 29 -  Calcium 8.9 - 10.3 mg/dL 9.5 9.3 -  Total Protein 6.5 - 8.1 g/dL 2.3(N) 7.3 7.4  Total Bilirubin 0.3 - 1.2 mg/dL 0.9 0.7 -  Alkaline Phos 38 - 126 U/L 65 - -  AST 15 - 41 U/L 19 17 -  ALT 14 - 54 U/L 18 13 -    Imaging: No results found.  Speciality Comments: No specialty comments available.    Procedures:  No procedures performed Allergies: Aspirin; Hydrocodone-acetaminophen; Ibuprofen; Imdur [isosorbide nitrate]; Meperidine hcl; Morphine; Oxycodone-acetaminophen; Procaine hcl; Toprol xl [metoprolol]; and Tramadol   Assessment / Plan:      Visit Diagnoses: Mixed connective tissue disease (HCC) - +ANA,+Sm,+RNP,+RF. -Patient is having a flare.  She has been off Plaquenil for 2 months.  She has been noncompliant with her eye exams.  We had detailed discussion regarding that.  She states she has been taking methotrexate on a regular basis.  The risk of not taking medications on a regular basis and also not monitoring toxicity was discussed at length.  As she is having a flare of given her prescription for prednisone taper as described below.  I have also given a prescription refill for Plaquenil for 30 days.  She was given an eye exam form to get eye exam this week.  I will obtain following labs today.  Plan: CBC with Differential/Platelet, COMPLETE METABOLIC PANEL WITH GFR, Urinalysis, Routine w reflex microscopic, ANA, Anti-DNA antibody, double-stranded, C3 and C4, Sedimentation rate, VITAMIN D 25 Hydroxy (Vit-D Deficiency, Fractures), Urinalysis, Routine w reflex microscopic, Anti-DNA antibody, double-stranded, C3 and C4, Sedimentation rate, ANA  High risk medication use - PLQ ran out 2 months ago, MTX 0.8 ml sq q wk , folic acid 2 mg po qd eye exam is due - Plan: CBC with Differential/Platelet, COMPLETE METABOLIC PANEL WITH GFR, CBC with Differential/Platelet, COMPLETE METABOLIC PANEL WITH GFR.  We will check labs today and then every 3 months to monitor for drug toxicity.  Fibromyalgia: She has generalized pain and discomfort with positive tender points.  Chronic fatigue: Related to insomnia.  Primary insomnia: Good sleep hygiene was discussed.  Trochanteric bursitis of both hips: She continues to have trochanteric bursa pain due to fibromyalgia.  History of vitamin D deficiency - Plan: VITAMIN D 25 Hydroxy (Vit-D Deficiency, Fractures).  She is on supplement.  History of high cholesterol  History of asthma - Plan: VITAMIN D 25 Hydroxy (Vit-D Deficiency, Fractures)  Other medical problems are listed as follows:  History of  stomach ulcers  History of hypertension  History of gastroesophageal reflux (GERD)  Mild intermittent asthma without complication - Plan: Pulmonary function test     Orders: Orders Placed This Encounter  Procedures  . CBC with Differential/Platelet  . COMPLETE METABOLIC PANEL WITH GFR  . Urinalysis, Routine w reflex microscopic  . ANA  . Anti-DNA antibody, double-stranded  . C3 and C4  . Sedimentation rate  . VITAMIN D 25 Hydroxy (Vit-D Deficiency, Fractures)   Meds ordered this encounter  Medications  . predniSONE (DELTASONE) 5  MG tablet    Sig: Take 4 tabs po qd x1 week, 3 tabs po qd x1 week, 2 tabs po qd x1 week, 1 tab po qd x1 week, 1/2 tab po qd x1 week.    Dispense:  74 tablet    Refill:  0  . hydroxychloroquine (PLAQUENIL) 200 MG tablet    Sig: Take 200mg  by mouth BID, Monday- Friday only.    Dispense:  40 tablet    Refill:  0    Face-to-face time spent with patient was 40 minutes.Greater than 50% of time was spent in counseling and coordination of care.  Follow-Up Instructions: Return for mixed connective tissue disease , Fibromyalgia.   Pollyann Savoy, MD  Note - This record has been created using Animal nutritionist.  Chart creation errors have been sought, but may not always  have been located. Such creation errors do not reflect on  the standard of medical care.

## 2017-03-31 ENCOUNTER — Telehealth: Payer: Self-pay | Admitting: Emergency Medicine

## 2017-03-31 LAB — URINALYSIS, ROUTINE W REFLEX MICROSCOPIC
BACTERIA UA: NONE SEEN /HPF
Bilirubin Urine: NEGATIVE
Glucose, UA: NEGATIVE
HYALINE CAST: NONE SEEN /LPF
Hgb urine dipstick: NEGATIVE
Ketones, ur: NEGATIVE
Nitrite: NEGATIVE
Protein, ur: NEGATIVE
RBC / HPF: NONE SEEN /HPF (ref 0–2)
Specific Gravity, Urine: 1.021 (ref 1.001–1.03)
pH: 7 (ref 5.0–8.0)

## 2017-03-31 LAB — C3 AND C4
C3 Complement: 201 mg/dL — ABNORMAL HIGH (ref 83–193)
C4 Complement: 39 mg/dL (ref 15–57)

## 2017-03-31 LAB — COMPLETE METABOLIC PANEL WITH GFR
AG RATIO: 1.5 (calc) (ref 1.0–2.5)
ALBUMIN MSPROF: 4.5 g/dL (ref 3.6–5.1)
ALT: 13 U/L (ref 6–29)
AST: 16 U/L (ref 10–35)
Alkaline phosphatase (APISO): 65 U/L (ref 33–130)
BILIRUBIN TOTAL: 1 mg/dL (ref 0.2–1.2)
BUN: 11 mg/dL (ref 7–25)
CALCIUM: 9.2 mg/dL (ref 8.6–10.4)
CHLORIDE: 102 mmol/L (ref 98–110)
CO2: 30 mmol/L (ref 20–32)
Creat: 0.63 mg/dL (ref 0.50–1.05)
GFR, EST AFRICAN AMERICAN: 118 mL/min/{1.73_m2} (ref >=60)
GFR, EST NON AFRICAN AMERICAN: 102 mL/min/{1.73_m2} (ref >=60)
GLOBULIN: 3.1 g/dL (ref 1.9–3.7)
Glucose, Bld: 86 mg/dL (ref 65–99)
POTASSIUM: 3.8 mmol/L (ref 3.5–5.3)
SODIUM: 139 mmol/L (ref 135–146)
TOTAL PROTEIN: 7.6 g/dL (ref 6.1–8.1)

## 2017-03-31 LAB — ANTI-DNA ANTIBODY, DOUBLE-STRANDED: ds DNA Ab: <1 IU/mL

## 2017-03-31 LAB — ANTI-NUCLEAR AB-TITER (ANA TITER)

## 2017-03-31 LAB — CBC WITH DIFFERENTIAL/PLATELET
BASOS PCT: 0.8 %
Basophils Absolute: 29 cells/uL (ref 0–200)
EOS ABS: 50 {cells}/uL (ref 15–500)
Eosinophils Relative: 1.4 %
HEMATOCRIT: 40.1 % (ref 35.0–45.0)
Hemoglobin: 13.3 g/dL (ref 11.7–15.5)
LYMPHS ABS: 648 {cells}/uL — AB (ref 850–3900)
MCH: 27.9 pg (ref 27.0–33.0)
MCHC: 33.2 g/dL (ref 32.0–36.0)
MCV: 84.1 fL (ref 80.0–100.0)
MPV: 11.1 fL (ref 7.5–12.5)
Monocytes Relative: 5.3 %
Neutro Abs: 2682 cells/uL (ref 1500–7800)
Neutrophils Relative %: 74.5 %
PLATELETS: 256 10*3/uL (ref 140–400)
RBC: 4.77 10*6/uL (ref 3.80–5.10)
RDW: 13.8 % (ref 11.0–15.0)
TOTAL LYMPHOCYTE: 18 %
WBC: 3.6 10*3/uL — ABNORMAL LOW (ref 3.8–10.8)
WBCMIX: 191 {cells}/uL — AB (ref 200–950)

## 2017-03-31 LAB — VITAMIN D 25 HYDROXY (VIT D DEFICIENCY, FRACTURES): VIT D 25 HYDROXY: 36 ng/mL (ref 30–100)

## 2017-03-31 LAB — SEDIMENTATION RATE: SED RATE: 39 mm/h — AB (ref 0–30)

## 2017-03-31 LAB — ANA: Anti Nuclear Antibody(ANA): POSITIVE — AB

## 2017-03-31 NOTE — Telephone Encounter (Signed)
Noted  

## 2017-03-31 NOTE — Progress Notes (Signed)
Please advise patient to take her PLQ, MTX, and Prednisone.  Labs reveal she is having a flare.   She was started on a Prednisone taper at her visit yesterday.  Discussed labs with Dr. Corliss Skains and she would like the patient to return in 1 month.

## 2017-04-05 ENCOUNTER — Ambulatory Visit: Payer: BC Managed Care – PPO | Admitting: Cardiology

## 2017-04-07 ENCOUNTER — Encounter: Payer: Self-pay | Admitting: Cardiology

## 2017-04-13 ENCOUNTER — Ambulatory Visit: Payer: BC Managed Care – PPO | Admitting: Family Medicine

## 2017-04-13 ENCOUNTER — Encounter: Payer: Self-pay | Admitting: *Deleted

## 2017-04-13 ENCOUNTER — Encounter: Payer: Self-pay | Admitting: Family Medicine

## 2017-04-13 VITALS — BP 122/80 | HR 64 | Temp 98.3°F | Resp 12 | Ht 65.0 in | Wt 210.4 lb

## 2017-04-13 DIAGNOSIS — R1013 Epigastric pain: Secondary | ICD-10-CM

## 2017-04-13 DIAGNOSIS — K2940 Chronic atrophic gastritis without bleeding: Secondary | ICD-10-CM

## 2017-04-13 DIAGNOSIS — A6000 Herpesviral infection of urogenital system, unspecified: Secondary | ICD-10-CM | POA: Diagnosis not present

## 2017-04-13 MED ORDER — PANTOPRAZOLE SODIUM 40 MG PO TBEC: 40.0000 mg | DELAYED_RELEASE_TABLET | Freq: Every day | ORAL | 1 refills | Status: DC

## 2017-04-13 MED ORDER — VALACYCLOVIR HCL 500 MG PO TABS: 500.0000 mg | ORAL_TABLET | Freq: Two times a day (BID) | ORAL | 1 refills | Status: DC

## 2017-04-13 NOTE — Progress Notes (Signed)
ACUTE VISIT   HPI:  Chief Complaint  Patient presents with  . Stomach Ulcers    having a flare-up    Patricia Reed is a 57 y.o. female, who is here today complaining of 5 days of epigastric abdominal pain. She attributes problem to PUD and possible GI bleed.  Gradual onset of pain, "toothache" like pain, no radiated, 8/10, constant, and getting worse.  She had black stools 3 days ago.  Hx of inflammatory arthritis, started on Prednisone a week ago. She is also on abx treatment.Marland Kitchen Hx of GERD and esophageal strictures. She has an appt with GI 05/2017.  She has not tried OTC treatment.    Also requesting antiviral medication for recurrent lesions on her buttocks.  Genital herpes, q 3-4 months. Last episode a week ago already healing.  She has taken Valtrex before.   Review of Systems  Constitutional: Positive for fatigue. Negative for activity change, appetite change, fever and unexpected weight change.  HENT: Negative for mouth sores, sore throat and trouble swallowing.   Respiratory: Negative for cough, shortness of breath and wheezing.   Gastrointestinal: Positive for abdominal pain. Negative for abdominal distention, nausea and vomiting.       No changes in bowel habits.  Endocrine: Negative for cold intolerance and heat intolerance.  Genitourinary: Negative for dysuria, frequency, hematuria, menstrual problem, vaginal bleeding and vaginal discharge.  Musculoskeletal: Positive for arthralgias. Negative for myalgias.  Skin: Positive for rash. Negative for wound.  Allergic/Immunologic: Negative for food allergies.  Neurological: Negative for weakness, numbness and headaches.  Hematological: Negative for adenopathy. Does not bruise/bleed easily.  Psychiatric/Behavioral: Negative for confusion. The patient is nervous/anxious.       Current Outpatient Medications on File Prior to Visit  Medication Sig Dispense Refill  . acetaminophen (TYLENOL) 650 MG  CR tablet Take 650 mg by mouth every 8 (eight) hours as needed for pain.    Marland Kitchen albuterol (PROAIR HFA) 108 (90 Base) MCG/ACT inhaler Inhale 2 puffs into the lungs every 4 (four) hours as needed for wheezing or shortness of breath. 1 Inhaler 5  . diclofenac sodium (VOLTAREN) 1 % GEL Apply 3 g topically as needed.    . fluticasone (FLONASE) 50 MCG/ACT nasal spray Place 2 sprays into both nostrils daily. 16 g 6  . folic acid (FOLVITE) 1 MG tablet Take 1 tablet (1 mg total) by mouth 2 (two) times daily. 180 tablet 4  . hydroxychloroquine (PLAQUENIL) 200 MG tablet Take 200mg  by mouth BID, Monday- Friday only. (Patient taking differently: Take 200 mg by mouth 2 (two) times daily. Take 200mg  by mouth BID, Monday- Friday only.) 40 tablet 0  . methotrexate 50 MG/2ML injection Inject 0.8 mLs (20 mg total) into the skin once a week. (Patient taking differently: Inject 20 mg into the skin once a week. Sunday) 10 mL 0  . predniSONE (DELTASONE) 5 MG tablet Take 4 tabs po qd x1 week, 3 tabs po qd x1 week, 2 tabs po qd x1 week, 1 tab po qd x1 week, 1/2 tab po qd x1 week. 74 tablet 0  . Tuberculin-Allergy Syringes (ALLERGY SYRINGE 1CC/27GX1/2") 27G X 1/2" 1 ML MISC Patient to use to inject MTX weekly 12 each 3   No current facility-administered medications on file prior to visit.      Past Medical History:  Diagnosis Date  . Anemia   . Asthma   . GERD (gastroesophageal reflux disease)   . History of stomach ulcers   .  Lupus   . MI, old   . Rheumatoid arthritis (HCC)   . Thyroid mass   . Vertigo    Allergies  Allergen Reactions  . Aspirin Other (See Comments)    GI bleed  . Hydrocodone-Acetaminophen Hives and Nausea And Vomiting  . Ibuprofen Other (See Comments)    GI bleed  . Imdur [Isosorbide Nitrate] Other (See Comments)    Reaction unknown  . Meperidine Hcl Hives and Nausea And Vomiting    Short term memory loss  . Morphine Hives and Nausea And Vomiting  . Oxycodone-Acetaminophen Hives and  Nausea And Vomiting    Reaction unknown  . Procaine Hcl Other (See Comments)    Ineffective  . Toprol Xl [Metoprolol] Other (See Comments)    Reaction unknown  . Tramadol Itching    Social History   Socioeconomic History  . Marital status: Married    Spouse name: Not on file  . Number of children: 4  . Years of education: Not on file  . Highest education level: Not on file  Occupational History  . Occupation: Lobbyist: [employer]  Social Needs  . Financial resource strain: Not on file  . Food insecurity:    Worry: Not on file    Inability: Not on file  . Transportation needs:    Medical: Not on file    Non-medical: Not on file  Tobacco Use  . Smoking status: Never Smoker  . Smokeless tobacco: Never Used  Substance and Sexual Activity  . Alcohol use: No    Alcohol/week: 0.0 oz  . Drug use: No  . Sexual activity: Yes    Birth control/protection: None, Surgical    Comment: LAVH  Lifestyle  . Physical activity:    Days per week: Not on file    Minutes per session: Not on file  . Stress: Not on file  Relationships  . Social connections:    Talks on phone: Not on file    Gets together: Not on file    Attends religious service: Not on file    Active member of club or organization: Not on file    Attends meetings of clubs or organizations: Not on file    Relationship status: Not on file  Other Topics Concern  . 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      Vitals:   04/13/17 0734  BP: 122/80  Pulse: 64  Resp: 12  Temp: 98.3 F (36.8 C)  SpO2: 97%   Body mass index is 35.01 kg/m.    Physical Exam  Nursing note and vitals reviewed. Constitutional: She is oriented to person, place, and time. She appears well-developed and well-nourished. She does not appear ill. No distress.  HENT:  Head: Normocephalic and atraumatic.  Mouth/Throat: Oropharynx is  clear and moist and mucous membranes are normal.  Eyes: Conjunctivae are normal. No scleral icterus.  Cardiovascular: Normal rate and regular rhythm.  No murmur heard. Respiratory: Effort normal and breath sounds normal. No respiratory distress.  GI: Soft. Bowel sounds are normal. She exhibits no distension and no mass. There is no hepatomegaly. There is tenderness in the epigastric area. There is no rebound and no guarding.  Musculoskeletal: She exhibits no edema.  Lymphadenopathy:    She has no cervical adenopathy.  Neurological: She is alert and oriented to person, place, and time. She has normal strength.  Skin:  Skin is warm. Rash noted. Rash is macular. Rash is not vesicular. No erythema.  On Medial aspect of left gluteus, under coccyx  with hypopigmentation macular lesions, mild erythema and tenderness upon palpation. No vesicular lesions appreciated.  Psychiatric: She has a normal mood and affect.  Well groomed, good eye contact.    ASSESSMENT AND PLAN:  Ms. Perlie was seen today for stomach ulcers.  Diagnoses and all orders for this visit:  Epigastric abdominal pain  Possible etiologies discussed. Hx of gastritis and recently started on abx and Predniosne. Protonix recommended. Instructed about warning signs. F/U in 3 weeks.  -     pantoprazole (PROTONIX) 40 MG tablet; Take 1 tablet (40 mg total) by mouth daily.  Atrophic gastritis, presence of bleeding unspecified  Guaiac today was negative. Protonix started. GERD precautions recommended.        She is on Methotrexate , so recommend let her rheumatologist know that PPI has been started.       Keep appt with GI in 05/2017.  -     pantoprazole (PROTONIX) 40 MG tablet; Take 1 tablet (40 mg total) by mouth daily.  Recurrent genital herpes  Recent episode with lesions healing, post inflammatory skin pigmentation changes. Valtrex to continue for future episodes.  -     valACYclovir (VALTREX) 500 MG tablet; Take 1  tablet (500 mg total) by mouth 2 (two) times daily. For 3 days with acute episodes.    Return in about 3 weeks (around 05/04/2017).     -Ms.Karlynn J Weiler was advised to seek immediate medical attention if sudden worsening symptoms.      [name]. [place], MD  [facility]. [place] office.

## 2017-04-13 NOTE — Patient Instructions (Signed)
A few things to remember from today's visit:   Epigastric abdominal pain - Plan: pantoprazole (PROTONIX) 40 MG tablet  Atrophic gastritis, presence of bleeding unspecified - Plan: pantoprazole (PROTONIX) 40 MG tablet  Recurrent genital herpes  Please let your rheumatologist know Protonix was started. May need to decrease Methotrexate dose.     Please be sure medication list is accurate. If a new problem present, please set up appointment sooner than planned today.

## 2017-04-14 ENCOUNTER — Encounter: Payer: Self-pay | Admitting: Cardiology

## 2017-04-14 NOTE — Progress Notes (Deleted)
Office Visit Note  [name]             Date of Birth: 08-Nov-1960           MRN: [id number]             PCP: [place], [name], MD Referring: [place], [name], MD Visit Date: 04/28/2017 Occupation: @GUAROCC @    Subjective:  No chief complaint on file.   History of Present Illness: Patricia Reed is a 57 y.o. female ***   Activities of Daily Living:  Patient reports morning stiffness for *** {minute/hour:19697}.   Patient {ACTIONS;DENIES/REPORTS:21021675::"Denies"} nocturnal pain.  Difficulty dressing/grooming: {ACTIONS;DENIES/REPORTS:21021675::"Denies"} Difficulty climbing stairs: {ACTIONS;DENIES/REPORTS:21021675::"Denies"} Difficulty getting out of chair: {ACTIONS;DENIES/REPORTS:21021675::"Denies"} Difficulty using hands for taps, buttons, cutlery, and/or writing: {ACTIONS;DENIES/REPORTS:21021675::"Denies"}   No Rheumatology ROS completed.   PMFS History:  Patient Active Problem List   Diagnosis Date Noted  . Dizziness 02/04/2017  . Situational anxiety 06/08/2016  . Atypical chest pain 06/08/2016  . Trapezius muscle spasm 04/23/2016  . Fibromyalgia 02/13/2016  . 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 Chronic fatigue 02/13/2016  . Vitamin D deficiency 11/25/2015  . Autoimmune disease (HCC) 11/23/2015  . High risk medication use 11/23/2015  . Colon cancer screening 01/25/2014  . Anterior neck pain 12/01/2013  . Inflammatory arthritis 08/18/2012  . Allergic rhinitis 06/23/2012  . HTN (hypertension) 06/23/2012  . High cholesterol 06/23/2012  . Thyroid nodule - benign 06/23/2012  . Arthralgia 06/23/2012  . Mild intermittent asthma   . Sickle cell trait (HCC)   . History of stomach ulcers   . ESOPHAGEAL STRICTURE 10/04/2007  . GERD 10/04/2007  . 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 Dysphagia, pharyngoesophageal phase 10/04/2007    Past Medical History:  Diagnosis Date  . Anemia   . Asthma   . GERD (gastroesophageal reflux disease)   . History of  stomach ulcers   . HTN (hypertension)   . Lupus   . MI (myocardial infarction) (HCC)    2013  . Rheumatoid arthritis (HCC)   . Thyroid mass   . Vertigo     Family History  Problem Relation Age of Onset  . Alcohol abuse Father   . Hypertension Father   . Heart disease Father   . Colon cancer Maternal Uncle   . Diabetes Maternal Grandmother   . Colon polyps Neg Hx   . Esophageal cancer Neg Hx   . Kidney disease Neg Hx   . Stomach cancer Neg Hx   . Rectal cancer Neg Hx    Past Surgical History:  Procedure Laterality Date  . ABDOMINAL HYSTERECTOMY     done for menorrhagia, ovaries remain  . CERVICAL DISCECTOMY     plates and screws in neck  . CESAREAN SECTION    . COLONOSCOPY    . ESOPHAGEAL MANOMETRY N/A 02/12/2014   Procedure: ESOPHAGEAL MANOMETRY (EM);  Surgeon: 02/14/2014, MD;  Location: [facility];  Service: Endoscopy;  Laterality: N/A;  . ESOPHAGOGASTRODUODENOSCOPY  03/04/2011   Procedure: ESOPHAGOGASTRODUODENOSCOPY (EGD);  Surgeon: 03/06/2011, MD;  Location: [facility];  Service: Endoscopy;  Laterality: N/A;  BOTOX Injection  . exc benign breast lump    . TONSILLECTOMY    . UPPER GASTROINTESTINAL ENDOSCOPY     Social History   Social History Narrative  . Not on file     Objective: Vital Signs: There were no vitals taken for this visit.   Physical Exam   Musculoskeletal Exam: ***  CDAI Exam: No CDAI exam completed.  Investigation: No additional findings. CBC Latest Ref Rng & Units 03/30/2017 02/25/2017 12/30/2016  WBC 3.8 - 10.8 Thousand/uL 3.6(L) 4.0 4.5  Hemoglobin 11.7 - 15.5 g/dL 78.2 42.3 53.6  Hematocrit 35.0 - 45.0 % 40.1 40.1 43.0  Platelets 140 - 400 Thousand/uL 256 266.0 272   CMP Latest Ref Rng & Units 03/30/2017 12/30/2016 12/22/2016  Glucose 65 - 99 mg/dL 86 91 81  BUN 7 - 25 mg/dL 11 11 12   Creatinine 0.50 - 1.05 mg/dL 1.44 3.15  Sodium 135 - 146 mmol/L 139 139 142  Potassium 3.5 - 5.3 mmol/L 3.8 3.4(L) 3.8  Chloride  98 - 110 mmol/L 102 104 104  CO2 20 - 32 mmol/L 30 24 29   Calcium 8.6 - 10.4 mg/dL 9.2 9.5 9.3  Total Protein 6.1 - 8.1 g/dL 7.6 4.00) 7.3  Total Bilirubin 0.2 - 1.2 mg/dL 1.0 0.9 0.7  Alkaline Phos 38 - 126 U/L - 65 -  AST 10 - 35 U/L 16 19 17   ALT 6 - 29 U/L 13 18 13     Imaging: No results found.  Speciality Comments: No specialty comments available.    Procedures:  No procedures performed Allergies: Aspirin; Hydrocodone-acetaminophen; Ibuprofen; Imdur [isosorbide nitrate]; Meperidine hcl; Morphine; Oxycodone-acetaminophen; Procaine hcl; Toprol xl [metoprolol]; and Tramadol   Assessment / Plan:     Visit Diagnoses: Mixed connective tissue disease (HCC) - +ANA,+Sm,+RNP,+RF  High risk medication use - MTX 0.8 ml weekly, folic acid 2 mg daily, PLQ PLQ eye exam CBC/CMP:   Fibromyalgia  Chronic fatigue  Primary insomnia  Trochanteric bursitis of both hips  History of vitamin D deficiency  History of high cholesterol  History of asthma  History of stomach ulcers  History of hypertension  History of gastroesophageal reflux (GERD)    Orders: No orders of the defined types were placed in this encounter.  No orders of the defined types were placed in this encounter.   Face-to-face time spent with patient was *** minutes. 50% of time was spent in counseling and coordination of care.  Follow-Up Instructions: No follow-ups on file.   , PA-C  Note - This record has been created using Dragon software.  Chart creation errors have been sought, but may not always  have been located. Such creation errors do not reflect on  the standard of medical care.

## 2017-04-14 NOTE — Progress Notes (Signed)
Cardiology Office Note   Date:  04/15/2017   ID:  Patricia Reed, DOB Mar 18, 1960, MRN [id number]  PCP:  Reed, Patricia G, MD  Cardiologist:   No primary care provider on file. Referring:  Dr. Corliss Reed, [facility]  Chief Complaint  Patient presents with  . Shortness of Breath  . Chest Pain      History of Present Illness: Patricia Reed is a 57 y.o. female who presents for evaluation of end organ involvement to lupus.  She is referred by Dr. Corliss Reed.  She suggested a baseline echo to evaluate pulmonary pressures.  The patient is being managed for lupus.  I saw her in 2014 for chest pain.  She had had a cath by another cardiology group.  This demonstrated normal coronaries.    The patient does get chest pain occasionally.  Feels like something is inside of her chest.  She is able to do some activities without bringing on any discomfort.  She can walk for exercise.  She might get some DOE.  She did see Dr. Delton Reed recently and is going to have a CT scan of the chest.  She does have a mild cough at times that seems to help her chest discomfort.  She is not describing radiating chest or arm pain although she has lots of aches and pains related to to her lupus and fibromyalgia.  She works as a first Social research officer, government.    Past Medical History:  Diagnosis Date  . Anemia   . Asthma   . GERD (gastroesophageal reflux disease)   . History of stomach ulcers   . Lupus   . Rheumatoid arthritis (HCC)   . Thyroid mass   . Vertigo     Past Surgical History:  Procedure Laterality Date  . ABDOMINAL HYSTERECTOMY     done for menorrhagia, ovaries remain  . CERVICAL DISCECTOMY     plates and screws in neck  . CESAREAN SECTION    . COLONOSCOPY    . ESOPHAGEAL MANOMETRY N/A 02/12/2014   Procedure: ESOPHAGEAL MANOMETRY (EM);  Surgeon: Patricia Meckel, MD;  Location: [facility];  Service: Endoscopy;  Laterality: N/A;  . ESOPHAGOGASTRODUODENOSCOPY  03/04/2011   Procedure:  ESOPHAGOGASTRODUODENOSCOPY (EGD);  Surgeon: Patricia Meckel, MD;  Location: [facility];  Service: Endoscopy;  Laterality: N/A;  BOTOX Injection  . exc benign breast lump    . TONSILLECTOMY    . UPPER GASTROINTESTINAL ENDOSCOPY       Current Outpatient Medications  Medication Sig Dispense Refill  . acetaminophen (TYLENOL) 650 MG CR tablet Take 650 mg by mouth every 8 (eight) hours as needed for pain.    Marland Kitchen albuterol (PROAIR HFA) 108 (90 Base) MCG/ACT inhaler Inhale 2 puffs into the lungs every 4 (four) hours as needed for wheezing or shortness of breath. 1 Inhaler 5  . albuterol (PROVENTIL HFA;VENTOLIN HFA) 108 (90 BASE) MCG/ACT inhaler Inhale 2 puffs into the lungs every 6 (six) hours as needed for wheezing or shortness of breath.    . diclofenac sodium (VOLTAREN) 1 % GEL Apply 3 Reed topically as needed.    . fluticasone (FLONASE) 50 MCG/ACT nasal spray Place 2 sprays into both nostrils daily. 16 Reed 6  . folic acid (FOLVITE) 1 MG tablet Take 1 tablet (1 mg total) by mouth 2 (two) times daily. 180 tablet 4  . hydroxychloroquine (PLAQUENIL) 200 MG tablet Take 200mg  by mouth BID, Monday- Friday only. 40 tablet 0  . methotrexate 50 MG/2ML injection  Inject 0.8 mLs (20 mg total) into the skin once a week. 10 mL 0  . pantoprazole (PROTONIX) 40 MG tablet Take 1 tablet (40 mg total) by mouth daily. 90 tablet 1  . predniSONE (DELTASONE) 5 MG tablet Take 4 tabs po qd x1 week, 3 tabs po qd x1 week, 2 tabs po qd x1 week, 1 tab po qd x1 week, 1/2 tab po qd x1 week. 74 tablet 0  . Tuberculin-Allergy Syringes (ALLERGY SYRINGE 1CC/27GX1/2") 27G X 1/2" 1 ML MISC Patient to use to inject MTX weekly 12 each 3  . valACYclovir (VALTREX) 500 MG tablet Take 1 tablet (500 mg total) by mouth 2 (two) times daily. For 3 days with acute episodes. 18 tablet 1   No current facility-administered medications for this visit.     Allergies:   Aspirin; Hydrocodone-acetaminophen; Ibuprofen; Imdur [isosorbide nitrate]; Meperidine  hcl; Morphine; Oxycodone-acetaminophen; Procaine hcl; Toprol xl [metoprolol]; and Tramadol    Social History:  The patient  reports that she has never smoked. She has never used smokeless tobacco. She reports that she does not drink alcohol or use drugs.   Family History:  The patient's family history includes Alcohol abuse in her father; Colon cancer in her maternal uncle; Diabetes in her maternal grandmother; Heart disease in her father; Hypertension in her father.    ROS:  Please see the history of present illness.   Otherwise, review of systems are positive for none.   All other systems are reviewed and negative.    PHYSICAL EXAM: VS:  BP 134/88 (BP Location: Left Arm)   Pulse 80   Ht 5\' 5"  (1.651 m)   Wt 209 lb 9.6 oz (95.1 kg)   BMI 34.88 kg/m  , BMI Body mass index is 34.88 kg/m. GENERAL:  Well appearing HEENT:  Pupils equal round and reactive, fundi not visualized, oral mucosa unremarkable NECK:  No jugular venous distention, waveform within normal limits, carotid upstroke brisk and symmetric, no bruits, no thyromegaly LYMPHATICS:  No cervical, inguinal adenopathy LUNGS:  Clear to auscultation bilaterally BACK:  No CVA tenderness CHEST:  Unremarkable HEART:  PMI not displaced or sustained,S1 and S2 within normal limits, no S3, no S4, no clicks, no rubs, no murmurs ABD:  Flat, positive bowel sounds normal in frequency in pitch, no bruits, no rebound, no guarding, no midline pulsatile mass, no hepatomegaly, no splenomegaly EXT:  2 plus pulses throughout, no edema, no cyanosis no clubbing SKIN:  No rashes no nodules NEURO:  Cranial nerves II through XII grossly intact, motor grossly intact throughout PSYCH:  Cognitively intact, oriented to person place and time    EKG:  EKG is ordered today. The ekg ordered today demonstrates normal sinus rhythm, rate 80, axis within normal limits, intervals within normal limits, no acute ST-T wave changes.   Recent Labs: 02/26/2017: TSH  0.22 03/30/2017: ALT 13; BUN 11; Creat 0.63; Hemoglobin 13.3; Platelets 256; Potassium 3.8; Sodium 139    Lipid Panel No results found for: CHOL, TRIG, HDL, CHOLHDL, VLDL, LDLCALC, LDLDIRECT    Wt Readings from Last 3 Encounters:  04/15/17 209 lb 9.6 oz (95.1 kg)  04/13/17 210 lb 6 oz (95.4 kg)  03/30/17 208 lb (94.3 kg)      Other studies Reviewed: Additional studies/ records that were reviewed today include: Rheumatology and Pulmonary clinic notes. Review of the above records demonstrates:  Please see elsewhere in the note.     ASSESSMENT AND PLAN:  CHEST PAIN: Her pain is quite atypical.  I did go back to 2013 and saw her cath report where she had normal coronaries.  At this point I am going to wait for the chest CT to see if there is any evidence of coronary calcium.  I think the pretest probability of obstructive coronary disease is low.  DYSPNEA: Reports this.  I will check an echocardiogram to evaluate for possible pulmonary hypertension or lupus.   Current medicines are reviewed at length with the patient today.  The patient does not have concerns regarding medicines.  The following changes have been made:  no change  Labs/ tests ordered today include:   Orders Placed This Encounter  Procedures  . EKG 12-Lead  . ECHOCARDIOGRAM COMPLETE     Disposition:   FU with me in 12 months.      Signed, Patricia Rotunda, MD  04/15/2017 12:58 PM    [facility]

## 2017-04-15 ENCOUNTER — Encounter: Payer: Self-pay | Admitting: Cardiology

## 2017-04-15 ENCOUNTER — Ambulatory Visit: Payer: BC Managed Care – PPO | Admitting: Cardiology

## 2017-04-15 VITALS — BP 134/88 | HR 80 | Ht 65.0 in | Wt 209.6 lb

## 2017-04-15 DIAGNOSIS — R079 Chest pain, unspecified: Secondary | ICD-10-CM

## 2017-04-15 DIAGNOSIS — R0602 Shortness of breath: Secondary | ICD-10-CM

## 2017-04-15 NOTE — Patient Instructions (Signed)

## 2017-04-17 ENCOUNTER — Emergency Department (HOSPITAL_COMMUNITY): Payer: BC Managed Care – PPO

## 2017-04-17 ENCOUNTER — Encounter (HOSPITAL_COMMUNITY): Payer: Self-pay | Admitting: Emergency Medicine

## 2017-04-17 ENCOUNTER — Emergency Department (HOSPITAL_COMMUNITY)
Admission: EM | Admit: 2017-04-17 | Discharge: 2017-04-17 | Disposition: A | Discharge status: Home / Self Care | Payer: BC Managed Care – PPO | Attending: Emergency Medicine | Admitting: Emergency Medicine

## 2017-04-17 DIAGNOSIS — Z79899 Other long term (current) drug therapy: Secondary | ICD-10-CM | POA: Insufficient documentation

## 2017-04-17 DIAGNOSIS — J45909 Unspecified asthma, uncomplicated: Secondary | ICD-10-CM | POA: Diagnosis not present

## 2017-04-17 DIAGNOSIS — R079 Chest pain, unspecified: Secondary | ICD-10-CM | POA: Diagnosis present

## 2017-04-17 DIAGNOSIS — I10 Essential (primary) hypertension: Secondary | ICD-10-CM | POA: Insufficient documentation

## 2017-04-17 DIAGNOSIS — R0602 Shortness of breath: Secondary | ICD-10-CM | POA: Diagnosis not present

## 2017-04-17 HISTORY — DX: Old myocardial infarction: I25.2

## 2017-04-17 LAB — I-STAT TROPONIN, ED
TROPONIN I, POC: 0 ng/mL (ref 0.00–0.08)
Troponin i, poc: 0.01 ng/mL (ref 0.00–0.08)

## 2017-04-17 LAB — BASIC METABOLIC PANEL
ANION GAP: 10 (ref 5–15)
BUN: 15 mg/dL (ref 6–20)
CALCIUM: 8.9 mg/dL (ref 8.9–10.3)
CO2: 27 mmol/L (ref 22–32)
Chloride: 104 mmol/L (ref 101–111)
Creatinine, Ser: 0.67 mg/dL (ref 0.44–1.00)
GFR calc non Af Amer: >60 mL/min (ref >60)
Glucose, Bld: 100 mg/dL — ABNORMAL HIGH (ref 65–99)
Potassium: 3.4 mmol/L — ABNORMAL LOW (ref 3.5–5.1)
SODIUM: 141 mmol/L (ref 135–145)

## 2017-04-17 LAB — D-DIMER, QUANTITATIVE (NOT AT ARMC): D DIMER QUANT: 0.39 ug{FEU}/mL (ref 0.00–0.50)

## 2017-04-17 LAB — CBC
HCT: 40.8 % (ref 36.0–46.0)
HEMOGLOBIN: 12.8 g/dL (ref 12.0–15.0)
MCH: 28.4 pg (ref 26.0–34.0)
MCHC: 31.4 g/dL (ref 30.0–36.0)
MCV: 90.7 fL (ref 78.0–100.0)
Platelets: 286 10*3/uL (ref 150–400)
RBC: 4.5 MIL/uL (ref 3.87–5.11)
RDW: 14.7 % (ref 11.5–15.5)
WBC: 4.8 10*3/uL (ref 4.0–10.5)

## 2017-04-17 LAB — I-STAT BETA HCG BLOOD, ED (MC, WL, AP ONLY): I-stat hCG, quantitative: <5 m[IU]/mL (ref <5)

## 2017-04-17 NOTE — Discharge Instructions (Addendum)
Blood work, EKG and chest xray were reassuring  Keep taking your prednisone as prescribed. Take tylenol for pain as you have been doing. You can also apply heat to the chest wall to help with your symptoms.  Return to the ER if you have any new or concerning symptoms, have chest pain with worsening trouble breathing, chest pain radiating down your left arm, chest pain with vomiting, chest pain with breaking out into sweat.

## 2017-04-17 NOTE — ED Triage Notes (Signed)
Pt reports that she had intermittent chest pain since Tuesday that will come in center and radiate to lateral side. Reports PCP sent her to [facility] for further evaluation. Hx MI before.

## 2017-04-17 NOTE — ED Provider Notes (Signed)
[facility]-EMERGENCY DEPT Provider Note   CSN: [id number] Arrival date & time: 04/17/17  1349     History   Chief Complaint Chief Complaint  Patient presents with  . Chest Pain    HPI Patricia Reed is a 57 y.o. female.  HPI  Patricia Reed is a 57yo female with a history of Lupus, GERD who presents to the emergency department for evaluation of left sided chest pain. Patient states that her pain began four days ago and has been getting progressively worse. She reports her pain is intermittent. Seems to be triggered by laying on the right side, laying flat, taking a deep breath or walking. She reports that when pain comes it is 10/10 in severity and feels sharp and shooting. Pain radiates to her back at times. She reports that she feels short of breath and has lightheadedness with pain. She has been taking tylenol without significant improvement. She denies smoking history. Denies history of exertional chest pain. She reports that she had a lupus flare with joint pain last week and her PCP added prednisone to her Methotrexate and Plaquenil regimen. Patient denies fever, chills, cough, congestion, sore throat, abdominal pain, nausea/vomiting, diarrhea, dysuria, syncope. She denies history of DVT/PE, leg swelling/calf pain, exogenous estrogen, recent surgery or immobilization.   Per chart review, patient had a cath 2013 with normal coronary arteries.   Past Medical History:  Diagnosis Date  . Anemia   . Asthma   . GERD (gastroesophageal reflux disease)   . History of stomach ulcers   . Lupus   . MI, old   . Rheumatoid arthritis (HCC)   . Thyroid mass   . Vertigo     Patient Active Problem List   Diagnosis Date Noted  . Shortness of breath 04/15/2017  . Dizziness 02/04/2017  . Situational anxiety 06/08/2016  . Atypical chest pain 06/08/2016  . Trapezius muscle spasm 04/23/2016  . Fibromyalgia 02/13/2016  . 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 Chronic fatigue  02/13/2016  . Vitamin D deficiency 11/25/2015  . Autoimmune disease (HCC) 11/23/2015  . High risk medication use 11/23/2015  . Colon cancer screening 01/25/2014  . Anterior neck pain 12/01/2013  . Inflammatory arthritis 08/18/2012  . Allergic rhinitis 06/23/2012  . HTN (hypertension) 06/23/2012  . High cholesterol 06/23/2012  . Thyroid nodule - benign 06/23/2012  . Arthralgia 06/23/2012  . Mild intermittent asthma   . Sickle cell trait (HCC)   . History of stomach ulcers   . ESOPHAGEAL STRICTURE 10/04/2007  . GERD 10/04/2007  . 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 Dysphagia, pharyngoesophageal phase 10/04/2007    Past Surgical History:  Procedure Laterality Date  . ABDOMINAL HYSTERECTOMY     done for menorrhagia, ovaries remain  . CERVICAL DISCECTOMY     plates and screws in neck  . CESAREAN SECTION    . COLONOSCOPY    . ESOPHAGEAL MANOMETRY N/A 02/12/2014   Procedure: ESOPHAGEAL MANOMETRY (EM);  Surgeon: Louis Meckel, MD;  Location: [facility] END[facility]COPY;  Service: Endoscopy;  Laterality: N/A;  . ESOPHAGOGASTRODUODENOSCOPY  03/04/2011   Procedure: ESOPHAGOGASTRODUODENOSCOPY (EGD);  Surgeon: Louis Meckel, MD;  Location: [facility];  Service: Endoscopy;  Laterality: N/A;  BOTOX Injection  . exc benign breast lump    . TONSILLECTOMY    . UPPER GASTROINTESTINAL ENDOSCOPY       OB History    Gravida  5   Para  3   Term  3   Preterm  AB      Living        SAB      TAB      Ectopic      Multiple      Live Births               Home Medications    Prior to Admission medications   Medication Sig Start Date End Date Taking? Authorizing Provider  acetaminophen (TYLENOL) 650 MG CR tablet Take 650 mg by mouth every 8 (eight) hours as needed for pain.    [provider]  albuterol (PROAIR HFA) 108 (90 Base) MCG/ACT inhaler Inhale 2 puffs into the lungs every 4 (four) hours as needed for wheezing or shortness of breath. 03/25/17   Leslye Peer, MD    albuterol (PROVENTIL HFA;VENTOLIN HFA) 108 (90 BASE) MCG/ACT inhaler Inhale 2 puffs into the lungs every 6 (six) hours as needed for wheezing or shortness of breath.    [provider]  diclofenac sodium (VOLTAREN) 1 % GEL Apply 3 g topically as needed.    [provider]  fluticasone (FLONASE) 50 MCG/ACT nasal spray Place 2 sprays into both nostrils daily. 02/22/17   Nafziger, Kandee Keen, NP  folic acid (FOLVITE) 1 MG tablet Take 1 tablet (1 mg total) by mouth 2 (two) times daily. 04/10/16 07/04/17  Tawni Pummel, PA-C  hydroxychloroquine (PLAQUENIL) 200 MG tablet Take 200mg  by mouth BID, Monday- Friday only. 03/30/17   Pollyann Savoy, MD  methotrexate 50 MG/2ML injection Inject 0.8 mLs (20 mg total) into the skin once a week. 08/28/16   Panwala, Naitik, PA-C  pantoprazole (PROTONIX) 40 MG tablet Take 1 tablet (40 mg total) by mouth daily. 04/13/17   [place], [name], MD  predniSONE (DELTASONE) 5 MG tablet Take 4 tabs po qd x1 week, 3 tabs po qd x1 week, 2 tabs po qd x1 week, 1 tab po qd x1 week, 1/2 tab po qd x1 week. 03/30/17   Pollyann Savoy, MD  Tuberculin-Allergy Syringes (ALLERGY SYRINGE 1CC/27GX1/2") 27G X 1/2" 1 ML MISC Patient to use to inject MTX weekly 08/21/16   Pollyann Savoy, MD  valACYclovir (VALTREX) 500 MG tablet Take 1 tablet (500 mg total) by mouth 2 (two) times daily. For 3 days with acute episodes. 04/13/17   [place], [name], MD    Family History Family History  Problem Relation Age of Onset  . Alcohol abuse Father   . Hypertension Father   . Heart disease Father   . Colon cancer Maternal Uncle   . Diabetes Maternal Grandmother   . Colon polyps Neg Hx   . Esophageal cancer Neg Hx   . Kidney disease Neg Hx   . Stomach cancer Neg Hx   . Rectal cancer Neg Hx     Social History Social History   Tobacco Use  . Smoking status: Never Smoker  . Smokeless tobacco: Never Used  Substance Use Topics  . Alcohol use: No    Alcohol/week: 0.0 oz  . Drug use: No      Allergies   Aspirin; Hydrocodone-acetaminophen; Ibuprofen; Imdur [isosorbide nitrate]; Meperidine hcl; Morphine; Oxycodone-acetaminophen; Procaine hcl; Toprol xl [metoprolol]; and Tramadol   Review of Systems Review of Systems  Constitutional: Negative for chills and fever.  Eyes: Negative for visual disturbance.  Respiratory: Positive for shortness of breath. Negative for cough and wheezing.   Cardiovascular: Positive for chest pain. Negative for leg swelling.  Gastrointestinal: Negative for abdominal pain, nausea and vomiting.  Genitourinary: Negative  for difficulty urinating.  Musculoskeletal: Negative for arthralgias and gait problem.  Skin: Negative for rash.  Neurological: Positive for light-headedness. Negative for weakness, numbness and headaches.  Psychiatric/Behavioral: Negative for agitation.     Physical Exam Updated Vital Signs BP 140/89 (BP Location: Right Arm)   Pulse 89   Temp 98.7 F (37.1 C) (Oral)   Resp 17   Ht 5\' 5"  (1.651 m)   Wt 96.2 kg (212 lb)   SpO2 100%   BMI 35.28 kg/m   Physical Exam  Constitutional: She is oriented to person, place, and time. She appears well-developed and well-nourished. No distress.  HENT:  Head: Normocephalic and atraumatic.  Mouth/Throat: Oropharynx is clear and moist. No oropharyngeal exudate.  Eyes: Pupils are equal, round, and reactive to light. Conjunctivae are normal. Right eye exhibits no discharge. Left eye exhibits no discharge.  Neck: Normal range of motion. No JVD present. No tracheal deviation present.  Cardiovascular: Normal rate, regular rhythm and intact distal pulses. Exam reveals no friction rub.  No murmur heard. Pulmonary/Chest: Effort normal and breath sounds normal. No respiratory distress.  Tender to palpation over the anterior chest wall  Abdominal: Soft. Bowel sounds are normal. There is no tenderness.  Musculoskeletal: Normal range of motion.  No leg swelling or calf tenderness.   Neurological: She is alert and oriented to person, place, and time. Coordination normal.  Skin: Skin is warm and dry. Capillary refill takes less than 2 seconds. She is not diaphoretic.  Psychiatric: She has a normal mood and affect. Her behavior is normal.  Nursing note and vitals reviewed.    ED Treatments / Results  Labs (all labs ordered are listed, but only abnormal results are displayed) Labs Reviewed  BASIC METABOLIC PANEL - Abnormal; Notable for the following components:      Result Value   Potassium 3.4 (*)    Glucose, Bld 100 (*)    All other components within normal limits  CBC  D-DIMER, QUANTITATIVE (NOT AT [facility])  I-STAT TROPONIN, ED  I-STAT BETA HCG BLOOD, ED ([facility], [facility], [facility] ONLY)  I-STAT TROPONIN, ED    EKG EKG Interpretation  Date/Time:  Saturday April 17 2017 13:58:53 EDT Ventricular Rate:  82 PR Interval:    QRS Duration: 104 QT Interval:  392 QTC Calculation: 458 R Axis:   40 Text Interpretation:  Sinus rhythm Baseline wander in lead(s) I II aVR V5 No significant change since last tracing Confirmed by 04-24-1993 [phone]) on 04/17/2017 5:46:30 PM   Radiology Dg Chest 2 View  Result Date: 04/17/2017 CLINICAL DATA:  Chest pain. EXAM: CHEST - 2 VIEW COMPARISON:  Radiographs of July 01, 2016. FINDINGS: The heart size and mediastinal contours are within normal limits. Both lungs are clear. No pneumothorax or pleural effusion is noted. The visualized skeletal structures are unremarkable. IMPRESSION: No active cardiopulmonary disease. Electronically Signed   By: July 03, 2016, M.D.   On: 04/17/2017 14:56    Procedures Procedures (including critical care time)  Medications Ordered in ED Medications - No data to display   Initial Impression / Assessment and Plan / ED Course  I have reviewed the triage vital signs and the nursing notes.  Pertinent labs & imaging results that were available during my care of the patient were reviewed by me and considered in  my medical decision making (see chart for details).    Patient is to be discharged with recommendation to follow up with her cardiologist Dr. 04/19/2017 in regards to  today's hospital visit. Chest pain is not likely of cardiac or pulmonary etiology d/t presentation, VSS, no tracheal deviation, no JVD or new murmur, RRR, breath sounds equal bilaterally, EKG without acute abnormalities, negative delta troponin, and negative CXR. Did get ddimer given patient has hx of lupus and reports pleuritic pain. Ddimer negative, patient's O2 sat remained >95% while she was in the ED, no tachycardia or tachypnea. Therefore do not suspect PE. Pt has been advised to return to the ED if CP becomes exertional, associated with diaphoresis or nausea, radiates to left jaw/arm, worsens or becomes concerning in any way. Patient has been counseled to take tylenol for pain and continue her prednisone as prescribed by her PCP. Pt appears reliable for follow up and is agreeable to discharge.  Case has been discussed with Dr. Silverio Lay who agrees with the above plan to discharge.   Final Clinical Impressions(s) / ED Diagnoses   Final diagnoses:  Chest pain, unspecified type    ED Discharge Orders    None       Kellie Shropshire, PA-C 04/18/17 1007    Charlynne Pander, MD 04/18/17 [phone]

## 2017-04-22 ENCOUNTER — Other Ambulatory Visit: Payer: Self-pay

## 2017-04-22 ENCOUNTER — Ambulatory Visit (HOSPITAL_COMMUNITY): Payer: BC Managed Care – PPO | Attending: Cardiology

## 2017-04-22 DIAGNOSIS — I10 Essential (primary) hypertension: Secondary | ICD-10-CM | POA: Diagnosis not present

## 2017-04-22 DIAGNOSIS — E785 Hyperlipidemia, unspecified: Secondary | ICD-10-CM | POA: Insufficient documentation

## 2017-04-22 DIAGNOSIS — R0602 Shortness of breath: Secondary | ICD-10-CM | POA: Diagnosis not present

## 2017-04-23 ENCOUNTER — Telehealth: Payer: Self-pay | Admitting: Rheumatology

## 2017-04-23 MED ORDER — HYDROXYCHLOROQUINE SULFATE 200 MG PO TABS: ORAL_TABLET | ORAL | 0 refills | Status: DC

## 2017-04-23 NOTE — Telephone Encounter (Signed)
Last visit: 03/30/2017 Next visit: 04/28/2017 Labs: 03/30/2017  Eye exam: 04/17/2017, patient is scheduled for a visual field on 04/26/2017 per [facility].   Okay to refill 30 day supply of PLQ?

## 2017-04-23 NOTE — Telephone Encounter (Signed)
Patient requested prescription refill of Plaquenil.  Patient's pharmacy is [facility] on  [address].

## 2017-04-23 NOTE — Telephone Encounter (Signed)
Advise okay to give 30-day supply of Plaquenil

## 2017-04-28 ENCOUNTER — Encounter: Payer: Self-pay | Admitting: Emergency Medicine

## 2017-04-28 ENCOUNTER — Ambulatory Visit: Payer: BC Managed Care – PPO | Admitting: Emergency Medicine

## 2017-04-28 ENCOUNTER — Encounter: Payer: Self-pay | Admitting: *Deleted

## 2017-04-28 ENCOUNTER — Ambulatory Visit: Payer: Self-pay | Admitting: Physician Assistant

## 2017-04-28 ENCOUNTER — Ambulatory Visit (INDEPENDENT_AMBULATORY_CARE_PROVIDER_SITE_OTHER): Payer: BC Managed Care – PPO | Admitting: Emergency Medicine

## 2017-04-28 DIAGNOSIS — M1990 Unspecified osteoarthritis, unspecified site: Secondary | ICD-10-CM

## 2017-04-28 DIAGNOSIS — J4520 Mild intermittent asthma, uncomplicated: Secondary | ICD-10-CM | POA: Diagnosis not present

## 2017-04-28 LAB — PULMONARY FUNCTION TEST
DL/VA % pred: 88 %
DL/VA: 4.38 ml/min/mmHg/L
DLCO cor % pred: 69 %
DLCO cor: 17.88 ml/min/mmHg
DLCO unc % pred: 68 %
DLCO unc: 17.54 ml/min/mmHg
FEF 25-75 Post: 4.43 L/sec
FEF 25-75 Pre: 3.52 L/sec
FEF2575-%Change-Post: 25 %
FEF2575-%Pred-Post: 190 %
FEF2575-%Pred-Pre: 151 %
FEV1-%Change-Post: 6 %
FEV1-%Pred-Post: 115 %
FEV1-%Pred-Pre: 109 %
FEV1-Post: 2.66 L
FEV1-Pre: 2.51 L
FEV1FVC-%Change-Post: 3 %
FEV1FVC-%Pred-Pre: 108 %
FEV6-%Change-Post: 2 %
FEV6-%Pred-Post: 104 %
FEV6-%Pred-Pre: 102 %
FEV6-Post: 2.95 L
FEV6-Pre: 2.89 L
FEV6FVC-%Pred-Post: 103 %
FEV6FVC-%Pred-Pre: 103 %
FVC-%Change-Post: 2 %
FVC-%Pred-Post: 101 %
FVC-%Pred-Pre: 99 %
FVC-Post: 2.95 L
FVC-Pre: 2.89 L
Post FEV1/FVC ratio: 90 %
Post FEV6/FVC ratio: 100 %
Pre FEV1/FVC ratio: 87 %
Pre FEV6/FVC Ratio: 100 %
RV % pred: 57 %
RV: 1.12 L
TLC % pred: 78 %
TLC: 4.08 L

## 2017-04-28 NOTE — Assessment & Plan Note (Signed)
Clinical history of asthma.  Her pulmonary function testing today is reassuring without any evidence of obstruction.  She does have a change in her FEF 25-75% of 25%.  I believe would be adequate for her to continue to keep albuterol available to use as needed.  She does not need a maintenance bronchodilator or an ICS at this time.

## 2017-04-28 NOTE — Patient Instructions (Signed)
Your pulmonary function testing today does not show any evidence for active asthma at this time. Please keep your albuterol available to use 2 puffs if needed for shortness of breath, wheezing, chest tightness. We will need to follow-up annually with a chest x-ray to ensure that your lung exam and your chest x-ray is stable.  If your breathing changes in any way then we would see you sooner and consider either chest x-ray or CT scan of your chest at that time.

## 2017-04-28 NOTE — Progress Notes (Signed)
PFT done today. 

## 2017-04-28 NOTE — Assessment & Plan Note (Signed)
Given her autoimmune disease and her methotrexate use she needs an annual chest x-ray.  If her Chest x-ray, dyspnea or chest symptoms change in any way then we would do a CT scan of the chest at that time.

## 2017-04-28 NOTE — Progress Notes (Signed)
Subjective:    Patient ID: Patricia Reed, female    DOB: July 06, 1960, 57 y.o.   MRN: [id number]  Asthma  There is no cough, shortness of breath or wheezing. Associated symptoms include chest pain, a sore throat and trouble swallowing. Pertinent negatives include no ear pain, fever, headaches, postnasal drip, rhinorrhea or sneezing. Her past medical history is significant for asthma.   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  She is on immunosuppressive medication, methotrexate and Plaquenil. Carries a dx of asthma that was made over 20 yrs ago.   She notices some fleeting SOB that can happen at any time, causes her to sigh to get a good breath. No exertional SOB. Can hear wheeze when she has a URI, when the weather changes.  She uses flonase as needed. Very rarely uses albuterol either by HFA or nebs.  Frequent sharp L anterior upper chest pain, she describes a feeling consistent w musculoskeletal. She is planning to see Dr Antoine Poche next week to re-establish care.   Chest x-ray 07/01/16 reviewed, shows no evidence of infiltrate or abnormality.  ROV 04/28/17 --follow-up visit for never smoker with a history of lupus, mixed connective tissue disease (versus RA), GERD, hypertension.  She is on methotrexate and Plaquenil.  She carries a long-standing diagnosis of asthma.  She underwent pulmonary function testing today that I reviewed.  This shows normal airflows without a significant bronchodilator response, mild restriction on her lung volumes, slightly decreased diffusion capacity that corrects to the normal range when corrected for her alveolar volume. She reports minimal albuterol use, but does still use when acutely ill.    Review of Systems  Constitutional: Positive for unexpected weight change. Negative for fever.  HENT: Positive for sore throat  and trouble swallowing. Negative for congestion, dental problem, ear pain, nosebleeds, postnasal drip, rhinorrhea, sinus pressure and sneezing.   Eyes: Negative for redness and itching.  Respiratory: Negative for cough, chest tightness, shortness of breath and wheezing.   Cardiovascular: Positive for chest pain. Negative for palpitations and leg swelling.  Gastrointestinal: Negative for nausea and vomiting.  Genitourinary: Negative for dysuria.  Musculoskeletal: Negative for joint swelling.  Skin: Negative for rash.  Neurological: Negative for headaches.  Hematological: Does not bruise/bleed easily.  Psychiatric/Behavioral: Negative for dysphoric mood. The patient is not nervous/anxious.    Past Medical History:  Diagnosis Date  . Anemia   . Asthma   . GERD (gastroesophageal reflux disease)   . History of stomach ulcers   . Lupus (HCC)   . MI, old   . Rheumatoid arthritis (HCC)   . Thyroid mass   . Vertigo      Family History  Problem Relation Age of Onset  . Alcohol abuse Father   . Hypertension Father   . Heart disease Father   . Colon cancer Maternal Uncle   . Diabetes Maternal Grandmother   . Colon polyps Neg Hx   . Esophageal cancer Neg Hx   . Kidney disease Neg Hx   . Stomach cancer Neg Hx   . Rectal cancer Neg Hx      Social History   Socioeconomic History  . Marital status: Married    Spouse name: Not on file  . Number of children: 4  . Years of education: Not on file  . Highest education level: Not on file  Occupational History  .  Occupation: Lobbyist: [employer]  Social Needs  . Financial resource strain: Not on file  . Food insecurity:    Worry: Not on file    Inability: Not on file  . Transportation needs:    Medical: Not on file    Non-medical: Not on file  Tobacco Use  . Smoking status: Never Smoker  . Smokeless tobacco: Never Used  Substance and Sexual Activity  . Alcohol use: No    Alcohol/week: 0.0 oz    . Drug use: No  . Sexual activity: Yes    Birth control/protection: None, Surgical    Comment: LAVH  Lifestyle  . Physical activity:    Days per week: Not on file    Minutes per session: Not on file  . Stress: Not on file  Relationships  . Social connections:    Talks on phone: Not on file    Gets together: Not on file    Attends religious service: Not on file    Active member of club or organization: Not on file    Attends meetings of clubs or organizations: Not on file    Relationship status: Not on file  . Intimate partner violence:    Fear of current or ex partner: Not on file    Emotionally abused: Not on file    Physically abused: Not on file    Forced sexual activity: Not on file  Other Topics Concern  . 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       Allergies  Allergen Reactions  . Aspirin Other (See Comments)    GI bleed  . Hydrocodone-Acetaminophen Hives and Nausea And Vomiting  . Ibuprofen Other (See Comments)    GI bleed  . Imdur [Isosorbide Nitrate] Other (See Comments)    Reaction unknown  . Meperidine Hcl Hives and Nausea And Vomiting    Short term memory loss  . Morphine Hives and Nausea And Vomiting  . Oxycodone-Acetaminophen Hives and Nausea And Vomiting    Reaction unknown  . Procaine Hcl Other (See Comments)    Ineffective  . Toprol Xl [Metoprolol] Other (See Comments)    Reaction unknown  . Tramadol Itching     Outpatient Medications Prior to Visit  Medication Sig Dispense Refill  . acetaminophen (TYLENOL) 650 MG CR tablet Take 650 mg by mouth every 8 (eight) hours as needed for pain.    Marland Kitchen albuterol (PROAIR HFA) 108 (90 Base) MCG/ACT inhaler Inhale 2 puffs into the lungs every 4 (four) hours as needed for wheezing or shortness of breath. 1 Inhaler 5  . diclofenac sodium (VOLTAREN) 1 % GEL Apply 3 g topically as needed.    . fluticasone (FLONASE) 50 MCG/ACT nasal spray  Place 2 sprays into both nostrils daily. 16 g 6  . folic acid (FOLVITE) 1 MG tablet Take 1 tablet (1 mg total) by mouth 2 (two) times daily. 180 tablet 4  . hydroxychloroquine (PLAQUENIL) 200 MG tablet Take 200mg  by mouth BID, Monday- Friday only. 40 tablet 0  . methotrexate 50 MG/2ML injection Inject 0.8 mLs (20 mg total) into the skin once a week. (Patient taking differently: Inject 20 mg into the skin once a week. Sunday) 10 mL 0  . pantoprazole (PROTONIX) 40 MG tablet Take 1 tablet (40 mg total) by mouth daily. 90 tablet 1  . Tuberculin-Allergy Syringes (ALLERGY SYRINGE 1CC/27GX1/2") 27G X 1/2"  1 ML MISC Patient to use to inject MTX weekly 12 each 3  . valACYclovir (VALTREX) 500 MG tablet Take 1 tablet (500 mg total) by mouth 2 (two) times daily. For 3 days with acute episodes. 18 tablet 1  . vitamin B-12 (CYANOCOBALAMIN) 100 MCG tablet Take 100 mcg by mouth daily.    Marland Kitchen VITAMIN D, ERGOCALCIFEROL, PO Take 1 capsule by mouth daily.    . predniSONE (DELTASONE) 5 MG tablet Take 4 tabs po qd x1 week, 3 tabs po qd x1 week, 2 tabs po qd x1 week, 1 tab po qd x1 week, 1/2 tab po qd x1 week. 74 tablet 0   No facility-administered medications prior to visit.         Objective:   Physical Exam Vitals:   04/28/17 1609 04/28/17 1613  BP:  122/80  Pulse:  91  SpO2:  100%  Weight: 211 lb (95.7 kg)   Height: 5\' 5"  (1.651 m)    Gen: Pleasant, well-nourished, in no distress,  normal affect  ENT: No lesions,  mouth clear,  oropharynx clear, no postnasal drip  Neck: No JVD, no stridor  Lungs: No use of accessory muscles, clear without rales or rhonchi  Cardiovascular: RRR, heart sounds normal, no murmur or gallops, no peripheral edema  Musculoskeletal: No deformities, no cyanosis or clubbing  Neuro: alert, non focal  Skin: Warm     Assessment & Plan:  Mild intermittent asthma Clinical history of asthma.  Her pulmonary function testing today is reassuring without any evidence of  obstruction.  She does have a change in her FEF 25-75% of 25%.  I believe would be adequate for her to continue to keep albuterol available to use as needed.  She does not need a maintenance bronchodilator or an ICS at this time.  Inflammatory arthritis Given her autoimmune disease and her methotrexate use she needs an annual chest x-ray.  If her Chest x-ray, dyspnea or chest symptoms change in any way then we would do a CT scan of the chest at that time.    , MD, PhD 04/28/2017, 4:37 PM [facility] [phone] or if no answer [phone]

## 2017-04-29 ENCOUNTER — Encounter: Payer: Self-pay | Admitting: *Deleted

## 2017-04-29 NOTE — Progress Notes (Signed)
Office Visit Note  [name]             Date of Birth: [birth year]           MRN: [id number]             PCP: [place], [name], MD Referring: [place], [name], MD Visit Date: 05/11/2017 Occupation: @GUAROCC @    Subjective:  Pain in multiple joints    History of Present Illness: Patricia Reed is a 57 y.o. female with history of mixed connective tissue disease.  Patient has been taking methotrexate 0.8 mL subcutaneously once a week, folic acid 2 mg daily and Plaquenil 200 mg twice daily.  She denies missing any doses.  She states that while she was on her prednisone taper she felt a lot better.  She states that after finishing the taper she started having pain in multiple joints and swelling in her bilateral hands bilateral wrists bilateral knees and bilateral ankles.  She states she is also having some discomfort in her bilateral shoulders.  She states she continues to have trochanteric bursitis bilaterally.  She states that her neck pain has been waking her up at night.  She states she continues to have chronic insomnia and fatigue.    Activities of Daily Living:  Patient reports morning stiffness for 3  hours.   Patient Reports nocturnal pain.  Difficulty dressing/grooming: Denies Difficulty climbing stairs: Reports Difficulty getting out of chair: Reports Difficulty using hands for taps, buttons, cutlery, and/or writing: Reports   Review of Systems  Constitutional: Positive for fatigue.  HENT: Positive for mouth dryness and nose dryness. Negative for mouth sores, trouble swallowing and trouble swallowing.   Eyes: Negative for pain, visual disturbance and dryness.  Respiratory: Negative for cough, hemoptysis, shortness of breath and difficulty breathing.   Cardiovascular: Negative for chest pain, palpitations, hypertension and swelling in legs/feet.  Gastrointestinal: Positive for constipation. Negative for blood in stool and diarrhea.  Endocrine: Negative  for increased urination.  Genitourinary: Negative for painful urination.  Musculoskeletal: Positive for arthralgias, joint pain, joint swelling, myalgias, morning stiffness, muscle tenderness and myalgias. Negative for muscle weakness.  Skin: Positive for color change and rash. Negative for pallor, hair loss, nodules/bumps, skin tightness, ulcers and sensitivity to sunlight.  Allergic/Immunologic: Negative for susceptible to infections.  Neurological: Negative for dizziness, numbness, headaches and weakness.  Hematological: Negative for swollen glands.  Psychiatric/Behavioral: Positive for sleep disturbance. Negative for depressed mood. The patient is not nervous/anxious.     PMFS History:  Patient Active Problem List   Diagnosis Date Noted  . Shortness of breath 04/15/2017  . Dizziness 02/04/2017  . Situational anxiety 06/08/2016  . Atypical chest pain 06/08/2016  . Trapezius muscle spasm 04/23/2016  . Fibromyalgia 02/13/2016  . 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 Chronic fatigue 02/13/2016  . Vitamin D deficiency 11/25/2015  . Autoimmune disease (HCC) 11/23/2015  . High risk medication use 11/23/2015  . Colon cancer screening 01/25/2014  . Anterior neck pain 12/01/2013  . Inflammatory arthritis 08/18/2012  . Allergic rhinitis 06/23/2012  . HTN (hypertension) 06/23/2012  . High cholesterol 06/23/2012  . Thyroid nodule - benign 06/23/2012  . Arthralgia 06/23/2012  . Mild intermittent asthma   . Sickle cell trait (HCC)   . History of stomach ulcers   . ESOPHAGEAL STRICTURE 10/04/2007  . GERD 10/04/2007  . 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 Dysphagia, pharyngoesophageal phase 10/04/2007    Past Medical History:  Diagnosis Date  . Anemia   .  Asthma   . GERD (gastroesophageal reflux disease)   . History of stomach ulcers   . Lupus (HCC)   . MI, old   . Rheumatoid arthritis (HCC)   . Thyroid mass   . Vertigo     Family History  Problem Relation Age of Onset  . Alcohol abuse  Father   . Hypertension Father   . Heart disease Father   . Colon cancer Maternal Uncle   . Diabetes Maternal Grandmother   . Colon polyps Neg Hx   . Esophageal cancer Neg Hx   . Kidney disease Neg Hx   . Stomach cancer Neg Hx   . Rectal cancer Neg Hx    Past Surgical History:  Procedure Laterality Date  . ABDOMINAL HYSTERECTOMY     done for menorrhagia, ovaries remain  . CERVICAL DISCECTOMY     plates and screws in neck  . CESAREAN SECTION    . COLONOSCOPY    . ESOPHAGEAL MANOMETRY N/A 02/12/2014   Procedure: ESOPHAGEAL MANOMETRY (EM);  Surgeon: Louis Meckel, MD;  Location: [facility];  Service: Endoscopy;  Laterality: N/A;  . ESOPHAGOGASTRODUODENOSCOPY  03/04/2011   Procedure: ESOPHAGOGASTRODUODENOSCOPY (EGD);  Surgeon: Louis Meckel, MD;  Location: [facility];  Service: Endoscopy;  Laterality: N/A;  BOTOX Injection  . exc benign breast lump    . TONSILLECTOMY    . 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       Objective: Vital Signs: BP 122/78 (BP Location: Left Arm, Patient Position: Sitting, Cuff Size: Normal)   Pulse 76   Resp 15   Ht 5\' 5"  (1.651 m)   Wt 210 lb (95.3 kg)   BMI 34.95 kg/m    Physical Exam  Constitutional: She is oriented to person, place, and time. She appears well-developed and well-nourished.  HENT:  Head: Normocephalic and atraumatic.  No oral or nasal ulcerations noted.  Eyes: Conjunctivae and EOM are normal.  Neck: Normal range of motion.  Cardiovascular: Normal rate, regular rhythm, normal heart sounds and intact distal pulses.  Pulmonary/Chest: Effort normal and breath sounds normal.  Abdominal: Soft. Bowel sounds are normal.  Lymphadenopathy:    She has no cervical adenopathy.  Neurological: She is alert and oriented to person, place, and time.  Skin: Skin is warm and dry. Capillary refill takes less than 2  seconds.  No malar rash noted.  Psychiatric: She has a normal mood and affect. Her behavior is normal.  Nursing note and vitals reviewed.    Musculoskeletal Exam: C-spine limited range of motion with discomfort.  Limited range of motion of thoracic and lumbar spine.  No midline spinal tenderness.  No SI joint tenderness.  She has full shoulder range of motion with discomfort.  Elbow joints good range of motion.  She has limited range of motion of bilateral wrists.  She has tenderness and extensor tenosynovitis of bilateral wrist joints.  MCPs, PIPs, and DIPs good ROM.  She has tenderness and synovitis of all MCPs and PIPs.  Hip joints good ROM.  She has tenderness of bilateral trochanteric bursa.  Knee joints, ankle joints, MTPs, PIPs, and DIPs good ROM with no synovitis.  No warmth or effusion of knee joints. She has tenderness of PIP and DIP joints.  She has PIP and DIP synovial thickening consistent with osteoarthritis of bilateral feet.   CDAI Exam: CDAI Homunculus Exam:  Tenderness:  RUE: glenohumeral and wrist LUE: glenohumeral and wrist RLE: tibiofemoral and tibiotalar LLE: tibiofemoral and tibiotalar  Swelling:  RUE: wrist LUE: wrist Right hand: 2nd MCP, 3rd MCP, 4th MCP, 5th MCP, 2nd PIP, 3rd PIP, 4th PIP and 5th PIP Left hand: 2nd MCP, 3rd MCP, 4th MCP, 5th MCP, 2nd PIP, 3rd PIP, 4th PIP and 5th PIP  Joint Counts:  CDAI Tender Joint count: 6 CDAI Swollen Joint count: 18     Investigation: No additional findings. CBC Latest Ref Rng & Units 04/17/2017 03/30/2017 02/25/2017  WBC 4.0 - 10.5 K/uL 4.8 3.6(L) 4.0  Hemoglobin 12.0 - 15.0 g/dL 62.2 29.7 98.9  Hematocrit 36.0 - 46.0 % 40.8 40.1 40.1  Platelets 150 - 400 K/uL 286 256 266.0   CMP Latest Ref Rng & Units 04/17/2017 03/30/2017 12/30/2016  Glucose 65 - 99 mg/dL 211(H) 86 91  BUN 6 - 20 mg/dL 15 11 11   Creatinine 0.44 - 1.00 mg/dL 4.17 4.08 1.44  Sodium 135 - 145 mmol/L 141 139 139  Potassium 3.5 - 5.1 mmol/L 3.4(L)  3.8 3.4(L)  Chloride 101 - 111 mmol/L 104 102 104  CO2 22 - 32 mmol/L 27 30 24   Calcium 8.9 - 10.3 mg/dL 8.9 9.2 9.5  Total Protein 6.1 - 8.1 g/dL - 7.6 8.1(E)  Total Bilirubin 0.2 - 1.2 mg/dL - 1.0 0.9  Alkaline Phos 38 - 126 U/L - - 65  AST 10 - 35 U/L - 16 19  ALT 6 - 29 U/L - 13 18    Imaging: Dg Chest 2 View  Result Date: 04/17/2017 CLINICAL DATA:  Chest pain. EXAM: CHEST - 2 VIEW COMPARISON:  Radiographs of July 01, 2016. FINDINGS: The heart size and mediastinal contours are within normal limits. Both lungs are clear. No pneumothorax or pleural effusion is noted. The visualized skeletal structures are unremarkable. IMPRESSION: No active cardiopulmonary disease. Electronically Signed   By: Lupita Raider, M.D.   On: 04/17/2017 14:56    Speciality Comments: No specialty comments available.    Procedures:  No procedures performed Allergies: Aspirin; Hydrocodone-acetaminophen; Ibuprofen; Imdur [isosorbide nitrate]; Meperidine hcl; Morphine; Oxycodone-acetaminophen; Procaine hcl; Toprol xl [metoprolol]; and Tramadol   Assessment / Plan:     Visit Diagnoses: Mixed connective tissue disease (HCC) - +ANA,+Sm,+RNP,+RF: She has bilateral extensor tenosynovitis and synovitis in the MCPs and PIP joints. She has noticed some improvement since her last visit since restarting on PLQ.  Due to her having active synovitis we will start her on Prednisone and allow more time for PLQ to start working.  She will be started on a prednisone taper starting at 10 mg for 1 week and tapering by 2.5 mg every week.  She will continue on methotrexate 0.8 mL subcutaneously once weekly, folic acid 2 mg daily, and Plaquenil 200 mg twice daily.  She has no oral or nasal ulcerations noted.  No malar rash noted.  She continues to have Raynaud's in bilateral hands.  No digital ulcerations.  No cervical lymphadenopathy.  She continues to have chronic insomnia and fatigue.    High risk medication use - PLQ, MTX 0.8 ml  weekly, folic acid 2 mg daily.  She had CBC and CMP drawn in March 2019.  She will return in June and every 3 months to monitor for drug toxicity.  She was advised to get her Plaquenil eye exam performed.  Trochanteric bursitis of both hips: She has tenderness of bilateral trochanteric bursa.  She performs exercises on a regular  basis.  Primary insomnia: Chronic.  She is very interrupted sleep at night.  Good sleep hygiene was discussed.  Chronic fatigue: Chronic and related to insomnia.  She was encouraged to stay active and exercise regularly.  Vitamin D deficiency: She is on a vitamin D supplement daily.  Other medical conditions are listed as follows:  History of asthma  History of hypertension  History of stomach ulcers  History of high cholesterol    Orders: No orders of the defined types were placed in this encounter.  No orders of the defined types were placed in this encounter.   Face-to-face time spent with patient was 30 minutes. >50% of time was spent in counseling and coordination of care.  Follow-Up Instructions: Return for MCTD.   Gearldine Bienenstock, PA-C  Note - This record has been created using Dragon software.  Chart creation errors have been sought, but may not always  have been located. Such creation errors do not reflect on  the standard of medical care.

## 2017-05-11 ENCOUNTER — Telehealth: Payer: Self-pay | Admitting: Family Medicine

## 2017-05-11 ENCOUNTER — Ambulatory Visit: Payer: BC Managed Care – PPO | Admitting: Physician Assistant

## 2017-05-11 ENCOUNTER — Encounter: Payer: Self-pay | Admitting: Physician Assistant

## 2017-05-11 VITALS — BP 122/78 | HR 76 | Resp 15 | Ht 65.0 in | Wt 210.0 lb

## 2017-05-11 DIAGNOSIS — M7061 Trochanteric bursitis, right hip: Secondary | ICD-10-CM

## 2017-05-11 DIAGNOSIS — Z79899 Other long term (current) drug therapy: Secondary | ICD-10-CM

## 2017-05-11 DIAGNOSIS — M351 Other overlap syndromes: Secondary | ICD-10-CM | POA: Diagnosis not present

## 2017-05-11 DIAGNOSIS — Z8639 Personal history of other endocrine, nutritional and metabolic disease: Secondary | ICD-10-CM

## 2017-05-11 DIAGNOSIS — Z8679 Personal history of other diseases of the circulatory system: Secondary | ICD-10-CM

## 2017-05-11 DIAGNOSIS — F5101 Primary insomnia: Secondary | ICD-10-CM

## 2017-05-11 DIAGNOSIS — Z8709 Personal history of other diseases of the respiratory system: Secondary | ICD-10-CM | POA: Diagnosis not present

## 2017-05-11 DIAGNOSIS — Z8719 Personal history of other diseases of the digestive system: Secondary | ICD-10-CM

## 2017-05-11 DIAGNOSIS — R5382 Chronic fatigue, unspecified: Secondary | ICD-10-CM

## 2017-05-11 DIAGNOSIS — M7062 Trochanteric bursitis, left hip: Secondary | ICD-10-CM

## 2017-05-11 DIAGNOSIS — E559 Vitamin D deficiency, unspecified: Secondary | ICD-10-CM

## 2017-05-11 DIAGNOSIS — Z8711 Personal history of peptic ulcer disease: Secondary | ICD-10-CM

## 2017-05-11 MED ORDER — DICLOFENAC SODIUM 1 % TD GEL: TRANSDERMAL | 3 refills | Status: AC

## 2017-05-11 MED ORDER — PREDNISONE 5 MG PO TABS: ORAL_TABLET | ORAL | 0 refills | Status: DC

## 2017-05-11 NOTE — Patient Instructions (Signed)
Standing Labs We placed an order today for your standing lab work.    Please come back and get your standing labs in June and every 3 months  We have open lab Monday through Friday from 8:30-11:30 AM and 1:30-4:00 PM  at the office of Dr. Shaili Deveshwar.   You may experience shorter wait times on Monday and Friday afternoons. The office is located at [address], [address], [place], [place] [address] No appointment is necessary.   Labs are drawn by [facility].  You may receive a bill from [facility] for your lab work. If you have any questions regarding directions or hours of operation,  please call [phone].    

## 2017-05-11 NOTE — Telephone Encounter (Signed)
Copied from CRM [id number]. Topic: Quick Communication - Rx Refill/Question >> May 11, 2017  9:35 AM Patricia Reed wrote: Medication: valACYclovir (VALTREX) 500 MG tablet Pt needs a Rx for this med right away. Pt is having an outbreak now and needs asap. Pt would like you to consider refills as with her lupus dx, this issue was brought on and pt has re-occurring. Advised pt to speak with Dr Swaziland about a regular Rx in the future.  [id number] [place], [place] - [address] [phone] (Phone) [phone] (Fax)

## 2017-05-11 NOTE — Telephone Encounter (Signed)
Request for Valtrex 500 mg tablet, medication not on pt's current list.   Pt's states she is currently having an outbreak.  LOV: 04/13/17  PCP: Dr[facility] [place]  [facility] [address]

## 2017-05-12 ENCOUNTER — Ambulatory Visit: Payer: BC Managed Care – PPO | Admitting: Family Medicine

## 2017-05-12 ENCOUNTER — Encounter: Payer: Self-pay | Admitting: Family Medicine

## 2017-05-12 ENCOUNTER — Telehealth: Payer: Self-pay | Admitting: Rheumatology

## 2017-05-12 VITALS — BP 122/76 | HR 82 | Temp 98.2°F | Resp 12 | Ht 65.0 in | Wt 209.4 lb

## 2017-05-12 DIAGNOSIS — M351 Other overlap syndromes: Secondary | ICD-10-CM

## 2017-05-12 DIAGNOSIS — A6000 Herpesviral infection of urogenital system, unspecified: Secondary | ICD-10-CM | POA: Insufficient documentation

## 2017-05-12 MED ORDER — VALACYCLOVIR HCL 500 MG PO TABS: ORAL_TABLET | ORAL | 2 refills | Status: DC

## 2017-05-12 NOTE — Telephone Encounter (Signed)
Patient called stating that she went to pick up her Diclofenac Sodium Gel and [facility] was told by [facility] that a prior authorization was required for the medication to be filled.

## 2017-05-12 NOTE — Telephone Encounter (Signed)
Left message for patient to call office to schedule appointment to come in today before Dr. Swaziland leave for medication refill.

## 2017-05-12 NOTE — Patient Instructions (Addendum)
A few things to remember from today's visit:   Systemic lupus erythematosus, unspecified SLE type, unspecified organ involvement status (HCC)  Recurrent genital herpes - Plan: valACYclovir (VALTREX) 500 MG tablet   If episodes continue happening monthly we need to consider taking Valtrex daily.  Please be sure medication list is accurate. If a new problem present, please set up appointment sooner than planned today.

## 2017-05-12 NOTE — Telephone Encounter (Signed)
Patient came into office for medication refill on 05/12/17.

## 2017-05-12 NOTE — Progress Notes (Signed)
ACUTE VISIT   HPI:  Chief Complaint  Patient presents with  . Medication Refill    having flare-up    [name] is a 57 y.o. female, who is here today complaining of tender vesicular lesion on external genitals. Started 3 days ago with tingling sensation,next day tender lesions.  Since she was diagnosed will report she has had similar episodes, every 2 to 3 months but seems to be more frequent now.  Last episode was last month. She has not identified exacerbating factors.  In the past she has taken Valtrex as needed.   She is having arthralgias, frustrated because Hx of lupus and asking if it is something to cure it. She is currently following with rheumatologist I am planning on taking a prescription for prednisone to treat current symptoms. She is on methotrexate injections.   Review of Systems  Constitutional: Positive for fatigue. Negative for chills and fever.  HENT: Negative for mouth sores.   Gastrointestinal: Negative for abdominal pain, nausea and vomiting.  Genitourinary: Positive for genital sores. Negative for decreased urine volume, dysuria, hematuria, vaginal bleeding and vaginal discharge.  Musculoskeletal: Positive for arthralgias, joint swelling and myalgias.  Skin: Positive for rash. Negative for wound.  Neurological: Negative for weakness and headaches.  Psychiatric/Behavioral: The patient is nervous/anxious.       Current Outpatient Medications on File Prior to Visit  Medication Sig Dispense Refill  . acetaminophen (TYLENOL) 650 MG CR tablet Take 650 mg by mouth 2 (two) times daily.     . diclofenac sodium (VOLTAREN) 1 % GEL Apply 3 g to 3 large joints up to 3 times daily. 3 Tube 3  . fluticasone (FLONASE) 50 MCG/ACT nasal spray Place 2 sprays into both nostrils daily. (Patient taking differently: Place 2 sprays into both nostrils as needed. ) 16 g 6  . folic acid (FOLVITE) 1 MG tablet Take 1 tablet (1 mg total) by mouth 2 (two)  times daily. 180 tablet 4  . hydroxychloroquine (PLAQUENIL) 200 MG tablet Take 200mg  by mouth BID, Monday- Friday only. 40 tablet 0  . methotrexate 50 MG/2ML injection Inject 0.8 mLs (20 mg total) into the skin once a week. (Patient taking differently: Inject 20 mg into the skin once a week. Sunday) 10 mL 0  . pantoprazole (PROTONIX) 40 MG tablet Take 1 tablet (40 mg total) by mouth daily. 90 tablet 1  . predniSONE (DELTASONE) 5 MG tablet Take 2 tablets (10 mg total) by mouth for 1 wk, 1.5 tablets (7.5 mg) po for 1 wk,1 tablet (5 mg) po for 1 wk, 1/2 tablet (2.5 mg) po for1 wk 35 tablet 0  . Tuberculin-Allergy Syringes (ALLERGY SYRINGE 1CC/27GX1/2") 27G X 1/2" 1 ML MISC Patient to use to inject MTX weekly 12 each 3  . vitamin B-12 (CYANOCOBALAMIN) 100 MCG tablet Take 100 mcg by mouth daily.    Friday VITAMIN D, ERGOCALCIFEROL, PO Take 1 capsule by mouth daily.     No current facility-administered medications on file prior to visit.      Past Medical History:  Diagnosis Date  . Anemia   . Asthma   . GERD (gastroesophageal reflux disease)   . History of stomach ulcers   . Lupus (HCC)   . MI, old   . Rheumatoid arthritis (HCC)   . Thyroid mass   . Vertigo    Allergies  Allergen Reactions  . Aspirin Other (See Comments)    GI bleed  . Hydrocodone-Acetaminophen Hives  and Nausea And Vomiting  . Ibuprofen Other (See Comments)    GI bleed  . Imdur [Isosorbide Nitrate] Other (See Comments)    Reaction unknown  . Meperidine Hcl Hives and Nausea And Vomiting    Short term memory loss  . Morphine Hives and Nausea And Vomiting  . Oxycodone-Acetaminophen Hives and Nausea And Vomiting    Reaction unknown  . Procaine Hcl Other (See Comments)    Ineffective  . Toprol Xl [Metoprolol] Other (See Comments)    Reaction unknown  . Tramadol Itching    Social History   Socioeconomic History  . Marital status: Married    Spouse name: Not on file  . Number of children: 4  . Years of education:  Not on file  . Highest education level: Not on file  Occupational History  . Occupation: Lobbyist: [employer]  Social Needs  . Financial resource strain: Not on file  . Food insecurity:    Worry: Not on file    Inability: Not on file  . Transportation needs:    Medical: Not on file    Non-medical: Not on file  Tobacco Use  . Smoking status: Never Smoker  . Smokeless tobacco: Never Used  Substance and Sexual Activity  . Alcohol use: No    Alcohol/week: 0.0 oz  . Drug use: No  . Sexual activity: Yes    Birth control/protection: None, Surgical    Comment: LAVH  Lifestyle  . Physical activity:    Days per week: Not on file    Minutes per session: Not on file  . Stress: Not on file  Relationships  . Social connections:    Talks on phone: Not on file    Gets together: Not on file    Attends religious service: Not on file    Active member of club or organization: Not on file    Attends meetings of clubs or organizations: Not on file    Relationship status: Not on file  Other Topics Concern  . 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      Vitals:   05/12/17 1138  BP: 122/76  Pulse: 82  Resp: 12  Temp: 98.2 F (36.8 C)  SpO2: 99%   Body mass index is 34.84 kg/m.   Physical Exam  Nursing note reviewed. Constitutional: She is oriented to person, place, and time. She appears well-developed. No distress.  HENT:  Head: Normocephalic and atraumatic.  Mouth/Throat: Oropharynx is clear and moist and mucous membranes are normal.  Eyes: Conjunctivae are normal.  Cardiovascular: Normal rate and regular rhythm.  Respiratory: Effort normal and breath sounds normal. No respiratory distress.  GI: Soft. She exhibits no mass. There is no tenderness.  Musculoskeletal: She exhibits tenderness. She exhibits no edema.  IP joint mild edema and tenderness. Ankles and tarsus  tenderness,no edema.  Neurological: She is alert and oriented to person, place, and time.  Skin: Skin is warm. Rash noted.  On perineal area, right side and lateral to vaginal introitus 2 small superficial excoriations, 1-2 mm, very tender with palpation. No induration,erythema,or edema appreciated.  Psychiatric: Her speech is normal. Her mood appears anxious.  Well-groomed, good eye contact.    ASSESSMENT AND PLAN:   Ms. Lashika was seen today for medication refill.  Diagnoses and all orders for this visit:  Recurrent genital herpes  Valtrex 500 mg bid for  3 days. If she continues having monthly exacerbations we will consider daily Valtrex. F/U as needed.  -     valACYclovir (VALTREX) 500 MG tablet; 1 tab bid for 3 days, start < 48 hours after onset.  Mixed connective tissue disease (HCC)   She reported history of lupus, reviewing rheumatology notes she has history of fibromyalgia and connective tissue disorder. We discussed signs and symptoms of lupus as well as treatment options and prognosis. She will continue following with rheumatologist.       [name]. [place], MD  [facility]. [place] office.

## 2017-05-12 NOTE — Telephone Encounter (Signed)
A prior authorization for Voltaren gel has been submitted to pts insurance via cover my meds. Will update once we have a response.   Benjiman Sedgwick, [place], CPhT 12:13 PM

## 2017-05-12 NOTE — Assessment & Plan Note (Signed)
She will start Valtrex 500 mg twice daily for 3 days. Recommend to treat earlier for future episodes. If she continues having monthly flareups we need to consider daily Valtrex. Follow-up as needed.

## 2017-05-13 ENCOUNTER — Telehealth: Payer: Self-pay | Admitting: Rheumatology

## 2017-05-13 NOTE — Telephone Encounter (Signed)
See previous telephone note. 

## 2017-05-13 NOTE — Telephone Encounter (Signed)
Patient called stating she was returning your call.   °

## 2017-05-13 NOTE — Telephone Encounter (Signed)
Received a fax from [facility] regarding a prior authorization DENIAL for VOLTAREN GEL.   Reference number:[id number] Phone [phone]  Will send document to scan center.   Called pt to update. Left message.  Jock Mahon, [place], CPhT 8:11 AM

## 2017-05-13 NOTE — Telephone Encounter (Signed)
Patient returned call. Gave her the information for the GoodRx card to use at the pharmacy. Patient voices understanding and denies any questions at this time.  Kenika Sahm, [place], CPhT 11:59 AM

## 2017-05-17 ENCOUNTER — Telehealth: Payer: Self-pay | Admitting: Rheumatology

## 2017-05-17 NOTE — Telephone Encounter (Signed)
[facility] left voicemail message needing prior auth for the Voltaren Gel. The number to contact Patricia Reed is [phone]

## 2017-05-17 NOTE — Telephone Encounter (Signed)
Returned call. Explained that the auth was denied. Patient is aware and plans to use a goodrx coupon. Associate understands and denies any questions at this time.   Patricia Reed, [place], CPhT 4:39 PM

## 2017-05-20 ENCOUNTER — Telehealth: Payer: Self-pay | Admitting: Rheumatology

## 2017-05-20 NOTE — Telephone Encounter (Signed)
Patient called requesting prescription refill of Plaquenil.  [facility] [address] [facility] [place].

## 2017-05-20 NOTE — Telephone Encounter (Addendum)
Spoke with patient and advised she needs an updated PLQ eye exam. Patient states she has had that done and will have that sent over. Patient states she just picked up a refill of medication at the pharmacy.   Patient states she is having pain. Patient states she is having trouble raising hands above her head and unable to close her hands. Patient states she is also having trouble walking. Patient is on PLQ and MTX 0.8 mL weekly. Patient is also currently on Prednisone 7.5 mg. Patient is requesting injections. Please advise.

## 2017-05-24 NOTE — Telephone Encounter (Signed)
Patient advised and verbalized understanding. Patient has been scheduled for 05/26/17.

## 2017-05-24 NOTE — Telephone Encounter (Signed)
Attempted to contact the patient and left message for patient to call the office.  

## 2017-05-24 NOTE — Telephone Encounter (Signed)
Please advise patient to increase Prednisone to 10 mg po daily. Please schedule her for a follow up visit this week.  We will need to discuss possibly adding another medication such as Orencia to her treatment regimen since she had active synovitis at her last visit.

## 2017-05-25 NOTE — Progress Notes (Signed)
Office Visit Note  [name]             Date of Birth: 11-27-1960           MRN: [id number]             PCP: [place], [name], MD Referring: [place], [name], MD Visit Date: 05/26/2017 Occupation: @GUAROCC @    Subjective:  Generalized pain    History of Present Illness: Patricia Reed is a 57 y.o. female with history of mixed connective tissue disease and fibromyalgia.  Patient is currently taking PLQ 200 mg BID, MTX 0.8 ml subcutaneous injections on Sundays, and folic acid 2 mg daily.  She is currently taking Prednisone 10 mg daily.  She reports she is having right sided chest wall pain.  She states she has pain in her chest wall with ROM of her right shoulder.  Is having generalized muscle aches and muscle tenderness.  She is also having pain in her bilateral hands bilateral knees and bilateral ankles.  She states that the swelling has improved since increasing her dose of prednisone to 10 mg daily.  She also complains of discomfort in her neck which is in the trapezius area.    Activities of Daily Living:  Patient reports morning stiffness for 3 hours.   Patient Reports nocturnal pain.  Difficulty dressing/grooming: Denies Difficulty climbing stairs: Reports Difficulty getting out of chair: Reports Difficulty using hands for taps, buttons, cutlery, and/or writing: Reports   Review of Systems  Constitutional: Positive for fatigue. Negative for night sweats, weight gain and weight loss.  HENT: Positive for mouth dryness. Negative for mouth sores, trouble swallowing, trouble swallowing and nose dryness.   Eyes: Negative for pain, redness, visual disturbance and dryness.  Respiratory: Negative for cough, hemoptysis, shortness of breath and difficulty breathing.   Cardiovascular: Negative for chest pain, palpitations, hypertension, irregular heartbeat and swelling in legs/feet.  Gastrointestinal: Positive for constipation. Negative for blood in stool and  diarrhea.  Endocrine: Negative for increased urination.  Genitourinary: Negative for painful urination and vaginal dryness.  Musculoskeletal: Positive for arthralgias, joint pain, joint swelling, myalgias, morning stiffness, muscle tenderness and myalgias. Negative for muscle weakness.  Skin: Negative for color change, pallor, rash, hair loss, nodules/bumps, skin tightness, ulcers and sensitivity to sunlight.  Allergic/Immunologic: Negative for susceptible to infections.  Neurological: Negative for dizziness, numbness, headaches, memory loss, night sweats and weakness.  Hematological: Negative for swollen glands.  Psychiatric/Behavioral: Positive for sleep disturbance. Negative for depressed mood. The patient is not nervous/anxious.     PMFS History:  Patient Active Problem List   Diagnosis Date Noted  . Recurrent genital herpes 05/12/2017  . Shortness of breath 04/15/2017  . Dizziness 02/04/2017  . Situational anxiety 06/08/2016  . Atypical chest pain 06/08/2016  . Trapezius muscle spasm 04/23/2016  . Fibromyalgia 02/13/2016  . 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 Chronic fatigue 02/13/2016  . Vitamin D deficiency 11/25/2015  . Autoimmune disease (HCC) 11/23/2015  . High risk medication use 11/23/2015  . Colon cancer screening 01/25/2014  . Anterior neck pain 12/01/2013  . Inflammatory arthritis 08/18/2012  . Allergic rhinitis 06/23/2012  . HTN (hypertension) 06/23/2012  . High cholesterol 06/23/2012  . Thyroid nodule - benign 06/23/2012  . Arthralgia 06/23/2012  . Mild intermittent asthma   . Sickle cell trait (HCC)   . History of stomach ulcers   . ESOPHAGEAL STRICTURE 10/04/2007  . GERD 10/04/2007  . 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 Dysphagia, pharyngoesophageal phase  10/04/2007    Past Medical History:  Diagnosis Date  . Anemia   . Asthma   . GERD (gastroesophageal reflux disease)   . History of stomach ulcers   . Lupus (HCC)   . MI, old   . Rheumatoid arthritis (HCC)    . Thyroid mass   . Vertigo     Family History  Problem Relation Age of Onset  . Alcohol abuse Father   . Hypertension Father   . Heart disease Father   . Colon cancer Maternal Uncle   . Diabetes Maternal Grandmother   . Colon polyps Neg Hx   . Esophageal cancer Neg Hx   . Kidney disease Neg Hx   . Stomach cancer Neg Hx   . Rectal cancer Neg Hx    Past Surgical History:  Procedure Laterality Date  . ABDOMINAL HYSTERECTOMY     done for menorrhagia, ovaries remain  . CERVICAL DISCECTOMY     plates and screws in neck  . CESAREAN SECTION    . COLONOSCOPY    . ESOPHAGEAL MANOMETRY N/A 02/12/2014   Procedure: ESOPHAGEAL MANOMETRY (EM);  Surgeon: Louis Meckel, MD;  Location: [facility];  Service: Endoscopy;  Laterality: N/A;  . ESOPHAGOGASTRODUODENOSCOPY  03/04/2011   Procedure: ESOPHAGOGASTRODUODENOSCOPY (EGD);  Surgeon: Louis Meckel, MD;  Location: [facility];  Service: Endoscopy;  Laterality: N/A;  BOTOX Injection  . exc benign breast lump    . TONSILLECTOMY    . 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       Objective: Vital Signs: BP 121/82 (BP Location: Left Arm, Patient Position: Sitting, Cuff Size: Normal)   Pulse 85   Resp 17   Ht 5\' 5"  (1.651 m)   Wt 210 lb (95.3 kg)   BMI 34.95 kg/m    Physical Exam  Constitutional: She is oriented to person, place, and time. She appears well-developed and well-nourished.  HENT:  Head: Normocephalic and atraumatic.  Eyes: Conjunctivae and EOM are normal.  Neck: Normal range of motion.  Cardiovascular: Normal rate, regular rhythm, normal heart sounds and intact distal pulses.  Pulmonary/Chest: Effort normal and breath sounds normal.  Abdominal: Soft. Bowel sounds are normal.  Lymphadenopathy:    She has no cervical adenopathy.  Neurological: She is alert and oriented to person, place, and time.    Skin: Skin is warm and dry. Capillary refill takes less than 2 seconds.  Psychiatric: She has a normal mood and affect. Her behavior is normal.  Nursing note and vitals reviewed.    Musculoskeletal Exam: C-spine limited ROM with lateral rotation to the right with discomfort.  Trapezius muscle tenderness.  Right costochondral tenderness.  She has painful range of motion of her right shoulder.  She has good range of motion of her left shoulder.  Elbow joints, wrist joints, MCPs, PIPs, DIPs good range of motion but she had tenderness on palpation..  Hip joints, knee joints, ankle joints, MTPs, PIPs, DIPs good range of motion no synovitis.  No warmth or effusion of bilateral knees.   CDAI Exam: CDAI Homunculus Exam:   Tenderness:  RUE: wrist LUE: wrist Right hand: 1st MCP, 2nd MCP, 3rd MCP, 4th MCP and 5th MCP Left hand: 1st MCP, 2nd MCP, 3rd MCP, 4th MCP and 5th MCP RLE: tibiofemoral and tibiotalar LLE: tibiofemoral and tibiotalar  Joint Counts:  CDAI Tender Joint count: 14 CDAI  Swollen Joint count: 0  Global Assessments:  Patient Global Assessment: 8 Provider Global Assessment: 3  CDAI Calculated Score: 25    Investigation: No additional findings. CBC Latest Ref Rng & Units 04/17/2017 03/30/2017 02/25/2017  WBC 4.0 - 10.5 K/uL 4.8 3.6(L) 4.0  Hemoglobin 12.0 - 15.0 g/dL 50.9 32.6 71.2  Hematocrit 36.0 - 46.0 % 40.8 40.1 40.1  Platelets 150 - 400 K/uL 286 256 266.0   CMP Latest Ref Rng & Units 04/17/2017 03/30/2017 12/30/2016  Glucose 65 - 99 mg/dL 458(K) 86 91  BUN 6 - 20 mg/dL 15 11 11   Creatinine 0.44 - 1.00 mg/dL 9.98 3.38  Sodium 135 - 145 mmol/L 141 139 139  Potassium 3.5 - 5.1 mmol/L 3.4(L) 3.8 3.4(L)  Chloride 101 - 111 mmol/L 104 102 104  CO2 22 - 32 mmol/L 27 30 24   Calcium 8.9 - 10.3 mg/dL 8.9 9.2 9.5  Total Protein 6.1 - 8.1 g/dL - 7.6 2.50)  Total Bilirubin 0.2 - 1.2 mg/dL - 1.0 0.9  Alkaline Phos 38 - 126 U/L - - 65  AST 10 - 35 U/L - 16 19  ALT 6 - 29  U/L - 13 18    Imaging: Extrem Up Bilat Comp  Result Date: 05/26/2017 Ultrasound examination of bilateral hands was performed per EULAR recommendations. Using 12 MHz transducer, grayscale and power Doppler bilateral second, third, and fifth MCP joints and bilateral wrist joints both dorsal and volar aspects were evaluated to look for synovitis or tenosynovitis. The findings were there was no synovitis or tenosynovitis on ultrasound examination. Right median nerve was 0.07 cm squares which was within normal limits and left median nerve was 0.05 cm squares which was within normal limits. Impression: Ultrasound examination did not show any synovitis on examination.  Bilateral median nerves are within normal limits.   Speciality Comments: No specialty comments available.    Procedures:  No procedures performed Allergies: Aspirin; Hydrocodone-acetaminophen; Ibuprofen; Imdur [isosorbide nitrate]; Meperidine hcl; Morphine; Oxycodone-acetaminophen; Procaine hcl; Toprol xl [metoprolol]; and Tramadol   Assessment / Plan:     Visit Diagnoses: Mixed connective tissu disease(HCC)-+ANA,+Sm,+RNP,+RF, arthritis.  Her most recent labs showed negative double-stranded DNA normal complements and slightly elevated sedimentation rate.  She continues to have positive ANA titer.  Patient reports history of recent increased pain and swelling in multiple joints.  She states she was unable to decrease prednisone below 10 mg.  She did not have synovitis on examination although she had tenderness in her wrist joints and MCP joints bilaterally.  An ultrasound of bilateral hands was performed which was negative for synovitis.  High risk medication use - PLQ 200 mg BID, MTX 0.8 ml weekly, folic acid 2 mg dailyPrednisone 10 mg daily.  Her labs have been stable we will continue to monitor her labs every 3 months.  Neck pain-she has thought of a spasm in the bilateral trapezius area per her request bilateral trapezius area was  injected with cortisone and lidocaine as described above.  She tolerated the procedure well.  Pain in both hands - Plan: [place] Extrem Up Bilat Comp.  The ultrasound was negative for synovitis.  Bilateral median nerves are within normal limits.  Costochondritis, acute-she has right-sided reproducible chest wall pain in the costochondral region.  I will increase her prednisone to 20 mg for 4 days and taper by 5 mg every 4 days.  She will discontinue prednisone after that.  Trochanteric bursitis of both hips- she has been just doing stretching exercises  which are helpful.  Fibromyalgia-she continues to have some generalized pain and discomfort.  Primary insomnia-good sleep hygiene was discussed.  Chronic fatigue-related to insomnia.  Other medical problems are listed as follows:  Vitamin D deficiency  History of asthma  History of hypertension-blood pressure was controlled today.  History of stomach ulcers  History of high cholesterol    Orders: Orders Placed This Encounter  Procedures  . [place] Extrem Up Bilat Comp   Meds ordered this encounter  Medications  . predniSONE (DELTASONE) 5 MG tablet    Sig: Take 4 tabs x4 days, 3 tabs x4 days, 2 tabs x4 days, 1 tab x4 days and then 1/2 x4 days.    Dispense:  42 tablet    Refill:  0    Face-to-face time spent with patient was 30 minutes.> 50% of time was spent in counseling and coordination of care.  Follow-Up Instructions: Return in about 1 month (around 06/23/2017) for Mixed connective tissue disease , Fibromyalgia.   Pollyann Savoy, MD  Note - This record has been created using Animal nutritionist.  Chart creation errors have been sought, but may not always  have been located. Such creation errors do not reflect on  the standard of medical care.

## 2017-05-26 ENCOUNTER — Ambulatory Visit (INDEPENDENT_AMBULATORY_CARE_PROVIDER_SITE_OTHER): Payer: Self-pay

## 2017-05-26 ENCOUNTER — Encounter: Payer: Self-pay | Admitting: Physician Assistant

## 2017-05-26 ENCOUNTER — Ambulatory Visit: Payer: BC Managed Care – PPO | Admitting: Rheumatology

## 2017-05-26 VITALS — BP 121/82 | HR 85 | Resp 17 | Ht 65.0 in | Wt 210.0 lb

## 2017-05-26 DIAGNOSIS — M351 Other overlap syndromes: Secondary | ICD-10-CM | POA: Diagnosis not present

## 2017-05-26 DIAGNOSIS — M7061 Trochanteric bursitis, right hip: Secondary | ICD-10-CM | POA: Diagnosis not present

## 2017-05-26 DIAGNOSIS — Z8711 Personal history of peptic ulcer disease: Secondary | ICD-10-CM

## 2017-05-26 DIAGNOSIS — Z79899 Other long term (current) drug therapy: Secondary | ICD-10-CM

## 2017-05-26 DIAGNOSIS — Z8719 Personal history of other diseases of the digestive system: Secondary | ICD-10-CM

## 2017-05-26 DIAGNOSIS — R5382 Chronic fatigue, unspecified: Secondary | ICD-10-CM

## 2017-05-26 DIAGNOSIS — M940 Chondrocostal junction syndrome [Tietze]: Secondary | ICD-10-CM | POA: Diagnosis not present

## 2017-05-26 DIAGNOSIS — M79641 Pain in right hand: Secondary | ICD-10-CM

## 2017-05-26 DIAGNOSIS — M79642 Pain in left hand: Secondary | ICD-10-CM

## 2017-05-26 DIAGNOSIS — M542 Cervicalgia: Secondary | ICD-10-CM

## 2017-05-26 DIAGNOSIS — Z8709 Personal history of other diseases of the respiratory system: Secondary | ICD-10-CM

## 2017-05-26 DIAGNOSIS — F5101 Primary insomnia: Secondary | ICD-10-CM

## 2017-05-26 DIAGNOSIS — M7062 Trochanteric bursitis, left hip: Secondary | ICD-10-CM

## 2017-05-26 DIAGNOSIS — M797 Fibromyalgia: Secondary | ICD-10-CM | POA: Diagnosis not present

## 2017-05-26 DIAGNOSIS — Z8639 Personal history of other endocrine, nutritional and metabolic disease: Secondary | ICD-10-CM

## 2017-05-26 DIAGNOSIS — E559 Vitamin D deficiency, unspecified: Secondary | ICD-10-CM

## 2017-05-26 DIAGNOSIS — Z8679 Personal history of other diseases of the circulatory system: Secondary | ICD-10-CM

## 2017-05-26 MED ORDER — PREDNISONE 5 MG PO TABS: ORAL_TABLET | ORAL | 0 refills | Status: DC

## 2017-06-14 ENCOUNTER — Other Ambulatory Visit: Payer: Self-pay | Admitting: Rheumatology

## 2017-06-29 ENCOUNTER — Other Ambulatory Visit: Payer: Self-pay

## 2017-06-29 DIAGNOSIS — Z79899 Other long term (current) drug therapy: Secondary | ICD-10-CM

## 2017-06-29 LAB — COMPLETE METABOLIC PANEL WITH GFR
AG Ratio: 1.4 (calc) (ref 1.0–2.5)
ALKALINE PHOSPHATASE (APISO): 61 U/L (ref 33–130)
ALT: 9 U/L (ref 6–29)
AST: 14 U/L (ref 10–35)
Albumin: 4.2 g/dL (ref 3.6–5.1)
BILIRUBIN TOTAL: 0.6 mg/dL (ref 0.2–1.2)
BUN: 10 mg/dL (ref 7–25)
CO2: 30 mmol/L (ref 20–32)
CREATININE: 0.66 mg/dL (ref 0.50–1.05)
Calcium: 9.3 mg/dL (ref 8.6–10.4)
Chloride: 104 mmol/L (ref 98–110)
GFR, EST AFRICAN AMERICAN: 115 mL/min/{1.73_m2} (ref >=60)
GFR, Est Non African American: 99 mL/min/{1.73_m2} (ref >=60)
GLUCOSE: 82 mg/dL (ref 65–99)
Globulin: 3.1 g/dL (calc) (ref 1.9–3.7)
Potassium: 4 mmol/L (ref 3.5–5.3)
Sodium: 142 mmol/L (ref 135–146)
TOTAL PROTEIN: 7.3 g/dL (ref 6.1–8.1)

## 2017-06-29 LAB — CBC WITH DIFFERENTIAL/PLATELET
Basophils Absolute: 20 cells/uL (ref 0–200)
Basophils Relative: 0.5 %
Eosinophils Absolute: 51 cells/uL (ref 15–500)
Eosinophils Relative: 1.3 %
HEMATOCRIT: 37.6 % (ref 35.0–45.0)
HEMOGLOBIN: 12.3 g/dL (ref 11.7–15.5)
LYMPHS ABS: 1084 {cells}/uL (ref 850–3900)
MCH: 27.6 pg (ref 27.0–33.0)
MCHC: 32.7 g/dL (ref 32.0–36.0)
MCV: 84.5 fL (ref 80.0–100.0)
MPV: 10.2 fL (ref 7.5–12.5)
Monocytes Relative: 5.7 %
NEUTROS ABS: 2523 {cells}/uL (ref 1500–7800)
Neutrophils Relative %: 64.7 %
Platelets: 319 10*3/uL (ref 140–400)
RBC: 4.45 10*6/uL (ref 3.80–5.10)
RDW: 12.9 % (ref 11.0–15.0)
Total Lymphocyte: 27.8 %
WBC: 3.9 10*3/uL (ref 3.8–10.8)
WBCMIX: 222 {cells}/uL (ref 200–950)

## 2017-06-30 ENCOUNTER — Other Ambulatory Visit: Payer: Self-pay | Admitting: Rheumatology

## 2017-06-30 NOTE — Progress Notes (Signed)
WNLs

## 2017-06-30 NOTE — Telephone Encounter (Signed)
Last Visit: 05/26/17 Next visit: 07/15/17 Labs: 06/29/17 WNL Eye exam: 04/17/2017, patient is scheduled for a visual field this week  Okay to refill 30 day supply  per Dr. Corliss Skains

## 2017-07-07 ENCOUNTER — Telehealth: Payer: Self-pay | Admitting: Rheumatology

## 2017-07-07 DIAGNOSIS — M351 Other overlap syndromes: Secondary | ICD-10-CM

## 2017-07-07 MED ORDER — METHOTREXATE SODIUM CHEMO INJECTION 50 MG/2ML: 20.0000 mg | INTRAMUSCULAR | 0 refills | Status: DC

## 2017-07-07 NOTE — Telephone Encounter (Signed)
Patient also stated she needs a referral in order to get an appointment at the [facility].  Patient states she would" like to see what the center is all about."

## 2017-07-07 NOTE — Telephone Encounter (Signed)
Ok

## 2017-07-07 NOTE — Telephone Encounter (Signed)
Last Visit: 05/26/17 Next Visit: 07/15/17 Labs: 06/29/17 WNL  Okay to refill per Dr. Corliss Skains

## 2017-07-07 NOTE — Telephone Encounter (Signed)
Referral placed.

## 2017-07-07 NOTE — Telephone Encounter (Signed)
Patient called stating she has an eye appointment today at 11:00 at [facility] on [address].  Patient requested prescription refill of Methotrexate to be sent to [facility] on [address].  Patient also stated she needs a referral in order to get an appointment at the [facility].  Patient states she would" like to see what the center is all about."

## 2017-07-09 ENCOUNTER — Other Ambulatory Visit: Payer: Self-pay | Admitting: *Deleted

## 2017-07-09 MED ORDER — FOLIC ACID 1 MG PO TABS: 2.0000 mg | ORAL_TABLET | Freq: Every day | ORAL | 3 refills | Status: DC

## 2017-07-09 NOTE — Telephone Encounter (Signed)
Refill request received via fax  Last Visit: 05/26/17 Next visit: 07/15/17  Okay to refill per Dr. Corliss Skains

## 2017-07-09 NOTE — Progress Notes (Signed)
 Office Visit Note  [name]             Date of Birth: 07/27/1960           MRN: [id number]             PCP: Jordan, [name], MD Referring: Bedsole, Amy E, MD Visit Date: 07/15/2017 Occupation: @GUAROCC@    Subjective:  Pain and swelling in all the joints.   History of Present Illness: Patricia Reed is a 57 y.o. female with history of mixed connective tissue disease and fibromyalgia.  She states all of her joints are painful and swollen.  She has been having nocturnal pain and is not able to sleep at night.  He needs to have generalized pain from fibromyalgia.  She has trochanteric bursitis bilaterally which is a still painful.  She states she had severe nosebleed this morning which did not stop for a while.  She has been seeing cardiology and pulmonology on regular basis.  Activities of Daily Living:  Patient reports morning stiffness for 90 minutes.   Patient Reports nocturnal pain.  Difficulty dressing/grooming: Denies Difficulty climbing stairs: Reports Difficulty getting out of chair: Reports Difficulty using hands for taps, buttons, cutlery, and/or writing: Reports   Review of Systems  Constitutional: Positive for fatigue. Negative for night sweats, weight gain and weight loss.  HENT: Positive for mouth dryness. Negative for mouth sores, trouble swallowing, trouble swallowing and nose dryness.   Eyes: Negative for pain, redness, visual disturbance and dryness.  Respiratory: Negative for cough, shortness of breath and difficulty breathing.   Cardiovascular: Negative for chest pain, palpitations, hypertension, irregular heartbeat and swelling in legs/feet.  Gastrointestinal: Negative for blood in stool, constipation and diarrhea.  Endocrine: Negative for increased urination.  Genitourinary: Negative for vaginal dryness.  Musculoskeletal: Positive for arthralgias, joint pain, joint swelling and morning stiffness. Negative for myalgias, muscle weakness,  muscle tenderness and myalgias.  Skin: Positive for color change. Negative for rash, hair loss, skin tightness, ulcers and sensitivity to sunlight.  Allergic/Immunologic: Negative for susceptible to infections.  Neurological: Negative for dizziness, memory loss, night sweats and weakness.  Hematological: Positive for bruising/bleeding tendency. Negative for swollen glands.  Psychiatric/Behavioral: Positive for depressed mood and sleep disturbance. The patient is not nervous/anxious.     PMFS History:  Patient Active Problem List   Diagnosis Date Noted  . Recurrent genital herpes 05/12/2017  . Shortness of breath 04/15/2017  . Dizziness 02/04/2017  . Situational anxiety 06/08/2016  . Atypical chest pain 06/08/2016  . Trapezius muscle spasm 04/23/2016  . Fibromyalgia 02/13/2016  . 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 Chronic fatigue 02/13/2016  . Vitamin D deficiency 11/25/2015  . Autoimmune disease (HCC) 11/23/2015  . High risk medication use 11/23/2015  . Colon cancer screening 01/25/2014  . Anterior neck pain 12/01/2013  . Inflammatory arthritis 08/18/2012  . Allergic rhinitis 06/23/2012  . HTN (hypertension) 06/23/2012  . High cholesterol 06/23/2012  . Thyroid nodule - benign 06/23/2012  . Arthralgia 06/23/2012  . Mild intermittent asthma   . Sickle cell trait (HCC)   . History of stomach ulcers   . ESOPHAGEAL STRICTURE 10/04/2007  . GERD 10/04/2007  . 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 Dysphagia, pharyngoesophageal phase 10/04/2007    Past Medical History:  Diagnosis Date  . Anemia   . Asthma   . GERD (gastroesophageal reflux disease)   . History of stomach ulcers   . Lupus (HCC)   . MI, old   .   Rheumatoid arthritis (HCC)   . Thyroid mass   . Vertigo     Family History  Problem Relation Age of Onset  . Alcohol abuse Father   . Hypertension Father   . Heart disease Father   . Colon cancer Maternal Uncle   . Diabetes Maternal Grandmother   . Colon polyps Neg Hx   .  Esophageal cancer Neg Hx   . Kidney disease Neg Hx   . Stomach cancer Neg Hx   . Rectal cancer Neg Hx    Past Surgical History:  Procedure Laterality Date  . ABDOMINAL HYSTERECTOMY     done for menorrhagia, ovaries remain  . CERVICAL DISCECTOMY     plates and screws in neck  . CESAREAN SECTION    . COLONOSCOPY    . ESOPHAGEAL MANOMETRY N/A 02/12/2014   Procedure: ESOPHAGEAL MANOMETRY (EM);  Surgeon: Robert D Kaplan, MD;  Location: [facility];  Service: Endoscopy;  Laterality: N/A;  . ESOPHAGOGASTRODUODENOSCOPY  03/04/2011   Procedure: ESOPHAGOGASTRODUODENOSCOPY (EGD);  Surgeon: Robert D Kaplan, MD;  Location: [facility];  Service: Endoscopy;  Laterality: N/A;  BOTOX Injection  . exc benign breast lump    . TONSILLECTOMY    . UPPER GASTROINTESTINAL ENDOSCOPY     Social History   Social History Narrative   First grade teacher assistant.  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       Objective: Vital Signs: BP (!) 126/95 (BP Location: Left Arm, Patient Position: Sitting, Cuff Size: Normal)   Pulse 75   Resp 15   Ht 5' 5" (1.651 m)   Wt 209 lb (94.8 kg)   BMI 34.78 kg/m    Physical Exam  Constitutional: She is oriented to person, place, and time. She appears well-developed and well-nourished.  HENT:  Head: Normocephalic and atraumatic.  Eyes: Conjunctivae and EOM are normal.  Neck: Normal range of motion.  Cardiovascular: Normal rate, regular rhythm, normal heart sounds and intact distal pulses.  Pulmonary/Chest: Effort normal and breath sounds normal.  Abdominal: Soft. Bowel sounds are normal.  Lymphadenopathy:    She has no cervical adenopathy.  Neurological: She is alert and oriented to person, place, and time.  Skin: Skin is warm and dry. Capillary refill takes less than 2 seconds.  Psychiatric: She has a normal mood and affect. Her behavior is normal.  Nursing note and vitals reviewed.    Musculoskeletal Exam: Spine thoracic lumbar spine good range of  motion.  Shoulder joints elbow joints wrist joints MCPs PIPs DIPs are in good range of motion with no synovitis.  She did have discomfort range of motion of all of her joints.  Hip joints knee joints ankles MTPs PIPs were in good range of motion with no synovitis.  She has generalized hyperalgesia and positive tender points.  CDAI Exam: No CDAI exam completed.    Investigation: March 30, 2017 C3-C4 normal, ESR 39, vitamin D 36, dsDNA negative No additional findings. CBC Latest Ref Rng & Units 06/29/2017 04/17/2017 03/30/2017  WBC 3.8 - 10.8 Thousand/uL 3.9 4.8 3.6(L)  Hemoglobin 11.7 - 15.5 g/dL 12.3 12.8 13.3  Hematocrit 35.0 - 45.0 % 37.6 40.8 40.1  Platelets 140 - 400 Thousand/uL 319 286 256   CMP Latest Ref Rng & Units 06/29/2017 04/17/2017 03/30/2017  Glucose 65 - 99 mg/dL 82 100(H) 86  BUN 7 - 25 mg/dL 10 15 11  Creatinine 0.50 - 1.05 mg/dL 0.66 0.67 0.63  Sodium 135 - 146 mmol/L 142 141 139  Potassium   3.5 - 5.3 mmol/L 4.0 3.4(L) 3.8  Chloride 98 - 110 mmol/L 104 104 102  CO2 20 - 32 mmol/L _0 Calcium 8.6 - 10.4 mg/dL 9.3 8.9 9.2  Total Protein 6.1 - 8.1 g/dL 7.3 - 7.6  Total Bilirubin 0.2 - 1.2 mg/dL 0.6 - 1.0  Alkaline Phos 38 - 126 U/L - - -  AST 10 - 35 U/L 14 - 16  ALT 6 - 29 U/L 9 - 13    Imaging: No results found.  Speciality Comments: No specialty comments available.    Procedures:  No procedures performed Allergies: Aspirin; Hydrocodone-acetaminophen; Ibuprofen; Imdur [isosorbide nitrate]; Meperidine hcl; Morphine; Oxycodone-acetaminophen; Procaine hcl; Toprol xl [metoprolol]; and Tramadol   Assessment / Plan:     Visit Diagnoses: Mixed connective tissue disease ([place]) - +ANA,+Sm,+RNP,+RF, arthritis.  Patient continues to have a lot of fatigue and complains of arthralgias and joint swelling.  I do not see any synovitis on examination today.  I will obtain AVISE labs.  High risk medication use - PLQ 200 mg BID, MTX 0.8 ml weekly, folic acid 2 mg. eye exam:  04/17/2017, no visual field performed.  Her labs have been stable.  We will continue monitor labs every 3 months.  Fibromyalgia-she continues to have some generalized pain and discomfort from fibromyalgia.  Chronic fatigue-fatigue is her major concern.  Primary insomnia-good sleep hygiene was discussed.  Trochanteric bursitis of both hips-tenderness on palpation over bilateral trochanteric bursa consistent with trochanteric bursitis.  We decided not to inject her because her blood pressure was elevated.  I have given her a handout on IT band exercises.  History of vitamin D deficiency-she is on vitamin D supplements.  History of stomach ulcers  History of asthma  History of hypertension  History of high cholesterol  History of gastroesophageal reflux (GERD)  Nosebleed -patient reports having frequent nosebleeds for the last 1 year.  Plan: Ambulatory referral to ENT    Orders: Orders Placed This Encounter  Procedures  . Ambulatory referral to ENT   No orders of the defined types were placed in this encounter.   Face-to-face time spent with patient was 30 minutes. Greater than 50% of time was spent in counseling and coordination of care.  Follow-Up Instructions: Return in about 5 months (around 12/15/2017) for MCTD, FMS.   Bo Merino, MD  Note - This record has been created using Editor, commissioning.  Chart creation errors have been sought, but may not always  have been located. Such creation errors do not reflect on  the standard of medical care.

## 2017-07-15 ENCOUNTER — Encounter: Payer: Self-pay | Admitting: Rheumatology

## 2017-07-15 ENCOUNTER — Ambulatory Visit: Payer: BC Managed Care – PPO | Admitting: Rheumatology

## 2017-07-15 VITALS — BP 126/95 | HR 75 | Resp 15 | Ht 65.0 in | Wt 209.0 lb

## 2017-07-15 DIAGNOSIS — M797 Fibromyalgia: Secondary | ICD-10-CM

## 2017-07-15 DIAGNOSIS — R040 Epistaxis: Secondary | ICD-10-CM

## 2017-07-15 DIAGNOSIS — M351 Other overlap syndromes: Secondary | ICD-10-CM

## 2017-07-15 DIAGNOSIS — M7061 Trochanteric bursitis, right hip: Secondary | ICD-10-CM | POA: Diagnosis not present

## 2017-07-15 DIAGNOSIS — Z8679 Personal history of other diseases of the circulatory system: Secondary | ICD-10-CM

## 2017-07-15 DIAGNOSIS — Z8639 Personal history of other endocrine, nutritional and metabolic disease: Secondary | ICD-10-CM | POA: Diagnosis not present

## 2017-07-15 DIAGNOSIS — Z79899 Other long term (current) drug therapy: Secondary | ICD-10-CM | POA: Diagnosis not present

## 2017-07-15 DIAGNOSIS — Z8711 Personal history of peptic ulcer disease: Secondary | ICD-10-CM

## 2017-07-15 DIAGNOSIS — Z8709 Personal history of other diseases of the respiratory system: Secondary | ICD-10-CM

## 2017-07-15 DIAGNOSIS — R5382 Chronic fatigue, unspecified: Secondary | ICD-10-CM | POA: Diagnosis not present

## 2017-07-15 DIAGNOSIS — F5101 Primary insomnia: Secondary | ICD-10-CM | POA: Diagnosis not present

## 2017-07-15 DIAGNOSIS — M7062 Trochanteric bursitis, left hip: Secondary | ICD-10-CM | POA: Diagnosis not present

## 2017-07-15 DIAGNOSIS — Z8719 Personal history of other diseases of the digestive system: Secondary | ICD-10-CM

## 2017-07-15 NOTE — Patient Instructions (Addendum)
Iliotibial Band Syndrome Rehab Ask your health care provider which exercises are safe for you. Do exercises exactly as told by your health care provider and adjust them as directed. It is normal to feel mild stretching, pulling, tightness, or discomfort as you do these exercises, but you should stop right away if you feel sudden pain or your pain gets worse.Do not begin these exercises until told by your health care provider. Stretching and range of motion exercises These exercises warm up your muscles and joints and improve the movement and flexibility of your hip and pelvis. Exercise A: Quadriceps, prone  1. Lie on your abdomen on a firm surface, such as a bed or padded floor. 2. Bend your left / right knee and hold your ankle. If you cannot reach your ankle or pant leg, loop a belt around your foot and grab the belt instead. 3. Gently pull your heel toward your buttocks. Your knee should not slide out to the side. You should feel a stretch in the front of your thigh and knee. 4. Hold this position for __________ seconds. Repeat __________ times. Complete this stretch __________ times a day. Exercise B: Iliotibial band  1. Lie on your side with your left / right leg in the top position. 2. Bend both of your knees and grab your left / right ankle. Stretch out your bottom arm to help you balance. 3. Slowly bring your top knee back so your thigh goes behind your trunk. 4. Slowly lower your top leg toward the floor until you feel a gentle stretch on the outside of your left / right hip and thigh. If you do not feel a stretch and your knee will not fall farther, place the heel of your other foot on top of your knee and pull your knee down toward the floor with your foot. 5. Hold this position for __________ seconds. Repeat __________ times. Complete this stretch __________ times a day. Strengthening exercises These exercises build strength and endurance in your hip and pelvis. Endurance is the  ability to use your muscles for a long time, even after they get tired. Exercise C: Straight leg raises ( hip abductors) 1. Lie on your side with your left / right leg in the top position. Lie so your head, shoulder, knee, and hip line up. You may bend your bottom knee to help you balance. 2. Roll your hips slightly forward so your hips are stacked directly over each other and your left / right knee is facing forward. 3. Tense the muscles in your outer thigh and lift your top leg 4-6 inches (10-15 cm). 4. Hold this position for __________ seconds. 5. Slowly return to the starting position. Let your muscles relax completely before doing another repetition. Repeat __________ times. Complete this exercise __________ times a day. Exercise D: Straight leg raises ( hip extensors) 1. Lie on your abdomen on your bed or a firm surface. You can put a pillow under your hips if that is more comfortable. 2. Bend your left / right knee so your foot is straight up in the air. 3. Squeeze your buttock muscles and lift your left / right thigh off the bed. Do not let your back arch. 4. Tense this muscle as hard as you can without increasing any knee pain. 5. Hold this position for __________ seconds. 6. Slowly lower your leg to the starting position and allow it to relax completely. Repeat __________ times. Complete this exercise __________ times a day. Exercise E: Hip   hike 1. Stand sideways on a bottom step. Stand on your left / right leg with your other foot unsupported next to the step. You can hold onto the railing or wall if needed for balance. 2. Keep your knees straight and your torso square. Then, lift your left / right hip up toward the ceiling. 3. Slowly let your left / right hip lower toward the floor, past the starting position. Your foot should get closer to the floor. Do not lean or bend your knees. Repeat __________ times. Complete this exercise __________ times a day. This information is not  intended to replace advice given to you by your health care provider. Make sure you discuss any questions you have with your health care provider. Document Released: 01/05/2005 Document Revised: 09/10/2015 Document Reviewed: 12/07/2014 Elsevier Interactive Patient Education  2018 [employer] We placed an order today for your standing lab work.    Please come back and get your standing labs in September and every 3 months  We have open lab Monday through Friday from 8:30-11:30 AM and 1:30-4:00 PM  at the office of Dr. Pollyann Savoy.   You may experience shorter wait times on Monday and Friday afternoons. The office is located at [address], [address], [place], [place] [address] No appointment is necessary.   Labs are drawn by [facility].  You may receive a bill from [facility] for your lab work. If you have any questions regarding directions or hours of operation,  please call [phone].

## 2017-07-20 ENCOUNTER — Telehealth: Payer: Self-pay | Admitting: Rheumatology

## 2017-07-20 MED ORDER — PREDNISONE 5 MG PO TABS: ORAL_TABLET | ORAL | 0 refills | Status: DC

## 2017-07-20 NOTE — Telephone Encounter (Signed)
Left message for patient to advise prescription for prednisone has been sent to pharmacy.

## 2017-07-20 NOTE — Telephone Encounter (Signed)
I discussed  this with Dr. Corliss Skains, and she is okay with sending a prednisone taper to the pharmacy starting at 20 mg and tapering by 5 mg every 2 days.

## 2017-07-20 NOTE — Telephone Encounter (Signed)
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  Patient states her fingers on her right hand are swollen and painful and she cannot bend her knees.  Patient is unable to sleep at night.  Patient is requesting a prescription of Prednisone to be sent to [facility] [address] in [place].

## 2017-08-01 ENCOUNTER — Other Ambulatory Visit: Payer: Self-pay | Admitting: Rheumatology

## 2017-08-02 NOTE — Telephone Encounter (Signed)
Last Visit: 07/15/17 Next Visit: 10/18/17 Labs: 06/29/17 WNL  Okay to refill per Dr. Corliss Skains

## 2017-08-13 ENCOUNTER — Encounter: Payer: Self-pay | Admitting: Family Medicine

## 2017-08-13 ENCOUNTER — Ambulatory Visit: Payer: BC Managed Care – PPO | Admitting: Family Medicine

## 2017-08-13 VITALS — BP 124/72 | HR 97 | Temp 98.2°F | Wt 206.0 lb

## 2017-08-13 DIAGNOSIS — F419 Anxiety disorder, unspecified: Secondary | ICD-10-CM

## 2017-08-13 DIAGNOSIS — R040 Epistaxis: Secondary | ICD-10-CM | POA: Diagnosis not present

## 2017-08-13 DIAGNOSIS — M351 Other overlap syndromes: Secondary | ICD-10-CM

## 2017-08-13 MED ORDER — PREDNISONE 10 MG PO TABS: ORAL_TABLET | ORAL | 0 refills | Status: DC

## 2017-08-13 NOTE — Progress Notes (Signed)
SOAP   Patricia Reed DOB: [birth year] Encounter date: 08/13/2017  This is a 57 y.o. female who presents with Chief Complaint  Patient presents with  . Generalized Body Aches    body aches, nose bleeds at least every other day, nausea and vomiting, patient states symptoms are related to anxiety/stress related Patient and her family were thrown out of their rent house     History of present illness:  Has a lot of stress right now. Just evicted; husband told her he may leave family to move into single bedroom place. Has less than 2 weeks to find a new place to live for herself and children. Financial struggles.   Feeling some post nasal bleeding; sometimes tasting it at night. Hasn't been contacted regarding seeing ENT specialist for nose bleeds. She was referred by rheumatologist last month. Getting nose bleeds about twice a week now; last big one lasted 30 minutes, had clotting. Usually from right side, but sometimes left. Body hurts - joints and muscles. This always flares when she is stressed. Headaches, mild frontal. These also happen when she is under stress.    Worried about flare up of her chronic conditions; usually steroids are helpful for her. Muscle relaxers make her too fatigued. She typically will get prednisone bursts from rheumatology when she has flare ups. She worries that pain comes back quickly after steroids are completed.  Feels that anxiety is really peaking right now. When this happens she tries to do some deep breathing, reading, and does rely on family and friends for support. Has very good support system through her church and large support of friends. She does have friends/family that will let her stay with them if she is unable to find housing before she has to leave current residence. She does tend to keep a positive attitude, but struggles when under this much stress.     Allergies  Allergen Reactions  . Aspirin Other (See Comments)    GI bleed  .  Hydrocodone-Acetaminophen Hives and Nausea And Vomiting  . Ibuprofen Other (See Comments)    GI bleed  . Imdur [Isosorbide Nitrate] Other (See Comments)    Reaction unknown  . Meperidine Hcl Hives and Nausea And Vomiting    Short term memory loss  . Morphine Hives and Nausea And Vomiting  . Oxycodone-Acetaminophen Hives and Nausea And Vomiting    Reaction unknown  . Procaine Hcl Other (See Comments)    Ineffective  . Toprol Xl [Metoprolol] Other (See Comments)    Reaction unknown  . Tramadol Itching   Current Meds  Medication Sig  . acetaminophen (TYLENOL) 650 MG CR tablet Take 650 mg by mouth 2 (two) times daily.   Marland Kitchen aspirin 81 MG tablet Take 81 mg by mouth daily.  . diclofenac sodium (VOLTAREN) 1 % GEL Apply 3 g to 3 large joints up to 3 times daily.  . fluticasone (FLONASE) 50 MCG/ACT nasal spray Place 2 sprays into both nostrils daily. (Patient taking differently: Place 2 sprays into both nostrils as needed. )  . folic acid (FOLVITE) 1 MG tablet Take 2 tablets (2 mg total) by mouth daily.  . hydroxychloroquine (PLAQUENIL) 200 MG tablet TAKE 200MG  BY MOUTH TWICE A DAY , MONDAY- FRIDAY ONLY.  . methotrexate 50 MG/2ML injection Inject 0.8 mLs (20 mg total) into the skin once a week.  . methotrexate 50 MG/2ML injection INJECT 0.8 MLS (20 MG TOTAL) INTO THE SKIN ONCE A WEEK.  . pantoprazole (PROTONIX) 40 MG tablet Take  1 tablet (40 mg total) by mouth daily.  . Tuberculin-Allergy Syringes (ALLERGY SYRINGE 1CC/27GX1/2") 27G X 1/2" 1 ML MISC Patient to use to inject MTX weekly  . valACYclovir (VALTREX) 500 MG tablet 1 tab bid for 3 days, start < 48 hours after onset.  . vitamin B-12 (CYANOCOBALAMIN) 100 MCG tablet Take 100 mcg by mouth daily.  Marland Kitchen VITAMIN D, ERGOCALCIFEROL, PO Take 1 capsule by mouth daily.  . [DISCONTINUED] predniSONE (DELTASONE) 5 MG tablet Take 4 tabs x4 days, 3 tabs x4 days, 2 tabs x4 days, 1 tab x4 days and then 1/2 x4 days.  . [DISCONTINUED] predniSONE (DELTASONE) 5  MG tablet Take 4 tabs x 2 days, 3 tabs x 2 days, 2 tabs x 2 days, 1 tab x 2 days    Review of Systems  Constitutional: Positive for fatigue (chronic). Negative for chills, fever and unexpected weight change.  HENT: Positive for congestion and nosebleeds.   Respiratory: Negative for cough, chest tightness, shortness of breath and wheezing.   Cardiovascular: Negative for chest pain, palpitations and leg swelling.  Gastrointestinal: Positive for abdominal pain (epigastric pain only when she feels stress. Once she relaxes her anxiety this is resolved.). Negative for blood in stool, constipation, diarrhea, nausea (no current nausea) and vomiting (she is not vomiting; when anxious she feels nauseous but this resolves when anxiety improves).  Musculoskeletal: Positive for arthralgias (chronic) and myalgias.  Skin: Negative for rash.  Neurological: Positive for headaches (with anxiety).  Psychiatric/Behavioral: The patient is nervous/anxious.     Objective:  BP 124/72 (BP Location: Left Arm, Patient Position: Sitting, Cuff Size: Normal)   Pulse 97   Temp 98.2 F (36.8 C) (Oral)   Wt 206 lb (93.4 kg)   SpO2 99%   BMI 34.28 kg/m   Weight: 206 lb (93.4 kg)   BP Readings from Last 3 Encounters:  08/13/17 124/72  07/15/17 (!) 126/95  05/26/17 121/82   Wt Readings from Last 3 Encounters:  08/13/17 206 lb (93.4 kg)  07/15/17 209 lb (94.8 kg)  05/26/17 210 lb (95.3 kg)    Physical Exam  Constitutional: She appears well-developed and well-nourished. No distress.  Cardiovascular: Normal rate, regular rhythm and normal heart sounds. Exam reveals no friction rub.  No murmur heard. Pulmonary/Chest: Effort normal and breath sounds normal. No respiratory distress. She has no wheezes. She has no rales.  Musculoskeletal:  No noted joint enlargement. She does have generalized tenderness on exam to muscles (specifically fibromyalgia points)  Psychiatric: She has a normal mood and affect. Her speech  is normal and behavior is normal. Judgment and thought content normal. Cognition and memory are normal.  Patricia Reed is frustrated with current situation and very concerned about what the upcoming weeks will hold for her. She is calm in the office and able to talk through her concerns logically. She states she does not currently feel anxious. She is worried about her family having to split up and whether everyone will be able to find a place to live.     Assessment/Plan  1. Mixed connective tissue disease (HCC) She is following with rheumatology. I have asked her to contact Dr. Corliss Skains for followup. I did give her a short prednisone burst due to her flare of muscular and joint discomfort, but we also talked about taking with the protonix due to her GI history.   2. Epistaxis, recurrent I printed referral information for her today. I have asked her to follow up with ENT office in order to  schedule appointment. I will check in with her to make sure she is able to get this completed.  3. Anxiety She has a good support system and feels comfortable monitoring her stress levels and reporting back to [place]. I have encouraged her to let us know if she feels that mood is worsening or she is not able to control her anxiety. She does prefer to limit medications if possible and has healthy coping techniques for stress at this time.       Return if symptoms worsen or fail to improve. We will check in with her next week to see how above issues are going and set up follow up if needed at that time.   Theodis Shove, MD

## 2017-08-14 DIAGNOSIS — M351 Other overlap syndromes: Secondary | ICD-10-CM | POA: Insufficient documentation

## 2017-08-17 ENCOUNTER — Other Ambulatory Visit: Payer: Self-pay | Admitting: *Deleted

## 2017-08-18 ENCOUNTER — Other Ambulatory Visit: Payer: Self-pay

## 2017-08-18 DIAGNOSIS — R040 Epistaxis: Secondary | ICD-10-CM

## 2017-08-18 NOTE — Progress Notes (Signed)
That all sounds good. Thank you for your follow up. Hopefully her joint and body pain is doing better as well; but I did advise her to follow up with rheum in case it is not.

## 2017-08-18 NOTE — Progress Notes (Signed)
Called patient to check in, she says that she has not had an ENT appointment. She was unsure if she was supposed to call and ENT office or if the office would call her. Patient Had a referral to ENT from Dr. Ermalinda Barrios on 07/15/17 but she had not heard anything. I advised the patient that our office would place a new referral for her. She says that her housing situation has worked out and she is looking into home owners options for the future.

## 2017-08-26 ENCOUNTER — Telehealth (INDEPENDENT_AMBULATORY_CARE_PROVIDER_SITE_OTHER): Payer: Self-pay

## 2017-08-26 NOTE — Telephone Encounter (Signed)
Velna Hatchet from [facility] called stating that they had received information for patient for a referral but did not get everything. She stated that they only received demographics and they actually needed referral, OV notes and demographics. Asked for it to be faxed to [phone]

## 2017-08-27 NOTE — Telephone Encounter (Signed)
Referral closed, patient went to a different ENT.

## 2017-09-04 ENCOUNTER — Other Ambulatory Visit: Payer: Self-pay | Admitting: Rheumatology

## 2017-09-10 ENCOUNTER — Telehealth: Payer: Self-pay | Admitting: Rheumatology

## 2017-09-10 NOTE — Telephone Encounter (Signed)
She may schedule an appointment for evaluation.  I cannot say that we can do for cortisone injections for her.  We will have to do assessment and check her blood pressure prior to making the decision.

## 2017-09-10 NOTE — Telephone Encounter (Signed)
Patient called stating she has been waking up in a lot of pain and with school starting wanted to check to see if Dr. Corliss Skains would recommend getting injections.  Patient states she has had injections in the past in her bilateral hips and on each side of her neck.  Patient states she gets all 4 shots at one appointment, but wasn't able to remember the name of the injections.  Patient is requesting a return call.

## 2017-09-10 NOTE — Telephone Encounter (Signed)
Spoke with patient and we may schedule an appointment for evaluation.  I cannot say that we can do for cortisone injections for her.  We will have to do assessment and check her blood pressure prior to making the decision. Patient has been scheduled for 8/28/ 19 at 2 pm

## 2017-09-10 NOTE — Progress Notes (Deleted)
Office Visit Note  [name]             Date of Birth: [birth year]           MRN: [id number]             PCP: [place], [name], MD Referring: [place], [name], MD Visit Date: 09/15/2017 Occupation: @GUAROCC @  Subjective:  No chief complaint on file.   History of Present Illness: Patricia Reed is a 57 y.o. female ***   Activities of Daily Living:  Patient reports morning stiffness for *** {minute/hour:19697}.   Patient {ACTIONS;DENIES/REPORTS:21021675::"Denies"} nocturnal pain.  Difficulty dressing/grooming: {ACTIONS;DENIES/REPORTS:21021675::"Denies"} Difficulty climbing stairs: {ACTIONS;DENIES/REPORTS:21021675::"Denies"} Difficulty getting out of chair: {ACTIONS;DENIES/REPORTS:21021675::"Denies"} Difficulty using hands for taps, buttons, cutlery, and/or writing: {ACTIONS;DENIES/REPORTS:21021675::"Denies"}  No Rheumatology ROS completed.   PMFS History:  Patient Active Problem List   Diagnosis Date Noted  . Mixed connective tissue disease (HCC) 08/14/2017  . Recurrent genital herpes 05/12/2017  . Shortness of breath 04/15/2017  . Dizziness 02/04/2017  . Situational anxiety 06/08/2016  . Atypical chest pain 06/08/2016  . Trapezius muscle spasm 04/23/2016  . Fibromyalgia 02/13/2016  . 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 Chronic fatigue 02/13/2016  . Vitamin D deficiency 11/25/2015  . Autoimmune disease (HCC) 11/23/2015  . High risk medication use 11/23/2015  . Colon cancer screening 01/25/2014  . Anterior neck pain 12/01/2013  . Inflammatory arthritis 08/18/2012  . Allergic rhinitis 06/23/2012  . HTN (hypertension) 06/23/2012  . High cholesterol 06/23/2012  . Thyroid nodule - benign 06/23/2012  . Arthralgia 06/23/2012  . Mild intermittent asthma   . Sickle cell trait (HCC)   . History of stomach ulcers   . ESOPHAGEAL STRICTURE 10/04/2007  . GERD 10/04/2007  . 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 Dysphagia, pharyngoesophageal phase 10/04/2007      Past Medical History:  Diagnosis Date  . Anemia   . Asthma   . GERD (gastroesophageal reflux disease)   . History of stomach ulcers   . Lupus (HCC)   . MI, old   . Rheumatoid arthritis (HCC)   . Thyroid mass   . Vertigo     Family History  Problem Relation Age of Onset  . Alcohol abuse Father   . Hypertension Father   . Heart disease Father   . Colon cancer Maternal Uncle   . Diabetes Maternal Grandmother   . Colon polyps Neg Hx   . Esophageal cancer Neg Hx   . Kidney disease Neg Hx   . Stomach cancer Neg Hx   . Rectal cancer Neg Hx    Past Surgical History:  Procedure Laterality Date  . ABDOMINAL HYSTERECTOMY     done for menorrhagia, ovaries remain  . CERVICAL DISCECTOMY     plates and screws in neck  . CESAREAN SECTION    . COLONOSCOPY    . ESOPHAGEAL MANOMETRY N/A 02/12/2014   Procedure: ESOPHAGEAL MANOMETRY (EM);  Surgeon: 02/14/2014, MD;  Location: [facility];  Service: Endoscopy;  Laterality: N/A;  . ESOPHAGOGASTRODUODENOSCOPY  03/04/2011   Procedure: ESOPHAGOGASTRODUODENOSCOPY (EGD);  Surgeon: 03/06/2011, MD;  Location: [facility];  Service: Endoscopy;  Laterality: N/A;  BOTOX Injection  . exc benign breast lump    . TONSILLECTOMY    . UPPER GASTROINTESTINAL ENDOSCOPY     Social History   Social History Narrative   [employer].  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      Objective: Vital Signs: There were no vitals taken  for this visit.   Physical Exam   Musculoskeletal Exam: ***  CDAI Exam: CDAI Score: Not documented Patient Global Assessment: Not documented; Provider Global Assessment: Not documented Swollen: Not documented; Tender: Not documented Joint Exam   Not documented   There is currently no information documented on the homunculus. Go to the Rheumatology activity and complete the homunculus joint exam.  Investigation: No additional findings.  Imaging: No results found.  Recent Labs: Lab  Results  Component Value Date   WBC 3.9 06/29/2017   HGB 12.3 06/29/2017   PLT 319 06/29/2017   NA 142 06/29/2017   K 4.0 06/29/2017   CL 104 06/29/2017   CO2 30 06/29/2017   GLUCOSE 82 06/29/2017   BUN 10 06/29/2017   CREATININE 0.66 06/29/2017   BILITOT 0.6 06/29/2017   ALKPHOS 65 12/30/2016   AST 14 06/29/2017   ALT 9 06/29/2017   PROT 7.3 06/29/2017   ALBUMIN 4.3 12/30/2016   CALCIUM 9.3 06/29/2017   GFRAA 115 06/29/2017    Speciality Comments: No specialty comments available.  Procedures:  No procedures performed Allergies: Aspirin; Hydrocodone-acetaminophen; Ibuprofen; Imdur [isosorbide nitrate]; Meperidine hcl; Morphine; Oxycodone-acetaminophen; Procaine hcl; Toprol xl [metoprolol]; and Tramadol   Assessment / Plan:     Visit Diagnoses: Mixed connective tissue disease (HCC) - +ANA,+Sm,+RNP,+RF, arthritis.   High risk medication use - PLQ 200 mg BID, MTX 0.8 ml weekly, folic acid 2 mg. eye exam: 7/89/3810, no visual field performed.   Fibromyalgia  Chronic fatigue  Primary insomnia  Trochanteric bursitis of both hips  History of vitamin D deficiency  History of stomach ulcers  History of asthma  History of hypertension  History of high cholesterol  History of gastroesophageal reflux (GERD)   Orders: No orders of the defined types were placed in this encounter.  No orders of the defined types were placed in this encounter.   Face-to-face time spent with patient was *** minutes. Greater than 50% of time was spent in counseling and coordination of care.  Follow-Up Instructions: No follow-ups on file.   Gearldine Bienenstock, PA-C  Note - This record has been created using Dragon software.  Chart creation errors have been sought, but may not always  have been located. Such creation errors do not reflect on  the standard of medical care. This encounter was created in error - please disregard.

## 2017-09-13 NOTE — Patient Instructions (Signed)
Standing Labs We placed an order today for your standing lab work.    Please come back and get your standing labs in September and then every 3 months.  We have open lab Monday through Friday from 8:30-11:30 AM and 1:30-4:00 PM  at the office of Dr. Shaili Deveshwar.   You may experience shorter wait times on Monday and Friday afternoons. The office is located at [address], [address], [place], [place] [address] No appointment is necessary.   Labs are drawn by [facility].  You may receive a bill from [facility] for your lab work. If you have any questions regarding directions or hours of operation,  please call [phone].    

## 2017-09-15 ENCOUNTER — Encounter: Payer: Self-pay | Admitting: Physician Assistant

## 2017-09-16 ENCOUNTER — Encounter

## 2017-09-16 ENCOUNTER — Ambulatory Visit: Payer: BC Managed Care – PPO | Admitting: Physician Assistant

## 2017-09-18 ENCOUNTER — Emergency Department (HOSPITAL_BASED_OUTPATIENT_CLINIC_OR_DEPARTMENT_OTHER)
Admission: EM | Admit: 2017-09-18 | Discharge: 2017-09-18 | Disposition: A | Discharge status: Home / Self Care | Payer: BC Managed Care – PPO | Attending: Emergency Medicine | Admitting: Emergency Medicine

## 2017-09-18 ENCOUNTER — Encounter (HOSPITAL_BASED_OUTPATIENT_CLINIC_OR_DEPARTMENT_OTHER): Payer: Self-pay | Admitting: Emergency Medicine

## 2017-09-18 ENCOUNTER — Other Ambulatory Visit: Payer: Self-pay

## 2017-09-18 DIAGNOSIS — J45909 Unspecified asthma, uncomplicated: Secondary | ICD-10-CM | POA: Diagnosis not present

## 2017-09-18 DIAGNOSIS — D573 Sickle-cell trait: Secondary | ICD-10-CM | POA: Diagnosis not present

## 2017-09-18 DIAGNOSIS — I252 Old myocardial infarction: Secondary | ICD-10-CM | POA: Diagnosis not present

## 2017-09-18 DIAGNOSIS — M130 Polyarthritis, unspecified: Secondary | ICD-10-CM | POA: Diagnosis not present

## 2017-09-18 DIAGNOSIS — R531 Weakness: Secondary | ICD-10-CM | POA: Diagnosis present

## 2017-09-18 DIAGNOSIS — Z7982 Long term (current) use of aspirin: Secondary | ICD-10-CM | POA: Insufficient documentation

## 2017-09-18 DIAGNOSIS — Z79899 Other long term (current) drug therapy: Secondary | ICD-10-CM | POA: Insufficient documentation

## 2017-09-18 DIAGNOSIS — I10 Essential (primary) hypertension: Secondary | ICD-10-CM | POA: Insufficient documentation

## 2017-09-18 DIAGNOSIS — M2550 Pain in unspecified joint: Secondary | ICD-10-CM

## 2017-09-18 DIAGNOSIS — M797 Fibromyalgia: Secondary | ICD-10-CM | POA: Diagnosis not present

## 2017-09-18 HISTORY — DX: Pure hypercholesterolemia, unspecified: E78.00

## 2017-09-18 HISTORY — DX: Essential (primary) hypertension: I10

## 2017-09-18 HISTORY — DX: Fibromyalgia: M79.7

## 2017-09-18 LAB — COMPREHENSIVE METABOLIC PANEL
ALT: 13 U/L (ref 0–44)
AST: 19 U/L (ref 15–41)
Albumin: 4 g/dL (ref 3.5–5.0)
Alkaline Phosphatase: 58 U/L (ref 38–126)
Anion gap: 9 (ref 5–15)
BUN: 12 mg/dL (ref 6–20)
CO2: 25 mmol/L (ref 22–32)
Calcium: 9.2 mg/dL (ref 8.9–10.3)
Chloride: 106 mmol/L (ref 98–111)
Creatinine, Ser: 0.67 mg/dL (ref 0.44–1.00)
GFR calc Af Amer: >60 mL/min (ref >60)
GFR calc non Af Amer: >60 mL/min (ref >60)
Glucose, Bld: 90 mg/dL (ref 70–99)
Potassium: 4.1 mmol/L (ref 3.5–5.1)
Sodium: 140 mmol/L (ref 135–145)
Total Bilirubin: 0.9 mg/dL (ref 0.3–1.2)
Total Protein: 8 g/dL (ref 6.5–8.1)

## 2017-09-18 LAB — CBC WITH DIFFERENTIAL/PLATELET
Basophils Absolute: 0 10*3/uL (ref 0.0–0.1)
Basophils Relative: 1 %
Eosinophils Absolute: 0.1 10*3/uL (ref 0.0–0.7)
Eosinophils Relative: 3 %
HCT: 40.8 % (ref 36.0–46.0)
Hemoglobin: 12.8 g/dL (ref 12.0–15.0)
Lymphocytes Relative: 26 %
Lymphs Abs: 0.8 10*3/uL (ref 0.7–4.0)
MCH: 27.9 pg (ref 26.0–34.0)
MCHC: 31.4 g/dL (ref 30.0–36.0)
MCV: 89.1 fL (ref 78.0–100.0)
Monocytes Absolute: 0.3 10*3/uL (ref 0.1–1.0)
Monocytes Relative: 9 %
Neutro Abs: 1.9 10*3/uL (ref 1.7–7.7)
Neutrophils Relative %: 61 %
Platelets: 284 10*3/uL (ref 150–400)
RBC: 4.58 MIL/uL (ref 3.87–5.11)
RDW: 14.9 % (ref 11.5–15.5)
WBC: 3.1 10*3/uL — ABNORMAL LOW (ref 4.0–10.5)

## 2017-09-18 MED ORDER — CYCLOBENZAPRINE HCL 10 MG PO TABS: 10.0000 mg | ORAL_TABLET | Freq: Two times a day (BID) | ORAL | 0 refills | Status: DC | PRN

## 2017-09-18 MED ORDER — PREDNISONE 10 MG (21) PO TBPK: ORAL_TABLET | ORAL | 0 refills | Status: DC

## 2017-09-18 MED ORDER — KETOROLAC TROMETHAMINE 30 MG/ML IJ SOLN
30.0000 mg | Freq: Once | INTRAMUSCULAR | Status: AC
  Administered 2017-09-18: 30 mg via INTRAVENOUS
  Filled 2017-09-18: qty 1

## 2017-09-18 NOTE — ED Triage Notes (Signed)
Pt c/o "lupus flare".... Joint pain and fatigue x 2 weeks. Taking tylenol arthritis and CBD oil without relief.

## 2017-09-18 NOTE — ED Provider Notes (Signed)
[facility] EMERGENCY DEPARTMENT Provider Note   CSN: [id number] Arrival date & time: 09/18/17  1191     History   Chief Complaint Chief Complaint  Patient presents with  . Fatigue    HPI Patricia Reed is a 57 y.o. female with history of fibromyalgia, lupus, rheumatoid arthritis who presents with a 3-week history of generalized weakness and polyarthralgias.  Patient describes her pain is worse with touch everywhere.  She denies any abdominal pain, nausea, vomiting.  She reports she has some chest pain with deep breathing, however she states this is normal for her to flares.  She normally has prednisone taper and Flexeril with for her flares.  She takes Plaquenil and methotrexate.  She is an appointment with the lupus clinic at [facility] in 2 weeks.  She denies any numbness or tingling, urinary symptoms.  She has been taking Tylenol for arthritis at home with some relief.  She denies any recent long trips, surgeries, new leg pain or swelling, history of blood clots.  HPI  Past Medical History:  Diagnosis Date  . Anemia   . Asthma   . Fibromyalgia   . GERD (gastroesophageal reflux disease)   . High cholesterol   . History of stomach ulcers   . Hypertension   . Lupus (HCC)   . MI, old   . Rheumatoid arthritis (HCC)   . Thyroid mass   . Vertigo     Patient Active Problem List   Diagnosis Date Noted  . Mixed connective tissue disease (HCC) 08/14/2017  . Recurrent genital herpes 05/12/2017  . Shortness of breath 04/15/2017  . Dizziness 02/04/2017  . Situational anxiety 06/08/2016  . Atypical chest pain 06/08/2016  . Trapezius muscle spasm 04/23/2016  . Fibromyalgia 02/13/2016  . 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 Chronic fatigue 02/13/2016  . Vitamin D deficiency 11/25/2015  . Autoimmune disease (HCC) 11/23/2015  . High risk medication use 11/23/2015  . Colon cancer screening 01/25/2014  . Anterior neck pain 12/01/2013  . Inflammatory arthritis 08/18/2012  .  Allergic rhinitis 06/23/2012  . HTN (hypertension) 06/23/2012  . High cholesterol 06/23/2012  . Thyroid nodule - benign 06/23/2012  . Arthralgia 06/23/2012  . Mild intermittent asthma   . Sickle cell trait (HCC)   . History of stomach ulcers   . ESOPHAGEAL STRICTURE 10/04/2007  . GERD 10/04/2007  . 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 Dysphagia, pharyngoesophageal phase 10/04/2007    Past Surgical History:  Procedure Laterality Date  . ABDOMINAL HYSTERECTOMY     done for menorrhagia, ovaries remain  . CERVICAL DISCECTOMY     plates and screws in neck  . CESAREAN SECTION    . COLONOSCOPY    . ESOPHAGEAL MANOMETRY N/A 02/12/2014   Procedure: ESOPHAGEAL MANOMETRY (EM);  Surgeon: Louis Meckel, MD;  Location: [facility];  Service: Endoscopy;  Laterality: N/A;  . ESOPHAGOGASTRODUODENOSCOPY  03/04/2011   Procedure: ESOPHAGOGASTRODUODENOSCOPY (EGD);  Surgeon: Louis Meckel, MD;  Location: [facility];  Service: Endoscopy;  Laterality: N/A;  BOTOX Injection  . exc benign breast lump    . TONSILLECTOMY    . UPPER GASTROINTESTINAL ENDOSCOPY       OB History    Gravida  5   Para  3   Term  3   Preterm      AB      Living        SAB      TAB      Ectopic  Multiple      Live Births               Home Medications    Prior to Admission medications   Medication Sig Start Date End Date Taking? Authorizing Provider  acetaminophen (TYLENOL) 650 MG CR tablet Take 650 mg by mouth 2 (two) times daily.     [provider]  aspirin 81 MG tablet Take 81 mg by mouth daily.    [provider]  cyclobenzaprine (FLEXERIL) 10 MG tablet Take 1 tablet (10 mg total) by mouth 2 (two) times daily as needed for muscle spasms. 09/18/17   Emi Holes, PA-C  diclofenac sodium (VOLTAREN) 1 % GEL Apply 3 g to 3 large joints up to 3 times daily. 05/11/17   Gearldine Bienenstock, PA-C  fluticasone (FLONASE) 50 MCG/ACT nasal spray Place 2 sprays into both nostrils  daily. Patient taking differently: Place 2 sprays into both nostrils as needed.  02/22/17   Nafziger, Kandee Keen, NP  folic acid (FOLVITE) 1 MG tablet Take 2 tablets (2 mg total) by mouth daily. 07/09/17   Pollyann Savoy, MD  hydroxychloroquine (PLAQUENIL) 200 MG tablet TAKE 200MG  BY MOUTH TWICE A DAY , MONDAY- FRIDAY ONLY. 06/30/17   08/30/17, MD  methotrexate 50 MG/2ML injection Inject 0.8 mLs (20 mg total) into the skin once a week. 08/28/16   Panwala, Naitik, PA-C  methotrexate 50 MG/2ML injection INJECT 0.8 MLS (20 MG TOTAL) INTO THE SKIN ONCE A WEEK. 08/02/17   08/04/17, MD  pantoprazole (PROTONIX) 40 MG tablet Take 1 tablet (40 mg total) by mouth daily. 04/13/17   04/15/17, [name], MD  predniSONE (STERAPRED UNI-PAK 21 TAB) 10 MG (21) TBPK tablet Take 6 tabs by mouth for 2 days, then 5 tabs for 2 days, then 4 tabs for 2 days, then 3 tabs for 2 days, 2 tabs for 2 days, 1 tab for 2 days 09/18/17   09/20/17, PA-C  Tuberculin-Allergy Syringes (ALLERGY SYRINGE 1CC/27GX1/2") 27G X 1/2" 1 ML MISC Patient to use to inject MTX weekly 08/21/16   10/21/16, MD  valACYclovir (VALTREX) 500 MG tablet 1 tab bid for 3 days, start < 48 hours after onset. 05/12/17   05/14/17, [name], MD  vitamin B-12 (CYANOCOBALAMIN) 100 MCG tablet Take 100 mcg by mouth daily.    [provider]  VITAMIN D, ERGOCALCIFEROL, PO Take 1 capsule by mouth daily.    [provider]    Family History Family History  Problem Relation Age of Onset  . Alcohol abuse Father   . Hypertension Father   . Heart disease Father   . Colon cancer Maternal Uncle   . Diabetes Maternal Grandmother   . Colon polyps Neg Hx   . Esophageal cancer Neg Hx   . Kidney disease Neg Hx   . Stomach cancer Neg Hx   . Rectal cancer Neg Hx     Social History Social History   Tobacco Use  . Smoking status: Never Smoker  . Smokeless tobacco: Never Used  Substance Use Topics  . Alcohol use: Yes    Alcohol/week:  1.0 standard drinks    Types: 1 Glasses of wine per week    Comment: occ  . Drug use: No     Allergies   Aspirin; Hydrocodone-acetaminophen; Ibuprofen; Imdur [isosorbide nitrate]; Meperidine hcl; Morphine; Oxycodone-acetaminophen; Procaine hcl; Toprol xl [metoprolol]; and Tramadol   Review of Systems Review of Systems  Constitutional: Positive for fatigue. Negative  for chills and fever.  HENT: Negative for facial swelling and sore throat.   Respiratory: Negative for cough and shortness of breath.   Cardiovascular: Positive for chest pain.  Gastrointestinal: Negative for abdominal pain, nausea and vomiting.  Genitourinary: Negative for dysuria.  Musculoskeletal: Positive for arthralgias. Negative for back pain.  Skin: Negative for rash and wound.  Neurological: Negative for headaches.  Psychiatric/Behavioral: The patient is not nervous/anxious.      Physical Exam Updated Vital Signs BP (!) 146/109   Pulse 66   Temp 98.1 F (36.7 C) (Oral)   Resp 14   Ht 5' (1.524 m)   Wt 93.9 kg   SpO2 96%   BMI 40.43 kg/m   Physical Exam  Constitutional: She appears well-developed and well-nourished. No distress.  HENT:  Head: Normocephalic and atraumatic.  Mouth/Throat: Oropharynx is clear and moist. No oropharyngeal exudate.  Eyes: Pupils are equal, round, and reactive to light. Conjunctivae are normal. Right eye exhibits no discharge. Left eye exhibits no discharge. No scleral icterus.  Neck: Normal range of motion. Neck supple. No thyromegaly present.  Cardiovascular: Normal rate, regular rhythm, normal heart sounds and intact distal pulses. Exam reveals no gallop and no friction rub.  No murmur heard. Pulmonary/Chest: Effort normal and breath sounds normal. No stridor. No respiratory distress. She has no wheezes. She has no rales. She exhibits tenderness.  Abdominal: Soft. Bowel sounds are normal. She exhibits no distension. There is no tenderness. There is no rebound and no  guarding.  Musculoskeletal: She exhibits no edema.  Tenderness on palpation of all joints, however full range of motion  Lymphadenopathy:    She has no cervical adenopathy.  Neurological: She is alert. Coordination normal.  Skin: Skin is warm and dry. No rash noted. She is not diaphoretic. No pallor.  Psychiatric: She has a normal mood and affect.  Nursing note and vitals reviewed.    ED Treatments / Results  Labs (all labs ordered are listed, but only abnormal results are displayed) Labs Reviewed  CBC WITH DIFFERENTIAL/PLATELET - Abnormal; Notable for the following components:      Result Value   WBC 3.1 (*)    All other components within normal limits  COMPREHENSIVE METABOLIC PANEL    EKG EKG Interpretation  Date/Time:  Saturday September 18 2017 10:56:44 EDT Ventricular Rate:  71 PR Interval:    QRS Duration: 101 QT Interval:  435 QTC Calculation: 473 R Axis:   22 Text Interpretation:  Sinus rhythm No significant change since last tracing Confirmed by Linwood Dibbles [phone]) on 09/18/2017 10:59:33 AM Also confirmed by Linwood Dibbles [phone]), editor Sheppard Evens ([id number])  on 09/18/2017 11:46:16 AM   Radiology No results found.  Procedures Procedures (including critical care time)  Medications Ordered in ED Medications  ketorolac (TORADOL) 30 MG/ML injection 30 mg (30 mg Intravenous Given 09/18/17 1155)     Initial Impression / Assessment and Plan / ED Course  I have reviewed the triage vital signs and the nursing notes.  Pertinent labs & imaging results that were available during my care of the patient were reviewed by me and considered in my medical decision making (see chart for details).     Patient presenting with symptoms consistent with her normal lupus and fibromyalgia flares.  Her labs are unremarkable.  Her EKG shows NSR and no significant change since last tracing.  Low suspicion of PE to explain patient's chest pain, she states she has this every time she has  been flare.  Suspect chest wall pain.  Patient is more concerned about her joint pain.  Patient given Toradol in the ED.  Will discharge home with prednisone and Flexeril with follow-up to PCP and as scheduled with the lupus clinic.  Return precautions discussed.  Patient understands and agrees with plan.  Patient vitals stable throughout ED course and discharged in satisfactory condition. I discussed patient case with Dr. Lynelle Doctor who guided the patient's management and agrees with plan.   Final Clinical Impressions(s) / ED Diagnoses   Final diagnoses:  Generalized weakness  Polyarthralgia  Fibromyalgia    ED Discharge Orders         Ordered    predniSONE (STERAPRED UNI-PAK 21 TAB) 10 MG (21) TBPK tablet     09/18/17 1151    cyclobenzaprine (FLEXERIL) 10 MG tablet  2 times daily PRN     09/18/17 1151           Brentt Fread, [place], PA-C 09/18/17 1259    Linwood Dibbles, MD 09/19/17 [phone]

## 2017-09-18 NOTE — Discharge Instructions (Signed)
Take prednisone until completed.  Take Flexeril twice daily as needed for muscle pain or spasms.  Follow-up with your doctor as planned.  Please return the emergency department if you develop any new or worsening symptoms.

## 2017-10-02 ENCOUNTER — Other Ambulatory Visit: Payer: Self-pay | Admitting: Family Medicine

## 2017-10-02 DIAGNOSIS — R1013 Epigastric pain: Secondary | ICD-10-CM

## 2017-10-02 DIAGNOSIS — K2940 Chronic atrophic gastritis without bleeding: Secondary | ICD-10-CM

## 2017-10-11 ENCOUNTER — Other Ambulatory Visit: Payer: Self-pay

## 2017-10-11 DIAGNOSIS — Z79899 Other long term (current) drug therapy: Secondary | ICD-10-CM

## 2017-10-12 LAB — CBC WITH DIFFERENTIAL/PLATELET
BASOS PCT: 0.5 %
Basophils Absolute: 19 cells/uL (ref 0–200)
EOS PCT: 1.6 %
Eosinophils Absolute: 59 cells/uL (ref 15–500)
HCT: 37.2 % (ref 35.0–45.0)
HEMOGLOBIN: 12 g/dL (ref 11.7–15.5)
Lymphs Abs: 962 cells/uL (ref 850–3900)
MCH: 27.3 pg (ref 27.0–33.0)
MCHC: 32.3 g/dL (ref 32.0–36.0)
MCV: 84.7 fL (ref 80.0–100.0)
MONOS PCT: 5.7 %
MPV: 10.8 fL (ref 7.5–12.5)
NEUTROS ABS: 2449 {cells}/uL (ref 1500–7800)
Neutrophils Relative %: 66.2 %
PLATELETS: 294 10*3/uL (ref 140–400)
RBC: 4.39 10*6/uL (ref 3.80–5.10)
RDW: 13.6 % (ref 11.0–15.0)
Total Lymphocyte: 26 %
WBC mixed population: 211 cells/uL (ref 200–950)
WBC: 3.7 10*3/uL — ABNORMAL LOW (ref 3.8–10.8)

## 2017-10-12 LAB — COMPLETE METABOLIC PANEL WITH GFR
AG Ratio: 1.4 (calc) (ref 1.0–2.5)
ALBUMIN MSPROF: 4.3 g/dL (ref 3.6–5.1)
ALKALINE PHOSPHATASE (APISO): 60 U/L (ref 33–130)
ALT: 11 U/L (ref 6–29)
AST: 18 U/L (ref 10–35)
BILIRUBIN TOTAL: 0.7 mg/dL (ref 0.2–1.2)
BUN: 14 mg/dL (ref 7–25)
CHLORIDE: 106 mmol/L (ref 98–110)
CO2: 26 mmol/L (ref 20–32)
Calcium: 9.5 mg/dL (ref 8.6–10.4)
Creat: 0.78 mg/dL (ref 0.50–1.05)
GFR, EST AFRICAN AMERICAN: 99 mL/min/{1.73_m2} (ref >=60)
GFR, Est Non African American: 86 mL/min/{1.73_m2} (ref >=60)
Globulin: 3.1 g/dL (calc) (ref 1.9–3.7)
Glucose, Bld: 98 mg/dL (ref 65–99)
Potassium: 3.7 mmol/L (ref 3.5–5.3)
SODIUM: 141 mmol/L (ref 135–146)
TOTAL PROTEIN: 7.4 g/dL (ref 6.1–8.1)

## 2017-10-12 NOTE — Progress Notes (Signed)
stable °

## 2017-11-05 ENCOUNTER — Telehealth: Payer: Self-pay | Admitting: *Deleted

## 2017-11-05 NOTE — Telephone Encounter (Signed)
Patient has been scheduled for 11/16/17 to discuss AVISE labs.

## 2017-11-05 NOTE — Telephone Encounter (Signed)
Attempted to contact patient to schedule follow up appointment to discuss AVISE labs. No answer and unable to leave a message.

## 2017-11-08 ENCOUNTER — Telehealth: Payer: Self-pay | Admitting: Rheumatology

## 2017-11-08 NOTE — Telephone Encounter (Signed)
Spoke with patient and scheduled her an appointment to discuss AVISE labs.

## 2017-11-08 NOTE — Progress Notes (Signed)
Office Visit Note  [name]             Date of Birth: 08-23-1960           MRN: [id number]             PCP: [place], [name], MD Referring: [place], [name], MD Visit Date: 11/16/2017 Occupation: @GUAROCC @  Subjective:  Pain in both hands.   History of Present Illness: Patricia Reed is a 57 y.o. female with history of mixed connective tissue disease.  Patient states she was seen at [facility] last month by Dr. Verdon Cummins.  He did ultrasound of her hands and recommended Xeljanz for ongoing inflammation.  Patient states that she was placed on prednisone taper.  She is currently on prednisone 4 mg p.o. daily.  Patient states that she has flare each time when she gets off the prednisone.  They have applied for Xeljanz.  She is awaiting to hear from them about restarting Xeljanz.  Activities of Daily Living:  Patient reports morning stiffness for 45 minutes.   Patient Denies nocturnal pain.  Difficulty dressing/grooming: Denies Difficulty climbing stairs: Reports Difficulty getting out of chair: Reports Difficulty using hands for taps, buttons, cutlery, and/or writing: Reports  Review of Systems  Constitutional: Positive for fatigue. Negative for night sweats, weight gain and weight loss.  HENT: Negative for mouth sores, trouble swallowing, trouble swallowing, mouth dryness and nose dryness.   Eyes: Negative for pain, redness, visual disturbance and dryness.  Respiratory: Negative for cough, shortness of breath and difficulty breathing.   Cardiovascular: Negative for chest pain, palpitations, hypertension, irregular heartbeat and swelling in legs/feet.  Gastrointestinal: Positive for constipation and diarrhea. Negative for blood in stool.  Endocrine: Negative for increased urination.  Genitourinary: Negative for vaginal dryness.  Musculoskeletal: Positive for arthralgias, joint pain, joint swelling and morning stiffness. Negative for myalgias, muscle weakness, muscle  tenderness and myalgias.  Skin: Negative for color change, rash, hair loss, skin tightness, ulcers and sensitivity to sunlight.  Allergic/Immunologic: Negative for susceptible to infections.  Neurological: Negative for dizziness, memory loss, night sweats and weakness.  Hematological: Negative for swollen glands.  Psychiatric/Behavioral: Positive for sleep disturbance. Negative for depressed mood. The patient is not nervous/anxious.     PMFS History:  Patient Active Problem List   Diagnosis Date Noted  . Mixed connective tissue disease (HCC) 08/14/2017  . Recurrent genital herpes 05/12/2017  . Shortness of breath 04/15/2017  . Dizziness 02/04/2017  . Situational anxiety 06/08/2016  . Atypical chest pain 06/08/2016  . Trapezius muscle spasm 04/23/2016  . Fibromyalgia 02/13/2016  . 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 Chronic fatigue 02/13/2016  . Vitamin D deficiency 11/25/2015  . Autoimmune disease (HCC) 11/23/2015  . High risk medication use 11/23/2015  . Colon cancer screening 01/25/2014  . Anterior neck pain 12/01/2013  . Inflammatory arthritis 08/18/2012  . Allergic rhinitis 06/23/2012  . HTN (hypertension) 06/23/2012  . High cholesterol 06/23/2012  . Thyroid nodule - benign 06/23/2012  . Arthralgia 06/23/2012  . Mild intermittent asthma   . Sickle cell trait (HCC)   . History of stomach ulcers   . ESOPHAGEAL STRICTURE 10/04/2007  . GERD 10/04/2007  . 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 Dysphagia, pharyngoesophageal phase 10/04/2007    Past Medical History:  Diagnosis Date  . Anemia   . Asthma   . Fibromyalgia   . GERD (gastroesophageal reflux disease)   . High cholesterol   . History of stomach ulcers   . Hypertension   .  Lupus (HCC)   . MI, old   . Rheumatoid arthritis (HCC)   . Thyroid mass   . Vertigo     Family History  Problem Relation Age of Onset  . Alcohol abuse Father   . Hypertension Father   . Heart disease Father   . Colon cancer Maternal Uncle   .  Diabetes Maternal Grandmother   . Colon polyps Neg Hx   . Esophageal cancer Neg Hx   . Kidney disease Neg Hx   . Stomach cancer Neg Hx   . Rectal cancer Neg Hx    Past Surgical History:  Procedure Laterality Date  . ABDOMINAL HYSTERECTOMY     done for menorrhagia, ovaries remain  . CERVICAL DISCECTOMY     plates and screws in neck  . CESAREAN SECTION    . COLONOSCOPY    . ESOPHAGEAL MANOMETRY N/A 02/12/2014   Procedure: ESOPHAGEAL MANOMETRY (EM);  Surgeon: Louis Meckel, MD;  Location: [facility];  Service: Endoscopy;  Laterality: N/A;  . ESOPHAGOGASTRODUODENOSCOPY  03/04/2011   Procedure: ESOPHAGOGASTRODUODENOSCOPY (EGD);  Surgeon: Louis Meckel, MD;  Location: [facility];  Service: Endoscopy;  Laterality: N/A;  BOTOX Injection  . exc benign breast lump    . TONSILLECTOMY    . 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      Objective: Vital Signs: BP 140/90 (BP Location: Left Arm, Patient Position: Sitting, Cuff Size: Normal)   Pulse 74   Resp 14   Ht 5\' 5"  (1.651 m)   Wt 211 lb (95.7 kg)   BMI 35.11 kg/m    Physical Exam  Constitutional: She is oriented to person, place, and time. She appears well-developed and well-nourished.  HENT:  Head: Normocephalic and atraumatic.  Eyes: Conjunctivae and EOM are normal.  Neck: Normal range of motion.  Cardiovascular: Normal rate, regular rhythm, normal heart sounds and intact distal pulses.  Pulmonary/Chest: Effort normal and breath sounds normal.  Abdominal: Soft. Bowel sounds are normal.  Lymphadenopathy:    She has no cervical adenopathy.  Neurological: She is alert and oriented to person, place, and time.  Skin: Skin is warm and dry. Capillary refill takes less than 2 seconds.  Psychiatric: She has a normal mood and affect. Her behavior is normal.  Nursing note and vitals reviewed.    Musculoskeletal  Exam: C-spine thoracic lumbar spine good range of motion.  Shoulder joints elbow joints wrist joint MCPs PIPs DIPs were in good range of motion with no synovitis.  Hip joints knee joints ankles MTPs PIPs DIPs were in good range of motion with no synovitis.  CDAI Exam: CDAI Score: 0.2  Patient Global Assessment: 1 (mm); Provider Global Assessment: 1 (mm) Swollen: 0 ; Tender: 0  Joint Exam   Not documented   There is currently no information documented on the homunculus. Go to the Rheumatology activity and complete the homunculus joint exam.  Investigation: No additional findings.  Imaging: No results found.  Recent Labs: Lab Results  Component Value Date   WBC 3.7 (L) 10/11/2017   HGB 12.0 10/11/2017   PLT 294 10/11/2017   NA 141 10/11/2017   K 3.7 10/11/2017   CL 106 10/11/2017   CO2 26 10/11/2017   GLUCOSE 98 10/11/2017   BUN 14 10/11/2017   CREATININE 0.78 10/11/2017   BILITOT 0.7 10/11/2017   ALKPHOS 58 09/18/2017  AST 18 10/11/2017   ALT 11 10/11/2017   PROT 7.4 10/11/2017   ALBUMIN 4.0 09/18/2017   CALCIUM 9.5 10/11/2017   GFRAA 99 10/11/2017    Speciality Comments: No specialty comments available.  Procedures:  No procedures performed Allergies: Aspirin; Hydrocodone-acetaminophen; Ibuprofen; Imdur [isosorbide nitrate]; Meperidine hcl; Morphine; Oxycodone-acetaminophen; Procaine hcl; Toprol xl [metoprolol]; and Tramadol   Assessment / Plan:     Visit Diagnoses: Mixed connective tissue disease (HCC) - +ANA,+Sm,+RNP,+RF, arthritis.AVISE labs were discussed with patient.  She has positive ANA, positive RNP and positive RF.  I reviewed her records from [place].  Office visit notes and labs were reviewed and discussed with patient.  Rheumatoid arthritis involving multiple sites with positive rheumatoid factor (HCC)-patient was evaluated at [facility] by Dr. Verdon Cummins.  During the visit patient had synovitis and also ultrasound revealed effusions in her MCPs with synovitis.  He  diagnosed her with rheumatoid arthritis and mixed connective tissue disease overlap and applied for Xeljanz.  She is awaiting to start on Xeljanz at this point.  She had no synovitis on examination today.  She is taking prednisone taper.  High risk medication use -  MTX 0.8 ml sq q wk, folic acid 2mg  po qd, PLQ on hold eye exam: 04/17/2017, no visual field performed.  I have advised her to get eye exam as soon as possible so we can resume Plaquenil.  Fibromyalgia-she continues to have generalized pain and discomfort.  Primary insomnia-good sleep hygiene was discussed.  Chronic fatigue-secondary to insomnia.   Other medical problems are listed as follows: History of vitamin D deficiency  Nosebleed - patient reports having frequent nosebleeds for the last 1 year.   History of gastroesophageal reflux (GERD)  History of hypertension  History of asthma  History of high cholesterol  History of stomach ulcers   Orders: No orders of the defined types were placed in this encounter.  No orders of the defined types were placed in this encounter.   Face-to-face time spent with patient was 35 minutes. Greater than 50% of time was spent in counseling and coordination of care.  Follow-Up Instructions: Return in about 5 months (around 04/17/2018) for MCTD, RA, FMS.  She will continue to follow-up with you can come here intermittently.   04/19/2018, MD  Note - This record has been created using Pollyann Savoy.  Chart creation errors have been sought, but may not always  have been located. Such creation errors do not reflect on  the standard of medical care.

## 2017-11-08 NOTE — Telephone Encounter (Signed)
Patient left a message 11/05/17 at 4:41pm. Returning a call from someone here.

## 2017-11-16 ENCOUNTER — Encounter: Payer: Self-pay | Admitting: Physician Assistant

## 2017-11-16 ENCOUNTER — Ambulatory Visit (INDEPENDENT_AMBULATORY_CARE_PROVIDER_SITE_OTHER): Payer: BC Managed Care – PPO | Admitting: Rheumatology

## 2017-11-16 VITALS — BP 140/90 | HR 74 | Resp 14 | Ht 65.0 in | Wt 211.0 lb

## 2017-11-16 DIAGNOSIS — Z79899 Other long term (current) drug therapy: Secondary | ICD-10-CM

## 2017-11-16 DIAGNOSIS — Z8719 Personal history of other diseases of the digestive system: Secondary | ICD-10-CM

## 2017-11-16 DIAGNOSIS — Z8639 Personal history of other endocrine, nutritional and metabolic disease: Secondary | ICD-10-CM

## 2017-11-16 DIAGNOSIS — Z8711 Personal history of peptic ulcer disease: Secondary | ICD-10-CM

## 2017-11-16 DIAGNOSIS — R5382 Chronic fatigue, unspecified: Secondary | ICD-10-CM

## 2017-11-16 DIAGNOSIS — M351 Other overlap syndromes: Secondary | ICD-10-CM

## 2017-11-16 DIAGNOSIS — Z8709 Personal history of other diseases of the respiratory system: Secondary | ICD-10-CM

## 2017-11-16 DIAGNOSIS — M797 Fibromyalgia: Secondary | ICD-10-CM

## 2017-11-16 DIAGNOSIS — R040 Epistaxis: Secondary | ICD-10-CM

## 2017-11-16 DIAGNOSIS — F5101 Primary insomnia: Secondary | ICD-10-CM

## 2017-11-16 DIAGNOSIS — M0579 Rheumatoid arthritis with rheumatoid factor of multiple sites without organ or systems involvement: Secondary | ICD-10-CM

## 2017-11-16 DIAGNOSIS — Z8679 Personal history of other diseases of the circulatory system: Secondary | ICD-10-CM

## 2017-11-16 NOTE — Patient Instructions (Signed)
Standing Labs We placed an order today for your standing lab work.    Please come back and get your standing labs in December and every 3 months   We have open lab Monday through Friday from 8:30-11:30 AM and 1:30-4:00 PM  at the office of Dr. Elayne Gruver.   You may experience shorter wait times on Monday and Friday afternoons. The office is located at [address], [address], [place], [place] [address] No appointment is necessary.   Labs are drawn by [facility].  You may receive a bill from [facility] for your lab work. If you have any questions regarding directions or hours of operation,  please call [phone].   Just as a reminder please drink plenty of water prior to coming for your lab work. Thanks!   

## 2018-01-05 ENCOUNTER — Other Ambulatory Visit: Payer: Self-pay

## 2018-01-05 DIAGNOSIS — Z79899 Other long term (current) drug therapy: Secondary | ICD-10-CM

## 2018-01-06 LAB — COMPLETE METABOLIC PANEL WITH GFR
AG Ratio: 1.2 (calc) (ref 1.0–2.5)
ALT: 10 U/L (ref 6–29)
AST: 14 U/L (ref 10–35)
Albumin: 4 g/dL (ref 3.6–5.1)
Alkaline phosphatase (APISO): 63 U/L (ref 33–130)
BUN: 17 mg/dL (ref 7–25)
CALCIUM: 9.5 mg/dL (ref 8.6–10.4)
CO2: 28 mmol/L (ref 20–32)
CREATININE: 0.88 mg/dL (ref 0.50–1.05)
Chloride: 107 mmol/L (ref 98–110)
GFR, EST NON AFRICAN AMERICAN: 74 mL/min/{1.73_m2} (ref >=60)
GFR, Est African American: 86 mL/min/{1.73_m2} (ref >=60)
GLOBULIN: 3.4 g/dL (ref 1.9–3.7)
Glucose, Bld: 87 mg/dL (ref 65–99)
Potassium: 4 mmol/L (ref 3.5–5.3)
Sodium: 143 mmol/L (ref 135–146)
Total Bilirubin: 0.5 mg/dL (ref 0.2–1.2)
Total Protein: 7.4 g/dL (ref 6.1–8.1)

## 2018-01-06 LAB — CBC WITH DIFFERENTIAL/PLATELET
Absolute Monocytes: 242 cells/uL (ref 200–950)
BASOS PCT: 1.2 %
Basophils Absolute: 49 cells/uL (ref 0–200)
Eosinophils Absolute: 111 cells/uL (ref 15–500)
Eosinophils Relative: 2.7 %
HCT: 37.6 % (ref 35.0–45.0)
Hemoglobin: 12 g/dL (ref 11.7–15.5)
Lymphs Abs: 1603 cells/uL (ref 850–3900)
MCH: 26.9 pg — AB (ref 27.0–33.0)
MCHC: 31.9 g/dL — ABNORMAL LOW (ref 32.0–36.0)
MCV: 84.3 fL (ref 80.0–100.0)
MONOS PCT: 5.9 %
MPV: 10.7 fL (ref 7.5–12.5)
Neutro Abs: 2095 cells/uL (ref 1500–7800)
Neutrophils Relative %: 51.1 %
PLATELETS: 296 10*3/uL (ref 140–400)
RBC: 4.46 10*6/uL (ref 3.80–5.10)
RDW: 12.9 % (ref 11.0–15.0)
TOTAL LYMPHOCYTE: 39.1 %
WBC: 4.1 10*3/uL (ref 3.8–10.8)

## 2018-01-19 LAB — HM COLONOSCOPY

## 2018-02-16 ENCOUNTER — Telehealth: Payer: Self-pay | Admitting: Family Medicine

## 2018-02-16 ENCOUNTER — Telehealth: Payer: Self-pay | Admitting: *Deleted

## 2018-02-16 NOTE — Telephone Encounter (Signed)
Noted. FYI sent to Dr. Swaziland.  Copied from CRM [id number]. Topic: General - Other >> Feb 16, 2018 11:03 AM Wyonia Hough E wrote: Reason for CRM:  Pt was seen at [facility] rheumatology in [facility] and the Dr. Quincy Carnes ordered a Bone density test. Pt will be bringing the order to Dr. Swaziland.

## 2018-02-16 NOTE — Telephone Encounter (Signed)
Pt came by to drop off a order from [facility] DXA bone density form for Dr. Swaziland to take a look at.  Form was placed in the red folder for Dr. Swaziland.

## 2018-02-17 LAB — HM MAMMOGRAPHY: HM Mammogram: NORMAL (ref 0–4)

## 2018-02-17 NOTE — Telephone Encounter (Signed)
Form placed on providers desk for completion

## 2018-02-21 ENCOUNTER — Other Ambulatory Visit: Payer: Self-pay

## 2018-02-21 DIAGNOSIS — Z79899 Other long term (current) drug therapy: Secondary | ICD-10-CM

## 2018-02-22 ENCOUNTER — Telehealth: Payer: Self-pay | Admitting: *Deleted

## 2018-02-22 DIAGNOSIS — Z79899 Other long term (current) drug therapy: Secondary | ICD-10-CM

## 2018-02-22 LAB — COMPLETE METABOLIC PANEL WITH GFR
AG Ratio: 1.3 (calc) (ref 1.0–2.5)
ALKALINE PHOSPHATASE (APISO): 60 U/L (ref 37–153)
ALT: 11 U/L (ref 6–29)
AST: 18 U/L (ref 10–35)
Albumin: 4.1 g/dL (ref 3.6–5.1)
BUN: 13 mg/dL (ref 7–25)
CO2: 29 mmol/L (ref 20–32)
CREATININE: 0.79 mg/dL (ref 0.50–1.05)
Calcium: 9 mg/dL (ref 8.6–10.4)
Chloride: 106 mmol/L (ref 98–110)
GFR, Est African American: 98 mL/min/{1.73_m2} (ref >=60)
GFR, Est Non African American: 84 mL/min/{1.73_m2} (ref >=60)
GLOBULIN: 3.1 g/dL (ref 1.9–3.7)
Glucose, Bld: 93 mg/dL (ref 65–99)
Potassium: 3.8 mmol/L (ref 3.5–5.3)
SODIUM: 141 mmol/L (ref 135–146)
Total Bilirubin: 0.6 mg/dL (ref 0.2–1.2)
Total Protein: 7.2 g/dL (ref 6.1–8.1)

## 2018-02-22 LAB — CBC WITH DIFFERENTIAL/PLATELET
Absolute Monocytes: 229 cells/uL (ref 200–950)
BASOS ABS: 20 {cells}/uL (ref 0–200)
Basophils Relative: 0.7 %
EOS ABS: 61 {cells}/uL (ref 15–500)
Eosinophils Relative: 2.1 %
HCT: 36 % (ref 35.0–45.0)
HEMOGLOBIN: 11.6 g/dL — AB (ref 11.7–15.5)
Lymphs Abs: 1183 cells/uL (ref 850–3900)
MCH: 27.5 pg (ref 27.0–33.0)
MCHC: 32.2 g/dL (ref 32.0–36.0)
MCV: 85.3 fL (ref 80.0–100.0)
MONOS PCT: 7.9 %
MPV: 10.9 fL (ref 7.5–12.5)
NEUTROS ABS: 1407 {cells}/uL — AB (ref 1500–7800)
NEUTROS PCT: 48.5 %
Platelets: 250 10*3/uL (ref 140–400)
RBC: 4.22 10*6/uL (ref 3.80–5.10)
RDW: 13.9 % (ref 11.0–15.0)
Total Lymphocyte: 40.8 %
WBC: 2.9 10*3/uL — ABNORMAL LOW (ref 3.8–10.8)

## 2018-02-22 MED ORDER — METHOTREXATE SODIUM CHEMO INJECTION 50 MG/2ML: 15.0000 mg | INTRAMUSCULAR | 0 refills | Status: DC

## 2018-02-22 NOTE — Telephone Encounter (Signed)
-----   Message from Pollyann Savoy, MD sent at 02/22/2018 12:17 PM EST ----- Neutropenia noted.  Please advise patient to reduce methotrexate to 0.6 mL subcu weekly.  Repeat CBC in 2 weeks after reducing the dose of methotrexate.

## 2018-02-22 NOTE — Progress Notes (Signed)
Neutropenia noted.  Please advise patient to reduce methotrexate to 0.6 mL subcu weekly.  Repeat CBC in 2 weeks after reducing the dose of methotrexate.

## 2018-02-25 ENCOUNTER — Telehealth: Payer: Self-pay | Admitting: Family Medicine

## 2018-02-25 NOTE — Telephone Encounter (Signed)
Copied from CRM [id number]. Topic: Quick Communication - See Telephone Encounter >> Feb 25, 2018  5:02 PM Jens Som A wrote: CRM for notification. See Telephone encounter for: 02/25/18.  Patient is calling because a form was dropped off from [facility] Dr. Brayton Caves. Requesting bone density exam. Advised to have Dr. Swaziland completed the paper work. Please advise [phone]

## 2018-02-28 ENCOUNTER — Other Ambulatory Visit: Payer: Self-pay | Admitting: *Deleted

## 2018-02-28 DIAGNOSIS — Z9189 Other specified personal risk factors, not elsewhere classified: Secondary | ICD-10-CM

## 2018-02-28 NOTE — Telephone Encounter (Signed)
Spoke with patient and she stated that [facility] wanted Dr. Swaziland to place referral. Referral placed as requested for bone density scan.

## 2018-02-28 NOTE — Telephone Encounter (Signed)
Referral for done density scan placed as requested.

## 2018-03-08 ENCOUNTER — Encounter: Payer: Self-pay | Admitting: Gastroenterology

## 2018-03-08 ENCOUNTER — Ambulatory Visit: Payer: BC Managed Care – PPO | Admitting: Gastroenterology

## 2018-03-08 VITALS — BP 130/70 | HR 68 | Ht 64.0 in | Wt 215.0 lb

## 2018-03-08 DIAGNOSIS — R1013 Epigastric pain: Secondary | ICD-10-CM | POA: Diagnosis not present

## 2018-03-08 DIAGNOSIS — K6289 Other specified diseases of anus and rectum: Secondary | ICD-10-CM | POA: Diagnosis not present

## 2018-03-08 DIAGNOSIS — K5909 Other constipation: Secondary | ICD-10-CM

## 2018-03-08 NOTE — Progress Notes (Signed)
[facility] Consult Note:  History: Patricia Reed 03/08/2018  Referring physician: Self-referred  Reason for consult/chief complaint: Abdominal Pain (Epigastric pain, hx of gastric ulcers); Melena (One occurrance 1 week prior); and Hemorrhoids (Constipation and bleeding, external)   Subjective  HPI:  Patricia Reed is a very pleasant woman with chronic upper abdominal pain and a rheumatologic condition self-referred for recent exacerbation of epigastric pain.  She saw Dr. Arlyce DiceKaplan in early 2016 for dysphagia and colon cancer screening.  Colonoscopy January of that year revealed no polyps, recommendation was for repeat exam in 10 years. Esophageal manometry was normal.  EGD March 2016 was normal, and a balloon dilation to undocumented diameter was performed.  Follow-up office note indicates improvement in dysphagia after that.  She was having frequent pyrosis and was started on Protonix. In addition, she had undergone EGD with LES Botox injection by Dr. Arlyce DiceKaplan in 2013. There are numerous EGD reports to that dating back to 1996, when the patient had a large gastric ulcer.  She was concerned that perhaps an ulcer had come back.  For the last 2 weeks she has had more constant and noticeable epigastric pain.  It does not seem appreciably worse with meals, position or time of day.  She says it feels like it did when her ulcer acted up and bled in the mid 1990s.  She has been on aspirin since 2014 for a reported MI.  She does not take other NSAIDs because they do not agree with her stomach.  She has periodically taken prednisone for flares of her arthritis. Last week she had a black stool and is concerned it may have been bleeding.  In addition, she has chronic constipation that she believes is due to her multiple medicines.  She has had rectal pressure, pain and itching that she believes is from hemorrhoids.  ROS:  Review of Systems  Constitutional: Positive for fatigue.  Negative for appetite change and unexpected weight change.  HENT: Negative for mouth sores and voice change.   Eyes: Negative for pain and redness.  Respiratory: Negative for cough and shortness of breath.   Cardiovascular: Negative for chest pain and palpitations.  Genitourinary: Negative for dysuria and hematuria.  Musculoskeletal: Positive for arthralgias and myalgias.  Skin: Negative for pallor and rash.  Neurological: Negative for weakness and headaches.  Hematological: Negative for adenopathy.     Past Medical History: Past Medical History:  Diagnosis Date  . Anemia   . Asthma   . Fibromyalgia   . GERD (gastroesophageal reflux disease)   . High cholesterol   . History of stomach ulcers   . Hypertension   . Lupus (HCC)   . MI, old   . Rheumatoid arthritis (HCC)   . Thyroid mass   . Vertigo    I reviewed the most recent rheumatology note regarding the patient's seropositive rheumatoid arthritis that was reportedly improved on Xeljanz, and she had briefly stopped the methotrexate, but went back on it shortly afterwards.  Past Surgical History: Past Surgical History:  Procedure Laterality Date  . ABDOMINAL HYSTERECTOMY     done for menorrhagia, ovaries remain  . CERVICAL DISCECTOMY     plates and screws in neck  . CESAREAN SECTION    . COLONOSCOPY    . ESOPHAGEAL MANOMETRY N/A 02/12/2014   Procedure: ESOPHAGEAL MANOMETRY (EM);  Surgeon: Louis Meckelobert D Kaplan, MD;  Location: [facility];  Service: Endoscopy;  Laterality: N/A;  . ESOPHAGOGASTRODUODENOSCOPY  03/04/2011   Procedure: ESOPHAGOGASTRODUODENOSCOPY (EGD);  Surgeon: Louis Meckel, MD;  Location: [facility];  Service: Endoscopy;  Laterality: N/A;  BOTOX Injection  . exc benign breast lump    . TONSILLECTOMY    . UPPER GASTROINTESTINAL ENDOSCOPY       Family History: Family History  Problem Relation Age of Onset  . Alcohol abuse Father   . Hypertension Father   . Heart disease Father   . Colon cancer  Maternal Uncle   . Diabetes Maternal Grandmother   . Colon polyps Neg Hx   . Esophageal cancer Neg Hx   . Kidney disease Neg Hx   . Stomach cancer Neg Hx   . Rectal cancer Neg Hx     Social History: Social History   Socioeconomic History  . Marital status: Married    Spouse name: Not on file  . Number of children: 4  . Years of education: Not on file  . Highest education level: Not on file  Occupational History  . Occupation: Lobbyist: [employer]  Social Needs  . Financial resource strain: Not on file  . Food insecurity:    Worry: Not on file    Inability: Not on file  . Transportation needs:    Medical: Not on file    Non-medical: Not on file  Tobacco Use  . Smoking status: Never Smoker  . Smokeless tobacco: Never Used  Substance and Sexual Activity  . Alcohol use: Yes    Alcohol/week: 1.0 standard drinks    Types: 1 Glasses of wine per week    Comment: occ  . Drug use: No  . Sexual activity: Yes    Birth control/protection: None, Surgical    Comment: LAVH  Lifestyle  . Physical activity:    Days per week: Not on file    Minutes per session: Not on file  . Stress: Not on file  Relationships  . Social connections:    Talks on phone: Not on file    Gets together: Not on file    Attends religious service: Not on file    Active member of club or organization: Not on file    Attends meetings of clubs or organizations: Not on file    Relationship status: Not on file  Other Topics Concern  . 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      Allergies: Allergies  Allergen Reactions  . Aspirin Other (See Comments)    GI bleed  . Hydrocodone-Acetaminophen Hives and Nausea And Vomiting  . Ibuprofen Other (See Comments)    GI bleed  . Imdur [Isosorbide Nitrate] Other (See Comments)    Reaction unknown  . Meperidine Hcl Hives and Nausea And Vomiting     Short term memory loss  . Morphine Hives and Nausea And Vomiting  . Oxycodone-Acetaminophen Hives and Nausea And Vomiting    Reaction unknown  . Procaine Hcl Other (See Comments)    Ineffective  . Toprol Xl [Metoprolol] Other (See Comments)    Reaction unknown  . Tramadol Itching    Outpatient Meds: Current Outpatient Medications  Medication Sig Dispense Refill  . acetaminophen (TYLENOL) 650 MG CR tablet Take 650 mg by mouth 2 (two) times daily.     Marland Kitchen aspirin 81 MG tablet Take 81 mg by mouth daily.    . cyclobenzaprine (FLEXERIL) 10 MG tablet Take 1 tablet (10 mg total) by mouth 2 (two) times  daily as needed for muscle spasms. 20 tablet 0  . diclofenac sodium (VOLTAREN) 1 % GEL Apply 3 g to 3 large joints up to 3 times daily. 3 Tube 3  . fluticasone (FLONASE) 50 MCG/ACT nasal spray Place 2 sprays into both nostrils daily. (Patient taking differently: Place 2 sprays into both nostrils as needed. ) 16 g 6  . folic acid (FOLVITE) 1 MG tablet Take 2 tablets (2 mg total) by mouth daily. 180 tablet 3  . methotrexate 50 MG/2ML injection Inject 0.6 mLs (15 mg total) into the skin once a week. 8 mL 0  . pantoprazole (PROTONIX) 40 MG tablet TAKE 1 TABLET BY MOUTH EVERY DAY 90 tablet 1  . Tuberculin-Allergy Syringes (ALLERGY SYRINGE 1CC/27GX1/2") 27G X 1/2" 1 ML MISC Patient to use to inject MTX weekly 12 each 3  . valACYclovir (VALTREX) 500 MG tablet 1 tab bid for 3 days, start < 48 hours after onset. 18 tablet 2  . vitamin B-12 (CYANOCOBALAMIN) 100 MCG tablet Take 100 mcg by mouth daily.    Marland Kitchen VITAMIN D, ERGOCALCIFEROL, PO Take 1 capsule by mouth daily.    [name] XR 11 MG TB24 Take 1 tablet by mouth daily.     No current facility-administered medications for this visit.       ___________________________________________________________________ Objective   Exam:  BP 130/70   Pulse 68   Ht  (1.626 m)   Wt 215 lb (97.5 kg)   BMI 36.90 kg/m  Exam chaperoned by MA June  General:  Well-appearing woman, pleasant and conversational  Eyes: sclera anicteric, no redness  ENT: oral mucosa moist without lesions, no cervical or supraclavicular lymphadenopathy  CV: RRR without murmur, S1/S2, no JVD, no peripheral edema  Resp: clear to auscultation bilaterally, normal RR and effort noted  GI: soft, epigastric tenderness to light palpation of the abdominal wall, with active bowel sounds. No guarding or palpable organomegaly noted.  Skin; warm and dry, no rash or jaundice noted  Neuro: awake, alert and oriented x 3. Normal gross motor function and fluent speech Rectal: Normal external, normal DRE and copious firm light brown stool in rectal vault.  Labs:  CBC Latest Ref Rng & Units 02/21/2018 01/05/2018 10/11/2017  WBC 3.8 - 10.8 Thousand/uL 2.9(L) 4.1 3.7(L)  Hemoglobin 11.7 - 15.5 g/dL 11.6(L) 12.0 12.0  Hematocrit 35.0 - 45.0 % 36.0 37.6 37.2  Platelets 140 - 400 Thousand/uL 250 296 294   CMP Latest Ref Rng & Units 02/21/2018 01/05/2018 10/11/2017  Glucose 65 - 99 mg/dL 93 87 98  BUN 7 - 25 mg/dL Creatinine 0.50 - 1.05 mg/dL 4.09 8.11 9.14  Sodium 135 - 146 mmol/L 141 143 141  Potassium 3.5 - 5.3 mmol/L 3.8 4.0 3.7  Chloride 98 - 110 mmol/L 106 107 106  CO2 20 - 32 mmol/L Calcium 8.6 - 10.4 mg/dL 9.0 9.5 9.5  Total Protein 6.1 - 8.1 g/dL 7.2 7.4 7.4  Total Bilirubin 0.2 - 1.2 mg/dL 0.6 0.5 0.7  Alkaline Phos 38 - 126 U/L - - -  AST 10 - 35 U/L ALT 6 - 29 U/L Assessment: Encounter Diagnoses  Name Primary?  . Epigastric pain Yes  . Chronic constipation   . Rectal pain     Acute on chronic epigastric pain and patient with underlying rheumatologic condition and chronic pain syndrome.  Given the degree of tenderness to  light palpation, I wonder if this may be abdominal wall pain.  Her history of aspirin use and prior ulcer and intermittent prednisone use raise some concern for recurrent ulcer.  She is very concerned  about that possibility.  Plan:  Upper endoscopy.  She is agreeable after discussion of procedure and risks.  The benefits and risks of the planned procedure were described in detail with the patient or (when appropriate) their health care proxy.  Risks were outlined as including, but not limited to, bleeding, infection, perforation, adverse medication reaction leading to cardiac or pulmonary decompensation, or pancreatitis (if ERCP).  The limitation of incomplete mucosal visualization was also discussed.  No guarantees or warranties were given.  Asked her to stay off aspirin at least until the endoscopy is done.  If no ulcer found, she should be able to go back on it.  Thank you for the courtesy of this consult.  Please call me with any questions or concerns.  Charlie PitterHenry L Danis III  CC: Betty SwazilandJordan, MD

## 2018-03-08 NOTE — Patient Instructions (Addendum)
If you are age 56 or older, your body mass index should be between 23-30. Your Body mass index is 36.9 kg/m. If this is out of the aforementioned range listed, please consider follow up with your Primary Care Provider.  If you are age 11 or younger, your body mass index should be between 19-25. Your Body mass index is 36.9 kg/m. If this is out of the aformentioned range listed, please consider follow up with your Primary Care Provider.   You have been scheduled for an endoscopy. Please follow written instructions given to you at your visit today. If you use inhalers (even only as needed), please bring them with you on the day of your procedure. Your physician has requested that you go to www.startemmi.com and enter the access code given to you at your visit today. This web site gives a general overview about your procedure. However, you should still follow specific instructions given to you by our office regarding your preparation for the procedure.  Miralax 1/2 cap a day to start then increase as needed.  It was a pleasure to see you today!  Dr. Myrtie Neither

## 2018-03-09 ENCOUNTER — Encounter: Payer: Self-pay | Admitting: Gastroenterology

## 2018-03-10 ENCOUNTER — Other Ambulatory Visit: Payer: Self-pay

## 2018-03-10 DIAGNOSIS — Z79899 Other long term (current) drug therapy: Secondary | ICD-10-CM

## 2018-03-11 LAB — CBC WITH DIFFERENTIAL/PLATELET
Absolute Monocytes: 189 cells/uL — ABNORMAL LOW (ref 200–950)
Basophils Absolute: 41 cells/uL (ref 0–200)
Basophils Relative: 1.1 %
Eosinophils Absolute: 59 cells/uL (ref 15–500)
Eosinophils Relative: 1.6 %
HCT: 34.9 % — ABNORMAL LOW (ref 35.0–45.0)
Hemoglobin: 11.5 g/dL — ABNORMAL LOW (ref 11.7–15.5)
Lymphs Abs: 1092 cells/uL (ref 850–3900)
MCH: 28 pg (ref 27.0–33.0)
MCHC: 33 g/dL (ref 32.0–36.0)
MCV: 84.9 fL (ref 80.0–100.0)
MPV: 10.9 fL (ref 7.5–12.5)
Monocytes Relative: 5.1 %
Neutro Abs: 2320 cells/uL (ref 1500–7800)
Neutrophils Relative %: 62.7 %
Platelets: 276 10*3/uL (ref 140–400)
RBC: 4.11 10*6/uL (ref 3.80–5.10)
RDW: 14.4 % (ref 11.0–15.0)
Total Lymphocyte: 29.5 %
WBC: 3.7 10*3/uL — ABNORMAL LOW (ref 3.8–10.8)

## 2018-03-11 LAB — COMPLETE METABOLIC PANEL WITH GFR
AG Ratio: 1.5 (calc) (ref 1.0–2.5)
ALT: 13 U/L (ref 6–29)
AST: 17 U/L (ref 10–35)
Albumin: 4.3 g/dL (ref 3.6–5.1)
Alkaline phosphatase (APISO): 61 U/L (ref 37–153)
BUN: 15 mg/dL (ref 7–25)
CO2: 28 mmol/L (ref 20–32)
Calcium: 9 mg/dL (ref 8.6–10.4)
Chloride: 107 mmol/L (ref 98–110)
Creat: 0.78 mg/dL (ref 0.50–1.05)
GFR, EST AFRICAN AMERICAN: 99 mL/min/{1.73_m2} (ref >=60)
GFR, Est Non African American: 86 mL/min/{1.73_m2} (ref >=60)
Globulin: 2.9 g/dL (calc) (ref 1.9–3.7)
Glucose, Bld: 88 mg/dL (ref 65–99)
Potassium: 4 mmol/L (ref 3.5–5.3)
Sodium: 144 mmol/L (ref 135–146)
TOTAL PROTEIN: 7.2 g/dL (ref 6.1–8.1)
Total Bilirubin: 0.5 mg/dL (ref 0.2–1.2)

## 2018-03-11 NOTE — Progress Notes (Signed)
Labs are stable.

## 2018-03-15 ENCOUNTER — Encounter: Payer: Self-pay | Admitting: *Deleted

## 2018-03-15 ENCOUNTER — Ambulatory Visit: Payer: BC Managed Care – PPO | Admitting: Family Medicine

## 2018-03-15 ENCOUNTER — Encounter: Payer: Self-pay | Admitting: Family Medicine

## 2018-03-15 VITALS — BP 124/83 | HR 85 | Temp 98.5°F | Resp 12 | Ht 64.0 in | Wt 212.2 lb

## 2018-03-15 DIAGNOSIS — M1990 Unspecified osteoarthritis, unspecified site: Secondary | ICD-10-CM | POA: Diagnosis not present

## 2018-03-15 DIAGNOSIS — M2550 Pain in unspecified joint: Secondary | ICD-10-CM

## 2018-03-15 DIAGNOSIS — M797 Fibromyalgia: Secondary | ICD-10-CM | POA: Diagnosis not present

## 2018-03-15 MED ORDER — PREDNISONE 20 MG PO TABS: ORAL_TABLET | ORAL | 0 refills | Status: AC

## 2018-03-15 MED ORDER — TRAMADOL HCL 50 MG PO TABS: 50.0000 mg | ORAL_TABLET | Freq: Two times a day (BID) | ORAL | 0 refills | Status: AC | PRN

## 2018-03-15 NOTE — Progress Notes (Signed)
ACUTE VISIT   HPI:  Chief Complaint  Patient presents with  . Generalized Body Aches    joint pain that started Monday    [name] is a 58 y.o. female, who is here today complaining of 6 days of generalized arthralgias, left shoulder limitation of movement due to pain. She states that she has had similar symptoms in the past when she is ready to have a RA flare up. Problem is getting worse.  She is also reporting intermittent joint edema, no erythema. Pain is constant, severe, 10/10 achy pain of shoulders, elbows, wrist, PIP, hips, knees, and ankles. Pain is exacerbated by any movement and alleviated some by rest.  Pain interferes with his sleep. + Fatigue.  She also has history of fibromyalgia, states that at this time she does not have myalgias just joint pain.  She follows with rheumatologist. Currently she is on methotrexate 50 mg injection and Xeljanz XR for RA.  She has not had an exacerbation in "a while."  She has not noted fever, chills, sore throat, or other respiratory symptoms. No sick contacts or recent travel.   She has not tried OTC medication.   Review of Systems  Constitutional: Positive for activity change and fatigue. Negative for appetite change and fever.  HENT: Negative for congestion, ear pain, mouth sores, sinus pressure and sore throat.   Eyes: Negative for discharge and redness.  Respiratory: Positive for cough. Negative for shortness of breath and wheezing.   Gastrointestinal: Negative for abdominal pain, diarrhea, nausea and vomiting.  Musculoskeletal: Positive for arthralgias and joint swelling. Negative for myalgias.  Skin: Negative for rash.  Allergic/Immunologic: Positive for environmental allergies.  Neurological: Negative for weakness, numbness and headaches.  Hematological: Negative for adenopathy. Does not bruise/bleed easily.      Current Outpatient Medications on File Prior to Visit  Medication Sig Dispense  Refill  . acetaminophen (TYLENOL) 650 MG CR tablet Take 650 mg by mouth 2 (two) times daily.     Marland Kitchen. aspirin 81 MG tablet Take 81 mg by mouth daily.    . B-D INS SYR ULTRAFINE 1CC/30G 30G X 1/2" 1 ML MISC     . diclofenac sodium (VOLTAREN) 1 % GEL Apply 3 g to 3 large joints up to 3 times daily. 3 Tube 3  . fluticasone (FLONASE) 50 MCG/ACT nasal spray Place 2 sprays into both nostrils daily. (Patient taking differently: Place 2 sprays into both nostrils as needed. ) 16 g 6  . folic acid (FOLVITE) 1 MG tablet Take 2 tablets (2 mg total) by mouth daily. 180 tablet 3  . methotrexate 50 MG/2ML injection Inject 0.6 mLs (15 mg total) into the skin once a week. 8 mL 0  . pantoprazole (PROTONIX) 40 MG tablet TAKE 1 TABLET BY MOUTH EVERY DAY 90 tablet 1  . Tuberculin-Allergy Syringes (ALLERGY SYRINGE 1CC/27GX1/2") 27G X 1/2" 1 ML MISC Patient to use to inject MTX weekly 12 each 3  . valACYclovir (VALTREX) 500 MG tablet 1 tab bid for 3 days, start < 48 hours after onset. 18 tablet 2  . vitamin B-12 (CYANOCOBALAMIN) 100 MCG tablet Take 100 mcg by mouth daily.    Marland Kitchen. VITAMIN D, ERGOCALCIFEROL, PO Take 1 capsule by mouth daily.    [name]. XELJANZ XR 11 MG TB24 Take 1 tablet by mouth daily.    . cyclobenzaprine (FLEXERIL) 10 MG tablet Take 1 tablet (10 mg total) by mouth 2 (two) times daily as needed for muscle  spasms. (Patient not taking: Reported on 03/15/2018) 20 tablet 0   No current facility-administered medications on file prior to visit.      Past Medical History:  Diagnosis Date  . Anemia   . Asthma   . Fibromyalgia   . GERD (gastroesophageal reflux disease)   . High cholesterol   . History of stomach ulcers   . Hypertension   . Lupus (HCC)   . MI, old   . Rheumatoid arthritis (HCC)   . Thyroid mass   . Vertigo    Allergies  Allergen Reactions  . Aspirin Other (See Comments)    GI bleed  . Hydrocodone-Acetaminophen Hives and Nausea And Vomiting  . Ibuprofen Other (See Comments)    GI bleed    . Imdur [Isosorbide Nitrate] Other (See Comments)    Reaction unknown  . Meperidine Hcl Hives and Nausea And Vomiting    Short term memory loss  . Morphine Hives and Nausea And Vomiting  . Oxycodone-Acetaminophen Hives and Nausea And Vomiting    Reaction unknown  . Procaine Hcl Other (See Comments)    Ineffective  . Toprol Xl [Metoprolol] Other (See Comments)    Reaction unknown  . Tramadol Itching    Social History   Socioeconomic History  . Marital status: Married    Spouse name: Not on file  . Number of children: 4  . Years of education: Not on file  . Highest education level: Not on file  Occupational History  . Occupation: LobbyistTEACHER ASSISTANT    Employer: [employer]  Social Needs  . Financial resource strain: Not on file  . Food insecurity:    Worry: Not on file    Inability: Not on file  . Transportation needs:    Medical: Not on file    Non-medical: Not on file  Tobacco Use  . Smoking status: Never Smoker  . Smokeless tobacco: Never Used  Substance and Sexual Activity  . Alcohol use: Yes    Alcohol/week: 1.0 standard drinks    Types: 1 Glasses of wine per week    Comment: occ  . Drug use: No  . Sexual activity: Yes    Birth control/protection: None, Surgical    Comment: LAVH  Lifestyle  . Physical activity:    Days per week: Not on file    Minutes per session: Not on file  . Stress: Not on file  Relationships  . Social connections:    Talks on phone: Not on file    Gets together: Not on file    Attends religious service: Not on file    Active member of club or organization: Not on file    Attends meetings of clubs or organizations: Not on file    Relationship status: Not on file  Other Topics Concern  . Not on file  Social History Narrative   First grade Geologist, engineeringteacher assistant.  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      Vitals:   03/15/18 1129  BP: 124/83  Pulse: 85  Resp: 12  Temp: 98.5 F (36.9 C)  SpO2: 100%   Body mass  index is 36.43 kg/m.   Physical Exam  Nursing note and vitals reviewed. Constitutional: She is oriented to person, place, and time. She appears well-developed. No distress.  HENT:  Head: Normocephalic and atraumatic.  Mouth/Throat: Oropharynx is clear and moist and mucous membranes are normal.  Eyes: Pupils are equal, round, and reactive to light. Conjunctivae are normal.  Cardiovascular: Normal  rate and regular rhythm.  No murmur heard. Pulses:      Dorsalis pedis pulses are 2+ on the right side and 2+ on the left side.  Respiratory: Effort normal and breath sounds normal. No respiratory distress.  GI: Soft. She exhibits no mass. There is no hepatomegaly. There is no abdominal tenderness.  Musculoskeletal:        General: No edema.     Comments: Antalgic gait. Left shoulder decreased range of motion. PIP with tenderness upon palpation. No signs of synovitis appreciated today.  No trigger points present today.  Lymphadenopathy:    She has no cervical adenopathy.  Neurological: She is alert and oriented to person, place, and time. She has normal strength. No cranial nerve deficit.  Skin: Skin is warm. No rash noted. No erythema.  Psychiatric: Her mood appears anxious.  Well groomed, good eye contact.   ASSESSMENT AND PLAN:  [name] was seen today for generalized body aches.  Diagnoses and all orders for this visit:  Inflammatory arthritis She will need to discontinue methotrexate and Xeljanz XR while she is taking prednisone. Recommend calling rheumatologist if she has any question about these 2 medications. We discussed side effects of prednisone, she has tolerated well in the past. Instructed about warning signs.  -     predniSONE (DELTASONE) 20 MG tablet; 3 tabs for 3 days, 2 tabs for 3 days, 1 tabs for 3 days, and 1/2 tab for 3 days. Take tables together with breakfast.  Fibromyalgia This probably could be contributing to current symptoms. She does not think  symptoms she is having today are related with fibromyalgia. Continue following with rheumatologist.  Polyarthralgia No signs of synovitis. She cannot tolerate NSAIDs because of GI issues. Recommend tramadol 50 mg twice daily as needed, mainly at bedtime. Instructed to call her rheumatologist if problem does not improve.  -     traMADol (ULTRAM) 50 MG tablet; Take 1 tablet (50 mg total) by mouth every 12 (twelve) hours as needed for up to 3 days.    Return if symptoms worsen or fail to improve.     [name]. Swaziland, MD  [facility]. [place] office.

## 2018-03-15 NOTE — Patient Instructions (Signed)
A few things to remember from today's visit:   Inflammatory arthritis - Plan: predniSONE (DELTASONE) 20 MG tablet  Fibromyalgia  Please contact your rheumatologist if symptoms are persistent. Monitor for new symptoms.  Please be sure medication list is accurate. If a new problem present, please set up appointment sooner than planned today.

## 2018-03-18 ENCOUNTER — Telehealth: Payer: Self-pay | Admitting: *Deleted

## 2018-03-18 NOTE — Telephone Encounter (Signed)
Left message for patient to return call to clinic.  Copied from CRM [id number]. Topic: General - Other >> Mar 18, 2018 10:51 AM Arlyss Gandy, NT wrote: Reason for CRM: Pt states that she is needing extended time for work. Requesting a call to discuss.

## 2018-03-18 NOTE — Telephone Encounter (Signed)
Spoke with patient and she stated that she still doesn't feel well. Patient stated that she is taking the prednisone, but is having some GI issues with it. Patient stated that she has a procedure on Monday and would like work note extended until Friday, 03/25/2018. If note can be provided, she will have her daughter pick-up on Monday.

## 2018-03-21 ENCOUNTER — Encounter: Payer: Self-pay | Admitting: Gastroenterology

## 2018-03-21 ENCOUNTER — Ambulatory Visit (AMBULATORY_SURGERY_CENTER): Payer: BC Managed Care – PPO | Admitting: Gastroenterology

## 2018-03-21 ENCOUNTER — Other Ambulatory Visit: Payer: Self-pay

## 2018-03-21 VITALS — BP 190/98 | HR 69 | Temp 96.6°F | Resp 14 | Ht 64.0 in | Wt 212.0 lb

## 2018-03-21 DIAGNOSIS — R1013 Epigastric pain: Secondary | ICD-10-CM

## 2018-03-21 MED ORDER — SODIUM CHLORIDE 0.9 % IV SOLN: 500.0000 mL | Freq: Once | INTRAVENOUS | Status: DC

## 2018-03-21 NOTE — Progress Notes (Signed)
Report given to PACU, vss 

## 2018-03-21 NOTE — Patient Instructions (Signed)
YOU HAD AN ENDOSCOPIC PROCEDURE TODAY AT THE [facility]:   Refer to the procedure report that was given to you for any specific questions about what was found during the examination.  If the procedure report does not answer your questions, please call your gastroenterologist to clarify.  If you requested that your care partner not be given the details of your procedure findings, then the procedure report has been included in a sealed envelope for you to review at your convenience later.  YOU SHOULD EXPECT: Some feelings of bloating in the abdomen. Passage of more gas than usual.  Walking can help get rid of the air that was put into your GI tract during the procedure and reduce the bloating.   Please Note:  You might notice some irritation and congestion in your nose or some drainage.  This is from the oxygen used during your procedure.  There is no need for concern and it should clear up in a day or so.  SYMPTOMS TO REPORT IMMEDIATELY:    Following upper endoscopy (EGD)  Vomiting of blood or coffee ground material  New chest pain or pain under the shoulder blades  Painful or persistently difficult swallowing  New shortness of breath  Fever of 100F or higher  Black, tarry-looking stools  For urgent or emergent issues, a gastroenterologist can be reached at any hour by calling [phone].   DIET:  We do recommend a small meal at first, but then you may proceed to your regular diet.  Drink plenty of fluids but you should avoid alcoholic beverages for 24 hours.  ACTIVITY:  You should plan to take it easy for the rest of today and you should NOT DRIVE or use heavy machinery until tomorrow (because of the sedation medicines used during the test).    FOLLOW UP: Our staff will call the number listed on your records the next business day following your procedure to check on you and address any questions or concerns that you may have regarding the information given to you  following your procedure. If we do not reach you, we will leave a message.  However, if you are feeling well and you are not experiencing any problems, there is no need to return our call.  We will assume that you have returned to your regular daily activities without incident.   SIGNATURES/CONFIDENTIALITY: You and/or your care partner have signed paperwork which will be entered into your electronic medical record.  These signatures attest to the fact that that the information above on your After Visit Summary has been reviewed and is understood.  Full responsibility of the confidentiality of this discharge information lies with you and/or your care-partner.

## 2018-03-21 NOTE — Telephone Encounter (Signed)
It is okay to provide note as requested. Thanks, BJ

## 2018-03-21 NOTE — Op Note (Signed)
[facility] Patient Name: Patricia Reed Procedure Date: 03/21/2018 10:33 AM MRN: [id number] Endoscopist: Sherilyn Cooter L. Myrtie Neither , MD Age: 58 Referring MD:  Date of Birth: Nov 11, 1960 Gender: Female Account #: [id number] Procedure:                Upper GI endoscopy Indications:              Epigastric abdominal pain, history of gastrc ulcer Medicines:                Monitored Anesthesia Care Procedure:                Pre-Anesthesia Assessment:                           - Prior to the procedure, a History and Physical                            was performed, and patient medications and                            allergies were reviewed. The patient's tolerance of                            previous anesthesia was also reviewed. The risks                            and benefits of the procedure and the sedation                            options and risks were discussed with the patient.                            All questions were answered, and informed consent                            was obtained. Anticoagulants: The patient has taken                            aspirin. It was decided not to withhold this                            medication prior to the procedure. ASA Grade                            Assessment: III - A patient with severe systemic                            disease. After reviewing the risks and benefits,                            the patient was deemed in satisfactory condition to                            undergo the procedure.  After obtaining informed consent, the endoscope was                            passed under direct vision. Throughout the                            procedure, the patient's blood pressure, pulse, and                            oxygen saturations were monitored continuously. The                            Endoscope was introduced through the mouth, and                            advanced to the second  part of duodenum. The upper                            GI endoscopy was accomplished without difficulty.                            The patient tolerated the procedure well. Scope In: Scope Out: Findings:                 The esophagus was normal.                           The stomach was normal.                           The cardia and gastric fundus were normal on                            retroflexion.                           The examined duodenum was normal. Complications:            No immediate complications. Estimated Blood Loss:     Estimated blood loss: none. Impression:               - Normal esophagus.                           - Normal stomach.                           - Normal examined duodenum.                           - No specimens collected.                           Upper abdominal wall musculoskeletal pain, most                            likely related to rheumatologic condition. Recommendation:           -  Patient has a contact number available for                            emergencies. The signs and symptoms of potential                            delayed complications were discussed with the                            patient. Return to normal activities tomorrow.                            Written discharge instructions were provided to the                            patient.                           - Resume previous diet.                           - Continue present medications. Henry L. Myrtie Neither, MD 03/21/2018 10:46:05 AM This report has been signed electronically.

## 2018-03-22 ENCOUNTER — Encounter: Payer: Self-pay | Admitting: *Deleted

## 2018-03-22 ENCOUNTER — Telehealth: Payer: Self-pay

## 2018-03-22 NOTE — Telephone Encounter (Signed)
  Follow up Call-  Call back number 03/21/2018  Post procedure Call Back phone  # [phone]  Permission to leave phone message Yes  Some recent data might be hidden     Patient questions:  Do you have a fever, pain , or abdominal swelling? No. Pain Score  0 *  Have you tolerated food without any problems? Yes.    Have you been able to return to your normal activities? Yes.    Do you have any questions about your discharge instructions: Diet   No. Medications  No. Follow up visit  No.  Do you have questions or concerns about your Care? No.  Actions: * If pain score is 4 or above: No action needed, pain <4.

## 2018-03-22 NOTE — Telephone Encounter (Signed)
Note provided and placed at front desk for pick-up.

## 2018-04-04 ENCOUNTER — Telehealth: Payer: Self-pay | Admitting: Rheumatology

## 2018-04-04 ENCOUNTER — Other Ambulatory Visit: Payer: Self-pay

## 2018-04-04 ENCOUNTER — Other Ambulatory Visit: Payer: Self-pay | Admitting: Family Medicine

## 2018-04-04 ENCOUNTER — Encounter: Payer: Self-pay | Admitting: Pharmacist

## 2018-04-04 DIAGNOSIS — K2940 Chronic atrophic gastritis without bleeding: Secondary | ICD-10-CM

## 2018-04-04 DIAGNOSIS — R1013 Epigastric pain: Secondary | ICD-10-CM

## 2018-04-04 DIAGNOSIS — Z79899 Other long term (current) drug therapy: Secondary | ICD-10-CM

## 2018-04-04 NOTE — Telephone Encounter (Signed)
Letter faxed.

## 2018-04-04 NOTE — Telephone Encounter (Signed)
Patient request a work note to be faxed stating she is a high risk patient, and cannot go to work for the next 2 weeks. Patient works in a school. Please fax to attn. Principal Johnita Readus at Fax# [phone]

## 2018-04-04 NOTE — Telephone Encounter (Signed)
Okay to give letter per Dr. Deveshwar 

## 2018-04-05 LAB — CBC WITH DIFFERENTIAL/PLATELET
ABSOLUTE MONOCYTES: 239 {cells}/uL (ref 200–950)
BASOS PCT: 0.5 %
Basophils Absolute: 21 cells/uL (ref 0–200)
EOS ABS: 50 {cells}/uL (ref 15–500)
Eosinophils Relative: 1.2 %
HCT: 35.6 % (ref 35.0–45.0)
Hemoglobin: 11.4 g/dL — ABNORMAL LOW (ref 11.7–15.5)
Lymphs Abs: 1109 cells/uL (ref 850–3900)
MCH: 28 pg (ref 27.0–33.0)
MCHC: 32 g/dL (ref 32.0–36.0)
MCV: 87.5 fL (ref 80.0–100.0)
MPV: 11.3 fL (ref 7.5–12.5)
Monocytes Relative: 5.7 %
Neutro Abs: 2780 cells/uL (ref 1500–7800)
Neutrophils Relative %: 66.2 %
Platelets: 282 10*3/uL (ref 140–400)
RBC: 4.07 10*6/uL (ref 3.80–5.10)
RDW: 14.6 % (ref 11.0–15.0)
Total Lymphocyte: 26.4 %
WBC: 4.2 10*3/uL (ref 3.8–10.8)

## 2018-04-18 NOTE — Progress Notes (Signed)
Virtual Visit via Telephone Note  I connected with Patricia Reed on 04/19/18 at 10:15 AM EDT by telephone and verified that I am speaking with the correct person using two identifiers.   I discussed the limitations, risks, security and privacy concerns of performing an evaluation and management service by telephone and the availability of in person appointments. I also discussed with the patient that there may be a patient responsible charge related to this service. The patient expressed understanding and agreed to proceed.  CC: Morning stiffness  History of Present Illness: Patient is a 58 year old female with a past medical history of with mixed connective tissue disease, seropositive rheumatoid arthritis, and fibromyalgia.  She is on MTX 0.6 ml sq once weekly, folic acid 2 mg po daily, and Xeljanz 11 mg XR. She continues to [facility]. She was started on prednisone 2.5 mg po daily on 04/05/18.    She reports she is having increased morning stiffness. She has noticed improvement with combination therapy with MTX and xeljanz.  She denies any joint swelling right now. She has bilateral trochanteric bursitis. She cannot laying on her sides at night due to the discomfort. She continues to have generalized muscle aches and muscle tenderness.  She takes tylenol arthritis for pain relief.  Review of Systems  Constitutional: Positive for malaise/fatigue. Negative for fever.  HENT:       Reports mouth dryness  Eyes: Negative for photophobia, pain and redness.  Respiratory: Negative for cough and shortness of breath.   Cardiovascular: Negative for chest pain and palpitations.  Gastrointestinal: Positive for constipation. Negative for blood in stool and diarrhea.  Genitourinary: Negative for dysuria.  Musculoskeletal: Positive for joint pain and myalgias.  Skin: Negative for rash.  Neurological: Negative for dizziness and headaches.  Psychiatric/Behavioral: Negative for  depression. The patient has insomnia. The patient is not nervous/anxious.       Observations/Objective: Physical Exam  Constitutional: She is oriented to person, place, and time.  Neurological: She is alert and oriented to person, place, and time.  Psychiatric: Mood, memory, affect and judgment normal.  Denies digital ulcerations or signs of gangrene. Denies any recent rashes.  Denies oral or nasal ulcerations currently.   Patient reports morning stiffness for 2 hours.   Patient reports nocturnal pain.  Difficulty dressing/grooming: Denies Difficulty climbing stairs: Reports Difficulty getting out of chair: Reports Difficulty using hands for taps, buttons, cutlery, and/or writing: Reports  Assessment and Plan: Mixed connective tissue disease (HCC) - +ANA,+Sm,+RNP,+RF, arthritis.AVISE labs were discussed with patient.  I reviewed her records from [facility] appointment on 04/05/18.  Office visit notes and labs were reviewed and discussed with patient.  She was started on long term low dose prednisone 2.5 mg po daily.  She is clinically doing better on MTX 0.6 ml sq once weekly, folic acid 2 mg po daily, and Xeljanz 11 mg XR combination therapy.   She will continue on the current treatment regimen. She continues to have morning stiffness but no joint swelling at this time.  She has intermittent symptoms of Raynaud's but no digital ulcerations or signs of gangrene.  She has not had any recent rashes. She has no new concerns. She does not need any refills at this time. Future orders for autoimmune lab work was placed today.  She will return for lab work in May 2020.   She was advised to notify us if she develops any new or worsening symptoms.  She has  an appointment with Dr. Verdon Cummins in June 2020 and will follow up in our office in 3 months.   Rheumatoid arthritis involving multiple sites with positive rheumatoid factor (HCC)-She had an appointment with Dr. Verdon Cummins at [facility] on 04/05/18.  At that visit she had  mild inflammation and was started on low dose prednisone 2.5 mg po daily.  She continues to inject MTX 0.6 ml sq once weekly, takes folic acid 2 mg po daily, and Xeljanz 11 mg XR.  She feels better on combination therapy.  She does not need any refills at this time. She was advised to notify us if she develops increased joint pain and joint swelling.   High risk medication use -  MTX 0.6 ml sq q wk, folic acid 2mg  po qd, Xeljanz 11 mg XR. CBC was drawn on 04/04/18.  CMP was drawn on 03/10/18.  She will return for lab work at the end of May and every 3 months. Standing orders placed today. Future order for lipid panel placed today.  Fibromyalgia-She continues to have generalized muscle aches and muscle tenderness. She has bilateral trochanteric bursitis.  She has difficulty sleeping at night due to the discomfort she experiences.  She takes tylenol arthritis for pain relief.  She has chronic fatigue and insomnia. We discussed the importance of regular exercise and good sleep hygiene.    Primary insomnia-She has difficulty sleeping at night  Chronic fatigue-Secondary to insomnia.  Follow Up Instructions: She will follow up in 3 months.   She does not need any refills at this time.  Standing orders for CBC and CMP were placed today.   Future order for lipid panel placed.    I discussed the assessment and treatment plan with the patient. The patient was provided an opportunity to ask questions and all were answered. The patient agreed with the plan and demonstrated an understanding of the instructions.   The patient was advised to call back or seek an in-person evaluation if the symptoms worsen or if the condition fails to improve as anticipated.  I provided 25 minutes of non-face-to-face time during this encounter.  Pollyann Savoy, MD  Scribed by- Gearldine Bienenstock, PA-C

## 2018-04-19 ENCOUNTER — Encounter: Payer: Self-pay | Admitting: Rheumatology

## 2018-04-19 ENCOUNTER — Telehealth: Payer: Self-pay | Admitting: Rheumatology

## 2018-04-19 ENCOUNTER — Ambulatory Visit: Payer: BC Managed Care – PPO | Admitting: Physician Assistant

## 2018-04-19 ENCOUNTER — Telehealth (INDEPENDENT_AMBULATORY_CARE_PROVIDER_SITE_OTHER): Payer: BC Managed Care – PPO | Admitting: Rheumatology

## 2018-04-19 DIAGNOSIS — Z8711 Personal history of peptic ulcer disease: Secondary | ICD-10-CM

## 2018-04-19 DIAGNOSIS — Z8679 Personal history of other diseases of the circulatory system: Secondary | ICD-10-CM

## 2018-04-19 DIAGNOSIS — R5382 Chronic fatigue, unspecified: Secondary | ICD-10-CM

## 2018-04-19 DIAGNOSIS — Z8719 Personal history of other diseases of the digestive system: Secondary | ICD-10-CM

## 2018-04-19 DIAGNOSIS — F5101 Primary insomnia: Secondary | ICD-10-CM

## 2018-04-19 DIAGNOSIS — Z8619 Personal history of other infectious and parasitic diseases: Secondary | ICD-10-CM

## 2018-04-19 DIAGNOSIS — M0579 Rheumatoid arthritis with rheumatoid factor of multiple sites without organ or systems involvement: Secondary | ICD-10-CM | POA: Diagnosis not present

## 2018-04-19 DIAGNOSIS — M351 Other overlap syndromes: Secondary | ICD-10-CM

## 2018-04-19 DIAGNOSIS — Z8709 Personal history of other diseases of the respiratory system: Secondary | ICD-10-CM

## 2018-04-19 DIAGNOSIS — M797 Fibromyalgia: Secondary | ICD-10-CM

## 2018-04-19 DIAGNOSIS — Z8639 Personal history of other endocrine, nutritional and metabolic disease: Secondary | ICD-10-CM

## 2018-04-19 DIAGNOSIS — Z79899 Other long term (current) drug therapy: Secondary | ICD-10-CM

## 2018-04-19 NOTE — Telephone Encounter (Signed)
Patient left a voicemail stating she was returning a call to the office.   °

## 2018-04-19 NOTE — Telephone Encounter (Signed)
Spoke with patient and she states she is able to connect with Dr. Corliss Skains at 1:30 pm for her appointment as she missed her this morning.

## 2018-04-21 ENCOUNTER — Telehealth: Payer: Self-pay | Admitting: Rheumatology

## 2018-04-21 NOTE — Telephone Encounter (Signed)
-----   Message from Ellen Henri, CMA sent at 04/19/2018  2:07 PM EDT ----- Patient had a virtual visit today with Dr. Corliss Skains. Please call to schedule 3 month follow up. Thanks!

## 2018-04-21 NOTE — Telephone Encounter (Signed)
LMOM for patient to call and schedule 3 month follow-up appointment. °

## 2018-05-03 ENCOUNTER — Ambulatory Visit: Payer: BC Managed Care – PPO | Admitting: Emergency Medicine

## 2018-05-06 ENCOUNTER — Telehealth: Payer: Self-pay | Admitting: Cardiology

## 2018-05-06 NOTE — Telephone Encounter (Signed)
Called patient, advised of recent ECHO results, may have been the phone call from last week.  Patient verbalized understanding.  Was thankful for the call back.

## 2018-05-06 NOTE — Telephone Encounter (Signed)
New Message   Patient returning call would like a nurse to give her a call back

## 2018-05-18 ENCOUNTER — Telehealth: Payer: Self-pay | Admitting: Cardiology

## 2018-05-18 NOTE — Progress Notes (Signed)
Virtual Visit via Video Note   This visit type was conducted due to national recommendations for restrictions regarding the COVID-19 Pandemic (e.g. social distancing) in an effort to limit this patient's exposure and mitigate transmission in our community.  Due to her co-morbid illnesses, this patient is at least at moderate risk for complications without adequate follow up.  This format is felt to be most appropriate for this patient at this time.  All issues noted in this document were discussed and addressed.  A limited physical exam was performed with this format.  Please refer to the patient's chart for her consent to telehealth for [facility].   Evaluation Performed:  Follow-up visit  Date:  05/18/2018   ID:  Patricia Reed, DOB 04-Oct-1960, MRN [id number]  Patient Location: Home Provider Location: Home  PCP:  [place], [name], MD  Cardiologist:  No primary care provider on file.  Electrophysiologist:  None   Chief Complaint:  Chest pain   History of Present Illness:    Patricia Reed is a 58 y.o. female who I first saw for  evaluation of end organ involvement to lupus.  She was referred by Dr. Corliss Skains.   I saw her in 2014 for chest pain.  She had had a cath by another cardiology group.  This demonstrated normal coronaries.  When I saw her I ordered an echo which demonstrated NSR and an EF of 65%.  There were no valvular abnormalities.    Since I last saw her she has done well.  The patient denies any new symptoms such as chest discomfort, neck or arm discomfort. There has been no new shortness of breath, PND or orthopnea. There have been no reported palpitations, presyncope or syncope.  She has some fleeting pain rarely shooting through her chest.  However, she walks 3 times a week for an hour and she does not get any complaints.  The patient does not have symptoms concerning for COVID-19 infection (fever, chills, cough, or new shortness of breath).    Past Medical  History:  Diagnosis Date  . Allergy   . Anemia   . Asthma   . Blood transfusion without reported diagnosis   . Fibromyalgia   . GERD (gastroesophageal reflux disease)   . Heart murmur   . High cholesterol   . History of stomach ulcers   . Hypertension   . Lupus (HCC)   . MI, old   . Rheumatoid arthritis (HCC)   . Thyroid disease   . Thyroid mass   . Vertigo    Past Surgical History:  Procedure Laterality Date  . ABDOMINAL HYSTERECTOMY     done for menorrhagia, ovaries remain  . CERVICAL DISCECTOMY     plates and screws in neck  . CESAREAN SECTION    . COLONOSCOPY    . ESOPHAGEAL MANOMETRY N/A 02/12/2014   Procedure: ESOPHAGEAL MANOMETRY (EM);  Surgeon: Louis Meckel, MD;  Location: [facility];  Service: Endoscopy;  Laterality: N/A;  . ESOPHAGOGASTRODUODENOSCOPY  03/04/2011   Procedure: ESOPHAGOGASTRODUODENOSCOPY (EGD);  Surgeon: Louis Meckel, MD;  Location: [facility];  Service: Endoscopy;  Laterality: N/A;  BOTOX Injection  . exc benign breast lump    . TONSILLECTOMY    . UPPER GASTROINTESTINAL ENDOSCOPY      Prior to Admission medications   Medication Sig Start Date End Date Taking? Authorizing Provider  acetaminophen (TYLENOL) 650 MG CR tablet Take 650 mg by mouth 2 (two) times daily.    Yes  [provider]  aspirin 81 MG tablet Take 81 mg by mouth daily.   Yes [provider]  B-D INS SYR ULTRAFINE 1CC/30G 30G X 1/2" 1 ML MISC  03/05/18  Yes [provider]  diclofenac sodium (VOLTAREN) 1 % GEL Apply 3 g to 3 large joints up to 3 times daily. 05/11/17  Yes Gearldine Bienenstockale, Taylor M, PA-C  fluticasone (FLONASE) 50 MCG/ACT nasal spray Place 2 sprays into both nostrils daily. Patient taking differently: Place 2 sprays into both nostrils as needed.  02/22/17  Yes Nafziger, Kandee Keenory, NP  folic acid (FOLVITE) 1 MG tablet Take 2 tablets (2 mg total) by mouth daily. 07/09/17  Yes Deveshwar, Janalyn RouseShaili, MD  methotrexate 50 MG/2ML injection Inject 0.6 mLs (15 mg total)  into the skin once a week. 02/22/18  Yes Deveshwar, Janalyn RouseShaili, MD  pantoprazole (PROTONIX) 40 MG tablet TAKE 1 TABLET BY MOUTH EVERY DAY 04/05/18  Yes [place], [name], MD  predniSONE (DELTASONE) 5 MG tablet Take 2.5 tablets by mouth daily. 04/05/18  Yes [provider]  Tuberculin-Allergy Syringes (ALLERGY SYRINGE 1CC/27GX1/2") 27G X 1/2" 1 ML MISC Patient to use to inject MTX weekly 08/21/16  Yes Deveshwar, Janalyn RouseShaili, MD  valACYclovir (VALTREX) 500 MG tablet 1 tab bid for 3 days, start < 48 hours after onset. 05/12/17  Yes [place], [name], MD  vitamin B-12 (CYANOCOBALAMIN) 100 MCG tablet Take 100 mcg by mouth daily.   Yes [provider]  VITAMIN D, ERGOCALCIFEROL, PO Take 1 capsule by mouth daily.   Yes [provider]  XELJANZ XR 11 MG TB24 Take 1 tablet by mouth daily. 03/02/18  Yes [provider]     Allergies:   Aspirin; Hydrocodone-acetaminophen; Ibuprofen; Imdur [isosorbide nitrate]; Meperidine hcl; Morphine; Oxycodone-acetaminophen; Procaine hcl; Toprol xl [metoprolol]; and Tramadol   Social History   Tobacco Use  . Smoking status: Never Smoker  . Smokeless tobacco: Never Used  Substance Use Topics  . Alcohol use: Yes    Alcohol/week: 1.0 standard drinks    Types: 1 Glasses of wine per week    Comment: occ  . Drug use: No     Family Hx: The patient's family history includes Alcohol abuse in her father; Colon cancer in her maternal uncle; Diabetes in her maternal grandmother; Heart disease in her father; Hypertension in her father. There is no history of Colon polyps, Esophageal cancer, Kidney disease, Stomach cancer, or Rectal cancer.  ROS:   Please see the history of present illness.    As stated in the HPI and negative for all other systems.   Prior CV studies:   The following studies were reviewed today:  Echo  Labs/Other Tests and Data Reviewed:    EKG:  No ECG reviewed.  Recent Labs: 03/10/2018: ALT 13; BUN 15; Creat 0.78; Potassium 4.0;  Sodium 144 04/04/2018: Hemoglobin 11.4; Platelets 282   Recent Lipid Panel No results found for: CHOL, TRIG, HDL, CHOLHDL, LDLCALC, LDLDIRECT  Wt Readings from Last 3 Encounters:  03/21/18 212 lb (96.2 kg)  03/15/18 212 lb 4 oz (96.3 kg)  03/08/18 215 lb (97.5 kg)     Objective:    Vital Signs:  There were no vitals taken for this visit.   VITAL SIGNS:  reviewed GEN:  no acute distress EYES:  sclerae anicteric, EOMI - Extraocular Movements Intact RESPIRATORY:  normal respiratory effort, symmetric expansion NEURO:  alert and oriented x 3, no obvious focal deficit PSYCH:  normal affect  ASSESSMENT & PLAN:  CHEST PAIN:   She has atypical chest pain.  No further cardiac work-up is suggested.  DYSPNEA: This seems to be at baseline.  She is walking 3 hours a week.  There was no involvement from her lung lupus either cardiac or pulmonary that could be visualized.  No further work-up is planned.  COVID-19 Education: The signs and symptoms of COVID-19 were discussed with the patient and how to seek care for testing (follow up with PCP or arrange E-visit).  The importance of social distancing was discussed today.  Time:   Today, I have spent 16 minutes with the patient with telehealth technology discussing the above problems.     Medication Adjustments/Labs and Tests Ordered: Current medicines are reviewed at length with the patient today.  Concerns regarding medicines are outlined above.   Tests Ordered: No orders of the defined types were placed in this encounter.   Medication Changes: No orders of the defined types were placed in this encounter.   Disposition:  Follow up one year  Signed, Rollene Rotunda, MD  05/18/2018 4:36 PM    [facility]

## 2018-05-18 NOTE — Telephone Encounter (Signed)
Follow up: ° ° ° °Patient returning your call back.  °

## 2018-05-18 NOTE — Telephone Encounter (Signed)
Called x3 for pre reg °

## 2018-05-19 ENCOUNTER — Encounter: Payer: Self-pay | Admitting: Cardiology

## 2018-05-19 ENCOUNTER — Telehealth (INDEPENDENT_AMBULATORY_CARE_PROVIDER_SITE_OTHER): Payer: BC Managed Care – PPO | Admitting: Cardiology

## 2018-05-19 VITALS — Ht 64.0 in | Wt 210.0 lb

## 2018-05-19 DIAGNOSIS — R079 Chest pain, unspecified: Secondary | ICD-10-CM

## 2018-05-19 DIAGNOSIS — R0602 Shortness of breath: Secondary | ICD-10-CM | POA: Diagnosis not present

## 2018-05-19 DIAGNOSIS — Z7189 Other specified counseling: Secondary | ICD-10-CM

## 2018-05-19 NOTE — Patient Instructions (Signed)

## 2018-05-24 NOTE — Progress Notes (Signed)
Virtual Visit via Video Note  I connected with Patricia Reed on 05/24/18 at  8:15 AM EDT by a video enabled telemedicine application and verified that I am speaking with the correct person using two identifiers.  Location: Patient: Home Provider: Clinic   This service was conducted via virtual visit.  Both audio and visual tools were used.  The patient was located at home. I was located in my office.  Consent was obtained prior to the virtual visit and is aware of possible charges through their insurance for this visit.  The patient is an established patient.  Dr. Corliss Skains, MD conducted the virtual visit and Sherron Ales, PA-C acted as scribe during the service.  Office staff helped with scheduling follow up visits after the service was conducted.   I discussed the limitations of evaluation and management by telemedicine and the availability of in person appointments. The patient expressed understanding and agreed to proceed.   CC: Muscle tenderness   History of Present Illness: Patient is a 58 year old female with a past medical history of mixed connective tissue disease, rheumatoid arthritis, and fibromyalgia. She is on MTX 0.6 ml sq q wk, folic acid 2mg  po qd, and Xeljanz 11 mg XR. She is experiencing muscle tenderness and muscle aches in her lower extremities, trapezius muscles, and both shoulders due to fibromyalgia. She states the muscle pain started 1 week ago. She states it feels as though she had a strenuous workout but she has not. She has been having difficulty getting out of chair without assistance. She denies any joint pain or joint swelling.  She denies any recent rheumatoid arthritis flares.  She has occasional oral ulcerations, and she uses a mouthwash which heals the sores. The last episode was 1 month ago.  She denies any recent rashes.   Review of Systems  Constitutional: Positive for malaise/fatigue. Negative for fever.  Eyes: Negative for photophobia, pain, discharge  and redness.  Respiratory: Negative for cough, shortness of breath and wheezing.   Cardiovascular: Negative for chest pain and palpitations.  Gastrointestinal: Negative for blood in stool, constipation and diarrhea.  Genitourinary: Negative for dysuria.  Musculoskeletal: Positive for myalgias. Negative for back pain, joint pain and neck pain.  Skin: Negative for rash.  Neurological: Negative for dizziness and headaches.  Psychiatric/Behavioral: Negative for depression. The patient has insomnia. The patient is not nervous/anxious.       Observations/Objective: Physical Exam  Constitutional: She is oriented to person, place, and time and well-developed, well-nourished, and in no distress.  HENT:  Head: Normocephalic and atraumatic.  Eyes: Conjunctivae are normal.  Pulmonary/Chest: Effort normal.  Neurological: She is alert and oriented to person, place, and time.  Psychiatric: Mood, memory, affect and judgment normal.   Patient reports morning stiffness for 0 minutes.   Patient reports nocturnal pain.   Difficulty dressing/grooming: Denies Difficulty climbing stairs: Denies Difficulty getting out of chair: Reports Difficulty using hands for taps, buttons, cutlery, and/or writing: Denies   She has difficulty getting up from a chair.  She has muscle tenderness of both quadriceps, trapezius, and deltoids.   Assessment and Plan: Mixed connective tissue disease (HCC)- +ANA,+Sm,+RNP,+RF, arthritis. She was started on long term low dose prednisone 2.5 mg po daily by Dr. Verdon Cummins at the end of March 2020.  She is clinically doing better on MTX 0.6 ml sq once weekly, folic acid 2 mg po daily, and Xeljanz 11 mg XR combination therapy.  She has occasional oral ulcerations but currently has  no oral or nasal ulcerations.  She has not had any recent rashes.  She has no joint pain or joint swelling at this time.  She has been experiencing increased myalgias. We will check CK, sed rate, and TSH.  Future  orders were placed today.  She is also due to update autoimmune lab work. She was advised to notify us if she develops new or worsening symptoms. She will follow up in 1 month.      Rheumatoid arthritis involving multiple sites with positive rheumatoid factor (HCC)-She has not had any recent rheumatoid arthritis flares.  She has no joint pain or joint swelling.  She has no morning stiffness.  She was started on low dose prednisone 2.5 mg po daily in March 2020, and she continues to inject MTX 0.6 ml sq once weekly, takes folic acid 2 mg po daily, and Xeljanz 11 mg XR.  She will continue on this current treatment regimen.  She does not need any refills.  She was advised to notify us if she develops increased joint pain or joint swelling.     High risk medication use - MTX 0.6 ml sq q wk, folic acid 2mg  po qd, Xeljanz 11 mg XR.  CBC and CMP were drawn on 03/10/18.  She is due to update CBC and CMP in May and every 3 months.  Future orders are in place.  She was advised to hold Xeljanz and MTX if she develops an infection and resume once the infection has cleared. We discussed the importance of social distancing and following the standard precautions recommended by the CDC.    Fibromyalgia-She is currently having muscle aches and muscle tenderness in bilateral lower extremities, trapezius muscles, and deltoids.  She has no muscle weakness.  She has some difficulty getting up from a chair without assistance but no difficulty raising her arms above her head.  She has chronic fatigue related to insomnia.  She has difficulty sleeping at night due to the discomfort she has been experiencing.  We will check CK, sed rate, and TSH.  Future orders were placed today.    Primary insomnia-She has difficulty sleeping at night.    Chronic fatigue-Secondary to insomnia.  Follow Up Instructions: She will follow up in 1 month.    I discussed the assessment and treatment plan with the patient. The patient was  provided an opportunity to ask questions and all were answered. The patient agreed with the plan and demonstrated an understanding of the instructions.   The patient was advised to call back or seek an in-person evaluation if the symptoms worsen or if the condition fails to improve as anticipated.  I provided 25 minutes of non-face-to-face time during this encounter. Pollyann Savoy, MD   Scribed by-  Gearldine Bienenstock, PA-C

## 2018-05-25 ENCOUNTER — Encounter: Payer: Self-pay | Admitting: Rheumatology

## 2018-05-25 ENCOUNTER — Telehealth: Payer: Self-pay | Admitting: Rheumatology

## 2018-05-25 ENCOUNTER — Other Ambulatory Visit: Payer: Self-pay | Admitting: *Deleted

## 2018-05-25 ENCOUNTER — Telehealth (INDEPENDENT_AMBULATORY_CARE_PROVIDER_SITE_OTHER): Payer: BC Managed Care – PPO | Admitting: Rheumatology

## 2018-05-25 DIAGNOSIS — F5101 Primary insomnia: Secondary | ICD-10-CM

## 2018-05-25 DIAGNOSIS — Z8711 Personal history of peptic ulcer disease: Secondary | ICD-10-CM

## 2018-05-25 DIAGNOSIS — Z8719 Personal history of other diseases of the digestive system: Secondary | ICD-10-CM

## 2018-05-25 DIAGNOSIS — R5382 Chronic fatigue, unspecified: Secondary | ICD-10-CM

## 2018-05-25 DIAGNOSIS — Z8639 Personal history of other endocrine, nutritional and metabolic disease: Secondary | ICD-10-CM

## 2018-05-25 DIAGNOSIS — M351 Other overlap syndromes: Secondary | ICD-10-CM

## 2018-05-25 DIAGNOSIS — M797 Fibromyalgia: Secondary | ICD-10-CM

## 2018-05-25 DIAGNOSIS — M0579 Rheumatoid arthritis with rheumatoid factor of multiple sites without organ or systems involvement: Secondary | ICD-10-CM

## 2018-05-25 DIAGNOSIS — M791 Myalgia, unspecified site: Secondary | ICD-10-CM

## 2018-05-25 DIAGNOSIS — M7062 Trochanteric bursitis, left hip: Secondary | ICD-10-CM

## 2018-05-25 DIAGNOSIS — Z79899 Other long term (current) drug therapy: Secondary | ICD-10-CM

## 2018-05-25 DIAGNOSIS — Z8679 Personal history of other diseases of the circulatory system: Secondary | ICD-10-CM

## 2018-05-25 DIAGNOSIS — Z8709 Personal history of other diseases of the respiratory system: Secondary | ICD-10-CM

## 2018-05-25 DIAGNOSIS — M7061 Trochanteric bursitis, right hip: Secondary | ICD-10-CM

## 2018-05-25 NOTE — Telephone Encounter (Signed)
Labs Orders released.  

## 2018-05-25 NOTE — Telephone Encounter (Signed)
A representative from [employer] on [place] left a voicemail stating patient came by for [employer], but no orders were in the system.  They requested the orders be sent and they will call the patient back to schedule an appointment.

## 2018-05-27 LAB — URINALYSIS, ROUTINE W REFLEX MICROSCOPIC
Bilirubin Urine: NEGATIVE
Glucose, UA: NEGATIVE
Hgb urine dipstick: NEGATIVE
Ketones, ur: NEGATIVE
Leukocytes,Ua: NEGATIVE
Nitrite: NEGATIVE
Protein, ur: NEGATIVE
Specific Gravity, Urine: 1.033 (ref 1.001–1.03)
pH: 5.5 (ref 5.0–8.0)

## 2018-05-27 LAB — CBC WITH DIFFERENTIAL/PLATELET
Absolute Monocytes: 281 cells/uL (ref 200–950)
Basophils Absolute: 51 cells/uL (ref 0–200)
Basophils Relative: 1 %
Eosinophils Absolute: 556 cells/uL — ABNORMAL HIGH (ref 15–500)
Eosinophils Relative: 10.9 %
HCT: 35.7 % (ref 35.0–45.0)
Hemoglobin: 11.8 g/dL (ref 11.7–15.5)
Lymphs Abs: 734 cells/uL — ABNORMAL LOW (ref 850–3900)
MCH: 29.1 pg (ref 27.0–33.0)
MCHC: 33.1 g/dL (ref 32.0–36.0)
MCV: 87.9 fL (ref 80.0–100.0)
MPV: 10.7 fL (ref 7.5–12.5)
Monocytes Relative: 5.5 %
Neutro Abs: 3478 cells/uL (ref 1500–7800)
Neutrophils Relative %: 68.2 %
Platelets: 321 10*3/uL (ref 140–400)
RBC: 4.06 10*6/uL (ref 3.80–5.10)
RDW: 14.5 % (ref 11.0–15.0)
Total Lymphocyte: 14.4 %
WBC: 5.1 10*3/uL (ref 3.8–10.8)

## 2018-05-27 LAB — TSH: TSH: 0.59 mIU/L (ref 0.40–4.50)

## 2018-05-27 LAB — COMPLETE METABOLIC PANEL WITH GFR
AG Ratio: 1.3 (calc) (ref 1.0–2.5)
ALT: 13 U/L (ref 6–29)
AST: 17 U/L (ref 10–35)
Albumin: 4 g/dL (ref 3.6–5.1)
Alkaline phosphatase (APISO): 67 U/L (ref 37–153)
BUN: 14 mg/dL (ref 7–25)
CO2: 31 mmol/L (ref 20–32)
Calcium: 9 mg/dL (ref 8.6–10.4)
Chloride: 104 mmol/L (ref 98–110)
Creat: 0.71 mg/dL (ref 0.50–1.05)
GFR, Est African American: 110 mL/min/{1.73_m2} (ref >=60)
GFR, Est Non African American: 95 mL/min/{1.73_m2} (ref >=60)
Globulin: 3.2 g/dL (calc) (ref 1.9–3.7)
Glucose, Bld: 77 mg/dL (ref 65–139)
Potassium: 4.1 mmol/L (ref 3.5–5.3)
Sodium: 141 mmol/L (ref 135–146)
Total Bilirubin: 0.6 mg/dL (ref 0.2–1.2)
Total Protein: 7.2 g/dL (ref 6.1–8.1)

## 2018-05-27 LAB — LIPID PANEL
Cholesterol: 205 mg/dL — ABNORMAL HIGH (ref <200)
HDL: 62 mg/dL (ref >=50)
LDL Cholesterol (Calc): 121 mg/dL (calc) — ABNORMAL HIGH
Non-HDL Cholesterol (Calc): 143 mg/dL (calc) — ABNORMAL HIGH (ref <130)
Total CHOL/HDL Ratio: 3.3 (calc) (ref <5.0)
Triglycerides: 111 mg/dL (ref <150)

## 2018-05-27 LAB — CK: Total CK: 72 U/L (ref 29–143)

## 2018-05-27 LAB — ANTI-DNA ANTIBODY, DOUBLE-STRANDED: ds DNA Ab: 1 IU/mL

## 2018-05-27 LAB — C3 AND C4
C3 Complement: 180 mg/dL (ref 83–193)
C4 Complement: 40 mg/dL (ref 15–57)

## 2018-05-27 LAB — SEDIMENTATION RATE: Sed Rate: 46 mm/h — ABNORMAL HIGH (ref 0–30)

## 2018-05-27 NOTE — Progress Notes (Signed)
DsDNA is 1-does not reveal sign of flare.

## 2018-05-27 NOTE — Progress Notes (Signed)
TSH WNL. Complements WNL. UA WNL.  CMP WNL.  CBC stable. Sed rate is elevated.  Discussed with Dr. Corliss Skains.  We will continue to monitor.  It is reassuring that her CK is WNL. LDL is elevated.  She is taking [name] as prescribed.  Please notify patient and forward labs to PCP for further recommendations.

## 2018-06-03 ENCOUNTER — Telehealth: Payer: Self-pay | Admitting: Rheumatology

## 2018-06-03 NOTE — Telephone Encounter (Signed)
-----   Message from Henriette Combs, LPN sent at 07/22/5327  1:32 PM EDT ----- Please schedule a follow up visit in 1 month. Patient was seen for a telemedicine visit. Thanks!

## 2018-06-03 NOTE — Telephone Encounter (Signed)
LMOM for patient to call and schedule 1 month follow-up appointment. 

## 2018-06-07 ENCOUNTER — Telehealth: Payer: Self-pay | Admitting: Rheumatology

## 2018-06-07 NOTE — Telephone Encounter (Signed)
I LMOM for patient to call ,and schedule her next 1 month follow up with Sherron Ales, PAC. (around 06/24/2018)

## 2018-06-07 NOTE — Telephone Encounter (Signed)
-----   Message from Andrea L Hatton, LPN sent at 05/25/2018  1:32 PM EDT ----- °Please schedule a follow up visit in 1 month. Patient was seen for a telemedicine visit. Thanks! ° °

## 2018-07-13 ENCOUNTER — Other Ambulatory Visit: Payer: Self-pay | Admitting: Rheumatology

## 2018-07-13 NOTE — Telephone Encounter (Signed)
Last Visit: 04/19/18 Next Visit: 07/19/18  Okay to refill per Dr. Estanislado Pandy

## 2018-07-14 NOTE — Progress Notes (Deleted)
Office Visit Note  [name]             Date of Birth: 23-Jan-1960           MRN: [id number]             PCP: [place], [name], MD Referring: [place], [name], MD Visit Date: 07/19/2018 Occupation: @GUAROCC @  Subjective:  No chief complaint on file.  XR prescribed by Dr. [name] at [facility]?  No TB gold.  Xeljanz XR 11 mg daily, methotrexate 0.6 mL every 7 days and folic acid 1 mg 2 tablets daily. No TB gold on file.  Tb gold ordered for today and will monitor yearly.  Last lipid panel showed elevated LDL on 05/26/2018 and will monitor yearly. Most recent CBC/CMP within normal limits on 05/26/2018.  Due for CBC/CMP in August and will monitor every 3 months. Standing orders are in place. She received the flu vaccine in September and previously Shingrix vaccines.  Recommend Prevnar 13 and Pneumovax 23 as indicated for immunosuppressant therapy.  History of Present Illness: Patricia Reed is a 58 y.o. female ***   Activities of Daily Living:  Patient reports morning stiffness for *** {minute/hour:19697}.   Patient {ACTIONS;DENIES/REPORTS:21021675::"Denies"} nocturnal pain.  Difficulty dressing/grooming: {ACTIONS;DENIES/REPORTS:21021675::"Denies"} Difficulty climbing stairs: {ACTIONS;DENIES/REPORTS:21021675::"Denies"} Difficulty getting out of chair: {ACTIONS;DENIES/REPORTS:21021675::"Denies"} Difficulty using hands for taps, buttons, cutlery, and/or writing: {ACTIONS;DENIES/REPORTS:21021675::"Denies"}  No Rheumatology ROS completed.   PMFS History:  Patient Active Problem List   Diagnosis Date Noted  . Mixed connective tissue disease (HCC) 08/14/2017  . Recurrent genital herpes 05/12/2017  . Shortness of breath 04/15/2017  . Dizziness 02/04/2017  . Situational anxiety 06/08/2016  . Atypical chest pain 06/08/2016  . Trapezius muscle spasm 04/23/2016  . Fibromyalgia 02/13/2016  . 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 Chronic fatigue 02/13/2016  . Vitamin D deficiency  11/25/2015  . Autoimmune disease (HCC) 11/23/2015  . High risk medication use 11/23/2015  . Colon cancer screening 01/25/2014  . Anterior neck pain 12/01/2013  . Inflammatory arthritis 08/18/2012  . Allergic rhinitis 06/23/2012  . HTN (hypertension) 06/23/2012  . High cholesterol 06/23/2012  . Thyroid nodule - benign 06/23/2012  . Arthralgia 06/23/2012  . Mild intermittent asthma   . Sickle cell trait (HCC)   . History of stomach ulcers   . ESOPHAGEAL STRICTURE 10/04/2007  . GERD 10/04/2007  . 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 Dysphagia, pharyngoesophageal phase 10/04/2007    Past Medical History:  Diagnosis Date  . Allergy   . Anemia   . Asthma   . Blood transfusion without reported diagnosis   . Fibromyalgia   . GERD (gastroesophageal reflux disease)   . Heart murmur   . High cholesterol   . History of stomach ulcers   . Hypertension   . Lupus (HCC)   . MI, old   . Rheumatoid arthritis (HCC)   . Thyroid disease   . Thyroid mass   . Vertigo     Family History  Problem Relation Age of Onset  . Alcohol abuse Father   . Hypertension Father   . Heart disease Father   . Colon cancer Maternal Uncle   . Diabetes Maternal Grandmother   . Colon polyps Neg Hx   . Esophageal cancer Neg Hx   . Kidney disease Neg Hx   . Stomach cancer Neg Hx   . Rectal cancer Neg Hx    Past Surgical History:  Procedure Laterality Date  . ABDOMINAL HYSTERECTOMY  done for menorrhagia, ovaries remain  . CERVICAL DISCECTOMY     plates and screws in neck  . CESAREAN SECTION    . COLONOSCOPY    . ESOPHAGEAL MANOMETRY N/A 02/12/2014   Procedure: ESOPHAGEAL MANOMETRY (EM);  Surgeon: Inda Castle, MD;  Location: [facility];  Service: Endoscopy;  Laterality: N/A;  . ESOPHAGOGASTRODUODENOSCOPY  03/04/2011   Procedure: ESOPHAGOGASTRODUODENOSCOPY (EGD);  Surgeon: Inda Castle, MD;  Location: [facility];  Service: Endoscopy;  Laterality: N/A;  BOTOX Injection  . exc benign breast lump     . TONSILLECTOMY    . 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     Immunization History  Administered Date(s) Administered  . Influenza, Quadrivalent, Recombinant, Inj, Pf 10/11/2017  . Influenza,inj,Quad PF,6+ Mos 11/04/2015  . Zoster Recombinat (Shingrix) 10/11/2017, 12/25/2017     Objective: Vital Signs: There were no vitals taken for this visit.   Physical Exam   Musculoskeletal Exam: ***  CDAI Exam: CDAI Score: - Patient Global: -; Provider Global: - Swollen: -; Tender: - Joint Exam   No joint exam has been documented for this visit   There is currently no information documented on the homunculus. Go to the Rheumatology activity and complete the homunculus joint exam.  Investigation: No additional findings.  Imaging: No results found.  Recent Labs: Lab Results  Component Value Date   WBC 5.1 05/26/2018   HGB 11.8 05/26/2018   PLT 321 05/26/2018   NA 141 05/26/2018   K 4.1 05/26/2018   CL 104 05/26/2018   CO2 31 05/26/2018   GLUCOSE 77 05/26/2018   BUN 14 05/26/2018   CREATININE 0.71 05/26/2018   BILITOT 0.6 05/26/2018   ALKPHOS 58 09/18/2017   AST 17 05/26/2018   ALT 13 05/26/2018   PROT 7.2 05/26/2018   ALBUMIN 4.0 09/18/2017   CALCIUM 9.0 05/26/2018   GFRAA 110 05/26/2018    Speciality Comments: No specialty comments available.  Procedures:  No procedures performed Allergies: Aspirin, Hydrocodone-acetaminophen, Ibuprofen, Imdur [isosorbide nitrate], Meperidine hcl, Morphine, Oxycodone-acetaminophen, Procaine hcl, Toprol xl [metoprolol], and Tramadol   Assessment / Plan:     Visit Diagnoses: No diagnosis found.   Orders: No orders of the defined types were placed in this encounter.  No orders of the defined types were placed in this encounter.   Face-to-face time spent with patient was *** minutes. Greater than 50% of  time was spent in counseling and coordination of care.  Follow-Up Instructions: No follow-ups on file.   Ofilia Neas, PA-C  Note - This record has been created using Dragon software.  Chart creation errors have been sought, but may not always  have been located. Such creation errors do not reflect on  the standard of medical care.

## 2018-07-19 ENCOUNTER — Ambulatory Visit: Payer: BC Managed Care – PPO | Admitting: Rheumatology

## 2018-07-26 ENCOUNTER — Encounter: Payer: Self-pay | Admitting: Rheumatology

## 2018-07-26 ENCOUNTER — Other Ambulatory Visit: Payer: Self-pay

## 2018-07-26 ENCOUNTER — Ambulatory Visit: Payer: BC Managed Care – PPO | Admitting: Rheumatology

## 2018-07-26 VITALS — BP 161/98 | HR 70 | Resp 14 | Ht 65.0 in | Wt 215.8 lb

## 2018-07-26 DIAGNOSIS — M351 Other overlap syndromes: Secondary | ICD-10-CM | POA: Diagnosis not present

## 2018-07-26 DIAGNOSIS — M0579 Rheumatoid arthritis with rheumatoid factor of multiple sites without organ or systems involvement: Secondary | ICD-10-CM

## 2018-07-26 DIAGNOSIS — Z79899 Other long term (current) drug therapy: Secondary | ICD-10-CM

## 2018-07-26 DIAGNOSIS — Z8709 Personal history of other diseases of the respiratory system: Secondary | ICD-10-CM

## 2018-07-26 DIAGNOSIS — R5382 Chronic fatigue, unspecified: Secondary | ICD-10-CM

## 2018-07-26 DIAGNOSIS — M797 Fibromyalgia: Secondary | ICD-10-CM

## 2018-07-26 DIAGNOSIS — Z8719 Personal history of other diseases of the digestive system: Secondary | ICD-10-CM

## 2018-07-26 DIAGNOSIS — F5101 Primary insomnia: Secondary | ICD-10-CM

## 2018-07-26 DIAGNOSIS — Z8711 Personal history of peptic ulcer disease: Secondary | ICD-10-CM

## 2018-07-26 DIAGNOSIS — Z8679 Personal history of other diseases of the circulatory system: Secondary | ICD-10-CM

## 2018-07-26 DIAGNOSIS — Z8639 Personal history of other endocrine, nutritional and metabolic disease: Secondary | ICD-10-CM

## 2018-07-26 NOTE — Progress Notes (Signed)
Office Visit Note  [name]             Date of Birth: 05-Jan-1961           MRN: [id number]             PCP: [place], [name], MD Referring: [place], [name], MD Visit Date: 07/26/2018 Occupation: @GUAROCC @  Subjective:  Medication monitoring.   History of Present Illness: Patricia Reed is a 58 y.o. female with history of mixed connective tissue disease and rheumatoid arthritis.  She states she has been taking methotrexate, 59 on a regular basis.  She is a still on prednisone 2.5 mg every day.  She states she is doing really well and has had not had any joint pain or joint swelling.  She has been walking on a regular basis.  She believes the joint symptoms have improved as she has not been to work and had been at home and doing regular exercise.  She spaces despite of that she has not been sleeping well.   Activities of Daily Living:  Patient reports morning stiffness for 0 minute.   Patient Denies nocturnal pain.  Difficulty dressing/grooming: Denies Difficulty climbing stairs: Denies Difficulty getting out of chair: Denies Difficulty using hands for taps, buttons, cutlery, and/or writing: Denies  Review of Systems  Constitutional: Positive for weight gain. Negative for fatigue, night sweats and weight loss.       10 lbs in 4 months  HENT: Negative for mouth sores, trouble swallowing, trouble swallowing, mouth dryness and nose dryness.   Eyes: Negative for pain, redness, visual disturbance and dryness.  Respiratory: Negative for cough, shortness of breath and difficulty breathing.   Cardiovascular: Negative for chest pain, palpitations, hypertension, irregular heartbeat and swelling in legs/feet.  Gastrointestinal: Negative for blood in stool, constipation and diarrhea.  Endocrine: Negative for increased urination.  Genitourinary: Negative for vaginal dryness.  Musculoskeletal: Negative for arthralgias, joint pain, joint swelling, myalgias, muscle  weakness, morning stiffness, muscle tenderness and myalgias.  Skin: Negative for color change, rash, hair loss, skin tightness, ulcers and sensitivity to sunlight.  Allergic/Immunologic: Negative for susceptible to infections.  Neurological: Negative for dizziness, memory loss, night sweats and weakness.  Hematological: Negative for swollen glands.  Psychiatric/Behavioral: Positive for sleep disturbance. Negative for depressed mood. The patient is not nervous/anxious.     PMFS History:  Patient Active Problem List   Diagnosis Date Noted  . Mixed connective tissue disease (HCC) 08/14/2017  . Recurrent genital herpes 05/12/2017  . Shortness of breath 04/15/2017  . Dizziness 02/04/2017  . Situational anxiety 06/08/2016  . Atypical chest pain 06/08/2016  . Trapezius muscle spasm 04/23/2016  . Fibromyalgia 02/13/2016  . 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 Chronic fatigue 02/13/2016  . Vitamin D deficiency 11/25/2015  . Autoimmune disease (HCC) 11/23/2015  . High risk medication use 11/23/2015  . Colon cancer screening 01/25/2014  . Anterior neck pain 12/01/2013  . Inflammatory arthritis 08/18/2012  . Allergic rhinitis 06/23/2012  . HTN (hypertension) 06/23/2012  . High cholesterol 06/23/2012  . Thyroid nodule - benign 06/23/2012  . Arthralgia 06/23/2012  . Mild intermittent asthma   . Sickle cell trait (HCC)   . History of stomach ulcers   . ESOPHAGEAL STRICTURE 10/04/2007  . GERD 10/04/2007  . 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 Dysphagia, pharyngoesophageal phase 10/04/2007    Past Medical History:  Diagnosis Date  . Allergy   . Anemia   . Asthma   . Blood transfusion  without reported diagnosis   . Fibromyalgia   . GERD (gastroesophageal reflux disease)   . Heart murmur   . High cholesterol   . History of stomach ulcers   . Hypertension   . Lupus ([place])   . MI, old   . Rheumatoid arthritis ([facility])   . Thyroid disease   . Thyroid mass   . Vertigo     Family History   Problem Relation Age of Onset  . Alcohol abuse Father   . Hypertension Father   . Heart disease Father   . Colon cancer Maternal Uncle   . Diabetes Maternal Grandmother   . Colon polyps Neg Hx   . Esophageal cancer Neg Hx   . Kidney disease Neg Hx   . Stomach cancer Neg Hx   . Rectal cancer Neg Hx    Past Surgical History:  Procedure Laterality Date  . ABDOMINAL HYSTERECTOMY     done for menorrhagia, ovaries remain  . CERVICAL DISCECTOMY     plates and screws in neck  . CESAREAN SECTION    . COLONOSCOPY    . ESOPHAGEAL MANOMETRY N/A 02/12/2014   Procedure: ESOPHAGEAL MANOMETRY (EM);  Surgeon: Inda Castle, MD;  Location: [facility];  Service: Endoscopy;  Laterality: N/A;  . ESOPHAGOGASTRODUODENOSCOPY  03/04/2011   Procedure: ESOPHAGOGASTRODUODENOSCOPY (EGD);  Surgeon: Inda Castle, MD;  Location: [facility];  Service: Endoscopy;  Laterality: N/A;  BOTOX Injection  . exc benign breast lump    . TONSILLECTOMY    . 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     Immunization History  Administered Date(s) Administered  . Influenza, Quadrivalent, Recombinant, Inj, Pf 10/11/2017  . Influenza,inj,Quad PF,6+ Mos 11/04/2015  . Zoster Recombinat (Shingrix) 10/11/2017, 12/25/2017     Objective: Vital Signs: BP (!) 161/98 (BP Location: Left Arm, Patient Position: Sitting, Cuff Size: Normal)   Pulse 70   Resp 14   Ht 5\' 5"  (1.651 m)   Wt 215 lb 12.8 oz (97.9 kg)   BMI 35.91 kg/m    Physical Exam Vitals signs and nursing note reviewed.  Constitutional:      Appearance: She is well-developed.  HENT:     Head: Normocephalic and atraumatic.  Eyes:     Conjunctiva/sclera: Conjunctivae normal.  Neck:     Musculoskeletal: Normal range of motion.  Cardiovascular:     Rate and Rhythm: Normal rate and regular rhythm.     Heart sounds: Normal heart sounds.   Pulmonary:     Effort: Pulmonary effort is normal.     Breath sounds: Normal breath sounds.  Abdominal:     General: Bowel sounds are normal.     Palpations: Abdomen is soft.  Lymphadenopathy:     Cervical: No cervical adenopathy.  Skin:    General: Skin is warm and dry.     Capillary Refill: Capillary refill takes less than 2 seconds.  Neurological:     Mental Status: She is alert and oriented to person, place, and time.  Psychiatric:        Behavior: Behavior normal.      Musculoskeletal Exam: C-spine thoracic and lumbar spine with good range of motion.  Shoulder joints elbow joints wrist joints with good range of motion.  She has some synovial thickening over MCPs and PIPs with no synovitis.  Hip joints, knee joints, ankles, MTPs PIPs and DIPs  with good range of motion with no synovitis.   CDAI Exam: CDAI Score: 0.4  Patient Global: 2 mm; Provider Global: 2 mm Swollen: 0 ; Tender: 0  Joint Exam   No joint exam has been documented for this visit   There is currently no information documented on the homunculus. Go to the Rheumatology activity and complete the homunculus joint exam.  Investigation: No additional findings.  Imaging: No results found.  Recent Labs: Lab Results  Component Value Date   WBC 5.1 05/26/2018   HGB 11.8 05/26/2018   PLT 321 05/26/2018   NA 141 05/26/2018   K 4.1 05/26/2018   CL 104 05/26/2018   CO2 31 05/26/2018   GLUCOSE 77 05/26/2018   BUN 14 05/26/2018   CREATININE 0.71 05/26/2018   BILITOT 0.6 05/26/2018   ALKPHOS 58 09/18/2017   AST 17 05/26/2018   ALT 13 05/26/2018   PROT 7.2 05/26/2018   ALBUMIN 4.0 09/18/2017   CALCIUM 9.0 05/26/2018   GFRAA 110 05/26/2018    Speciality Comments: No specialty comments available.  Procedures:  No procedures performed Allergies: Aspirin, Hydrocodone-acetaminophen, Ibuprofen, Imdur [isosorbide nitrate], Meperidine hcl, Morphine, Oxycodone-acetaminophen, Procaine hcl, Toprol xl  [metoprolol], and Tramadol   Assessment / Plan:     Visit Diagnoses:  Mixed connective tissue disease-+ANA,+Sm,+RNP,+RF, arthritis.  Patient is clinically doing well.  Her last serology showed normal complements but the sed rate was elevated.  She states she gets occasional oral ulcers which she has not had a long time.  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  Rheumatoid arthritis with positive rheumatoid factor-she is clinically doing well with no synovitis on examination.  I will check rheumatoid factor and anti-CCP today.  High risk medication use-She is on methotrexate 0.6 mL subcu weekly, folic acid 2 mg p.o. daily and Xeljanz XR 11 mg p.o. daily.  Prednisone 2.5 mg p.o. daily.  Her labs have been stable.  I have advised her to reduce prednisone to 1 tablet every other day.  If she has no flares then we may be able to discontinue that in the future.  I will check TB Gold today.  Fibromyalgia-she is currently not having much discomfort.  She states she is been trying to walk on regular basis.  Although she has gained 10 pounds in the last 4 months.  Chronic fatigue-related to insomnia  Insomnia-she continues to have insomnia.  Good sleep hygiene was discussed.  History of vitamin D deficiency-her last vitamin D levels was normal.  She has been taking supplements.  Other medical problems include history of hypertension, hyperlipidemia, history of peptic ulcer disease, reflux, asthma.  Orders: Orders Placed This Encounter  Procedures  . QuantiFERON-TB Gold Plus  . Sedimentation rate  . Cyclic citrul peptide antibody, IgG  . Rheumatoid factor   No orders of the defined types were placed in this encounter.   .  Follow-Up Instructions: Return in about 5 months (around 12/26/2018) for MCTD, Rheumatoid arthritis.   Pollyann Savoy, MD  Note - This record has been created using Animal nutritionist.  Chart creation errors have been  sought, but may not always  have been located. Such creation errors do not reflect on  the standard of medical care.

## 2018-07-28 LAB — QUANTIFERON-TB GOLD PLUS
Mitogen-NIL: >10 IU/mL
NIL: 0.02 IU/mL
QuantiFERON-TB Gold Plus: NEGATIVE
TB1-NIL: 0 IU/mL
TB2-NIL: 0 IU/mL

## 2018-07-28 LAB — SEDIMENTATION RATE: Sed Rate: 34 mm/h — ABNORMAL HIGH (ref 0–30)

## 2018-07-28 LAB — RHEUMATOID FACTOR: Rheumatoid fact SerPl-aCnc: 46 IU/mL — ABNORMAL HIGH (ref <14)

## 2018-07-28 LAB — CYCLIC CITRUL PEPTIDE ANTIBODY, IGG: Cyclic Citrullin Peptide Ab: <16 UNITS

## 2018-07-29 NOTE — Progress Notes (Signed)
Labs are stable.

## 2018-08-18 NOTE — Progress Notes (Addendum)
@Patient  ID: , female    DOB: 12-Oct-1960, 58 y.o.   MRN: [id number]  No chief complaint on file.   Referring provider: [id number], Patricia G, MD  HPI: 58 year old female, never smoked. 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 Patient of Dr. 59, last seen on 04/28/17. Maintained on Plaquenil and methotrexate. PFTs showed normal airflow without significant bronchodilator response, mild restriction and slight decreased diffusion capacity. No maintenance bronchodilator or ICS. Needs annual CXR d/t autoimmune disease. If imaging or symptoms change in any way needs CT scan.   08/19/2018 Patient presents today for annual follow-up. She is doing really well. No breathing issues. Humidity does worsen her shortness of breath but she has not had to use her rescue inhaler. Since COVID she has been out of work and her Lupus and asthma symptoms have been well controlled. Saw rheumatology on 7/15, continues 1/2 tab prednisone and methotrexate weekly injections. Due for CXR today.    Allergies  Allergen Reactions  . Aspirin Other (See Comments)    GI bleed  . Hydrocodone-Acetaminophen Hives and Nausea And Vomiting  . Ibuprofen Other (See Comments)    GI bleed  . Imdur [Isosorbide Nitrate] Other (See Comments)    Reaction unknown  . Meperidine Hcl Hives and Nausea And Vomiting    Short term memory loss  . Morphine Hives and Nausea And Vomiting  . Oxycodone-Acetaminophen Hives and Nausea And Vomiting    Reaction unknown  . Procaine Hcl Other (See Comments)    Ineffective  . Toprol Xl [Metoprolol] Other (See Comments)    Reaction unknown  . Tramadol Itching    Immunization History  Administered Date(s) Administered  . Influenza, Quadrivalent, Recombinant, Inj, Pf 10/11/2017  . Influenza,inj,Quad PF,6+ Mos 11/04/2015  . Zoster Recombinat (Shingrix) 10/11/2017,  12/25/2017    Past Medical History:  Diagnosis Date  . Allergy   . Anemia   . Asthma   . Blood transfusion without reported diagnosis   . Fibromyalgia   . GERD (gastroesophageal reflux disease)   . Heart murmur   . High cholesterol   . History of stomach ulcers   . Hypertension   . Lupus (HCC)   . MI, old   . Rheumatoid arthritis (HCC)   . Thyroid disease   . Thyroid mass   . Vertigo     Tobacco History: Social History   Tobacco Use  Smoking Status Never Smoker  Smokeless Tobacco Never Used   Counseling given: Not Answered   Outpatient Medications Prior to Visit  Medication Sig Dispense Refill  . acetaminophen (TYLENOL) 650 MG CR tablet Take 650 mg by mouth 2 (two) times daily.     14/07/2017 aspirin 81 MG tablet Take 81 mg by mouth daily.    . B-D INS SYR ULTRAFINE 1CC/30G 30G X 1/2" 1 ML MISC     . diclofenac sodium (VOLTAREN) 1 % GEL Apply 3 Reed to 3 large joints up to 3 times daily. 3 Tube 3  . fluticasone (FLONASE) 50 MCG/ACT nasal spray Place 2 sprays into both nostrils daily. (Patient taking differently: Place 2 sprays into both nostrils as needed. ) 16 Reed 6  . folic acid (FOLVITE) 1 MG tablet TAKE 2 TABLETS (2 MG TOTAL) BY MOUTH DAILY. 180 tablet 3  . methotrexate 50 MG/2ML injection Inject 0.6 mLs (15 mg total) into the skin once a week. 8 mL  0  . pantoprazole (PROTONIX) 40 MG tablet TAKE 1 TABLET BY MOUTH EVERY DAY 90 tablet 1  . predniSONE (DELTASONE) 5 MG tablet Take 2.5 tablets by mouth daily.    . Tuberculin-Allergy Syringes (ALLERGY SYRINGE 1CC/27GX1/2") 27G X 1/2" 1 ML MISC Patient to use to inject MTX weekly 12 each 3  . valACYclovir (VALTREX) 500 MG tablet 1 tab bid for 3 days, start < 48 hours after onset. 18 tablet 2  . vitamin B-12 (CYANOCOBALAMIN) 100 MCG tablet Take 100 mcg by mouth daily.    Marland Kitchen VITAMIN D, ERGOCALCIFEROL, PO Take 1 capsule by mouth daily.    Patricia Reed XR 11 MG TB24 Take 1 tablet by mouth daily.     No facility-administered medications prior  to visit.    Review of Systems  Review of Systems  Constitutional: Negative.   HENT: Negative.   Respiratory: Negative for cough and wheezing.   Cardiovascular: Negative.    Physical Exam  BP 128/76 (BP Location: Left Arm, Patient Position: Sitting, Cuff Size: Normal)   Pulse 81   Temp 98.3 F (36.8 C)   Ht 5\' 5"  (1.651 m)   Wt 223 lb 12.8 oz (101.5 kg)   SpO2 98%   BMI 37.24 kg/m  Physical Exam Constitutional:      Appearance: Normal appearance.  HENT:     Right Ear: Tympanic membrane normal.     Left Ear: Tympanic membrane normal.     Nose: Nose normal.     Mouth/Throat:     Mouth: Mucous membranes are moist.     Pharynx: Oropharynx is clear.  Neck:     Musculoskeletal: Normal range of motion and neck supple.  Cardiovascular:     Rate and Rhythm: Regular rhythm.  Pulmonary:     Effort: Pulmonary effort is normal.     Breath sounds: Normal breath sounds.  Musculoskeletal: Normal range of motion.  Neurological:     General: No focal deficit present.     Mental Status: She is alert and oriented to person, place, and time. Mental status is at baseline.  Psychiatric:        Mood and Affect: Mood normal.        Behavior: Behavior normal.        Thought Content: Thought content normal.        Judgment: Judgment normal.      Lab Results:  CBC    Component Value Date/Time   WBC 5.1 05/26/2018 1034   RBC 4.06 05/26/2018 1034   HGB 11.8 05/26/2018 1034   HCT 35.7 05/26/2018 1034   PLT 321 05/26/2018 1034   MCV 87.9 05/26/2018 1034   MCH 29.1 05/26/2018 1034   MCHC 33.1 05/26/2018 1034   RDW 14.5 05/26/2018 1034   LYMPHSABS 734 (L) 05/26/2018 1034   MONOABS 0.3 09/18/2017 1040   EOSABS 556 (H) 05/26/2018 1034   BASOSABS 51 05/26/2018 1034    BMET    Component Value Date/Time   NA 141 05/26/2018 1034   K 4.1 05/26/2018 1034   CL 104 05/26/2018 1034   CO2 31 05/26/2018 1034   GLUCOSE 77 05/26/2018 1034   BUN 14 05/26/2018 1034   CREATININE 0.71  05/26/2018 1034   CALCIUM 9.0 05/26/2018 1034   GFRNONAA 95 05/26/2018 1034   GFRAA 110 05/26/2018 1034    BNP No results found for: BNP  ProBNP    Component Value Date/Time   PROBNP 28.2 11/25/2012 0543    Imaging: No results  found.   Assessment & Plan:   Mild intermittent asthma - Well controlled  - Rare SABA use, refills sent   Autoimmune disease ([place]) - Mixed connective tissue disease, inflammatory disease and MTX put her at risk for pulmonary disease  - Follows with Rheumatology - Continues 2.5mg  prednisone, Morrie Sheldon and Methotrexate IM weekly  - Needs repeat CXR today, if clinical status worsens needs Chest CT    Martyn Ehrich, NP 08/19/2018

## 2018-08-19 ENCOUNTER — Ambulatory Visit: Payer: BC Managed Care – PPO | Admitting: Primary Care

## 2018-08-19 ENCOUNTER — Encounter: Payer: Self-pay | Admitting: Primary Care

## 2018-08-19 ENCOUNTER — Ambulatory Visit (INDEPENDENT_AMBULATORY_CARE_PROVIDER_SITE_OTHER): Payer: BC Managed Care – PPO

## 2018-08-19 ENCOUNTER — Other Ambulatory Visit: Payer: Self-pay

## 2018-08-19 ENCOUNTER — Ambulatory Visit: Payer: BC Managed Care – PPO | Admitting: Emergency Medicine

## 2018-08-19 VITALS — BP 128/76 | HR 81 | Temp 98.3°F | Ht 65.0 in | Wt 223.8 lb

## 2018-08-19 DIAGNOSIS — J4520 Mild intermittent asthma, uncomplicated: Secondary | ICD-10-CM

## 2018-08-19 DIAGNOSIS — M359 Systemic involvement of connective tissue, unspecified: Secondary | ICD-10-CM | POA: Diagnosis not present

## 2018-08-19 MED ORDER — ALBUTEROL SULFATE (2.5 MG/3ML) 0.083% IN NEBU: 2.5000 mg | INHALATION_SOLUTION | Freq: Four times a day (QID) | RESPIRATORY_TRACT | 1 refills | Status: DC | PRN

## 2018-08-19 MED ORDER — ALBUTEROL SULFATE HFA 108 (90 BASE) MCG/ACT IN AERS: 2.0000 | INHALATION_SPRAY | Freq: Four times a day (QID) | RESPIRATORY_TRACT | 1 refills | Status: DC | PRN

## 2018-08-19 NOTE — Assessment & Plan Note (Signed)
-   Well controlled  - Rare SABA use, refills sent

## 2018-08-19 NOTE — Patient Instructions (Addendum)
Referral for nebulizer supplies  Use albuterol rescue inhaler/nebulizer every 6 hours as needed for shortness of breath/wheezing (refill sent)  CXR today  Follow up in 1 year with Dr. Lamonte Sakai OR sooner if respiratory symptoms worsen

## 2018-08-19 NOTE — Assessment & Plan Note (Addendum)
-   Mixed connective tissue disease, inflammatory disease and MTX put her at risk for pulmonary disease  - Follows with Rheumatology - Continues 2.5mg  prednisone, Morrie Sheldon and Methotrexate IM weekly  - Needs repeat CXR today, if clinical status worsens needs Chest CT

## 2018-08-19 NOTE — Progress Notes (Signed)
Please let patient know CXR looked normal

## 2018-08-28 ENCOUNTER — Other Ambulatory Visit: Payer: Self-pay | Admitting: Family Medicine

## 2018-08-28 DIAGNOSIS — A6000 Herpesviral infection of urogenital system, unspecified: Secondary | ICD-10-CM

## 2018-08-30 NOTE — Telephone Encounter (Signed)
Pt called checking status of refill. Pt is experiencing an outbreak. Please advise

## 2018-08-30 NOTE — Telephone Encounter (Signed)
See request °

## 2018-09-25 ENCOUNTER — Other Ambulatory Visit: Payer: Self-pay | Admitting: Family Medicine

## 2018-09-25 DIAGNOSIS — R1013 Epigastric pain: Secondary | ICD-10-CM

## 2018-09-25 DIAGNOSIS — K2940 Chronic atrophic gastritis without bleeding: Secondary | ICD-10-CM

## 2018-10-12 ENCOUNTER — Other Ambulatory Visit: Payer: Self-pay

## 2018-10-12 DIAGNOSIS — Z79899 Other long term (current) drug therapy: Secondary | ICD-10-CM

## 2018-10-12 LAB — CBC WITH DIFFERENTIAL/PLATELET
Absolute Monocytes: 203 cells/uL (ref 200–950)
Basophils Absolute: 18 cells/uL (ref 0–200)
Basophils Relative: 0.4 %
Eosinophils Absolute: 18 cells/uL (ref 15–500)
Eosinophils Relative: 0.4 %
HCT: 35.7 % (ref 35.0–45.0)
Hemoglobin: 11.6 g/dL — ABNORMAL LOW (ref 11.7–15.5)
Lymphs Abs: 1152 cells/uL (ref 850–3900)
MCH: 28.4 pg (ref 27.0–33.0)
MCHC: 32.5 g/dL (ref 32.0–36.0)
MCV: 87.5 fL (ref 80.0–100.0)
MPV: 11 fL (ref 7.5–12.5)
Monocytes Relative: 4.5 %
Neutro Abs: 3110 cells/uL (ref 1500–7800)
Neutrophils Relative %: 69.1 %
Platelets: 322 10*3/uL (ref 140–400)
RBC: 4.08 10*6/uL (ref 3.80–5.10)
RDW: 14.1 % (ref 11.0–15.0)
Total Lymphocyte: 25.6 %
WBC: 4.5 10*3/uL (ref 3.8–10.8)

## 2018-10-12 LAB — COMPLETE METABOLIC PANEL WITH GFR
AG Ratio: 1.4 (calc) (ref 1.0–2.5)
ALT: 12 U/L (ref 6–29)
AST: 15 U/L (ref 10–35)
Albumin: 4.1 g/dL (ref 3.6–5.1)
Alkaline phosphatase (APISO): 63 U/L (ref 37–153)
BUN: 13 mg/dL (ref 7–25)
CO2: 28 mmol/L (ref 20–32)
Calcium: 9.1 mg/dL (ref 8.6–10.4)
Chloride: 105 mmol/L (ref 98–110)
Creat: 0.73 mg/dL (ref 0.50–1.05)
GFR, Est African American: 107 mL/min/{1.73_m2} (ref >=60)
GFR, Est Non African American: 92 mL/min/{1.73_m2} (ref >=60)
Globulin: 3 g/dL (calc) (ref 1.9–3.7)
Glucose, Bld: 110 mg/dL — ABNORMAL HIGH (ref 65–99)
Potassium: 3.9 mmol/L (ref 3.5–5.3)
Sodium: 141 mmol/L (ref 135–146)
Total Bilirubin: 0.6 mg/dL (ref 0.2–1.2)
Total Protein: 7.1 g/dL (ref 6.1–8.1)

## 2018-10-13 NOTE — Progress Notes (Signed)
Hgb borderline low. Rest of CBC WNL. We will continue to monitor. Glucose is 110. Rest of CMP WNL.

## 2018-10-14 ENCOUNTER — Encounter: Payer: Self-pay | Admitting: Family Medicine

## 2018-11-10 ENCOUNTER — Telehealth: Payer: Self-pay | Admitting: Family Medicine

## 2018-11-10 ENCOUNTER — Ambulatory Visit: Payer: Self-pay

## 2018-11-10 ENCOUNTER — Other Ambulatory Visit: Payer: Self-pay

## 2018-11-10 ENCOUNTER — Encounter: Payer: Self-pay | Admitting: Rheumatology

## 2018-11-10 ENCOUNTER — Ambulatory Visit: Payer: BC Managed Care – PPO | Admitting: Rheumatology

## 2018-11-10 VITALS — BP 157/98 | HR 67 | Resp 13 | Ht 65.0 in | Wt 226.6 lb

## 2018-11-10 DIAGNOSIS — M797 Fibromyalgia: Secondary | ICD-10-CM

## 2018-11-10 DIAGNOSIS — Z8639 Personal history of other endocrine, nutritional and metabolic disease: Secondary | ICD-10-CM

## 2018-11-10 DIAGNOSIS — M25511 Pain in right shoulder: Secondary | ICD-10-CM

## 2018-11-10 DIAGNOSIS — Z8709 Personal history of other diseases of the respiratory system: Secondary | ICD-10-CM

## 2018-11-10 DIAGNOSIS — Z8679 Personal history of other diseases of the circulatory system: Secondary | ICD-10-CM

## 2018-11-10 DIAGNOSIS — M25512 Pain in left shoulder: Secondary | ICD-10-CM

## 2018-11-10 DIAGNOSIS — G8929 Other chronic pain: Secondary | ICD-10-CM

## 2018-11-10 DIAGNOSIS — M351 Other overlap syndromes: Secondary | ICD-10-CM | POA: Diagnosis not present

## 2018-11-10 DIAGNOSIS — Z8719 Personal history of other diseases of the digestive system: Secondary | ICD-10-CM

## 2018-11-10 DIAGNOSIS — Z8711 Personal history of peptic ulcer disease: Secondary | ICD-10-CM

## 2018-11-10 DIAGNOSIS — M7061 Trochanteric bursitis, right hip: Secondary | ICD-10-CM

## 2018-11-10 DIAGNOSIS — R5382 Chronic fatigue, unspecified: Secondary | ICD-10-CM | POA: Diagnosis not present

## 2018-11-10 DIAGNOSIS — Z79899 Other long term (current) drug therapy: Secondary | ICD-10-CM

## 2018-11-10 DIAGNOSIS — F5101 Primary insomnia: Secondary | ICD-10-CM

## 2018-11-10 DIAGNOSIS — M7062 Trochanteric bursitis, left hip: Secondary | ICD-10-CM

## 2018-11-10 NOTE — Telephone Encounter (Signed)
Patient went to appt at [facility] and Dr mentioned that patients blood pressure was high 152 over 103 and she can not get any injections bc of arm pain . Give patient a call back . Patient isnt in any pain .    Call [phone] Please advise

## 2018-11-10 NOTE — Progress Notes (Signed)
Office Visit Note  [name]             Date of Birth: [birth year]           MRN: [id number]             PCP: [place], [name], MD Referring: [place], [name], MD Visit Date: 11/10/2018 Occupation: @GUAROCC @  Subjective:  Left shoulder joint pain     History of Present Illness: Patricia Reed is a 58 y.o. female with history of mixed connective tissue disease.  She is taking Xeljanz 11 mg XR daily, methotrexate 0.6 mL once weekly, and folic acid 2 mg by mouth daily.  She is on long-term prednisone 2.5 mg by mouth daily.  She presents today with left shoulder joint pain.  She states that her discomfort started about 1 month ago and has progressively been getting worse.  She is also been experiencing nocturnal pain and has noticed limited range of motion.  She states she starts her started having discomfort in the left shoulder joint over the past several weeks.  She has been taking Tylenol as needed for pain relief.  She has been experiencing trapezius muscle tension and muscle tenderness bilaterally.  She said she is occasional stiffness in her hands and uses Voltaren gel topically as needed.  She denies any other joint pain or joint swelling at this time.  She states that she has not had very much arthritis flare since February 2020.  She [facility] on [facility] regular basis.  Her last appointment was on 08/03/2018. She denies any recent rashes.  She continues to have photosensitivity and wears sunscreen if she plans on being in the sun.  She has ongoing symptoms of Raynaud's.  She continues to have recurrent oral ulcerations but no nasal ulcerations.  She has chronic mouth dryness but no eye dryness.  She has not had any shortness of breath, chest pain, or palpitations.  She denies any worsening fatigue, swollen lymph nodes, or fevers.   Activities of Daily Living:  Patient reports morning stiffness for 0   minutes.   Patient Reports  nocturnal pain.  Difficulty dressing/grooming: Reports Difficulty climbing stairs: Denies Difficulty getting out of chair: Denies Difficulty using hands for taps, buttons, cutlery, and/or writing: Denies  Review of Systems  Constitutional: Negative for fatigue.  HENT: Positive for mouth sores and mouth dryness. Negative for nose dryness.   Eyes: Negative for pain, visual disturbance and dryness.  Respiratory: Negative for cough, hemoptysis, shortness of breath and difficulty breathing.   Cardiovascular: Negative for chest pain, palpitations, hypertension and swelling in legs/feet.  Gastrointestinal: Negative for blood in stool, constipation and diarrhea.  Endocrine: Negative for increased urination.  Genitourinary: Negative for painful urination.  Musculoskeletal: Positive for arthralgias and joint pain. Negative for joint swelling, myalgias, muscle weakness, morning stiffness, muscle tenderness and myalgias.  Skin: Positive for color change. Negative for pallor, rash, hair loss, nodules/bumps, skin tightness, ulcers and sensitivity to sunlight.  Allergic/Immunologic: Negative for susceptible to infections.  Neurological: Negative for dizziness, numbness, headaches and weakness.  Hematological: Negative for swollen glands.  Psychiatric/Behavioral: Positive for sleep disturbance. Negative for depressed mood. The patient is not nervous/anxious.     PMFS History:  Patient Active Problem List   Diagnosis Date Noted   Mixed connective tissue disease (HCC) 08/14/2017   Recurrent genital herpes 05/12/2017   Shortness of breath 04/15/2017   Dizziness 02/04/2017   Situational anxiety 06/08/2016  Atypical chest pain 06/08/2016   Trapezius muscle spasm 04/23/2016   Fibromyalgia 02/13/2016   Primary insomnia 02/13/2016   Chronic fatigue 02/13/2016   Vitamin D deficiency 11/25/2015   Autoimmune disease (HCC) 11/23/2015   High risk medication use 11/23/2015   Colon cancer  screening 01/25/2014   Anterior neck pain 12/01/2013   Inflammatory arthritis 08/18/2012   Allergic rhinitis 06/23/2012   HTN (hypertension) 06/23/2012   High cholesterol 06/23/2012   Thyroid nodule - benign 06/23/2012   Arthralgia 06/23/2012   Mild intermittent asthma    Sickle cell trait (HCC)    History of stomach ulcers    ESOPHAGEAL STRICTURE 10/04/2007   GERD 10/04/2007   Atrophic 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   Dysphagia, pharyngoesophageal phase 10/04/2007    Past Medical History:  Diagnosis Date   Allergy    Anemia    Asthma    Blood transfusion without reported diagnosis    Fibromyalgia    GERD (gastroesophageal reflux disease)    Heart murmur    High cholesterol    History of stomach ulcers    Hypertension    Lupus (HCC)    MI, old    Rheumatoid arthritis (HCC)    Thyroid disease    Thyroid mass    Vertigo     Family History  Problem Relation Age of Onset   Alcohol abuse Father    Hypertension Father    Heart disease Father    Colon cancer Maternal Uncle    Diabetes Maternal Grandmother    Colon polyps Neg Hx    Esophageal cancer Neg Hx    Kidney disease Neg Hx    Stomach cancer Neg Hx    Rectal cancer Neg Hx    Past Surgical History:  Procedure Laterality Date   ABDOMINAL HYSTERECTOMY     done for menorrhagia, ovaries remain   CERVICAL DISCECTOMY     plates and screws in neck   CESAREAN SECTION     COLONOSCOPY     ESOPHAGEAL MANOMETRY N/A 02/12/2014   Procedure: ESOPHAGEAL MANOMETRY (EM);  Surgeon: Louis Meckelobert D Kaplan, MD;  Location: [facility];  Service: Endoscopy;  Laterality: N/A;   ESOPHAGOGASTRODUODENOSCOPY  03/04/2011   Procedure: ESOPHAGOGASTRODUODENOSCOPY (EGD);  Surgeon: Louis Meckelobert D Kaplan, MD;  [facility];  Service: Endoscopy;  Laterality: N/A;  BOTOX Injection   exc benign breast lump     TONSILLECTOMY     UPPER GASTROINTESTINAL ENDOSCOPY     Social History   Social History Narrative     First grade Geologist, engineeringteacher assistant.  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     Immunization History  Administered Date(s) Administered   Influenza, Quadrivalent, Recombinant, Inj, Pf 10/11/2017   Influenza,inj,Quad PF,6+ Mos 11/04/2015   Zoster Recombinat (Shingrix) 10/11/2017, 12/25/2017     Objective: Vital Signs: BP (!) 157/98 (BP Location: Right Arm, Patient Position: Sitting, Cuff Size: Large)    Pulse 67    Resp 13    Ht 5\' 5"  (1.651 m)    Wt 226 lb 9.6 oz (102.8 kg)    BMI 37.71 kg/m    Physical Exam Vitals signs and nursing note reviewed.  Constitutional:      Appearance: She is well-developed.  HENT:     Head: Normocephalic and atraumatic.  Eyes:     Conjunctiva/sclera: Conjunctivae normal.  Neck:     Musculoskeletal: Normal range of motion.  Cardiovascular:     Rate and Rhythm: Normal rate and regular rhythm.     Heart sounds: Normal  heart sounds.  Pulmonary:     Effort: Pulmonary effort is normal.     Breath sounds: Normal breath sounds.  Abdominal:     General: Bowel sounds are normal.     Palpations: Abdomen is soft.  Lymphadenopathy:     Cervical: No cervical adenopathy.  Skin:    General: Skin is warm and dry.     Capillary Refill: Capillary refill takes less than 2 seconds.  Neurological:     Mental Status: She is alert and oriented to person, place, and time.  Psychiatric:        Behavior: Behavior normal.      Musculoskeletal Exam: C-spine limited ROM with lateral rotation.  Trapezius muscle tension and tenderness.  Right shoulder ROM to about 120 degrees of forward flexion. Left shoulder forward flexion to about 90 degrees. Elbow joints, wrist joints, MCPs, PIPs, and DIPs good ROM with no synovitis.  Complete fist formation.  Knee joints, ankle joints, MTPs, PIPs, and DIPs good ROM with no synovitis.  No warmth or effusion of knee joints.  No tenderness or swelling of ankle joints.   CDAI Exam: CDAI Score: -- Patient Global: --; Provider  Global: -- Swollen: --; Tender: -- Joint Exam   No joint exam has been documented for this visit   There is currently no information documented on the homunculus. Go to the Rheumatology activity and complete the homunculus joint exam.  Investigation: No additional findings.  Imaging: No results found.  Recent Labs: Lab Results  Component Value Date   WBC 4.5 10/12/2018   HGB 11.6 (L) 10/12/2018   PLT 322 10/12/2018   NA 141 10/12/2018   K 3.9 10/12/2018   CL 105 10/12/2018   CO2 28 10/12/2018   GLUCOSE 110 (H) 10/12/2018   BUN 13 10/12/2018   CREATININE 0.73 10/12/2018   BILITOT 0.6 10/12/2018   ALKPHOS 58 09/18/2017   AST 15 10/12/2018   ALT 12 10/12/2018   PROT 7.1 10/12/2018   ALBUMIN 4.0 09/18/2017   CALCIUM 9.1 10/12/2018   GFRAA 107 10/12/2018   QFTBGOLDPLUS NEGATIVE 07/26/2018    Speciality Comments: No specialty comments available.  Procedures:  No procedures performed Allergies: Aspirin, Hydrocodone-acetaminophen, Ibuprofen, Imdur [isosorbide nitrate], Meperidine hcl, Morphine, Oxycodone-acetaminophen, Procaine hcl, Toprol xl [metoprolol], and Tramadol   Assessment / Plan:     Visit Diagnoses: Mixed connective tissue disease (HCC) +ANA,+Sm,+RNP,+RF, arthritis: She is not having any signs or symptoms of a flare.  She has no synovitis on exam.  She presents today with pain in bilateral shoulder joints, which started 1 month ago and has progressively been worsening.  She has very limited range of motion of both shoulder joints on exam.  X-rays of both shoulders were obtained today.  She has no other joint pain or joint swelling at this time. Overall she is clinically been doing well on Xeljanz 11 mg XR, methotrexate 0.6 mg every week, and folic acid 2 mg by mouth daily.  She is on long-term prednisone 2.5 mg by mouth daily.  She will continue on his current treatment regimen. She has not had any recent rashes.  She has ongoing photosensitivity and wears sunscreen.   She has recurrent oral ulcerations but no nasal ulcerations.  She has chronic mouth dryness but no eye dryness. She has intermittent symptoms of Raynaud's but no digital ulcerations or signs of gangrene were noted. She has not had any worsening fatigue, fevers, or enlarged lymph nodes. No SOB, chest pain, or palpitations.  She  continues to follow up with Dr. Rozelle Logan.  She was advised to notify us if she develops increased joint pain or joint swelling.  She will follow-up in the office in 5 months.   High risk medication use - Xeljanz XR 11 mg 1 tablet daily, methotrexate 0.6 ml every 7 days, and folic acid 1 mg 2 tablets daily.  Last TB gold negative on 07/26/2018 and will monitor yearly.  Last lipid panel showed elevated LDL on 05/26/2018 and will monitor yearly.  Most recent CBC/CMP within normal limits except for low hemoglobin but stable on 10/12/2018 and will monitor every 3 months.  Fibromyalgia: She continues have generalized muscle aches and muscle tenderness due to fibromyalgia.  She presents today with trapezius muscle tension and muscle tenderness bilaterally.  Chronic fatigue: She has not had any worsening fatigue recently.   Primary insomnia: She does not sleep well at night.  She typically only sleeps about 2 to 3 hours.  She has tried taking melatonin and ZzzQuil at night.  Trochanteric bursitis of both hips: Resolved   Chronic pain of both shoulders - She presents today with pain in bilateral shoulder joints.  She has been experiencing left shoulder arm pain for the past 1 month.  Her discomfort has progressively been getting worse and she has been experiencing nocturnal pain.  She has very limited range of motion on exam with discomfort.  Right shoulder started to cause discomfort in the past 1 to 2 weeks.  X-rays of both shoulders were obtained today.  She was given a handout of shoulder joint exercises to perform.  Plan: XR Shoulder Left, XR Shoulder Right  History of vitamin D deficiency:  She is taking a vitamin D supplement.   History of gastroesophageal reflux (GERD)  History of stomach ulcers  History of high cholesterol  History of hypertension  History of asthma    Orders: Orders Placed This Encounter  Procedures   XR Shoulder Left   XR Shoulder Right   No orders of the defined types were placed in this encounter.   Face-to-face time spent with patient was 30 minutes. Greater than 50% of time was spent in counseling and coordination of care.  Follow-Up Instructions: Return in about 5 months (around 04/10/2019) for Mixed connective tissue disease .   Ofilia Neas, PA-C   I examined and evaluated the patient with Hazel Sams PA.  Patient is overall doing well.  She had no synovitis on examination.  Although she has bilateral frozen shoulders.  The x-rays of bilateral shoulders were unremarkable.  I could not inject her shoulders today as her blood pressure was elevated.  She declined physical therapy.  Have given her a handout on shoulder joint exercises.  I also advised her to contact Dr. [name] as her blood pressure is elevated.  Once her blood pressure comes down back to normal then she can come in for shoulder joint injection.  The plan of care was discussed as noted above.  Bo Merino, MD  Note - This record has been created using Editor, commissioning.  Chart creation errors have been sought, but may not always  have been located. Such creation errors do not reflect on  the standard of medical care.

## 2018-11-10 NOTE — Patient Instructions (Signed)
Shoulder Exercises Ask your health care provider which exercises are safe for you. Do exercises exactly as told by your health care provider and adjust them as directed. It is normal to feel mild stretching, pulling, tightness, or discomfort as you do these exercises. Stop right away if you feel sudden pain or your pain gets worse. Do not begin these exercises until told by your health care provider. Stretching exercises External rotation and abduction This exercise is sometimes called corner stretch. This exercise rotates your arm outward (external rotation) and moves your arm out from your body (abduction). 1. Stand in a doorway with one of your feet slightly in front of the other. This is called a staggered stance. If you cannot reach your forearms to the door frame, stand facing a corner of a room. 2. Choose one of the following positions as told by your health care provider: ? Place your hands and forearms on the door frame above your head. ? Place your hands and forearms on the door frame at the height of your head. ? Place your hands on the door frame at the height of your elbows. 3. Slowly move your weight onto your front foot until you feel a stretch across your chest and in the front of your shoulders. Keep your head and chest upright and keep your abdominal muscles tight. 4. Hold for __________ seconds. 5. To release the stretch, shift your weight to your back foot. Repeat __________ times. Complete this exercise __________ times a day. Extension, standing 1. Stand and hold a broomstick, a cane, or a similar object behind your back. ? Your hands should be a little wider than shoulder width apart. ? Your palms should face away from your back. 2. Keeping your elbows straight and your shoulder muscles relaxed, move the stick away from your body until you feel a stretch in your shoulders (extension). ? Avoid shrugging your shoulders while you move the stick. Keep your shoulder blades tucked  down toward the middle of your back. 3. Hold for __________ seconds. 4. Slowly return to the starting position. Repeat __________ times. Complete this exercise __________ times a day. Range-of-motion exercises Pendulum  1. Stand near a wall or a surface that you can hold onto for balance. 2. Bend at the waist and let your left / right arm hang straight down. Use your other arm to support you. Keep your back straight and do not lock your knees. 3. Relax your left / right arm and shoulder muscles, and move your hips and your trunk so your left / right arm swings freely. Your arm should swing because of the motion of your body, not because you are using your arm or shoulder muscles. 4. Keep moving your hips and trunk so your arm swings in the following directions, as told by your health care provider: ? Side to side. ? Forward and backward. ? In clockwise and counterclockwise circles. 5. Continue each motion for __________ seconds, or for as long as told by your health care provider. 6. Slowly return to the starting position. Repeat __________ times. Complete this exercise __________ times a day. Shoulder flexion, standing  1. Stand and hold a broomstick, a cane, or a similar object. Place your hands a little more than shoulder width apart on the object. Your left / right hand should be palm up, and your other hand should be palm down. 2. Keep your elbow straight and your shoulder muscles relaxed. Push the stick up with your healthy arm to   raise your left / right arm in front of your body, and then over your head until you feel a stretch in your shoulder (flexion). ? Avoid shrugging your shoulder while you raise your arm. Keep your shoulder blade tucked down toward the middle of your back. 3. Hold for __________ seconds. 4. Slowly return to the starting position. Repeat __________ times. Complete this exercise __________ times a day. Shoulder abduction, standing 1. Stand and hold a broomstick,  a cane, or a similar object. Place your hands a little more than shoulder width apart on the object. Your left / right hand should be palm up, and your other hand should be palm down. 2. Keep your elbow straight and your shoulder muscles relaxed. Push the object across your body toward your left / right side. Raise your left / right arm to the side of your body (abduction) until you feel a stretch in your shoulder. ? Do not raise your arm above shoulder height unless your health care provider tells you to do that. ? If directed, raise your arm over your head. ? Avoid shrugging your shoulder while you raise your arm. Keep your shoulder blade tucked down toward the middle of your back. 3. Hold for __________ seconds. 4. Slowly return to the starting position. Repeat __________ times. Complete this exercise __________ times a day. Internal rotation  1. Place your left / right hand behind your back, palm up. 2. Use your other hand to dangle an exercise band, a towel, or a similar object over your shoulder. Grasp the band with your left / right hand so you are holding on to both ends. 3. Gently pull up on the band until you feel a stretch in the front of your left / right shoulder. The movement of your arm toward the center of your body is called internal rotation. ? Avoid shrugging your shoulder while you raise your arm. Keep your shoulder blade tucked down toward the middle of your back. 4. Hold for __________ seconds. 5. Release the stretch by letting go of the band and lowering your hands. Repeat __________ times. Complete this exercise __________ times a day. Strengthening exercises External rotation  1. Sit in a stable chair without armrests. 2. Secure an exercise band to a stable object at elbow height on your left / right side. 3. Place a soft object, such as a folded towel or a small pillow, between your left / right upper arm and your body to move your elbow about 4 inches (10 cm) away  from your side. 4. Hold the end of the exercise band so it is tight and there is no slack. 5. Keeping your elbow pressed against the soft object, slowly move your forearm out, away from your abdomen (external rotation). Keep your body steady so only your forearm moves. 6. Hold for __________ seconds. 7. Slowly return to the starting position. Repeat __________ times. Complete this exercise __________ times a day. Shoulder abduction  1. Sit in a stable chair without armrests, or stand up. 2. Hold a __________ weight in your left / right hand, or hold an exercise band with both hands. 3. Start with your arms straight down and your left / right palm facing in, toward your body. 4. Slowly lift your left / right hand out to your side (abduction). Do not lift your hand above shoulder height unless your health care provider tells you that this is safe. ? Keep your arms straight. ? Avoid shrugging your shoulder while you   do this movement. Keep your shoulder blade tucked down toward the middle of your back. 5. Hold for __________ seconds. 6. Slowly lower your arm, and return to the starting position. Repeat __________ times. Complete this exercise __________ times a day. Shoulder extension 1. Sit in a stable chair without armrests, or stand up. 2. Secure an exercise band to a stable object in front of you so it is at shoulder height. 3. Hold one end of the exercise band in each hand. Your palms should face each other. 4. Straighten your elbows and lift your hands up to shoulder height. 5. Step back, away from the secured end of the exercise band, until the band is tight and there is no slack. 6. Squeeze your shoulder blades together as you pull your hands down to the sides of your thighs (extension). Stop when your hands are straight down by your sides. Do not let your hands go behind your body. 7. Hold for __________ seconds. 8. Slowly return to the starting position. Repeat __________ times.  Complete this exercise __________ times a day. Shoulder row 1. Sit in a stable chair without armrests, or stand up. 2. Secure an exercise band to a stable object in front of you so it is at waist height. 3. Hold one end of the exercise band in each hand. Position your palms so that your thumbs are facing the ceiling (neutral position). 4. Bend each of your elbows to a 90-degree angle (right angle) and keep your upper arms at your sides. 5. Step back until the band is tight and there is no slack. 6. Slowly pull your elbows back behind you. 7. Hold for __________ seconds. 8. Slowly return to the starting position. Repeat __________ times. Complete this exercise __________ times a day. Shoulder press-ups  1. Sit in a stable chair that has armrests. Sit upright, with your feet flat on the floor. 2. Put your hands on the armrests so your elbows are bent and your fingers are pointing forward. Your hands should be about even with the sides of your body. 3. Push down on the armrests and use your arms to lift yourself off the chair. Straighten your elbows and lift yourself up as much as you comfortably can. ? Move your shoulder blades down, and avoid letting your shoulders move up toward your ears. ? Keep your feet on the ground. As you get stronger, your feet should support less of your body weight as you lift yourself up. 4. Hold for __________ seconds. 5. Slowly lower yourself back into the chair. Repeat __________ times. Complete this exercise __________ times a day. Wall push-ups  1. Stand so you are facing a stable wall. Your feet should be about one arm-length away from the wall. 2. Lean forward and place your palms on the wall at shoulder height. 3. Keep your feet flat on the floor as you bend your elbows and lean forward toward the wall. 4. Hold for __________ seconds. 5. Straighten your elbows to push yourself back to the starting position. Repeat __________ times. Complete this exercise  __________ times a day. This information is not intended to replace advice given to you by your health care provider. Make sure you discuss any questions you have with your health care provider. Document Released: 11/19/2004 Document Revised: 04/29/2018 Document Reviewed: 02/04/2018 Elsevier Patient Education  2020 [employer]  

## 2018-11-11 ENCOUNTER — Other Ambulatory Visit: Payer: Self-pay

## 2018-11-11 ENCOUNTER — Encounter: Payer: Self-pay | Admitting: Family Medicine

## 2018-11-11 ENCOUNTER — Ambulatory Visit: Payer: BC Managed Care – PPO | Admitting: Family Medicine

## 2018-11-11 VITALS — BP 132/86 | HR 84 | Temp 98.5°F | Resp 16 | Ht 65.0 in | Wt 224.0 lb

## 2018-11-11 DIAGNOSIS — H6122 Impacted cerumen, left ear: Secondary | ICD-10-CM

## 2018-11-11 DIAGNOSIS — I10 Essential (primary) hypertension: Secondary | ICD-10-CM | POA: Diagnosis not present

## 2018-11-11 DIAGNOSIS — R519 Headache, unspecified: Secondary | ICD-10-CM | POA: Diagnosis not present

## 2018-11-11 DIAGNOSIS — G4700 Insomnia, unspecified: Secondary | ICD-10-CM | POA: Diagnosis not present

## 2018-11-11 MED ORDER — AMLODIPINE BESYLATE 2.5 MG PO TABS: 2.5000 mg | ORAL_TABLET | Freq: Every day | ORAL | 0 refills | Status: DC

## 2018-11-11 MED ORDER — BACLOFEN 10 MG PO TABS: 10.0000 mg | ORAL_TABLET | Freq: Every evening | ORAL | 0 refills | Status: DC | PRN

## 2018-11-11 NOTE — Progress Notes (Signed)
Chief Complaint  Patient presents with  . Hypertension  . Headache   HPI [name] is a 58 y.o.female with hx of chronic fatigue,connective tissue disease,and fibromyalgia who is here today concerned about elevated BP.  Last follow up visit: 03/15/18. Hx of HTN,she has been on non pharmacologic treatment. She was at her rheumatologist's office and could not have treatment because elevated BP.  BP was 157/98 yesterday. 08/03/18 BP was 143.85.  She has not noted visual changes, exertional chest pain, dyspnea,  focal weakness, or edema. Home BP readings: She is not checking BP.   Lab Results  Component Value Date   CREATININE 0.73 10/12/2018   BUN 13 10/12/2018   NA 141 10/12/2018   K 3.9 10/12/2018   CL 105 10/12/2018   CO2 28 10/12/2018   Occipital pressure headache, this has been going on for long time. She wakes up with headache. She denies associated visual changes, nausea, vomiting, or focal neurologic deficit. She takes Tylenol. It seems to be more frequent for the past 1 to 2 weeks. Yesterday she noticed photophobia.  2 months of intermittent hearing loss left ear. She denies any ear trauma, tinnitus, recent URI, or trauma. She has not identified exacerbating or alleviating factors. She has not tried OTC medications.  Insomnia: This problem has been going on for a while. She has tried some sleep aids OTC. She has not identified exacerbating or alleviating factors.  Review of Systems  Constitutional: Positive for fatigue. Negative for activity change, appetite change and fever.  HENT: Negative for mouth sores, nosebleeds and sore throat.   Eyes: Negative for pain and redness.  Respiratory: Negative for cough, shortness of breath and wheezing.   Cardiovascular: Negative for chest pain, palpitations and leg swelling.  Gastrointestinal: Negative for abdominal pain.       Negative for changes in bowel habits.  Genitourinary: Negative for decreased  urine volume and hematuria.  Musculoskeletal: Positive for arthralgias and myalgias.  Neurological: Negative for syncope, facial asymmetry and weakness.  Psychiatric/Behavioral: Negative for confusion. The patient is nervous/anxious.   Rest see pertinent positives and negatives per HPI.  Current Outpatient Medications on File Prior to Visit  Medication Sig Dispense Refill  . acetaminophen (TYLENOL) 650 MG CR tablet Take 650 mg by mouth daily.     Marland Kitchen albuterol (PROVENTIL) (2.5 MG/3ML) 0.083% nebulizer solution Take 3 mLs (2.5 mg total) by nebulization every 6 (six) hours as needed for wheezing or shortness of breath. 75 mL 1  . albuterol (VENTOLIN HFA) 108 (90 Base) MCG/ACT inhaler Inhale 2 puffs into the lungs every 6 (six) hours as needed for wheezing or shortness of breath. 6.7 g 1  . aspirin 81 MG tablet Take 81 mg by mouth daily.    . B-D INS SYR ULTRAFINE 1CC/30G 30G X 1/2" 1 ML MISC     . diclofenac sodium (VOLTAREN) 1 % GEL Apply 3 g to 3 large joints up to 3 times daily. 3 Tube 3  . fluticasone (FLONASE) 50 MCG/ACT nasal spray Place 2 sprays into both nostrils daily. (Patient taking differently: Place 2 sprays into both nostrils as needed. ) 16 g 6  . folic acid (FOLVITE) 1 MG tablet TAKE 2 TABLETS (2 MG TOTAL) BY MOUTH DAILY. 180 tablet 3  . methotrexate 50 MG/2ML injection Inject 0.6 mLs (15 mg total) into the skin once a week. 8 mL 0  . pantoprazole (PROTONIX) 40 MG tablet TAKE 1 TABLET BY MOUTH EVERY DAY  90 tablet 1  . predniSONE (DELTASONE) 5 MG tablet Take 2.5 tablets by mouth daily.    . Tuberculin-Allergy Syringes (ALLERGY SYRINGE 1CC/27GX1/2") 27G X 1/2" 1 ML MISC Patient to use to inject MTX weekly 12 each 3  . valACYclovir (VALTREX) 500 MG tablet TAKE 1 TABLET (500 MG TOTAL) BY MOUTH 2 (TWO) TIMES DAILY. FOR 3 DAYS WITH ACUTE EPISODES. 18 tablet 0  . vitamin B-12 (CYANOCOBALAMIN) 100 MCG tablet Take 100 mcg by mouth daily.    Marland Kitchen VITAMIN D, ERGOCALCIFEROL, PO Take 1 capsule by  mouth daily.    [name] XR 11 MG TB24 Take 1 tablet by mouth daily.     No current facility-administered medications on file prior to visit.      Past Medical History:  Diagnosis Date  . Allergy   . Anemia   . Asthma   . Blood transfusion without reported diagnosis   . Fibromyalgia   . GERD (gastroesophageal reflux disease)   . Heart murmur   . High cholesterol   . History of stomach ulcers   . Hypertension   . Lupus (HCC)   . MI, old   . Rheumatoid arthritis (HCC)   . Thyroid disease   . Thyroid mass   . Vertigo     Allergies  Allergen Reactions  . Aspirin Other (See Comments)    GI bleed  . Hydrocodone-Acetaminophen Hives and Nausea And Vomiting  . Ibuprofen Other (See Comments)    GI bleed  . Imdur [Isosorbide Nitrate] Other (See Comments)    Reaction unknown  . Meperidine Hcl Hives and Nausea And Vomiting    Short term memory loss  . Morphine Hives and Nausea And Vomiting  . Oxycodone-Acetaminophen Hives and Nausea And Vomiting    Reaction unknown  . Procaine Hcl Other (See Comments)    Ineffective  . Toprol Xl [Metoprolol] Other (See Comments)    Reaction unknown  . Tramadol Itching    Social History   Socioeconomic History  . Marital status: Married    Spouse name: Not on file  . Number of children: 4  . Years of education: Not on file  . Highest education level: Not on file  Occupational History  . Occupation: Lobbyist: [employer]  Social Needs  . Financial resource strain: Not on file  . Food insecurity    Worry: Not on file    Inability: Not on file  . Transportation needs    Medical: Not on file    Non-medical: Not on file  Tobacco Use  . Smoking status: Never Smoker  . Smokeless tobacco: Never Used  Substance and Sexual Activity  . Alcohol use: Yes    Alcohol/week: 1.0 standard drinks    Types: 1 Glasses of wine per week    Comment: occ  . Drug use: No  . Sexual activity: Yes    Birth  control/protection: None, Surgical    Comment: LAVH  Lifestyle  . Physical activity    Days per week: Not on file    Minutes per session: Not on file  . Stress: Not on file  Relationships  . Social Musician on phone: Not on file    Gets together: Not on file    Attends religious service: Not on file    Active member of club or organization: Not on file    Attends meetings of clubs or organizations: Not on file    Relationship  status: Not on file  Other Topics Concern  . Not on file  Social History Narrative   First grade Geologist, engineeringteacher assistant.  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      Vitals:   11/11/18 1144  BP: 132/86  Pulse: 84  Resp: 16  Temp: 98.5 F (36.9 C)  SpO2: 98%   Body mass index is 37.28 kg/m.   Physical Exam  Nursing note and vitals reviewed. Constitutional: She is oriented to person, place, and time. She appears well-developed. No distress.  HENT:  Head: Normocephalic and atraumatic.  Right Ear: Tympanic membrane, external ear and ear canal normal.  Left Ear: External ear normal.  Mouth/Throat: Oropharynx is clear and moist and mucous membranes are normal.  Left cerumen excess, TM cannot be visualized.  Eyes: Pupils are equal, round, and reactive to light. Conjunctivae are normal.  Cardiovascular: Normal rate and regular rhythm.  No murmur heard. Pulses:      Dorsalis pedis pulses are 2+ on the right side and 2+ on the left side.  Respiratory: Effort normal and breath sounds normal. No respiratory distress.  GI: Soft. She exhibits no mass. There is no hepatomegaly. There is no abdominal tenderness.  Musculoskeletal:        General: No edema.  Lymphadenopathy:    She has no cervical adenopathy.  Neurological: She is alert and oriented to person, place, and time. She has normal strength. No cranial nerve deficit. Gait normal.  Reflex Scores:      Patellar reflexes are 2+ on the right side and 2+ on the left side. Skin: Skin is  warm. No rash noted. No erythema.  Psychiatric: Her mood appears anxious.  Well groomed, good eye contact.    ASSESSMENT AND PLAN:   Ms. Bari Edwardngelette was seen today for hypertension and headache.  Diagnoses and all orders for this visit:  Benign essential hypertension BP re-checked 152/90. Possible complications of elevated BP discussed. Low salt and DASH diet recommended. Amlodipine to take at bedtime. Instructed about warning signs.  -     amLODipine (NORVASC) 2.5 MG tablet; Take 1 tablet (2.5 mg total) by mouth daily.  Occipital headache ? Tension headache. Recommend trying Baclofen,side effects discussed. A better sleep may also help.  -     baclofen (LIORESAL) 10 MG tablet; Take 1 tablet (10 mg total) by mouth at bedtime as needed for muscle spasms.  Insomnia, unspecified type Good sleep hygiene. Melatonin 10 mg after supper.  Impacted cerumen of left ear Hearing improved after ear lavage. Avoid q tips. F/U as needed.   Return in about 2 months (around 01/11/2019) for htn.  -Ms. Britni J Reeves advised to return sooner than planned today if new concerns arise.     [name]. Swazil[place]Jordan, MD  [facility]. [place] office.

## 2018-11-11 NOTE — Patient Instructions (Addendum)
A few things to remember from today's visit:   Benign essential hypertension - Plan: amLODipine (NORVASC) 2.5 MG tablet  Occipital headache - Plan: baclofen (LIORESAL) 10 MG tablet  Insomnia, unspecified type  Impacted cerumen of left ear  Blood pressure goal for most people is less than 140/90. Some populations (older than 60) the goal is less than 150/90.  Most recent cardiologists' recommendations recommend blood pressure at or less than 130/80.   Elevated blood pressure increases the risk of strokes, heart and kidney disease, and eye problems. Regular physical activity and a healthy diet (DASH diet) usually help. Low salt diet. Take medications as instructed.  Caution with some over the counter medications as cold medications, dietary products (for weight loss), and Ibuprofen or Aleve (frequent use);all these medications could cause elevation of blood pressure.  Melatonin 10 mg 2 hours after supper.   Please be sure medication list is accurate. If a new problem present, please set up appointment sooner than planned today.

## 2018-11-13 ENCOUNTER — Encounter: Payer: Self-pay | Admitting: Family Medicine

## 2018-11-14 NOTE — Telephone Encounter (Signed)
Can you please check on the status of pt

## 2018-11-15 NOTE — Telephone Encounter (Signed)
Pt was seen at the office on 11/11/2018 by Dr [name] regarding this problem

## 2018-11-16 ENCOUNTER — Ambulatory Visit: Payer: Self-pay | Admitting: *Deleted

## 2018-11-16 NOTE — Telephone Encounter (Signed)
Spoke with patient and she is feeling some better. Patient will recheck her BP and call back around 3:30pm I requested the patient schedule a virtual appointment regarding her BP.  CRM

## 2018-11-16 NOTE — Telephone Encounter (Signed)
We just started Amlodipine 2.5 mg, she could increase dose to 5 mg (2 tabs) daily at bedtime. She needs to give time for medication to work. Continue monitoring BP regularly. Keep follow up appt.  Thanks, BJ

## 2018-11-16 NOTE — Telephone Encounter (Signed)
  Pt called in concerned about her BP being elevated.     I have RA and Lupus.   I went to get my injection and they would not give it to me because my BP was elevated.   I've not had trouble with high BP before.    I saw Dr. [name] last week and she started me on Amlodipine 2.5 mg.   My first dose was Saturday. My BP is still high.    I've been keeping a log at Dr. Doug Sou request.    See BP readings below.   "I'm concerned my BP is still high".   I feel lightheaded but I've been feeling this way for 2-3 months.    Dr. [name] knows about it.  I've been having headaches especially bad in the mornings.  I'm in pain with the RA and Lupus and need my medications to keep my joints from locking up so I really need to get my BP down.   "I had a heart attack in 2014 so I'm really concerned about this BP.  I went over the care advice with her and she was agreeable to the instructions.  She was agreeable to me sending a high priority note to Dr. [name] letting her know what is going on.    Someone from the office will be in touch with you.  I sent these notes high priority to Dr. Doug Sou office.   I called and let them know.  Reason for Disposition . [1] Taking BP medications AND [2] feels is having side effects (e.g., impotence, cough, dizzy upon standing)  Answer Assessment - Initial Assessment Questions 1. BLOOD PRESSURE: "What is the blood pressure?" "Did you take at least two measurements 5 minutes apart?"     158/110 P-124 at 12:20  While sitting.   I'm light headed.   I've been light headed for days.    2nd BP is 152/90 just now 12:28 PM. 2. ONSET: "When did you take your blood pressure?"     Just now 12:20.       I saw Dr. [name] and she told me to monitor my BP.    3. HOW: "How did you obtain the blood pressure?" (e.g., visiting nurse, automatic home BP monitor)     I'm keeping a log of my BP.  I have RA and Lups and I can't do my injections until this BP comes down.    I have an automatic BP  cuff.    I'm at work now. 4. HISTORY: "Do you have a history of high blood pressure?"     No  I don't know what is going on. 5. MEDICATIONS: "Are you taking any medications for blood pressure?" "Have you missed any doses recently?"     I just started 2.5 mg Amlodipine on Saturday. 6. OTHER SYMPTOMS: "Do you have any symptoms?" (e.g., headache, chest pain, blurred vision, difficulty breathing, weakness)     Light headed, headaches especially bad in the mornings.   At 140/93 6:00 AM.   I took Tylenol for the headache.   7. PREGNANCY: "Is there any chance you are pregnant?" "When was your last menstrual period?"     Not asked due to age  Protocols used: [place]

## 2018-11-17 ENCOUNTER — Other Ambulatory Visit: Payer: Self-pay

## 2018-11-17 ENCOUNTER — Encounter: Payer: Self-pay | Admitting: Family Medicine

## 2018-11-17 ENCOUNTER — Telehealth (INDEPENDENT_AMBULATORY_CARE_PROVIDER_SITE_OTHER): Payer: BC Managed Care – PPO | Admitting: Family Medicine

## 2018-11-17 DIAGNOSIS — I10 Essential (primary) hypertension: Secondary | ICD-10-CM | POA: Diagnosis not present

## 2018-11-17 NOTE — Patient Instructions (Signed)
Increase the norvasc to 5 mg daily (2 tablets daily in the morning)  Follow up with your doctor in 1-2 weeks, sooner if worsening or other concerns.

## 2018-11-17 NOTE — Progress Notes (Signed)
Virtual Visit via Telephone Note  I connected with Patricia Reed on 11/17/18 at 11:00 AM EDT by telephone and verified that I am speaking with the correct person using two identifiers.   I discussed the limitations, risks, security and privacy concerns of performing an evaluation and management service by telephone and the availability of in person appointments. I also discussed with the patient that there may be a patient responsible charge related to this service. The patient expressed understanding and agreed to proceed.  Location patient: home Location provider: work or home office Participants present for the call: patient, provider Patient did not have a visit in the prior 7 days to address this/these issue(s).   History of Present Illness:  Acute visit for Hypertension. BP has been running high and she saw her PCP 6 days ago and BP was in the 130-150s/80-90s. PCP started norvasc 2.5mg  daily. She started it and is taking in the morning. She wants to hurry up and get her BP down so that she can start a new rheumatologically medication with her rheumatologist. Denies CP, SOB, DOE, swelling. She doesn't feel like BP has changed much since starting the norvasc a few days ago.    Observations/Objective: Patient sounds cheerful and well on the phone. I do not appreciate any SOB. Speech and thought processing are grossly intact. Patient reported vitals:  Assessment and Plan:  Hypertension  Discussed options and risks. Opted to increase norvasc to 5mg  daily. She wants a follow up soon with her PCP in case it is not working and I sent a message to schedulers to schedule in 1-2 weeks. Advised her to follow up or seek care sooner in the interim if worsening or any other concerns.  Follow Up Instructions: I did not refer this patient for an OV in the next 24 hours for this/these issue(s).  I discussed the assessment and treatment plan with the patient. The patient was provided an  opportunity to ask questions and all were answered. The patient agreed with the plan and demonstrated an understanding of the instructions.   The patient was advised to call back or seek an in-person evaluation if the symptoms worsen or if the condition fails to improve as anticipated.  I provided 11 minutes of non-face-to-face time during this encounter.   Lucretia Kern, DO

## 2018-11-25 ENCOUNTER — Other Ambulatory Visit: Payer: Self-pay

## 2018-11-25 ENCOUNTER — Ambulatory Visit: Payer: BC Managed Care – PPO | Admitting: Family Medicine

## 2018-11-25 ENCOUNTER — Encounter: Payer: Self-pay | Admitting: Family Medicine

## 2018-11-25 VITALS — BP 150/90 | Temp 97.7°F | Resp 16 | Ht 65.0 in | Wt 226.6 lb

## 2018-11-25 DIAGNOSIS — I10 Essential (primary) hypertension: Secondary | ICD-10-CM

## 2018-11-25 DIAGNOSIS — Z6837 Body mass index (BMI) 37.0-37.9, adult: Secondary | ICD-10-CM | POA: Diagnosis not present

## 2018-11-25 DIAGNOSIS — M797 Fibromyalgia: Secondary | ICD-10-CM | POA: Diagnosis not present

## 2018-11-25 DIAGNOSIS — E669 Obesity, unspecified: Secondary | ICD-10-CM | POA: Insufficient documentation

## 2018-11-25 MED ORDER — AMLODIPINE BESYLATE 10 MG PO TABS: 10.0000 mg | ORAL_TABLET | Freq: Every day | ORAL | 3 refills | Status: DC

## 2018-11-25 MED ORDER — BENAZEPRIL HCL 10 MG PO TABS: 10.0000 mg | ORAL_TABLET | Freq: Every day | ORAL | 2 refills | Status: DC

## 2018-11-25 NOTE — Progress Notes (Addendum)
HPI:   Patricia Reed is a 58 y.o. female, who is here today for HTN follow up. She was last seen on 11/11/18.  Currently on Amlodipine 10 mg daily. She is tolerating medication well.  She has not noted unusual headache, visual changes, exertional chest pain, dyspnea,  focal weakness, or edema.  BP readings: 130-140's/90-100's, a few SBP's in the low 100's.  Lab Results  Component Value Date   CREATININE 0.73 10/12/2018   BUN 13 10/12/2018   NA 141 10/12/2018   K 3.9 10/12/2018   CL 105 10/12/2018   CO2 28 10/12/2018   She is frustrated about not being able to get her "shots" from her rheumatologist. She is having pain "all over." Hx of fibromyalgia. RA , she is on Methotrexate 15 mg q 2 weeks.  Upper back pain,severe.  She thinks pain is causing BP elevation. She is not taking OTC medications.   Not able to exercise regularly and has not been consistent with following a healthful diet. Decreased salt intake.   Review of Systems  Constitutional: Negative for activity change, appetite change, fatigue and fever.  HENT: Negative for mouth sores and nosebleeds.   Eyes: Negative for pain and redness.  Respiratory: Negative for cough and wheezing.   Gastrointestinal: Negative for abdominal pain, nausea and vomiting.  Endocrine: Negative for cold intolerance and heat intolerance.  Genitourinary: Negative for decreased urine volume and hematuria.  Neurological: Negative for syncope, facial asymmetry and speech difficulty.  Psychiatric/Behavioral: Negative for confusion. The patient is nervous/anxious.   Rest of ROS see pertinent positives and negatives in HPI.   Current Outpatient Medications on File Prior to Visit  Medication Sig Dispense Refill  . acetaminophen (TYLENOL) 650 MG CR tablet Take 650 mg by mouth daily.     Marland Kitchen. albuterol (PROVENTIL) (2.5 MG/3ML) 0.083% nebulizer solution Take 3 mLs (2.5 mg total) by nebulization every 6 (six) hours as needed for  wheezing or shortness of breath. 75 mL 1  . albuterol (VENTOLIN HFA) 108 (90 Base) MCG/ACT inhaler Inhale 2 puffs into the lungs every 6 (six) hours as needed for wheezing or shortness of breath. 6.7 g 1  . aspirin 81 MG tablet Take 81 mg by mouth daily.    . B-D INS SYR ULTRAFINE 1CC/30G 30G X 1/2" 1 ML MISC     . baclofen (LIORESAL) 10 MG tablet Take 1 tablet (10 mg total) by mouth at bedtime as needed for muscle spasms. 30 each 0  . diclofenac sodium (VOLTAREN) 1 % GEL Apply 3 g to 3 large joints up to 3 times daily. 3 Tube 3  . fluticasone (FLONASE) 50 MCG/ACT nasal spray Place 2 sprays into both nostrils daily. (Patient taking differently: Place 2 sprays into both nostrils as needed. ) 16 g 6  . folic acid (FOLVITE) 1 MG tablet TAKE 2 TABLETS (2 MG TOTAL) BY MOUTH DAILY. 180 tablet 3  . methotrexate 50 MG/2ML injection Inject 0.6 mLs (15 mg total) into the skin once a week. 8 mL 0  . pantoprazole (PROTONIX) 40 MG tablet TAKE 1 TABLET BY MOUTH EVERY DAY 90 tablet 1  . predniSONE (DELTASONE) 5 MG tablet Take 2.5 tablets by mouth daily.    . Tuberculin-Allergy Syringes (ALLERGY SYRINGE 1CC/27GX1/2") 27G X 1/2" 1 ML MISC Patient to use to inject MTX weekly 12 each 3  . valACYclovir (VALTREX) 500 MG tablet TAKE 1 TABLET (500 MG TOTAL) BY MOUTH 2 (TWO) TIMES DAILY. FOR 3  DAYS WITH ACUTE EPISODES. 18 tablet 0  . vitamin B-12 (CYANOCOBALAMIN) 100 MCG tablet Take 100 mcg by mouth daily.    Marland Kitchen VITAMIN D, ERGOCALCIFEROL, PO Take 1 capsule by mouth daily.    [name] XR 11 MG TB24 Take 1 tablet by mouth daily.     No current facility-administered medications on file prior to visit.      Past Medical History:  Diagnosis Date  . Allergy   . Anemia   . Asthma   . Blood transfusion without reported diagnosis   . Fibromyalgia   . GERD (gastroesophageal reflux disease)   . Heart murmur   . High cholesterol   . History of stomach ulcers   . Hypertension   . Lupus (HCC)   . MI, old   . Rheumatoid  arthritis (HCC)   . Thyroid disease   . Thyroid mass   . Vertigo    Allergies  Allergen Reactions  . Aspirin Other (See Comments)    GI bleed  . Hydrocodone-Acetaminophen Hives and Nausea And Vomiting  . Ibuprofen Other (See Comments)    GI bleed  . Imdur [Isosorbide Nitrate] Other (See Comments)    Reaction unknown  . Meperidine Hcl Hives and Nausea And Vomiting    Short term memory loss  . Morphine Hives and Nausea And Vomiting  . Oxycodone-Acetaminophen Hives and Nausea And Vomiting    Reaction unknown  . Procaine Hcl Other (See Comments)    Ineffective  . Toprol Xl [Metoprolol] Other (See Comments)    Reaction unknown  . Tramadol Itching    Social History   Socioeconomic History  . Marital status: Married    Spouse name: Not on file  . Number of children: 4  . Years of education: Not on file  . Highest education level: Not on file  Occupational History  . Occupation: Lobbyist: [employer]  Social Needs  . Financial resource strain: Not on file  . Food insecurity    Worry: Not on file    Inability: Not on file  . Transportation needs    Medical: Not on file    Non-medical: Not on file  Tobacco Use  . Smoking status: Never Smoker  . Smokeless tobacco: Never Used  Substance and Sexual Activity  . Alcohol use: Yes    Alcohol/week: 1.0 standard drinks    Types: 1 Glasses of wine per week    Comment: occ  . Drug use: No  . Sexual activity: Yes    Birth control/protection: None, Surgical    Comment: LAVH  Lifestyle  . Physical activity    Days per week: Not on file    Minutes per session: Not on file  . Stress: Not on file  Relationships  . Social Musician on phone: Not on file    Gets together: Not on file    Attends religious service: Not on file    Active member of club or organization: Not on file    Attends meetings of clubs or organizations: Not on file    Relationship status: Not on file  Other  Topics Concern  . 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      Vitals:   11/25/18 0813  BP: (!) 150/90  Resp: 16  Temp: 97.7 F (36.5 C)   Body mass index is 37.71 kg/m.  Wt Readings from Last 3 Encounters:  11/25/18 226 lb 9.6 oz (102.8 kg)  11/11/18 224 lb (101.6 kg)  11/10/18 226 lb 9.6 oz (102.8 kg)    Physical Exam  Nursing note and vitals reviewed. Constitutional: She is oriented to person, place, and time. She appears well-developed. No distress.  HENT:  Head: Normocephalic and atraumatic.  Mouth/Throat: Oropharynx is clear and moist. Mucous membranes are dry.  Eyes: Pupils are equal, round, and reactive to light. Conjunctivae are normal.  Cardiovascular: Normal rate and regular rhythm.  No murmur heard. Pulses:      Dorsalis pedis pulses are 2+ on the right side and 2+ on the left side.  Respiratory: Effort normal and breath sounds normal. No respiratory distress.  GI: Soft. She exhibits no mass. There is no hepatomegaly. There is no abdominal tenderness.  Musculoskeletal:   Tender trigger points upper back,cervical,and trapezium; bilateral.    General: No edema.  Lymphadenopathy:    She has no cervical adenopathy.  Neurological: She is alert and oriented to person, place, and time. She has normal strength. No cranial nerve deficit. Gait normal.  Skin: Skin is warm. No rash noted. No erythema.  Psychiatric: She is anxious. Well groomed, good eye contact.    ASSESSMENT AND PLAN:  Patricia Reed is a 58 yo female was here today to follow on HTN.  Orders Placed This Encounter  Procedures  . Basic Metabolic Panel    Benign essential hypertension BP has improved but still not at goal. Certainly pain could be contributing factor, discussed the risk of complications of elevated BP. Benazepril 10 mg added today. Continue Amlodipine 10 mg daily. Educated about side effects of medications.  Continue low salt diet and monitoring BP. Wt loss will help. Instructed about warning signs. BMP in 7-10 days.  -     amLODipine (NORVASC) 10 MG tablet; Take 1 tablet (10 mg total) by mouth daily. -     benazepril (LOTENSIN) 10 MG tablet; Take 1 tablet (10 mg total) by mouth daily. -     Basic Metabolic Panel; Future  Fibromyalgia Good sleep hygiene and low impact exercise. Following with rheumatologist.  Class 2 severe obesity due to excess calories with serious comorbidity and body mass index (BMI) of 37.0 to 37.9 in adult [facility]) We discussed benefits of wt loss as well as adverse effects of obesity. Consistency with healthy diet and physical activity recommended. Daily brisk walking for 15-30 min as tolerated.   Return in about 2 months (around 01/25/2019) for Lab in 7-10 days..  Oluwadamilare Tobler G. [name], MD  [facility]. [place] office.

## 2018-11-25 NOTE — Patient Instructions (Signed)
A few things to remember from today's visit:   Benign essential hypertension - Plan: amLODipine (NORVASC) 10 MG tablet, benazepril (LOTENSIN) 10 MG tablet, Basic Metabolic Panel  Continue Amlodipine 10 mg daily. Benazepril 10 mg added today. Continue monitoring blood pressure.  Lab in 7-10 days.   Please be sure medication list is accurate. If a new problem present, please set up appointment sooner than planned today.

## 2018-12-02 ENCOUNTER — Other Ambulatory Visit: Payer: BC Managed Care – PPO

## 2018-12-02 ENCOUNTER — Other Ambulatory Visit: Payer: Self-pay

## 2018-12-02 ENCOUNTER — Encounter: Payer: Self-pay | Admitting: Family Medicine

## 2018-12-02 DIAGNOSIS — I10 Essential (primary) hypertension: Secondary | ICD-10-CM

## 2018-12-02 NOTE — Progress Notes (Signed)
This encounter was created in error - please disregard.

## 2018-12-03 ENCOUNTER — Other Ambulatory Visit: Payer: Self-pay | Admitting: Family Medicine

## 2018-12-03 DIAGNOSIS — R519 Headache, unspecified: Secondary | ICD-10-CM

## 2018-12-03 LAB — BASIC METABOLIC PANEL
BUN: 16 mg/dL (ref 7–25)
CO2: 29 mmol/L (ref 20–32)
Calcium: 9.5 mg/dL (ref 8.6–10.4)
Chloride: 102 mmol/L (ref 98–110)
Creat: 0.68 mg/dL (ref 0.50–1.05)
Glucose, Bld: 87 mg/dL (ref 65–99)
Potassium: 3.7 mmol/L (ref 3.5–5.3)
Sodium: 139 mmol/L (ref 135–146)

## 2018-12-04 ENCOUNTER — Emergency Department (HOSPITAL_BASED_OUTPATIENT_CLINIC_OR_DEPARTMENT_OTHER)
Admission: EM | Admit: 2018-12-04 | Discharge: 2018-12-04 | Disposition: A | Discharge status: Home / Self Care | Payer: BC Managed Care – PPO | Attending: Emergency Medicine | Admitting: Emergency Medicine

## 2018-12-04 ENCOUNTER — Encounter (HOSPITAL_BASED_OUTPATIENT_CLINIC_OR_DEPARTMENT_OTHER): Payer: Self-pay | Admitting: Emergency Medicine

## 2018-12-04 ENCOUNTER — Other Ambulatory Visit: Payer: Self-pay

## 2018-12-04 DIAGNOSIS — M5431 Sciatica, right side: Secondary | ICD-10-CM

## 2018-12-04 DIAGNOSIS — M545 Low back pain: Secondary | ICD-10-CM | POA: Diagnosis present

## 2018-12-04 DIAGNOSIS — Z79899 Other long term (current) drug therapy: Secondary | ICD-10-CM | POA: Diagnosis not present

## 2018-12-04 DIAGNOSIS — Z7982 Long term (current) use of aspirin: Secondary | ICD-10-CM | POA: Diagnosis not present

## 2018-12-04 DIAGNOSIS — I10 Essential (primary) hypertension: Secondary | ICD-10-CM | POA: Insufficient documentation

## 2018-12-04 MED ORDER — KETOROLAC TROMETHAMINE 60 MG/2ML IM SOLN
30.0000 mg | Freq: Once | INTRAMUSCULAR | Status: AC
  Administered 2018-12-04: 30 mg via INTRAMUSCULAR
  Filled 2018-12-04: qty 2

## 2018-12-04 NOTE — Discharge Instructions (Addendum)
You may use over-the-counter Acetaminophen (Tylenol), topical muscle creams such as SalonPas, Icy Hot, Bengay, etc. Please stretch, apply heat, and have massage therapy for additional assistance.  

## 2018-12-04 NOTE — ED Triage Notes (Signed)
Reports sciatica on the right side that has flared up since Thursday.  Taking Tylenol arthritis at home with no relief.  Reports difficulty even turning over in the bed.

## 2018-12-04 NOTE — ED Provider Notes (Signed)
[facility] EMERGENCY DEPARTMENT Provider Note  CSN: [id number] Arrival date & time: 12/04/18 0141  Chief Complaint(s) Sciatica  HPI Patricia Reed is a 58 y.o. female with extensive past medical history listed below including connective tissue disorder, fibromyalgia who presents to the emergency department with 3 days of gradually worsening right lumbosacral pain with radiation to her thigh.  Similar to prior sciatic flares.  Has attempted to take Tylenol at home without relief.  Pain is exacerbated with movement, certain positions, and palpation.  No alleviating factors.  No recent trauma or falls.  No bladder/bowel incontinence, lower extremity weakness, or loss of sensation.  HPI  Past Medical History Past Medical History:  Diagnosis Date  . Allergy   . Anemia   . Asthma   . Blood transfusion without reported diagnosis   . Fibromyalgia   . GERD (gastroesophageal reflux disease)   . Heart murmur   . High cholesterol   . History of stomach ulcers   . Hypertension   . Lupus (HCC)   . MI, old   . Rheumatoid arthritis (HCC)   . Thyroid disease   . Thyroid mass   . Vertigo    Patient Active Problem List   Diagnosis Date Noted  . Class 2 obesity with body mass index (BMI) of 37.0 to 37.9 in adult 11/25/2018  . Mixed connective tissue disease (HCC) 08/14/2017  . Recurrent genital herpes 05/12/2017  . Shortness of breath 04/15/2017  . Dizziness 02/04/2017  . Situational anxiety 06/08/2016  . Atypical chest pain 06/08/2016  . Trapezius muscle spasm 04/23/2016  . Fibromyalgia 02/13/2016  . 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 Chronic fatigue 02/13/2016  . Vitamin D deficiency 11/25/2015  . Autoimmune disease (HCC) 11/23/2015  . High risk medication use 11/23/2015  . Colon cancer screening 01/25/2014  . Anterior neck pain 12/01/2013  . Inflammatory arthritis 08/18/2012  . Allergic rhinitis 06/23/2012  . HTN (hypertension) 06/23/2012  . High cholesterol  06/23/2012  . Thyroid nodule - benign 06/23/2012  . Arthralgia 06/23/2012  . Mild intermittent asthma   . Sickle cell trait (HCC)   . History of stomach ulcers   . ESOPHAGEAL STRICTURE 10/04/2007  . GERD 10/04/2007  . 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 Dysphagia, pharyngoesophageal phase 10/04/2007   Home Medication(s) Prior to Admission medications   Medication Sig Start Date End Date Taking? Authorizing Provider  acetaminophen (TYLENOL) 650 MG CR tablet Take 650 mg by mouth daily.    Yes [provider]  amLODipine (NORVASC) 10 MG tablet Take 1 tablet (10 mg total) by mouth daily. 11/25/18  Yes [place], [name], MD  aspirin 81 MG tablet Take 81 mg by mouth daily.   Yes [provider]  benazepril (LOTENSIN) 10 MG tablet Take 1 tablet (10 mg total) by mouth daily. 11/25/18  Yes [place], [name], MD  diclofenac sodium (VOLTAREN) 1 % GEL Apply 3 g to 3 large joints up to 3 times daily. 05/11/17  Yes Gearldine Bienenstock, PA-C  folic acid (FOLVITE) 1 MG tablet TAKE 2 TABLETS (2 MG TOTAL) BY MOUTH DAILY. 07/13/18  Yes Deveshwar, Janalyn Rouse, MD  methotrexate 50 MG/2ML injection Inject 0.6 mLs (15 mg total) into the skin once a week. 02/22/18  Yes Deveshwar, Janalyn Rouse, MD  pantoprazole (PROTONIX) 40 MG tablet TAKE 1 TABLET BY MOUTH EVERY DAY 09/29/18  Yes [place], [name], MD  predniSONE (DELTASONE) 5 MG tablet Take 2.5 tablets by mouth daily. 04/05/18  Yes [provider]  valACYclovir (  VALTREX) 500 MG tablet TAKE 1 TABLET (500 MG TOTAL) BY MOUTH 2 (TWO) TIMES DAILY. FOR 3 DAYS WITH ACUTE EPISODES. 08/30/18  Yes [name], [name], MD  vitamin B-12 (CYANOCOBALAMIN) 100 MCG tablet Take 100 mcg by mouth daily.   Yes [provider]  VITAMIN D, ERGOCALCIFEROL, PO Take 1 capsule by mouth daily.   Yes [provider]  XELJANZ XR 11 MG TB24 Take 1 tablet by mouth daily. 03/02/18  Yes [provider]  albuterol (PROVENTIL) (2.5 MG/3ML) 0.083% nebulizer solution Take 3 mLs (2.5  mg total) by nebulization every 6 (six) hours as needed for wheezing or shortness of breath. 08/19/18   Martyn Ehrich, NP  albuterol (VENTOLIN HFA) 108 (90 Base) MCG/ACT inhaler Inhale 2 puffs into the lungs every 6 (six) hours as needed for wheezing or shortness of breath. 08/19/18   Martyn Ehrich, NP  B-D INS SYR ULTRAFINE 1CC/30G 30G X 1/2" 1 ML MISC  03/05/18   [provider]  baclofen (LIORESAL) 10 MG tablet Take 1 tablet (10 mg total) by mouth at bedtime as needed for muscle spasms. 11/11/18   [name], [name], MD  fluticasone (FLONASE) 50 MCG/ACT nasal spray Place 2 sprays into both nostrils daily. Patient taking differently: Place 2 sprays into both nostrils as needed.  02/22/17   Nafziger, Tommi Rumps, NP  Tuberculin-Allergy Syringes (ALLERGY SYRINGE 1CC/27GX1/2") 27G X 1/2" 1 ML MISC Patient to use to inject MTX weekly 08/21/16   Bo Merino, MD                                                                                                                                    Past Surgical History Past Surgical History:  Procedure Laterality Date  . ABDOMINAL HYSTERECTOMY     done for menorrhagia, ovaries remain  . CERVICAL DISCECTOMY     plates and screws in neck  . CESAREAN SECTION    . COLONOSCOPY    . ESOPHAGEAL MANOMETRY N/A 02/12/2014   Procedure: ESOPHAGEAL MANOMETRY (EM);  Surgeon: Inda Castle, MD;  Location: [facility];  Service: Endoscopy;  Laterality: N/A;  . ESOPHAGOGASTRODUODENOSCOPY  03/04/2011   Procedure: ESOPHAGOGASTRODUODENOSCOPY (EGD);  Surgeon: Inda Castle, MD;  Location: [facility];  Service: Endoscopy;  Laterality: N/A;  BOTOX Injection  . exc benign breast lump    . TONSILLECTOMY    . UPPER GASTROINTESTINAL ENDOSCOPY     Family History Family History  Problem Relation Age of Onset  . Alcohol abuse Father   . Hypertension Father   . Heart disease Father   . Colon cancer Maternal Uncle   . Diabetes Maternal Grandmother   . Colon  polyps Neg Hx   . Esophageal cancer Neg Hx   . Kidney disease Neg Hx   . Stomach cancer Neg Hx   . Rectal cancer Neg Hx     Social History Social History   Tobacco  Use  . Smoking status: Never Smoker  . Smokeless tobacco: Never Used  Substance Use Topics  . Alcohol use: Yes    Alcohol/week: 1.0 standard drinks    Types: 1 Glasses of wine per week    Comment: occ  . Drug use: No   Allergies Aspirin, Hydrocodone-acetaminophen, Ibuprofen, Imdur [isosorbide nitrate], Meperidine hcl, Morphine, Oxycodone-acetaminophen, Procaine hcl, Toprol xl [metoprolol], and Tramadol  Review of Systems Review of Systems as noted in the HPI  Physical Exam Vital Signs  I have reviewed the triage vital signs BP (!) 132/109 (BP Location: Right Arm)   Pulse 96   Temp 98.3 F (36.8 C) (Oral)   Resp 18   Ht 5\' 5"  (1.651 m)   Wt 101.2 kg   SpO2 100%   BMI 37.11 kg/m   Physical Exam Vitals signs reviewed.  Constitutional:      General: She is not in acute distress.    Appearance: She is well-developed. She is obese. She is not diaphoretic.  HENT:     Head: Normocephalic and atraumatic.     Right Ear: External ear normal.     Left Ear: External ear normal.     Nose: Nose normal.  Eyes:     General: No scleral icterus.    Conjunctiva/sclera: Conjunctivae normal.  Neck:     Musculoskeletal: Normal range of motion.     Trachea: Phonation normal.  Cardiovascular:     Rate and Rhythm: Normal rate and regular rhythm.  Pulmonary:     Effort: Pulmonary effort is normal. No respiratory distress.     Breath sounds: No stridor.  Abdominal:     General: There is no distension.  Musculoskeletal: Normal range of motion.     Lumbar back: She exhibits tenderness. She exhibits no bony tenderness.       Back:  Neurological:     Mental Status: She is alert and oriented to person, place, and time.  Psychiatric:        Behavior: Behavior normal.     ED Results and Treatments Labs (all labs  ordered are listed, but only abnormal results are displayed) Labs Reviewed - No data to display                                                                                                                       EKG  EKG Interpretation  Date/Time:    Ventricular Rate:    PR Interval:    QRS Duration:   QT Interval:    QTC Calculation:   R Axis:     Text Interpretation:        Radiology No results found.  Pertinent labs & imaging results that were available during my care of the patient were reviewed by me and considered in my medical decision making (see chart for details).  Medications Ordered in ED Medications  ketorolac (TORADOL) injection 30 mg (30 mg Intramuscular Given 12/04/18 0252)  Procedures Procedures  (including critical care time)  Medical Decision Making / ED Course I have reviewed the nursing notes for this encounter and the patient's prior records (if available in EHR or on provided paperwork).   Patricia Reed was evaluated in Emergency Department on 12/04/2018 for the symptoms described in the history of present illness. She was evaluated in the context of the global COVID-19 pandemic, which necessitated consideration that the patient might be at risk for infection with the SARS-CoV-2 virus that causes COVID-19. Institutional protocols and algorithms that pertain to the evaluation of patients at risk for COVID-19 are in a state of rapid change based on information released by regulatory bodies including the CDC and federal and state organizations. These policies and algorithms were followed during the patient's care in the ED.  58 y.o. female presents with back pain in lumbosacral area for 3 days with signs of radicular pain. No acute traumatic onset. No red flag symptoms of fever, weight loss, saddle anesthesia, weakness,  fecal/urinary incontinence or urinary retention.   Suspect MSK vs radiculopathy etiology.   No indication for imaging emergently. Patient was recommended to engage in early mobility as definitive treatment. Return precautions discussed for worsening or new concerning symptoms.        Final Clinical Impression(s) / ED Diagnoses Final diagnoses:  Right sided sciatica     The patient appears reasonably screened and/or stabilized for discharge and I doubt any other medical condition or other [facility] requiring further screening, evaluation, or treatment in the ED at this time prior to discharge.  Disposition: Discharge  Condition: Good  I have discussed the results, Dx and Tx plan with the patient who expressed understanding and agree(s) with the plan. Discharge instructions discussed at great length. The patient was given strict return precautions who verbalized understanding of the instructions. No further questions at time of discharge.    ED Discharge Orders    None        Follow Up: [place], [name], MD [address] [place] [address] [phone]  Schedule an appointment as soon as possible for a visit  As needed     This chart was dictated using voice recognition software.  Despite best efforts to proofread,  errors can occur which can change the documentation meaning.   Nira Conn, MD 12/04/18 [phone]

## 2018-12-06 ENCOUNTER — Encounter: Payer: Self-pay | Admitting: Family Medicine

## 2018-12-27 ENCOUNTER — Other Ambulatory Visit: Payer: Self-pay | Admitting: Family Medicine

## 2018-12-27 ENCOUNTER — Other Ambulatory Visit: Payer: Self-pay | Admitting: Rheumatology

## 2018-12-27 DIAGNOSIS — R519 Headache, unspecified: Secondary | ICD-10-CM

## 2018-12-27 MED ORDER — METHOTREXATE SODIUM CHEMO INJECTION 50 MG/2ML: 15.0000 mg | INTRAMUSCULAR | 0 refills | Status: DC

## 2018-12-27 NOTE — Telephone Encounter (Signed)
Last Visit: 11/10/2018 Next Visit: 04/13/2019 Labs: 10/12/2018 Hgb borderline low. Rest of CBC WNL. We will continue to monitor. Glucose is 110. Rest of CMP WNL.   Okay to refill per Dr. Estanislado Pandy.

## 2018-12-28 ENCOUNTER — Telehealth: Payer: Self-pay | Admitting: Rheumatology

## 2018-12-28 ENCOUNTER — Other Ambulatory Visit: Payer: Self-pay

## 2018-12-28 DIAGNOSIS — Z79899 Other long term (current) drug therapy: Secondary | ICD-10-CM

## 2018-12-28 MED ORDER — METHOTREXATE SODIUM CHEMO INJECTION 50 MG/2ML: 15.0000 mg | INTRAMUSCULAR | 0 refills | Status: DC

## 2018-12-28 NOTE — Telephone Encounter (Signed)
Lab orders released for [facility].   Meth[facility]ate refill sent to [facility] on [address] per patients MyChart request on 12/27/2018. I have [facility] [address].   Attempted to contact patient and left message on machine to advise patient orders have been released, prescription has been re-sent and to call back for a virtual visit if needed for her hand.

## 2018-12-28 NOTE — Telephone Encounter (Signed)
Patient going to [facility] for labs. Please release orders.   Patient needs refill on MTX sent to [facility] on [address]. Patient overdue for medication. Did not take it last Saturday.   Patient did not go to work today due to hand being cramped up.

## 2019-01-03 ENCOUNTER — Telehealth: Payer: Self-pay

## 2019-01-03 NOTE — Telephone Encounter (Signed)
[facility] sent over a fax advising [place] that the patient has multiple rescue inhaler fills without filling a controller medication at the [facility] in the last 180 days. They would like to know if we considered starting her on a maintenance medication. Please advise.

## 2019-01-03 NOTE — Telephone Encounter (Signed)
Spoke with pt, she is refusing to start a maintenance inhaler at this time because she is not using the albuterol rescue inhaler as much. She doesn't understand why the pharmacy sent over the fax but I advised her that if she was using the albuterol more often, she should call us and we can send in a maintenance inhaler. Pt agreed and nothing further is needed.

## 2019-01-03 NOTE — Telephone Encounter (Signed)
Please send in Symbicort 80 - two puffs twice daily And make follow-up televisit in 2-4 weeks with Dr. Burnis Kingfisher or NP

## 2019-01-10 ENCOUNTER — Other Ambulatory Visit: Payer: Self-pay

## 2019-01-11 ENCOUNTER — Ambulatory Visit: Payer: BC Managed Care – PPO | Admitting: Family Medicine

## 2019-01-11 ENCOUNTER — Encounter: Payer: Self-pay | Admitting: Family Medicine

## 2019-01-11 VITALS — BP 120/70 | HR 70 | Resp 12 | Ht 65.0 in | Wt 219.0 lb

## 2019-01-11 DIAGNOSIS — I10 Essential (primary) hypertension: Secondary | ICD-10-CM | POA: Diagnosis not present

## 2019-01-11 DIAGNOSIS — M351 Other overlap syndromes: Secondary | ICD-10-CM | POA: Diagnosis not present

## 2019-01-11 DIAGNOSIS — M797 Fibromyalgia: Secondary | ICD-10-CM

## 2019-01-11 DIAGNOSIS — D573 Sickle-cell trait: Secondary | ICD-10-CM

## 2019-01-11 DIAGNOSIS — A6000 Herpesviral infection of urogenital system, unspecified: Secondary | ICD-10-CM

## 2019-01-11 MED ORDER — BENAZEPRIL HCL 10 MG PO TABS: 10.0000 mg | ORAL_TABLET | Freq: Every day | ORAL | 2 refills | Status: DC

## 2019-01-11 MED ORDER — LIDOCAINE 4 % EX CREA: 1.0000 "application " | TOPICAL_CREAM | Freq: Three times a day (TID) | CUTANEOUS | 0 refills | Status: AC | PRN

## 2019-01-11 MED ORDER — VALACYCLOVIR HCL 500 MG PO TABS: ORAL_TABLET | ORAL | 0 refills | Status: DC

## 2019-01-11 NOTE — Progress Notes (Signed)
[name] is a 58 y.o.female, who is here today to follow up. Last follow up visit: 11/25/18.  -ZCH:[id number] on amlodipine 10 mg daily and benazepril 10 mg daily.. She is taking medications as instructed, no side effects reported.  She has not noted unusual headache, visual changes, exertional chest pain, dyspnea,  focal weakness, or edema. Home BP readings: 120s/70s.   Lab Results  Component Value Date   CREATININE 0.68 12/02/2018   BUN 16 12/02/2018   NA 139 12/02/2018   K 3.7 12/02/2018   CL 102 12/02/2018   CO2 29 12/02/2018   -Mixed connective tissue disease and fibromyalgia: She was hoping to get trigger point injections from her rheumatologist after her BP was better controlled but according the patient, she called and the rheumatologist office is closed due to COVID-19 pandemia.She is asking if I can do it here in the office. She is on Xeljanz 11 mg XR daily, Prednisone 2.5 mg daily,and Methotrexate 0.6 ml once weekly.  Upper back pain, exacerbated by shoulder range of motion. Right-handed. Bilateral hip pain and back pain. Pain is exacerbated by movement and alleviated by rest. Currently she is on Voltaren gel 4 times daily as needed and Tylenol 650 mg daily.  Negative for fever, chills, unusual fatigue, local erythema or edema, or skin rash.  -Recurrent genital herpes: 4 days ago she noted a very painful lesion on left buttock, close to rectum. She has lesions in same place usually. Pain is constant, exacerbated by wiping after bowel movements, walking, and sitting.  She did not have Valtrex. She has applied OTC Neosporin oint.   Review of Systems  Constitutional: Positive for fatigue. Negative for activity change, appetite change and fever.  HENT: Negative for mouth sores, nosebleeds and trouble swallowing.   Respiratory: Negative for cough and wheezing.   Gastrointestinal: Negative for abdominal pain, nausea and vomiting.       Negative for  changes in bowel habits.  Genitourinary: Negative for decreased urine volume, dysuria and hematuria.  Musculoskeletal: Negative for gait problem and joint swelling.  Neurological: Negative for syncope and facial asymmetry.  Psychiatric/Behavioral: Negative for confusion.  Rest see pertinent positives and negatives per HPI.  Current Outpatient Medications on File Prior to Visit  Medication Sig Dispense Refill  . acetaminophen (TYLENOL) 650 MG CR tablet Take 650 mg by mouth daily.     Marland Kitchen albuterol (VENTOLIN HFA) 108 (90 Base) MCG/ACT inhaler Inhale 2 puffs into the lungs every 6 (six) hours as needed for wheezing or shortness of breath. 6.7 g 1  . amLODipine (NORVASC) 10 MG tablet Take 1 tablet (10 mg total) by mouth daily. 90 tablet 3  . aspirin 81 MG tablet Take 81 mg by mouth daily.    . B-D INS SYR ULTRAFINE 1CC/30G 30G X 1/2" 1 ML MISC     . baclofen (LIORESAL) 10 MG tablet TAKE 1 TABLET BY MOUTH AT BEDTIME AS NEEDED FOR MUSCLE SPASMS 30 tablet 0  . diclofenac sodium (VOLTAREN) 1 % GEL Apply 3 g to 3 large joints up to 3 times daily. 3 Tube 3  . folic acid (FOLVITE) 1 MG tablet TAKE 2 TABLETS (2 MG TOTAL) BY MOUTH DAILY. 180 tablet 3  . methotrexate 50 MG/2ML injection Inject 0.6 mLs (15 mg total) into the skin once a week. 8 mL 0  . pantoprazole (PROTONIX) 40 MG tablet TAKE 1 TABLET BY MOUTH EVERY DAY 90 tablet 1  . predniSONE (DELTASONE) 5 MG tablet  Take 2.5 tablets by mouth daily.    . Tuberculin-Allergy Syringes (ALLERGY SYRINGE 1CC/27GX1/2") 27G X 1/2" 1 ML MISC Patient to use to inject MTX weekly 12 each 3  . vitamin B-12 (CYANOCOBALAMIN) 100 MCG tablet Take 100 mcg by mouth daily.    Marland Kitchen VITAMIN D, ERGOCALCIFEROL, PO Take 1 capsule by mouth daily.    Patricia Reed XR 11 MG TB24 Take 1 tablet by mouth daily.     No current facility-administered medications on file prior to visit.     Past Medical History:  Diagnosis Date  . Allergy   . Anemia   . Asthma   . Blood transfusion  without reported diagnosis   . Fibromyalgia   . GERD (gastroesophageal reflux disease)   . Heart murmur   . High cholesterol   . History of stomach ulcers   . Hypertension   . Lupus ([place])   . MI, old   . Rheumatoid arthritis ([place])   . Thyroid disease   . Thyroid mass   . Vertigo     Allergies  Allergen Reactions  . Aspirin Other (See Comments)    GI bleed  . Hydrocodone-Acetaminophen Hives and Nausea And Vomiting  . Ibuprofen Other (See Comments)    GI bleed  . Imdur [Isosorbide Nitrate] Other (See Comments)    Reaction unknown  . Meperidine Hcl Hives and Nausea And Vomiting    Short term memory loss  . Morphine Hives and Nausea And Vomiting  . Oxycodone-Acetaminophen Hives and Nausea And Vomiting    Reaction unknown  . Procaine Hcl Other (See Comments)    Ineffective  . Toprol Xl [Metoprolol] Other (See Comments)    Reaction unknown  . Tramadol Itching    Social History   Socioeconomic History  . Marital status: Married    Spouse name: Not on file  . Number of children: 4  . Years of education: Not on file  . Highest education level: Not on file  Occupational History  . Occupation: Financial planner: [employer]  Tobacco Use  . Smoking status: Never Smoker  . Smokeless tobacco: Never Used  Substance and Sexual Activity  . Alcohol use: Yes    Alcohol/week: 1.0 standard drinks    Types: 1 Glasses of wine per week    Comment: occ  . Drug use: No  . Sexual activity: Yes    Birth control/protection: None, Surgical    Comment: LAVH  Other Topics Concern  . Not on file  Social History Narrative   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     Social Determinants of Health   Financial Resource Strain:   . Difficulty of Paying Living Expenses: Not on file  Food Insecurity:   . Worried About Charity fundraiser in the Last Year: Not on file  . Ran Out of Food in the Last Year: Not on file    Transportation Needs:   . Lack of Transportation (Medical): Not on file  . Lack of Transportation (Non-Medical): Not on file  Physical Activity:   . Days of Exercise per Week: Not on file  . Minutes of Exercise per Session: Not on file  Stress:   . Feeling of Stress : Not on file  Social Connections:   . Frequency of Communication with Friends and Family: Not on file  . Frequency of Social Gatherings with Friends and Family: Not on file  . Attends Religious Services:  Not on file  . Active Member of Clubs or Organizations: Not on file  . Attends Banker Meetings: Not on file  . Marital Status: Not on file   Vitals:   01/11/19 1149  BP: 120/70  Pulse: 70  Resp: 12   Body mass index is 36.44 kg/m.  Physical Exam  Nursing note and vitals reviewed. Constitutional: She is oriented to person, place, and time. She appears well-developed. No distress.  HENT:  Head: Normocephalic and atraumatic.  Mouth/Throat: Oropharynx is clear and moist and mucous membranes are normal.  Eyes: Pupils are equal, round, and reactive to light. Conjunctivae are normal.  Cardiovascular: Normal rate and regular rhythm.  No murmur heard. Pulses:      Dorsalis pedis pulses are 2+ on the right side and 2+ on the left side.  Respiratory: Effort normal and breath sounds normal. No respiratory distress.  GI: Soft. She exhibits no mass. There is no hepatomegaly. There is no abdominal tenderness.  Musculoskeletal:        General: No edema.     Left shoulder: Tenderness present. No bony tenderness. Decreased range of motion.     Cervical back: Tenderness present.       Back:  Lymphadenopathy:    She has no cervical adenopathy.  Neurological: She is alert and oriented to person, place, and time. She has normal strength. No cranial nerve deficit. Gait normal.  Skin: Skin is warm. No rash noted. No erythema.  Medial aspect of left gluteus, a few cms from perianal area, confluent vesicular  lesions. + Tender, no induration.  Psychiatric: She has a normal mood and affect.  Well groomed, good eye contact.    ASSESSMENT AND PLAN:   Ms. Rayley was seen today for follow-up.  Diagnoses and all orders for this visit:  Recurrent genital herpes Acute exacerbation. Even thought it has already been > 48 hours I thing she may still benefit from starting Valtrex at this time. Continue Valtrex 500 mg prn for acute flare ups. Topical Lidocaine may help with pain.  -     valACYclovir (VALTREX) 500 MG tablet; TAKE 1 TABLET (500 MG TOTAL) BY MOUTH 2 (TWO) TIMES DAILY. FOR 3 DAYS WITH ACUTE EPISODES. -     lidocaine (LMX) 4 % cream; Apply 1 application topically 3 (three) times daily as needed for up to 21 days.  Benign essential hypertension BP is now adequately controlled. Continue benazepril 10 mg daily and amlodipine 10 mg daily. Continue following low-salt diet and monitoring BP regularly. Eye exam is current. Follow-up in 5 months.  -     benazepril (LOTENSIN) 10 MG tablet; Take 1 tablet (10 mg total) by mouth daily.  Fibromyalgia I do not feel comfortable giving her trigger injection at this time, I am not sure about the type of injection she is receiving. Low impact exercise and good sleep hygiene will also help.  Mixed connective tissue disease (HCC) Continue following with rheumatology.  Return in about 5 months (around 06/11/2019) for HTN.    Daniyla Pfahler G. [place], MD  [facility]. [place] office.

## 2019-01-11 NOTE — Patient Instructions (Signed)
A few things to remember from today's visit:   Fibromyalgia  Recurrent genital herpes - Plan: valACYclovir (VALTREX) 500 MG tablet  No changes today.  Please be sure medication list is accurate. If a new problem present, please set up appointment sooner than planned today.

## 2019-01-18 LAB — CBC WITH DIFFERENTIAL/PLATELET
Absolute Monocytes: 170 cells/uL — ABNORMAL LOW (ref 200–950)
Basophils Absolute: 41 cells/uL (ref 0–200)
Basophils Relative: 1.1 %
Eosinophils Absolute: 19 cells/uL (ref 15–500)
Eosinophils Relative: 0.5 %
HCT: 35.3 % (ref 35.0–45.0)
Hemoglobin: 11.4 g/dL — ABNORMAL LOW (ref 11.7–15.5)
Lymphs Abs: 962 cells/uL (ref 850–3900)
MCH: 28.4 pg (ref 27.0–33.0)
MCHC: 32.3 g/dL (ref 32.0–36.0)
MCV: 87.8 fL (ref 80.0–100.0)
MPV: 11.4 fL (ref 7.5–12.5)
Monocytes Relative: 4.6 %
Neutro Abs: 2509 cells/uL (ref 1500–7800)
Neutrophils Relative %: 67.8 %
Platelets: 309 10*3/uL (ref 140–400)
RBC: 4.02 10*6/uL (ref 3.80–5.10)
RDW: 13.9 % (ref 11.0–15.0)
Total Lymphocyte: 26 %
WBC: 3.7 10*3/uL — ABNORMAL LOW (ref 3.8–10.8)

## 2019-01-18 LAB — COMPLETE METABOLIC PANEL WITH GFR
AG Ratio: 1.3 (calc) (ref 1.0–2.5)
ALT: 11 U/L (ref 6–29)
AST: 14 U/L (ref 10–35)
Albumin: 4.4 g/dL (ref 3.6–5.1)
Alkaline phosphatase (APISO): 59 U/L (ref 37–153)
BUN: 11 mg/dL (ref 7–25)
CO2: 29 mmol/L (ref 20–32)
Calcium: 9.3 mg/dL (ref 8.6–10.4)
Chloride: 103 mmol/L (ref 98–110)
Creat: 0.77 mg/dL (ref 0.50–1.05)
GFR, Est African American: 100 mL/min/{1.73_m2} (ref >=60)
GFR, Est Non African American: 86 mL/min/{1.73_m2} (ref >=60)
Globulin: 3.3 g/dL (calc) (ref 1.9–3.7)
Glucose, Bld: 112 mg/dL — ABNORMAL HIGH (ref 65–99)
Potassium: 4.1 mmol/L (ref 3.5–5.3)
Sodium: 140 mmol/L (ref 135–146)
Total Bilirubin: 0.7 mg/dL (ref 0.2–1.2)
Total Protein: 7.7 g/dL (ref 6.1–8.1)

## 2019-01-18 NOTE — Progress Notes (Signed)
Mild decrease in WBC count noted which is stable.  Glucose is mildly elevated probably nonfasting.  No change in therapy is advised.

## 2019-01-20 ENCOUNTER — Other Ambulatory Visit: Payer: Self-pay | Admitting: Family Medicine

## 2019-01-20 DIAGNOSIS — R519 Headache, unspecified: Secondary | ICD-10-CM

## 2019-01-23 ENCOUNTER — Encounter: Payer: Self-pay | Admitting: Rheumatology

## 2019-01-23 ENCOUNTER — Other Ambulatory Visit: Payer: Self-pay

## 2019-01-23 ENCOUNTER — Ambulatory Visit (INDEPENDENT_AMBULATORY_CARE_PROVIDER_SITE_OTHER): Payer: BC Managed Care – PPO | Admitting: Rheumatology

## 2019-01-23 VITALS — BP 112/79 | HR 90 | Resp 16 | Ht 65.0 in | Wt 220.8 lb

## 2019-01-23 DIAGNOSIS — Z79899 Other long term (current) drug therapy: Secondary | ICD-10-CM

## 2019-01-23 DIAGNOSIS — M351 Other overlap syndromes: Secondary | ICD-10-CM | POA: Diagnosis not present

## 2019-01-23 DIAGNOSIS — Z8719 Personal history of other diseases of the digestive system: Secondary | ICD-10-CM

## 2019-01-23 DIAGNOSIS — M7061 Trochanteric bursitis, right hip: Secondary | ICD-10-CM

## 2019-01-23 DIAGNOSIS — R5382 Chronic fatigue, unspecified: Secondary | ICD-10-CM | POA: Diagnosis not present

## 2019-01-23 DIAGNOSIS — G8929 Other chronic pain: Secondary | ICD-10-CM

## 2019-01-23 DIAGNOSIS — M25512 Pain in left shoulder: Secondary | ICD-10-CM

## 2019-01-23 DIAGNOSIS — Z8679 Personal history of other diseases of the circulatory system: Secondary | ICD-10-CM

## 2019-01-23 DIAGNOSIS — M25511 Pain in right shoulder: Secondary | ICD-10-CM

## 2019-01-23 DIAGNOSIS — M797 Fibromyalgia: Secondary | ICD-10-CM

## 2019-01-23 DIAGNOSIS — F5101 Primary insomnia: Secondary | ICD-10-CM

## 2019-01-23 DIAGNOSIS — M7062 Trochanteric bursitis, left hip: Secondary | ICD-10-CM

## 2019-01-23 DIAGNOSIS — Z8711 Personal history of peptic ulcer disease: Secondary | ICD-10-CM

## 2019-01-23 DIAGNOSIS — Z8639 Personal history of other endocrine, nutritional and metabolic disease: Secondary | ICD-10-CM

## 2019-01-23 DIAGNOSIS — Z8709 Personal history of other diseases of the respiratory system: Secondary | ICD-10-CM

## 2019-01-23 NOTE — Patient Instructions (Signed)
Standing Labs We placed an order today for your standing lab work.    Please come back and get your standing labs in 1 month   Recheck CBC to monitor WBC count   We have open lab daily Monday through Thursday from 8:30-12:30 PM and 1:30-4:30 PM and Friday from 8:30-12:30 PM and 1:30-4:00 PM at the office of Dr. Pollyann Savoy.   You may experience shorter wait times on Monday and Friday afternoons. The office is located at [address], [place], [place] [address] No appointment is necessary.   Labs are drawn by [facility].  You may receive a bill from [facility] for your lab work.  If you wish to have your labs drawn at another location, please call the office 24 hours in advance to send orders.  If you have any questions regarding directions or hours of operation,  please call [phone].   Just as a reminder please drink plenty of water prior to coming for your lab work. Thanks!

## 2019-01-23 NOTE — Progress Notes (Signed)
Office Visit Note  [name]             Date of Birth: 30-Jun-1960           MRN: [id number]             PCP: [name], [name], MD Referring: [name], [name], MD Visit Date: 01/23/2019 Occupation: @GUAROCC @  Subjective:  Pain in both shoulder joints   History of Present Illness: Patricia Reed is a 59 y.o. female with history of mixed connective tissue disorder.  She is taking xeljanz 11 mg 1 tablet daily, MTX 0.6 ml sq once weekly, and folic acid 2 mg po daily. She presents today with ongoing pain in both shoulder joints.  She is having difficulty raising her arms above her head.  She would like cortisone injections today.  Her blood pressure has been well controlled since starting on Benazepril and norvasc.  She denies any other joint pain or joint swelling currently.    Activities of Daily Living:  Patient reports morning stiffness for 4 hours.   Patient Reports nocturnal pain.  Difficulty dressing/grooming: Reports Difficulty climbing stairs: Denies Difficulty getting out of chair: Denies Difficulty using hands for taps, buttons, cutlery, and/or writing: Reports  Review of Systems  Constitutional: Positive for fatigue.  HENT: Positive for mouth dryness. Negative for mouth sores and nose dryness.   Eyes: Negative for pain, visual disturbance and dryness.  Respiratory: Negative for cough, hemoptysis, shortness of breath and difficulty breathing.   Cardiovascular: Positive for swelling in legs/feet. Negative for chest pain, palpitations and hypertension.  Gastrointestinal: Positive for constipation. Negative for blood in stool and diarrhea.  Genitourinary: Negative for difficulty urinating and painful urination.  Musculoskeletal: Positive for arthralgias, joint pain, joint swelling, muscle weakness, morning stiffness and muscle tenderness. Negative for myalgias and myalgias.  Skin: Positive for color change and rash. Negative for pallor, hair loss,  nodules/bumps, skin tightness, ulcers and sensitivity to sunlight.  Neurological: Negative for dizziness, numbness and headaches.  Hematological: Negative for swollen glands.  Psychiatric/Behavioral: Positive for sleep disturbance. Negative for depressed mood. The patient is not nervous/anxious.     PMFS History:  Patient Active Problem List   Diagnosis Date Noted  . Class 2 obesity with body mass index (BMI) of 37.0 to 37.9 in adult 11/25/2018  . Mixed connective tissue disease ([place]) 08/14/2017  . Recurrent genital herpes 05/12/2017  . Shortness of breath 04/15/2017  . Dizziness 02/04/2017  . Situational anxiety 06/08/2016  . Atypical chest pain 06/08/2016  . Trapezius muscle spasm 04/23/2016  . Fibromyalgia 02/13/2016  . 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 Chronic fatigue 02/13/2016  . Vitamin D deficiency 11/25/2015  . Autoimmune disease ([place]) 11/23/2015  . High risk medication use 11/23/2015  . Colon cancer screening 01/25/2014  . Anterior neck pain 12/01/2013  . Inflammatory arthritis 08/18/2012  . Allergic rhinitis 06/23/2012  . HTN (hypertension) 06/23/2012  . High cholesterol 06/23/2012  . Thyroid nodule - benign 06/23/2012  . Arthralgia 06/23/2012  . Mild intermittent asthma   . Sickle cell trait ([place])   . History of stomach ulcers   . ESOPHAGEAL STRICTURE 10/04/2007  . GERD 10/04/2007  . 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 Dysphagia, pharyngoesophageal phase 10/04/2007    Past Medical History:  Diagnosis Date  . Allergy   . Anemia   . Asthma   . Blood transfusion without reported diagnosis   . Fibromyalgia   . GERD (gastroesophageal reflux disease)   . Heart murmur   .  High cholesterol   . History of stomach ulcers   . Hypertension   . Lupus (HCC)   . MI, old   . Rheumatoid arthritis (HCC)   . Thyroid disease   . Thyroid mass   . Vertigo     Family History  Problem Relation Age of Onset  . Alcohol abuse Father   . Hypertension Father   . Heart disease  Father   . Colon cancer Maternal Uncle   . Diabetes Maternal Grandmother   . Colon polyps Neg Hx   . Esophageal cancer Neg Hx   . Kidney disease Neg Hx   . Stomach cancer Neg Hx   . Rectal cancer Neg Hx    Past Surgical History:  Procedure Laterality Date  . ABDOMINAL HYSTERECTOMY     done for menorrhagia, ovaries remain  . CERVICAL DISCECTOMY     plates and screws in neck  . CESAREAN SECTION    . COLONOSCOPY    . ESOPHAGEAL MANOMETRY N/A 02/12/2014   Procedure: ESOPHAGEAL MANOMETRY (EM);  Surgeon: Louis Meckel, MD;  Location: [facility];  Service: Endoscopy;  Laterality: N/A;  . ESOPHAGOGASTRODUODENOSCOPY  03/04/2011   Procedure: ESOPHAGOGASTRODUODENOSCOPY (EGD);  Surgeon: Louis Meckel, MD;  Location: [facility];  Service: Endoscopy;  Laterality: N/A;  BOTOX Injection  . exc benign breast lump    . TONSILLECTOMY    . 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     Immunization History  Administered Date(s) Administered  . Influenza, Quadrivalent, Recombinant, Inj, Pf 10/11/2017  . Influenza,inj,Quad PF,6+ Mos 11/04/2015, 10/21/2018  . Zoster Recombinat (Shingrix) 10/11/2017, 12/25/2017     Objective: Vital Signs: BP 112/79 (BP Location: Left Arm, Patient Position: Sitting, Cuff Size: Normal)   Pulse 90   Resp 16   Ht 5\' 5"  (1.651 m)   Wt 220 lb 12.8 oz (100.2 kg)   BMI 36.74 kg/m    Physical Exam Vitals and nursing note reviewed.  Constitutional:      Appearance: She is well-developed.  HENT:     Head: Normocephalic and atraumatic.  Eyes:     Conjunctiva/sclera: Conjunctivae normal.  Cardiovascular:     Rate and Rhythm: Normal rate and regular rhythm.     Heart sounds: Normal heart sounds.  Pulmonary:     Effort: Pulmonary effort is normal.     Breath sounds: Normal breath sounds.  Abdominal:     General: Bowel sounds are normal.      Palpations: Abdomen is soft.  Musculoskeletal:     Cervical back: Normal range of motion.  Lymphadenopathy:     Cervical: No cervical adenopathy.  Skin:    General: Skin is warm and dry.     Capillary Refill: Capillary refill takes less than 2 seconds.  Neurological:     Mental Status: She is alert and oriented to person, place, and time.  Psychiatric:        Behavior: Behavior normal.      Musculoskeletal Exam: C-spine slightly limited lateral rotation bilaterally.  Thoracic and lumbar spine good ROM.  Shoulder joints: forward flexion to 80 degrees bilaterally.  Abduction to about 80 degrees bilaterally.  Limited and painful internal rotation.  Elbow joints, wrist joints, MCPs, PIPs, and DIPs good ROM with no synovitis.  Hip joints, knee joints, ankle joints, MTPs, PIPs, DIPs good ROM with no synovitis.  No warmth  or effusion of knee joints.  No tenderness or swelling of ankle joints.    CDAI Exam: CDAI Score: 2.4  Patient Global: 2 mm; Provider Global: 2 mm Swollen: 0 ; Tender: 2  Joint Exam 01/23/2019      Right  Left  Glenohumeral   Tender   Tender     Investigation: No additional findings.  Imaging: No results found.  Recent Labs: Lab Results  Component Value Date   WBC 3.7 (L) 01/17/2019   HGB 11.4 (L) 01/17/2019   PLT 309 01/17/2019   NA 140 01/17/2019   K 4.1 01/17/2019   CL 103 01/17/2019   CO2 29 01/17/2019   GLUCOSE 112 (H) 01/17/2019   BUN 11 01/17/2019   CREATININE 0.77 01/17/2019   BILITOT 0.7 01/17/2019   ALKPHOS 58 09/18/2017   AST 14 01/17/2019   ALT 11 01/17/2019   PROT 7.7 01/17/2019   ALBUMIN 4.0 09/18/2017   CALCIUM 9.3 01/17/2019   GFRAA 100 01/17/2019   QFTBGOLDPLUS NEGATIVE 07/26/2018    Speciality Comments: No specialty comments available.  Procedures:  Large Joint Inj: bilateral glenohumeral on 01/23/2019 12:19 PM Indications: pain Details: 27 G 1.5 in needle, posterior approach  Arthrogram: No  Medications (Right): 1.5 mL  lidocaine 1 %; 40 mg triamcinolone acetonide 40 MG/ML Aspirate (Right): 0 mL Medications (Left): 1.5 mL lidocaine 1 %; 40 mg triamcinolone acetonide 40 MG/ML Aspirate (Left): 0 mL Outcome: tolerated well, no immediate complications Procedure, treatment alternatives, risks and benefits explained, specific risks discussed. Consent was given by the patient. Immediately prior to procedure a time out was called to verify the correct patient, procedure, equipment, support staff and site/side marked as required. Patient was prepped and draped in the usual sterile fashion.     Allergies: Aspirin, Demerol [meperidine], Hydrocodone-acetaminophen, Ibuprofen, Imdur [isosorbide nitrate], Meperidine hcl, Morphine, Oxycodone-acetaminophen, Procaine hcl, Toprol xl [metoprolol], and Tramadol   Assessment / Plan:     Visit Diagnoses: Mixed connective tissue disease (HCC) - +ANA,+Sm,+RNP,+RF, arthritis: She has not had any signs or symptoms of a flare recently. She is taking Xeljanz 11 mg XR daily, MTX 0.6 ml sq once weekly, and folic acid 2 mg po daily.  She is on long term prednisone 2.5 mg po daily.  She presents today with pain in both shoulder joints, and she had bilateral joints injected with cortisone.  A referral to PT was placed today.  She is not having any other joint pain or joint swelling at this time.  She has not had any other new or worsening recently.  She will continue on the current treatment regimen.  She will follow up in 3 months.    High risk medication use - Xeljanz XR 11 mg 1 tablet by mouth daily, methotrexate 0.6 ml every 7 days, and folic acid 1 mg 2 tablets daily.  Last TB gold negative on 07/26/2018.  CBC and CMP were drawn on 01/17/19.  WBC count borderline low-3.7.  She was advised to return to recheck CBC in 1 month.    Chronic pain of both shoulders: She presents today with pain in both shoulder joints.  X-rays of both shoulder joints were unremarkable on 11/10/18.  She has limited and  painful internal rotation and forward flexion bilaterally. She requested cortisone injections bilaterally. She tolerated the procedure well.  Procedure note completed above. A referral to physical therapy will be placed today.  If her symptoms persist or worsen we will order a MRI for further evaluation.  Fibromyalgia: Her fibromyalgia pain has been manageable recently.  She has trapezius muscle tension and tenderness bilaterally.  She has been experiencing nocturnal pain in both shoulder joints.  She has chronic fatigue related to insomnia.   Chronic fatigue: She has chronic fatigue related to insomnia.   Primary insomnia: She continues to have interrupted sleep at night.   Trochanteric bursitis of both hips: Resolved.  She has no tenderness on exam today.   History of vitamin D deficiency: She is taking a vitamin D supplement.   Other medical conditions are listed as follows  History of gastroesophageal reflux (GERD)  History of stomach ulcers  History of high cholesterol  History of hypertension  History of asthma  Orders: Orders Placed This Encounter  Procedures  . Large Joint Inj  . Ambulatory referral to Physical Therapy   No orders of the defined types were placed in this encounter.   Face-to-face time spent with patient was 25 minutes. Greater than 50% of time was spent in counseling and coordination of care.  Follow-Up Instructions: Return in about 3 months (around 04/23/2019) for Mixed connective tissue disease .    Pollyann Savoy, MD  Note - This record has been created using Animal nutritionist.  Chart creation errors have been sought, but may not always  have been located. Such creation errors do not reflect on  the standard of medical care.

## 2019-01-31 ENCOUNTER — Other Ambulatory Visit: Payer: Self-pay | Admitting: Family Medicine

## 2019-01-31 DIAGNOSIS — A6000 Herpesviral infection of urogenital system, unspecified: Secondary | ICD-10-CM

## 2019-01-31 MED ORDER — VALACYCLOVIR HCL 500 MG PO TABS: ORAL_TABLET | ORAL | 0 refills | Status: DC

## 2019-02-01 ENCOUNTER — Ambulatory Visit: Payer: BC Managed Care – PPO | Admitting: Physical Therapy

## 2019-02-02 ENCOUNTER — Other Ambulatory Visit: Payer: Self-pay | Admitting: Family Medicine

## 2019-02-02 DIAGNOSIS — I10 Essential (primary) hypertension: Secondary | ICD-10-CM

## 2019-02-16 ENCOUNTER — Other Ambulatory Visit: Payer: Self-pay | Admitting: Family Medicine

## 2019-02-16 DIAGNOSIS — R519 Headache, unspecified: Secondary | ICD-10-CM

## 2019-02-20 ENCOUNTER — Telehealth (INDEPENDENT_AMBULATORY_CARE_PROVIDER_SITE_OTHER): Payer: BC Managed Care – PPO | Admitting: Family Medicine

## 2019-02-20 ENCOUNTER — Ambulatory Visit: Payer: BC Managed Care – PPO | Attending: Internal Medicine

## 2019-02-20 ENCOUNTER — Other Ambulatory Visit: Payer: Self-pay

## 2019-02-20 ENCOUNTER — Encounter: Payer: Self-pay | Admitting: Family Medicine

## 2019-02-20 VITALS — BP 123/86 | Ht 65.0 in

## 2019-02-20 DIAGNOSIS — R208 Other disturbances of skin sensation: Secondary | ICD-10-CM | POA: Diagnosis not present

## 2019-02-20 DIAGNOSIS — R5383 Other fatigue: Secondary | ICD-10-CM

## 2019-02-20 DIAGNOSIS — Z20822 Contact with and (suspected) exposure to covid-19: Secondary | ICD-10-CM

## 2019-02-20 DIAGNOSIS — I10 Essential (primary) hypertension: Secondary | ICD-10-CM | POA: Diagnosis not present

## 2019-02-20 DIAGNOSIS — M797 Fibromyalgia: Secondary | ICD-10-CM

## 2019-02-20 NOTE — Progress Notes (Signed)
Virtual Visit via Video Note   I connected with Patricia Reed on 02/20/19 by a video enabled telemedicine application and verified that I am speaking with the correct person using two identifiers.  Location patient: home Location provider:work office Persons participating in the virtual visit: patient, provider  I discussed the limitations of evaluation and management by telemedicine and the availability of in person appointments. The patient expressed understanding and agreed to proceed.   HPI: Patricia Reed is a 59 yo female with Hx of HTN, allergic rhinitis,chronic fatigue,and mixed connective tissue disease c/o 10 days of feeling "tired" and "SOB" with exertion,like she wants to sleep,"very tired." Problem is alleviated by resting. She is concerned about possible "heart attack." Negative for associated CP, orthopnea, PND, palpitations, or diaphoresis.  Problem seems to be stable.  Negative for fever,chills,sore throat, cough,wheezing, abdominal pain, N/V, urinary symptoms, and skin rash. Burning-like sensation on upper and lower extremities x 3-4 days, she has had this in the past. Negative for focal neurologic deficit or Patricia changes. At this time she is not having arthralgias. + Myalgias and bruising with no hx of trauma.  Denies gum/nose bleeding, melena, or blood in the stool.  She would like to have blood work done.  Lab Results  Component Value Date   TSH 0.59 05/26/2018    She follows with rheumatologist for fibromyalgia and RA, she had labs done early January/2021.  Hypertension: She has not been checking BP regularly. Negative for unusual headache, visual changes, decreased urinary output, gross hematuria, or edema. Currently she is on amlodipine 10 mg daily and benazepril 10 mg daily.  Lab Results  Component Value Date   CREATININE 0.77 01/17/2019   BUN 11 01/17/2019   NA 140 01/17/2019   K 4.1 01/17/2019   CL 103 01/17/2019   CO2 29 01/17/2019   She also needs a  letter for employer stating that she does not have COVID-19 infection. She denies known sick contacts or changes in the smell and taste.  ROS: See pertinent positives and negatives per HPI.  Past Medical History:  Diagnosis Date  . Allergy   . Anemia   . Asthma   . Blood transfusion without reported diagnosis   . Fibromyalgia   . GERD (gastroesophageal reflux disease)   . Heart murmur   . High cholesterol   . History of stomach ulcers   . Hypertension   . Lupus (HCC)   . MI, old   . Rheumatoid arthritis (HCC)   . Thyroid disease   . Thyroid mass   . Vertigo     Past Surgical History:  Procedure Laterality Date  . ABDOMINAL HYSTERECTOMY     done for menorrhagia, ovaries remain  . CERVICAL DISCECTOMY     plates and screws in neck  . CESAREAN SECTION    . COLONOSCOPY    . ESOPHAGEAL MANOMETRY N/A 02/12/2014   Procedure: ESOPHAGEAL MANOMETRY (EM);  Surgeon: Louis Meckel, MD;  Location: [facility];  Service: Endoscopy;  Laterality: N/A;  . ESOPHAGOGASTRODUODENOSCOPY  03/04/2011   Procedure: ESOPHAGOGASTRODUODENOSCOPY (EGD);  Surgeon: Louis Meckel, MD;  Location: [facility];  Service: Endoscopy;  Laterality: N/A;  BOTOX Injection  . exc benign breast lump    . TONSILLECTOMY    . UPPER GASTROINTESTINAL ENDOSCOPY      Family History  Problem Relation Age of Onset  . Alcohol abuse Father   . Hypertension Father   . Heart disease Father   . Colon cancer Maternal Uncle   .  Diabetes Maternal Grandmother   . Colon polyps Neg Hx   . Esophageal cancer Neg Hx   . Kidney disease Neg Hx   . Stomach cancer Neg Hx   . Rectal cancer Neg Hx     Social History   Socioeconomic History  . Marital status: Married    Spouse name: Not on file  . Number of children: 4  . Years of education: Not on file  . Highest education level: Not on file  Occupational History  . Occupation: Lobbyist: [employer]  Tobacco Use  . Smoking status: Never  Smoker  . Smokeless tobacco: Never Used  Substance and Sexual Activity  . Alcohol use: Yes    Alcohol/week: 1.0 standard drinks    Types: 1 Glasses of wine per week    Comment: occ  . Drug use: No  . Sexual activity: Yes    Birth control/protection: None, Surgical    Comment: LAVH  Other Topics Concern  . 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     Social Determinants of Health   Financial Resource Strain:   . Difficulty of Paying Living Expenses: Not on file  Food Insecurity:   . Worried About Programme researcher, broadcasting/film/video in the Last Year: Not on file  . Ran Out of Food in the Last Year: Not on file  Transportation Needs:   . Lack of Transportation (Medical): Not on file  . Lack of Transportation (Non-Medical): Not on file  Physical Activity:   . Days of Exercise per Week: Not on file  . Minutes of Exercise per Session: Not on file  Stress:   . Feeling of Stress : Not on file  Social Connections:   . Frequency of Communication with Friends and Family: Not on file  . Frequency of Social Gatherings with Friends and Family: Not on file  . Attends Religious Services: Not on file  . Active Member of Clubs or Organizations: Not on file  . Attends Banker Meetings: Not on file  . Marital Status: Not on file  Intimate Partner Violence:   . Fear of Current or Ex-Partner: Not on file  . Emotionally Abused: Not on file  . Physically Abused: Not on file  . Sexually Abused: Not on file    Current Outpatient Medications:  .  acetaminophen (TYLENOL) 650 MG CR tablet, Take 650 mg by mouth daily. , Disp: , Rfl:  .  albuterol (VENTOLIN HFA) 108 (90 Base) MCG/ACT inhaler, Inhale 2 puffs into the lungs every 6 (six) hours as needed for wheezing or shortness of breath., Disp: 6.7 g, Rfl: 1 .  amLODipine (NORVASC) 10 MG tablet, Take 1 tablet (10 mg total) by mouth daily., Disp: 90 tablet, Rfl: 3 .  aspirin 81  MG tablet, Take 81 mg by mouth daily., Disp: , Rfl:  .  B-D INS SYR ULTRAFINE 1CC/30G 30G X 1/2" 1 ML MISC, , Disp: , Rfl:  .  baclofen (LIORESAL) 10 MG tablet, TAKE 1 TABLET BY MOUTH AT BEDTIME AS NEEDED FOR MUSCLE SPASMS, Disp: 30 tablet, Rfl: 0 .  benazepril (LOTENSIN) 10 MG tablet, Take 1 tablet (10 mg total) by mouth daily., Disp: 90 tablet, Rfl: 2 .  diclofenac sodium (VOLTAREN) 1 % GEL, Apply 3 g to 3 large joints up to 3 times daily., Disp: 3 Tube, Rfl: 3 .  folic acid (FOLVITE)  1 MG tablet, TAKE 2 TABLETS (2 MG TOTAL) BY MOUTH DAILY., Disp: 180 tablet, Rfl: 3 .  methotrexate 50 MG/2ML injection, Inject 0.6 mLs (15 mg total) into the skin once a week., Disp: 8 mL, Rfl: 0 .  pantoprazole (PROTONIX) 40 MG tablet, TAKE 1 TABLET BY MOUTH EVERY DAY, Disp: 90 tablet, Rfl: 1 .  predniSONE (DELTASONE) 5 MG tablet, Take 2.5 tablets by mouth daily., Disp: , Rfl:  .  Tuberculin-Allergy Syringes (ALLERGY SYRINGE 1CC/27GX1/2") 27G X 1/2" 1 ML MISC, Patient to use to inject MTX weekly, Disp: 12 each, Rfl: 3 .  valACYclovir (VALTREX) 500 MG tablet, TAKE 1 TABLET (500 MG TOTAL) BY MOUTH 2 (TWO) TIMES DAILY. FOR 3 DAYS WITH ACUTE EPISODES., Disp: 18 tablet, Rfl: 0 .  vitamin B-12 (CYANOCOBALAMIN) 100 MCG tablet, Take 100 mcg by mouth daily., Disp: , Rfl:  .  VITAMIN D, ERGOCALCIFEROL, PO, Take 1 capsule by mouth daily., Disp: , Rfl:  .  XELJANZ XR 11 MG TB24, Take 1 tablet by mouth daily., Disp: , Rfl:   EXAM:  VITALS per patient if applicable:BP 921/19   Ht 5\' 5"  (1.651 m)   BMI 36.74 kg/m   GENERAL: alert, oriented, appears well and in no acute distress  HEENT: atraumatic, conjunctiva clear, no obvious abnormalities on inspection.  NECK: normal movements of the head and neck  LUNGS: on inspection no signs of respiratory distress, breathing rate appears normal, no obvious gross SOB, gasping or wheezing  CV: no obvious cyanosis  Patricia: moves all visible extremities without noticeable  abnormality  PSYCH/NEURO: pleasant and cooperative, no obvious depression or anxiety, speech and thought processing grossly intact  ASSESSMENT AND PLAN:  Discussed the following assessment and plan:  Fatigue, unspecified type - Plan: CBC, Basic metabolic panel She does not seem to be having SOB, it is more tiredness. We discussed possible etiologies, including some of her chronic medical problems. I do not think fatigue is caused for an acute infectious process but given the fact her employer is requesting a letter stating that she does not have COVID-19 infection, recommend arranging appointment for testing this afternoon. Monitor for new symptoms. Quarantine recommended until negative testing. Future lab appointment arranged.  Burning sensation of skin - Plan: Vitamin B12 It seems to be a recurrent problem. We will check B12 with next blood work. Explained that can also be part of fibromyalgia.  Benign essential hypertension - Plan: Basic metabolic panel Today BP adequately controlled. Recommend checking BP regularly. No changes in current management. Continue low-salt diet.  Clearly instructed about warning signs.  Fibromyalgia Most of the symptoms she reported could be explained by fibromyalgia. Continue following with rheumatologist.    I discussed the assessment and treatment plan with the patient. Patricia Reed was provided an opportunity to ask questions and all were answered. She agreed with the plan and demonstrated an understanding of the instructions.    No follow-ups on file.    Gleason Ardoin [name], MD

## 2019-02-21 ENCOUNTER — Encounter: Payer: Self-pay | Admitting: Family Medicine

## 2019-02-21 LAB — NOVEL CORONAVIRUS, NAA: SARS-CoV-2, NAA: NOT DETECTED

## 2019-02-23 ENCOUNTER — Other Ambulatory Visit: Payer: Self-pay

## 2019-02-24 ENCOUNTER — Telehealth (INDEPENDENT_AMBULATORY_CARE_PROVIDER_SITE_OTHER): Payer: BC Managed Care – PPO | Admitting: Adult Health

## 2019-02-24 ENCOUNTER — Other Ambulatory Visit (INDEPENDENT_AMBULATORY_CARE_PROVIDER_SITE_OTHER): Payer: BC Managed Care – PPO

## 2019-02-24 DIAGNOSIS — R5383 Other fatigue: Secondary | ICD-10-CM | POA: Diagnosis not present

## 2019-02-24 DIAGNOSIS — R208 Other disturbances of skin sensation: Secondary | ICD-10-CM | POA: Diagnosis not present

## 2019-02-24 DIAGNOSIS — I10 Essential (primary) hypertension: Secondary | ICD-10-CM

## 2019-02-24 LAB — BASIC METABOLIC PANEL
BUN: 11 mg/dL (ref 6–23)
CO2: 27 mEq/L (ref 19–32)
Calcium: 9 mg/dL (ref 8.4–10.5)
Chloride: 104 mEq/L (ref 96–112)
Creatinine, Ser: 0.8 mg/dL (ref 0.40–1.20)
GFR: 89.52 mL/min (ref >60.00)
Glucose, Bld: 92 mg/dL (ref 70–99)
Potassium: 3.3 mEq/L — ABNORMAL LOW (ref 3.5–5.1)
Sodium: 142 mEq/L (ref 135–145)

## 2019-02-24 LAB — CBC
HCT: 39.3 % (ref 36.0–46.0)
Hemoglobin: 12.4 g/dL (ref 12.0–15.0)
MCHC: 31.6 g/dL (ref 30.0–36.0)
MCV: 91.2 fl (ref 78.0–100.0)
Platelets: 330 10*3/uL (ref 150.0–400.0)
RBC: 4.31 Mil/uL (ref 3.87–5.11)
RDW: 15.8 % — ABNORMAL HIGH (ref 11.5–15.5)
WBC: 5.7 10*3/uL (ref 4.0–10.5)

## 2019-02-24 LAB — VITAMIN B12: Vitamin B-12: 523 pg/mL (ref 211–911)

## 2019-02-24 NOTE — Progress Notes (Signed)
Virtual Visit via Video Note  I connected with Patricia Reed on 02/24/19 at  3:30 PM EST by a video enabled telemedicine application and verified that I am speaking with the correct person using two identifiers.  Location patient: home Location provider:work or home office Persons participating in the virtual visit: patient, provider  I discussed the limitations of evaluation and management by telemedicine and the availability of in person appointments. The patient expressed understanding and agreed to proceed.   HPI: She was seen by her PCP 4 days ago on 02/20/2019 for multiple issues including fatigue.  At this time Covid test was done which came back negative and she had blood work including a CBC BMP and B12 done today.  She reports 7 to 10 days of feeling "tired" and "just feeling like I am floating around and drunk".  She denies shortness of breath or chest pain.  Has a history of lupus and fibromyalgia.  Has not experienced palpitations, diaphoresis, fevers, chills, or cough.    She did check her blood pressures prior to this visit and had results of 112/73 and 118/77   ROS: See pertinent positives and negatives per HPI.  Past Medical History:  Diagnosis Date  . Allergy   . Anemia   . Asthma   . Blood transfusion without reported diagnosis   . Fibromyalgia   . GERD (gastroesophageal reflux disease)   . Heart murmur   . High cholesterol   . History of stomach ulcers   . Hypertension   . Lupus (HCC)   . MI, old   . Rheumatoid arthritis (HCC)   . Thyroid disease   . Thyroid mass   . Vertigo     Past Surgical History:  Procedure Laterality Date  . ABDOMINAL HYSTERECTOMY     done for menorrhagia, ovaries remain  . CERVICAL DISCECTOMY     plates and screws in neck  . CESAREAN SECTION    . COLONOSCOPY    . ESOPHAGEAL MANOMETRY N/A 02/12/2014   Procedure: ESOPHAGEAL MANOMETRY (EM);  Surgeon: Louis Meckel, MD;  Location: [facility];  Service: Endoscopy;   Laterality: N/A;  . ESOPHAGOGASTRODUODENOSCOPY  03/04/2011   Procedure: ESOPHAGOGASTRODUODENOSCOPY (EGD);  Surgeon: Louis Meckel, MD;  Location: [facility];  Service: Endoscopy;  Laterality: N/A;  BOTOX Injection  . exc benign breast lump    . TONSILLECTOMY    . UPPER GASTROINTESTINAL ENDOSCOPY      Family History  Problem Relation Age of Onset  . Alcohol abuse Father   . Hypertension Father   . Heart disease Father   . Colon cancer Maternal Uncle   . Diabetes Maternal Grandmother   . Colon polyps Neg Hx   . Esophageal cancer Neg Hx   . Kidney disease Neg Hx   . Stomach cancer Neg Hx   . Rectal cancer Neg Hx        Current Outpatient Medications:  .  acetaminophen (TYLENOL) 650 MG CR tablet, Take 650 mg by mouth daily. , Disp: , Rfl:  .  albuterol (VENTOLIN HFA) 108 (90 Base) MCG/ACT inhaler, Inhale 2 puffs into the lungs every 6 (six) hours as needed for wheezing or shortness of breath., Disp: 6.7 g, Rfl: 1 .  amLODipine (NORVASC) 10 MG tablet, Take 1 tablet (10 mg total) by mouth daily., Disp: 90 tablet, Rfl: 3 .  aspirin 81 MG tablet, Take 81 mg by mouth daily., Disp: , Rfl:  .  B-D INS SYR ULTRAFINE 1CC/30G 30G X 1/2"  1 ML MISC, , Disp: , Rfl:  .  baclofen (LIORESAL) 10 MG tablet, TAKE 1 TABLET BY MOUTH AT BEDTIME AS NEEDED FOR MUSCLE SPASMS, Disp: 30 tablet, Rfl: 0 .  benazepril (LOTENSIN) 10 MG tablet, Take 1 tablet (10 mg total) by mouth daily., Disp: 90 tablet, Rfl: 2 .  diclofenac sodium (VOLTAREN) 1 % GEL, Apply 3 g to 3 large joints up to 3 times daily., Disp: 3 Tube, Rfl: 3 .  folic acid (FOLVITE) 1 MG tablet, TAKE 2 TABLETS (2 MG TOTAL) BY MOUTH DAILY., Disp: 180 tablet, Rfl: 3 .  methotrexate 50 MG/2ML injection, Inject 0.6 mLs (15 mg total) into the skin once a week., Disp: 8 mL, Rfl: 0 .  pantoprazole (PROTONIX) 40 MG tablet, TAKE 1 TABLET BY MOUTH EVERY DAY, Disp: 90 tablet, Rfl: 1 .  predniSONE (DELTASONE) 5 MG tablet, Take 2.5 tablets by mouth daily., Disp:  , Rfl:  .  Tuberculin-Allergy Syringes (ALLERGY SYRINGE 1CC/27GX1/2") 27G X 1/2" 1 ML MISC, Patient to use to inject MTX weekly, Disp: 12 each, Rfl: 3 .  valACYclovir (VALTREX) 500 MG tablet, TAKE 1 TABLET (500 MG TOTAL) BY MOUTH 2 (TWO) TIMES DAILY. FOR 3 DAYS WITH ACUTE EPISODES., Disp: 18 tablet, Rfl: 0 .  vitamin B-12 (CYANOCOBALAMIN) 100 MCG tablet, Take 100 mcg by mouth daily., Disp: , Rfl:  .  VITAMIN D, ERGOCALCIFEROL, PO, Take 1 capsule by mouth daily., Disp: , Rfl:  .  XELJANZ XR 11 MG TB24, Take 1 tablet by mouth daily., Disp: , Rfl:   EXAM:  VITALS per patient if applicable:  GENERAL: alert, oriented, appears well and in no acute distress  HEENT: atraumatic, conjunttiva clear, no obvious abnormalities on inspection of external nose and ears  NECK: normal movements of the head and neck  LUNGS: on inspection no signs of respiratory distress, breathing rate appears normal, no obvious gross SOB, gasping or wheezing  CV: no obvious cyanosis  MS: moves all visible extremities without noticeable abnormality  PSYCH/NEURO: pleasant and cooperative, no obvious depression or anxiety, speech and thought processing grossly intact  ASSESSMENT AND PLAN:  Discussed the following assessment and plan:  1. Fatigue, unspecified type -Teague does not appear to be the result of an infectious process.  Possibly related to fibromyalgia.  Her lab work had not resulted during this medicine visit and she was advised to wait until blood work returns.  In the meantime make sure she is staying hydrated and rest as much as possible.     I discussed the assessment and treatment plan with the patient. The patient was provided an opportunity to ask questions and all were answered. The patient agreed with the plan and demonstrated an understanding of the instructions.   The patient was advised to call back or seek an in-person evaluation if the symptoms worsen or if the condition fails to improve as  anticipated.   Dorothyann Peng, NP

## 2019-02-27 ENCOUNTER — Telehealth: Payer: Self-pay | Admitting: Family Medicine

## 2019-02-27 ENCOUNTER — Encounter: Payer: Self-pay | Admitting: Family Medicine

## 2019-02-27 NOTE — Telephone Encounter (Signed)
Blood work was otherwise normal , so it does not seem to be an infection process. I understand COVID 19 test was negative. Symptoms seems to be related to fibromyalgia. I will be glad to complete an FMLA for the days she already missed. In my opinion she can go back to work.  Thanks, BJ

## 2019-02-27 NOTE — Telephone Encounter (Signed)
Patient had a virtual appointment with Shirline Frees on Friday 02/24/2019 and she is just not feeling good and her job needs a note for her to stay out of work and she was also wondering what she needs to do. She's just wanting to know what's going on with her. She was also wondering if she needs to get FMLA forms filled out because her job wants her to come to work but don't want her to come to work with the symptoms that she is having.    Please advise [phone]

## 2019-02-27 NOTE — Progress Notes (Signed)
02/27/2019 Patricia Reed [id number] [birth year]   Chief Complaint:  History of Present Illness: Patricia Reed is a 59 year old female with a past medical history of asthma, HTN, CAD, MI, Lupus 2015, rheumatoid arthritis, fibromyalgia, GERD and UGI secondary to a gastric ulcer in 1996.  She complains of having stomach irritation for the past 2 weeks.  She describes her irritation as a headache in her stomach.  A "void" feeling in her stomach.  No dysphagia or heartburn.  She developed nausea with feeling lightheaded 1 and 1/2 weeks ago while at work.  She is a third grade school teacher.  She went home and rested. No CP of SOB.  She underwent a Covid test on Monday 2/1 which was negative.  Nausea continues and is intermittent.  No vomiting.  She is eating small quantities of food.  Nausea worsens after eating.  Her stomach pain does not improve or worsen after eating. However, she describes her stomach does not feel satisfied after eating any food.  Decreased appetite.  She describes passing hard black stools for the past month.  No loose tarry melenic type stools.  She does not take iron or Pepto-Bismol.  She stated her symptoms of nausea, upper abdominal pain and constipation began within a few weeks after starting Amlodipine and Lisinopril hypertension as prescribed by her PCP Dr. [name].  Has a history of rheumatoid arthritis followed by [facility] Midge [facility].  She is on Xeljanz for the past 2 to 3 years, methotrexate for the past 5 years and Prednisone 2.5 mg daily for the past year.  She is also taking ASA 81 mg once daily with a history of CAD and past MI.  She denies taking any other NSAIDs.  History of a bleeding gastric ulcer in 1996, she has been on PPI since that time.  She is currently maintained on Protonix 40 mg once daily.  She was most recently evaluated in our office by Dr. Loletha Carrow on 03/08/2018 further evaluation regarding epigastric pain constipation.  She also  noted having black stools at that time as well.  Her Aspirin was briefly stopped.  She underwent an EGD 03/21/2018 which was normal.  CBC Latest Ref Rng & Units 02/24/2019 01/17/2019 10/12/2018  WBC 4.0 - 10.5 K/uL 5.7 3.7(L) 4.5  Hemoglobin 12.0 - 15.0 g/dL 12.4 11.4(L) 11.6(L)  Hematocrit 36.0 - 46.0 % 39.3 35.3 35.7  Platelets 150.0 - 400.0 K/uL 330.0 309 322   CMP Latest Ref Rng & Units 02/24/2019 01/17/2019 12/02/2018  Glucose 70 - 99 mg/dL 92 112(H) 87  BUN 6 - 23 mg/dL 11 11 16   Creatinine 0.40 - 1.20 mg/dL 0.80 0.77 0.68  Sodium 135 - 145 mEq/L 142 140 139  Potassium 3.5 - 5.1 mEq/L 3.3(L) 4.1 3.7  Chloride 96 - 112 mEq/L 104 103 102  CO2 19 - 32 mEq/L 27 29 29   Calcium 8.4 - 10.5 mg/dL 9.0 9.3 9.5  Total Protein 6.1 - 8.1 g/dL - 7.7 -  Total Bilirubin 0.2 - 1.2 mg/dL - 0.7 -  Alkaline Phos 38 - 126 U/L - - -  AST 10 - 35 U/L - 14 -  ALT 6 - 29 U/L - 11 -   EGD 03/21/2018 by Dr. Loletha Carrow: - Normal esophagus. - Normal stomach. - Normal examined duodenum. - No specimens collected.  EGD 03/27/3014 by Dr. Deatra Ina: Normal.   In addition, she had undergone EGD with LES Botox injection by Dr. Deatra Ina  in 2013. There are numerous EGD reports to that dating back to 1996, when the patient had a large gastric ulcer  Esophageal manometry 02/12/2014: Normal.   Colonoscopy 02/16/2014: 1. Mild diverticulosis was noted 2. Internal hemorrhoids 3. The examination was otherwise normal Repeat colonoscopy in 10 years  ECHO 04/22/2017: LV EF: 60% -  65%   Current Outpatient Medications on File Prior to Visit  Medication Sig Dispense Refill  . acetaminophen (TYLENOL) 650 MG CR tablet Take 650 mg by mouth daily.     Marland Kitchen albuterol (VENTOLIN HFA) 108 (90 Base) MCG/ACT inhaler Inhale 2 puffs into the lungs every 6 (six) hours as needed for wheezing or shortness of breath. 6.7 g 1  . amLODipine (NORVASC) 10 MG tablet Take 1 tablet (10 mg total) by mouth daily. 90 tablet 3  . aspirin 81 MG tablet Take  81 mg by mouth daily.    . B-D INS SYR ULTRAFINE 1CC/30G 30G X 1/2" 1 ML MISC     . baclofen (LIORESAL) 10 MG tablet TAKE 1 TABLET BY MOUTH AT BEDTIME AS NEEDED FOR MUSCLE SPASMS 30 tablet 0  . benazepril (LOTENSIN) 10 MG tablet Take 1 tablet (10 mg total) by mouth daily. 90 tablet 2  . diclofenac sodium (VOLTAREN) 1 % GEL Apply 3 g to 3 large joints up to 3 times daily. 3 Tube 3  . folic acid (FOLVITE) 1 MG tablet TAKE 2 TABLETS (2 MG TOTAL) BY MOUTH DAILY. 180 tablet 3  . methotrexate 50 MG/2ML injection Inject 0.6 mLs (15 mg total) into the skin once a week. 8 mL 0  . pantoprazole (PROTONIX) 40 MG tablet TAKE 1 TABLET BY MOUTH EVERY DAY 90 tablet 1  . predniSONE (DELTASONE) 5 MG tablet Take 2.5 tablets by mouth daily.    . Tuberculin-Allergy Syringes (ALLERGY SYRINGE 1CC/27GX1/2") 27G X 1/2" 1 ML MISC Patient to use to inject MTX weekly 12 each 3  . valACYclovir (VALTREX) 500 MG tablet TAKE 1 TABLET (500 MG TOTAL) BY MOUTH 2 (TWO) TIMES DAILY. FOR 3 DAYS WITH ACUTE EPISODES. 18 tablet 0  . vitamin B-12 (CYANOCOBALAMIN) 100 MCG tablet Take 100 mcg by mouth daily.    Marland Kitchen VITAMIN D, ERGOCALCIFEROL, PO Take 1 capsule by mouth daily.    [name] XR 11 MG TB24 Take 1 tablet by mouth daily.     No current facility-administered medications on file prior to visit.   Allergies  Allergen Reactions  . Aspirin Other (See Comments)    GI bleed  . Demerol [Meperidine]   . Hydrocodone-Acetaminophen Hives and Nausea And Vomiting  . Ibuprofen Other (See Comments)    GI bleed  . Imdur [Isosorbide Nitrate] Other (See Comments)    Reaction unknown  . Meperidine Hcl Hives and Nausea And Vomiting    Short term memory loss  . Morphine Hives and Nausea And Vomiting  . Oxycodone-Acetaminophen Hives and Nausea And Vomiting    Reaction unknown  . Procaine Hcl Other (See Comments)    Ineffective  . Toprol Xl [Metoprolol] Other (See Comments)    Reaction unknown  . Tramadol Itching    Current Medications,  Allergies, Past Medical History, Past Surgical History, Family History and Social History were reviewed in [employer] record.   Physical Exam: BP 118/78   Pulse 84   Temp 97.6 F (36.4 C)   Ht 5\' 5"  (1.651 m)   Wt 215 lb (97.5 kg)   BMI 35.78 kg/m   General: Well  develoed  59 year old female in no acute distress. Head: Normocephalic and atraumatic. Eyes:  No scleral icterus. Conjunctiva pink . Ears: Normal auditory acuity. Lungs: Clear throughout to auscultation. Heart: Regular rate and rhythm, no murmur. Abdomen: Soft, nondistended. Mild tenderness LUQ, epigastric area without rebound or guarding. No masses or hepatomegaly. Normal bowel sounds x 4 quadrants.  Rectal: Small anterior anal tag. Brown sold stool in the rectal vault guaiac negative. No mass.  Musculoskeletal: Symmetrical with no gross deformities. Extremities: No edema. Neurological: Alert oriented x 4. No focal deficits.  Psychological:  Alert and cooperative. Normal mood and affect  Assessment and Recommendations:  53. 59 year old female with a history of an UGI secondary to a large gastric ulcer in 1996 and chronic epigastric pain presents with nausea,  increased epigastric pain and solid black stools for 1 1/2 weeks which began within 2 to 4 weeks after starting Benzapril and Amlodipine for HTN. She is on ASA 81mg  daily and Prednisone 2.5mg  daily which increases her risk of PUD maintained on Protonix 40mg  QD. Rectal exam showed brown solid stool guaiac negative. Hg 12.4 on 2/5 (up from Hg 11.4 on 01/17/2019).  -Increase Protonix 40mg  1 po bid for 2 weeks -If patient has recurrence of black stools will repeat CBC and she was given heme slides to complete when she sees a black stool at home -Abdominal sonogram to assess for gallstones in setting of nausea and epigastric pain  -To further discuss scheduling a repeat EGD vs EGD with small bowel enteroscopy if her upper abdominal pain worsens or if  she has recurrence of black stools. I will consult with Dr. as well to determine if endoscopic evaluation warranted at this time. -Follow up in 2 to 3 weeks, patient to call our office if her symptoms worsen. If sx persist will repeat CBC and check CMP, Lipase level.   2. Consitpation -Miralax 1 capful mixed in 8 ounces of water at bed time as needed  3. HTN -Patient to follow up with Dr. 01/19/2019 regarding HTN follow up, patient reported nausea and epigastric pain began after starting Benzapril and Amlodipine.   4. Colon cancer screening, up to date -Next colonoscopy due 01/2024, earlier if medically indicated  5. Rheumatoid arthritis on Xeljanz, Prednisone and Methotrexate  6.  History of CAD, past MI on ASA 81 mg daily

## 2019-02-27 NOTE — Telephone Encounter (Signed)
See mychart messages. Note provided to cover pt through tomorrow. Pt sees GI tomorrow morning. Will let us know if she needs fmla filled out.

## 2019-02-28 ENCOUNTER — Encounter: Payer: Self-pay | Admitting: Nurse Practitioner

## 2019-02-28 ENCOUNTER — Ambulatory Visit (INDEPENDENT_AMBULATORY_CARE_PROVIDER_SITE_OTHER): Payer: BC Managed Care – PPO | Admitting: Nurse Practitioner

## 2019-02-28 ENCOUNTER — Ambulatory Visit (HOSPITAL_BASED_OUTPATIENT_CLINIC_OR_DEPARTMENT_OTHER)
Admission: RE | Admit: 2019-02-28 | Discharge: 2019-02-28 | Disposition: A | Discharge status: Home / Self Care | Payer: BC Managed Care – PPO | Source: Ambulatory Visit | Attending: Nurse Practitioner | Admitting: Nurse Practitioner

## 2019-02-28 ENCOUNTER — Other Ambulatory Visit: Payer: Self-pay

## 2019-02-28 VITALS — BP 118/78 | HR 84 | Temp 97.6°F | Ht 65.0 in | Wt 215.0 lb

## 2019-02-28 DIAGNOSIS — K5900 Constipation, unspecified: Secondary | ICD-10-CM

## 2019-02-28 DIAGNOSIS — R1013 Epigastric pain: Secondary | ICD-10-CM | POA: Insufficient documentation

## 2019-02-28 DIAGNOSIS — K2940 Chronic atrophic gastritis without bleeding: Secondary | ICD-10-CM | POA: Diagnosis not present

## 2019-02-28 DIAGNOSIS — R110 Nausea: Secondary | ICD-10-CM

## 2019-02-28 MED ORDER — PANTOPRAZOLE SODIUM 40 MG PO TBEC: 40.0000 mg | DELAYED_RELEASE_TABLET | Freq: Two times a day (BID) | ORAL | 0 refills | Status: DC

## 2019-02-28 NOTE — Patient Instructions (Addendum)
If you are age 57 or older, your body mass index should be between 23-30. Your Body mass index is 35.78 kg/m. If this is out of the aforementioned range listed, please consider follow up with your Primary Care Provider.  If you are age 53 or younger, your body mass index should be between 19-25. Your Body mass index is 35.78 kg/m. If this is out of the aformentioned range listed, please consider follow up with your Primary Care Provider.   1. Take Miralax daily- 1 capful to 8 ounces of water at bedtime 2. Increase your pantoprazole to twice daily for 2 weeks. 3. Contact your Primary Physician to discuss your nausea and abdominal symptoms since the start of your 2 new blood pressure medications. 4. You have been given a Hemoccult card to use if you should see any more black stools. Please mail to the address on the envelope.  You have been scheduled for an abdominal ultrasound at [facility] center Radiology (1st floor of hospital) on 03/01/2019 at 11:00 am. Please arrive 15 minutes prior to your appointment for registration. Make certain not to have anything to eat or drink 6 hours prior to your appointment. Should you need to reschedule your appointment, please contact radiology at [phone]. This test typically takes about 30 minutes to perform.    Due to recent changes in healthcare laws, you may see the results of your imaging and laboratory studies on MyChart before your provider has had a chance to review them.  We understand that in some cases there may be results that are confusing or concerning to you. Not all laboratory results come back in the same time frame and the provider may be waiting for multiple results in order to interpret others.  Please give [place] 48 hours in order for your provider to thoroughly review all the results before contacting the office for clarification of your results.

## 2019-03-01 ENCOUNTER — Other Ambulatory Visit (HOSPITAL_BASED_OUTPATIENT_CLINIC_OR_DEPARTMENT_OTHER): Payer: BC Managed Care – PPO

## 2019-03-11 ENCOUNTER — Encounter (HOSPITAL_COMMUNITY): Payer: Self-pay | Admitting: Emergency Medicine

## 2019-03-11 ENCOUNTER — Emergency Department (HOSPITAL_COMMUNITY): Payer: BC Managed Care – PPO

## 2019-03-11 ENCOUNTER — Other Ambulatory Visit: Payer: Self-pay

## 2019-03-11 ENCOUNTER — Inpatient Hospital Stay (HOSPITAL_COMMUNITY)
Admission: EM | Admit: 2019-03-11 | Discharge: 2019-03-14 | DRG: 392 | Disposition: A | Discharge status: Home / Self Care | Payer: BC Managed Care – PPO | Attending: Internal Medicine | Admitting: Internal Medicine

## 2019-03-11 DIAGNOSIS — Y92009 Unspecified place in unspecified non-institutional (private) residence as the place of occurrence of the external cause: Secondary | ICD-10-CM

## 2019-03-11 DIAGNOSIS — E785 Hyperlipidemia, unspecified: Secondary | ICD-10-CM | POA: Diagnosis present

## 2019-03-11 DIAGNOSIS — M359 Systemic involvement of connective tissue, unspecified: Secondary | ICD-10-CM | POA: Diagnosis present

## 2019-03-11 DIAGNOSIS — R1115 Cyclical vomiting syndrome unrelated to migraine: Secondary | ICD-10-CM

## 2019-03-11 DIAGNOSIS — R109 Unspecified abdominal pain: Secondary | ICD-10-CM

## 2019-03-11 DIAGNOSIS — Z20822 Contact with and (suspected) exposure to covid-19: Secondary | ICD-10-CM | POA: Diagnosis present

## 2019-03-11 DIAGNOSIS — Z91128 Patient's intentional underdosing of medication regimen for other reason: Secondary | ICD-10-CM

## 2019-03-11 DIAGNOSIS — Z9114 Patient's other noncompliance with medication regimen: Secondary | ICD-10-CM

## 2019-03-11 DIAGNOSIS — Z800 Family history of malignant neoplasm of digestive organs: Secondary | ICD-10-CM

## 2019-03-11 DIAGNOSIS — Z8719 Personal history of other diseases of the digestive system: Secondary | ICD-10-CM

## 2019-03-11 DIAGNOSIS — Z884 Allergy status to anesthetic agent status: Secondary | ICD-10-CM

## 2019-03-11 DIAGNOSIS — E860 Dehydration: Secondary | ICD-10-CM | POA: Diagnosis present

## 2019-03-11 DIAGNOSIS — K219 Gastro-esophageal reflux disease without esophagitis: Principal | ICD-10-CM | POA: Diagnosis present

## 2019-03-11 DIAGNOSIS — Z886 Allergy status to analgesic agent status: Secondary | ICD-10-CM

## 2019-03-11 DIAGNOSIS — R112 Nausea with vomiting, unspecified: Secondary | ICD-10-CM | POA: Diagnosis present

## 2019-03-11 DIAGNOSIS — Z885 Allergy status to narcotic agent status: Secondary | ICD-10-CM

## 2019-03-11 DIAGNOSIS — J45909 Unspecified asthma, uncomplicated: Secondary | ICD-10-CM

## 2019-03-11 DIAGNOSIS — K2970 Gastritis, unspecified, without bleeding: Secondary | ICD-10-CM | POA: Insufficient documentation

## 2019-03-11 DIAGNOSIS — K2900 Acute gastritis without bleeding: Secondary | ICD-10-CM

## 2019-03-11 DIAGNOSIS — T50916A Underdosing of multiple unspecified drugs, medicaments and biological substances, initial encounter: Secondary | ICD-10-CM | POA: Diagnosis present

## 2019-03-11 DIAGNOSIS — I252 Old myocardial infarction: Secondary | ICD-10-CM

## 2019-03-11 DIAGNOSIS — Z8711 Personal history of peptic ulcer disease: Secondary | ICD-10-CM

## 2019-03-11 DIAGNOSIS — D573 Sickle-cell trait: Secondary | ICD-10-CM | POA: Diagnosis present

## 2019-03-11 DIAGNOSIS — Z888 Allergy status to other drugs, medicaments and biological substances status: Secondary | ICD-10-CM

## 2019-03-11 DIAGNOSIS — Z7982 Long term (current) use of aspirin: Secondary | ICD-10-CM

## 2019-03-11 DIAGNOSIS — M797 Fibromyalgia: Secondary | ICD-10-CM | POA: Diagnosis present

## 2019-03-11 DIAGNOSIS — Z8249 Family history of ischemic heart disease and other diseases of the circulatory system: Secondary | ICD-10-CM

## 2019-03-11 DIAGNOSIS — R1013 Epigastric pain: Secondary | ICD-10-CM

## 2019-03-11 DIAGNOSIS — J4520 Mild intermittent asthma, uncomplicated: Secondary | ICD-10-CM | POA: Diagnosis present

## 2019-03-11 DIAGNOSIS — M069 Rheumatoid arthritis, unspecified: Secondary | ICD-10-CM

## 2019-03-11 DIAGNOSIS — K5900 Constipation, unspecified: Secondary | ICD-10-CM | POA: Diagnosis present

## 2019-03-11 DIAGNOSIS — I10 Essential (primary) hypertension: Secondary | ICD-10-CM | POA: Diagnosis present

## 2019-03-11 DIAGNOSIS — M329 Systemic lupus erythematosus, unspecified: Secondary | ICD-10-CM

## 2019-03-11 DIAGNOSIS — D8989 Other specified disorders involving the immune mechanism, not elsewhere classified: Secondary | ICD-10-CM

## 2019-03-11 DIAGNOSIS — Z79899 Other long term (current) drug therapy: Secondary | ICD-10-CM

## 2019-03-11 DIAGNOSIS — K449 Diaphragmatic hernia without obstruction or gangrene: Secondary | ICD-10-CM | POA: Diagnosis present

## 2019-03-11 DIAGNOSIS — F419 Anxiety disorder, unspecified: Secondary | ICD-10-CM | POA: Diagnosis present

## 2019-03-11 LAB — CBC WITH DIFFERENTIAL/PLATELET
Abs Immature Granulocytes: 0.01 10*3/uL (ref 0.00–0.07)
Basophils Absolute: 0 10*3/uL (ref 0.0–0.1)
Basophils Relative: 1 %
Eosinophils Absolute: 0 10*3/uL (ref 0.0–0.5)
Eosinophils Relative: 1 %
HCT: 41.3 % (ref 36.0–46.0)
Hemoglobin: 12.9 g/dL (ref 12.0–15.0)
Immature Granulocytes: 0 %
Lymphocytes Relative: 10 %
Lymphs Abs: 0.6 10*3/uL — ABNORMAL LOW (ref 0.7–4.0)
MCH: 29 pg (ref 26.0–34.0)
MCHC: 31.2 g/dL (ref 30.0–36.0)
MCV: 92.8 fL (ref 80.0–100.0)
Monocytes Absolute: 0.4 10*3/uL (ref 0.1–1.0)
Monocytes Relative: 6 %
Neutro Abs: 4.7 10*3/uL (ref 1.7–7.7)
Neutrophils Relative %: 82 %
Platelets: 329 10*3/uL (ref 150–400)
RBC: 4.45 MIL/uL (ref 3.87–5.11)
RDW: 14.4 % (ref 11.5–15.5)
WBC: 5.7 10*3/uL (ref 4.0–10.5)
nRBC: 0 % (ref 0.0–0.2)

## 2019-03-11 LAB — GLUCOSE, CAPILLARY
Glucose-Capillary: 65 mg/dL — ABNORMAL LOW (ref 70–99)
Glucose-Capillary: 119 mg/dL — ABNORMAL HIGH (ref 70–99)

## 2019-03-11 LAB — COMPREHENSIVE METABOLIC PANEL
ALT: 16 U/L (ref 0–44)
AST: 20 U/L (ref 15–41)
Albumin: 3.9 g/dL (ref 3.5–5.0)
Alkaline Phosphatase: 66 U/L (ref 38–126)
Anion gap: 13 (ref 5–15)
BUN: 6 mg/dL (ref 6–20)
CO2: 25 mmol/L (ref 22–32)
Calcium: 9.2 mg/dL (ref 8.9–10.3)
Chloride: 103 mmol/L (ref 98–111)
Creatinine, Ser: 0.8 mg/dL (ref 0.44–1.00)
GFR calc Af Amer: >60 mL/min (ref >60)
GFR calc non Af Amer: >60 mL/min (ref >60)
Glucose, Bld: 92 mg/dL (ref 70–99)
Potassium: 3.7 mmol/L (ref 3.5–5.1)
Sodium: 141 mmol/L (ref 135–145)
Total Bilirubin: 1.3 mg/dL — ABNORMAL HIGH (ref 0.3–1.2)
Total Protein: 7.9 g/dL (ref 6.5–8.1)

## 2019-03-11 LAB — CBC
HCT: 39.4 % (ref 36.0–46.0)
Hemoglobin: 12.3 g/dL (ref 12.0–15.0)
MCH: 28.7 pg (ref 26.0–34.0)
MCHC: 31.2 g/dL (ref 30.0–36.0)
MCV: 92.1 fL (ref 80.0–100.0)
Platelets: 318 10*3/uL (ref 150–400)
RBC: 4.28 MIL/uL (ref 3.87–5.11)
RDW: 14.4 % (ref 11.5–15.5)
WBC: 5.3 10*3/uL (ref 4.0–10.5)
nRBC: 0 % (ref 0.0–0.2)

## 2019-03-11 LAB — SARS CORONAVIRUS 2 (TAT 6-24 HRS): SARS Coronavirus 2: NEGATIVE

## 2019-03-11 LAB — LIPASE, BLOOD: Lipase: 23 U/L (ref 11–51)

## 2019-03-11 LAB — CREATININE, SERUM
Creatinine, Ser: 0.71 mg/dL (ref 0.44–1.00)
GFR calc Af Amer: >60 mL/min (ref >60)
GFR calc non Af Amer: >60 mL/min (ref >60)

## 2019-03-11 MED ORDER — ALUM & MAG HYDROXIDE-SIMETH 200-200-20 MG/5ML PO SUSP
30.0000 mL | Freq: Once | ORAL | Status: AC
  Administered 2019-03-11: 13:00:00 30 mL via ORAL
  Filled 2019-03-11: qty 30

## 2019-03-11 MED ORDER — FOLIC ACID 5 MG/ML IJ SOLN
1.0000 mg | Freq: Every day | INTRAMUSCULAR | Status: DC
  Administered 2019-03-11 – 2019-03-14 (×4): 1 mg via INTRAVENOUS
  Filled 2019-03-11 (×5): qty 0.2

## 2019-03-11 MED ORDER — DICLOFENAC SODIUM 1 % TD GEL
2.0000 g | Freq: Four times a day (QID) | TRANSDERMAL | Status: DC | PRN
  Filled 2019-03-11: qty 100

## 2019-03-11 MED ORDER — ENOXAPARIN SODIUM 40 MG/0.4ML ~~LOC~~ SOLN
40.0000 mg | SUBCUTANEOUS | Status: DC
  Administered 2019-03-11 – 2019-03-14 (×4): 40 mg via SUBCUTANEOUS
  Filled 2019-03-11 (×4): qty 0.4

## 2019-03-11 MED ORDER — ALBUTEROL SULFATE (2.5 MG/3ML) 0.083% IN NEBU
2.5000 mg | INHALATION_SOLUTION | Freq: Four times a day (QID) | RESPIRATORY_TRACT | Status: DC | PRN
  Administered 2019-03-13: 07:00:00 2.5 mg via RESPIRATORY_TRACT
  Filled 2019-03-11: qty 3

## 2019-03-11 MED ORDER — DICYCLOMINE HCL 10 MG/ML IM SOLN
20.0000 mg | Freq: Once | INTRAMUSCULAR | Status: AC
  Administered 2019-03-11: 13:00:00 20 mg via INTRAMUSCULAR
  Filled 2019-03-11: qty 2

## 2019-03-11 MED ORDER — ONDANSETRON HCL 4 MG PO TABS: 4.0000 mg | ORAL_TABLET | Freq: Four times a day (QID) | ORAL | Status: DC | PRN

## 2019-03-11 MED ORDER — DEXAMETHASONE SODIUM PHOSPHATE 4 MG/ML IJ SOLN
1.0000 mg | INTRAMUSCULAR | Status: DC
  Administered 2019-03-11 – 2019-03-14 (×4): 1 mg via INTRAVENOUS
  Filled 2019-03-11 (×4): qty 1

## 2019-03-11 MED ORDER — ACETAMINOPHEN 650 MG RE SUPP: 650.0000 mg | Freq: Four times a day (QID) | RECTAL | Status: DC | PRN

## 2019-03-11 MED ORDER — LABETALOL HCL 5 MG/ML IV SOLN: 5.0000 mg | INTRAVENOUS | Status: DC | PRN

## 2019-03-11 MED ORDER — ALBUTEROL SULFATE HFA 108 (90 BASE) MCG/ACT IN AERS: 2.0000 | INHALATION_SPRAY | Freq: Four times a day (QID) | RESPIRATORY_TRACT | Status: DC | PRN

## 2019-03-11 MED ORDER — PANTOPRAZOLE SODIUM 40 MG IV SOLR
40.0000 mg | Freq: Two times a day (BID) | INTRAVENOUS | Status: DC
  Administered 2019-03-11 – 2019-03-14 (×7): 40 mg via INTRAVENOUS
  Filled 2019-03-11 (×7): qty 40

## 2019-03-11 MED ORDER — SORBITOL 70 % SOLN: 30.0000 mL | Freq: Every day | Status: DC | PRN

## 2019-03-11 MED ORDER — ONDANSETRON HCL 4 MG/2ML IJ SOLN: 4.0000 mg | Freq: Four times a day (QID) | INTRAMUSCULAR | Status: DC | PRN

## 2019-03-11 MED ORDER — DEXTROSE 50 % IV SOLN
INTRAVENOUS | Status: AC
  Filled 2019-03-11: qty 50

## 2019-03-11 MED ORDER — FAMOTIDINE IN NACL 20-0.9 MG/50ML-% IV SOLN
20.0000 mg | Freq: Once | INTRAVENOUS | Status: AC
  Administered 2019-03-11: 13:00:00 20 mg via INTRAVENOUS
  Filled 2019-03-11: qty 50

## 2019-03-11 MED ORDER — DEXTROSE-NACL 5-0.45 % IV SOLN: INTRAVENOUS | Status: AC

## 2019-03-11 MED ORDER — SODIUM CHLORIDE 0.9 % IV SOLN: INTRAVENOUS | Status: DC

## 2019-03-11 MED ORDER — DICYCLOMINE HCL 10 MG/ML IM SOLN
20.0000 mg | Freq: Three times a day (TID) | INTRAMUSCULAR | Status: DC | PRN
  Filled 2019-03-11: qty 2

## 2019-03-11 MED ORDER — DEXTROSE 50 % IV SOLN
INTRAVENOUS | Status: AC
  Administered 2019-03-11: 15:00:00 25 mL
  Filled 2019-03-11: qty 50

## 2019-03-11 MED ORDER — ACETAMINOPHEN 325 MG PO TABS
650.0000 mg | ORAL_TABLET | Freq: Four times a day (QID) | ORAL | Status: DC | PRN
  Administered 2019-03-11: 21:00:00 650 mg via ORAL
  Filled 2019-03-11: qty 2

## 2019-03-11 NOTE — H&P (Signed)
History and Physical    Patricia Reed CVE:[id number] DOB: 03/30/1960 DOA: 03/11/2019  PCP: [place], [name], MD  Patient coming from: Home  Chief Complaint: Abdominal pain, cannot tolerate PO  HPI: Patricia Reed is a 59 y.o. female with medical history significant of rheumatoid arthritis, lupus, fibromyalgia, elevated blood pressure, HLD, GERD, h/o gastric ulcer, asthma who presents for 2 weeks of worsening abdominal pain, nausea and 2 days of vomiting progressing to inability to keep anything down.  She notes that she went to her GI doctor about 2 weeks ago due to concern for black stools.  She was checked and no blood was found.  She has not seen this since.  At the time, the provider advised BID dosing of her protonix which she has been doing.  She has 10/10 epigastric abdominal pain, nausea and regurgitation of all food and liquids since yesterday.  Pain is like a punch in the stomach.  No fever, no chills.  Her body feels restless and she is not able to sleep.  She has had harder stools.  She has had no hematemesis or chest pain.  She is curious if this could have occurred due to starting amlodipine and benazapril 1 month ago.  She feels that since that time her symptoms have slowly been escalating and worsening.  She was not previously on anti-hypertensives and notes her PCP was concerned for stroke her BP was so high.  She has not taken her medications for 2 days and notes that her BP has been doing okay.  No headache or vision changes.   ED Course: In the ED, she had blood work which is normal.  She has tried to drink a bit of water without success.  Bentyl IV helped her symptoms and she has been able to rest.  Dr. Elnoria Howard was contacted and will plan for an EGD tomorrow morning.   Review of Systems: As per HPI otherwise 10 point review of systems negative.    Past Medical History:  Diagnosis Date  . Allergy   . Anemia   . Asthma   . Blood transfusion without reported diagnosis   .  Fibromyalgia   . GERD (gastroesophageal reflux disease)   . Heart murmur   . High cholesterol   . History of stomach ulcers   . Hypertension   . Lupus (HCC)   . MI, old   . Rheumatoid arthritis (HCC)   . Thyroid disease   . Thyroid mass   . Vertigo     Past Surgical History:  Procedure Laterality Date  . ABDOMINAL HYSTERECTOMY     done for menorrhagia, ovaries remain  . CERVICAL DISCECTOMY     plates and screws in neck  . CESAREAN SECTION    . COLONOSCOPY    . ESOPHAGEAL MANOMETRY N/A 02/12/2014   Procedure: ESOPHAGEAL MANOMETRY (EM);  Surgeon: Louis Meckel, MD;  Location: [facility] ENDOSCOPY;  Service: Endoscopy;  Laterality: N/A;  . ESOPHAGOGASTRODUODENOSCOPY  03/04/2011   Procedure: ESOPHAGOGASTRODUODENOSCOPY (EGD);  Surgeon: Louis Meckel, MD;  Location: [facility];  Service: Endoscopy;  Laterality: N/A;  BOTOX Injection  . exc benign breast lump    . TONSILLECTOMY    . UPPER GASTROINTESTINAL ENDOSCOPY     Reviewed with patient.  She notes that she threw up wine as well, but she is not a big ETOH user.   reports that she has never smoked. She has never used smokeless tobacco. She reports current alcohol use of about 1.0  standard drinks of alcohol per week. She reports that she does not use drugs.  Allergies  Allergen Reactions  . Aspirin Other (See Comments)    GI bleed  . Demerol [Meperidine]   . Hydrocodone-Acetaminophen Hives and Nausea And Vomiting  . Ibuprofen Other (See Comments)    GI bleed  . Imdur [Isosorbide Nitrate] Other (See Comments)    Reaction unknown  . Meperidine Hcl Hives and Nausea And Vomiting    Short term memory loss  . Morphine Hives and Nausea And Vomiting  . Oxycodone-Acetaminophen Hives and Nausea And Vomiting    Reaction unknown  . Procaine Hcl Other (See Comments)    Ineffective  . Toprol Xl [Metoprolol] Other (See Comments)    Reaction unknown  . Tramadol Itching    Family History  Problem Relation Age of Onset  . Alcohol abuse  Father   . Hypertension Father   . Heart disease Father   . Other Mother        tobacco use disorder, refuses to see a doctor  . Colon cancer Maternal Uncle   . Diabetes Maternal Grandmother   . Colon polyps Neg Hx   . Esophageal cancer Neg Hx   . Kidney disease Neg Hx   . Stomach cancer Neg Hx   . Rectal cancer Neg Hx    Reviewed with patient.  Prior to Admission medications   Medication Sig Start Date End Date Taking? Authorizing Provider  acetaminophen (TYLENOL) 650 MG CR tablet Take 650 mg by mouth daily.     [provider]  albuterol (VENTOLIN HFA) 108 (90 Base) MCG/ACT inhaler Inhale 2 puffs into the lungs every 6 (six) hours as needed for wheezing or shortness of breath. 08/19/18   Glenford Bayley, NP  amLODipine (NORVASC) 10 MG tablet Take 1 tablet (10 mg total) by mouth daily. 11/25/18   [place], [name], MD  aspirin 81 MG tablet Take 81 mg by mouth daily.    [provider]  B-D INS SYR ULTRAFINE 1CC/30G 30G X 1/2" 1 ML MISC  03/05/18   [provider]  baclofen (LIORESAL) 10 MG tablet TAKE 1 TABLET BY MOUTH AT BEDTIME AS NEEDED FOR MUSCLE SPASMS 02/17/19   [place], [name], MD  benazepril (LOTENSIN) 10 MG tablet Take 1 tablet (10 mg total) by mouth daily. 01/11/19   [place], [name], MD  diclofenac sodium (VOLTAREN) 1 % GEL Apply 3 g to 3 large joints up to 3 times daily. 05/11/17   Gearldine Bienenstock, PA-C  folic acid (FOLVITE) 1 MG tablet TAKE 2 TABLETS (2 MG TOTAL) BY MOUTH DAILY. 07/13/18   Pollyann Savoy, MD  methotrexate 50 MG/2ML injection Inject 0.6 mLs (15 mg total) into the skin once a week. 12/28/18   Pollyann Savoy, MD  pantoprazole (PROTONIX) 40 MG tablet Take 1 tablet (40 mg total) by mouth 2 (two) times daily. 02/28/19   Arnaldo Natal, NP  predniSONE (DELTASONE) 5 MG tablet Take 2.5 tablets by mouth daily. 04/05/18   [provider]  Tuberculin-Allergy Syringes (ALLERGY SYRINGE 1CC/27GX1/2") 27G X 1/2" 1 ML MISC Patient to  use to inject MTX weekly 08/21/16   Pollyann Savoy, MD  valACYclovir (VALTREX) 500 MG tablet TAKE 1 TABLET (500 MG TOTAL) BY MOUTH 2 (TWO) TIMES DAILY. FOR 3 DAYS WITH ACUTE EPISODES. 01/31/19   [place], [name], MD  vitamin B-12 (CYANOCOBALAMIN) 100 MCG tablet Take 100 mcg by mouth daily.    [provider]  VITAMIN  D, ERGOCALCIFEROL, PO Take 1 capsule by mouth daily.    [provider]  XELJANZ XR 11 MG TB24 Take 1 tablet by mouth daily. 03/02/18   [provider]    Physical Exam: Vitals:   03/11/19 1120 03/11/19 1121 03/11/19 1245 03/11/19 1300  BP: (!) 139/95 (!) 139/95 124/78 119/77  Pulse: 77 82  66  Resp: 16 17  18   Temp: 98.3 F (36.8 C) 98.3 F (36.8 C)    TempSrc: Oral     SpO2: 100% 100%  100%  Weight:      Height:        Constitutional: NAD, calm, comfortable, lying in bed Eyes: lids and conjunctivae normal, EOMI ENMT: Mucous membranes are mildly dry, neck is supple without any lymphadenopathy Respiratory: no wheezing. Normal respiratory effort. No accessory muscle use.  She is resting comfortably and saturating 100% on RA Cardiovascular: RR, NR, no murmur.  No pedal edema Abdomen: + TTP in epigastrium, voluntary guarding noted, + bowel sounds, but mildly hypoactive, no erythema of throat noted.  Musculoskeletal: no clubbing / cyanosis.   Normal muscle tone for age  Skin: no rashes, lesions, ulcers on exposed skin.  She feels skin is dry and there is some flaking on arms.  Neurologic: Grossly intact, moving easily in bed. EOMI.  Alert Psychiatric: Normal judgment and insight. Normal mood.    Labs on Admission: I have personally reviewed following labs and imaging studies  CBC: Recent Labs  Lab 03/11/19 1130  WBC 5.7  NEUTROABS 4.7  HGB 12.9  HCT 41.3  MCV 92.8  PLT 329   Basic Metabolic Panel: Recent Labs  Lab 03/11/19 1130  NA 141  K 3.7  CL 103  CO2 25  GLUCOSE 92  BUN 6  CREATININE 0.80  CALCIUM 9.2   GFR: Estimated  Creatinine Clearance: 91 mL/min (by C-G formula based on SCr of 0.8 mg/dL). Liver Function Tests: Recent Labs  Lab 03/11/19 1130  AST 20  ALT 16  ALKPHOS 66  BILITOT 1.3*  PROT 7.9  ALBUMIN 3.9   Recent Labs  Lab 03/11/19 1130  LIPASE 23   No results for input(s): AMMONIA in the last 168 hours. Coagulation Profile: No results for input(s): INR, PROTIME in the last 168 hours. Cardiac Enzymes: No results for input(s): CKTOTAL, CKMB, CKMBINDEX, TROPONINI in the last 168 hours. BNP (last 3 results) No results for input(s): PROBNP in the last 8760 hours. HbA1C: No results for input(s): HGBA1C in the last 72 hours. CBG: No results for input(s): GLUCAP in the last 168 hours. Lipid Profile: No results for input(s): CHOL, HDL, LDLCALC, TRIG, CHOLHDL, LDLDIRECT in the last 72 hours. Thyroid Function Tests: No results for input(s): TSH, T4TOTAL, FREET4, T3FREE, THYROIDAB in the last 72 hours. Anemia Panel: No results for input(s): VITAMINB12, FOLATE, FERRITIN, TIBC, IRON, RETICCTPCT in the last 72 hours. Urine analysis:    Component Value Date/Time   COLORURINE YELLOW 05/26/2018 1034   APPEARANCEUR CLEAR 05/26/2018 1034   LABSPEC 1.033 05/26/2018 1034   PHURINE 5.5 05/26/2018 1034   GLUCOSEU NEGATIVE 05/26/2018 1034   HGBUR NEGATIVE 05/26/2018 1034   BILIRUBINUR NEGATIVE 12/30/2016 1807   KETONESUR NEGATIVE 05/26/2018 1034   PROTEINUR NEGATIVE 05/26/2018 1034   UROBILINOGEN 1.0 08/21/2013 2254   NITRITE NEGATIVE 05/26/2018 1034   LEUKOCYTESUR NEGATIVE 05/26/2018 1034    Radiological Exams on Admission: DG Abdomen Acute W/Chest  Result Date: 03/11/2019 CLINICAL DATA:  Epigastric pain.  History of peptic ulcer disease. EXAM:  DG ABDOMEN ACUTE W/ 1V CHEST COMPARISON:  Chest x-ray 08/19/2018 FINDINGS: Lungs are adequately inflated without airspace consolidation or effusion. Cardiomediastinal silhouette and remainder of the chest is unchanged. Abdominopelvic images demonstrate a  nonspecific, nonobstructive bowel gas pattern with several air-filled nondilated small bowel loops. Air is present throughout the colon. There is no free peritoneal air or significant air-fluid levels. Mild degenerative change of the spine. Several pelvic phleboliths are present. IMPRESSION: Nonobstructive bowel gas pattern. No acute cardiopulmonary disease. Electronically Signed   By: Marin Olp M.D.   On: 03/11/2019 12:20    Assessment/Plan Nausea & vomiting, decreased PO intake, abdominal pain GERD History of stomach ulcers - GI will see patient and plan to do EGD tomorrow - NPO, she has not been able to tolerate water in the ED - IV Protonix BID - IV Bentyl PRN for pain - IV Zofran for nausea - Avoid IV or IM NSAIDs - Tylenol PR if needed - She is allergic to a broad range of opiates; hives being the main issue.  Consider pre-treating with benadryl if pain is unmanageable going forward - IVF with 1/[id number] at 75cc/hr    Mild intermittent asthma - Continue PRN albuterol    HTN (hypertension) - She notes a relatively new diagnosis and worsening symptoms with her medications - Stop amlodipine and benazepril - IV labetalol if BP becomes severely high and reassess (noted to have a reaction to Toprol XL, but it is not defined in chart)    Autoimmune disease Fibromyalgia Rheumatoid Arthritis - She is on Morrie Sheldon which will be held while in house and methotrexate - Continue folate 1mg  IV - Transition prednisone daily to 1mg  of dexamethasone daily to avoid adrenal insufficiency - Pain control with diclofenac and tylenol - Holding baclofen at this time  DVT prophylaxis: Lovenox  Code Status: Full  Disposition Plan: admit for procedure tomorrow  Consults called: Dr. [name], he has added himself to treatment team Admission status: Med Surg, Obs    Gilles Chiquito MD [facility]  If 7PM-7AM, please contact night-coverage [email or web address] Use universal [place] password for  that web site. If you do not have the password, please call the hospital operator.  03/11/2019, 1:54 PM

## 2019-03-11 NOTE — ED Triage Notes (Signed)
Pt states that GI specialist at [place] instructed her to come today, Dr. Elnoria Howard told her that EDP would be communicating with them about possible hospitalization, has history of hospitalizations for ulcers, has required blood transfusions. Has not experienced bleeding during this episode.

## 2019-03-11 NOTE — ED Triage Notes (Signed)
Pt. Stated, I have ulcers in my stomach and they have been flared up for 2 weeks now. Im unable to eat or drink and anything I smell makles me sick on my stomacg.

## 2019-03-11 NOTE — ED Provider Notes (Signed)
[facility] EMERGENCY DEPARTMENT Provider Note   CSN: [id number] Arrival date & time: 03/11/19  1113     History Chief Complaint  Patient presents with  . Abdominal Pain    Patricia Reed is a 59 y.o. female with a past medical history of hypertension, rheumatoid arthritis, GERD, fibromyalgia, history of UGI 2/2 large gastric ulcer in 1996 presenting to the ED with a chief complaint of epigastric abdominal pain, vomiting for the past 2 weeks.  Patient was seen and evaluated by her gastroenterologist on 02/27/2019, told to increase her Protonix from 40 mg to 80 mg for the symptoms.  She was also noticing dark tarry stools at the time but had a negative Hemoccult test at the GI doctor's office.  Since then the stools have improved.  She continues to have nausea and inability to tolerate p.o, states that she cannot even tolerate water without feelings of nausea and heaving.  She has not been taking the other medications to help with her symptoms but has been compliant with her Protonix.  No sick contacts with similar symptoms.  Denies any hematemesis, chest pain, shortness of breath, urinary symptoms. Last EGD was on 03/21/2018 which was normal.  HPI     Past Medical History:  Diagnosis Date  . Allergy   . Anemia   . Asthma   . Blood transfusion without reported diagnosis   . Fibromyalgia   . GERD (gastroesophageal reflux disease)   . Heart murmur   . High cholesterol   . History of stomach ulcers   . Hypertension   . Lupus (HCC)   . MI, old   . Rheumatoid arthritis (HCC)   . Thyroid disease   . Thyroid mass   . Vertigo     Patient Active Problem List   Diagnosis Date Noted  . Nausea without vomiting 02/28/2019  . Class 2 obesity with body mass index (BMI) of 37.0 to 37.9 in adult 11/25/2018  . Mixed connective tissue disease (HCC) 08/14/2017  . Recurrent genital herpes 05/12/2017  . Shortness of breath 04/15/2017  . Dizziness 02/04/2017  . Situational  anxiety 06/08/2016  . Atypical chest pain 06/08/2016  . Trapezius muscle spasm 04/23/2016  . Fibromyalgia 02/13/2016  . 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 Chronic fatigue 02/13/2016  . Vitamin D deficiency 11/25/2015  . Autoimmune disease (HCC) 11/23/2015  . High risk medication use 11/23/2015  . Colon cancer screening 01/25/2014  . Anterior neck pain 12/01/2013  . Inflammatory arthritis 08/18/2012  . Allergic rhinitis 06/23/2012  . HTN (hypertension) 06/23/2012  . High cholesterol 06/23/2012  . Thyroid nodule - benign 06/23/2012  . Arthralgia 06/23/2012  . Mild intermittent asthma   . Sickle cell trait (HCC)   . History of stomach ulcers   . ESOPHAGEAL STRICTURE 10/04/2007  . GERD 10/04/2007  . 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 Dysphagia, pharyngoesophageal phase 10/04/2007    Past Surgical History:  Procedure Laterality Date  . ABDOMINAL HYSTERECTOMY     done for menorrhagia, ovaries remain  . CERVICAL DISCECTOMY     plates and screws in neck  . CESAREAN SECTION    . COLONOSCOPY    . ESOPHAGEAL MANOMETRY N/A 02/12/2014   Procedure: ESOPHAGEAL MANOMETRY (EM);  Surgeon: Louis Meckel, MD;  Location: [facility];  Service: Endoscopy;  Laterality: N/A;  . ESOPHAGOGASTRODUODENOSCOPY  03/04/2011   Procedure: ESOPHAGOGASTRODUODENOSCOPY (EGD);  Surgeon: Louis Meckel, MD;  Location: [facility];  Service: Endoscopy;  Laterality: N/A;  BOTOX Injection  .  exc benign breast lump    . TONSILLECTOMY    . UPPER GASTROINTESTINAL ENDOSCOPY       OB History    Gravida  5   Para  3   Term  3   Preterm      AB      Living        SAB      TAB      Ectopic      Multiple      Live Births              Family History  Problem Relation Age of Onset  . Alcohol abuse Father   . Hypertension Father   . Heart disease Father   . Colon cancer Maternal Uncle   . Diabetes Maternal Grandmother   . Colon polyps Neg Hx   . Esophageal cancer Neg Hx   . Kidney disease Neg  Hx   . Stomach cancer Neg Hx   . Rectal cancer Neg Hx     Social History   Tobacco Use  . Smoking status: Never Smoker  . Smokeless tobacco: Never Used  Substance Use Topics  . Alcohol use: Yes    Alcohol/week: 1.0 standard drinks    Types: 1 Glasses of wine per week    Comment: occ  . Drug use: No    Home Medications Prior to Admission medications   Medication Sig Start Date End Date Taking? Authorizing Provider  acetaminophen (TYLENOL) 650 MG CR tablet Take 650 mg by mouth daily.     [provider]  albuterol (VENTOLIN HFA) 108 (90 Base) MCG/ACT inhaler Inhale 2 puffs into the lungs every 6 (six) hours as needed for wheezing or shortness of breath. 08/19/18   Martyn Ehrich, NP  amLODipine (NORVASC) 10 MG tablet Take 1 tablet (10 mg total) by mouth daily. 11/25/18   [name], [name], MD  aspirin 81 MG tablet Take 81 mg by mouth daily.    [provider]  B-D INS SYR ULTRAFINE 1CC/30G 30G X 1/2" 1 ML MISC  03/05/18   [provider]  baclofen (LIORESAL) 10 MG tablet TAKE 1 TABLET BY MOUTH AT BEDTIME AS NEEDED FOR MUSCLE SPASMS 02/17/19   [name], [name], MD  benazepril (LOTENSIN) 10 MG tablet Take 1 tablet (10 mg total) by mouth daily. 01/11/19   [name], [name], MD  diclofenac sodium (VOLTAREN) 1 % GEL Apply 3 g to 3 large joints up to 3 times daily. 05/11/17   Ofilia Neas, PA-C  folic acid (FOLVITE) 1 MG tablet TAKE 2 TABLETS (2 MG TOTAL) BY MOUTH DAILY. 07/13/18   Bo Merino, MD  methotrexate 50 MG/2ML injection Inject 0.6 mLs (15 mg total) into the skin once a week. 12/28/18   Bo Merino, MD  pantoprazole (PROTONIX) 40 MG tablet Take 1 tablet (40 mg total) by mouth 2 (two) times daily. 02/28/19   Noralyn Pick, NP  predniSONE (DELTASONE) 5 MG tablet Take 2.5 tablets by mouth daily. 04/05/18   [provider]  Tuberculin-Allergy Syringes (ALLERGY SYRINGE 1CC/27GX1/2") 27G X 1/2" 1 ML MISC Patient to use to inject MTX  weekly 08/21/16   Bo Merino, MD  valACYclovir (VALTREX) 500 MG tablet TAKE 1 TABLET (500 MG TOTAL) BY MOUTH 2 (TWO) TIMES DAILY. FOR 3 DAYS WITH ACUTE EPISODES. 01/31/19   [name], [name], MD  vitamin B-12 (CYANOCOBALAMIN) 100 MCG tablet Take 100 mcg by mouth daily.    [provider]  VITAMIN D, ERGOCALCIFEROL, PO Take 1 capsule by mouth daily.    [provider]  XELJANZ XR 11 MG TB24 Take 1 tablet by mouth daily. 03/02/18   [provider]    Allergies    Aspirin, Demerol [meperidine], Hydrocodone-acetaminophen, Ibuprofen, Imdur [isosorbide nitrate], Meperidine hcl, Morphine, Oxycodone-acetaminophen, Procaine hcl, Toprol xl [metoprolol], and Tramadol  Review of Systems   Review of Systems  Constitutional: Negative for appetite change, chills and fever.  HENT: Negative for ear pain, rhinorrhea, sneezing and sore throat.   Eyes: Negative for photophobia and visual disturbance.  Respiratory: Negative for cough, chest tightness, shortness of breath and wheezing.   Cardiovascular: Negative for chest pain and palpitations.  Gastrointestinal: Positive for abdominal pain, nausea and vomiting. Negative for blood in stool, constipation and diarrhea.  Genitourinary: Negative for dysuria, hematuria and urgency.  Musculoskeletal: Negative for myalgias.  Skin: Negative for rash.  Neurological: Negative for dizziness, weakness and light-headedness.    Physical Exam Updated Vital Signs BP (!) 139/95 (BP Location: Left Arm)   Pulse 82   Temp 98.3 F (36.8 C)   Resp 17   Ht 5\' 5"  (1.651 m)   Wt 98 kg   SpO2 100%   BMI 35.94 kg/m   Physical Exam Vitals and nursing note reviewed.  Constitutional:      General: She is not in acute distress.    Appearance: She is well-developed.  HENT:     Head: Normocephalic and atraumatic.     Nose: Nose normal.  Eyes:     General: No scleral icterus.       Left eye: No discharge.     Conjunctiva/sclera: Conjunctivae  normal.  Cardiovascular:     Rate and Rhythm: Normal rate and regular rhythm.     Heart sounds: Normal heart sounds. No murmur. No friction rub. No gallop.   Pulmonary:     Effort: Pulmonary effort is normal. No respiratory distress.     Breath sounds: Normal breath sounds.  Abdominal:     General: Bowel sounds are normal. There is no distension.     Palpations: Abdomen is soft.     Tenderness: There is abdominal tenderness in the epigastric area. There is no guarding.  Musculoskeletal:        General: Normal range of motion.     Cervical back: Normal range of motion and neck supple.  Skin:    General: Skin is warm and dry.     Findings: No rash.  Neurological:     Mental Status: She is alert.     Motor: No abnormal muscle tone.     Coordination: Coordination normal.     ED Results / Procedures / Treatments   Labs (all labs ordered are listed, but only abnormal results are displayed) Labs Reviewed  COMPREHENSIVE METABOLIC PANEL - Abnormal; Notable for the following components:      Result Value   Total Bilirubin 1.3 (*)    All other components within normal limits  CBC WITH DIFFERENTIAL/PLATELET - Abnormal; Notable for the following components:   Lymphs Abs 0.6 (*)    All other components within normal limits  SARS CORONAVIRUS 2 (TAT 6-24 HRS)  LIPASE, BLOOD    EKG None  Radiology DG Abdomen Acute W/Chest  Result Date: 03/11/2019 CLINICAL DATA:  Epigastric pain.  History of peptic ulcer disease. EXAM: DG ABDOMEN ACUTE W/ 1V CHEST COMPARISON:  Chest x-ray 08/19/2018 FINDINGS: Lungs are adequately inflated without airspace consolidation or effusion. Cardiomediastinal  silhouette and remainder of the chest is unchanged. Abdominopelvic images demonstrate a nonspecific, nonobstructive bowel gas pattern with several air-filled nondilated small bowel loops. Air is present throughout the colon. There is no free peritoneal air or significant air-fluid levels. Mild degenerative  change of the spine. Several pelvic phleboliths are present. IMPRESSION: Nonobstructive bowel gas pattern. No acute cardiopulmonary disease. Electronically Signed   By: Elberta Fortis M.D.   On: 03/11/2019 12:20    Procedures Procedures (including critical care time)  Medications Ordered in ED Medications  famotidine (PEPCID) IVPB 20 mg premix (20 mg Intravenous New Bag/Given 03/11/19 1230)  alum & mag hydroxide-simeth (MAALOX/MYLANTA) 200-200-20 MG/5ML suspension 30 mL (30 mLs Oral Given 03/11/19 1238)  dicyclomine (BENTYL) injection 20 mg (20 mg Intramuscular Given 03/11/19 1231)    ED Course  I have reviewed the triage vital signs and the nursing notes.  Pertinent labs & imaging results that were available during my care of the patient were reviewed by me and considered in my medical decision making (see chart for details).    MDM Rules/Calculators/A&P                      59 year old female presenting to the ED for epigastric pain, nausea, vomiting and inability tolerate anything by mouth.  She has a history of upper GI bleed secondary to a large gastric ulcer back in 1996 and has been taking Protonix.  She was told by GI to increase her Protonix 2 weeks ago but continues to have symptoms.  Abdominal ultrasound without any concerning findings last week.  She contacted her GI doctor today and was sent to the ED for admission due to her nausea, vomiting and decreased p.o. intake as well as potential EGD tomorrow.  Denies any hematemesis or dark stools.  On exam there are tender palpation of the epigastric area without rebound or guarding.  Vital signs within normal limits.  I spoke to Dr. Elnoria Howard of [facility] who asked that we admit to hospitalist service for potential EGD tomorrow.  Will order lab work, acute abdominal series and reassess.  Will give Maalox, Bentyl and Pepcid for symptomatic relief.  Lab work here including CBC, CMP and lipase unremarkable.  Patient will be admitted to  hospitalist service for management of her symptoms and recommended by GI who will perform EGD tomorrow.  Final Clinical Impression(s) / ED Diagnoses Final diagnoses:  Other acute gastritis without hemorrhage    Rx / DC Orders ED Discharge Orders    None      Portions of this note were generated with Dragon dictation software. Dictation errors may occur despite best attempts at proofreading.    Dietrich Pates, PA-C 03/11/19 1245    Vanetta Mulders, MD 03/12/19 1154

## 2019-03-11 NOTE — Progress Notes (Signed)
Pt CBG 65  Pt states she cannot tolerate oral juice or glucose gel at this time Pt received 77mL of 50% dextrose  Informed MD Criselda Peaches MD also aware of medications flagged due to allergies, MD aware and verified  Will recheck CBG in 15 minutes  Will continue to monitor

## 2019-03-12 ENCOUNTER — Observation Stay (HOSPITAL_COMMUNITY): Payer: BC Managed Care – PPO | Admitting: Anesthesiology

## 2019-03-12 ENCOUNTER — Encounter (HOSPITAL_COMMUNITY)
Admission: EM | Disposition: A | Discharge status: Home / Self Care | Payer: Self-pay | Source: Home / Self Care | Attending: Internal Medicine

## 2019-03-12 DIAGNOSIS — I252 Old myocardial infarction: Secondary | ICD-10-CM | POA: Diagnosis not present

## 2019-03-12 DIAGNOSIS — E785 Hyperlipidemia, unspecified: Secondary | ICD-10-CM | POA: Diagnosis present

## 2019-03-12 DIAGNOSIS — Z79899 Other long term (current) drug therapy: Secondary | ICD-10-CM | POA: Diagnosis not present

## 2019-03-12 DIAGNOSIS — Z8249 Family history of ischemic heart disease and other diseases of the circulatory system: Secondary | ICD-10-CM | POA: Diagnosis not present

## 2019-03-12 DIAGNOSIS — K219 Gastro-esophageal reflux disease without esophagitis: Secondary | ICD-10-CM | POA: Diagnosis present

## 2019-03-12 DIAGNOSIS — Z888 Allergy status to other drugs, medicaments and biological substances status: Secondary | ICD-10-CM | POA: Diagnosis not present

## 2019-03-12 DIAGNOSIS — M069 Rheumatoid arthritis, unspecified: Secondary | ICD-10-CM | POA: Diagnosis present

## 2019-03-12 DIAGNOSIS — I10 Essential (primary) hypertension: Secondary | ICD-10-CM | POA: Diagnosis present

## 2019-03-12 DIAGNOSIS — M359 Systemic involvement of connective tissue, unspecified: Secondary | ICD-10-CM

## 2019-03-12 DIAGNOSIS — Z9114 Patient's other noncompliance with medication regimen: Secondary | ICD-10-CM | POA: Diagnosis not present

## 2019-03-12 DIAGNOSIS — F419 Anxiety disorder, unspecified: Secondary | ICD-10-CM | POA: Diagnosis present

## 2019-03-12 DIAGNOSIS — J4520 Mild intermittent asthma, uncomplicated: Secondary | ICD-10-CM | POA: Diagnosis present

## 2019-03-12 DIAGNOSIS — K449 Diaphragmatic hernia without obstruction or gangrene: Secondary | ICD-10-CM | POA: Diagnosis present

## 2019-03-12 DIAGNOSIS — R112 Nausea with vomiting, unspecified: Secondary | ICD-10-CM | POA: Diagnosis present

## 2019-03-12 DIAGNOSIS — Z20822 Contact with and (suspected) exposure to covid-19: Secondary | ICD-10-CM | POA: Diagnosis present

## 2019-03-12 DIAGNOSIS — E860 Dehydration: Secondary | ICD-10-CM | POA: Diagnosis present

## 2019-03-12 DIAGNOSIS — Z8719 Personal history of other diseases of the digestive system: Secondary | ICD-10-CM | POA: Diagnosis not present

## 2019-03-12 DIAGNOSIS — K5900 Constipation, unspecified: Secondary | ICD-10-CM | POA: Diagnosis present

## 2019-03-12 DIAGNOSIS — Z91128 Patient's intentional underdosing of medication regimen for other reason: Secondary | ICD-10-CM | POA: Diagnosis not present

## 2019-03-12 DIAGNOSIS — Z7982 Long term (current) use of aspirin: Secondary | ICD-10-CM | POA: Diagnosis not present

## 2019-03-12 DIAGNOSIS — Z885 Allergy status to narcotic agent status: Secondary | ICD-10-CM | POA: Diagnosis not present

## 2019-03-12 DIAGNOSIS — D573 Sickle-cell trait: Secondary | ICD-10-CM | POA: Diagnosis present

## 2019-03-12 DIAGNOSIS — R1013 Epigastric pain: Secondary | ICD-10-CM | POA: Diagnosis not present

## 2019-03-12 DIAGNOSIS — T50916A Underdosing of multiple unspecified drugs, medicaments and biological substances, initial encounter: Secondary | ICD-10-CM | POA: Diagnosis present

## 2019-03-12 DIAGNOSIS — Z8711 Personal history of peptic ulcer disease: Secondary | ICD-10-CM | POA: Diagnosis not present

## 2019-03-12 DIAGNOSIS — R1115 Cyclical vomiting syndrome unrelated to migraine: Secondary | ICD-10-CM | POA: Diagnosis not present

## 2019-03-12 DIAGNOSIS — M329 Systemic lupus erythematosus, unspecified: Secondary | ICD-10-CM | POA: Diagnosis present

## 2019-03-12 DIAGNOSIS — M797 Fibromyalgia: Secondary | ICD-10-CM | POA: Diagnosis present

## 2019-03-12 DIAGNOSIS — Z886 Allergy status to analgesic agent status: Secondary | ICD-10-CM | POA: Diagnosis not present

## 2019-03-12 DIAGNOSIS — Y92009 Unspecified place in unspecified non-institutional (private) residence as the place of occurrence of the external cause: Secondary | ICD-10-CM | POA: Diagnosis not present

## 2019-03-12 HISTORY — PX: ESOPHAGOGASTRODUODENOSCOPY (EGD) WITH PROPOFOL: SHX5813

## 2019-03-12 LAB — BASIC METABOLIC PANEL
Anion gap: 10 (ref 5–15)
BUN: 7 mg/dL (ref 6–20)
CO2: 24 mmol/L (ref 22–32)
Calcium: 8.8 mg/dL — ABNORMAL LOW (ref 8.9–10.3)
Chloride: 106 mmol/L (ref 98–111)
Creatinine, Ser: 0.77 mg/dL (ref 0.44–1.00)
GFR calc Af Amer: >60 mL/min (ref >60)
GFR calc non Af Amer: >60 mL/min (ref >60)
Glucose, Bld: 95 mg/dL (ref 70–99)
Potassium: 3.5 mmol/L (ref 3.5–5.1)
Sodium: 140 mmol/L (ref 135–145)

## 2019-03-12 LAB — CBC
HCT: 38.7 % (ref 36.0–46.0)
Hemoglobin: 12.2 g/dL (ref 12.0–15.0)
MCH: 29 pg (ref 26.0–34.0)
MCHC: 31.5 g/dL (ref 30.0–36.0)
MCV: 92.1 fL (ref 80.0–100.0)
Platelets: 298 10*3/uL (ref 150–400)
RBC: 4.2 MIL/uL (ref 3.87–5.11)
RDW: 14.5 % (ref 11.5–15.5)
WBC: 4.4 10*3/uL (ref 4.0–10.5)
nRBC: 0 % (ref 0.0–0.2)

## 2019-03-12 LAB — GLUCOSE, CAPILLARY: Glucose-Capillary: 93 mg/dL (ref 70–99)

## 2019-03-12 LAB — HIV ANTIBODY (ROUTINE TESTING W REFLEX): HIV Screen 4th Generation wRfx: NONREACTIVE — AB

## 2019-03-12 SURGERY — ESOPHAGOGASTRODUODENOSCOPY (EGD) WITH PROPOFOL
Anesthesia: Monitor Anesthesia Care

## 2019-03-12 MED ORDER — PROPOFOL 500 MG/50ML IV EMUL
INTRAVENOUS | Status: DC | PRN
  Administered 2019-03-12: 100 ug/kg/min via INTRAVENOUS

## 2019-03-12 MED ORDER — DEXTROSE-NACL 5-0.45 % IV SOLN: INTRAVENOUS | Status: DC

## 2019-03-12 MED ORDER — LACTATED RINGERS IV SOLN: INTRAVENOUS | Status: DC | PRN

## 2019-03-12 MED ORDER — POTASSIUM CHLORIDE CRYS ER 20 MEQ PO TBCR: 40.0000 meq | EXTENDED_RELEASE_TABLET | Freq: Once | ORAL | Status: DC

## 2019-03-12 MED ORDER — POTASSIUM CHLORIDE 20 MEQ PO PACK
40.0000 meq | PACK | Freq: Once | ORAL | Status: DC
  Filled 2019-03-12: qty 2

## 2019-03-12 MED ORDER — LACTATED RINGERS IV SOLN
INTRAVENOUS | Status: AC | PRN
  Administered 2019-03-12: 1000 mL via INTRAVENOUS

## 2019-03-12 MED ORDER — KCL IN DEXTROSE-NACL 40-5-0.9 MEQ/L-%-% IV SOLN
INTRAVENOUS | Status: DC
  Filled 2019-03-12 (×5): qty 1000

## 2019-03-12 MED ORDER — ONDANSETRON HCL 4 MG/2ML IJ SOLN
INTRAMUSCULAR | Status: DC | PRN
  Administered 2019-03-12: 4 mg via INTRAVENOUS

## 2019-03-12 SURGICAL SUPPLY — 15 items

## 2019-03-12 NOTE — Anesthesia Procedure Notes (Signed)
Procedure Name: MAC Date/Time: 03/12/2019 11:35 AM Performed by: Eligha Bridegroom, CRNA Pre-anesthesia Checklist: Patient identified, Emergency Drugs available, Suction available, Patient being monitored and Timeout performed Patient Re-evaluated:Patient Re-evaluated prior to induction Oxygen Delivery Method: Nasal cannula Preoxygenation: Pre-oxygenation with 100% oxygen Induction Type: IV induction

## 2019-03-12 NOTE — Op Note (Signed)
[facility] Patient Name: Patricia Reed Procedure Date : 03/12/2019 MRN: [id number] Attending MD: Jeani Hawking , MD Date of Birth: [birth year] CSN: [id number] Age: 59 Admit Type: Inpatient Procedure:                Upper GI endoscopy Indications:              Nausea with vomiting Providers:                Jeani Hawking, MD, Glory Rosebush, RN, Matthew Folks, Technician Referring MD:              Medicines:                Propofol per Anesthesia Complications:            No immediate complications. Estimated Blood Loss:     Estimated blood loss: none. Procedure:                Pre-Anesthesia Assessment:                           - Prior to the procedure, a History and Physical                            was performed, and patient medications and                            allergies were reviewed. The patient's tolerance of                            previous anesthesia was also reviewed. The risks                            and benefits of the procedure and the sedation                            options and risks were discussed with the patient.                            All questions were answered, and informed consent                            was obtained. Prior Anticoagulants: The patient has                            taken no previous anticoagulant or antiplatelet                            agents. ASA Grade Assessment: II - A patient with                            mild systemic disease. After reviewing the risks  and benefits, the patient was deemed in                            satisfactory condition to undergo the procedure.                           - Sedation was administered by an anesthesia                            professional. Deep sedation was attained.                           After obtaining informed consent, the endoscope was                            passed under direct vision. Throughout  the                            procedure, the patient's blood pressure, pulse, and                            oxygen saturations were monitored continuously. The                            GIF-H190 ([id number] Olympus gastroscope was                            introduced through the mouth, and advanced to the                            second part of duodenum. The upper GI endoscopy was                            accomplished without difficulty. The patient                            tolerated the procedure well. Scope In: Scope Out: Findings:      A large hiatal hernia was present.      The stomach was normal.      The examined duodenum was normal.      The patient appears to have a Type III hiatal hernia. There was anatomic       distortion when initially evaluated and the endoscope need to be passed       along the lesser curvature to reach the distal gastric lumen. Impression:               - Large hiatal hernia.                           - Normal stomach.                           - Normal examined duodenum.                           - No specimens collected. Recommendation:           -  Return patient to hospital ward for ongoing care.                           - NPO.                           - Continue present medications.                           - Upper GI series to evaluate her hiatal hernia. Procedure Code(s):        --- Professional ---                           [phone], Esophagogastroduodenoscopy, flexible,                            transoral; diagnostic, including collection of                            specimen(s) by brushing or washing, when performed                            (separate procedure) Diagnosis Code(s):        --- Professional ---                           K44.9, Diaphragmatic hernia without obstruction or                            gangrene                           R11.2, Nausea with vomiting, unspecified CPT copyright 2019 [employer] Medical  Association. All rights reserved. The codes documented in this report are preliminary and upon coder review may  be revised to meet current compliance requirements. Jeani Hawking, MD Jeani Hawking, MD 03/12/2019 12:00:42 PM This report has been signed electronically. Number of Addenda: 0

## 2019-03-12 NOTE — Consult Note (Signed)
CONSULT NOTE FOR [facility]  Reason for Consult: Nausea and vomiting Referring Physician: [facility]  Yacine Galen [place] HPI: This is a 59 year old female with a PMH of GERD and PUD, HTN, asthma, fibromyalgia, and RA admitted for complaints of nausea and vomiting.  Her symptoms started a couple of weeks ago with upper abdominal pain, nausea, and vomiting.  She was evaluated by GI on 02/28/2019 and her pantoprazole was increased to BID.  Unfortunately her symptoms did not respond and she continued to progress.  She called stating that she was not able to tolerate any PO.  Drinking a sip of water resulted in nausea and vomiting.  The patient denies any problems with odynophagia or any recent antibiotic use.  She reports these intermittent GERD exacerbations, but she is not able to state what breaks the cycle for her.  All she can recall is that she receives "something" through the IV.  There some associated abdominal tenderness in the upper abdomen.  Two months ago she was started on amlodipine and Benicar and she is wondering of these medications have anything to do with her current symptoms.  She read the package labeling and there was the risk of GI upset.  Her EGD one year ago for epigastric pain was normal.  Past Medical History:  Diagnosis Date  . Allergy   . Anemia   . Asthma   . Blood transfusion without reported diagnosis   . Fibromyalgia   . GERD (gastroesophageal reflux disease)   . Heart murmur   . High cholesterol   . History of stomach ulcers   . Hypertension   . Lupus ([place])   . MI, old   . Rheumatoid arthritis ([place])   . Thyroid disease   . Thyroid mass   . Vertigo     Past Surgical History:  Procedure Laterality Date  . ABDOMINAL HYSTERECTOMY     done for menorrhagia, ovaries remain  . CERVICAL DISCECTOMY     plates and screws in neck  . CESAREAN SECTION    . COLONOSCOPY    . ESOPHAGEAL MANOMETRY N/A 02/12/2014   Procedure: ESOPHAGEAL MANOMETRY (EM);  Surgeon:  Inda Castle, MD;  Location: [facility];  Service: Endoscopy;  Laterality: N/A;  . ESOPHAGOGASTRODUODENOSCOPY  03/04/2011   Procedure: ESOPHAGOGASTRODUODENOSCOPY (EGD);  Surgeon: Inda Castle, MD;  Location: [facility];  Service: Endoscopy;  Laterality: N/A;  BOTOX Injection  . exc benign breast lump    . TONSILLECTOMY    . UPPER GASTROINTESTINAL ENDOSCOPY      Family History  Problem Relation Age of Onset  . Alcohol abuse Father   . Hypertension Father   . Heart disease Father   . Other Mother        tobacco use disorder, refuses to see a doctor  . Colon cancer Maternal Uncle   . Diabetes Maternal Grandmother   . Colon polyps Neg Hx   . Esophageal cancer Neg Hx   . Kidney disease Neg Hx   . Stomach cancer Neg Hx   . Rectal cancer Neg Hx     Social History:  reports that she has never smoked. She has never used smokeless tobacco. She reports current alcohol use of about 1.0 standard drinks of alcohol per week. She reports that she does not use drugs.  Allergies:  Allergies  Allergen Reactions  . Aspirin Other (See Comments)    GI bleed  . Demerol [Meperidine]   . Hydrocodone-Acetaminophen Hives and Nausea And  Vomiting  . Ibuprofen Other (See Comments)    GI bleed  . Imdur [Isosorbide Nitrate] Other (See Comments)    Reaction unknown  . Meperidine Hcl Hives and Nausea And Vomiting    Short term memory loss  . Morphine Hives and Nausea And Vomiting  . Oxycodone-Acetaminophen Hives and Nausea And Vomiting    Reaction unknown  . Procaine Hcl Other (See Comments)    Ineffective  . Toprol Xl [Metoprolol] Other (See Comments)    Reaction unknown  . Tramadol Itching    Medications:  Scheduled: . dexamethasone (DECADRON) injection  1 mg Intravenous Q24H  . enoxaparin (LOVENOX) injection  40 mg Subcutaneous Q24H  . folic acid  1 mg Intravenous Daily  . pantoprazole (PROTONIX) IV  40 mg Intravenous Q12H   Continuous: . sodium chloride Stopped (03/12/19 0432)     Results for orders placed or performed during the hospital encounter of 03/11/19 (from the past 24 hour(s))  Comprehensive metabolic panel     Status: Abnormal   Collection Time: 03/11/19 11:30 AM  Result Value Ref Range   Sodium 141 135 - 145 mmol/L   Potassium 3.7 3.5 - 5.1 mmol/L   Chloride 103 98 - 111 mmol/L   CO2 25 22 - 32 mmol/L   Glucose, Bld 92 70 - 99 mg/dL   BUN 6 6 - 20 mg/dL   Creatinine, Ser 8.29 0.44 - 1.00 mg/dL   Calcium 9.2 8.9 - 93.7 mg/dL   Total Protein 7.9 6.5 - 8.1 g/dL   Albumin 3.9 3.5 - 5.0 g/dL   AST 20 15 - 41 U/L   ALT 16 0 - 44 U/L   Alkaline Phosphatase 66 38 - 126 U/L   Total Bilirubin 1.3 (H) 0.3 - 1.2 mg/dL   GFR calc non Af Amer >60 >60 mL/min   GFR calc Af Amer >60 >60 mL/min   Anion gap 13 5 - 15  Lipase, blood     Status: None   Collection Time: 03/11/19 11:30 AM  Result Value Ref Range   Lipase 23 11 - 51 U/L  CBC with Differential     Status: Abnormal   Collection Time: 03/11/19 11:30 AM  Result Value Ref Range   WBC 5.7 4.0 - 10.5 K/uL   RBC 4.45 3.87 - 5.11 MIL/uL   Hemoglobin 12.9 12.0 - 15.0 g/dL   HCT 16.9 67.8 - 93.8 %   MCV 92.8 80.0 - 100.0 fL   MCH 29.0 26.0 - 34.0 pg   MCHC 31.2 30.0 - 36.0 g/dL   RDW 10.1 75.1 - 02.5 %   Platelets 329 150 - 400 K/uL   nRBC 0.0 0.0 - 0.2 %   Neutrophils Relative % 82 %   Neutro Abs 4.7 1.7 - 7.7 K/uL   Lymphocytes Relative 10 %   Lymphs Abs 0.6 (L) 0.7 - 4.0 K/uL   Monocytes Relative 6 %   Monocytes Absolute 0.4 0.1 - 1.0 K/uL   Eosinophils Relative 1 %   Eosinophils Absolute 0.0 0.0 - 0.5 K/uL   Basophils Relative 1 %   Basophils Absolute 0.0 0.0 - 0.1 K/uL   Immature Granulocytes 0 %   Abs Immature Granulocytes 0.01 0.00 - 0.07 K/uL  SARS CORONAVIRUS 2 (TAT 6-24 HRS) Nasopharyngeal Nasopharyngeal Swab     Status: None   Collection Time: 03/11/19  1:00 PM   Specimen: Nasopharyngeal Swab  Result Value Ref Range   SARS Coronavirus 2 NEGATIVE NEGATIVE  HIV Antibody (routine  testing w rflx)     Status: Abnormal   Collection Time: 03/11/19  2:59 PM  Result Value Ref Range   HIV Screen 4th Generation wRfx Non Reactive (A) Non Reactive  CBC     Status: None   Collection Time: 03/11/19  2:59 PM  Result Value Ref Range   WBC 5.3 4.0 - 10.5 K/uL   RBC 4.28 3.87 - 5.11 MIL/uL   Hemoglobin 12.3 12.0 - 15.0 g/dL   HCT 44.0 10.2 - 72.5 %   MCV 92.1 80.0 - 100.0 fL   MCH 28.7 26.0 - 34.0 pg   MCHC 31.2 30.0 - 36.0 g/dL   RDW 36.6 44.0 - 34.7 %   Platelets 318 150 - 400 K/uL   nRBC 0.0 0.0 - 0.2 %  Creatinine, serum     Status: None   Collection Time: 03/11/19  2:59 PM  Result Value Ref Range   Creatinine, Ser 0.71 0.44 - 1.00 mg/dL   GFR calc non Af Amer >60 >60 mL/min   GFR calc Af Amer >60 >60 mL/min  Glucose, capillary     Status: Abnormal   Collection Time: 03/11/19  3:02 PM  Result Value Ref Range   Glucose-Capillary 65 (L) 70 - 99 mg/dL  Glucose, capillary     Status: Abnormal   Collection Time: 03/11/19  3:34 PM  Result Value Ref Range   Glucose-Capillary 119 (H) 70 - 99 mg/dL  Basic metabolic panel     Status: Abnormal   Collection Time: 03/12/19  5:14 AM  Result Value Ref Range   Sodium 140 135 - 145 mmol/L   Potassium 3.5 3.5 - 5.1 mmol/L   Chloride 106 98 - 111 mmol/L   CO2 24 22 - 32 mmol/L   Glucose, Bld 95 70 - 99 mg/dL   BUN 7 6 - 20 mg/dL   Creatinine, Ser 4.25 0.44 - 1.00 mg/dL   Calcium 8.8 (L) 8.9 - 10.3 mg/dL   GFR calc non Af Amer >60 >60 mL/min   GFR calc Af Amer >60 >60 mL/min   Anion gap 10 5 - 15  CBC     Status: None   Collection Time: 03/12/19  5:14 AM  Result Value Ref Range   WBC 4.4 4.0 - 10.5 K/uL   RBC 4.20 3.87 - 5.11 MIL/uL   Hemoglobin 12.2 12.0 - 15.0 g/dL   HCT 95.6 38.7 - 56.4 %   MCV 92.1 80.0 - 100.0 fL   MCH 29.0 26.0 - 34.0 pg   MCHC 31.5 30.0 - 36.0 g/dL   RDW 33.2 95.1 - 88.4 %   Platelets 298 150 - 400 K/uL   nRBC 0.0 0.0 - 0.2 %  Glucose, capillary     Status: None   Collection Time: 03/12/19   6:01 AM  Result Value Ref Range   Glucose-Capillary 93 70 - 99 mg/dL     DG Abdomen Acute W/Chest  Result Date: 03/11/2019 CLINICAL DATA:  Epigastric pain.  History of peptic ulcer disease. EXAM: DG ABDOMEN ACUTE W/ 1V CHEST COMPARISON:  Chest x-ray 08/19/2018 FINDINGS: Lungs are adequately inflated without airspace consolidation or effusion. Cardiomediastinal silhouette and remainder of the chest is unchanged. Abdominopelvic images demonstrate a nonspecific, nonobstructive bowel gas pattern with several air-filled nondilated small bowel loops. Air is present throughout the colon. There is no free peritoneal air or significant air-fluid levels. Mild degenerative change of the spine. Several pelvic phleboliths are present. IMPRESSION: Nonobstructive bowel gas pattern. No acute  cardiopulmonary disease. Electronically Signed   By: Elberta Fortis M.D.   On: 03/11/2019 12:20    ROS:  As stated above in the HPI otherwise negative.  Blood pressure 108/72, pulse 73, temperature 98.1 F (36.7 C), temperature source Oral, resp. rate 18, height 5\' 5"  (1.651 m), weight 98 kg, SpO2 100 %.    PE: Gen: NAD, Alert and Oriented HEENT:  [place]/AT, EOMI Neck: Supple, no LAD Lungs: CTA Bilaterally CV: RRR without M/G/R ABM: Soft, tender in the epigastrium, +BS Ext: No C/C/E  Assessment/Plan: 1) Nausea and vomiting. 2) GERD. 3) History of PUD.   She is unable to tolerate any PO.  As a result she was recommended to be admitted for further evaluation.  Plan: 1) EGD on 03/12/2019.  Kemaria Dedic D 03/12/2019, 7:38 AM

## 2019-03-12 NOTE — Transfer of Care (Signed)
Immediate Anesthesia Transfer of Care Note  [name]  Procedure(s) Performed: ESOPHAGOGASTRODUODENOSCOPY (EGD) WITH PROPOFOL (N/A )  Patient Location: PACU and Endoscopy Unit  Anesthesia Type:MAC  Level of Consciousness: awake, alert  and oriented  Airway & Oxygen Therapy: Patient Spontanous Breathing  Post-op Assessment: Report given to RN and Post -op Vital signs reviewed and stable  Post vital signs: Reviewed and stable  Last Vitals:  Vitals Value Taken Time  BP 105/65 03/12/19 1158  Temp    Pulse 69 03/12/19 1200  Resp 15 03/12/19 1200  SpO2 100 % 03/12/19 1200  Vitals shown include unvalidated device data.  Last Pain:  Vitals:   03/12/19 1158  TempSrc: Oral  PainSc: 0-No pain         Complications: No apparent anesthesia complications

## 2019-03-12 NOTE — Anesthesia Postprocedure Evaluation (Signed)
Anesthesia Post Note  [name]  Procedure(s) Performed: ESOPHAGOGASTRODUODENOSCOPY (EGD) WITH PROPOFOL (N/A )     Patient location during evaluation: Endoscopy Anesthesia Type: MAC Level of consciousness: awake and alert Pain management: pain level controlled Vital Signs Assessment: post-procedure vital signs reviewed and stable Respiratory status: spontaneous breathing, nonlabored ventilation and respiratory function stable Cardiovascular status: stable and blood pressure returned to baseline Postop Assessment: no apparent nausea or vomiting Anesthetic complications: no    Last Vitals:  Vitals:   03/12/19 1216 03/12/19 1231  BP: 114/61 126/83  Pulse: (!) 57 63  Resp: 10 16  Temp:  36.8 C  SpO2: 99% 100%    Last Pain:  Vitals:   03/12/19 1243  TempSrc:   PainSc: 0-No pain                 Syesha Thaw,W. EDMOND

## 2019-03-12 NOTE — Progress Notes (Signed)
PROGRESS NOTE    Patricia Reed  IRW:[id number] DOB: 03/07/1960 DOA: 03/11/2019 PCP: [name], [name], MD   Brief Narrative:  HPI per [name] is a 59 y.o. female with medical history significant of rheumatoid arthritis, lupus, fibromyalgia, elevated blood pressure, HLD, GERD, h/o gastric ulcer, asthma who presented for 2 weeks of worsening abdominal pain, nausea and 2 days of vomiting progressing to inability to keep anything down.  She noted that she went to her GI doctor about 2 weeks ago due to concern for black stools.  She was checked and no blood was found.  She has not seen this since.  At the time, the provider advised BID dosing of her protonix which she has been doing.  She has 10/10 epigastric abdominal pain, nausea and regurgitation of all food and liquids since yesterday.  Pain is like a punch in the stomach.  No fever, no chills.  Her body feels restless and she is not able to sleep.  She has had harder stools.  She has had no hematemesis or chest pain.  She is curious if this could have occurred due to starting amlodipine and benazapril 1 month ago.  She feels that since that time her symptoms have slowly been escalating and worsening.  She was not previously on anti-hypertensives and notes her PCP was concerned for stroke her BP was so high.  She has not taken her medications for 2 days and notes that her BP has been doing okay.  No headache or vision changes.   ED Course: In the ED, she had blood work which is normal.  She has tried to drink a bit of water without success.  Bentyl IV helped her symptoms and she has been able to rest.  Dr. [name] was contacted and will plan for an EGD tomorrow morning.   Assessment & Plan:   Principal Problem:   Nausea & vomiting Active Problems:   GERD   Mild intermittent asthma   History of stomach ulcers   HTN (hypertension)   Autoimmune disease (HCC)   Fibromyalgia  1 nausea/vomiting/decreased oral intake/abdominal  pain/GERD Patient with history of peptic ulcer disease.  Patient currently n.p.o. and patient underwent upper endoscopy this morning per Dr. [name] which showed large hiatal hernia, normal stomach.  GI recommended upper GI series for further evaluation of hiatal hernia.  Patient currently n.p.o.  Continue IV PPI twice daily.  Per GI.  2.  Dehydration IV fluids.  3.  Hypertension Patient stated relatively new diagnosis and felt her symptoms worsened on those medications.  Blood pressure currently stable.  We will hold off on amlodipine and benazepril for now.  IV labetalol as needed.  If blood pressure starts to remain constantly elevated could start on some IV antihypertensive medications.  Monitor for now.  4.  Autoimmune disease/fibromyalgia/rheumatoid arthritis Patient noted to be on Morrie Sheldon which has been on hold during her hospitalization as well as methotrexate.  Continue folate 1 mg daily.  Patient's oral prednisone has been changed to IV dexamethasone.  Baclofen on hold.  Outpatient follow-up.    DVT prophylaxis: Lovenox Code Status: Full Family Communication: Updated patient.  No family at bedside. Disposition Plan:  . Patient came from: Home            . Anticipated d/c place: Home . Barriers to d/c OR conditions which need to be met to effect a safe d/c: Likely home once patient tolerating oral intake and cleared by GI.  Consultants:   Gastroenterology: Dr [name]  Procedures:   Upper endoscopy 03/12/2019  Acute abdominal series 03/11/2019  Antimicrobials:   None   Subjective: Patient laying in bed has just returned from upper endoscopy.  Patient on the telephone.  Denies any shortness of breath.  Denies any chest pain.  Objective: Vitals:   03/12/19 1158 03/12/19 1206 03/12/19 1216 03/12/19 1231  BP: 105/65 (!) 100/58 114/61 126/83  Pulse: 70 67 (!) 57 63  Resp: _0 Temp:    98.3 F (36.8 C)  TempSrc: Oral   Oral  SpO2: 100% 100% 99% 100%  Weight:       Height:        Intake/Output Summary (Last 24 hours) at 03/12/2019 1305 Last data filed at 03/12/2019 1203 Gross per 24 hour  Intake 965.31 ml  Output --  Net 965.31 ml   Filed Weights   03/11/19 1117  Weight: 98 kg    Examination:  General exam: Appears calm and comfortable  Respiratory system: Clear to auscultation. Respiratory effort normal. Cardiovascular system: S1 & S2 heard, RRR. No JVD, murmurs, rubs, gallops or clicks. No pedal edema. Gastrointestinal system: Abdomen is nondistended, soft and nontender. No organomegaly or masses felt. Normal bowel sounds heard. Central nervous system: Alert and oriented. No focal neurological deficits. Extremities: Symmetric 5 x 5 power. Skin: No rashes, lesions or ulcers Psychiatry: Judgement and insight appear normal. Mood & affect appropriate.     Data Reviewed: I have personally reviewed following labs and imaging studies  CBC: Recent Labs  Lab 03/11/19 1130 03/11/19 1459 03/12/19 0514  WBC 5.7 5.3 4.4  NEUTROABS 4.7  --   --   HGB 12.9 12.3 12.2  HCT 41.3 39.4 38.7  MCV 92.8 92.1 92.1  PLT 329 318 179   Basic Metabolic Panel: Recent Labs  Lab 03/11/19 1130 03/11/19 1459 03/12/19 0514  NA 141  --  140  K 3.7  --  3.5  CL 103  --  106  CO2 25  --  24  GLUCOSE 92  --  95  BUN 6  --  7  CREATININE 0.80 0.71 0.77  CALCIUM 9.2  --  8.8*   GFR: Estimated Creatinine Clearance: 91 mL/min (by C-G formula based on SCr of 0.77 mg/dL). Liver Function Tests: Recent Labs  Lab 03/11/19 1130  AST 20  ALT 16  ALKPHOS 66  BILITOT 1.3*  PROT 7.9  ALBUMIN 3.9   Recent Labs  Lab 03/11/19 1130  LIPASE 23   No results for input(s): AMMONIA in the last 168 hours. Coagulation Profile: No results for input(s): INR, PROTIME in the last 168 hours. Cardiac Enzymes: No results for input(s): CKTOTAL, CKMB, CKMBINDEX, TROPONINI in the last 168 hours. BNP (last 3 results) No results for input(s): PROBNP in the last 8760  hours. HbA1C: No results for input(s): HGBA1C in the last 72 hours. CBG: Recent Labs  Lab 03/11/19 1502 03/11/19 1534 03/12/19 0601  GLUCAP 65* 119* 93   Lipid Profile: No results for input(s): CHOL, HDL, LDLCALC, TRIG, CHOLHDL, LDLDIRECT in the last 72 hours. Thyroid Function Tests: No results for input(s): TSH, T4TOTAL, FREET4, T3FREE, THYROIDAB in the last 72 hours. Anemia Panel: No results for input(s): VITAMINB12, FOLATE, FERRITIN, TIBC, IRON, RETICCTPCT in the last 72 hours. Sepsis Labs: No results for input(s): PROCALCITON, LATICACIDVEN in the last 168 hours.  Recent Results (from the past 240 hour(s))  SARS CORONAVIRUS 2 (TAT 6-24 HRS) Nasopharyngeal Nasopharyngeal Swab  Status: None   Collection Time: 03/11/19  1:00 PM   Specimen: Nasopharyngeal Swab  Result Value Ref Range Status   SARS Coronavirus 2 NEGATIVE NEGATIVE Final    Comment: (NOTE) SARS-CoV-2 target nucleic acids are NOT DETECTED. The SARS-CoV-2 RNA is generally detectable in upper and lower respiratory specimens during the acute phase of infection. Negative results do not preclude SARS-CoV-2 infection, do not rule out co-infections with other pathogens, and should not be used as the sole basis for treatment or other patient management decisions. Negative results must be combined with clinical observations, patient history, and epidemiological information. The expected result is Negative. Fact Sheet for Patients: SugarRoll.be Fact Sheet for Healthcare Providers: https://www.woods-mathews.com/ This test is not yet approved or cleared by the [place] FDA and  has been authorized for detection and/or diagnosis of SARS-CoV-2 by FDA under an Emergency Use Authorization (EUA). This EUA will remain  in effect (meaning this test can be used) for the duration of the COVID-19 declaration under Section 56 4(b)(1) of the Act, 21 U.S.C. section 360bbb-3(b)(1), unless  the authorization is terminated or revoked sooner. Performed at [facility], [place] [address], [place], [place] [address]          Radiology Studies: DG Abdomen Acute W/Chest  Result Date: 03/11/2019 CLINICAL DATA:  Epigastric pain.  History of peptic ulcer disease. EXAM: DG ABDOMEN ACUTE W/ 1V CHEST COMPARISON:  Chest x-ray 08/19/2018 FINDINGS: Lungs are adequately inflated without airspace consolidation or effusion. Cardiomediastinal silhouette and remainder of the chest is unchanged. Abdominopelvic images demonstrate a nonspecific, nonobstructive bowel gas pattern with several air-filled nondilated small bowel loops. Air is present throughout the colon. There is no free peritoneal air or significant air-fluid levels. Mild degenerative change of the spine. Several pelvic phleboliths are present. IMPRESSION: Nonobstructive bowel gas pattern. No acute cardiopulmonary disease. Electronically Signed   By: Marin Olp M.D.   On: 03/11/2019 12:20        Scheduled Meds: . dexamethasone (DECADRON) injection  1 mg Intravenous Q24H  . enoxaparin (LOVENOX) injection  40 mg Subcutaneous Q24H  . folic acid  1 mg Intravenous Daily  . pantoprazole (PROTONIX) IV  40 mg Intravenous Q12H   Continuous Infusions: . dextrose 5 % and 0.9 % NaCl with KCl 40 mEq/L 100 mL/hr at 03/12/19 1248     LOS: 0 days    Time spent: 35 minutes    Irine Seal, MD [facility]   To contact the attending provider between 7A-7P or the covering provider during after hours 7P-7A, please log into the web site [email or web address] and access using universal [place] password for that web site. If you do not have the password, please call the hospital operator.  03/12/2019, 1:05 PM

## 2019-03-12 NOTE — Anesthesia Preprocedure Evaluation (Addendum)
Anesthesia Evaluation  Patient identified by MRN, date of birth, ID band Patient awake    Reviewed: Allergy & Precautions, H&P , NPO status , Patient's Chart, lab work & pertinent test results  Airway Mallampati: II  TM Distance: >3 FB Neck ROM: Full    Dental no notable dental hx. (+) Teeth Intact, Dental Advisory Given   Pulmonary asthma ,    Pulmonary exam normal breath sounds clear to auscultation       Cardiovascular hypertension, Pt. on medications  Rhythm:Regular Rate:Normal     Neuro/Psych Anxiety negative neurological ROS     GI/Hepatic Neg liver ROS, GERD  Medicated,  Endo/Other  Morbid obesity  Renal/GU negative Renal ROS  negative genitourinary   Musculoskeletal  (+) Arthritis , Osteoarthritis,  Fibromyalgia -  Abdominal   Peds  Hematology negative hematology ROS (+)   Anesthesia Other Findings   Reproductive/Obstetrics negative OB ROS                            Anesthesia Physical Anesthesia Plan  ASA: III  Anesthesia Plan: MAC   Post-op Pain Management:    Induction: Intravenous  PONV Risk Score and Plan: 2 and Propofol infusion and Treatment may vary due to age or medical condition  Airway Management Planned: Nasal Cannula  Additional Equipment:   Intra-op Plan:   Post-operative Plan:   Informed Consent: I have reviewed the patients History and Physical, chart, labs and discussed the procedure including the risks, benefits and alternatives for the proposed anesthesia with the patient or authorized representative who has indicated his/her understanding and acceptance.     Dental advisory given  Plan Discussed with: CRNA  Anesthesia Plan Comments:         Anesthesia Quick Evaluation

## 2019-03-13 ENCOUNTER — Inpatient Hospital Stay (HOSPITAL_COMMUNITY): Payer: BC Managed Care – PPO

## 2019-03-13 DIAGNOSIS — R1013 Epigastric pain: Secondary | ICD-10-CM

## 2019-03-13 DIAGNOSIS — R1115 Cyclical vomiting syndrome unrelated to migraine: Secondary | ICD-10-CM

## 2019-03-13 LAB — CBC WITH DIFFERENTIAL/PLATELET
Abs Immature Granulocytes: 0.01 10*3/uL (ref 0.00–0.07)
Basophils Absolute: 0 10*3/uL (ref 0.0–0.1)
Basophils Relative: 1 %
Eosinophils Absolute: 0.1 10*3/uL (ref 0.0–0.5)
Eosinophils Relative: 1 %
HCT: 36.1 % (ref 36.0–46.0)
Hemoglobin: 11.2 g/dL — ABNORMAL LOW (ref 12.0–15.0)
Immature Granulocytes: 0 %
Lymphocytes Relative: 16 %
Lymphs Abs: 0.7 10*3/uL (ref 0.7–4.0)
MCH: 28.4 pg (ref 26.0–34.0)
MCHC: 31 g/dL (ref 30.0–36.0)
MCV: 91.6 fL (ref 80.0–100.0)
Monocytes Absolute: 0.3 10*3/uL (ref 0.1–1.0)
Monocytes Relative: 8 %
Neutro Abs: 3.1 10*3/uL (ref 1.7–7.7)
Neutrophils Relative %: 74 %
Platelets: 276 10*3/uL (ref 150–400)
RBC: 3.94 MIL/uL (ref 3.87–5.11)
RDW: 14.3 % (ref 11.5–15.5)
WBC: 4.2 10*3/uL (ref 4.0–10.5)
nRBC: 0 % (ref 0.0–0.2)

## 2019-03-13 LAB — BASIC METABOLIC PANEL
Anion gap: 6 (ref 5–15)
BUN: 6 mg/dL (ref 6–20)
CO2: 24 mmol/L (ref 22–32)
Calcium: 8.3 mg/dL — ABNORMAL LOW (ref 8.9–10.3)
Chloride: 109 mmol/L (ref 98–111)
Creatinine, Ser: 0.8 mg/dL (ref 0.44–1.00)
GFR calc Af Amer: >60 mL/min (ref >60)
GFR calc non Af Amer: >60 mL/min (ref >60)
Glucose, Bld: 103 mg/dL — ABNORMAL HIGH (ref 70–99)
Potassium: 3.6 mmol/L (ref 3.5–5.1)
Sodium: 139 mmol/L (ref 135–145)

## 2019-03-13 LAB — GLUCOSE, CAPILLARY: Glucose-Capillary: 105 mg/dL — ABNORMAL HIGH (ref 70–99)

## 2019-03-13 LAB — MAGNESIUM: Magnesium: 1.7 mg/dL (ref 1.7–2.4)

## 2019-03-13 MED ORDER — BISACODYL 10 MG RE SUPP
10.0000 mg | Freq: Once | RECTAL | Status: AC
  Administered 2019-03-13: 15:00:00 10 mg via RECTAL
  Filled 2019-03-13: qty 1

## 2019-03-13 MED ORDER — MAGNESIUM SULFATE 4 GM/100ML IV SOLN
4.0000 g | Freq: Once | INTRAVENOUS | Status: AC
  Administered 2019-03-13: 4 g via INTRAVENOUS
  Filled 2019-03-13: qty 100

## 2019-03-13 NOTE — Progress Notes (Signed)
Patient off floor for GI series.

## 2019-03-13 NOTE — Progress Notes (Signed)
PROGRESS NOTE    Patricia Reed  OMV:[id number] DOB: 1960/04/26 DOA: 03/11/2019 PCP: [name], [name], MD   Brief Narrative:  HPI per [name] is a 59 y.o. female with medical history significant of rheumatoid arthritis, lupus, fibromyalgia, elevated blood pressure, HLD, GERD, h/o gastric ulcer, asthma who presented for 2 weeks of worsening abdominal pain, nausea and 2 days of vomiting progressing to inability to keep anything down.  She noted that she went to her GI doctor about 2 weeks ago due to concern for black stools.  She was checked and no blood was found.  She has not seen this since.  At the time, the provider advised BID dosing of her protonix which she has been doing.  She has 10/10 epigastric abdominal pain, nausea and regurgitation of all food and liquids since yesterday.  Pain is like a punch in the stomach.  No fever, no chills.  Her body feels restless and she is not able to sleep.  She has had harder stools.  She has had no hematemesis or chest pain.  She is curious if this could have occurred due to starting amlodipine and benazapril 1 month ago.  She feels that since that time her symptoms have slowly been escalating and worsening.  She was not previously on anti-hypertensives and notes her PCP was concerned for stroke her BP was so high.  She has not taken her medications for 2 days and notes that her BP has been doing okay.  No headache or vision changes.   ED Course: In the ED, she had blood work which is normal.  She has tried to drink a bit of water without success.  Bentyl IV helped her symptoms and she has been able to rest.  Dr. [name] was contacted and will plan for an EGD tomorrow morning.   Assessment & Plan:   Principal Problem:   Nausea & vomiting Active Problems:   GERD   Mild intermittent asthma   History of stomach ulcers   HTN (hypertension)   Autoimmune disease (HCC)   Fibromyalgia   Persistent vomiting   Abdominal pain,  epigastric  1 nausea/vomiting/decreased oral intake/abdominal pain/GERD Patient with history of peptic ulcer disease.  Patient currently n.p.o. and patient underwent upper endoscopy on 03/12/2019 per Dr. [name] which showed large hiatal hernia, normal stomach.  GI recommended upper GI series with KUB, which was done today 03/13/2019 which showed mildly sluggish esophageal motility, otherwise unremarkable exam, no esophageal fold thickening, no stricture or obstruction.  GI recommending trial of clear liquids and as such we will advance diet to a clear liquid diet.  Continue IV PPI twice daily.  GI following and appreciate input and recommendations.   2.  Dehydration Continue IV fluids.  3.  Hypertension Patient stated relatively new diagnosis and felt her symptoms worsened on those medications.  Blood pressure currently stable.  Continue to hold off on antihypertensive medications of amlodipine and benazepril.  IV labetalol as needed. Monitor for now.  4.  Autoimmune disease/fibromyalgia/rheumatoid arthritis Patient noted to be on Morrie Sheldon which has been on hold during her hospitalization as well as methotrexate.  Continue folate 1 mg daily.  Patient's oral prednisone has been changed to IV dexamethasone.  Baclofen on hold.  Likely resume oral prednisone on discharge.  Outpatient follow-up.  5.  Constipation Dulcolax suppository x1.    DVT prophylaxis: Lovenox Code Status: Full Family Communication: Updated patient.  Disposition Plan:  . Patient came from: Home            .  Anticipated d/c place: Home . Barriers to d/c OR conditions which need to be met to effect a safe d/c: Likely home once patient tolerating oral intake and cleared by GI hopefully in the next 24 to 48 hours.   Consultants:   Gastroenterology: Dr [name]  Procedures:   Upper endoscopy 03/12/2019  Acute abdominal series 03/11/2019  Upper GI series 03/13/2019  Antimicrobials:   None   Subjective: Patient in the room  with family at bedside.  Patient with some complaints of constipation states has not had a bowel movement in over 4 days.  States she feels a little bit better than on admission.  Patient states with upper GI did not have significant vomiting however did spit up a little bit.   Objective: Vitals:   03/12/19 1231 03/12/19 1827 03/13/19 0014 03/13/19 0602  BP: 126/83 115/64 122/72 123/72  Pulse: 63 67 66 63  Resp: _0 Temp: 98.3 F (36.8 C) 98.9 F (37.2 C) 98.1 F (36.7 C) 97.8 F (36.6 C)  TempSrc: Oral Oral Oral Oral  SpO2: 100% 99% 100% 100%  Weight:      Height:        Intake/Output Summary (Last 24 hours) at 03/13/2019 1144 Last data filed at 03/13/2019 0742 Gross per 24 hour  Intake 308.86 ml  Output --  Net 308.86 ml   Filed Weights   03/11/19 1117  Weight: 98 kg    Examination:  General exam: NAD Respiratory system: CTAB.  No wheezes, no crackles, no rhonchi.  Normal respiratory effort.  Cardiovascular system: Regular rate and rhythm no murmurs rubs or gallops.  No JVD.  No lower extremity edema. Gastrointestinal system: Abdomen is soft, some tenderness to palpation in the epigastric and upper abdomen.  Nondistended, no rebound, no guarding, positive bowel sounds.  Central nervous system: Alert and oriented. No focal neurological deficits. Extremities: Symmetric 5 x 5 power. Skin: No rashes, lesions or ulcers Psychiatry: Judgement and insight appear normal. Mood & affect appropriate.     Data Reviewed: I have personally reviewed following labs and imaging studies  CBC: Recent Labs  Lab 03/11/19 1130 03/11/19 1459 03/12/19 0514 03/13/19 0123  WBC 5.7 5.3 4.4 4.2  NEUTROABS 4.7  --   --  3.1  HGB 12.9 12.3 12.2 11.2*  HCT 41.3 39.4 38.7 36.1  MCV 92.8 92.1 92.1 91.6  PLT 329 318 298 578   Basic Metabolic Panel: Recent Labs  Lab 03/11/19 1130 03/11/19 1459 03/12/19 0514 03/13/19 0123  NA 141  --  140 139  K 3.7  --  3.5 3.6  CL 103  --   106 109  CO2 25  --  24 24  GLUCOSE 92  --  95 103*  BUN 6  --  7 6  CREATININE 0.80 0.71 0.77 0.80  CALCIUM 9.2  --  8.8* 8.3*  MG  --   --   --  1.7   GFR: Estimated Creatinine Clearance: 91 mL/min (by C-G formula based on SCr of 0.8 mg/dL). Liver Function Tests: Recent Labs  Lab 03/11/19 1130  AST 20  ALT 16  ALKPHOS 66  BILITOT 1.3*  PROT 7.9  ALBUMIN 3.9   Recent Labs  Lab 03/11/19 1130  LIPASE 23   No results for input(s): AMMONIA in the last 168 hours. Coagulation Profile: No results for input(s): INR, PROTIME in the last 168 hours. Cardiac Enzymes: No results for input(s): CKTOTAL, CKMB, CKMBINDEX, TROPONINI in the last 168  hours. BNP (last 3 results) No results for input(s): PROBNP in the last 8760 hours. HbA1C: No results for input(s): HGBA1C in the last 72 hours. CBG: Recent Labs  Lab 03/11/19 1502 03/11/19 1534 03/12/19 0601 03/13/19 0559  GLUCAP 65* 119* 93 105*   Lipid Profile: No results for input(s): CHOL, HDL, LDLCALC, TRIG, CHOLHDL, LDLDIRECT in the last 72 hours. Thyroid Function Tests: No results for input(s): TSH, T4TOTAL, FREET4, T3FREE, THYROIDAB in the last 72 hours. Anemia Panel: No results for input(s): VITAMINB12, FOLATE, FERRITIN, TIBC, IRON, RETICCTPCT in the last 72 hours. Sepsis Labs: No results for input(s): PROCALCITON, LATICACIDVEN in the last 168 hours.  Recent Results (from the past 240 hour(s))  SARS CORONAVIRUS 2 (TAT 6-24 HRS) Nasopharyngeal Nasopharyngeal Swab     Status: None   Collection Time: 03/11/19  1:00 PM   Specimen: Nasopharyngeal Swab  Result Value Ref Range Status   SARS Coronavirus 2 NEGATIVE NEGATIVE Final    Comment: (NOTE) SARS-CoV-2 target nucleic acids are NOT DETECTED. The SARS-CoV-2 RNA is generally detectable in upper and lower respiratory specimens during the acute phase of infection. Negative results do not preclude SARS-CoV-2 infection, do not rule out co-infections with other pathogens, and  should not be used as the sole basis for treatment or other patient management decisions. Negative results must be combined with clinical observations, patient history, and epidemiological information. The expected result is Negative. Fact Sheet for Patients: SugarRoll.be Fact Sheet for Healthcare Providers: https://www.woods-mathews.com/ This test is not yet approved or cleared by the [place] FDA and  has been authorized for detection and/or diagnosis of SARS-CoV-2 by FDA under an Emergency Use Authorization (EUA). This EUA will remain  in effect (meaning this test can be used) for the duration of the COVID-19 declaration under Section 56 4(b)(1) of the Act, 21 U.S.C. section 360bbb-3(b)(1), unless the authorization is terminated or revoked sooner. Performed at [facility], [place] [address] [place], [place] [address]          Radiology Studies: DG Abdomen Acute W/Chest  Result Date: 03/11/2019 CLINICAL DATA:  Epigastric pain.  History of peptic ulcer disease. EXAM: DG ABDOMEN ACUTE W/ 1V CHEST COMPARISON:  Chest x-ray 08/19/2018 FINDINGS: Lungs are adequately inflated without airspace consolidation or effusion. Cardiomediastinal silhouette and remainder of the chest is unchanged. Abdominopelvic images demonstrate a nonspecific, nonobstructive bowel gas pattern with several air-filled nondilated small bowel loops. Air is present throughout the colon. There is no free peritoneal air or significant air-fluid levels. Mild degenerative change of the spine. Several pelvic phleboliths are present. IMPRESSION: Nonobstructive bowel gas pattern. No acute cardiopulmonary disease. Electronically Signed   By: Marin Olp M.D.   On: 03/11/2019 12:20   DG UGI W DOUBLE CM (HD BA)  Result Date: 03/13/2019 CLINICAL DATA:  Vomiting. EXAM: UPPER GI SERIES WITH KUB TECHNIQUE: After obtaining a scout radiograph a routine upper GI series was performed  using thin and high density barium. FLUOROSCOPY TIME:  Fluoroscopy Time:  1 minutes 24 seconds Radiation Exposure Index (if provided by the fluoroscopic device): 5 Number of Acquired Spot Images: 0 COMPARISON:  None. FINDINGS: Scout view of the abdomen shows a normal bowel gas pattern. Double contrast examination of the upper gastrointestinal tract shows mildly sluggish esophageal motility. No esophageal fold thickening, stricture or obstruction. Stomach is unremarkable. Duodenal bulb is largely obscured by the distal stomach, limiting evaluation. IMPRESSION: 1. Mildly sluggish esophageal motility. 2. Examination duodenum is limited.  Otherwise unremarkable exam. Electronically Signed  By: Lorin Picket M.D.   On: 03/13/2019 08:50        Scheduled Meds: . dexamethasone (DECADRON) injection  1 mg Intravenous Q24H  . enoxaparin (LOVENOX) injection  40 mg Subcutaneous Q24H  . folic acid  1 mg Intravenous Daily  . pantoprazole (PROTONIX) IV  40 mg Intravenous Q12H   Continuous Infusions: . dextrose 5 % and 0.9 % NaCl with KCl 40 mEq/L 100 mL/hr at 03/12/19 1248  . magnesium sulfate bolus IVPB 4 g (03/13/19 1037)     LOS: 1 day    Time spent: 35 minutes    Irine Seal, MD [facility]   To contact the attending provider between 7A-7P or the covering provider during after hours 7P-7A, please log into the web site [email or web address] and access using universal [place] password for that web site. If you do not have the password, please call the hospital operator.  03/13/2019, 11:44 AM

## 2019-03-13 NOTE — Progress Notes (Addendum)
   Patient Name: Patricia Reed Date of Encounter: 03/13/2019, 10:30 AM    Assessment and Plan  Persistent vomiting Epigastric pain RA on MTX and Xeljanz Fibromyalgia Constipation Situational anxiety   Cause of sxs that led to admission not clear but w/u reassuring. Will try clear liquids Amlodipine might lead to some constipation but do not think that or benazapril caused her sxs She has had episodes like this x years, about 1x/year  She does not have a hx migraines but the pattern is somewhat like cyclic vomiting syndrome but onset seems more gradual - started with epigastric pain x weeks 'like ulcers in past"  ? If somehow linked to autoimmune dz  Will see how she does with clear liquids Do not think she needs a laxative but may   Will f/u tomorrow    Iva Boop, MD, [facility] 03/13/2019 10:30 AM    Subjective  Same, ? Better as did not vomit severely with upper GI but dis "spit up some" Still w/ epigastric pain/soreness Says she has been stressed about work - Covid - in, out ? If I will have job  Last [facility] Thursday and was hard  Objective  BP 123/72 (BP Location: Left Arm)   Pulse 63   Temp 97.8 F (36.6 C) (Oral)   Resp 17   Ht 5\' 5"  (1.651 m)   Wt 98 kg   SpO2 100%   BMI 35.94 kg/m  NAD abd is soft, BS+ mild-mod tender epigastrium and some LLQ  Labs reviewed - unremarkable - lipase was NL, CBC sl anemia, NL WBC, bili 1.3  UGI - no hiatal hernia  EGD yesterday ? HH - no ulcers, inflammation

## 2019-03-14 DIAGNOSIS — R112 Nausea with vomiting, unspecified: Secondary | ICD-10-CM

## 2019-03-14 LAB — BASIC METABOLIC PANEL
Anion gap: 8 (ref 5–15)
BUN: <5 mg/dL — ABNORMAL LOW (ref 6–20)
CO2: 23 mmol/L (ref 22–32)
Calcium: 8.8 mg/dL — ABNORMAL LOW (ref 8.9–10.3)
Chloride: 108 mmol/L (ref 98–111)
Creatinine, Ser: 0.71 mg/dL (ref 0.44–1.00)
GFR calc Af Amer: >60 mL/min (ref >60)
GFR calc non Af Amer: >60 mL/min (ref >60)
Glucose, Bld: 129 mg/dL — ABNORMAL HIGH (ref 70–99)
Potassium: 4.4 mmol/L (ref 3.5–5.1)
Sodium: 139 mmol/L (ref 135–145)

## 2019-03-14 LAB — GLUCOSE, CAPILLARY: Glucose-Capillary: 111 mg/dL — ABNORMAL HIGH (ref 70–99)

## 2019-03-14 LAB — MAGNESIUM: Magnesium: 2.2 mg/dL (ref 1.7–2.4)

## 2019-03-14 MED ORDER — PANTOPRAZOLE SODIUM 40 MG PO TBEC: 40.0000 mg | DELAYED_RELEASE_TABLET | Freq: Two times a day (BID) | ORAL | Status: DC

## 2019-03-14 MED ORDER — SENNOSIDES-DOCUSATE SODIUM 8.6-50 MG PO TABS: 1.0000 | ORAL_TABLET | Freq: Two times a day (BID) | ORAL | Status: DC

## 2019-03-14 NOTE — Discharge Summary (Signed)
Physician Discharge Summary  Patricia Reed YHC:[id number] DOB: Sep 01, 1960 DOA: 03/11/2019  PCP: [place], [name], MD  Admit date: 03/11/2019 Discharge date: 03/14/2019  Time spent: 55 minutes  Recommendations for Outpatient Follow-up:  1. Follow-up with [place], [name], MD in 2 weeks. On follow-up patient will need a basic metabolic profile done to follow-up on electrolytes and renal function. Patient's and nausea and emesis will need to be followed up upon and patient may follow-up with GI in the outpatient setting as needed.   Discharge Diagnoses:  Principal Problem:   Nausea & vomiting Active Problems:   GERD   Mild intermittent asthma   History of stomach ulcers   HTN (hypertension)   Autoimmune disease (HCC)   Fibromyalgia   Persistent vomiting   Abdominal pain, epigastric   Discharge Condition: Stable and improved  Diet recommendation: Heart healthy  Filed Weights   03/11/19 1117  Weight: 98 kg    History of present illness:  Per Dr Maude Leriche is a 59 y.o. female with medical history significant of rheumatoid arthritis, lupus, fibromyalgia, elevated blood pressure, HLD, GERD, h/o gastric ulcer, asthma who presented for 2 weeks of worsening abdominal pain, nausea and 2 days of vomiting progressing to inability to keep anything down.  She noted that she went to her GI doctor about 2 weeks ago due to concern for black stools.  She was checked and no blood was found.  She has not seen this since.  At the time, the provider advised BID dosing of her protonix which she has been doing.  She has 10/10 epigastric abdominal pain, nausea and regurgitation of all food and liquids since yesterday.  Pain was like a punch in the stomach.  No fever, no chills.  Her body feels restless and she is not able to sleep.  She has had harder stools.  She has had no hematemesis or chest pain.  She is curious if this could have occurred due to starting amlodipine and benazapril 1  month ago.  She feels that since that time her symptoms have slowly been escalating and worsening.  She was not previously on anti-hypertensives and notes her PCP was concerned for stroke her BP was so high.  She has not taken her medications for 2 days and notes that her BP has been doing okay.  No headache or vision changes.   ED Course: In the ED, she had blood work which is normal.  She has tried to drink a bit of water without success.  Bentyl IV helped her symptoms and she has been able to rest.  Dr. Elnoria Howard was contacted and will plan for an EGD tomorrow morning.   Hospital Course:  1 nausea/vomiting/decreased oral intake/abdominal pain/GERD Patient with history of peptic ulcer disease.  Patient on admission was made n.p.o. due to presentation with nausea and vomiting and decreased oral intake with inability to keep anything down. GI was consulted. Patient subsequently underwent upper endoscopy on 03/12/2019 per Dr. Elnoria Howard which showed large hiatal hernia, normal stomach.  GI recommended upper GI series with KUB, which was done, 03/13/2019 which showed mildly sluggish esophageal motility, otherwise unremarkable exam, no esophageal fold thickening, no stricture or obstruction.  GI recommending trial of clear liquids which patient tolerated. Diet was subsequently advanced to a soft diet which he tolerated. Patient initially was on IV PPI and subsequently transition to oral PPI. Patient remained in stable condition and patient was cleared by gastroenterology for discharge. Outpatient follow-up with PCP.  2.  Dehydration Patient hydrated IV fluids and was euvolemic by day of discharge.  3.  Hypertension Patient stated relatively new diagnosis and felt her symptoms worsened on those medications.  Blood pressure remained stable off patient's antihypertensive medications. Patient was reluctant to be started back on antihypertensive medications and as such will be discharged home off the antihypertensive  medications until follow-up with PCP. Patient's blood pressure on day of discharge was 127/85.   4.  Autoimmune disease/fibromyalgia/rheumatoid arthritis Patient noted to be on Morrie Sheldon which was held during the hospitalization as well as methotrexate. Patient maintained on folate 1 mg daily.  Patient's oral prednisone was changed to IV dexamethasone during the hospitalization and baclofen held. Patient's oral prednisone will be resumed on discharge as well as baclofen. Outpatient follow-up.  5.  Constipation Dulcolax suppository x1 and soapsuds enema however with no significant results. Patient subsequently drank some prune juice and had some bowel movements. Outpatient follow-up..   Procedures:  Upper endoscopy 03/12/2019  Acute abdominal series 03/11/2019  Upper GI series 03/13/2019  Consultations:  Gastroenterology: Dr [name]  Discharge Exam: Vitals:   03/14/19 0553 03/14/19 1155  BP: 115/73 127/85  Pulse: 66 83  Resp: 18 16  Temp: 97.7 F (36.5 C) 97.8 F (36.6 C)  SpO2: 98% 100%    General: NAD Cardiovascular: RRR Respiratory: CTAB  Discharge Instructions   Discharge Instructions    Diet - low sodium heart healthy   Complete by: As directed    Increase activity slowly   Complete by: As directed      Allergies as of 03/14/2019      Reactions   Aspirin Other (See Comments)   GI bleed   Demerol [meperidine]    Hydrocodone-acetaminophen Hives, Nausea And Vomiting   Ibuprofen Other (See Comments)   GI bleed   Imdur [isosorbide Nitrate] Other (See Comments)   Reaction unknown   Meperidine Hcl Hives, Nausea And Vomiting   Short term memory loss   Morphine Hives, Nausea And Vomiting   Oxycodone-acetaminophen Hives, Nausea And Vomiting   Reaction unknown   Procaine Hcl Other (See Comments)   Ineffective   Toprol Xl [metoprolol] Other (See Comments)   Reaction unknown   Tramadol Itching      Medication List    STOP taking these medications   amLODipine  10 MG tablet Commonly known as: NORVASC   benazepril 10 MG tablet Commonly known as: LOTENSIN     TAKE these medications   acetaminophen 650 MG CR tablet Commonly known as: TYLENOL Take 650 mg by mouth every 8 (eight) hours as needed for pain.   albuterol 108 (90 Base) MCG/ACT inhaler Commonly known as: VENTOLIN HFA Inhale 2 puffs into the lungs every 6 (six) hours as needed for wheezing or shortness of breath.   ALLERGY SYRINGE 1CC/27GX1/2" 27G X 1/2" 1 ML Misc Patient to use to inject MTX weekly   aspirin 81 MG tablet Take 81 mg by mouth daily.   B-D INS SYR ULTRAFINE 1CC/30G 30G X 1/2" 1 ML Misc Generic drug: Insulin Syringe-Needle U-100   baclofen 10 MG tablet Commonly known as: LIORESAL TAKE 1 TABLET BY MOUTH AT BEDTIME AS NEEDED FOR MUSCLE SPASMS What changed: See the new instructions.   diclofenac sodium 1 % Gel Commonly known as: VOLTAREN Apply 3 g to 3 large joints up to 3 times daily. What changed:   how much to take  how to take this  when to take this  reasons to take this  additional instructions   folic acid 1 MG tablet Commonly known as: FOLVITE TAKE 2 TABLETS (2 MG TOTAL) BY MOUTH DAILY. What changed:   how much to take  when to take this   methotrexate 50 MG/2ML injection Inject 0.6 mLs (15 mg total) into the skin once a week. What changed: when to take this   pantoprazole 40 MG tablet Commonly known as: PROTONIX Take 1 tablet (40 mg total) by mouth 2 (two) times daily.   predniSONE 5 MG tablet Commonly known as: DELTASONE Take 2.5 tablets by mouth daily.   valACYclovir 500 MG tablet Commonly known as: VALTREX TAKE 1 TABLET (500 MG TOTAL) BY MOUTH 2 (TWO) TIMES DAILY. FOR 3 DAYS WITH ACUTE EPISODES.   vitamin B-12 100 MCG tablet Commonly known as: CYANOCOBALAMIN Take 100 mcg by mouth daily.   VITAMIN D (ERGOCALCIFEROL) PO Take 1 capsule by mouth daily.   Xeljanz XR 11 MG Tb24 Generic drug: Tofacitinib Citrate ER Take 1  tablet by mouth daily.      Allergies  Allergen Reactions  . Aspirin Other (See Comments)    GI bleed  . Demerol [Meperidine]   . Hydrocodone-Acetaminophen Hives and Nausea And Vomiting  . Ibuprofen Other (See Comments)    GI bleed  . Imdur [Isosorbide Nitrate] Other (See Comments)    Reaction unknown  . Meperidine Hcl Hives and Nausea And Vomiting    Short term memory loss  . Morphine Hives and Nausea And Vomiting  . Oxycodone-Acetaminophen Hives and Nausea And Vomiting    Reaction unknown  . Procaine Hcl Other (See Comments)    Ineffective  . Toprol Xl [Metoprolol] Other (See Comments)    Reaction unknown  . Tramadol Itching   Follow-up Information    [place], [name], MD. Schedule an appointment as soon as possible for a visit in 2 week(s).   Specialty: Family Medicine Contact information: [address] [place] [address] [phone]        Rollene Rotunda, MD .   Specialty: Cardiology Contact information: [address] [place] [address] [phone]            The results of significant diagnostics from this hospitalization (including imaging, microbiology, ancillary and laboratory) are listed below for reference.    Significant Diagnostic Studies: US Abdomen Complete  Result Date: 02/28/2019 CLINICAL DATA:  Epigastric pain and nausea EXAM: ABDOMEN ULTRASOUND COMPLETE COMPARISON:  None. FINDINGS: Gallbladder: No gallstones or wall thickening visualized. There is no pericholecystic fluid. No sonographic Murphy sign noted by sonographer. Common bile duct: Diameter: 4 mm. No intrahepatic, common hepatic, or common bile dilatation. Liver: There is a cyst in the right lobe of the liver measuring 1.9 x 1.8 x 2.0 cm. The liver echotexture overall is increased. Portal vein is patent on color Doppler imaging with normal direction of blood flow towards the liver. IVC: No abnormality visualized. Pancreas: There is no pancreatic mass or  inflammatory focus. Spleen: Size and appearance within normal limits. Right Kidney: Length: 11.4 cm. Echogenicity within normal limits. No mass or hydronephrosis visualized. Left Kidney: Length: 9.7 cm. Echogenicity within normal limits. No mass or hydronephrosis visualized. Abdominal aorta: No aneurysm visualized. Other findings: No demonstrable ascites. IMPRESSION: 1. Cyst noted in right lobe of liver. Liver echogenicity overall increased, a finding indicative of hepatic steatosis. 2.  Study otherwise unremarkable. Electronically Signed   By: Bretta Bang III M.D.   On: 02/28/2019 15:07   DG Abdomen Acute W/Chest  Result Date:  03/11/2019 CLINICAL DATA:  Epigastric pain.  History of peptic ulcer disease. EXAM: DG ABDOMEN ACUTE W/ 1V CHEST COMPARISON:  Chest x-ray 08/19/2018 FINDINGS: Lungs are adequately inflated without airspace consolidation or effusion. Cardiomediastinal silhouette and remainder of the chest is unchanged. Abdominopelvic images demonstrate a nonspecific, nonobstructive bowel gas pattern with several air-filled nondilated small bowel loops. Air is present throughout the colon. There is no free peritoneal air or significant air-fluid levels. Mild degenerative change of the spine. Several pelvic phleboliths are present. IMPRESSION: Nonobstructive bowel gas pattern. No acute cardiopulmonary disease. Electronically Signed   By: Elberta Fortis M.D.   On: 03/11/2019 12:20   DG UGI W DOUBLE CM (HD BA)  Result Date: 03/13/2019 CLINICAL DATA:  Vomiting. EXAM: UPPER GI SERIES WITH KUB TECHNIQUE: After obtaining a scout radiograph a routine upper GI series was performed using thin and high density barium. FLUOROSCOPY TIME:  Fluoroscopy Time:  1 minutes 24 seconds Radiation Exposure Index (if provided by the fluoroscopic device): 5 Number of Acquired Spot Images: 0 COMPARISON:  None. FINDINGS: Scout view of the abdomen shows a normal bowel gas pattern. Double contrast examination of the upper  gastrointestinal tract shows mildly sluggish esophageal motility. No esophageal fold thickening, stricture or obstruction. Stomach is unremarkable. Duodenal bulb is largely obscured by the distal stomach, limiting evaluation. IMPRESSION: 1. Mildly sluggish esophageal motility. 2. Examination duodenum is limited.  Otherwise unremarkable exam. Electronically Signed   By: Leanna Battles M.D.   On: 03/13/2019 08:50    Microbiology: Recent Results (from the past 240 hour(s))  SARS CORONAVIRUS 2 (TAT 6-24 HRS) Nasopharyngeal Nasopharyngeal Swab     Status: None   Collection Time: 03/11/19  1:00 PM   Specimen: Nasopharyngeal Swab  Result Value Ref Range Status   SARS Coronavirus 2 NEGATIVE NEGATIVE Final    Comment: (NOTE) SARS-CoV-2 target nucleic acids are NOT DETECTED. The SARS-CoV-2 RNA is generally detectable in upper and lower respiratory specimens during the acute phase of infection. Negative results do not preclude SARS-CoV-2 infection, do not rule out co-infections with other pathogens, and should not be used as the sole basis for treatment or other patient management decisions. Negative results must be combined with clinical observations, patient history, and epidemiological information. The expected result is Negative. Fact Sheet for Patients: HairSlick.no Fact Sheet for Healthcare Providers: quierodirigir.com This test is not yet approved or cleared by the [place] FDA and  has been authorized for detection and/or diagnosis of SARS-CoV-2 by FDA under an Emergency Use Authorization (EUA). This EUA will remain  in effect (meaning this test can be used) for the duration of the COVID-19 declaration under Section 56 4(b)(1) of the Act, 21 U.S.C. section 360bbb-3(b)(1), unless the authorization is terminated or revoked sooner. Performed at [facility], [address], [place], [place] [address]      Labs: Basic  Metabolic Panel: Recent Labs  Lab 03/11/19 1130 03/11/19 1459 03/12/19 0514 03/13/19 0123 03/14/19 0437  NA 141  --  140 139 139  K 3.7  --  3.5 3.6 4.4  CL 103  --  106 109 108  CO2 25  --  24 24 23   GLUCOSE 92  --  95 103* 129*  BUN 6  --  7 6 <5*  CREATININE 0.80 0.71 0.77 0.80 0.71  CALCIUM 9.2  --  8.8* 8.3* 8.8*  MG  --   --   --  1.7 2.2   Liver Function Tests: Recent Labs  Lab  03/11/19 1130  AST 20  ALT 16  ALKPHOS 66  BILITOT 1.3*  PROT 7.9  ALBUMIN 3.9   Recent Labs  Lab 03/11/19 1130  LIPASE 23   No results for input(s): AMMONIA in the last 168 hours. CBC: Recent Labs  Lab 03/11/19 1130 03/11/19 1459 03/12/19 0514 03/13/19 0123  WBC 5.7 5.3 4.4 4.2  NEUTROABS 4.7  --   --  3.1  HGB 12.9 12.3 12.2 11.2*  HCT 41.3 39.4 38.7 36.1  MCV 92.8 92.1 92.1 91.6  PLT 329 318 298 276   Cardiac Enzymes: No results for input(s): CKTOTAL, CKMB, CKMBINDEX, TROPONINI in the last 168 hours. BNP: BNP (last 3 results) No results for input(s): BNP in the last 8760 hours.  ProBNP (last 3 results) No results for input(s): PROBNP in the last 8760 hours.  CBG: Recent Labs  Lab 03/11/19 1502 03/11/19 1534 03/12/19 0601 03/13/19 0559 03/14/19 0611  GLUCAP 65* 119* 93 105* 111*       Signed:  Ramiro Harvest MD.  [facility] 03/14/2019, 4:53 PM

## 2019-03-14 NOTE — Progress Notes (Signed)
Daily Rounding Note  03/14/2019, 9:09 AM  LOS: 2 days   GI MD is Dr Loletha Carrow.    SUBJECTIVE:   Chief complaint: Nonbloody nausea, vomiting, constipation.     Patient feels a lot better.  She was unable to have a bowel movement after an enema and oral laxative yesterday.  She then drank a large glass of prune juice last night and again this morning.  This morning she has had 2 watery, small to moderate sized bowel movements and is passing flatus.  Abdominal pain resolved.  Tolerating clear liquids and actually has an appetite. Vitals stable.  No fevers.  OBJECTIVE:         Vital signs in last 24 hours:    Temp:  [97.7 F (36.5 C)-97.9 F (36.6 C)] 97.7 F (36.5 C) (02/23 0553) Pulse Rate:  [66-79] 66 (02/23 0553) Resp:  [16-18] 18 (02/23 0553) BP: (112-128)/(73-86) 115/73 (02/23 0553) SpO2:  [98 %-99 %] 98 % (02/23 0553) Last BM Date: 03/12/19 Filed Weights   03/11/19 1117  Weight: 98 kg   General: Looks well.  Seen walking out of the bathroom to the bed without any issues. Heart: RRR Chest: Clear bilaterally.  No labored breathing or cough. Abdomen: Soft.  Minimal nonfocal tenderness in the upper abdomen.  Active bowel sounds.  Not distended. Extremities: CCE. No changes of rheumatoid arthritis in her hands. Neuro/Psych: Pleasant, calm, fluid speech.  Bright affect.  Intake/Output from previous day: 02/22 0701 - 02/23 0700 In: 1999.8 [P.O.:480; I.V.:1519.8] Out: -   Intake/Output this shift: Total I/O In: 360 [P.O.:360] Out: -   Lab Results: Recent Labs    03/11/19 1459 03/12/19 0514 03/13/19 0123  WBC 5.3 4.4 4.2  HGB 12.3 12.2 11.2*  HCT 39.4 38.7 36.1  PLT 318 298 276   BMET Recent Labs    03/12/19 0514 03/13/19 0123 03/14/19 0437  NA 140 139 139  K 3.5 3.6 4.4  CL 106 109 108  CO2 24 24 23   GLUCOSE 95 103* 129*  BUN 7 6 <5*  CREATININE 0.77 0.80 0.71  CALCIUM 8.8* 8.3* 8.8*    LFT Recent Labs    03/11/19 1130  PROT 7.9  ALBUMIN 3.9  AST 20  ALT 16  ALKPHOS 66  BILITOT 1.3*   PT/INR No results for input(s): LABPROT, INR in the last 72 hours. Hepatitis Panel No results for input(s): HEPBSAG, HCVAB, HEPAIGM, HEPBIGM in the last 72 hours.  Studies/Results: DG UGI W DOUBLE CM (HD BA)  Result Date: 03/13/2019 CLINICAL DATA:  Vomiting. EXAM: UPPER GI SERIES WITH KUB TECHNIQUE: After obtaining a scout radiograph a routine upper GI series was performed using thin and high density barium. FLUOROSCOPY TIME:  Fluoroscopy Time:  1 minutes 24 seconds Radiation Exposure Index (if provided by the fluoroscopic device): 5 Number of Acquired Spot Images: 0 COMPARISON:  None. FINDINGS: Scout view of the abdomen shows a normal bowel gas pattern. Double contrast examination of the upper gastrointestinal tract shows mildly sluggish esophageal motility. No esophageal fold thickening, stricture or obstruction. Stomach is unremarkable. Duodenal bulb is largely obscured by the distal stomach, limiting evaluation. IMPRESSION: 1. Mildly sluggish esophageal motility. 2. Examination duodenum is limited.  Otherwise unremarkable exam. Electronically Signed   By: Lorin Picket M.D.   On: 03/13/2019 08:50   Scheduled Meds: . dexamethasone (DECADRON) injection  1 mg Intravenous Q24H  . enoxaparin (LOVENOX) injection  40 mg Subcutaneous Q24H  .  folic acid  1 mg Intravenous Daily  . pantoprazole (PROTONIX) IV  40 mg Intravenous Q12H   Continuous Infusions: PRN Meds:.acetaminophen **OR** acetaminophen, albuterol, diclofenac sodium, dicyclomine, labetalol, ondansetron **OR** ondansetron (ZOFRAN) IV, sorbitol   ASSESMENT:   *   Cyclic nausea, vomiting, epigastric pain, constipation. Protonix dose upped to bid on 2/9.  Cause of symptoms not clear but work-up thus far reassuring.  ? sxs related to autoimmune dz?  02/28/2019 abdominal ultrasound with hepatic steatosis and cyst in right lobe of  liver. 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  O/w unremarkable study. CMET, Lipase, WBCs nml.   BID IV Protonix day 4 Symptoms significantly improved from yesterday.  *   RA.  Home meds include Xeljanz, methotrexate, low-dose prednisone 2.5 mg daily.  Currently on IV dexamethasone.   PLAN   *    Advance to soft diet. Switch from IV to oral Protonix 40 bid.   Could probably stop Decadron and reinitiate oral prednisone but will defer this decision to hospitalist, Dr. Janee Morn.    Patricia Reed  03/14/2019, 9:09 AM Phone [phone]

## 2019-03-15 ENCOUNTER — Telehealth: Payer: Self-pay | Admitting: *Deleted

## 2019-03-15 NOTE — Progress Notes (Signed)
____________________________________________________________  Attending physician addendum:  Thank you for sending this case to me. I have reviewed the entire note, and the outlined plan seems appropriate.  I suspect this is more likely side effect of her medicine regimen rather than true ulcer, as we had similar symptoms last year with normal upper endoscopy. Agree with the ultrasound to rule out gallstones, though this does not have a typical sign of biliary colic. Intermittent dark stool stool, but not clear if that is truly bleeding.  Agree with the work-up and treatment you suggested.  No EGD planned at this point.  Amada Jupiter, MD  ____________________________________________________________

## 2019-03-15 NOTE — Telephone Encounter (Signed)
Attempted to contact patient for TCM. No answer and voicemail is full so unable to leave a message. Patient has [employer] so TCM is not needed from our office nothing further needed.

## 2019-03-16 ENCOUNTER — Other Ambulatory Visit: Payer: Self-pay | Admitting: Family Medicine

## 2019-03-16 DIAGNOSIS — R519 Headache, unspecified: Secondary | ICD-10-CM

## 2019-03-17 ENCOUNTER — Other Ambulatory Visit: Payer: Self-pay | Admitting: Rheumatology

## 2019-03-17 NOTE — Telephone Encounter (Signed)
Last Visit: 11/10/2018 Next Visit: 04/13/2019 Labs: 03/13/19 Hgb 11.2 Glucose 103 Calcium 8.3  Okay to refill per Dr. Corliss Skains

## 2019-03-18 ENCOUNTER — Ambulatory Visit: Payer: BC Managed Care – PPO

## 2019-03-20 ENCOUNTER — Ambulatory Visit: Payer: BC Managed Care – PPO | Attending: Internal Medicine

## 2019-03-20 DIAGNOSIS — Z23 Encounter for immunization: Secondary | ICD-10-CM

## 2019-03-20 NOTE — Progress Notes (Signed)
   Covid-19 Vaccination Clinic  Name:  Patricia Reed    MRN: [id number] DOB: 31-Mar-1960  03/20/2019  Patricia Reed was observed post Covid-19 immunization for 15 minutes without incidence. She was provided with Vaccine Information Sheet and instruction to access the V-Safe system.   Patricia Reed was instructed to call 911 with any severe reactions post vaccine: Marland Kitchen Difficulty breathing  . Swelling of your face and throat  . A fast heartbeat  . A bad rash all over your body  . Dizziness and weakness    Immunizations Administered    Name Date Dose VIS Date Route   Pfizer COVID-19 Vaccine 03/20/2019  2:29 PM 0.3 mL 12/30/2018 Intramuscular   Manufacturer: [employer], [employer]   Lot: [id number]   NDC: [address]-5

## 2019-03-22 ENCOUNTER — Other Ambulatory Visit: Payer: Self-pay | Admitting: Family Medicine

## 2019-03-22 DIAGNOSIS — K2940 Chronic atrophic gastritis without bleeding: Secondary | ICD-10-CM

## 2019-03-22 DIAGNOSIS — R1013 Epigastric pain: Secondary | ICD-10-CM

## 2019-03-27 ENCOUNTER — Telehealth (INDEPENDENT_AMBULATORY_CARE_PROVIDER_SITE_OTHER): Payer: BC Managed Care – PPO | Admitting: Family Medicine

## 2019-03-27 ENCOUNTER — Ambulatory Visit: Payer: BC Managed Care – PPO | Attending: Internal Medicine

## 2019-03-27 DIAGNOSIS — Z7189 Other specified counseling: Secondary | ICD-10-CM

## 2019-03-27 DIAGNOSIS — R52 Pain, unspecified: Secondary | ICD-10-CM

## 2019-03-27 DIAGNOSIS — R112 Nausea with vomiting, unspecified: Secondary | ICD-10-CM | POA: Diagnosis not present

## 2019-03-27 DIAGNOSIS — M359 Systemic involvement of connective tissue, unspecified: Secondary | ICD-10-CM

## 2019-03-27 DIAGNOSIS — K449 Diaphragmatic hernia without obstruction or gangrene: Secondary | ICD-10-CM

## 2019-03-27 DIAGNOSIS — Z20822 Contact with and (suspected) exposure to covid-19: Secondary | ICD-10-CM

## 2019-03-27 MED ORDER — ONDANSETRON HCL 4 MG PO TABS: 4.0000 mg | ORAL_TABLET | Freq: Three times a day (TID) | ORAL | 0 refills | Status: DC | PRN

## 2019-03-27 NOTE — Progress Notes (Signed)
Virtual Visit via Video Note  I connected with Patricia Reed on 03/27/19 at  9:00 AM EST by a video enabled telemedicine application 2/2 COVID-19 pandemic and verified that I am speaking with the correct person using two identifiers.  Location patient: home Location provider:work or home office Persons participating in the virtual visit: patient, provider  I discussed the limitations of evaluation and management by telemedicine and the availability of in person appointments. The patient expressed understanding and agreed to proceed.   HPI: Pt is a 59 yo female with pmh sig for RA on MTX, GERD, lupus, gastritis, Dysphagia, HSV, anxiety, insomnia, mixed connective tissue dz, fibromyalgia followed by Dr. [place].  Pt seen for chills, n/v, rhinorrhea, body aches started at 3 am.  Denies fever, HAs, ear pain or pressure, ST, cough, diarrhea, sick contacts.  Has not taken anything for symptoms. Has a COVID test scheduled for 10 am.  Pt had the first dose of COVID-19 vaccine last Monday.    Pt has not taken methotrexate over a month.  She restarted it over the weekend.  Was scheduled to have f/u labs today with Rheumatology.  Pt hospitalized 2/20-2/23/21 for n/v. EGD on 03/12/19 showed a large hiatal hernia.  GI series with [facility] on 03/13/19 with mildly sluggish esophageal motility.  Pt states she has f/u scheduled with GI in the next few wks.  ROS: See pertinent positives and negatives per HPI.  Past Medical History:  Diagnosis Date  . Allergy   . Anemia   . Asthma   . Blood transfusion without reported diagnosis   . Fibromyalgia   . GERD (gastroesophageal reflux disease)   . Heart murmur   . High cholesterol   . History of stomach ulcers   . Hypertension   . Lupus (HCC)   . MI, old   . Rheumatoid arthritis (HCC)   . Thyroid disease   . Thyroid mass   . Vertigo     Past Surgical History:  Procedure Laterality Date  . ABDOMINAL HYSTERECTOMY     done for menorrhagia, ovaries remain   . CERVICAL DISCECTOMY     plates and screws in neck  . CESAREAN SECTION    . COLONOSCOPY    . ESOPHAGEAL MANOMETRY N/A 02/12/2014   Procedure: ESOPHAGEAL MANOMETRY (EM);  Surgeon: Louis Meckel, MD;  Location: [facility];  Service: Endoscopy;  Laterality: N/A;  . ESOPHAGOGASTRODUODENOSCOPY  03/04/2011   Procedure: ESOPHAGOGASTRODUODENOSCOPY (EGD);  Surgeon: Louis Meckel, MD;  Location: [facility];  Service: Endoscopy;  Laterality: N/A;  BOTOX Injection  . ESOPHAGOGASTRODUODENOSCOPY (EGD) WITH PROPOFOL N/A 03/12/2019   Procedure: ESOPHAGOGASTRODUODENOSCOPY (EGD) WITH PROPOFOL;  Surgeon: Jeani Hawking, MD;  Location: [facility] ENDOSCOPY;  Service: Endoscopy;  Laterality: N/A;  . exc benign breast lump    . TONSILLECTOMY    . UPPER GASTROINTESTINAL ENDOSCOPY      Family History  Problem Relation Age of Onset  . Alcohol abuse Father   . Hypertension Father   . Heart disease Father   . Other Mother        tobacco use disorder, refuses to see a doctor  . Colon cancer Maternal Uncle   . Diabetes Maternal Grandmother   . Colon polyps Neg Hx   . Esophageal cancer Neg Hx   . Kidney disease Neg Hx   . Stomach cancer Neg Hx   . Rectal cancer Neg Hx     SOCIAL HX: Works in the school system.  Unable to go back to work  without a COVID test.  States 3 teachers at her school tested positive, but she was not around them.   Current Outpatient Medications:  .  acetaminophen (TYLENOL) 650 MG CR tablet, Take 650 mg by mouth every 8 (eight) hours as needed for pain. , Disp: , Rfl:  .  albuterol (VENTOLIN HFA) 108 (90 Base) MCG/ACT inhaler, Inhale 2 puffs into the lungs every 6 (six) hours as needed for wheezing or shortness of breath., Disp: 6.7 g, Rfl: 1 .  aspirin 81 MG tablet, Take 81 mg by mouth daily., Disp: , Rfl:  .  B-D INS SYR ULTRAFINE 1CC/30G 30G X 1/2" 1 ML MISC, , Disp: , Rfl:  .  baclofen (LIORESAL) 10 MG tablet, TAKE 1 TABLET BY MOUTH AT BEDTIME AS NEEDED FOR MUSCLE SPASMS, Disp: 30  tablet, Rfl: 0 .  diclofenac sodium (VOLTAREN) 1 % GEL, Apply 3 g to 3 large joints up to 3 times daily. (Patient taking differently: Apply 2 g topically daily as needed (pain). ), Disp: 3 Tube, Rfl: 3 .  folic acid (FOLVITE) 1 MG tablet, TAKE 2 TABLETS (2 MG TOTAL) BY MOUTH DAILY. (Patient taking differently: Take 1 mg by mouth 2 (two) times daily. ), Disp: 180 tablet, Rfl: 3 .  methotrexate 50 MG/2ML injection, INJECT 0.6 MLS (15 MG TOTAL) INTO THE SKIN ONCE A WEEK., Disp: 8 mL, Rfl: 0 .  pantoprazole (PROTONIX) 40 MG tablet, TAKE 1 TABLET BY MOUTH EVERY DAY, Disp: 90 tablet, Rfl: 1 .  predniSONE (DELTASONE) 5 MG tablet, Take 2.5 tablets by mouth daily., Disp: , Rfl:  .  senna-docusate (SENOKOT-S) 8.6-50 MG tablet, Take 1 tablet by mouth 2 (two) times daily., Disp: , Rfl:  .  Tuberculin-Allergy Syringes (ALLERGY SYRINGE 1CC/27GX1/2") 27G X 1/2" 1 ML MISC, Patient to use to inject MTX weekly, Disp: 12 each, Rfl: 3 .  valACYclovir (VALTREX) 500 MG tablet, TAKE 1 TABLET (500 MG TOTAL) BY MOUTH 2 (TWO) TIMES DAILY. FOR 3 DAYS WITH ACUTE EPISODES., Disp: 18 tablet, Rfl: 0 .  vitamin B-12 (CYANOCOBALAMIN) 100 MCG tablet, Take 100 mcg by mouth daily., Disp: , Rfl:  .  VITAMIN D, ERGOCALCIFEROL, PO, Take 1 capsule by mouth daily., Disp: , Rfl:  .  XELJANZ XR 11 MG TB24, Take 1 tablet by mouth daily., Disp: , Rfl:   EXAM:  VITALS per patient if applicable: RR between 12-20 bpm  GENERAL: alert, oriented, appears well and in no acute distress  HEENT: atraumatic, conjunctiva clear, no obvious abnormalities on inspection of external nose and ears  NECK: normal movements of the head and neck  LUNGS: on inspection no signs of respiratory distress, breathing rate appears normal, no obvious gross SOB, gasping or wheezing  CV: no obvious cyanosis  MS: moves all visible extremities without noticeable abnormality  PSYCH/NEURO: pleasant and cooperative, no obvious depression or anxiety, speech and thought  processing grossly intact  ASSESSMENT AND PLAN:  Discussed the following assessment and plan:  Non-intractable vomiting with nausea, unspecified vomiting type  -causes included hiatal hernia, viral gastritis, common cold, influenza, cannot r/o COVID-19 infection. -advance diet as tolerated.  Discussed taking small sips of fluids, replacing electrolytes. -continue protonix 40 mg BID -COVID testing scheduled -self quarantine advised -has labs schedule with Rheumatology. -given precautions -f/u with GI - Plan: ondansetron (ZOFRAN) 4 MG tablet  Body aches -discussed possible causes including viral etiology -supportive care: Tylenol  Educated about COVID-19 virus infection -discussed s/s -agree with COVID testing.  Appt schedule for  today -self quarantine -given precuationis  Hiatal hernia -noted on EGD 03/12/19 -continue protonix 40 mg BID -f/u with GI  Autoimmune disease ([place]) -on immunosuppressants-  Restarted methotrexate injection -advised to contact Rheumatology office today as scheduled for labs -continue f/u with Rheumatology  F/u prn for continued or worsening symptoms  I discussed the assessment and treatment plan with the patient. The patient was provided an opportunity to ask questions and all were answered. The patient agreed with the plan and demonstrated an understanding of the instructions.   The patient was advised to call back or seek an in-person evaluation if the symptoms worsen or if the condition fails to improve as anticipated.   Billie Ruddy, MD

## 2019-03-28 ENCOUNTER — Telehealth: Payer: Self-pay | Admitting: Family Medicine

## 2019-03-28 ENCOUNTER — Telehealth: Payer: Self-pay | Admitting: Rheumatology

## 2019-03-28 LAB — NOVEL CORONAVIRUS, NAA: SARS-CoV-2, NAA: NOT DETECTED

## 2019-03-28 NOTE — Telephone Encounter (Signed)
#  1Patient has not heard anything about patient assistance program for this year. Does it need to be renewed? #2.Patient request for you to check to see if it is time to renew her Xeljanx? Patient came in for labs today, but had a COVID test yesterday due to some symptoms she is having, and could not get labs today.

## 2019-03-28 NOTE — Telephone Encounter (Signed)
Pt came in and dropped off form FMLA forms to be completed by the provider. Upon completion pt would like to be called at [phone] to pick form up. Placed in Dr. Elvis Coil folder.

## 2019-03-28 NOTE — Telephone Encounter (Signed)
Amber - Can you please check on patient assistance? Sue Lush - please check on patients Xeljanz RF.  Thank you.

## 2019-03-28 NOTE — Telephone Encounter (Signed)
Forms received.  

## 2019-03-29 ENCOUNTER — Encounter: Payer: Self-pay | Admitting: Family Medicine

## 2019-03-29 ENCOUNTER — Telehealth (INDEPENDENT_AMBULATORY_CARE_PROVIDER_SITE_OTHER): Payer: BC Managed Care – PPO | Admitting: Family Medicine

## 2019-03-29 VITALS — BP 153/94 | HR 86 | Ht 65.0 in

## 2019-03-29 DIAGNOSIS — M797 Fibromyalgia: Secondary | ICD-10-CM

## 2019-03-29 DIAGNOSIS — R112 Nausea with vomiting, unspecified: Secondary | ICD-10-CM | POA: Diagnosis not present

## 2019-03-29 DIAGNOSIS — I10 Essential (primary) hypertension: Secondary | ICD-10-CM | POA: Diagnosis not present

## 2019-03-29 DIAGNOSIS — J4520 Mild intermittent asthma, uncomplicated: Secondary | ICD-10-CM | POA: Diagnosis not present

## 2019-03-29 DIAGNOSIS — R5382 Chronic fatigue, unspecified: Secondary | ICD-10-CM

## 2019-03-29 DIAGNOSIS — K219 Gastro-esophageal reflux disease without esophagitis: Secondary | ICD-10-CM | POA: Diagnosis not present

## 2019-03-29 MED ORDER — FAMOTIDINE 20 MG PO TABS: 20.0000 mg | ORAL_TABLET | Freq: Every day | ORAL | 1 refills | Status: DC

## 2019-03-29 MED ORDER — HYDROCHLOROTHIAZIDE 25 MG PO TABS: 25.0000 mg | ORAL_TABLET | Freq: Every day | ORAL | 3 refills | Status: DC

## 2019-03-29 NOTE — Telephone Encounter (Signed)
Spoke with patient and advised patient she may go online to apply for a new card. Patient advised it looks as if she sees a doctor at [facility] and they are the ones who prescribe the [place]. Patient confirmed the doctor at [facility] does prescribe the [place]. Patient advised to contact them about the refill.

## 2019-03-29 NOTE — Assessment & Plan Note (Signed)
Possible etiology discussed. A few of her chronic medical problems could be contributing factors.

## 2019-03-29 NOTE — Assessment & Plan Note (Signed)
BP not well controlled. Possible complications of elevated BP discussed. She agrees with trying HCTZ 25 mg daily. Sinew monitoring BP regularly. Low salt diet also recommended. BMP in 2 weeks.

## 2019-03-29 NOTE — Progress Notes (Signed)
Virtual Visit via Video Note   I connected with Ms Rasmus on 03/29/19 by a video enabled telemedicine application and verified that I am speaking with the correct person using two identifiers.  Location patient: home Location provider:work office Persons participating in the virtual visit: patient, provider  I discussed the limitations of evaluation and management by telemedicine and the availability of in person appointments. The patient expressed understanding and agreed to proceed.   HPI: Ms Otani is a 60 yo female with hx of chronic fatigue,fibromyalgia,and inflammatory arthritis among some c/o fatigue,nausea,and vomiting. She is requesting FMLA so "my body can get some rest."  States that she has been hospitalized 2 times because nausea and vomiting. Discharged on 03/14/19. She went back to work for a week. Still feeling sick.  Intermittent epigastric abdominal pain,N/V.  She is on Protonix 40 mg daily. Today she has vomited once. Afraid of eating because it exacerbates symptoms. Negative for changes in bowel habits,melena,or urinary symptoms.  She is on Zofran. She was told to arrange appt with GI.  HTN: Amlodipine 5 mg was discontinued, thought it may be contributing for GI symptoms. She is not on antihypertensive medication. She is not checking BP at home. Negative for severe/frequent headache, visual changes, chest pain, palpitation, claudication, focal weakness, or edema.  Lab Results  Component Value Date   CREATININE 0.71 03/14/2019   BUN <5 (L) 03/14/2019   NA 139 03/14/2019   K 4.4 03/14/2019   CL 108 03/14/2019   CO2 23 03/14/2019   Lab Results  Component Value Date   WBC 4.2 03/13/2019   HGB 11.2 (L) 03/13/2019   HCT 36.1 03/13/2019   MCV 91.6 03/13/2019   PLT 276 03/13/2019   Also concerned about using Albuterol inh more frequent for the past few weeks, q 6 hours. She has not had asthma exacerbations in a while. Albuterol inh helps. + Occasional  cough with clear sputum.  + Wheezing and dyspnea exacerbated by exertion.  Fibromyalgia, she follows with rheuma. + Generalized myalgias and arthralgias. Asking if there is something she can take for fibromyalgia to resolved.  ROS: See pertinent positives and negatives per HPI.  Past Medical History:  Diagnosis Date  . Allergy   . Anemia   . Asthma   . Blood transfusion without reported diagnosis   . Fibromyalgia   . GERD (gastroesophageal reflux disease)   . Heart murmur   . High cholesterol   . History of stomach ulcers   . Hypertension   . Lupus (HCC)   . MI, old   . Rheumatoid arthritis (HCC)   . Thyroid disease   . Thyroid mass   . Vertigo     Past Surgical History:  Procedure Laterality Date  . ABDOMINAL HYSTERECTOMY     done for menorrhagia, ovaries remain  . CERVICAL DISCECTOMY     plates and screws in neck  . CESAREAN SECTION    . COLONOSCOPY    . ESOPHAGEAL MANOMETRY N/A 02/12/2014   Procedure: ESOPHAGEAL MANOMETRY (EM);  Surgeon: Louis Meckel, MD;  Location: [facility] END[facility];  Service: Endoscopy;  Laterality: N/A;  . ESOPHAGOGASTRODUODENOSCOPY  03/04/2011   Procedure: ESOPHAGOGASTRODUODENOSCOPY (EGD);  Surgeon: Louis Meckel, MD;  Location: [facility];  Service: Endoscopy;  Laterality: N/A;  BOTOX Injection  . ESOPHAGOGASTRODUODENOSCOPY (EGD) WITH PROPOFOL N/A 03/12/2019   Procedure: ESOPHAGOGASTRODUODENOSCOPY (EGD) WITH PROPOFOL;  Surgeon: Jeani Hawking, MD;  Location: [facility] ENDOSCOPY;  Service: Endoscopy;  Laterality: N/A;  . exc benign breast lump    .  TONSILLECTOMY    . UPPER GASTROINTESTINAL ENDOSCOPY      Family History  Problem Relation Age of Onset  . Alcohol abuse Father   . Hypertension Father   . Heart disease Father   . Other Mother        tobacco use disorder, refuses to see a doctor  . Colon cancer Maternal Uncle   . Diabetes Maternal Grandmother   . Colon polyps Neg Hx   . Esophageal cancer Neg Hx   . Kidney disease Neg Hx   .  Stomach cancer Neg Hx   . Rectal cancer Neg Hx     Social History   Socioeconomic History  . Marital status: Married    Spouse name: Not on file  . Number of children: 4  . Years of education: Not on file  . Highest education level: Not on file  Occupational History  . Occupation: Financial planner: [employer]  Tobacco Use  . Smoking status: Never Smoker  . Smokeless tobacco: Never Used  Substance and Sexual Activity  . Alcohol use: Yes    Alcohol/week: 1.0 standard drinks    Types: 1 Glasses of wine per week    Comment: occ  . Drug use: No  . Sexual activity: Yes    Birth control/protection: None, Surgical    Comment: LAVH  Other Topics Concern  . Not on file  Social History Narrative   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     Social Determinants of Health   Financial Resource Strain:   . Difficulty of Paying Living Expenses:   Food Insecurity:   . Worried About Charity fundraiser in the Last Year:   . Arboriculturist in the Last Year:   Transportation Needs:   . Film/video editor (Medical):   Marland Kitchen Lack of Transportation (Non-Medical):   Physical Activity:   . Days of Exercise per Week:   . Minutes of Exercise per Session:   Stress:   . Feeling of Stress :   Social Connections:   . Frequency of Communication with Friends and Family:   . Frequency of Social Gatherings with Friends and Family:   . Attends Religious Services:   . Active Member of Clubs or Organizations:   . Attends Archivist Meetings:   Marland Kitchen Marital Status:   Intimate Partner Violence:   . Fear of Current or Ex-Partner:   . Emotionally Abused:   Marland Kitchen Physically Abused:   . Sexually Abused:     Current Outpatient Medications:  .  acetaminophen (TYLENOL) 650 MG CR tablet, Take 650 mg by mouth every 8 (eight) hours as needed for pain. , Disp: , Rfl:  .  albuterol (VENTOLIN HFA) 108 (90 Base) MCG/ACT inhaler, Inhale 2 puffs  into the lungs every 6 (six) hours as needed for wheezing or shortness of breath., Disp: 6.7 g, Rfl: 1 .  aspirin 81 MG tablet, Take 81 mg by mouth daily., Disp: , Rfl:  .  B-D INS SYR ULTRAFINE 1CC/30G 30G X 1/2" 1 ML MISC, , Disp: , Rfl:  .  baclofen (LIORESAL) 10 MG tablet, TAKE 1 TABLET BY MOUTH AT BEDTIME AS NEEDED FOR MUSCLE SPASMS, Disp: 30 tablet, Rfl: 0 .  diclofenac sodium (VOLTAREN) 1 % GEL, Apply 3 g to 3 large joints up to 3 times daily. (Patient taking differently: Apply 2 g topically daily as needed (pain). ), Disp: 3 Tube,  Rfl: 3 .  folic acid (FOLVITE) 1 MG tablet, TAKE 2 TABLETS (2 MG TOTAL) BY MOUTH DAILY. (Patient taking differently: Take 1 mg by mouth 2 (two) times daily. ), Disp: 180 tablet, Rfl: 3 .  methotrexate 50 MG/2ML injection, INJECT 0.6 MLS (15 MG TOTAL) INTO THE SKIN ONCE A WEEK., Disp: 8 mL, Rfl: 0 .  ondansetron (ZOFRAN) 4 MG tablet, Take 1 tablet (4 mg total) by mouth every 8 (eight) hours as needed for nausea or vomiting., Disp: 20 tablet, Rfl: 0 .  pantoprazole (PROTONIX) 40 MG tablet, TAKE 1 TABLET BY MOUTH EVERY DAY, Disp: 90 tablet, Rfl: 1 .  predniSONE (DELTASONE) 5 MG tablet, Take 2.5 mg by mouth daily. , Disp: , Rfl:  .  senna-docusate (SENOKOT-S) 8.6-50 MG tablet, Take 1 tablet by mouth 2 (two) times daily. (Patient not taking: Reported on 03/30/2019), Disp: , Rfl:  .  Tuberculin-Allergy Syringes (ALLERGY SYRINGE 1CC/27GX1/2") 27G X 1/2" 1 ML MISC, Patient to use to inject MTX weekly, Disp: 12 each, Rfl: 3 .  valACYclovir (VALTREX) 500 MG tablet, TAKE 1 TABLET (500 MG TOTAL) BY MOUTH 2 (TWO) TIMES DAILY. FOR 3 DAYS WITH ACUTE EPISODES. (Patient not taking: Reported on 03/30/2019), Disp: 18 tablet, Rfl: 0 .  vitamin B-12 (CYANOCOBALAMIN) 100 MCG tablet, Take 100 mcg by mouth daily., Disp: , Rfl:  .  VITAMIN D, ERGOCALCIFEROL, PO, Take 1 capsule by mouth daily., Disp: , Rfl:  .  XELJANZ XR 11 MG TB24, Take 1 tablet by mouth daily., Disp: , Rfl:  .  albuterol  (PROVENTIL) (2.5 MG/3ML) 0.083% nebulizer solution, Take 3 mLs (2.5 mg total) by nebulization every 6 (six) hours as needed for wheezing or shortness of breath., Disp: 75 mL, Rfl: 12 .  famotidine (PEPCID) 20 MG tablet, Take 1-2 tablets (20-40 mg total) by mouth at bedtime., Disp: 60 tablet, Rfl: 1 .  fluticasone furoate-vilanterol (BREO ELLIPTA) 100-25 MCG/INH AEPB, Inhale 1 puff into the lungs daily., Disp: 28 each, Rfl: 0 .  hydrochlorothiazide (HYDRODIURIL) 25 MG tablet, Take 1 tablet (25 mg total) by mouth daily., Disp: 30 tablet, Rfl: 3 .  Omega-3 Fatty Acids (FISH OIL) 1000 MG CAPS, Take by mouth., Disp: , Rfl:  .  predniSONE (DELTASONE) 10 MG tablet, Take 2 tabs x 5 days, Disp: 10 tablet, Rfl: 0  EXAM:  VITALS per patient if applicable:BP (!) 153/94   Pulse 86   Ht 5\' 5"  (1.651 m)   BMI 35.94 kg/m   GENERAL: alert, oriented, appears well and in no acute distress  HEENT: atraumatic, conjunctiva clear, no obvious abnormalities on inspection.  NECK: normal movements of the head and neck  LUNGS: on inspection no signs of respiratory distress, breathing rate appears normal, no obvious gross SOB, gasping or wheezing  CV: no obvious cyanosis  MS: moves all visible extremities without noticeable abnormality  PSYCH/NEURO: pleasant and cooperative, no obvious depression, + anxious Speech and thought processing grossly intact  ASSESSMENT AND PLAN:  Discussed the following assessment and plan:  Orders Placed This Encounter  Procedures  . Basic metabolic panel    Nausea and vomiting in adult Possible etiologies discussed. Continue Zofran 4 mg tid as needed. She is going to call her GI and arrange appt. Instructed about warning signs.  HTN (hypertension) BP not well controlled. Possible complications of elevated BP discussed. She agrees with trying HCTZ 25 mg daily. Sinew monitoring BP regularly. Low salt diet also recommended. BMP in 2 weeks.  GERD Nausea and vomiting  could be related with this problem. Continue Protonix 40 mg daily. GERD precautions. Pepcid 20 to 40 mg added today to take at night. She is arranging appointment for follow-up with her GI.  Mild intermittent asthma Problem is not well controlled. For now continue albuterol 2 puffs every 6 hours as needed. She has an appointment tomorrow with her pulmonologist.  Chronic fatigue Possible etiology discussed. A few of her chronic medical problems could be contributing factors.  Fibromyalgia Educated about dx,prognosis,and treatment options. Low impact exercise and good sleep hygiene. Continue following with rheuma.   I discussed the assessment and treatment plan with the patient. Ms Will was provided an opportunity to ask questions and all were answered. The patient agreed with the plan and demonstrated an understanding of the instructions.   Work excuse from 3/8 to 04/10/19 will be given.   Return in about 2 months (around 05/29/2019) for HTN. BMP in 2 weeks..     [place], MD

## 2019-03-29 NOTE — Assessment & Plan Note (Signed)
Nausea and vomiting could be related with this problem. Continue Protonix 40 mg daily. GERD precautions. Pepcid 20 to 40 mg added today to take at night. She is arranging appointment for follow-up with her GI.

## 2019-03-29 NOTE — Assessment & Plan Note (Signed)
Problem is not well controlled. For now continue albuterol 2 puffs every 6 hours as needed. She has an appointment tomorrow with her pulmonologist.

## 2019-03-29 NOTE — Telephone Encounter (Signed)
Patient had virtual visit with Dr. [place] today. Work note provided for patient to be out from 3/8 until 3/22 and sent via mychart for patient to print. Will hold onto FMLA paperwork incase it needs to be completed for these dates.

## 2019-03-30 ENCOUNTER — Other Ambulatory Visit: Payer: Self-pay

## 2019-03-30 ENCOUNTER — Ambulatory Visit (INDEPENDENT_AMBULATORY_CARE_PROVIDER_SITE_OTHER): Payer: BC Managed Care – PPO | Admitting: Primary Care

## 2019-03-30 ENCOUNTER — Encounter: Payer: Self-pay | Admitting: Primary Care

## 2019-03-30 VITALS — BP 112/76 | HR 82 | Temp 97.2°F | Ht 65.0 in | Wt 214.0 lb

## 2019-03-30 DIAGNOSIS — J4520 Mild intermittent asthma, uncomplicated: Secondary | ICD-10-CM | POA: Diagnosis not present

## 2019-03-30 DIAGNOSIS — M359 Systemic involvement of connective tissue, unspecified: Secondary | ICD-10-CM

## 2019-03-30 MED ORDER — PREDNISONE 10 MG PO TABS: ORAL_TABLET | ORAL | 0 refills | Status: DC

## 2019-03-30 MED ORDER — FLUTICASONE FUROATE-VILANTEROL 100-25 MCG/INH IN AEPB: 1.0000 | INHALATION_SPRAY | Freq: Every day | RESPIRATORY_TRACT | 0 refills | Status: AC

## 2019-03-30 MED ORDER — ALBUTEROL SULFATE (2.5 MG/3ML) 0.083% IN NEBU: 2.5000 mg | INHALATION_SOLUTION | Freq: Four times a day (QID) | RESPIRATORY_TRACT | 12 refills | Status: AC | PRN

## 2019-03-30 NOTE — Progress Notes (Signed)
@Patient  ID: , female    DOB: 1960-09-21, 59 y.o.   MRN: [id number]  Chief Complaint  Patient presents with  . Follow-up    Pt recently hospitalized for gastritis. Pt states her breathing has recently worsened. Pt c/o increase in SOB, cough with little mucus production, chest congestion, lower back pain - she thinks might be from coughing; s/s x 1 month. Pt denies f/c/s.     Referring provider: [id number], [name], MD  HPI: 59 year old female, never smoked. 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 Patient of Dr. 59. Maintained on Plaquenil and methotrexate. PFTs showed normal airflow without significant bronchodilator response, mild restriction and slight decreased diffusion capacity. No maintenance bronchodilator or ICS. Needs annual CXR d/t autoimmune disease. If imaging or symptoms change in any way needs CT scan.   Previous [facility] pulmonary encounter: 08/19/2018 Patient presents today for annual follow-up. She is doing really well. No breathing issues. Humidity does worsen her shortness of breath but she has not had to use her rescue inhaler. Since COVID she has been out of work and her Lupus and asthma symptoms have been well controlled. Saw rheumatology on 7/15, continues 1/2 tab prednisone and methotrexate weekly injections. Due for CXR today.    03/30/2019 Patient presents today for hospital follow-up. She was recently hospitalized in February for gastritis. Reports that she had an asthma attack while she was admitted. She is doing ok, states that her body feels very tired and worn down. States that she feels that if she doesn't get some rest she will be back in the hospital longer d.t her RA and Lupus. Her plan is to retire in June from [employer]. States that stress negatively effects her body. She is out of work for two weeks, letter provided by her PCP.  In terms of her respiratory status, she has a dry cough with occasional chest tightness and shortness of breath. States that she has a difficult time getting enough air or taking a deep breath. She is using her rescue inhaler every 6 hours which is not typical for her. She needs a new nebulizer machine and solution.   03/30/2017 PFT- FVC 2.95 (101), FEV1 2.66 (115), ratio 90, mid flow +BD, DLCOcor 69%   Allergies  Allergen Reactions  . Aspirin Other (See Comments)    GI bleed  . Demerol [Meperidine]   . Hydrocodone-Acetaminophen Hives and Nausea And Vomiting  . Ibuprofen Other (See Comments)    GI bleed  . Imdur [Isosorbide Nitrate] Other (See Comments)    Reaction unknown  . Meperidine Hcl Hives and Nausea And Vomiting    Short term memory loss  . Morphine Hives and Nausea And Vomiting  . Oxycodone-Acetaminophen Hives and Nausea And Vomiting    Reaction unknown  . Procaine Hcl Other (See Comments)    Ineffective  . Toprol Xl [Metoprolol] Other (See Comments)    Reaction unknown  . Tramadol Itching    Immunization History  Administered Date(s) Administered  . Influenza, Quadrivalent, Recombinant, Inj, Pf 10/11/2017  . Influenza,inj,Quad PF,6+ Mos 11/04/2015, 10/21/2018  . PFIZER SARS-COV-2 Vaccination 03/20/2019  . Zoster Recombinat (Shingrix) 10/11/2017, 12/25/2017    Past Medical History:  Diagnosis Date  . Allergy   . Anemia   . Asthma   . Blood transfusion without reported diagnosis   . Fibromyalgia   . GERD (gastroesophageal reflux disease)   .  Heart murmur   . High cholesterol   . History of stomach ulcers   . Hypertension   . Lupus ([place])   . MI, old   . Rheumatoid arthritis ([place])   . Thyroid disease   . Thyroid mass   . Vertigo     Tobacco History: Social History   Tobacco Use  Smoking Status Never Smoker  Smokeless Tobacco Never Used   Counseling given: Not Answered   Outpatient Medications Prior to Visit  Medication Sig Dispense Refill  .  acetaminophen (TYLENOL) 650 MG CR tablet Take 650 mg by mouth every 8 (eight) hours as needed for pain.     Marland Kitchen albuterol (VENTOLIN HFA) 108 (90 Base) MCG/ACT inhaler Inhale 2 puffs into the lungs every 6 (six) hours as needed for wheezing or shortness of breath. 6.7 g 1  . aspirin 81 MG tablet Take 81 mg by mouth daily.    . B-D INS SYR ULTRAFINE 1CC/30G 30G X 1/2" 1 ML MISC     . baclofen (LIORESAL) 10 MG tablet TAKE 1 TABLET BY MOUTH AT BEDTIME AS NEEDED FOR MUSCLE SPASMS 30 tablet 0  . diclofenac sodium (VOLTAREN) 1 % GEL Apply 3 g to 3 large joints up to 3 times daily. (Patient taking differently: Apply 2 g topically daily as needed (pain). ) 3 Tube 3  . famotidine (PEPCID) 20 MG tablet Take 1-2 tablets (20-40 mg total) by mouth at bedtime. 60 tablet 1  . folic acid (FOLVITE) 1 MG tablet TAKE 2 TABLETS (2 MG TOTAL) BY MOUTH DAILY. (Patient taking differently: Take 1 mg by mouth 2 (two) times daily. ) 180 tablet 3  . hydrochlorothiazide (HYDRODIURIL) 25 MG tablet Take 1 tablet (25 mg total) by mouth daily. 30 tablet 3  . methotrexate 50 MG/2ML injection INJECT 0.6 MLS (15 MG TOTAL) INTO THE SKIN ONCE A WEEK. 8 mL 0  . Omega-3 Fatty Acids (FISH OIL) 1000 MG CAPS Take by mouth.    . ondansetron (ZOFRAN) 4 MG tablet Take 1 tablet (4 mg total) by mouth every 8 (eight) hours as needed for nausea or vomiting. 20 tablet 0  . pantoprazole (PROTONIX) 40 MG tablet TAKE 1 TABLET BY MOUTH EVERY DAY 90 tablet 1  . predniSONE (DELTASONE) 5 MG tablet Take 2.5 mg by mouth daily.     . Tuberculin-Allergy Syringes (ALLERGY SYRINGE 1CC/27GX1/2") 27G X 1/2" 1 ML MISC Patient to use to inject MTX weekly 12 each 3  . vitamin B-12 (CYANOCOBALAMIN) 100 MCG tablet Take 100 mcg by mouth daily.    Marland Kitchen VITAMIN D, ERGOCALCIFEROL, PO Take 1 capsule by mouth daily.    [name] 11 MG TB24 Take 1 tablet by mouth daily.    Marland Kitchen senna-docusate (SENOKOT-S) 8.6-50 MG tablet Take 1 tablet by mouth 2 (two) times daily. (Patient not  taking: Reported on 03/30/2019)    . valACYclovir (VALTREX) 500 MG tablet TAKE 1 TABLET (500 MG TOTAL) BY MOUTH 2 (TWO) TIMES DAILY. FOR 3 DAYS WITH ACUTE EPISODES. (Patient not taking: Reported on 03/30/2019) 18 tablet 0   No facility-administered medications prior to visit.   Review of Systems  Review of Systems  Constitutional: Negative.   Respiratory: Positive for cough and shortness of breath.   Cardiovascular: Negative.      Physical Exam  BP 112/76 (BP Location: Left Arm, Cuff Size: Normal)   Pulse 82   Temp (!) 97.2 F (36.2 C) (Skin)   Ht 5\' 5"  (1.651 m)  Wt 214 lb (97.1 kg)   SpO2 100%   BMI 35.61 kg/m  Physical Exam Constitutional:      Appearance: Normal appearance.  HENT:     Head: Normocephalic and atraumatic.     Mouth/Throat:     Mouth: Mucous membranes are moist.     Pharynx: Oropharynx is clear.  Cardiovascular:     Rate and Rhythm: Normal rate and regular rhythm.  Pulmonary:     Effort: Pulmonary effort is normal.     Breath sounds: Normal breath sounds.  Musculoskeletal:        General: Normal range of motion.  Skin:    General: Skin is warm and dry.  Neurological:     General: No focal deficit present.     Mental Status: She is alert and oriented to person, place, and time. Mental status is at baseline.  Psychiatric:        Mood and Affect: Mood normal.        Behavior: Behavior normal.        Thought Content: Thought content normal.        Judgment: Judgment normal.      Lab Results:  CBC    Component Value Date/Time   WBC 4.2 03/13/2019 0123   RBC 3.94 03/13/2019 0123   HGB 11.2 (L) 03/13/2019 0123   HCT 36.1 03/13/2019 0123   PLT 276 03/13/2019 0123   MCV 91.6 03/13/2019 0123   MCH 28.4 03/13/2019 0123   MCHC 31.0 03/13/2019 0123   RDW 14.3 03/13/2019 0123   LYMPHSABS 0.7 03/13/2019 0123   MONOABS 0.3 03/13/2019 0123   EOSABS 0.1 03/13/2019 0123   BASOSABS 0.0 03/13/2019 0123    BMET    Component Value Date/Time   NA  139 03/14/2019 0437   K 4.4 03/14/2019 0437   CL 108 03/14/2019 0437   CO2 23 03/14/2019 0437   GLUCOSE 129 (H) 03/14/2019 0437   BUN <5 (L) 03/14/2019 0437   CREATININE 0.71 03/14/2019 0437   CREATININE 0.77 01/17/2019 1100   CALCIUM 8.8 (L) 03/14/2019 0437   GFRNONAA >60 03/14/2019 0437   GFRNONAA 86 01/17/2019 1100   GFRAA >60 03/14/2019 0437   GFRAA 100 01/17/2019 1100    BNP No results found for: BNP  ProBNP    Component Value Date/Time   PROBNP 28.2 11/25/2012 0543    Imaging: DG Abdomen Acute W/Chest  Result Date: 03/11/2019 CLINICAL DATA:  Epigastric pain.  History of peptic ulcer disease. EXAM: DG ABDOMEN ACUTE W/ 1V CHEST COMPARISON:  Chest x-ray 08/19/2018 FINDINGS: Lungs are adequately inflated without airspace consolidation or effusion. Cardiomediastinal silhouette and remainder of the chest is unchanged. Abdominopelvic images demonstrate a nonspecific, nonobstructive bowel gas pattern with several air-filled nondilated small bowel loops. Air is present throughout the colon. There is no free peritoneal air or significant air-fluid levels. Mild degenerative change of the spine. Several pelvic phleboliths are present. IMPRESSION: Nonobstructive bowel gas pattern. No acute cardiopulmonary disease. Electronically Signed   By: Elberta Fortis M.D.   On: 03/11/2019 12:20   DG UGI W DOUBLE CM (HD BA)  Result Date: 03/13/2019 CLINICAL DATA:  Vomiting. EXAM: UPPER GI SERIES WITH KUB TECHNIQUE: After obtaining a scout radiograph a routine upper GI series was performed using thin and high density barium. FLUOROSCOPY TIME:  Fluoroscopy Time:  1 minutes 24 seconds Radiation Exposure Index (if provided by the fluoroscopic device): 5 Number of Acquired Spot Images: 0 COMPARISON:  None. FINDINGS: Scout view of the  abdomen shows a normal bowel gas pattern. Double contrast examination of the upper gastrointestinal tract shows mildly sluggish esophageal motility. No esophageal fold thickening,  stricture or obstruction. Stomach is unremarkable. Duodenal bulb is largely obscured by the distal stomach, limiting evaluation. IMPRESSION: 1. Mildly sluggish esophageal motility. 2. Examination duodenum is limited.  Otherwise unremarkable exam. Electronically Signed   By: Leanna Battles M.D.   On: 03/13/2019 08:50     Assessment & Plan:   Mild intermittent asthma - Requiring albuterol every 4-6 hours, which is new - TRIAL Breo 100, one puff once daily  - Recommend repeat PFTs prior to next visit  - Per Dr. Kavin Leech last note if patient has any respiratory changes he recommend getting CT chest d/t her mixed connective tissue disease.  -She would like to wait on getting CXR or CT until she gets some rest  - Plan FU in 3 months   Autoimmune disease (HCC) - Mixed connective tissue disease, inflammatory disease and MTX put her at risk for pulmonary disease  - Continues 2.5mg  prednisone, [name] and Methotrexate IM weekly  - Follows with Rheumatology - Patient declined imaging today, recommend annual CXR and consider CT chest at next office visit if respiratory symptoms persist    Glenford Bayley, NP 03/31/2019

## 2019-03-30 NOTE — Patient Instructions (Signed)
  Pleasure seeing you today Ms Mehan  Recommendations: - Try Breo inhaler- take one puff daily in the morning (rinse mouth after use) - Use albuterol rescue inhaler or nebulizer every 4-6 hours for breakthrough shortness of breath/wheezing   Orders: - New nebulizer machine  - PFTs in June/July  Follow-up: - 2 week televisit with Beth - 3-4 Months with Dr. Delton Coombes after PFTs

## 2019-03-31 ENCOUNTER — Telehealth: Payer: Self-pay | Admitting: Family Medicine

## 2019-03-31 NOTE — Telephone Encounter (Signed)
Patient called stating that [facility] needs the forms completed.  Please advise

## 2019-03-31 NOTE — Telephone Encounter (Signed)
Scheduled pt for 2 month and 2 week follow up that Dr. [place] sent back

## 2019-03-31 NOTE — Assessment & Plan Note (Signed)
-   Mixed connective tissue disease, inflammatory disease and MTX put her at risk for pulmonary disease  - Continues 2.5mg  prednisone, [name] and Methotrexate IM weekly  - Follows with Rheumatology - Patient declined imaging today, recommend annual CXR and consider CT chest at next office visit if respiratory symptoms persist

## 2019-03-31 NOTE — Telephone Encounter (Signed)
Pt is calling in stating that she would like to pick the forms up today to turn in to her employer due to them asking for her turn them in today.

## 2019-03-31 NOTE — Telephone Encounter (Signed)
Forms completed & in pcp's folder to be signed.

## 2019-03-31 NOTE — Telephone Encounter (Signed)
Patient is aware forms have been signed & are ready for pick up.

## 2019-03-31 NOTE — Assessment & Plan Note (Signed)
-   Requiring albuterol every 4-6 hours, which is new - TRIAL Breo 100, one puff once daily  - Recommend repeat PFTs prior to next visit  - Per Dr. Kavin Leech last note if patient has any respiratory changes he recommend getting CT chest d/t her mixed connective tissue disease.  -She would like to wait on getting CXR or CT until she gets some rest  - Plan FU in 3 months

## 2019-03-31 NOTE — Telephone Encounter (Signed)
Noted, will work on filling out forms today for pt.

## 2019-04-05 ENCOUNTER — Other Ambulatory Visit: Payer: Self-pay | Admitting: Nurse Practitioner

## 2019-04-05 DIAGNOSIS — K2940 Chronic atrophic gastritis without bleeding: Secondary | ICD-10-CM

## 2019-04-05 DIAGNOSIS — R1013 Epigastric pain: Secondary | ICD-10-CM

## 2019-04-07 ENCOUNTER — Other Ambulatory Visit: Payer: Self-pay

## 2019-04-07 DIAGNOSIS — Z79899 Other long term (current) drug therapy: Secondary | ICD-10-CM

## 2019-04-07 NOTE — Telephone Encounter (Signed)
Yes I am ok with extending her leave. I can sign note today or Monday

## 2019-04-07 NOTE — Telephone Encounter (Signed)
Patricia Reed please advise on patient email.  Thanks!   Good Morning Dr. Clent Ridges,      Previously we discussed future care for me as time progressed.  We discussed more time from work if needed  due to over work and stress build up which is creating more isuues with existing conditions.  I receieved a phone call from employer [employer]), discussing return to work and if an extention is needed to add to Leave Time.  If you could, please give me an extension on my leave.  I am requesting  March 23 - April 28, 2019.  This will allow more rest and me to get fully adjusted to new meds perscribed by you as well as primary care physician.  As for the new meds, the Virgel Bouquet is working well thus far.  I've ony used the nebulizer twice since my last visit with you.  I am currently experiencing severe discomfort in my joints and on my way to orthopedics for blood work.  I am almost definite I am currently in another Fibro Flare which causes joint swelling and organ issues as well.  Thank you so much for your prompt attention and care.

## 2019-04-08 LAB — COMPLETE METABOLIC PANEL WITH GFR
AG Ratio: 1.4 (calc) (ref 1.0–2.5)
ALT: 21 U/L (ref 6–29)
AST: 19 U/L (ref 10–35)
Albumin: 4.4 g/dL (ref 3.6–5.1)
Alkaline phosphatase (APISO): 66 U/L (ref 37–153)
BUN: 11 mg/dL (ref 7–25)
CO2: 34 mmol/L — ABNORMAL HIGH (ref 20–32)
Calcium: 9.4 mg/dL (ref 8.6–10.4)
Chloride: 98 mmol/L (ref 98–110)
Creat: 0.82 mg/dL (ref 0.50–1.05)
GFR, Est African American: 93 mL/min/{1.73_m2} (ref >=60)
GFR, Est Non African American: 80 mL/min/{1.73_m2} (ref >=60)
Globulin: 3.2 g/dL (calc) (ref 1.9–3.7)
Glucose, Bld: 98 mg/dL (ref 65–99)
Potassium: 3.5 mmol/L (ref 3.5–5.3)
Sodium: 143 mmol/L (ref 135–146)
Total Bilirubin: 0.8 mg/dL (ref 0.2–1.2)
Total Protein: 7.6 g/dL (ref 6.1–8.1)

## 2019-04-08 LAB — CBC WITH DIFFERENTIAL/PLATELET
Absolute Monocytes: 326 cells/uL (ref 200–950)
Basophils Absolute: 41 cells/uL (ref 0–200)
Basophils Relative: 1.1 %
Eosinophils Absolute: 130 cells/uL (ref 15–500)
Eosinophils Relative: 3.5 %
HCT: 42.2 % (ref 35.0–45.0)
Hemoglobin: 13.3 g/dL (ref 11.7–15.5)
Lymphs Abs: 844 cells/uL — ABNORMAL LOW (ref 850–3900)
MCH: 27.8 pg (ref 27.0–33.0)
MCHC: 31.5 g/dL — ABNORMAL LOW (ref 32.0–36.0)
MCV: 88.3 fL (ref 80.0–100.0)
MPV: 10.9 fL (ref 7.5–12.5)
Monocytes Relative: 8.8 %
Neutro Abs: 2361 cells/uL (ref 1500–7800)
Neutrophils Relative %: 63.8 %
Platelets: 318 10*3/uL (ref 140–400)
RBC: 4.78 10*6/uL (ref 3.80–5.10)
RDW: 13.4 % (ref 11.0–15.0)
Total Lymphocyte: 22.8 %
WBC: 3.7 10*3/uL — ABNORMAL LOW (ref 3.8–10.8)

## 2019-04-10 ENCOUNTER — Telehealth: Payer: Self-pay | Admitting: *Deleted

## 2019-04-10 DIAGNOSIS — Z79899 Other long term (current) drug therapy: Secondary | ICD-10-CM

## 2019-04-10 NOTE — Telephone Encounter (Signed)
Bre, pls contact the patient, she was to schedule a follow up appt in our office after her hospital admission 02/2019. Pls refill her Pantoprazole 40mg  1 po bid # 60 no additional refills after she is scheduled for a follow up appt. thx

## 2019-04-10 NOTE — Telephone Encounter (Signed)
-----   Message from Gearldine Bienenstock, PA-C sent at 04/10/2019  3:02 PM EDT ----- Co2 elevated.  Rest of CMP WNL.  WBC count is borderline low.  She has a history of leukopenia.  Please advise patient to return in 1 month to recheck CBC.

## 2019-04-10 NOTE — Progress Notes (Signed)
Co2 elevated.  Rest of CMP WNL.  WBC count is borderline low.  She has a history of leukopenia.  Please advise patient to return in 1 month to recheck CBC.

## 2019-04-12 ENCOUNTER — Telehealth: Payer: Self-pay | Admitting: Family Medicine

## 2019-04-12 ENCOUNTER — Ambulatory Visit: Payer: BC Managed Care – PPO | Attending: Internal Medicine

## 2019-04-12 DIAGNOSIS — Z23 Encounter for immunization: Secondary | ICD-10-CM

## 2019-04-12 NOTE — Telephone Encounter (Signed)
Patient dropped off FMLA forms  Call patient to pick up forms at: [phone]  Disposition: Dr's Folder

## 2019-04-12 NOTE — Progress Notes (Signed)
   Covid-19 Vaccination Clinic  Name:  SERA HITSMAN    MRN: [id number] DOB: 1960/12/15  04/12/2019  Ms. Leavell was observed post Covid-19 immunization for 15 minutes without incident. She was provided with Vaccine Information Sheet and instruction to access the V-Safe system.   Ms. Lapid was instructed to call 911 with any severe reactions post vaccine: Marland Kitchen Difficulty breathing  . Swelling of face and throat  . A fast heartbeat  . A bad rash all over body  . Dizziness and weakness   Immunizations Administered    Name Date Dose VIS Date Route   Pfizer COVID-19 Vaccine 04/12/2019  2:32 PM 0.3 mL 12/30/2018 Intramuscular   Manufacturer: [employer], [employer]   Lot: [id number]   NDC: [address]-0

## 2019-04-12 NOTE — Telephone Encounter (Signed)
Forms received.  

## 2019-04-13 ENCOUNTER — Ambulatory Visit: Payer: BC Managed Care – PPO | Admitting: Rheumatology

## 2019-04-14 ENCOUNTER — Ambulatory Visit: Payer: BC Managed Care – PPO | Admitting: Family Medicine

## 2019-04-14 ENCOUNTER — Encounter: Payer: Self-pay | Admitting: Primary Care

## 2019-04-14 ENCOUNTER — Telehealth: Payer: Self-pay | Admitting: Emergency Medicine

## 2019-04-14 ENCOUNTER — Ambulatory Visit (INDEPENDENT_AMBULATORY_CARE_PROVIDER_SITE_OTHER): Payer: BC Managed Care – PPO | Admitting: Primary Care

## 2019-04-14 ENCOUNTER — Other Ambulatory Visit: Payer: Self-pay

## 2019-04-14 DIAGNOSIS — T50Z95A Adverse effect of other vaccines and biological substances, initial encounter: Secondary | ICD-10-CM

## 2019-04-14 NOTE — Telephone Encounter (Signed)
Patient is aware that form is up front and ready for pick up.

## 2019-04-14 NOTE — Progress Notes (Signed)
Virtual Visit via Telephone Note  I connected with Patricia Reed on 04/14/19 at  4:15 PM EDT by telephone and verified that I am speaking with the correct person using two identifiers.  Location: Patient: Home Provider: Office   I discussed the limitations, risks, security and privacy concerns of performing an evaluation and management service by telephone and the availability of in person appointments. I also discussed with the patient that there may be a patient responsible charge related to this service. The patient expressed understanding and agreed to proceed.   History of Present Illness: 59 year old female, never smoked. 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 Patient of Dr. Delton Coombes. Maintained on Plaquenil and methotrexate. PFTs showed normal airflow without significant bronchodilator response, mild restriction and slight decreased diffusion capacity. No maintenance bronchodilator or ICS. Needs annual CXR d/t autoimmune disease. If imaging or symptoms change in any way needs CT scan.   Previous [facility] pulmonary encounter: 08/19/2018 Patient presents today for annual follow-up. She is doing really well. No breathing issues. Humidity does worsen her shortness of breath but she has not had to use her rescue inhaler. Since COVID she has been out of work and her Lupus and asthma symptoms have been well controlled. Saw rheumatology on 7/15, continues 1/2 tab prednisone and methotrexate weekly injections. Due for CXR today.   03/30/2019 Patient presents today for hospital follow-up. She was recently hospitalized in February for gastritis. Reports that she had an asthma attack while she was admitted. She is doing ok, states that her body feels very tired and worn down. States that she feels that if she doesn't get some rest she will be back in the hospital longer d.t her RA and  Lupus. Her plan is to retire in June from [employer]. States that stress negatively effects her body. She is out of work for two weeks, letter provided by her PCP. In terms of her respiratory status, she has a dry cough with occasional chest tightness and shortness of breath. States that she has a difficult time getting enough air or taking a deep breath. She is using her rescue inhaler every 6 hours which is not typical for her. She needs a new nebulizer machine and solution.   04/14/2019 Patient contacted today for acute televisit, states that she received her second covid vaccine yesterday and developed side effects. Reports low grade fever 99.5-100, palpitations and joint/muscle pain. She took tylenol arthritis yesterday as well as her albuterol rescue inhaler. Her symptoms after started to subside.  Denies rash, swelling, difficulty breathing, chest tightness/pain. Ok to resume prednisone 2.5 mg daily.    Observations/Objective:  - Able to speak in full sentences - No shortness of breath, wheezing or cough  Assessment and Plan:  Vaccine reaction: - Patient had a normal immune response to Covid-19 vaccine - Recommend tylenol prn fever, chills or aches  Mild intermittent asthma: - Continue Breo 100; prn albuterol 2 puffs every 6 hours for sob/wheezing - FU in 3 months   Autoimmune disease: - Mixed connective tissue disease/ inflammatory disease/MTX put her at risk for pulmonary disease - Continues 2.5mg  prednisone, [name] andMethotrexate IM weekly  - Follows with Rheumatology  Follow Up Instructions:  -As needed; 3 months with Dr. Delton Coombes with PFTs I discussed the assessment and treatment plan with the patient. The patient was provided an opportunity to ask questions and all were answered. The patient  agreed with the plan and demonstrated an understanding of the instructions.   The patient was advised to call back or seek an in-person evaluation if the symptoms worsen or if the  condition fails to improve as anticipated.  I provided 18 minutes of non-face-to-face time during this encounter.   Martyn Ehrich, NP

## 2019-04-17 ENCOUNTER — Ambulatory Visit (INDEPENDENT_AMBULATORY_CARE_PROVIDER_SITE_OTHER): Payer: BC Managed Care – PPO | Admitting: Family Medicine

## 2019-04-17 ENCOUNTER — Encounter: Payer: Self-pay | Admitting: Family Medicine

## 2019-04-17 VITALS — BP 132/92 | HR 73 | Temp 98.0°F | Resp 16 | Ht 65.0 in | Wt 211.0 lb

## 2019-04-17 DIAGNOSIS — I10 Essential (primary) hypertension: Secondary | ICD-10-CM | POA: Diagnosis not present

## 2019-04-17 DIAGNOSIS — A6000 Herpesviral infection of urogenital system, unspecified: Secondary | ICD-10-CM | POA: Diagnosis not present

## 2019-04-17 DIAGNOSIS — K219 Gastro-esophageal reflux disease without esophagitis: Secondary | ICD-10-CM | POA: Diagnosis not present

## 2019-04-17 DIAGNOSIS — D573 Sickle-cell trait: Secondary | ICD-10-CM

## 2019-04-17 DIAGNOSIS — Z6835 Body mass index (BMI) 35.0-35.9, adult: Secondary | ICD-10-CM

## 2019-04-17 MED ORDER — LOSARTAN POTASSIUM 25 MG PO TABS: 25.0000 mg | ORAL_TABLET | Freq: Every day | ORAL | 1 refills | Status: DC

## 2019-04-17 MED ORDER — VALACYCLOVIR HCL 500 MG PO TABS: ORAL_TABLET | ORAL | 1 refills | Status: DC

## 2019-04-17 NOTE — Patient Instructions (Addendum)
A few things to remember from today's visit:  Losartan 25 mg added today. No changes in HCTZ. BMP will be done at your rheumatologist's office,let me know if you does not.  Monitor blood pressure at home.  Please be sure medication list is accurate. If a new problem present, please set up appointment sooner than planned today.

## 2019-04-17 NOTE — Progress Notes (Signed)
Patricia Reed is a 59 y.o.female, who is here today to follow on HTN.  Last seen 30/10/21.  Currently on HCTZ 25 mg daily.. She is taking medications as instructed, no side effects reported.  She has not noted unusual headache, visual changes, exertional chest pain, dyspnea,  focal weakness, or edema. Home BP readings: Not checking it regularly.   Lab Results  Component Value Date   CREATININE 0.82 04/07/2019   BUN 11 04/07/2019   NA 143 04/07/2019   K 3.5 04/07/2019   CL 98 04/07/2019   CO2 34 (H) 04/07/2019   She already got her 1st COVID 19 vaccine, had 24 hours of flu like symptoms , all symptoms resolved.  -Planning on starting going to the gym tomorrow. She is trying to eat healthier and has noted some wt loss.  GERD: She is on Protonix 40 mg bid. Nausea and vomiting have improved. She has not seen GI. Denies abdominal pain, changes in bowel habits, blood in stool or melena.  Requesting refills for Valtrex. Hx of recurrent genital herpes. She has not identified exacerbating or alleviating factors. Having flare ups twice per month. Valtrex x 3 days helps.  Negative for vaginal discharge or bleeding, no urinary symptoms.  Hx of sickle cells trait.  Lab Results  Component Value Date   WBC 3.7 (L) 04/07/2019   HGB 13.3 04/07/2019   HCT 42.2 04/07/2019   MCV 88.3 04/07/2019   PLT 318 04/07/2019    Review of Systems  Constitutional: Negative for activity change, appetite change, fatigue, fever and unexpected weight change.  HENT: Negative for mouth sores, nosebleeds, sore throat and trouble swallowing.   Respiratory: Negative for cough and wheezing.   Cardiovascular: Negative for palpitations.  Genitourinary: Negative for decreased urine volume, dysuria and hematuria.  Musculoskeletal: Positive for arthralgias and myalgias. Negative for gait problem.  Skin: Negative for pallor and rash.  Neurological: Negative for syncope, facial asymmetry and  numbness.  Psychiatric/Behavioral: Negative for confusion. The patient is nervous/anxious.   Rest see pertinent positives and negatives per HPI.  Current Outpatient Medications on File Prior to Visit  Medication Sig Dispense Refill  . acetaminophen (TYLENOL) 650 MG CR tablet Take 650 mg by mouth every 8 (eight) hours as needed for pain.     Marland Kitchen albuterol (PROVENTIL) (2.5 MG/3ML) 0.083% nebulizer solution Take 3 mLs (2.5 mg total) by nebulization every 6 (six) hours as needed for wheezing or shortness of breath. 75 mL 12  . albuterol (VENTOLIN HFA) 108 (90 Base) MCG/ACT inhaler Inhale 2 puffs into the lungs every 6 (six) hours as needed for wheezing or shortness of breath. 6.7 g 1  . aspirin 81 MG tablet Take 81 mg by mouth daily.    . B-D INS SYR ULTRAFINE 1CC/30G 30G X 1/2" 1 ML MISC     . baclofen (LIORESAL) 10 MG tablet TAKE 1 TABLET BY MOUTH AT BEDTIME AS NEEDED FOR MUSCLE SPASMS 30 tablet 0  . diclofenac sodium (VOLTAREN) 1 % GEL Apply 3 g to 3 large joints up to 3 times daily. (Patient taking differently: Apply 2 g topically daily as needed (pain). ) 3 Tube 3  . famotidine (PEPCID) 20 MG tablet Take 1-2 tablets (20-40 mg total) by mouth at bedtime. 60 tablet 1  . fluticasone furoate-vilanterol (BREO ELLIPTA) 100-25 MCG/INH AEPB Inhale 1 puff into the lungs daily. 28 each 0  . folic acid (FOLVITE) 1 MG tablet TAKE 2 TABLETS (2 MG TOTAL) BY MOUTH  DAILY. (Patient taking differently: Take 1 mg by mouth 2 (two) times daily. ) 180 tablet 3  . hydrochlorothiazide (HYDRODIURIL) 25 MG tablet Take 1 tablet (25 mg total) by mouth daily. 30 tablet 3  . methotrexate 50 MG/2ML injection INJECT 0.6 MLS (15 MG TOTAL) INTO THE SKIN ONCE A WEEK. 8 mL 0  . Omega-3 Fatty Acids (FISH OIL) 1000 MG CAPS Take by mouth.    . pantoprazole (PROTONIX) 40 MG tablet TAKE 1 TABLET BY MOUTH TWICE A DAY 60 tablet 0  . predniSONE (DELTASONE) 5 MG tablet Take 2.5 mg by mouth daily.     Marland Kitchen senna-docusate (SENOKOT-S) 8.6-50 MG  tablet Take 1 tablet by mouth 2 (two) times daily.    . Tuberculin-Allergy Syringes (ALLERGY SYRINGE 1CC/27GX1/2") 27G X 1/2" 1 ML MISC Patient to use to inject MTX weekly 12 each 3  . vitamin B-12 (CYANOCOBALAMIN) 100 MCG tablet Take 100 mcg by mouth daily.    Marland Kitchen VITAMIN D, ERGOCALCIFEROL, PO Take 1 capsule by mouth daily.    [name] XR 11 MG TB24 Take 1 tablet by mouth daily.    . ondansetron (ZOFRAN) 4 MG tablet Take 1 tablet (4 mg total) by mouth every 8 (eight) hours as needed for nausea or vomiting. (Patient not taking: Reported on 04/17/2019) 20 tablet 0   No current facility-administered medications on file prior to visit.   Past Medical History:  Diagnosis Date  . Allergy   . Anemia   . Asthma   . Blood transfusion without reported diagnosis   . Fibromyalgia   . GERD (gastroesophageal reflux disease)   . Heart murmur   . High cholesterol   . History of stomach ulcers   . Hypertension   . Lupus (HCC)   . MI, old   . Rheumatoid arthritis (HCC)   . Thyroid disease   . Thyroid mass   . Vertigo     Allergies  Allergen Reactions  . Aspirin Other (See Comments)    GI bleed  . Demerol [Meperidine]   . Hydrocodone-Acetaminophen Hives and Nausea And Vomiting  . Ibuprofen Other (See Comments)    GI bleed  . Imdur [Isosorbide Nitrate] Other (See Comments)    Reaction unknown  . Meperidine Hcl Hives and Nausea And Vomiting    Short term memory loss  . Morphine Hives and Nausea And Vomiting  . Oxycodone-Acetaminophen Hives and Nausea And Vomiting    Reaction unknown  . Procaine Hcl Other (See Comments)    Ineffective  . Toprol Xl [Metoprolol] Other (See Comments)    Reaction unknown  . Tramadol Itching    Social History   Socioeconomic History  . Marital status: Married    Spouse name: Not on file  . Number of children: 4  . Years of education: Not on file  . Highest education level: Not on file  Occupational History  . Occupation: Veterinary surgeon: [employer]  Tobacco Use  . Smoking status: Never Smoker  . Smokeless tobacco: Never Used  Substance and Sexual Activity  . Alcohol use: Yes    Alcohol/week: 1.0 standard drinks    Types: 1 Glasses of wine per week    Comment: occ  . Drug use: No  . Sexual activity: Yes    Birth control/protection: None, Surgical    Comment: LAVH  Other Topics Concern  . Not on file  Social History Narrative   First grade Geologist, engineering.  Lives with husband,  daughter and 3 grands.     Social Determinants of Health   Financial Resource Strain:   . Difficulty of Paying Living Expenses:   Food Insecurity:   . Worried About Programme researcher, broadcasting/film/video in the Last Year:   . Barista in the Last Year:   Transportation Needs:   . Freight forwarder (Medical):   Marland Kitchen Lack of Transportation (Non-Medical):   Physical Activity:   . Days of Exercise per Week:   . Minutes of Exercise per Session:   Stress:   . Feeling of Stress :   Social Connections:   . Frequency of Communication with Friends and Family:   . Frequency of Social Gatherings with Friends and Family:   . Attends Religious Services:   . Active Member of Clubs or Organizations:   . Attends Banker Meetings:   Marland Kitchen Marital Status:     Vitals:   04/17/19 1431  BP: (!) 132/92  Pulse: 73  Resp: 16  Temp: 98 F (36.7 C)  SpO2: 99%   Wt Readings from Last 3 Encounters:  04/17/19 211 lb (95.7 kg)  03/30/19 214 lb (97.1 kg)  03/11/19 216 lb (98 kg)    Body mass index is 35.11 kg/m.  Physical Exam  Nursing note and vitals reviewed. Constitutional: She is oriented to person, place, and time. She appears well-developed. No distress.  HENT:  Head: Normocephalic and atraumatic.  Mouth/Throat: Oropharynx is clear and moist and mucous membranes are normal.  Eyes: Pupils are equal, round, and reactive to light. Conjunctivae are normal.  Cardiovascular: Normal rate and regular rhythm.  No murmur  heard. Pulses:      Dorsalis pedis pulses are 2+ on the right side and 2+ on the left side.  Respiratory: Effort normal and breath sounds normal. No respiratory distress.  GI: Soft. She exhibits no mass. There is no hepatomegaly. There is no abdominal tenderness.  Musculoskeletal:        General: No edema.  Lymphadenopathy:    She has no cervical adenopathy.  Neurological: She is alert and oriented to person, place, and time. She has normal strength. No cranial nerve deficit. Gait normal.  Skin: Skin is warm. No rash noted. No erythema.  Psychiatric: She has a normal mood and affect.  Well groomed, good eye contact.    ASSESSMENT AND PLAN:   Ms. Tatem was seen today for follow-up.  Diagnoses and all orders for this visit:  Recurrent genital herpes Continue Valtrex 500 mg bid x 3 days as needed. If flare ups become more frequent we can consider daily Valtrex x 6 months.  -     valACYclovir (VALTREX) 500 MG tablet; TAKE 1 TABLET (500 MG TOTAL) BY MOUTH 2 (TWO) TIMES DAILY. FOR 3 DAYS WITH ACUTE EPISODES.  Essential hypertension BP re-checked: 138/99. Continue HCTZ 25 mg. Losartan 25 mg added today. Side effects of medications discussed. She has an appt with her rheumatologist in a few days,she is supposed to have labs done,BMP in 1-2 weeks. Recommend monitoring BP regularly and continue low salt diet.  -     losartan (COZAAR) 25 MG tablet; Take 1 tablet (25 mg total) by mouth daily.  Class 2 severe obesity due to excess calories with serious comorbidity and body mass index (BMI) of 35.0 to 35.9 in adult [facility]) We discussed benefits of wt loss as well as adverse effects of obesity. Encouraged to be consistent with healthful diet and physical activity.  Gastroesophageal reflux  disease, unspecified whether esophagitis present Problem is better controlled. Continue Protonix 40 mg bid. Keep appt with GI. GERD precautions discussed.  Sickle cell trait (HCC) Low impact  regular physical activity recommended.  Return in about 5 months (around 09/17/2019).   Akeyla Molden G. [place], MD  [facility]. [place] office.  A few things to remember from today's visit:  Losartan 25 mg added today. No changes in HCTZ. BMP will be done at your rheumatologist's office,let me know if you does not.  Monitor blood pressure at home.  Please be sure medication list is accurate. If a new problem present, please set up appointment sooner than planned today.

## 2019-04-22 ENCOUNTER — Other Ambulatory Visit: Payer: Self-pay | Admitting: Family Medicine

## 2019-04-22 DIAGNOSIS — K219 Gastro-esophageal reflux disease without esophagitis: Secondary | ICD-10-CM

## 2019-04-24 NOTE — Progress Notes (Signed)
Office Visit Note  [name]             Date of Birth: 09-07-1960           MRN: [id number]             PCP: [place], [name], MD Referring: [place], [name], MD Visit Date: 04/26/2019 Occupation: @GUAROCC @  Subjective:  Other (patient reports being on antibiotics and antibiotic eye drops for a bacterial infection in her tear duct)   History of Present Illness: Patricia Reed is a 59 y.o. female with history of rheumatoid arthritis, osteoarthritis and fibromyalgia.  She states she was on medical leave for about 4 weeks due to increased fatigue and fibromyalgia flare.  She also developed left eye lacrimal duct infection.  She is taking oral antibiotics and also using antibiotic eyedrops.  She did not stop her medications.  She states the infection is clearing up.  She denies any joint swelling.  She continues to have a lot of fatigue from fibromyalgia.  She states her work has been very stressful and she decided to retire in June.  Activities of Daily Living:  Patient reports morning stiffness for 45 minutes.   Patient Reports nocturnal pain.  Difficulty dressing/grooming: Denies Difficulty climbing stairs: Denies Difficulty getting out of chair: Denies Difficulty using hands for taps, buttons, cutlery, and/or writing: Reports  Review of Systems  Constitutional: Positive for fatigue. Negative for night sweats, weight gain and weight loss.  HENT: Positive for mouth dryness. Negative for mouth sores, trouble swallowing, trouble swallowing and nose dryness.   Eyes: Positive for redness. Negative for pain, visual disturbance and dryness.  Respiratory: Negative for cough, shortness of breath and difficulty breathing.   Cardiovascular: Negative for chest pain, palpitations, hypertension, irregular heartbeat and swelling in legs/feet.  Gastrointestinal: Positive for constipation. Negative for blood in stool and diarrhea.  Endocrine: Negative for increased urination.   Genitourinary: Negative for difficulty urinating and vaginal dryness.  Musculoskeletal: Positive for arthralgias, joint pain, myalgias, morning stiffness, muscle tenderness and myalgias. Negative for joint swelling and muscle weakness.  Skin: Negative for color change, rash, hair loss, redness, skin tightness, ulcers and sensitivity to sunlight.  Allergic/Immunologic: Negative for susceptible to infections.  Neurological: Negative for dizziness, numbness, headaches, memory loss, night sweats and weakness.  Hematological: Negative for swollen glands.  Psychiatric/Behavioral: Negative for depressed mood, confusion and sleep disturbance. The patient is not nervous/anxious.     PMFS History:  Patient Active Problem List   Diagnosis Date Noted  . Persistent vomiting   . Abdominal pain, epigastric   . Nausea & vomiting 03/11/2019  . Gastritis   . Nausea without vomiting 02/28/2019  . Class 2 obesity with body mass index (BMI) of 37.0 to 37.9 in adult 11/25/2018  . Mixed connective tissue disease (HCC) 08/14/2017  . Recurrent genital herpes 05/12/2017  . Shortness of breath 04/15/2017  . Dizziness 02/04/2017  . Situational anxiety 06/08/2016  . Atypical chest pain 06/08/2016  . Trapezius muscle spasm 04/23/2016  . Fibromyalgia 02/13/2016  . 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 Chronic fatigue 02/13/2016  . Vitamin D deficiency 11/25/2015  . Autoimmune disease (HCC) 11/23/2015  . High risk medication use 11/23/2015  . Colon cancer screening 01/25/2014  . Anterior neck pain 12/01/2013  . Inflammatory arthritis 08/18/2012  . Allergic rhinitis 06/23/2012  . HTN (hypertension) 06/23/2012  . High cholesterol 06/23/2012  . Thyroid nodule - benign 06/23/2012  . Arthralgia 06/23/2012  . Mild intermittent asthma   .  Sickle cell trait ([place])   . History of stomach ulcers   . ESOPHAGEAL STRICTURE 10/04/2007  . GERD 10/04/2007  . 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 Dysphagia, pharyngoesophageal phase  10/04/2007    Past Medical History:  Diagnosis Date  . Allergy   . Anemia   . Asthma   . Blood transfusion without reported diagnosis   . Fibromyalgia   . GERD (gastroesophageal reflux disease)   . Heart murmur   . High cholesterol   . History of stomach ulcers   . Hypertension   . Lupus ([place])   . MI, old   . Rheumatoid arthritis ([place])   . Thyroid disease   . Thyroid mass   . Vertigo     Family History  Problem Relation Age of Onset  . Alcohol abuse Father   . Hypertension Father   . Heart disease Father   . Other Mother        tobacco use disorder, refuses to see a doctor  . Osteoporosis Sister   . Colon cancer Maternal Uncle   . Diabetes Maternal Grandmother   . Healthy Son   . Healthy Daughter   . Multiple sclerosis Daughter   . Colon polyps Neg Hx   . Esophageal cancer Neg Hx   . Kidney disease Neg Hx   . Stomach cancer Neg Hx   . Rectal cancer Neg Hx    Past Surgical History:  Procedure Laterality Date  . ABDOMINAL HYSTERECTOMY     done for menorrhagia, ovaries remain  . CERVICAL DISCECTOMY     plates and screws in neck  . CESAREAN SECTION    . COLONOSCOPY    . ESOPHAGEAL MANOMETRY N/A 02/12/2014   Procedure: ESOPHAGEAL MANOMETRY (EM);  Surgeon: Inda Castle, MD;  Location: [facility];  Service: Endoscopy;  Laterality: N/A;  . ESOPHAGOGASTRODUODENOSCOPY  03/04/2011   Procedure: ESOPHAGOGASTRODUODENOSCOPY (EGD);  Surgeon: Inda Castle, MD;  Location: [facility];  Service: Endoscopy;  Laterality: N/A;  BOTOX Injection  . ESOPHAGOGASTRODUODENOSCOPY (EGD) WITH PROPOFOL N/A 03/12/2019   Procedure: ESOPHAGOGASTRODUODENOSCOPY (EGD) WITH PROPOFOL;  Surgeon: Carol Ada, MD;  Location: [facility];  Service: Endoscopy;  Laterality: N/A;  . exc benign breast lump    . TONSILLECTOMY    . 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      Immunization History  Administered Date(s) Administered  . Influenza, Quadrivalent, Recombinant, Inj, Pf 10/11/2017  . Influenza,inj,Quad PF,6+ Mos 11/04/2015, 10/21/2018  . PFIZER SARS-COV-2 Vaccination 03/20/2019, 04/12/2019  . Zoster Recombinat (Shingrix) 10/11/2017, 12/25/2017     Objective: Vital Signs: BP (!) 132/92 (BP Location: Left Arm, Patient Position: Sitting, Cuff Size: Normal)   Pulse 90   Resp 16   Ht 5\' 5"  (1.651 m)   Wt 212 lb (96.2 kg)   BMI 35.28 kg/m    Physical Exam Vitals and nursing note reviewed.  Constitutional:      Appearance: She is well-developed.  HENT:     Head: Normocephalic and atraumatic.  Eyes:     Conjunctiva/sclera: Conjunctivae normal.  Cardiovascular:     Rate and Rhythm: Normal rate and regular rhythm.     Heart sounds: Normal heart sounds.  Pulmonary:     Effort: Pulmonary effort is normal.     Breath sounds: Normal breath sounds.  Abdominal:     General: Bowel sounds are normal.  Palpations: Abdomen is soft.  Musculoskeletal:     Cervical back: Normal range of motion.  Lymphadenopathy:     Cervical: No cervical adenopathy.  Skin:    General: Skin is warm and dry.     Capillary Refill: Capillary refill takes less than 2 seconds.  Neurological:     Mental Status: She is alert and oriented to person, place, and time.  Psychiatric:        Behavior: Behavior normal.      Musculoskeletal Exam: She has limited range of motion of her cervical spine.  She has discomfort with raising her arms mostly due to muscle pain.  Elbow joints and wrist joints with good range of motion.  No MCP swelling or synovitis was noted.  Mild DIP and PIP thickening was noted.  Hip joints and knee joints in good range of motion.  Ankle joints and MTPs with good range of motion with no tenderness.  She had positive tender points and hyperalgesia.  CDAI Exam: CDAI Score: 0.4  Patient Global: 2 mm; Provider Global: 2 mm Swollen: 0 ; Tender: 0  Joint  Exam 04/26/2019   No joint exam has been documented for this visit   There is currently no information documented on the homunculus. Go to the Rheumatology activity and complete the homunculus joint exam.  Investigation: No additional findings.  Imaging: No results found.  Recent Labs: Lab Results  Component Value Date   WBC 3.7 (L) 04/07/2019   HGB 13.3 04/07/2019   PLT 318 04/07/2019   NA 143 04/07/2019   K 3.5 04/07/2019   CL 98 04/07/2019   CO2 34 (H) 04/07/2019   GLUCOSE 98 04/07/2019   BUN 11 04/07/2019   CREATININE 0.82 04/07/2019   BILITOT 0.8 04/07/2019   ALKPHOS 66 03/11/2019   AST 19 04/07/2019   ALT 21 04/07/2019   PROT 7.6 04/07/2019   ALBUMIN 3.9 03/11/2019   CALCIUM 9.4 04/07/2019   GFRAA 93 04/07/2019   QFTBGOLDPLUS NEGATIVE 07/26/2018    Speciality Comments: No specialty comments available.  Procedures:  No procedures performed Allergies: Aspirin, Demerol [meperidine], Hydrocodone-acetaminophen, Ibuprofen, Imdur [isosorbide nitrate], Meperidine hcl, Morphine, Oxycodone-acetaminophen, Procaine hcl, Toprol xl [metoprolol], and Tramadol   Assessment / Plan:     Visit Diagnoses: Mixed connective tissue disease (HCC) - +ANA,+Sm,+RNP,+RF, arthritis -she is clinically doing well with no synovitis on examination.  She will be returning at the end of the month for repeat CBC due to neutropenia.  We will obtain additional labs at the time.  Plan: ANA, RNP Antibody, Anti-Smith antibody, Anti-DNA antibody, double-stranded, C3 and C4, Sedimentation rate, Urinalysis, Routine w reflex microscopic  High risk medication use - Xeljanz XR 11 mg 1 tablet by mouth daily, methotrexate 0.6 ml every 7 days, and folic acid 1 mg 2 tablets daily. - Plan: CBC with Differential/Platelet  Chronic pain of both shoulders - injected 01/23/2019.  Doing better.  Fibromyalgia-she continues to have some generalized pain discomfort and positive tender points.  Primary insomnia-she has  chronic insomnia.  She states she has been under a lot of stress.  She took medical leave for about 4 weeks.  She is retiring in June.  Chronic fatigue-due to fibromyalgia and stress.  Left eye lacrimal duct infection per patient-have advised her to follow-up with ophthalmologist.  If she has lingering infection she will have to stop her medications until she recovers.  Her conjunctiva was clear today.  Other medical problems are listed as follows:  History of vitamin  D deficiency  History of stomach ulcers  History of high cholesterol  History of gastroesophageal reflux (GERD)  History of hypertension  History of asthma  Orders: Orders Placed This Encounter  Procedures  . CBC with Differential/Platelet  . ANA  . RNP Antibody  . Anti-Smith antibody  . Anti-DNA antibody, double-stranded  . C3 and C4  . Sedimentation rate  . Urinalysis, Routine w reflex microscopic   No orders of the defined types were placed in this encounter.    Follow-Up Instructions: Return in about 5 months (around 09/26/2019) for MCTD.  We will call her with the lab results when available.   Pollyann Savoy, MD  Note - This record has been created using Animal nutritionist.  Chart creation errors have been sought, but may not always  have been located. Such creation errors do not reflect on  the standard of medical care.

## 2019-04-26 ENCOUNTER — Encounter: Payer: Self-pay | Admitting: Rheumatology

## 2019-04-26 ENCOUNTER — Other Ambulatory Visit: Payer: Self-pay

## 2019-04-26 ENCOUNTER — Ambulatory Visit (INDEPENDENT_AMBULATORY_CARE_PROVIDER_SITE_OTHER): Payer: BC Managed Care – PPO | Admitting: Rheumatology

## 2019-04-26 VITALS — BP 132/92 | HR 90 | Resp 16 | Ht 65.0 in | Wt 212.0 lb

## 2019-04-26 DIAGNOSIS — G8929 Other chronic pain: Secondary | ICD-10-CM

## 2019-04-26 DIAGNOSIS — Z8709 Personal history of other diseases of the respiratory system: Secondary | ICD-10-CM

## 2019-04-26 DIAGNOSIS — Z8711 Personal history of peptic ulcer disease: Secondary | ICD-10-CM

## 2019-04-26 DIAGNOSIS — M25511 Pain in right shoulder: Secondary | ICD-10-CM

## 2019-04-26 DIAGNOSIS — M351 Other overlap syndromes: Secondary | ICD-10-CM

## 2019-04-26 DIAGNOSIS — M25512 Pain in left shoulder: Secondary | ICD-10-CM

## 2019-04-26 DIAGNOSIS — Z8639 Personal history of other endocrine, nutritional and metabolic disease: Secondary | ICD-10-CM

## 2019-04-26 DIAGNOSIS — M797 Fibromyalgia: Secondary | ICD-10-CM | POA: Diagnosis not present

## 2019-04-26 DIAGNOSIS — F5101 Primary insomnia: Secondary | ICD-10-CM

## 2019-04-26 DIAGNOSIS — Z79899 Other long term (current) drug therapy: Secondary | ICD-10-CM

## 2019-04-26 DIAGNOSIS — Z8719 Personal history of other diseases of the digestive system: Secondary | ICD-10-CM

## 2019-04-26 DIAGNOSIS — Z8679 Personal history of other diseases of the circulatory system: Secondary | ICD-10-CM

## 2019-04-26 DIAGNOSIS — R5382 Chronic fatigue, unspecified: Secondary | ICD-10-CM

## 2019-04-26 NOTE — Telephone Encounter (Signed)
Rec'd completed forms - fwd to [facility] via interoffice mail -pr  °

## 2019-04-26 NOTE — Patient Instructions (Addendum)
Standing Labs We placed an order today for your standing lab work.    Please come back and get your standing labs on May 17, 2019 and then every 3 months  We have open lab daily Monday through Thursday from 8:30-12:30 PM and 1:30-4:30 PM and Friday from 8:30-12:30 PM and 1:30-4:00 PM at the office of Dr. Pollyann Savoy.   You may experience shorter wait times on Monday and Friday afternoons. The office is located at [address], [place], [place] [address] No appointment is necessary.   Labs are drawn by [employer].  You may receive a bill from [facility] for your lab work.  If you wish to have your labs drawn at another location, please call the office 24 hours in advance to send orders.  If you have any questions regarding directions or hours of operation,  please call [phone].   Just as a reminder please drink plenty of water prior to coming for your lab work. Thanks!   TB Gold is due in July 2021

## 2019-04-27 ENCOUNTER — Encounter: Payer: Self-pay | Admitting: Nurse Practitioner

## 2019-04-27 ENCOUNTER — Other Ambulatory Visit: Payer: Self-pay

## 2019-04-27 ENCOUNTER — Ambulatory Visit (INDEPENDENT_AMBULATORY_CARE_PROVIDER_SITE_OTHER): Payer: BC Managed Care – PPO | Admitting: Nurse Practitioner

## 2019-04-27 VITALS — BP 136/96 | HR 72 | Temp 96.8°F | Ht 65.0 in | Wt 213.4 lb

## 2019-04-27 DIAGNOSIS — R112 Nausea with vomiting, unspecified: Secondary | ICD-10-CM

## 2019-04-27 NOTE — Progress Notes (Signed)
04/27/2019 Patricia Reed [id number] 1960/08/09   Chief Complaint:  Hospital follow up, upper abdominal pain   History of Present Illness: Patricia Reed is a 58 year old female with a past medical history of asthma, hypertnesion, CAD, MI, Lupus, anemia, sickle cell trait, fibromyalgia, rheumatoid arthritis, GERD, esophageal stricture s/p botox injections 2013 and UGI bleed secondary to a large gastric ulcer in 1996. I saw the patient in office on 02/28/2019 with complaints of having nausea, stomach irritation and black stools. She was concerned she might have another stomach ulcer. She noted her stomach discomfort started 2 to 4 weeks after she began taking Amlodipine and Benzapril for hypertension.  A rectal exam showed brown stool that was guaiac negative. She underwent an EGD by Dr. Myrtie Neither 03/21/2018 which was normal. Hg 12.4 up from 11.4. Protonix 40mg  was increased to bid for 2 weeks. An abdominal sonogram 2/09 showed a normal gallbladder, a right liver cyst and hepatic steatosis. No further black stools therefore an EGD was deferred.  She presented to [facility] ED on 03/11/19 with complaints of epigastric pain and intermittent vomiting of partially digested food for 2 weeks. No hematemesis. Hg 12.9 T. Bili 1.3. AST 20. ALT 16.  She underwent an EGD 03/12/2019 by Dr. 03/14/2019 which showed a large hiatal hernia.  An upper GI series 03/13/2019 showed mildly sluggish esophageal motility. She received IV PPI, IVF, clear liquid diet, antiemetics and her symptoms significantly improved. She was discharged home on 03/14/2019. She was seen by her PCP and Amlodipine  and Benzapril were discontinued. She was then prescribed Losartan 25mg  daily which she is tolerating well. No further nausea or stomach pain since stopping Amlodipine and Benzapril. She denies having upper or lower abdominal pain. She is passing a normal brown formed stool most days. No further black stools. She does report having chronic esophageal  motility symptoms described as food and liquids slowly pass down her esophagus which has been stable for the past 10 years. Food does not get stuck in the esophagus. She underwent a normal esophageal manometry in 2016. She remains on Pantoprazole 40mg  po bid and Famotidine 20mg  Q HS.   CBC Latest Ref Rng & Units 04/07/2019 03/13/2019 03/12/2019  WBC 3.8 - 10.8 Thousand/uL 3.7(L) 4.2 4.4  Hemoglobin 11.7 - 15.5 g/dL 11.2(L) 12.2  Hematocrit 35.0 - 45.0 % 42.2 36.1 38.7  Platelets 140 - 400 Thousand/uL 318 276 298   CMP Latest Ref Rng & Units 04/07/2019 03/14/2019 03/13/2019  Glucose 65 - 99 mg/dL 98 54.0) 04/09/2019)  BUN 7 - 25 mg/dL 11 03/16/2019) 6  Creatinine 0.50 - 1.05 mg/dL 03/15/2019 086(P 619(J  Sodium 135 - 146 mmol/L 143 139 139  Potassium 3.5 - 5.3 mmol/L 3.5 4.4 3.6  Chloride 98 - 110 mmol/L 98 108 109  CO2 20 - 32 mmol/L 34(H) 23 24  Calcium 8.6 - 10.4 mg/dL 9.4 <0(D) 8.3(L)  Total Protein 6.1 - 8.1 g/dL 7.6 - -  Total Bilirubin 0.2 - 1.2 mg/dL 0.8 - -  Alkaline Phos 38 - 126 U/L - - -  AST 10 - 35 U/L 19 - -  ALT 6 - 29 U/L 21 - -   EGD 03/12/2019 by Dr. 7.12: - Large hiatal hernia. The patient appears to have a Type III hiatal hernia. There was anatomic distortion when initially evaluated and the endoscope need to be passed along the lesser curvature to reach the distal gastric lumen. - Normal stomach. - Normal examined  duodenum. - No specimens collected.  EGD 03/21/2018 by Dr. Loletha Carrow: - Normal esophagus. - Normal stomach. - Normal examined duodenum. - No specimens collected.  EGD 03/27/3014 by Dr. Deatra Ina: Normal.   In addition, she had undergone EGD with LES Botox injection by Dr. Deatra Ina in 2013. There are numerous EGD reports to that dating back to1996, when the patient had a large gastric ulcer  Esophageal manometry 02/12/2014: Normal.   Colonoscopy 02/16/2014: 1. Mild diverticulosis was noted 2. Internal hemorrhoids 3. The examination was otherwise normal Repeat  colonoscopy in 10 years  ECHO 04/22/2017: LV EF: 60% -  65%     Current Outpatient Medications on File Prior to Visit  Medication Sig Dispense Refill  . acetaminophen (TYLENOL) 650 MG CR tablet Take 650 mg by mouth every 8 (eight) hours as needed for pain.     Marland Kitchen albuterol (PROVENTIL) (2.5 MG/3ML) 0.083% nebulizer solution Take 3 mLs (2.5 mg total) by nebulization every 6 (six) hours as needed for wheezing or shortness of breath. 75 mL 12  . albuterol (VENTOLIN HFA) 108 (90 Base) MCG/ACT inhaler Inhale 2 puffs into the lungs every 6 (six) hours as needed for wheezing or shortness of breath. 6.7 g 1  . amoxicillin-clavulanate (AUGMENTIN) 875-125 MG tablet Take 1 tablet by mouth 2 (two) times daily.    Marland Kitchen aspirin 81 MG tablet Take 81 mg by mouth daily.    . B-D INS SYR ULTRAFINE 1CC/30G 30G X 1/2" 1 ML MISC     . baclofen (LIORESAL) 10 MG tablet TAKE 1 TABLET BY MOUTH AT BEDTIME AS NEEDED FOR MUSCLE SPASMS 30 tablet 0  . diclofenac sodium (VOLTAREN) 1 % GEL Apply 3 g to 3 large joints up to 3 times daily. (Patient taking differently: Apply 2 g topically daily as needed (pain). ) 3 Tube 3  . famotidine (PEPCID) 20 MG tablet TAKE 1 TO 2 TABLETS BY MOUTH AT BEDTIME 60 tablet 1  . fluticasone furoate-vilanterol (BREO ELLIPTA) 100-25 MCG/INH AEPB Inhale 1 puff into the lungs daily. 28 each 0  . folic acid (FOLVITE) 1 MG tablet TAKE 2 TABLETS (2 MG TOTAL) BY MOUTH DAILY. (Patient taking differently: Take 1 mg by mouth 2 (two) times daily. ) 180 tablet 3  . hydrochlorothiazide (HYDRODIURIL) 25 MG tablet Take 1 tablet (25 mg total) by mouth daily. 30 tablet 3  . losartan (COZAAR) 25 MG tablet Take 1 tablet (25 mg total) by mouth daily. 90 tablet 1  . methotrexate 50 MG/2ML injection INJECT 0.6 MLS (15 MG TOTAL) INTO THE SKIN ONCE A WEEK. 8 mL 0  . moxifloxacin (VIGAMOX) 0.5 % ophthalmic solution Apply 1 drop to eye 3 (three) times daily.    . Multiple Vitamins-Minerals (ZINC PO) Take by mouth daily.      . Omega-3 Fatty Acids (FISH OIL) 1000 MG CAPS Take by mouth.    . ondansetron (ZOFRAN) 4 MG tablet Take 1 tablet (4 mg total) by mouth every 8 (eight) hours as needed for nausea or vomiting. 20 tablet 0  . pantoprazole (PROTONIX) 40 MG tablet TAKE 1 TABLET BY MOUTH TWICE A DAY 60 tablet 0  . predniSONE (DELTASONE) 5 MG tablet Take 2.5 mg by mouth daily.     Marland Kitchen senna-docusate (SENOKOT-S) 8.6-50 MG tablet Take 1 tablet by mouth 2 (two) times daily.    . Tuberculin-Allergy Syringes (ALLERGY SYRINGE 1CC/27GX1/2") 27G X 1/2" 1 ML MISC Patient to use to inject MTX weekly 12 each 3  . valACYclovir (VALTREX) 500  MG tablet TAKE 1 TABLET (500 MG TOTAL) BY MOUTH 2 (TWO) TIMES DAILY. FOR 3 DAYS WITH ACUTE EPISODES. 18 tablet 1  . vitamin B-12 (CYANOCOBALAMIN) 100 MCG tablet Take 100 mcg by mouth daily.    Marland Kitchen VITAMIN D, ERGOCALCIFEROL, PO Take 1 capsule by mouth daily.    [name] XR 11 MG TB24 Take 1 tablet by mouth daily.     No current facility-administered medications on file prior to visit.    Allergies  Allergen Reactions  . Aspirin Other (See Comments)    GI bleed  . Demerol [Meperidine]   . Hydrocodone-Acetaminophen Hives and Nausea And Vomiting  . Ibuprofen Other (See Comments)    GI bleed  . Imdur [Isosorbide Nitrate] Other (See Comments)    Reaction unknown  . Meperidine Hcl Hives and Nausea And Vomiting    Short term memory loss  . Morphine Hives and Nausea And Vomiting  . Oxycodone-Acetaminophen Hives and Nausea And Vomiting    Reaction unknown  . Procaine Hcl Other (See Comments)    Ineffective  . Toprol Xl [Metoprolol] Other (See Comments)    Reaction unknown  . Tramadol Itching    Current Medications, Allergies, Past Medical History, Past Surgical History, Family History and Social History were reviewed in [employer] record.   Physical Exam: BP (!) 136/96   Pulse 72   Temp (!) 96.8 F (36 C)   Ht 5\' 5"  (1.651 m)   Wt 213 lb 6 oz (96.8 kg)    BMI 35.51 kg/m  General: Well developed 59 year old female in no acute distress. Head: Normocephalic and atraumatic. Eyes: No scleral icterus. Conjunctiva pink . Ears: Normal auditory acuity.  Lungs: Clear throughout to auscultation. Heart: Regular rate and rhythm, no murmur. Abdomen: Soft, nontender and nondistended. No masses or hepatomegaly. Normal bowel sounds x 4 quadrants.  Rectal: Deferred.  Musculoskeletal: Symmetrical with no gross deformities. Extremities: No edema. Neurological: Alert oriented x 4. No focal deficits.  Psychological: Alert and cooperative. Normal mood and affect  Assessment and Recommendations:   27. 59 year old female with a history of GERD, history of a large gastric ulcer 1996 admitted to the hospital 03/11/2019 with epigastric pain and vomiting. EGD done by Dr. 03/13/2019 2/21 showed a large hiatal hernia with anatomic distortion. UGI showed mild esophageal dysmotility. N/V and epigastric pain resolved after Amlodipine and Benzapril discontinued.  -Continue Pantoprazole 40mg  bid and Famotidine 20mg  daily for now.  -Avoid spicy food and ascitic foods. Avoid eating within 3 hours of going to bed. -Patient to call our office if N/V and upper abdominal pain recurs -Follow up in office in 3 months, may require further esophageal dysmotility and hiatal hernia evaluation.   2. HTN, improving  -Continue follow up with PCP  3. Colon cancer screening, up to date -Next colonoscopy due 01/2024  4.  Rheumatoid arthritis on Xeljanz, Prednisone and Methotrexate  5.  History of CAD, past MI on ASA 81 mg daily

## 2019-04-27 NOTE — Telephone Encounter (Signed)
Ok to provide note

## 2019-04-27 NOTE — Telephone Encounter (Signed)
Received email from patient today   "Good Afternoon, I trust that thus note reaches you all in good spirits and great health.  Just a quick note.......if you could send me a release note back to work.  I return to work on Monday,  May 01, 2019. Please specify return to work NO RESTRICTIONS. Look forward to hearing from you. Thank you so much for your help and your prompt attention to this matter.  Have a great rest of your day."  Beth please advise on letter

## 2019-04-27 NOTE — Progress Notes (Signed)
____________________________________________________________  Attending physician addendum:  Thank you for sending this case to me. I have reviewed the entire note, and the outlined plan seems appropriate.  Curiously, the UGIS report does not indicate a hiatal hernia, nor is there 1 evident on the images.  We only have the several endoscopy photos to go by, but it is not clear to me there is a large hiatal hernia present.  If patient has recurrent symptoms, then CT abdomen (without pelvis) would be warranted to better define the anatomy.  Amada Jupiter, MD  ____________________________________________________________

## 2019-04-27 NOTE — Patient Instructions (Signed)
If you are age 57 or older, your body mass index should be between 23-30. Your Body mass index is 35.51 kg/m. If this is out of the aforementioned range listed, please consider follow up with your Primary Care Provider.  If you are age 72 or younger, your body mass index should be between 19-25. Your Body mass index is 35.51 kg/m. If this is out of the aformentioned range listed, please consider follow up with your Primary Care Provider.   Continue Pantoprazole 40 mg  Continue Famotidine   Avoid eating within 3 hours of going to bed.   Follow up with Dr. Myrtie Neither in 3 months.   Thank you,  Alcide Evener, NP

## 2019-04-28 NOTE — Telephone Encounter (Signed)
Called and spoke with patient. Letter to return back to work on 05/01/2019 with no restrictions has been sent through [facility] per patient request. She is going to print it off and take it to work with her on Monday.  Nothing further needed at this time.

## 2019-05-04 DIAGNOSIS — R072 Precordial pain: Secondary | ICD-10-CM | POA: Insufficient documentation

## 2019-05-04 DIAGNOSIS — Z7189 Other specified counseling: Secondary | ICD-10-CM | POA: Insufficient documentation

## 2019-05-04 NOTE — Progress Notes (Signed)
Cardiology Office Note   Date:  05/05/2019   ID:  [name], DOB 07-07-1960, MRN [id number]  PCP:  [name], [name], MD  Cardiologist:   Minus Breeding, MD   Chief Complaint  Patient presents with  . Shortness of Breath      History of Present Illness: Patricia Reed is a 59 y.o. female who I first saw for  evaluation of end organ involvement to lupus.  She was referred by Dr. Estanislado Pandy.   I saw her in 2014 for chest pain.  She had had a cath by another cardiology group.  This demonstrated normal coronaries.  When I saw her I ordered an echo which demonstrated NSR and an EF of 65%.  There were no valvular abnormalities.    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  She has had flares of her lupus because of this.  She is going to retire in November.  She has been given some medical leave that has helped a lot.  She was having more shortness of breath until the stress resolved.  She was in the hospital in February and I reviewed these notes for this visit.  She had nausea and vomiting.  She thought maybe this was related to some of her medicines for her blood pressure and so her Norvasc and benazepril were stopped and she was started on losartan.  She is done well with this.  With less stress she thinks her breathing is better.  She is denying any PND or orthopnea.  She is not having any chest pressure, neck or arm discomfort.  She is having no weight gain or edema.   Past Medical History:  Diagnosis Date  . Allergy   . Anemia   . Asthma   . Blood transfusion without reported diagnosis   . Fibromyalgia   . GERD (gastroesophageal reflux disease)   . Heart murmur   . High cholesterol   . History of stomach ulcers   . Hypertension   . Lupus ([place])   . MI, old   . Rheumatoid arthritis ([place])   . Thyroid disease   . Thyroid mass   . Vertigo     Past Surgical History:  Procedure Laterality Date  . ABDOMINAL HYSTERECTOMY       done for menorrhagia, ovaries remain  . CERVICAL DISCECTOMY     plates and screws in neck  . CESAREAN SECTION    . COLONOSCOPY    . ESOPHAGEAL MANOMETRY N/A 02/12/2014   Procedure: ESOPHAGEAL MANOMETRY (EM);  Surgeon: Inda Castle, MD;  Location: [facility];  Service: Endoscopy;  Laterality: N/A;  . ESOPHAGOGASTRODUODENOSCOPY  03/04/2011   Procedure: ESOPHAGOGASTRODUODENOSCOPY (EGD);  Surgeon: Inda Castle, MD;  Location: [facility];  Service: Endoscopy;  Laterality: N/A;  BOTOX Injection  . ESOPHAGOGASTRODUODENOSCOPY (EGD) WITH PROPOFOL N/A 03/12/2019   Procedure: ESOPHAGOGASTRODUODENOSCOPY (EGD) WITH PROPOFOL;  Surgeon: Carol Ada, MD;  Location: [place];  Service: Endoscopy;  Laterality: N/A;  . exc benign breast lump    . TONSILLECTOMY    . UPPER GASTROINTESTINAL ENDOSCOPY       Current Outpatient Medications  Medication Sig Dispense Refill  . acetaminophen (TYLENOL) 650 MG CR tablet Take 650 mg by mouth every 8 (eight) hours as needed for pain.     Marland Kitchen albuterol (PROVENTIL) (2.5 MG/3ML) 0.083% nebulizer solution Take 3 mLs (2.5 mg total) by nebulization every 6 (six) hours as needed for  wheezing or shortness of breath. 75 mL 12  . albuterol (VENTOLIN HFA) 108 (90 Base) MCG/ACT inhaler Inhale 2 puffs into the lungs every 6 (six) hours as needed for wheezing or shortness of breath. 6.7 g 1  . amoxicillin-clavulanate (AUGMENTIN) 875-125 MG tablet Take 1 tablet by mouth 2 (two) times daily.    . B-D INS SYR ULTRAFINE 1CC/30G 30G X 1/2" 1 ML MISC     . baclofen (LIORESAL) 10 MG tablet TAKE 1 TABLET BY MOUTH AT BEDTIME AS NEEDED FOR MUSCLE SPASMS 30 tablet 0  . diclofenac sodium (VOLTAREN) 1 % GEL Apply 3 g to 3 large joints up to 3 times daily. (Patient taking differently: Apply 2 g topically daily as needed (pain). ) 3 Tube 3  . famotidine (PEPCID) 20 MG tablet TAKE 1 TO 2 TABLETS BY MOUTH AT BEDTIME 60 tablet 1  . fluticasone furoate-vilanterol (BREO ELLIPTA) 100-25  MCG/INH AEPB Inhale 1 puff into the lungs daily. 28 each 0  . folic acid (FOLVITE) 1 MG tablet TAKE 2 TABLETS (2 MG TOTAL) BY MOUTH DAILY. (Patient taking differently: Take 1 mg by mouth 2 (two) times daily. ) 180 tablet 3  . hydrochlorothiazide (HYDRODIURIL) 25 MG tablet Take 1 tablet (25 mg total) by mouth daily. 30 tablet 3  . losartan (COZAAR) 25 MG tablet Take 1 tablet (25 mg total) by mouth daily. 90 tablet 1  . methotrexate 50 MG/2ML injection INJECT 0.6 MLS (15 MG TOTAL) INTO THE SKIN ONCE A WEEK. 8 mL 0  . moxifloxacin (VIGAMOX) 0.5 % ophthalmic solution Apply 1 drop to eye 3 (three) times daily.    . Multiple Vitamins-Minerals (ZINC PO) Take by mouth daily.    . Omega-3 Fatty Acids (FISH OIL) 1000 MG CAPS Take by mouth.    . ondansetron (ZOFRAN) 4 MG tablet Take 1 tablet (4 mg total) by mouth every 8 (eight) hours as needed for nausea or vomiting. 20 tablet 0  . pantoprazole (PROTONIX) 40 MG tablet TAKE 1 TABLET BY MOUTH TWICE A DAY 60 tablet 0  . predniSONE (DELTASONE) 5 MG tablet Take 2.5 mg by mouth daily.     Marland Kitchen senna-docusate (SENOKOT-S) 8.6-50 MG tablet Take 1 tablet by mouth 2 (two) times daily.    . Tuberculin-Allergy Syringes (ALLERGY SYRINGE 1CC/27GX1/2") 27G X 1/2" 1 ML MISC Patient to use to inject MTX weekly 12 each 3  . valACYclovir (VALTREX) 500 MG tablet TAKE 1 TABLET (500 MG TOTAL) BY MOUTH 2 (TWO) TIMES DAILY. FOR 3 DAYS WITH ACUTE EPISODES. 18 tablet 1  . vitamin B-12 (CYANOCOBALAMIN) 100 MCG tablet Take 100 mcg by mouth daily.    Marland Kitchen VITAMIN D, ERGOCALCIFEROL, PO Take 1 capsule by mouth daily.    [name] XR 11 MG TB24 Take 1 tablet by mouth daily.     No current facility-administered medications for this visit.    Allergies:   Aspirin, Demerol [meperidine], Hydrocodone-acetaminophen, Ibuprofen, Imdur [isosorbide nitrate], Meperidine hcl, Morphine, Oxycodone-acetaminophen, Procaine hcl, Toprol xl [metoprolol], and Tramadol    ROS:  Please see the history of present  illness.   Otherwise, review of systems are positive for none.   All other systems are reviewed and negative.    PHYSICAL EXAM: VS:  BP 122/90   Pulse 75   Ht 5\' 5"  (1.651 m)   Wt 213 lb (96.6 kg)   SpO2 98%   BMI 35.45 kg/m  , BMI Body mass index is 35.45 kg/m. GENERAL:  Well appearing HEENT:  Pupils equal round and reactive, fundi not visualized, oral mucosa unremarkable NECK:  No jugular venous distention, waveform within normal limits, carotid upstroke brisk and symmetric, no bruits, no thyromegaly LYMPHATICS:  No cervical, inguinal adenopathy LUNGS:  Clear to auscultation bilaterally BACK:  No CVA tenderness CHEST:  Unremarkable HEART:  PMI not displaced or sustained,S1 and S2 within normal limits, no S3, no S4, no clicks, no rubs, no murmurs ABD:  Flat, positive bowel sounds normal in frequency in pitch, no bruits, no rebound, no guarding, no midline pulsatile mass, no hepatomegaly, no splenomegaly EXT:  2 plus pulses throughout, no edema, no cyanosis no clubbing SKIN:  No rashes no nodules NEURO:  Cranial nerves II through XII grossly intact, motor grossly intact throughout PSYCH:  Cognitively intact, oriented to person place and time    EKG:  EKG is ordered today. The ekg ordered today demonstrates sinus rhythm, rate 75, axis within limits, intervals within normal limits, no acute ST-T wave changes.   Recent Labs: 05/26/2018: TSH 0.59 03/14/2019: Magnesium 2.2 04/07/2019: ALT 21; BUN 11; Creat 0.82; Hemoglobin 13.3; Platelets 318; Potassium 3.5; Sodium 143    Lipid Panel    Component Value Date/Time   CHOL 205 (H) 05/26/2018 1034   TRIG 111 05/26/2018 1034   HDL 62 05/26/2018 1034   CHOLHDL 3.3 05/26/2018 1034   LDLCALC 121 (H) 05/26/2018 1034      Wt Readings from Last 3 Encounters:  05/05/19 213 lb (96.6 kg)  04/27/19 213 lb 6 oz (96.8 kg)  04/26/19 212 lb (96.2 kg)      Other studies Reviewed: Additional studies/ records that were reviewed today  include: Hospital admission February. Review of the above records demonstrates:  Please see elsewhere in the note.     ASSESSMENT AND PLAN:   DYSPNEA:    This seems to be improved.  She had an essentially unremarkable echo.  She had normal coronaries on existing cath.  At this point I don't see a cardiac etiology or any involvement from lupus.  No further work-up.   CHEST PAIN: She had no heart disease pain was atypical.  She is not having this currently.  She can stop her aspirin.  HTN: Blood pressures well controlled on the current regimen.  No change in therapy.  COVID EDUCATION: She has had a vaccine.   Current medicines are reviewed at length with the patient today.  The patient does not have concerns regarding medicines.  The following changes have been made:  As above  Labs/ tests ordered today include: None  Orders Placed This Encounter  Procedures  . EKG 12-Lead     Disposition:   FU with me as needed.      Signed, Rollene Rotunda, MD  05/05/2019 10:09 AM    [facility]

## 2019-05-05 ENCOUNTER — Encounter: Payer: Self-pay | Admitting: Cardiology

## 2019-05-05 ENCOUNTER — Other Ambulatory Visit: Payer: Self-pay

## 2019-05-05 ENCOUNTER — Ambulatory Visit (INDEPENDENT_AMBULATORY_CARE_PROVIDER_SITE_OTHER): Payer: BC Managed Care – PPO | Admitting: Cardiology

## 2019-05-05 VITALS — BP 122/90 | HR 75 | Ht 65.0 in | Wt 213.0 lb

## 2019-05-05 DIAGNOSIS — R072 Precordial pain: Secondary | ICD-10-CM

## 2019-05-05 DIAGNOSIS — R0600 Dyspnea, unspecified: Secondary | ICD-10-CM | POA: Diagnosis not present

## 2019-05-05 DIAGNOSIS — Z7189 Other specified counseling: Secondary | ICD-10-CM

## 2019-05-05 NOTE — Patient Instructions (Signed)
Medication Instructions:  STOP ASPIRIN *If you need a refill on your cardiac medications before your next appointment, please call your pharmacy*  Lab Work: NONE ORDERED THIS VISIT  Testing/Procedures: NONE ORDERED THIS VISIT  Follow-Up: At [facility], you and your health needs are our priority.  As part of our continuing mission to provide you with exceptional heart care, we have created designated Provider Care Teams.  These Care Teams include your primary Cardiologist (physician) and Advanced Practice Providers (APPs -  Physician Assistants and Nurse Practitioners) who all work together to provide you with the care you need, when you need it.  Your next appointment:   FOLLOW UP AS NEEDED

## 2019-05-25 ENCOUNTER — Other Ambulatory Visit: Payer: Self-pay | Admitting: Nurse Practitioner

## 2019-05-25 ENCOUNTER — Other Ambulatory Visit: Payer: Self-pay | Admitting: Family Medicine

## 2019-05-25 DIAGNOSIS — K219 Gastro-esophageal reflux disease without esophagitis: Secondary | ICD-10-CM

## 2019-05-25 DIAGNOSIS — R1013 Epigastric pain: Secondary | ICD-10-CM

## 2019-05-25 DIAGNOSIS — K2940 Chronic atrophic gastritis without bleeding: Secondary | ICD-10-CM

## 2019-05-27 ENCOUNTER — Emergency Department (HOSPITAL_BASED_OUTPATIENT_CLINIC_OR_DEPARTMENT_OTHER): Payer: BC Managed Care – PPO

## 2019-05-27 ENCOUNTER — Other Ambulatory Visit: Payer: Self-pay

## 2019-05-27 ENCOUNTER — Encounter (HOSPITAL_BASED_OUTPATIENT_CLINIC_OR_DEPARTMENT_OTHER): Payer: Self-pay | Admitting: Emergency Medicine

## 2019-05-27 ENCOUNTER — Emergency Department (HOSPITAL_BASED_OUTPATIENT_CLINIC_OR_DEPARTMENT_OTHER)
Admission: EM | Admit: 2019-05-27 | Discharge: 2019-05-27 | Disposition: A | Discharge status: Home / Self Care | Payer: BC Managed Care – PPO | Attending: Emergency Medicine | Admitting: Emergency Medicine

## 2019-05-27 DIAGNOSIS — J45909 Unspecified asthma, uncomplicated: Secondary | ICD-10-CM | POA: Diagnosis not present

## 2019-05-27 DIAGNOSIS — R079 Chest pain, unspecified: Secondary | ICD-10-CM | POA: Insufficient documentation

## 2019-05-27 DIAGNOSIS — M797 Fibromyalgia: Secondary | ICD-10-CM | POA: Insufficient documentation

## 2019-05-27 DIAGNOSIS — I10 Essential (primary) hypertension: Secondary | ICD-10-CM | POA: Insufficient documentation

## 2019-05-27 DIAGNOSIS — Z79899 Other long term (current) drug therapy: Secondary | ICD-10-CM | POA: Insufficient documentation

## 2019-05-27 DIAGNOSIS — M3210 Systemic lupus erythematosus, organ or system involvement unspecified: Secondary | ICD-10-CM | POA: Diagnosis not present

## 2019-05-27 LAB — CBC
HCT: 38.9 % (ref 36.0–46.0)
Hemoglobin: 12.1 g/dL (ref 12.0–15.0)
MCH: 27.9 pg (ref 26.0–34.0)
MCHC: 31.1 g/dL (ref 30.0–36.0)
MCV: 89.8 fL (ref 80.0–100.0)
Platelets: 277 10*3/uL (ref 150–400)
RBC: 4.33 MIL/uL (ref 3.87–5.11)
RDW: 14.3 % (ref 11.5–15.5)
WBC: 4.3 10*3/uL (ref 4.0–10.5)
nRBC: 0 % (ref 0.0–0.2)

## 2019-05-27 LAB — COMPREHENSIVE METABOLIC PANEL
ALT: 15 U/L (ref 0–44)
AST: 19 U/L (ref 15–41)
Albumin: 3.9 g/dL (ref 3.5–5.0)
Alkaline Phosphatase: 53 U/L (ref 38–126)
Anion gap: 10 (ref 5–15)
BUN: 14 mg/dL (ref 6–20)
CO2: 29 mmol/L (ref 22–32)
Calcium: 8.9 mg/dL (ref 8.9–10.3)
Chloride: 100 mmol/L (ref 98–111)
Creatinine, Ser: 0.73 mg/dL (ref 0.44–1.00)
GFR calc Af Amer: >60 mL/min (ref >60)
GFR calc non Af Amer: >60 mL/min (ref >60)
Glucose, Bld: 100 mg/dL — ABNORMAL HIGH (ref 70–99)
Potassium: 3.3 mmol/L — ABNORMAL LOW (ref 3.5–5.1)
Sodium: 139 mmol/L (ref 135–145)
Total Bilirubin: 0.6 mg/dL (ref 0.3–1.2)
Total Protein: 7.8 g/dL (ref 6.5–8.1)

## 2019-05-27 LAB — TROPONIN I (HIGH SENSITIVITY): Troponin I (High Sensitivity): 4 ng/L (ref <18)

## 2019-05-27 MED ORDER — SODIUM CHLORIDE 0.9 % IV BOLUS
1000.0000 mL | Freq: Once | INTRAVENOUS | Status: AC
  Administered 2019-05-27: 1000 mL via INTRAVENOUS

## 2019-05-27 MED ORDER — FAMOTIDINE IN NACL 20-0.9 MG/50ML-% IV SOLN
20.0000 mg | Freq: Once | INTRAVENOUS | Status: AC
  Administered 2019-05-27: 20 mg via INTRAVENOUS
  Filled 2019-05-27: qty 50

## 2019-05-27 MED ORDER — PREDNISONE 10 MG PO TABS
40.0000 mg | ORAL_TABLET | Freq: Every day | ORAL | 0 refills | Status: DC
Start: 2019-05-27 — End: 2019-05-31

## 2019-05-27 MED ORDER — PREDNISONE 50 MG PO TABS
60.0000 mg | ORAL_TABLET | Freq: Once | ORAL | Status: AC
  Administered 2019-05-27: 60 mg via ORAL
  Filled 2019-05-27: qty 1

## 2019-05-27 MED ORDER — ALUM & MAG HYDROXIDE-SIMETH 200-200-20 MG/5ML PO SUSP
30.0000 mL | Freq: Once | ORAL | Status: AC
  Administered 2019-05-27: 30 mL via ORAL
  Filled 2019-05-27: qty 30

## 2019-05-27 MED ORDER — POTASSIUM CHLORIDE CRYS ER 20 MEQ PO TBCR
40.0000 meq | EXTENDED_RELEASE_TABLET | Freq: Once | ORAL | Status: AC
  Administered 2019-05-27: 40 meq via ORAL
  Filled 2019-05-27: qty 2

## 2019-05-27 MED ORDER — LIDOCAINE VISCOUS HCL 2 % MT SOLN
15.0000 mL | Freq: Once | OROMUCOSAL | Status: AC
  Administered 2019-05-27: 15 mL via ORAL
  Filled 2019-05-27: qty 15

## 2019-05-27 NOTE — Discharge Instructions (Addendum)
Your work-up today was very reassuring.  Your EKG, labs and chest x-ray are very reassuring.  As we discussed there is a very slight abnormality on your chest x-ray which may be an indication that your lupus may be flaring.  I recommend following up with your pulmonologist for this.  Also recommend touching base with your rheumatologist.  Please keep your primary care doctor appointment as I believe that he evaluated next week or so will be very helpful.  If you have any new or concerning symptoms please return to the emergency department as we discussed.  Please take the prednisone as prescribed if you are still taking daily prednisone please only take the prednisone that I prescribed for the next 5 days.  Then you may return to your normal schedule prednisone.

## 2019-05-27 NOTE — ED Triage Notes (Signed)
Intermittent chest pain x 1 week, worsening today.

## 2019-05-27 NOTE — ED Provider Notes (Signed)
[facility] EMERGENCY DEPARTMENT Provider Note   CSN: [id number] Arrival date & time: 05/27/19  1550     History Chief Complaint  Patient presents with  . Chest Pain    Camri AERALYN BARNA is a 59 y.o. female.  HPI Patient is a 59 year old female with past medical history significant for allergies, anemia, asthma, fibromyalgia, acid reflux, heart murmur  Patient is presenting today for 1 week of intermittent left-sided chest pain as well as some arm pain and neck pain.  She states that she has had symptoms intermittently for the past week which she states are not exacerbated by any exertion or coughing but rather by position.  She states that she sometimes is unable to get uncomfortable and has to lay on her left side in order for the symptoms to go away.  She denies any symptoms with exertion.  She denies any shortness of breath but does state that occasionally when she takes a deep breath she feels a sharp pain in her chest.  Patient denies any heart palpitations, hemoptysis, recent surgery or extended travel, unilateral leg swelling or calf tenderness.  Denies any history of blood clots.  She denies any oral hormone use that she does use topical estrogen for atrophic vaginitis.  HPI: A 59 year old patient with a history of obesity presents for evaluation of chest pain. Initial onset of pain was more than 6 hours ago. The patient's chest pain is sharp and is not worse with exertion. The patient's chest pain is middle- or left-sided, is not well-localized, is not described as heaviness/pressure/tightness and does radiate to the arms/jaw/neck. The patient does not complain of nausea and denies diaphoresis. The patient has no history of stroke, has no history of peripheral artery disease, has not smoked in the past 90 days, denies any history of treated diabetes, has no relevant family history of coronary artery disease (first degree relative at less than age 86), is not hypertensive  and has no history of hypercholesterolemia.   Past Medical History:  Diagnosis Date  . Allergy   . Anemia   . Asthma   . Blood transfusion without reported diagnosis   . Fibromyalgia   . GERD (gastroesophageal reflux disease)   . Heart murmur   . High cholesterol   . History of stomach ulcers   . Hypertension   . Lupus (HCC)   . MI, old   . Rheumatoid arthritis (HCC)   . Thyroid disease   . Thyroid mass   . Vertigo     Patient Active Problem List   Diagnosis Date Noted  . Precordial chest pain 05/04/2019  . Educated about COVID-19 virus infection 05/04/2019  . Persistent vomiting   . Abdominal pain, epigastric   . Nausea & vomiting 03/11/2019  . Gastritis   . Nausea without vomiting 02/28/2019  . Class 2 obesity with body mass index (BMI) of 37.0 to 37.9 in adult 11/25/2018  . Mixed connective tissue disease (HCC) 08/14/2017  . Recurrent genital herpes 05/12/2017  . Shortness of breath 04/15/2017  . Dizziness 02/04/2017  . Situational anxiety 06/08/2016  . Atypical chest pain 06/08/2016  . Trapezius muscle spasm 04/23/2016  . Fibromyalgia 02/13/2016  . 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 Chronic fatigue 02/13/2016  . Vitamin D deficiency 11/25/2015  . Autoimmune disease (HCC) 11/23/2015  . High risk medication use 11/23/2015  . Colon cancer screening 01/25/2014  . Anterior neck pain 12/01/2013  . Inflammatory arthritis 08/18/2012  . Allergic rhinitis 06/23/2012  . HTN (  hypertension) 06/23/2012  . High cholesterol 06/23/2012  . Thyroid nodule - benign 06/23/2012  . Arthralgia 06/23/2012  . Mild intermittent asthma   . Sickle cell trait (HCC)   . History of stomach ulcers   . ESOPHAGEAL STRICTURE 10/04/2007  . GERD 10/04/2007  . 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 Dysphagia, pharyngoesophageal phase 10/04/2007    Past Surgical History:  Procedure Laterality Date  . ABDOMINAL HYSTERECTOMY     done for menorrhagia, ovaries remain  . CERVICAL DISCECTOMY      plates and screws in neck  . CESAREAN SECTION    . COLONOSCOPY    . ESOPHAGEAL MANOMETRY N/A 02/12/2014   Procedure: ESOPHAGEAL MANOMETRY (EM);  Surgeon: Louis Meckel, MD;  Location: [facility] ENDOSCOPY;  Service: Endoscopy;  Laterality: N/A;  . ESOPHAGOGASTRODUODENOSCOPY  03/04/2011   Procedure: ESOPHAGOGASTRODUODENOSCOPY (EGD);  Surgeon: Louis Meckel, MD;  Location: [facility];  Service: Endoscopy;  Laterality: N/A;  BOTOX Injection  . ESOPHAGOGASTRODUODENOSCOPY (EGD) WITH PROPOFOL N/A 03/12/2019   Procedure: ESOPHAGOGASTRODUODENOSCOPY (EGD) WITH PROPOFOL;  Surgeon: Jeani Hawking, MD;  Location: [facility] ENDOSCOPY;  Service: Endoscopy;  Laterality: N/A;  . exc benign breast lump    . TONSILLECTOMY    . UPPER GASTROINTESTINAL ENDOSCOPY       OB History    Gravida  5   Para  3   Term  3   Preterm      AB      Living        SAB      TAB      Ectopic      Multiple      Live Births              Family History  Problem Relation Age of Onset  . Alcohol abuse Father   . Hypertension Father   . Heart disease Father   . Other Mother        tobacco use disorder, refuses to see a doctor  . Osteoporosis Sister   . Colon cancer Maternal Uncle   . Diabetes Maternal Grandmother   . Healthy Son   . Healthy Daughter   . Multiple sclerosis Daughter   . Colon polyps Neg Hx   . Esophageal cancer Neg Hx   . Kidney disease Neg Hx   . Stomach cancer Neg Hx   . Rectal cancer Neg Hx     Social History   Tobacco Use  . Smoking status: Never Smoker  . Smokeless tobacco: Never Used  Substance Use Topics  . Alcohol use: Yes    Alcohol/week: 1.0 standard drinks    Types: 1 Glasses of wine per week    Comment: occ  . Drug use: No    Home Medications Prior to Admission medications   Medication Sig Start Date End Date Taking? Authorizing Provider  acetaminophen (TYLENOL) 650 MG CR tablet Take 650 mg by mouth every 8 (eight) hours as needed for pain.     [provider]   albuterol (PROVENTIL) (2.5 MG/3ML) 0.083% nebulizer solution Take 3 mLs (2.5 mg total) by nebulization every 6 (six) hours as needed for wheezing or shortness of breath. 03/30/19   Glenford Bayley, NP  albuterol (VENTOLIN HFA) 108 (90 Base) MCG/ACT inhaler Inhale 2 puffs into the lungs every 6 (six) hours as needed for wheezing or shortness of breath. 08/19/18   Glenford Bayley, NP  amoxicillin-clavulanate (AUGMENTIN) 875-125 MG tablet Take 1 tablet by mouth 2 (two) times daily. 04/20/19  [provider]  B-D INS SYR ULTRAFINE 1CC/30G 30G X 1/2" 1 ML MISC  03/05/18   [provider]  baclofen (LIORESAL) 10 MG tablet TAKE 1 TABLET BY MOUTH AT BEDTIME AS NEEDED FOR MUSCLE SPASMS 03/17/19   [place], [name], MD  diclofenac sodium (VOLTAREN) 1 % GEL Apply 3 g to 3 large joints up to 3 times daily. Patient taking differently: Apply 2 g topically daily as needed (pain).  05/11/17   Gearldine Bienenstock, PA-C  famotidine (PEPCID) 20 MG tablet TAKE 1 TO 2 TABLETS BY MOUTH AT BEDTIME 05/26/19   [place], [name], MD  fluticasone furoate-vilanterol (BREO ELLIPTA) 100-25 MCG/INH AEPB Inhale 1 puff into the lungs daily. 03/30/19   Glenford Bayley, NP  folic acid (FOLVITE) 1 MG tablet TAKE 2 TABLETS (2 MG TOTAL) BY MOUTH DAILY. Patient taking differently: Take 1 mg by mouth 2 (two) times daily.  07/13/18   Pollyann Savoy, MD  hydrochlorothiazide (HYDRODIURIL) 25 MG tablet Take 1 tablet (25 mg total) by mouth daily. 03/29/19   [place], [name], MD  losartan (COZAAR) 25 MG tablet Take 1 tablet (25 mg total) by mouth daily. 04/17/19   [place], [name], MD  methotrexate 50 MG/2ML injection INJECT 0.6 MLS (15 MG TOTAL) INTO THE SKIN ONCE A WEEK. 03/17/19   Pollyann Savoy, MD  moxifloxacin (VIGAMOX) 0.5 % ophthalmic solution Apply 1 drop to eye 3 (three) times daily. 04/20/19   [provider]  Multiple Vitamins-Minerals (ZINC PO) Take by mouth daily.    [provider]  Omega-3 Fatty Acids  (FISH OIL) 1000 MG CAPS Take by mouth.    [provider]  ondansetron (ZOFRAN) 4 MG tablet Take 1 tablet (4 mg total) by mouth every 8 (eight) hours as needed for nausea or vomiting. 03/27/19   Deeann Saint, MD  pantoprazole (PROTONIX) 40 MG tablet TAKE 1 TABLET BY MOUTH TWICE A DAY 05/25/19   Arnaldo Natal, NP  predniSONE (DELTASONE) 10 MG tablet Take 4 tablets (40 mg total) by mouth daily. 05/27/19   Gailen Shelter, PA  predniSONE (DELTASONE) 5 MG tablet Take 2.5 mg by mouth daily.  04/05/18   [provider]  senna-docusate (SENOKOT-S) 8.6-50 MG tablet Take 1 tablet by mouth 2 (two) times daily. 03/14/19   Rodolph Bong, MD  Tuberculin-Allergy Syringes (ALLERGY SYRINGE 1CC/27GX1/2") 27G X 1/2" 1 ML MISC Patient to use to inject MTX weekly 08/21/16   Pollyann Savoy, MD  valACYclovir (VALTREX) 500 MG tablet TAKE 1 TABLET (500 MG TOTAL) BY MOUTH 2 (TWO) TIMES DAILY. FOR 3 DAYS WITH ACUTE EPISODES. 04/17/19   [place], [name], MD  vitamin B-12 (CYANOCOBALAMIN) 100 MCG tablet Take 100 mcg by mouth daily.    [provider]  VITAMIN D, ERGOCALCIFEROL, PO Take 1 capsule by mouth daily.    [provider]  XELJANZ XR 11 MG TB24 Take 1 tablet by mouth daily. 03/02/18   [provider]    Allergies    Aspirin, Demerol [meperidine], Hydrocodone-acetaminophen, Ibuprofen, Imdur [isosorbide nitrate], Meperidine hcl, Morphine, Oxycodone-acetaminophen, Procaine hcl, Toprol xl [metoprolol], and Tramadol  Review of Systems   Review of Systems  Constitutional: Negative for chills and fever.  HENT: Negative for congestion.   Eyes: Negative for pain.  Respiratory: Positive for chest tightness and shortness of breath. Negative for cough.   Cardiovascular: Positive for chest pain. Negative for leg swelling.  Gastrointestinal: Negative for abdominal pain and vomiting.  Genitourinary: Negative  for dysuria.  Musculoskeletal: Negative for myalgias.  Skin:  Negative for rash.  Neurological: Negative for dizziness and headaches.    Physical Exam Updated Vital Signs BP 98/86 (BP Location: Left Arm)   Pulse 78   Temp 97.8 F (36.6 C)   Resp 18   Ht 5\' 5"  (1.651 m)   Wt 95.7 kg   SpO2 98%   BMI 35.11 kg/m   Physical Exam Vitals and nursing note reviewed.  Constitutional:      General: She is not in acute distress.    Comments: Pleasant 59 year old female no acute distress.  Sitting comfortably in bed.  Able answer questions appropriately milligrams.  HENT:     Head: Normocephalic and atraumatic.     Nose: Nose normal.     Mouth/Throat:     Mouth: Mucous membranes are moist.  Eyes:     General: No scleral icterus. Cardiovascular:     Rate and Rhythm: Normal rate and regular rhythm.     Pulses: Normal pulses.     Heart sounds: Normal heart sounds.     Comments: Faint murmur present on auscultation  DP/PT/radial pulses palpated symmetric and 3+ Pulmonary:     Effort: Pulmonary effort is normal. No respiratory distress.     Breath sounds: No wheezing.  Abdominal:     Palpations: Abdomen is soft.     Tenderness: There is no abdominal tenderness.  Musculoskeletal:     Cervical back: Normal range of motion.     Right lower leg: No edema.     Left lower leg: No edema.  Skin:    General: Skin is warm and dry.     Capillary Refill: Capillary refill takes less than 2 seconds.  Neurological:     Mental Status: She is alert. Mental status is at baseline.  Psychiatric:        Mood and Affect: Mood normal.        Behavior: Behavior normal.     ED Results / Procedures / Treatments   Labs (all labs ordered are listed, but only abnormal results are displayed) Labs Reviewed  COMPREHENSIVE METABOLIC PANEL - Abnormal; Notable for the following components:      Result Value   Potassium 3.3 (*)    Glucose, Bld 100 (*)    All other components within normal limits  CBC  TROPONIN I (HIGH SENSITIVITY)    EKG EKG  Interpretation  Date/Time:  Saturday May 27 2019 15:52:27 EDT Ventricular Rate:  92 PR Interval:  170 QRS Duration: 86 QT Interval:  366 QTC Calculation: 452 R Axis:   12 Text Interpretation: Normal sinus rhythm Minimal voltage criteria for LVH, may be normal variant ( R in aVL ) Cannot rule out Anterior infarct , age undetermined No significant change since last tracing Confirmed by 05-30-1985 (Gwyneth Sprout) on 05/27/2019 5:13:37 PM   Radiology DG Chest 2 View  Result Date: 05/27/2019 CLINICAL DATA:  Chest pain EXAM: CHEST - 2 VIEW COMPARISON:  August 18, 2018 FINDINGS: There is mild scarring in the lateral left base. Lungs elsewhere are clear. Heart size and pulmonary vascularity are normal. No adenopathy. No pneumothorax. There is postoperative change in the lower cervical spine. IMPRESSION: Slight scarring lateral left base. Lungs otherwise clear. Stable cardiac silhouette. Electronically Signed   By: August 20, 2018 III M.D.   On: 05/27/2019 16:18    Procedures Procedures (including critical care time)  Medications Ordered in ED Medications  predniSONE (DELTASONE) tablet 60 mg (has no administration  in time range)  alum & mag hydroxide-simeth (MAALOX/MYLANTA) 200-200-20 MG/5ML suspension 30 mL (30 mLs Oral Given 05/27/19 1715)    And  lidocaine (XYLOCAINE) 2 % viscous mouth solution 15 mL (15 mLs Oral Given 05/27/19 1715)  sodium chloride 0.9 % bolus 1,000 mL (0 mLs Intravenous Stopped 05/27/19 1816)  famotidine (PEPCID) IVPB 20 mg premix (0 mg Intravenous Stopped 05/27/19 1811)  potassium chloride SA (KLOR-CON) CR tablet 40 mEq (40 mEq Oral Given 05/27/19 1807)    ED Course  I have reviewed the triage vital signs and the nursing notes.  Pertinent labs & imaging results that were available during my care of the patient were reviewed by me and considered in my medical decision making (see chart for details).  Patient is a 59 year old female with a past medical history that is significant  for lupus and several other diseases detailed above.  She has no cardiac history and has had normal cath several years ago.  She is followed by cardiology who saw her 2 weeks ago.  She has had echocardiogram done within the last 2 years that was without any abnormality.  He has a murmur which is unchanged over the past several years that prompted this echocardiogram.  Patient's chest pain is concerning for ACS VS PE, low suspicion for thoracic aortic dissection, myocarditis, pericarditis, pneumonia, thoracic aortic dissection, esophageal perforation.   More likely her symptoms are a result of lupus flare, anxiety, or other benign process.  Patient does have a history of reflux for that reason I will provide patient famotidine and GI cocktail in the interim while her blood work is pending.  Her blood pressure has been controlled with multiple medications but she was recently placed on hydrochlorothiazide.  Which likely accounts for her mildly low potassium.  Clinical Course as of May 26 1817  Sat May 27, 2019  1756 Mildly low potassium of 3.3 given oral repletion.  No other electrolyte abnormalities.  CBC without abnormality.  No leukocytosis or anemia.   [WF]    Clinical Course User Index [WF] Tedd Sias, [place]   Troponin X1 within normal limits.  Using a heart algorithm of  medical system she is eligible for discharge without second troponin she has also been having symptoms for over 3 hours.  CBC within normal limits no leukocytosis or anemia.  CMP without abnormality apart from hypokalemia.  Troponin X1 is normal.  Chest x-ray shows mild scarring in the left lower lung.  I discussed this abnormality with patient she is understanding of need to follow-up with her pulmonologist.  I low suspicion for this being the cause of her symptoms today however this may indicate flare of her lupus.  I discussed this case with my attending physician who cosigned this note including patient's  presenting symptoms, physical exam, and planned diagnostics and interventions. Attending physician stated agreement with plan or made changes to plan which were implemented.   Patient discharged with strict return precautions.  She is well-appearing at time of discharge is not tachycardic, not hypoxic and states that her symptoms are similar improved after GI cocktail.  I counseled patient that this is not necessarily reassuringly however I do believe that she is stable for discharge and reliable for return.  MDM Rules/Calculators/A&P HEAR Score: 2                    We will provide patient with prednisone burst for 5 days which was plan I discussed with Dr.  Plunkett in case this is a flare of her rheumatologic disease.  Final Clinical Impression(s) / ED Diagnoses Final diagnoses:  Chest pain, unspecified type    Rx / DC Orders ED Discharge Orders         Ordered    predniSONE (DELTASONE) 10 MG tablet  Daily     05/27/19 1818           Gailen Shelter, [place] 05/27/19 1826    Gwyneth Sprout, MD 05/29/19 0017

## 2019-05-30 ENCOUNTER — Other Ambulatory Visit: Payer: Self-pay

## 2019-05-31 ENCOUNTER — Ambulatory Visit (INDEPENDENT_AMBULATORY_CARE_PROVIDER_SITE_OTHER): Payer: BC Managed Care – PPO | Admitting: Family Medicine

## 2019-05-31 ENCOUNTER — Encounter: Payer: Self-pay | Admitting: Family Medicine

## 2019-05-31 ENCOUNTER — Telehealth: Payer: Self-pay | Admitting: Primary Care

## 2019-05-31 VITALS — BP 120/82 | HR 83 | Temp 97.2°F | Resp 12 | Ht 65.0 in | Wt 213.1 lb

## 2019-05-31 DIAGNOSIS — I10 Essential (primary) hypertension: Secondary | ICD-10-CM | POA: Diagnosis not present

## 2019-05-31 DIAGNOSIS — M797 Fibromyalgia: Secondary | ICD-10-CM

## 2019-05-31 DIAGNOSIS — E876 Hypokalemia: Secondary | ICD-10-CM

## 2019-05-31 DIAGNOSIS — R079 Chest pain, unspecified: Secondary | ICD-10-CM

## 2019-05-31 MED ORDER — DULOXETINE HCL 30 MG PO CPEP: 30.0000 mg | ORAL_CAPSULE | Freq: Every day | ORAL | 1 refills | Status: DC

## 2019-05-31 MED ORDER — BACLOFEN 10 MG PO TABS: ORAL_TABLET | ORAL | 3 refills | Status: DC

## 2019-05-31 NOTE — Telephone Encounter (Signed)
order number: [id number]

## 2019-05-31 NOTE — Progress Notes (Signed)
Patricia Reed is a 59 y.o.female, who is here today to follow on HTN and recent ER visit. Last follow up visit: 04/17/2019. Since her last visit she has been evaluated in the ER because of chest pain. Chest pain has resolved. She was discharged with a prescription for prednisone 20 mg daily, which she took for a few days, discontinued because decreased appetite. Currently she is on prednisone 2.5 mg daily, which does help with pain.  Hypertension: Currently she is on HCTZ 25 mg daily and Losartan 25 mg daily. She is not checking BP regularly. During recent ER visit BP was 98/86.  Tolerating medication well.  She has not noted unusual headache, visual changes, exertional chest pain, dyspnea,  focal weakness, or edema.  Lab Results  Component Value Date   CREATININE 0.73 05/27/2019   BUN 14 05/27/2019   NA 139 05/27/2019   K 3.3 (L) 05/27/2019   CL 100 05/27/2019   CO2 29 05/27/2019   + Myalgias and arthralgias. History of fibromyalgia and mixed connective tissue disease. She is on Methotrexate, chronic prednisone treatment, and Xeljanz XR 11 mg daily.  She follows with a rheumatologist regularly. Musculoskeletal pain is alleviated by resting and exacerbated by a stress and certain activities. She has not noted joint edema or erythema.  She does not sleep well because muscle aches, Baclofen helped.  She is excited because she is retiring in 11/2019. Body aches and arthralgias are getting worse as she gets older.  Review of Systems  Constitutional: Positive for fatigue. Negative for activity change, appetite change and fever.  HENT: Negative for mouth sores, nosebleeds and sore throat.   Respiratory: Negative for cough and wheezing.   Cardiovascular: Negative for palpitations.  Gastrointestinal: Negative for abdominal pain, nausea and vomiting.       Negative for changes in bowel habits.  Genitourinary: Negative for decreased urine volume, dysuria and hematuria.   Musculoskeletal: Positive for back pain. Negative for gait problem.  Neurological: Negative for syncope and facial asymmetry.  Psychiatric/Behavioral: Positive for sleep disturbance. Negative for confusion. The patient is nervous/anxious.   Rest see pertinent positives and negatives per HPI.  Current Outpatient Medications on File Prior to Visit  Medication Sig Dispense Refill  . acetaminophen (TYLENOL) 650 MG CR tablet Take 650 mg by mouth every 8 (eight) hours as needed for pain.     Marland Kitchen albuterol (PROVENTIL) (2.5 MG/3ML) 0.083% nebulizer solution Take 3 mLs (2.5 mg total) by nebulization every 6 (six) hours as needed for wheezing or shortness of breath. 75 mL 12  . B-D INS SYR ULTRAFINE 1CC/30G 30G X 1/2" 1 ML MISC     . diclofenac sodium (VOLTAREN) 1 % GEL Apply 3 g to 3 large joints up to 3 times daily. (Patient taking differently: Apply 2 g topically daily as needed (pain). ) 3 Tube 3  . Estradiol (YUVAFEM) 10 MCG TABS vaginal tablet Yuvafem 10 mcg vaginal tablet    . famotidine (PEPCID) 20 MG tablet TAKE 1 TO 2 TABLETS BY MOUTH AT BEDTIME 60 tablet 1  . fluticasone furoate-vilanterol (BREO ELLIPTA) 100-25 MCG/INH AEPB Inhale 1 puff into the lungs daily. 28 each 0  . folic acid (FOLVITE) 1 MG tablet TAKE 2 TABLETS (2 MG TOTAL) BY MOUTH DAILY. (Patient taking differently: Take 1 mg by mouth 2 (two) times daily. ) 180 tablet 3  . hydrochlorothiazide (HYDRODIURIL) 25 MG tablet Take 1 tablet (25 mg total) by mouth daily. 30 tablet 3  .  losartan (COZAAR) 25 MG tablet Take 1 tablet (25 mg total) by mouth daily. 90 tablet 1  . methotrexate 50 MG/2ML injection INJECT 0.6 MLS (15 MG TOTAL) INTO THE SKIN ONCE A WEEK. 8 mL 0  . moxifloxacin (VIGAMOX) 0.5 % ophthalmic solution Apply 1 drop to eye 3 (three) times daily.    . Multiple Vitamins-Minerals (ZINC PO) Take by mouth daily.    . Omega-3 Fatty Acids (FISH OIL) 1000 MG CAPS Take by mouth.    . ondansetron (ZOFRAN) 4 MG tablet Take 1 tablet (4 mg  total) by mouth every 8 (eight) hours as needed for nausea or vomiting. 20 tablet 0  . pantoprazole (PROTONIX) 40 MG tablet TAKE 1 TABLET BY MOUTH TWICE A DAY 60 tablet 0  . predniSONE (DELTASONE) 5 MG tablet Take 2.5 mg by mouth daily.     Marland Kitchen senna-docusate (SENOKOT-S) 8.6-50 MG tablet Take 1 tablet by mouth 2 (two) times daily.    . Tuberculin-Allergy Syringes (ALLERGY SYRINGE 1CC/27GX1/2") 27G X 1/2" 1 ML MISC Patient to use to inject MTX weekly 12 each 3  . valACYclovir (VALTREX) 500 MG tablet TAKE 1 TABLET (500 MG TOTAL) BY MOUTH 2 (TWO) TIMES DAILY. FOR 3 DAYS WITH ACUTE EPISODES. 18 tablet 1  . vitamin B-12 (CYANOCOBALAMIN) 100 MCG tablet Take 100 mcg by mouth daily.    Marland Kitchen VITAMIN D, ERGOCALCIFEROL, PO Take 1 capsule by mouth daily.    [name] 11 MG TB24 Take 1 tablet by mouth daily.     No current facility-administered medications on file prior to visit.   Past Medical History:  Diagnosis Date  . Allergy   . Anemia   . Asthma   . Blood transfusion without reported diagnosis   . Fibromyalgia   . GERD (gastroesophageal reflux disease)   . Heart murmur   . High cholesterol   . History of stomach ulcers   . Hypertension   . Lupus ([place])   . MI, old   . Rheumatoid arthritis ([place])   . Thyroid disease   . Thyroid mass   . Vertigo     Allergies  Allergen Reactions  . Aspirin Other (See Comments)    GI bleed  . Demerol [Meperidine]   . Hydrocodone-Acetaminophen Hives and Nausea And Vomiting  . Ibuprofen Other (See Comments)    GI bleed  . Imdur [Isosorbide Nitrate] Other (See Comments)    Reaction unknown  . Meperidine Hcl Hives and Nausea And Vomiting    Short term memory loss  . Morphine Hives and Nausea And Vomiting  . Oxycodone-Acetaminophen Hives and Nausea And Vomiting    Reaction unknown  . Procaine Hcl Other (See Comments)    Ineffective  . Toprol Xl [Metoprolol] Other (See Comments)    Reaction unknown  . Tramadol Itching    Social History    Socioeconomic History  . Marital status: Married    Spouse name: Not on file  . Number of children: 4  . Years of education: Not on file  . Highest education level: Not on file  Occupational History  . Occupation: Financial planner: [employer]  Tobacco Use  . Smoking status: Never Smoker  . Smokeless tobacco: Never Used  Substance and Sexual Activity  . Alcohol use: Yes    Alcohol/week: 1.0 standard drinks    Types: 1 Glasses of wine per week    Comment: occ  . Drug use: No  . Sexual activity: Yes  Birth control/protection: None, Surgical    Comment: LAVH  Other Topics Concern  . 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     Social Determinants of Health   Financial Resource Strain:   . Difficulty of Paying Living Expenses:   Food Insecurity:   . Worried About Programme researcher, broadcasting/film/video in the Last Year:   . Barista in the Last Year:   Transportation Needs:   . Freight forwarder (Medical):   Marland Kitchen Lack of Transportation (Non-Medical):   Physical Activity:   . Days of Exercise per Week:   . Minutes of Exercise per Session:   Stress:   . Feeling of Stress :   Social Connections:   . Frequency of Communication with Friends and Family:   . Frequency of Social Gatherings with Friends and Family:   . Attends Religious Services:   . Active Member of Clubs or Organizations:   . Attends Banker Meetings:   Marland Kitchen Marital Status:    Vitals:   05/31/19 0717  BP: 120/82  Pulse: 83  Resp: 12  Temp: (!) 97.2 F (36.2 C)  SpO2: 99%   Body mass index is 35.47 kg/m. Physical Exam  Nursing note and vitals reviewed. Constitutional: She is oriented to person, place, and time. She appears well-developed. No distress.  HENT:  Head: Normocephalic and atraumatic.  Mouth/Throat: Oropharynx is clear and moist and mucous membranes are normal.  Eyes: Pupils are  equal, round, and reactive to light. Conjunctivae are normal.  Cardiovascular: Normal rate and regular rhythm.  No murmur heard. Pulses:      Dorsalis pedis pulses are 2+ on the right side and 2+ on the left side.  Respiratory: Effort normal and breath sounds normal. No respiratory distress. She exhibits no tenderness.  GI: Soft. She exhibits no mass. There is no hepatomegaly. There is no abdominal tenderness.  Musculoskeletal:        General: No edema.     Comments: +Tender trigger points in upper back, upper and lower extremities. No signs of synovitis.  Lymphadenopathy:    She has no cervical adenopathy.  Neurological: She is alert and oriented to person, place, and time. She has normal strength. No cranial nerve deficit. Gait normal.  Skin: Skin is warm. No rash noted. No erythema.  Psychiatric: Her mood appears anxious.  Well groomed, good eye contact.   ASSESSMENT AND PLAN:   PatriciaMarquette was seen today for follow-up.  Diagnoses and all orders for this visit:  Hypokalemia Mild. Continue potassium rich diet. We discussed some side effects of HCTZ. We will plan on checking K+ next visit.  Chest pain, unspecified type Resolved. We discussed possible etiologies. The probability of this being cardiac related is low, it seems more musculoskeletal and could be related to fibromyalgia. Instructed about warning signs.  Fibromyalgia We discussed diagnosis, prognosis, and treatment options. After discussion of some side effects she agrees with trying Cymbalta, will start with low dose, 30 mg daily. Low impact exercises, water exercises are ideal and she is planning on starting in a few weeks. Good sleep hygiene. Can continue Baclofen at bedtime for myalgias.  HTN (hypertension) BP adequately controled. In the ER BP was mildly low, so instructed to monitor BP at home. Continue low salt diet. No changes in current management.  Return in about 5 weeks (around 07/05/2019) for  Fibromyalgia.   Jamaris Theard G. [place],  MD  [facility]. [place] office.  A few things to remember from today's visit:   Cymbalta 30 mg daily.This may help with joint pain. Will re-check potassium next visit. Monitor blood pressure at home. Continue a healthful diet and start water exercises.  If you need refills please call your pharmacy. Do not use My Chart to request refills or for acute issues that need immediate attention.    Please be sure medication list is accurate. If a new problem present, please set up appointment sooner than planned today.

## 2019-05-31 NOTE — Assessment & Plan Note (Signed)
BP adequately controled. In the ER BP was mildly low, so instructed to monitor BP at home. Continue low salt diet. No changes in current management.

## 2019-05-31 NOTE — Assessment & Plan Note (Addendum)
We discussed diagnosis, prognosis, and treatment options. After discussion of some side effects she agrees with trying Cymbalta, will start with low dose, 30 mg daily. Low impact exercises, water exercises are ideal and she is planning on starting in a few weeks. Good sleep hygiene. Can continue Baclofen at bedtime for myalgias.

## 2019-05-31 NOTE — Patient Instructions (Addendum)
A few things to remember from today's visit:   Cymbalta 30 mg daily.This may help with joint pain. Will re-check potassium next visit. Monitor blood pressure at home. Continue a healthful diet and start water exercises.  If you need refills please call your pharmacy. Do not use My Chart to request refills or for acute issues that need immediate attention.    Please be sure medication list is accurate. If a new problem present, please set up appointment sooner than planned today.

## 2019-06-01 NOTE — Telephone Encounter (Signed)
Called [employer] and spoke with [place]. order number: [id number] They were calling to see if we received a request of information for this patient so that they can get office notes. I gave her my fax number so that I could follow up on this request. Will wait to receive request.

## 2019-06-02 NOTE — Telephone Encounter (Signed)
Record request has been received. I have faxed paperwork to [phone]. Confirmation received. Will send record request to scan for patient's chart. Nothing further needed at this time.

## 2019-06-17 ENCOUNTER — Other Ambulatory Visit (HOSPITAL_COMMUNITY)
Admission: RE | Admit: 2019-06-17 | Discharge: 2019-06-17 | Disposition: A | Discharge status: Home / Self Care | Payer: BC Managed Care – PPO | Source: Ambulatory Visit | Attending: Primary Care | Admitting: Primary Care

## 2019-06-17 DIAGNOSIS — Z01812 Encounter for preprocedural laboratory examination: Secondary | ICD-10-CM | POA: Diagnosis present

## 2019-06-17 DIAGNOSIS — Z20822 Contact with and (suspected) exposure to covid-19: Secondary | ICD-10-CM | POA: Insufficient documentation

## 2019-06-17 LAB — SARS CORONAVIRUS 2 (TAT 6-24 HRS): SARS Coronavirus 2: NEGATIVE

## 2019-06-19 ENCOUNTER — Other Ambulatory Visit: Payer: Self-pay | Admitting: Family Medicine

## 2019-06-19 ENCOUNTER — Other Ambulatory Visit: Payer: Self-pay | Admitting: Nurse Practitioner

## 2019-06-19 DIAGNOSIS — K2940 Chronic atrophic gastritis without bleeding: Secondary | ICD-10-CM

## 2019-06-19 DIAGNOSIS — R1013 Epigastric pain: Secondary | ICD-10-CM

## 2019-06-19 DIAGNOSIS — K219 Gastro-esophageal reflux disease without esophagitis: Secondary | ICD-10-CM

## 2019-06-21 ENCOUNTER — Ambulatory Visit (INDEPENDENT_AMBULATORY_CARE_PROVIDER_SITE_OTHER): Payer: BC Managed Care – PPO | Admitting: Emergency Medicine

## 2019-06-21 ENCOUNTER — Other Ambulatory Visit: Payer: Self-pay | Admitting: Emergency Medicine

## 2019-06-21 ENCOUNTER — Other Ambulatory Visit: Payer: Self-pay

## 2019-06-21 DIAGNOSIS — J4520 Mild intermittent asthma, uncomplicated: Secondary | ICD-10-CM

## 2019-06-21 LAB — PULMONARY FUNCTION TEST
DL/VA % pred: 109 %
DL/VA: 4.61 ml/min/mmHg/L
DLCO cor % pred: 86 %
DLCO cor: 18.34 ml/min/mmHg
DLCO unc % pred: 82 %
DLCO unc: 17.56 ml/min/mmHg
FEF 25-75 Post: 3.57 L/sec
FEF 25-75 Pre: 3.42 L/sec
FEF2575-%Change-Post: 4 %
FEF2575-%Pred-Post: 158 %
FEF2575-%Pred-Pre: 151 %
FEV1-%Change-Post: 0 %
FEV1-%Pred-Post: 108 %
FEV1-%Pred-Pre: 107 %
FEV1-Post: 2.44 L
FEV1-Pre: 2.42 L
FEV1FVC-%Change-Post: 4 %
FEV1FVC-%Pred-Pre: 107 %
FEV6-%Change-Post: -2 %
FEV6-%Pred-Post: 98 %
FEV6-%Pred-Pre: 101 %
FEV6-Post: 2.72 L
FEV6-Pre: 2.79 L
FEV6FVC-%Change-Post: 0 %
FEV6FVC-%Pred-Post: 103 %
FEV6FVC-%Pred-Pre: 103 %
FVC-%Change-Post: -2 %
FVC-%Pred-Post: 95 %
FVC-%Pred-Pre: 98 %
FVC-Post: 2.72 L
FVC-Pre: 2.8 L
Post FEV1/FVC ratio: 90 %
Post FEV6/FVC ratio: 100 %
Pre FEV1/FVC ratio: 86 %
Pre FEV6/FVC Ratio: 100 %
RV % pred: 43 %
RV: 0.86 L
TLC % pred: 69 %
TLC: 3.64 L

## 2019-06-21 NOTE — Progress Notes (Signed)
PFT done today. 

## 2019-06-22 ENCOUNTER — Other Ambulatory Visit: Payer: Self-pay | Admitting: Family Medicine

## 2019-06-22 DIAGNOSIS — I10 Essential (primary) hypertension: Secondary | ICD-10-CM

## 2019-06-23 ENCOUNTER — Other Ambulatory Visit: Payer: Self-pay | Admitting: *Deleted

## 2019-06-23 DIAGNOSIS — Z79899 Other long term (current) drug therapy: Secondary | ICD-10-CM

## 2019-06-23 DIAGNOSIS — M351 Other overlap syndromes: Secondary | ICD-10-CM

## 2019-06-25 NOTE — Progress Notes (Signed)
CBC is normal, CMP shows low potassium.  Patient is on HCTZ.  She will need additional potassium.  She should contact her PCP.  Please forward labs to her PCP and contact the office.

## 2019-06-26 LAB — URINALYSIS, ROUTINE W REFLEX MICROSCOPIC
Bilirubin Urine: NEGATIVE
Glucose, UA: NEGATIVE
Hgb urine dipstick: NEGATIVE
Ketones, ur: NEGATIVE
Leukocytes,Ua: NEGATIVE
Nitrite: NEGATIVE
Protein, ur: NEGATIVE
Specific Gravity, Urine: 1.023 (ref 1.001–1.03)
pH: 6 (ref 5.0–8.0)

## 2019-06-26 LAB — COMPLETE METABOLIC PANEL WITH GFR
AG Ratio: 1.5 (calc) (ref 1.0–2.5)
ALT: 13 U/L (ref 6–29)
AST: 19 U/L (ref 10–35)
Albumin: 4.4 g/dL (ref 3.6–5.1)
Alkaline phosphatase (APISO): 59 U/L (ref 37–153)
BUN: 16 mg/dL (ref 7–25)
CO2: 26 mmol/L (ref 20–32)
Calcium: 9 mg/dL (ref 8.6–10.4)
Chloride: 100 mmol/L (ref 98–110)
Creat: 0.68 mg/dL (ref 0.50–1.05)
GFR, Est African American: 113 mL/min/{1.73_m2} (ref >=60)
GFR, Est Non African American: 97 mL/min/{1.73_m2} (ref >=60)
Globulin: 3 g/dL (calc) (ref 1.9–3.7)
Glucose, Bld: 110 mg/dL — ABNORMAL HIGH (ref 65–99)
Potassium: 3 mmol/L — ABNORMAL LOW (ref 3.5–5.3)
Sodium: 137 mmol/L (ref 135–146)
Total Bilirubin: 0.5 mg/dL (ref 0.2–1.2)
Total Protein: 7.4 g/dL (ref 6.1–8.1)

## 2019-06-26 LAB — CBC WITH DIFFERENTIAL/PLATELET
Absolute Monocytes: 211 cells/uL (ref 200–950)
Basophils Absolute: 22 cells/uL (ref 0–200)
Basophils Relative: 0.5 %
Eosinophils Absolute: 30 cells/uL (ref 15–500)
Eosinophils Relative: 0.7 %
HCT: 37.3 % (ref 35.0–45.0)
Hemoglobin: 12.1 g/dL (ref 11.7–15.5)
Lymphs Abs: 860 cells/uL (ref 850–3900)
MCH: 28.2 pg (ref 27.0–33.0)
MCHC: 32.4 g/dL (ref 32.0–36.0)
MCV: 86.9 fL (ref 80.0–100.0)
MPV: 10.7 fL (ref 7.5–12.5)
Monocytes Relative: 4.9 %
Neutro Abs: 3178 cells/uL (ref 1500–7800)
Neutrophils Relative %: 73.9 %
Platelets: 324 10*3/uL (ref 140–400)
RBC: 4.29 10*6/uL (ref 3.80–5.10)
RDW: 14.3 % (ref 11.0–15.0)
Total Lymphocyte: 20 %
WBC: 4.3 10*3/uL (ref 3.8–10.8)

## 2019-06-26 LAB — ANTI-DNA ANTIBODY, DOUBLE-STRANDED: ds DNA Ab: 1 IU/mL

## 2019-06-26 LAB — RNP ANTIBODY: Ribonucleic Protein(ENA) Antibody, IgG: >8 AI — AB

## 2019-06-26 LAB — SEDIMENTATION RATE: Sed Rate: 22 mm/h (ref 0–30)

## 2019-06-26 LAB — ANTI-SMITH ANTIBODY: ENA SM Ab Ser-aCnc: 2.7 AI — AB

## 2019-06-26 LAB — C3 AND C4
C3 Complement: 164 mg/dL (ref 83–193)
C4 Complement: 34 mg/dL (ref 15–57)

## 2019-06-26 LAB — ANA: Anti Nuclear Antibody (ANA): POSITIVE — AB

## 2019-06-26 LAB — ANTI-NUCLEAR AB-TITER (ANA TITER)
ANA TITER: 1:320 {titer} — ABNORMAL HIGH
ANA Titer 1: >=1:1280 {titer} — AB

## 2019-06-26 NOTE — Progress Notes (Signed)
Autoimmune labs are stable

## 2019-07-03 ENCOUNTER — Telehealth: Payer: Self-pay | Admitting: Rheumatology

## 2019-07-03 MED ORDER — PREDNISONE 5 MG PO TABS
ORAL_TABLET | ORAL | 0 refills | Status: DC
Start: 2019-07-03 — End: 2019-08-29

## 2019-07-03 NOTE — Telephone Encounter (Signed)
Patient advised prescription was sent to the pharmacy and patient advised.

## 2019-07-03 NOTE — Telephone Encounter (Signed)
Patient calling because she thinks she maybe in a flare. Patient having a lot of pain all over, swelling in all joints. Pain started Thursday of last week. Patient uses [facility] on [address]. Please call to advise.

## 2019-07-03 NOTE — Telephone Encounter (Signed)
Patient states she is having swelling in her joints. Patient states "everything is swollen". Patient states her right hand is swollen more than her left hand. Patient states she has been having pain and swelling since Wednesday of last week. Patient states she has been trying Tylenol arthritis and has not had much relief. Patient states it is a constant ache. Patient states she taking Xeljanz 11 mg daily and MTX 0.6 mL weekly and denies missing any doses. Patient is requesting a prescription for prednisone. Please advise.

## 2019-07-03 NOTE — Telephone Encounter (Signed)
Okay to send a prednisone taper starting at 20 mg and taper by 5 mg every 4 days.

## 2019-07-07 ENCOUNTER — Other Ambulatory Visit: Payer: Self-pay

## 2019-07-07 ENCOUNTER — Encounter: Payer: Self-pay | Admitting: Family Medicine

## 2019-07-07 ENCOUNTER — Ambulatory Visit (INDEPENDENT_AMBULATORY_CARE_PROVIDER_SITE_OTHER): Payer: BC Managed Care – PPO | Admitting: Family Medicine

## 2019-07-07 VITALS — BP 120/76 | HR 84 | Temp 97.7°F | Resp 12 | Ht 65.0 in | Wt 209.2 lb

## 2019-07-07 DIAGNOSIS — I10 Essential (primary) hypertension: Secondary | ICD-10-CM | POA: Diagnosis not present

## 2019-07-07 DIAGNOSIS — E876 Hypokalemia: Secondary | ICD-10-CM | POA: Diagnosis not present

## 2019-07-07 DIAGNOSIS — M797 Fibromyalgia: Secondary | ICD-10-CM

## 2019-07-07 MED ORDER — DULOXETINE HCL 30 MG PO CPEP: 30.0000 mg | ORAL_CAPSULE | Freq: Every day | ORAL | 2 refills | Status: DC

## 2019-07-07 MED ORDER — POTASSIUM CHLORIDE CRYS ER 20 MEQ PO TBCR
20.0000 meq | EXTENDED_RELEASE_TABLET | Freq: Every day | ORAL | 1 refills | Status: DC
Start: 2019-07-07 — End: 2020-01-01

## 2019-07-07 NOTE — Assessment & Plan Note (Signed)
We discussed some side effects of HCTZ, she would like to continue medication. KCL 20 mEq daily started today. K+ check in 3 to 4 weeks.

## 2019-07-07 NOTE — Patient Instructions (Addendum)
A few things to remember from today's visit:   Fibromyalgia  Essential hypertension  Potassium started today. Lab in 3-4 weeks. No changes in Cymbalta or blood pressure meds.  If you need refills please call your pharmacy. Do not use My Chart to request refills or for acute issues that need immediate attention.    Please be sure medication list is accurate. If a new problem present, please set up appointment sooner than planned today.

## 2019-07-07 NOTE — Assessment & Plan Note (Signed)
Great improvement, so continue Cymbalta 30 mg daily. Low impact exercise and good sleep hygiene. Follow-up in 6 months.

## 2019-07-07 NOTE — Progress Notes (Signed)
HPI: Patricia Reed is a 59 y.o. female, who is here today for follow up.   She was last seen on 05/31/19.  Fibromyalgia: She is taking duloxetine 30 mg daily, added last visit. She has tolerated well. She has noted great improvement in myalgias and she is able to sleep better because of no pain for the night. She reporting "at least 50% improvement."  Planning on retiring 07/20/2019. Anxiety has improved.  HTN and hypoK+: She is on HCTZ 25 mg daily and Losartan 25 mg daily.  Lab Results  Component Value Date   CREATININE 0.68 06/23/2019   BUN 16 06/23/2019   NA 137 06/23/2019   K 3.0 (L) 06/23/2019   CL 100 06/23/2019   CO2 26 06/23/2019   Negative for severe/frequent headache, visual changes, chest pain, dyspnea, palpitation, claudication, focal weakness, or edema.  Review of Systems  Constitutional: Positive for fatigue. Negative for activity change, appetite change and fever.  HENT: Negative for mouth sores, nosebleeds and sore throat.   Respiratory: Negative for cough and wheezing.   Gastrointestinal: Negative for abdominal pain, nausea and vomiting.       Negative for changes in bowel habits.  Genitourinary: Negative for decreased urine volume and hematuria.  Neurological: Negative for syncope, facial asymmetry and weakness.  Psychiatric/Behavioral: Negative for behavioral problems and confusion.  Rest of ROS, see pertinent positives sand negatives in HPI  Current Outpatient Medications on File Prior to Visit  Medication Sig Dispense Refill  . acetaminophen (TYLENOL) 650 MG CR tablet Take 650 mg by mouth every 8 (eight) hours as needed for pain.     Marland Kitchen albuterol (PROVENTIL) (2.5 MG/3ML) 0.083% nebulizer solution Take 3 mLs (2.5 mg total) by nebulization every 6 (six) hours as needed for wheezing or shortness of breath. 75 mL 12  . B-D INS SYR ULTRAFINE 1CC/30G 30G X 1/2" 1 ML MISC     . baclofen (LIORESAL) 10 MG tablet TAKE 1 TABLET BY MOUTH AT  BEDTIME AS NEEDED FOR MUSCLE SPASMS 30 tablet 3  . diclofenac sodium (VOLTAREN) 1 % GEL Apply 3 g to 3 large joints up to 3 times daily. (Patient taking differently: Apply 2 g topically daily as needed (pain). ) 3 Tube 3  . Estradiol (YUVAFEM) 10 MCG TABS vaginal tablet Yuvafem 10 mcg vaginal tablet    . famotidine (PEPCID) 20 MG tablet TAKE 1 TO 2 TABLETS BY MOUTH AT BEDTIME 60 tablet 1  . fluticasone furoate-vilanterol (BREO ELLIPTA) 100-25 MCG/INH AEPB Inhale 1 puff into the lungs daily. 28 each 0  . folic acid (FOLVITE) 1 MG tablet TAKE 2 TABLETS (2 MG TOTAL) BY MOUTH DAILY. (Patient taking differently: Take 1 mg by mouth 2 (two) times daily. ) 180 tablet 3  . hydrochlorothiazide (HYDRODIURIL) 25 MG tablet TAKE 1 TABLET BY MOUTH EVERY DAY 90 tablet 1  . methotrexate 50 MG/2ML injection INJECT 0.6 MLS (15 MG TOTAL) INTO THE SKIN ONCE A WEEK. 8 mL 0  . moxifloxacin (VIGAMOX) 0.5 % ophthalmic solution Apply 1 drop to eye 3 (three) times daily.    . Multiple Vitamins-Minerals (ZINC PO) Take by mouth daily.    . Omega-3 Fatty Acids (FISH OIL) 1000 MG CAPS Take by mouth.    . ondansetron (ZOFRAN) 4 MG tablet Take 1 tablet (4 mg total) by mouth every 8 (eight) hours as needed for nausea or vomiting. 20 tablet 0  . pantoprazole (PROTONIX) 40 MG tablet TAKE 1  TABLET BY MOUTH TWICE A DAY 60 tablet 0  . predniSONE (DELTASONE) 5 MG tablet Take 2.5 mg by mouth daily.     . predniSONE (DELTASONE) 5 MG tablet Take 4 tabs po x 4 days, 3  tabs po x 4 days, 2  tabs po x 4 days, 1  tab po x 4 days 40 tablet 0  . senna-docusate (SENOKOT-S) 8.6-50 MG tablet Take 1 tablet by mouth 2 (two) times daily.    . Tuberculin-Allergy Syringes (ALLERGY SYRINGE 1CC/27GX1/2") 27G X 1/2" 1 ML MISC Patient to use to inject MTX weekly 12 each 3  . valACYclovir (VALTREX) 500 MG tablet TAKE 1 TABLET (500 MG TOTAL) BY MOUTH 2 (TWO) TIMES DAILY. FOR 3 DAYS WITH ACUTE EPISODES. 18 tablet 1  . vitamin B-12 (CYANOCOBALAMIN) 100 MCG  tablet Take 100 mcg by mouth daily.    Marland Kitchen VITAMIN D, ERGOCALCIFEROL, PO Take 1 capsule by mouth daily.    [name] XR 11 MG TB24 Take 1 tablet by mouth daily.     No current facility-administered medications on file prior to visit.     Past Medical History:  Diagnosis Date  . Allergy   . Anemia   . Asthma   . Blood transfusion without reported diagnosis   . Fibromyalgia   . GERD (gastroesophageal reflux disease)   . Heart murmur   . High cholesterol   . History of stomach ulcers   . Hypertension   . Lupus ([facility])   . MI, old   . Rheumatoid arthritis ([facility])   . Thyroid disease   . Thyroid mass   . Vertigo    Allergies  Allergen Reactions  . Aspirin Other (See Comments)    GI bleed  . Demerol [Meperidine]   . Hydrocodone-Acetaminophen Hives and Nausea And Vomiting  . Ibuprofen Other (See Comments)    GI bleed  . Imdur [Isosorbide Nitrate] Other (See Comments)    Reaction unknown  . Meperidine Hcl Hives and Nausea And Vomiting    Short term memory loss  . Morphine Hives and Nausea And Vomiting  . Oxycodone-Acetaminophen Hives and Nausea And Vomiting    Reaction unknown  . Procaine Hcl Other (See Comments)    Ineffective  . Toprol Xl [Metoprolol] Other (See Comments)    Reaction unknown  . Tramadol Itching    Social History   Socioeconomic History  . Marital status: Married    Spouse name: Not on file  . Number of children: 4  . Years of education: Not on file  . Highest education level: Not on file  Occupational History  . Occupation: Lobbyist: [employer]  Tobacco Use  . Smoking status: Never Smoker  . Smokeless tobacco: Never Used  Vaping Use  . Vaping Use: Never used  Substance and Sexual Activity  . Alcohol use: Yes    Alcohol/week: 1.0 standard drink    Types: 1 Glasses of wine per week    Comment: occ  . Drug use: No  . Sexual activity: Yes    Birth control/protection: None, Surgical    Comment: LAVH  Other  Topics Concern  . 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     Social Determinants of Health   Financial Resource Strain:   . Difficulty of Paying Living Expenses:   Food Insecurity:   . Worried About Programme researcher, broadcasting/film/video in  the Last Year:   . [place] in the Last Year:   Transportation Needs:   . Film/video editor (Medical):   Marland Kitchen Lack of Transportation (Non-Medical):   Physical Activity:   . Days of Exercise per Week:   . Minutes of Exercise per Session:   Stress:   . Feeling of Stress :   Social Connections:   . Frequency of Communication with Friends and Family:   . Frequency of Social Gatherings with Friends and Family:   . Attends Religious Services:   . Active Member of Clubs or Organizations:   . Attends Archivist Meetings:   Marland Kitchen Marital Status:     Vitals:   07/07/19 0946  BP: 120/76  Pulse: 84  Resp: 12  Temp: 97.7 F (36.5 C)  SpO2: 96%   Wt Readings from Last 3 Encounters:  07/07/19 209 lb 4 oz (94.9 kg)  05/31/19 213 lb 2 oz (96.7 kg)  05/27/19 211 lb (95.7 kg)    Body mass index is 34.82 kg/m.  Physical Exam  Nursing note and vitals reviewed. Constitutional: She is oriented to person, place, and time. She appears well-developed. No distress.  HENT:  Head: Normocephalic and atraumatic.  Mouth/Throat: Mucous membranes are moist.  Eyes: Pupils are equal, round, and reactive to light. Conjunctivae are normal.  Cardiovascular: Normal rate and regular rhythm.  No murmur heard. Pulses:      Dorsalis pedis pulses are 2+ on the right side and 2+ on the left side.  Respiratory: Effort normal and breath sounds normal. No respiratory distress.  GI: Soft. Normal appearance. She exhibits no mass. There is no hepatomegaly. There is no abdominal tenderness.  Musculoskeletal:       Back:     Comments: Trigger point x 1.  Lymphadenopathy:    She has no cervical  adenopathy.  Neurological: She is alert and oriented to person, place, and time. No cranial nerve deficit. Gait normal.  Skin: Skin is warm. No rash noted. No erythema.  Psychiatric:  Well groomed, good eye contact.    ASSESSMENT AND PLAN:  Patricia Reed was seen today for follow-up.  Orders Placed This Encounter  Procedures  . Potassium    Fibromyalgia Great improvement, so continue Cymbalta 30 mg daily. Low impact exercise and good sleep hygiene. Follow-up in 6 months.  Hypokalemia We discussed some side effects of HCTZ, she would like to continue medication. KCL 20 mEq daily started today. K+ check in 3 to 4 weeks.  Essential hypertension BP adequately controlled. No changes in current management. Continue low salt diet.   Return in about 6 months (around 01/06/2020).   Hiroki Wint G. [name], MD  [facility]. [place] office.  A few things to remember from today's visit:   Fibromyalgia  Essential hypertension  Potassium started today. Lab in 3-4 weeks. No changes in Cymbalta or blood pressure meds.  If you need refills please call your pharmacy. Do not use My Chart to request refills or for acute issues that need immediate attention.    Please be sure medication list is accurate. If a new problem present, please set up appointment sooner than planned today.

## 2019-07-08 MED ORDER — LOSARTAN POTASSIUM 25 MG PO TABS: 25.0000 mg | ORAL_TABLET | Freq: Every day | ORAL | 1 refills | Status: DC

## 2019-07-13 ENCOUNTER — Telehealth: Payer: Self-pay | Admitting: *Deleted

## 2019-07-13 NOTE — Telephone Encounter (Signed)
Patient called after hours line. Patient reports she stopped taking her Cymbalta cause it made her so sick. Vomiting and dizzy. Wanted Dr. [place] to know she is not taking Cymbalta anymore.

## 2019-07-14 NOTE — Telephone Encounter (Signed)
Message sent to PCP for review

## 2019-07-14 NOTE — Telephone Encounter (Signed)
Noted  

## 2019-07-17 ENCOUNTER — Other Ambulatory Visit: Payer: Self-pay | Admitting: Family Medicine

## 2019-07-17 ENCOUNTER — Other Ambulatory Visit: Payer: Self-pay | Admitting: Nurse Practitioner

## 2019-07-17 DIAGNOSIS — K2940 Chronic atrophic gastritis without bleeding: Secondary | ICD-10-CM

## 2019-07-17 DIAGNOSIS — K219 Gastro-esophageal reflux disease without esophagitis: Secondary | ICD-10-CM

## 2019-07-17 DIAGNOSIS — R1013 Epigastric pain: Secondary | ICD-10-CM

## 2019-07-18 NOTE — Telephone Encounter (Signed)
Pt needs office visit for further refills 

## 2019-07-28 ENCOUNTER — Other Ambulatory Visit: Payer: Self-pay

## 2019-07-28 ENCOUNTER — Other Ambulatory Visit (INDEPENDENT_AMBULATORY_CARE_PROVIDER_SITE_OTHER): Payer: BC Managed Care – PPO

## 2019-07-28 DIAGNOSIS — E876 Hypokalemia: Secondary | ICD-10-CM | POA: Diagnosis not present

## 2019-07-28 LAB — POTASSIUM: Potassium: 3.7 mEq/L (ref 3.5–5.1)

## 2019-08-08 ENCOUNTER — Encounter: Payer: Self-pay | Admitting: Primary Care

## 2019-08-14 ENCOUNTER — Telehealth: Payer: Self-pay | Admitting: Rheumatology

## 2019-08-14 NOTE — Telephone Encounter (Addendum)
Last Visit: 04/26/2019 Next Visit: 08/21/2019 Labs: 06/23/2019 CBC is normal, CMP shows low potassium  Current Dose per office note on 04/26/2019: Xeljanz XR 11 mg 1 tablet by mouth daily  Patient was receiving this prescription from [facility] Verdon [facility]ins [facility] [place]. Patient states he is no longer there and would like for you to take over prescription.  Per Dr. Corliss Skains patient should be seen at the [facility] and have then continue prescribing medication.  Patient advised

## 2019-08-14 NOTE — Telephone Encounter (Signed)
Patient request rx on Xeljanx to be sent to [facility].

## 2019-08-19 ENCOUNTER — Other Ambulatory Visit: Payer: Self-pay | Admitting: Nurse Practitioner

## 2019-08-19 DIAGNOSIS — R1013 Epigastric pain: Secondary | ICD-10-CM

## 2019-08-19 DIAGNOSIS — K2940 Chronic atrophic gastritis without bleeding: Secondary | ICD-10-CM

## 2019-08-21 ENCOUNTER — Ambulatory Visit: Payer: BC Managed Care – PPO | Admitting: Primary Care

## 2019-08-21 ENCOUNTER — Other Ambulatory Visit: Payer: Self-pay | Admitting: Rheumatology

## 2019-08-21 ENCOUNTER — Telehealth: Payer: Self-pay | Admitting: Emergency Medicine

## 2019-08-21 NOTE — Telephone Encounter (Signed)
Patient left a voicemail requesting prescription refill of Methotrexate.  Patient states she is completely out and requesting the prescription be sent ASAP.

## 2019-08-21 NOTE — Telephone Encounter (Signed)
Called and spoke with Patient.  Patient stated she received a letter from disibilty stating her disiblity was denied, because she was not sick enough to be removed from work. Patient stated she was out of work from 02/27/19 to 03/26/19.  Patient stated she needs OV notes and records faxed to [place], fax # [phone], with claim # [id number].  Message routed to [facility]

## 2019-08-21 NOTE — Telephone Encounter (Signed)
Please schedule patient for a follow up visit. Patient due September 2021. Thanks!   

## 2019-08-21 NOTE — Telephone Encounter (Signed)
Last Visit: 04/26/2019 Next Visit: due September 2021  Labs: 06/23/2019 CBC is normal, CMP shows low potassium  Current Dose per office note 04/26/2019: methotrexate 0.6 ml every 7 days DX:  Mixed connective tissue disease   Okay to refill MTX?

## 2019-08-22 MED ORDER — METHOTREXATE SODIUM CHEMO INJECTION 50 MG/2ML: 15.0000 mg | INTRAMUSCULAR | 0 refills | Status: DC

## 2019-08-25 NOTE — Telephone Encounter (Signed)
Faxed ov notes to fax number provided. Called and informed patient - nothing further needed-pr

## 2019-08-27 ENCOUNTER — Other Ambulatory Visit: Payer: Self-pay | Admitting: Family Medicine

## 2019-08-27 DIAGNOSIS — M797 Fibromyalgia: Secondary | ICD-10-CM

## 2019-08-29 ENCOUNTER — Encounter: Payer: Self-pay | Admitting: Adult Health

## 2019-08-29 ENCOUNTER — Ambulatory Visit (INDEPENDENT_AMBULATORY_CARE_PROVIDER_SITE_OTHER): Payer: BC Managed Care – PPO | Admitting: Adult Health

## 2019-08-29 ENCOUNTER — Other Ambulatory Visit: Payer: Self-pay

## 2019-08-29 DIAGNOSIS — J309 Allergic rhinitis, unspecified: Secondary | ICD-10-CM | POA: Diagnosis not present

## 2019-08-29 DIAGNOSIS — J4520 Mild intermittent asthma, uncomplicated: Secondary | ICD-10-CM

## 2019-08-29 NOTE — Assessment & Plan Note (Signed)
Mild persistent asthma - need to take on regular basis   Plan  Patient Instructions  Restart BREO 1 puff daily, rinse after use .  Activity as tolerated.  Albuterol inhaler As needed   Follow up with Dr. Delton Coombes in 6 months and As needed

## 2019-08-29 NOTE — Patient Instructions (Signed)
Restart BREO 1 puff daily, rinse after use .  Activity as tolerated.  Albuterol inhaler As needed   Follow up with Dr. Delton Coombes in 6 months and As needed

## 2019-08-29 NOTE — Progress Notes (Signed)
@Patient  ID: , female    DOB: [birth year], 59 y.o.   MRN: [id number]  Chief Complaint  Patient presents with  . 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   TEST/EVENTS :   08/29/2019 Follow up : Asthma /AR  Patient returns for a 26-month follow-up.  Patient has underlying mild persistent asthma. Says breathing has not been doing as well with high humidity. Not taking BREO on regular basis . Some mild wheezing and increased dyspnea . Uses albuterol 1 x week.  Starting to walk on treadmill.  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  PFT in June 2021 showed normal lung function, no airflow obstruction/restriction. Normal dlco. No BD response.  covid vaccine utd.    Allergies  Allergen Reactions  . Aspirin Other (See Comments)    GI bleed  . Demerol [Meperidine]   . Hydrocodone-Acetaminophen Hives and Nausea And Vomiting  . Ibuprofen Other (See Comments)    GI bleed  . Imdur [Isosorbide Nitrate] Other (See Comments)    Reaction unknown  . Meperidine Hcl Hives and Nausea And Vomiting    Short term memory loss  . Morphine Hives and Nausea And Vomiting  . Oxycodone-Acetaminophen Hives and Nausea And Vomiting    Reaction unknown  . Procaine Hcl Other (See Comments)    Ineffective  . Toprol Xl [Metoprolol] Other (See Comments)    Reaction unknown  . Tramadol Itching    Immunization History  Administered Date(s) Administered  . Influenza, Quadrivalent, Recombinant, Inj, Pf 10/11/2017  . Influenza,inj,Quad PF,6+ Mos 11/04/2015, 10/21/2018  . PFIZER SARS-COV-2 Vaccination 03/20/2019, 04/12/2019   . Zoster Recombinat (Shingrix) 10/11/2017, 12/25/2017    Past Medical History:  Diagnosis Date  . Allergy   . Anemia   . Asthma   . Blood transfusion without reported diagnosis   . Fibromyalgia   . GERD (gastroesophageal reflux disease)   . Heart murmur   . High cholesterol   . History of stomach ulcers   . Hypertension   . Lupus (HCC)   . MI, old   . Rheumatoid arthritis (HCC)   . Thyroid disease   . Thyroid mass   . Vertigo     Tobacco History: Social History   Tobacco Use  Smoking Status Never Smoker  Smokeless Tobacco Never Used   Counseling given: Not Answered   Outpatient Medications Prior to Visit  Medication Sig Dispense Refill  . acetaminophen (TYLENOL) 650 MG CR tablet Take 650 mg by mouth every 8 (eight) hours as needed for pain.     14/07/2017 albuterol (PROVENTIL) (2.5 MG/3ML) 0.083% nebulizer solution Take 3 mLs (2.5 mg total) by nebulization every 6 (six) hours as needed for wheezing or shortness of breath. 75 mL 12  . B-D INS SYR ULTRAFINE 1CC/30G 30G X 1/2" 1 ML MISC     . baclofen (LIORESAL) 10 MG tablet TAKE 1 TABLET BY MOUTH AT BEDTIME AS NEEDED FOR MUSCLE SPASMS 90 tablet 1  . diclofenac sodium (VOLTAREN) 1 % GEL Apply 3 g to 3 large joints up to 3 times daily. (Patient taking differently: Apply 2 g topically daily as needed (pain). ) 3 Tube 3  . Estradiol (  YUVAFEM) 10 MCG TABS vaginal tablet Yuvafem 10 mcg vaginal tablet    . famotidine (PEPCID) 20 MG tablet TAKE 1 TO 2 TABLETS BY MOUTH AT BEDTIME 60 tablet 2  . fluticasone furoate-vilanterol (BREO ELLIPTA) 100-25 MCG/INH AEPB Inhale 1 puff into the lungs daily. 28 each 0  . folic acid (FOLVITE) 1 MG tablet TAKE 2 TABLETS (2 MG TOTAL) BY MOUTH DAILY. (Patient taking differently: Take 1 mg by mouth 2 (two) times daily. ) 180 tablet 3  . hydrochlorothiazide (HYDRODIURIL) 25 MG tablet TAKE 1 TABLET BY MOUTH EVERY DAY 90 tablet 1  . losartan (COZAAR) 25 MG tablet Take 1 tablet (25 mg total) by mouth daily. 90  tablet 1  . methotrexate 50 MG/2ML injection Inject 0.6 mLs (15 mg total) into the skin once a week. 8 mL 0  . Multiple Vitamins-Minerals (ZINC PO) Take by mouth daily.    . Omega-3 Fatty Acids (FISH OIL) 1000 MG CAPS Take by mouth.    . pantoprazole (PROTONIX) 40 MG tablet TAKE 1 TABLET BY MOUTH TWICE A DAY 60 tablet 0  . potassium chloride SA (KLOR-CON) 20 MEQ tablet Take 1 tablet (20 mEq total) by mouth daily. 90 tablet 1  . predniSONE (DELTASONE) 5 MG tablet Take 2.5 mg by mouth daily.     . Tuberculin-Allergy Syringes (ALLERGY SYRINGE 1CC/27GX1/2") 27G X 1/2" 1 ML MISC Patient to use to inject MTX weekly 12 each 3  . valACYclovir (VALTREX) 500 MG tablet TAKE 1 TABLET (500 MG TOTAL) BY MOUTH 2 (TWO) TIMES DAILY. FOR 3 DAYS WITH ACUTE EPISODES. 18 tablet 1  . vitamin B-12 (CYANOCOBALAMIN) 100 MCG tablet Take 100 mcg by mouth daily.    Marland Kitchen VITAMIN D, ERGOCALCIFEROL, PO Take 1 capsule by mouth daily.    [name] XR 11 MG TB24 Take 1 tablet by mouth daily.    Marland Kitchen moxifloxacin (VIGAMOX) 0.5 % ophthalmic solution Apply 1 drop to eye 3 (three) times daily.    . ondansetron (ZOFRAN) 4 MG tablet Take 1 tablet (4 mg total) by mouth every 8 (eight) hours as needed for nausea or vomiting. 20 tablet 0  . senna-docusate (SENOKOT-S) 8.6-50 MG tablet Take 1 tablet by mouth 2 (two) times daily.    . DULoxetine (CYMBALTA) 30 MG capsule Take 1 capsule (30 mg total) by mouth daily. 90 capsule 2  . predniSONE (DELTASONE) 5 MG tablet Take 4 tabs po x 4 days, 3  tabs po x 4 days, 2  tabs po x 4 days, 1  tab po x 4 days 40 tablet 0   No facility-administered medications prior to visit.     Review of Systems:   Constitutional:   No  weight loss, night sweats,  Fevers, chills, fatigue, or  lassitude.  HEENT:   No headaches,  Difficulty swallowing,  Tooth/dental problems, or  Sore throat,                No sneezing, itching, ear ache,  +nasal congestion, post nasal drip,   CV:  No chest pain,  Orthopnea, PND,  swelling in lower extremities, anasarca, dizziness, palpitations, syncope.   GI  No heartburn, indigestion, abdominal pain, nausea, vomiting, diarrhea, change in bowel habits, loss of appetite, bloody stools.   Resp: No shortness of breath with exertion or at rest.  No excess mucus, no productive cough,  No non-productive cough,  No coughing up of blood.  No change in color of mucus.  No wheezing.  No chest wall  deformity  Skin: no rash or lesions.  GU: no dysuria, change in color of urine, no urgency or frequency.  No flank pain, no hematuria   MS:  No joint pain or swelling.  No decreased range of motion.  No back pain.    Physical Exam  BP 120/72 (BP Location: Left Arm, Cuff Size: Normal)   Pulse 92   Temp 97.7 F (36.5 C) (Oral)   Ht 5\' 5"  (1.651 m)   Wt 211 lb 6.4 oz (95.9 kg)   SpO2 97%   BMI 35.18 kg/m   GEN: A/Ox3; pleasant , NAD, well nourished    HEENT:  [place]/AT,   NOSE-clear, THROAT-clear, no lesions, no postnasal drip or exudate noted.   NECK:  Supple w/ fair ROM; no JVD; normal carotid impulses w/o bruits; no thyromegaly or nodules palpated; no lymphadenopathy.    RESP  Clear  P & A; w/o, wheezes/ rales/ or rhonchi. no accessory muscle use, no dullness to percussion  CARD:  RRR, no m/r/g, no peripheral edema, pulses intact, no cyanosis or clubbing.  GI:   Soft & nt; nml bowel sounds; no organomegaly or masses detected.   Musco: Warm bil, no deformities or joint swelling noted.   Neuro: alert, no focal deficits noted.    Skin: Warm, no lesions or rashes    Lab Results:   BMET  BNP No results found for: BNP  ProBNP  Imaging: No results found.    PFT Results Latest Ref Rng & Units 06/21/2019 03/30/2017  FVC-Pre L 2.80 2.89  FVC-Predicted Pre % 98 99  FVC-Post L 2.72 2.95  FVC-Predicted Post % 95 101  Pre FEV1/FVC % % 86 87  Post FEV1/FCV % % 90 90  FEV1-Pre L 2.42 2.51  FEV1-Predicted Pre % 107 109  FEV1-Post L 2.44 2.66  DLCO uncorrected  ml/min/mmHg 17.56 17.54  DLCO UNC% % 82 68  DLCO corrected ml/min/mmHg 18.34 17.88  DLCO COR %Predicted % 86 69  DLVA Predicted % 109 88  TLC L 3.64 4.08  TLC % Predicted % 69 78  RV % Predicted % 43 57    No results found for: NITRICOXIDE      Assessment & Plan:   Mild intermittent asthma Mild persistent asthma - need to take on regular basis   Plan  Patient Instructions  Restart BREO 1 puff daily, rinse after use .  Activity as tolerated.  Albuterol inhaler As needed   Follow up with Dr. Delton Coombes in 6 months and As needed       Allergic rhinitis Trigger prevention   Plan  Patient Instructions  Restart BREO 1 puff daily, rinse after use .  Activity as tolerated.  Albuterol inhaler As needed   Follow up with Dr. Delton Coombes in 6 months and As needed          Rubye Oaks, NP 08/29/2019

## 2019-08-29 NOTE — Assessment & Plan Note (Signed)
Trigger prevention   Plan  Patient Instructions  Restart BREO 1 puff daily, rinse after use .  Activity as tolerated.  Albuterol inhaler As needed   Follow up with Dr. Delton Coombes in 6 months and As needed

## 2019-09-15 ENCOUNTER — Other Ambulatory Visit: Payer: Self-pay | Admitting: Nurse Practitioner

## 2019-09-15 DIAGNOSIS — K2940 Chronic atrophic gastritis without bleeding: Secondary | ICD-10-CM

## 2019-09-15 DIAGNOSIS — R1013 Epigastric pain: Secondary | ICD-10-CM

## 2019-09-27 ENCOUNTER — Other Ambulatory Visit: Payer: Self-pay

## 2019-09-27 DIAGNOSIS — Z79899 Other long term (current) drug therapy: Secondary | ICD-10-CM

## 2019-09-28 LAB — CBC WITH DIFFERENTIAL/PLATELET
Absolute Monocytes: 209 cells/uL (ref 200–950)
Basophils Absolute: 40 cells/uL (ref 0–200)
Basophils Relative: 1.1 %
Eosinophils Absolute: 40 cells/uL (ref 15–500)
Eosinophils Relative: 1.1 %
HCT: 36.4 % (ref 35.0–45.0)
Hemoglobin: 12 g/dL (ref 11.7–15.5)
Lymphs Abs: 1055 cells/uL (ref 850–3900)
MCH: 28.4 pg (ref 27.0–33.0)
MCHC: 33 g/dL (ref 32.0–36.0)
MCV: 86.1 fL (ref 80.0–100.0)
MPV: 10.9 fL (ref 7.5–12.5)
Monocytes Relative: 5.8 %
Neutro Abs: 2257 cells/uL (ref 1500–7800)
Neutrophils Relative %: 62.7 %
Platelets: 271 10*3/uL (ref 140–400)
RBC: 4.23 10*6/uL (ref 3.80–5.10)
RDW: 14.4 % (ref 11.0–15.0)
Total Lymphocyte: 29.3 %
WBC: 3.6 10*3/uL — ABNORMAL LOW (ref 3.8–10.8)

## 2019-09-28 LAB — COMPLETE METABOLIC PANEL WITH GFR
AG Ratio: 1.4 (calc) (ref 1.0–2.5)
ALT: 13 U/L (ref 6–29)
AST: 17 U/L (ref 10–35)
Albumin: 4.3 g/dL (ref 3.6–5.1)
Alkaline phosphatase (APISO): 54 U/L (ref 37–153)
BUN: 15 mg/dL (ref 7–25)
CO2: 28 mmol/L (ref 20–32)
Calcium: 9.5 mg/dL (ref 8.6–10.4)
Chloride: 100 mmol/L (ref 98–110)
Creat: 0.91 mg/dL (ref 0.50–1.05)
GFR, Est African American: 81 mL/min/{1.73_m2} (ref >=60)
GFR, Est Non African American: 70 mL/min/{1.73_m2} (ref >=60)
Globulin: 3 g/dL (calc) (ref 1.9–3.7)
Glucose, Bld: 111 mg/dL — ABNORMAL HIGH (ref 65–99)
Potassium: 3.5 mmol/L (ref 3.5–5.3)
Sodium: 138 mmol/L (ref 135–146)
Total Bilirubin: 0.5 mg/dL (ref 0.2–1.2)
Total Protein: 7.3 g/dL (ref 6.1–8.1)

## 2019-09-28 NOTE — Progress Notes (Signed)
Glucose is 111. Rest of CMP WNL.  WBC count is low-3.6.  She is on Xeljanz and MTX 0.6 ml weekly.  Reviewed lab work with Dr. Corliss Skains.   Please advise the patient to have CBC rechecked in 1 month.

## 2019-09-29 NOTE — Progress Notes (Signed)
We would recommend MTX 0.6 ml sq injections once weekly.

## 2019-09-30 ENCOUNTER — Other Ambulatory Visit: Payer: Self-pay | Admitting: Physician Assistant

## 2019-10-02 NOTE — Telephone Encounter (Signed)
Last Visit: 04/26/2019 Next Visit: 11/20/2019 Labs: 09/27/2019 Glucose is 111. Rest of CMP WNL. WBC count is low-3.6.   Current Dose per office note on 04/26/2019: methotrexate 0.6 ml every 7 days Dx: Mixed connective tissue disease  Okay to refill MTX?

## 2019-10-04 ENCOUNTER — Telehealth: Payer: Self-pay | Admitting: Family Medicine

## 2019-10-04 NOTE — Telephone Encounter (Signed)
Dr. Trevor Iha from [facility] [place] services would like to speak to Dr. [place] regarding disability forms filled out on 04/14/19 for the pt. She has questions about the way PCP filled it out and work return.   Dr. Trevor Iha can be reached at [phone]

## 2019-10-05 ENCOUNTER — Telehealth: Payer: Self-pay | Admitting: Family Medicine

## 2019-10-05 NOTE — Telephone Encounter (Signed)
Dr. Sabino Donovan is calling in to speak with Dr. [place] about the pts disability form and claim and would like to have a call back as soon as possible to discuss.

## 2019-10-08 ENCOUNTER — Other Ambulatory Visit: Payer: Self-pay | Admitting: Nurse Practitioner

## 2019-10-08 DIAGNOSIS — K2940 Chronic atrophic gastritis without bleeding: Secondary | ICD-10-CM

## 2019-10-08 DIAGNOSIS — R1013 Epigastric pain: Secondary | ICD-10-CM

## 2019-10-09 NOTE — Telephone Encounter (Signed)
Tried to reach Dr Trevor Iha, no answer. Left a message in her voice mail. Serjio Deupree [place], MD

## 2019-10-13 NOTE — Telephone Encounter (Signed)
Can you please find out if Dr Sabino Donovan needs more information about FMLA. I tried to call her,left voice mail but she has not called back. Thanks

## 2019-10-13 NOTE — Telephone Encounter (Signed)
I left another voicemail for Dr. Trevor Iha to call the office back.

## 2019-10-16 ENCOUNTER — Ambulatory Visit (INDEPENDENT_AMBULATORY_CARE_PROVIDER_SITE_OTHER): Payer: BC Managed Care – PPO | Admitting: Physician Assistant

## 2019-10-16 ENCOUNTER — Encounter: Payer: Self-pay | Admitting: Physician Assistant

## 2019-10-16 ENCOUNTER — Telehealth: Payer: Self-pay | Admitting: Family Medicine

## 2019-10-16 ENCOUNTER — Other Ambulatory Visit: Payer: Self-pay

## 2019-10-16 VITALS — BP 129/82 | HR 83 | Resp 16 | Ht 65.0 in | Wt 208.8 lb

## 2019-10-16 DIAGNOSIS — F5101 Primary insomnia: Secondary | ICD-10-CM | POA: Diagnosis not present

## 2019-10-16 DIAGNOSIS — M797 Fibromyalgia: Secondary | ICD-10-CM | POA: Diagnosis not present

## 2019-10-16 DIAGNOSIS — Z8639 Personal history of other endocrine, nutritional and metabolic disease: Secondary | ICD-10-CM

## 2019-10-16 DIAGNOSIS — Z79899 Other long term (current) drug therapy: Secondary | ICD-10-CM

## 2019-10-16 DIAGNOSIS — M351 Other overlap syndromes: Secondary | ICD-10-CM

## 2019-10-16 DIAGNOSIS — Z8719 Personal history of other diseases of the digestive system: Secondary | ICD-10-CM

## 2019-10-16 DIAGNOSIS — Z8679 Personal history of other diseases of the circulatory system: Secondary | ICD-10-CM

## 2019-10-16 DIAGNOSIS — Z8711 Personal history of peptic ulcer disease: Secondary | ICD-10-CM

## 2019-10-16 DIAGNOSIS — M7062 Trochanteric bursitis, left hip: Secondary | ICD-10-CM

## 2019-10-16 DIAGNOSIS — Z8709 Personal history of other diseases of the respiratory system: Secondary | ICD-10-CM

## 2019-10-16 DIAGNOSIS — M7061 Trochanteric bursitis, right hip: Secondary | ICD-10-CM

## 2019-10-16 DIAGNOSIS — R5382 Chronic fatigue, unspecified: Secondary | ICD-10-CM

## 2019-10-16 MED ORDER — PREDNISONE 5 MG PO TABS: ORAL_TABLET | ORAL | 0 refills | Status: DC

## 2019-10-16 NOTE — Telephone Encounter (Signed)
As of 9/27, we have not received a fax.

## 2019-10-16 NOTE — Telephone Encounter (Signed)
Erie Noe from [facility] [place] would like to know if Dr. [place] received a fax for this pt; if so, please call  [phone]

## 2019-10-16 NOTE — Telephone Encounter (Signed)
Dr. Trevor Iha faxed a form to the office to be filled out. Form given to Dr. [place].

## 2019-10-16 NOTE — Progress Notes (Signed)
**Note Patricia-Identified via Obfuscation** Office Visit Note  [name]             Date of Birth: 05-07-1960           MRN: [id number]             PCP: [place], [name], MD Referring: [place], [name], MD Visit Date: 10/16/2019 Occupation: @GUAROCC @  Subjective:  Pain in multiple joints    History of Present Illness: Patricia Reed is a 59 y.o. female with history of mixed connective tissue disease.  She is currently taking xeljanz 11 mg XR daily and MTX 0.6 ml sq injections once weekly.  She recently reduced MTX from 0.7 to 0.6 ml weekly about 2 weeks ago due to low WBC count.  She is also taking long term prednisone 2.5 mg daily. She has not missed any doses of these medications recently.  She started having a flare 3 days ago, which has progressively been worsening.  She is having pain and inflammation in multiple joints including both hands, both wrist joints, both shoulder joints, both knees, and both ankle joints.  She has noticed swelling in her wrists and ankle joints.  She is having difficulty walking due to the discomfort she is experiencing.  Her fibromyalgia has also started to flare. She is having trapezius muscle tension and tenderness and feels as though her skin is burning. She has ongoing trochanteric bursitis of both hips.  She continues to see [facility].     Activities of Daily Living:  Patient reports morning stiffness for 4.5 hours.   Patient Reports nocturnal pain.  Difficulty dressing/grooming: Denies Difficulty climbing stairs: Denies Difficulty getting out of chair: Denies Difficulty using hands for taps, buttons, cutlery, and/or writing: Reports  Review of Systems  Constitutional: Positive for fatigue.  HENT: Positive for mouth sores and mouth dryness. Negative for nose dryness.   Eyes: Negative for pain, visual disturbance and dryness.  Respiratory: Positive for shortness of breath (Hx of asthma). Negative for cough, hemoptysis and difficulty breathing.   Cardiovascular:  Positive for swelling in legs/feet. Negative for chest pain, palpitations and hypertension.  Gastrointestinal: Positive for constipation and diarrhea. Negative for blood in stool.  Genitourinary: Negative for difficulty urinating and painful urination.  Musculoskeletal: Positive for arthralgias, gait problem, joint pain, joint swelling, muscle weakness, morning stiffness and muscle tenderness. Negative for myalgias and myalgias.  Skin: Negative for color change, pallor, rash, hair loss, nodules/bumps, skin tightness, ulcers and sensitivity to sunlight.  Allergic/Immunologic: Negative for susceptible to infections.  Neurological: Positive for weakness. Negative for dizziness and headaches.  Hematological: Positive for bruising/bleeding tendency. Negative for swollen glands.  Psychiatric/Behavioral: Positive for sleep disturbance. Negative for depressed mood. The patient is not nervous/anxious.     PMFS History:  Patient Active Problem List   Diagnosis Date Noted  . Hypokalemia 07/07/2019  . Precordial chest pain 05/04/2019  . Educated about COVID-19 virus infection 05/04/2019  . Persistent vomiting   . Abdominal pain, epigastric   . Nausea & vomiting 03/11/2019  . Gastritis   . Nausea without vomiting 02/28/2019  . Class 2 obesity with body mass index (BMI) of 37.0 to 37.9 in adult 11/25/2018  . Mixed connective tissue disease (HCC) 08/14/2017  . Recurrent genital herpes 05/12/2017  . Shortness of breath 04/15/2017  . Dizziness 02/04/2017  . Situational anxiety 06/08/2016  . Atypical chest pain 06/08/2016  . Trapezius muscle spasm 04/23/2016  . Fibromyalgia 02/13/2016  . 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 Chronic  fatigue 02/13/2016  . Vitamin D deficiency 11/25/2015  . Autoimmune disease (HCC) 11/23/2015  . High risk medication use 11/23/2015  . Colon cancer screening 01/25/2014  . Anterior neck pain 12/01/2013  . Inflammatory arthritis 08/18/2012  . Allergic rhinitis 06/23/2012  .  HTN (hypertension) 06/23/2012  . High cholesterol 06/23/2012  . Thyroid nodule - benign 06/23/2012  . Arthralgia 06/23/2012  . Mild intermittent asthma   . Sickle cell trait (HCC)   . History of stomach ulcers   . ESOPHAGEAL STRICTURE 10/04/2007  . GERD 10/04/2007  . 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 Dysphagia, pharyngoesophageal phase 10/04/2007    Past Medical History:  Diagnosis Date  . Allergy   . Anemia   . Asthma   . Blood transfusion without reported diagnosis   . Fibromyalgia   . GERD (gastroesophageal reflux disease)   . Heart murmur   . High cholesterol   . History of stomach ulcers   . Hypertension   . Lupus (HCC)   . MI, old   . Rheumatoid arthritis (HCC)   . Thyroid disease   . Thyroid mass   . Vertigo     Family History  Problem Relation Age of Onset  . Alcohol abuse Father   . Hypertension Father   . Heart disease Father   . Other Mother        tobacco use disorder, refuses to see a doctor  . Osteoporosis Sister   . Colon cancer Maternal Uncle   . Diabetes Maternal Grandmother   . Healthy Son   . Healthy Daughter   . Multiple sclerosis Daughter   . Colon polyps Neg Hx   . Esophageal cancer Neg Hx   . Kidney disease Neg Hx   . Stomach cancer Neg Hx   . Rectal cancer Neg Hx    Past Surgical History:  Procedure Laterality Date  . ABDOMINAL HYSTERECTOMY     done for menorrhagia, ovaries remain  . CERVICAL DISCECTOMY     plates and screws in neck  . CESAREAN SECTION    . COLONOSCOPY    . ESOPHAGEAL MANOMETRY N/A 02/12/2014   Procedure: ESOPHAGEAL MANOMETRY (EM);  Surgeon: Louis Meckel, MD;  Location: [facility];  Service: Endoscopy;  Laterality: N/A;  . ESOPHAGOGASTRODUODENOSCOPY  03/04/2011   Procedure: ESOPHAGOGASTRODUODENOSCOPY (EGD);  Surgeon: Louis Meckel, MD;  Location: [facility];  Service: Endoscopy;  Laterality: N/A;  BOTOX Injection  . ESOPHAGOGASTRODUODENOSCOPY (EGD) WITH PROPOFOL N/A 03/12/2019   Procedure:  ESOPHAGOGASTRODUODENOSCOPY (EGD) WITH PROPOFOL;  Surgeon: Jeani Hawking, MD;  Location: [facility] ENDOSCOPY;  Service: Endoscopy;  Laterality: N/A;  . exc benign breast lump    . TONSILLECTOMY    . 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     Immunization History  Administered Date(s) Administered  . Influenza, Quadrivalent, Recombinant, Inj, Pf 10/11/2017  . Influenza,inj,Quad PF,6+ Mos 11/04/2015, 10/21/2018  . PFIZER SARS-COV-2 Vaccination 03/20/2019, 04/12/2019  . Zoster Recombinat (Shingrix) 10/11/2017, 12/25/2017     Objective: Vital Signs: BP 129/82 (BP Location: Left Arm, Patient Position: Sitting, Cuff Size: Normal)   Pulse 83   Resp 16   Ht 5\' 5"  (1.651 m)   Wt 208 lb 12.8 oz (94.7 kg)   BMI 34.75 kg/m    Physical Exam Vitals and nursing note reviewed.  Constitutional:      Appearance: She is well-developed.  HENT:  Head: Normocephalic and atraumatic.  Eyes:     Conjunctiva/sclera: Conjunctivae normal.  Pulmonary:     Effort: Pulmonary effort is normal.  Abdominal:     Palpations: Abdomen is soft.  Musculoskeletal:     Cervical back: Normal range of motion.  Skin:    General: Skin is warm and dry.     Capillary Refill: Capillary refill takes less than 2 seconds.  Neurological:     Mental Status: She is alert and oriented to person, place, and time.  Psychiatric:        Behavior: Behavior normal.      Musculoskeletal Exam: C-spine limited ROM with lateral rotation.  Shoulder joint abduction to 120 degrees bilaterally. Elbow joints good ROM with no tenderness or inflammation.  Tenderness and synovitis of both wrist joints noted.  Tenderness and synovitis of the right 2nd and 3rd MCP joints.  Difficulty making a complete fist bilaterally.  Knee joints have painful ROM with crepitus bilaterally.  Tenderness and swelling of both ankle joints noted on  exam.    CDAI Exam: CDAI Score: 13.8  Patient Global: 10 mm; Provider Global: 8 mm Swollen: 6 ; Tender: 10  Joint Exam 10/16/2019      Right  Left  Glenohumeral   Tender   Tender  Wrist  Swollen Tender  Swollen Tender  MCP 2  Swollen Tender     MCP 3  Swollen Tender     Knee   Tender   Tender  Ankle  Swollen Tender  Swollen Tender     Investigation: No additional findings.  Imaging: No results found.  Recent Labs: Lab Results  Component Value Date   WBC 3.6 (L) 09/27/2019   HGB 12.0 09/27/2019   PLT 271 09/27/2019   NA 138 09/27/2019   K 3.5 09/27/2019   CL 100 09/27/2019   CO2 28 09/27/2019   GLUCOSE 111 (H) 09/27/2019   BUN 15 09/27/2019   CREATININE 0.91 09/27/2019   BILITOT 0.5 09/27/2019   ALKPHOS 53 05/27/2019   AST 17 09/27/2019   ALT 13 09/27/2019   PROT 7.3 09/27/2019   ALBUMIN 3.9 05/27/2019   CALCIUM 9.5 09/27/2019   GFRAA 81 09/27/2019   QFTBGOLDPLUS NEGATIVE 07/26/2018    Speciality Comments: No specialty comments available.  Procedures:  No procedures performed Allergies: Aspirin, Demerol [meperidine], Hydrocodone-acetaminophen, Ibuprofen, Imdur [isosorbide nitrate], Meperidine hcl, Morphine, Oxycodone-acetaminophen, Procaine hcl, Toprol xl [metoprolol], and Tramadol   Assessment / Plan:     Visit Diagnoses: Mixed connective tissue disease (HCC) - +ANA,+Sm,+RNP,+RF, arthritis: She presents today with pain and synovitis in multiple joints.  She has been experiencing increased discomfort, joint stiffness, joint swelling for the past 3 days. She is also having a fibromyalgia flare and experiencing myalgias and fatigue.  She is currently taking Xeljanz 11 mg XR daily, methotrexate 0.6 mL sq injections once weekly, folic acid 2 mg a mouth daily, and long-term prednisone 2.5 mg daily.  She reduced the dose of methotrexate from 0.7 to 0.6 mL weekly after her recent lab work on 09/27/2019 revealed low white blood cell count.  She has not missed any doses of  Xeljanz or methotrexate recently.  This is her first flare in several months.  She has been under tremendous amount of stress at work and also attributes some of her increased discomfort due to recent weather changes.  She continues to follow up with Dr. Tedd Sias at [facility] rheumatology.  Lab work from 06/23/19 was reviewed today in the office.  We will repeat lab work today.  We will send in a prednisone taper starting at 20 mg tapering by 5 mg every 4 days.  She will then continue taking prednisone 2.5 mg daily which is her long-term dose. She will continue on xeljanz and MTX as prescribed. She will follow up in 3 months. - Plan: CBC with Differential/Platelet, Anti-DNA antibody, double-stranded, C3 and C4, Sedimentation rate, predniSONE (DELTASONE) 5 MG tablet  High risk medication use - Xeljanz 11 mg XR by mouth daily, MTX 0.6 ml sq once weekly (reduced dose low WBC count), and folic acid 2 mg daily. CBC and CMP were drawn on 09/27/19.  She reduced the dose of MTX from 0.7 to 0.6 due to drop in WBC count on 09/27/19. We will update lab work today.    - Plan: COMPLETE METABOLIC PANEL WITH GFR, Urinalysis, Routine w reflex microscopic She has received both COVID-19 vaccine doses. She is aware that she is to hold [place] and methotrexate if she develop signs or symptoms of an infection and to resume once the infection has completely cleared.  Fibromyalgia: She has generalized hyperalgesia and positive tender points on exam.  She is currently having a fibromyalgia flare which started about 3 days ago.  She is having generalized muscle aches and muscle tenderness.  She is also having hypersensitivity of her skin.  She has trapezius muscle tension and muscle tenderness bilaterally and ongoing trochanteric bursitis of both hips.  She continues to take baclofen 10 mg 1 tablet by mouth at bedtime for muscle spasms.  She has been having difficulty sleeping a day to the severity of pain she has been experiencing.   Primary  insomnia: She takes baclofen 10 mg 1 tablet by mouth at bedtime for insomnia and muscle spasms.    Chronic fatigue:  She has been experiencing significant fatigue for the past 3-4 days.   History of vitamin D deficiency: She is taking a vitamin D supplement daily.   Other medical conditions are listed as follows:   History of stomach ulcers  History of high cholesterol  History of gastroesophageal reflux (GERD)  History of hypertension  History of asthma  Trochanteric bursitis of both hips  Orders: Orders Placed This Encounter  Procedures  . COMPLETE METABOLIC PANEL WITH GFR  . Urinalysis, Routine w reflex microscopic  . CBC with Differential/Platelet  . Anti-DNA antibody, double-stranded  . C3 and C4  . Sedimentation rate   Meds ordered this encounter  Medications  . predniSONE (DELTASONE) 5 MG tablet    Sig: Take 4 tablets by mouth daily x4 days, 3 tablets by mouth daily x4 days, 2 tablets by mouth daily x4 days, 1 tablet by mouth daily x4 days.    Dispense:  40 tablet    Refill:  0      Follow-Up Instructions: Return in about 3 months (around 01/15/2020) for Mixed connective tissue disease , Fibromyalgia.   Gearldine Bienenstock, PA-C  Note - This record has been created using Dragon software.  Chart creation errors have been sought, but may not always  have been located. Such creation errors do not reflect on  the standard of medical care.

## 2019-10-17 NOTE — Progress Notes (Signed)
DsDNA is negative.

## 2019-10-17 NOTE — Progress Notes (Signed)
Please add protein creatinine ratio.  UA revealed trace protein.  CMP WNL.  ESR and complements WNL.  DsDNA pending.

## 2019-10-18 LAB — CBC WITH DIFFERENTIAL/PLATELET
Absolute Monocytes: 198 cells/uL — ABNORMAL LOW (ref 200–950)
Basophils Absolute: 30 cells/uL (ref 0–200)
Basophils Relative: 0.9 %
Eosinophils Absolute: 50 cells/uL (ref 15–500)
Eosinophils Relative: 1.5 %
HCT: 39.5 % (ref 35.0–45.0)
Hemoglobin: 12.7 g/dL (ref 11.7–15.5)
Lymphs Abs: 257 cells/uL — ABNORMAL LOW (ref 850–3900)
MCH: 28.3 pg (ref 27.0–33.0)
MCHC: 32.2 g/dL (ref 32.0–36.0)
MCV: 88.2 fL (ref 80.0–100.0)
MPV: 11 fL (ref 7.5–12.5)
Monocytes Relative: 6 %
Neutro Abs: 2765 cells/uL (ref 1500–7800)
Neutrophils Relative %: 83.8 %
Platelets: 275 10*3/uL (ref 140–400)
RBC: 4.48 10*6/uL (ref 3.80–5.10)
RDW: 14.4 % (ref 11.0–15.0)
Total Lymphocyte: 7.8 %
WBC: 3.3 10*3/uL — ABNORMAL LOW (ref 3.8–10.8)

## 2019-10-18 LAB — TEST AUTHORIZATION

## 2019-10-18 LAB — C3 AND C4
C3 Complement: 170 mg/dL (ref 83–193)
C4 Complement: 38 mg/dL (ref 15–57)

## 2019-10-18 LAB — URINALYSIS, ROUTINE W REFLEX MICROSCOPIC
Bacteria, UA: NONE SEEN /HPF
Bilirubin Urine: NEGATIVE
Glucose, UA: NEGATIVE
Hgb urine dipstick: NEGATIVE
Hyaline Cast: NONE SEEN /LPF
Leukocytes,Ua: NEGATIVE
Nitrite: NEGATIVE
RBC / HPF: NONE SEEN /HPF (ref 0–2)
Specific Gravity, Urine: 1.027 (ref 1.001–1.03)
pH: 7.5 (ref 5.0–8.0)

## 2019-10-18 LAB — ANTI-DNA ANTIBODY, DOUBLE-STRANDED: ds DNA Ab: 1 IU/mL

## 2019-10-18 LAB — PROTEIN / CREATININE RATIO, URINE
Creatinine, Urine: 263 mg/dL (ref 20–275)
Protein/Creat Ratio: 68 mg/g creat (ref 21–161)
Protein/Creatinine Ratio: 0.068 mg/mg creat (ref 0.021–0.16)
Total Protein, Urine: 18 mg/dL (ref 5–24)

## 2019-10-18 LAB — COMPLETE METABOLIC PANEL WITH GFR
AG Ratio: 1.4 (calc) (ref 1.0–2.5)
ALT: 9 U/L (ref 6–29)
AST: 16 U/L (ref 10–35)
Albumin: 4.1 g/dL (ref 3.6–5.1)
Alkaline phosphatase (APISO): 52 U/L (ref 37–153)
BUN: 11 mg/dL (ref 7–25)
CO2: 28 mmol/L (ref 20–32)
Calcium: 9.1 mg/dL (ref 8.6–10.4)
Chloride: 102 mmol/L (ref 98–110)
Creat: 0.85 mg/dL (ref 0.50–1.05)
GFR, Est African American: 88 mL/min/{1.73_m2} (ref >=60)
GFR, Est Non African American: 76 mL/min/{1.73_m2} (ref >=60)
Globulin: 2.9 g/dL (calc) (ref 1.9–3.7)
Glucose, Bld: 95 mg/dL (ref 65–99)
Potassium: 3.6 mmol/L (ref 3.5–5.3)
Sodium: 140 mmol/L (ref 135–146)
Total Bilirubin: 0.7 mg/dL (ref 0.2–1.2)
Total Protein: 7 g/dL (ref 6.1–8.1)

## 2019-10-18 LAB — SEDIMENTATION RATE: Sed Rate: 29 mm/h (ref 0–30)

## 2019-10-18 NOTE — Progress Notes (Signed)
Protein creatinine ratio is WNL. She should complete the prednisone taper and continue taking maintenance dose of 2.5 mg daily.  Please advise the patient to notify us if her joint pain and inflammation persists or worsens.

## 2019-10-19 ENCOUNTER — Other Ambulatory Visit: Payer: Self-pay | Admitting: Family Medicine

## 2019-10-19 DIAGNOSIS — K219 Gastro-esophageal reflux disease without esophagitis: Secondary | ICD-10-CM

## 2019-10-26 ENCOUNTER — Other Ambulatory Visit: Payer: Self-pay | Admitting: Family Medicine

## 2019-10-26 DIAGNOSIS — I10 Essential (primary) hypertension: Secondary | ICD-10-CM

## 2019-10-31 ENCOUNTER — Other Ambulatory Visit: Payer: Self-pay | Admitting: Nurse Practitioner

## 2019-10-31 DIAGNOSIS — R1013 Epigastric pain: Secondary | ICD-10-CM

## 2019-10-31 DIAGNOSIS — K2940 Chronic atrophic gastritis without bleeding: Secondary | ICD-10-CM

## 2019-11-20 ENCOUNTER — Ambulatory Visit: Payer: BC Managed Care – PPO | Admitting: Rheumatology

## 2019-11-28 ENCOUNTER — Other Ambulatory Visit: Payer: Self-pay | Admitting: Nurse Practitioner

## 2019-11-28 DIAGNOSIS — K2940 Chronic atrophic gastritis without bleeding: Secondary | ICD-10-CM

## 2019-11-28 DIAGNOSIS — R1013 Epigastric pain: Secondary | ICD-10-CM

## 2019-12-15 ENCOUNTER — Other Ambulatory Visit: Payer: Self-pay | Admitting: Family Medicine

## 2019-12-15 DIAGNOSIS — I10 Essential (primary) hypertension: Secondary | ICD-10-CM

## 2019-12-25 ENCOUNTER — Other Ambulatory Visit: Payer: Self-pay | Admitting: Nurse Practitioner

## 2019-12-25 DIAGNOSIS — R1013 Epigastric pain: Secondary | ICD-10-CM

## 2019-12-25 DIAGNOSIS — K2940 Chronic atrophic gastritis without bleeding: Secondary | ICD-10-CM

## 2019-12-25 NOTE — Telephone Encounter (Signed)
Beth, ok to refill Pantoprazole 40mg  one po bid # 60. Patient was previously advised to schedule a follow up appt. Please contact her and schedule a follow up appt. Thx

## 2019-12-27 ENCOUNTER — Ambulatory Visit (INDEPENDENT_AMBULATORY_CARE_PROVIDER_SITE_OTHER): Payer: BC Managed Care – PPO | Admitting: Physician Assistant

## 2019-12-27 ENCOUNTER — Other Ambulatory Visit: Payer: Self-pay | Admitting: Physician Assistant

## 2019-12-27 ENCOUNTER — Other Ambulatory Visit: Payer: Self-pay

## 2019-12-27 ENCOUNTER — Telehealth: Payer: Self-pay

## 2019-12-27 ENCOUNTER — Encounter: Payer: Self-pay | Admitting: Physician Assistant

## 2019-12-27 VITALS — BP 122/81 | HR 65 | Resp 15 | Ht 65.0 in | Wt 206.0 lb

## 2019-12-27 DIAGNOSIS — F5101 Primary insomnia: Secondary | ICD-10-CM

## 2019-12-27 DIAGNOSIS — Z8709 Personal history of other diseases of the respiratory system: Secondary | ICD-10-CM

## 2019-12-27 DIAGNOSIS — Z8639 Personal history of other endocrine, nutritional and metabolic disease: Secondary | ICD-10-CM

## 2019-12-27 DIAGNOSIS — R5382 Chronic fatigue, unspecified: Secondary | ICD-10-CM

## 2019-12-27 DIAGNOSIS — Z79899 Other long term (current) drug therapy: Secondary | ICD-10-CM

## 2019-12-27 DIAGNOSIS — Z111 Encounter for screening for respiratory tuberculosis: Secondary | ICD-10-CM

## 2019-12-27 DIAGNOSIS — M351 Other overlap syndromes: Secondary | ICD-10-CM | POA: Diagnosis not present

## 2019-12-27 DIAGNOSIS — M25512 Pain in left shoulder: Secondary | ICD-10-CM

## 2019-12-27 DIAGNOSIS — Z8719 Personal history of other diseases of the digestive system: Secondary | ICD-10-CM

## 2019-12-27 DIAGNOSIS — G8929 Other chronic pain: Secondary | ICD-10-CM

## 2019-12-27 DIAGNOSIS — Z8679 Personal history of other diseases of the circulatory system: Secondary | ICD-10-CM

## 2019-12-27 DIAGNOSIS — M797 Fibromyalgia: Secondary | ICD-10-CM | POA: Diagnosis not present

## 2019-12-27 DIAGNOSIS — Z8711 Personal history of peptic ulcer disease: Secondary | ICD-10-CM

## 2019-12-27 DIAGNOSIS — M7062 Trochanteric bursitis, left hip: Secondary | ICD-10-CM

## 2019-12-27 DIAGNOSIS — M7061 Trochanteric bursitis, right hip: Secondary | ICD-10-CM

## 2019-12-27 DIAGNOSIS — M25511 Pain in right shoulder: Secondary | ICD-10-CM | POA: Diagnosis not present

## 2019-12-27 MED ORDER — LIDOCAINE HCL 1 % IJ SOLN
1.0000 mL | INTRAMUSCULAR | Status: AC | PRN
  Administered 2019-12-27: 1 mL

## 2019-12-27 MED ORDER — TRIAMCINOLONE ACETONIDE 40 MG/ML IJ SUSP
40.0000 mg | INTRAMUSCULAR | Status: AC | PRN
  Administered 2019-12-27: 40 mg via INTRA_ARTICULAR

## 2019-12-27 NOTE — Patient Instructions (Addendum)
Standing Labs We placed an order today for your standing lab work.   Please have your standing labs drawn in December and every 3 months   If possible, please have your labs drawn 2 weeks prior to your appointment so that the provider can discuss your results at your appointment.  We have open lab daily Monday through Thursday from 8:30-12:30 PM and 1:30-4:30 PM and Friday from 8:30-12:30 PM and 1:30-4:00 PM at the office of Dr. Pollyann Savoy, [facility].   Please be advised, patients with office appointments requiring lab work will take precedents over walk-in lab work.  If possible, please come for your lab work on Monday and Friday afternoons, as you may experience shorter wait times. The office is located at [address], [place], [place] [address] No appointment is necessary.   Labs are drawn by [facility]. Please bring your co-pay at the time of your lab draw.  You may receive a bill from [facility] for your lab work.  If you wish to have your labs drawn at another location, please call the office 24 hours in advance to send orders.  If you have any questions regarding directions or hours of operation,  please call [phone].   As a reminder, please drink plenty of water prior to coming for your lab work. Thanks!    Hip Bursitis Rehab Ask your health care provider which exercises are safe for you. Do exercises exactly as told by your health care provider and adjust them as directed. It is normal to feel mild stretching, pulling, tightness, or discomfort as you do these exercises. Stop right away if you feel sudden pain or your pain gets worse. Do not begin these exercises until told by your health care provider. Stretching exercise This exercise warms up your muscles and joints and improves the movement and flexibility of your hip. This exercise also helps to relieve pain and stiffness. Iliotibial band stretch An iliotibial band is a strong band of muscle  tissue that runs from the outer side of your hip to the outer side of your thigh and knee. 1. Lie on your side with your left / right leg in the top position. 2. Bend your left / right knee and grab your ankle. Stretch out your bottom arm to help you balance. 3. Slowly bring your knee back so your thigh is behind your body. 4. Slowly lower your knee toward the floor until you feel a gentle stretch on the outside of your left / right thigh. If you do not feel a stretch and your knee will not fall farther, place the heel of your other foot on top of your knee and pull your knee down toward the floor with your foot. 5. Hold this position for __________ seconds. 6. Slowly return to the starting position. Repeat __________ times. Complete this exercise __________ times a day. Strengthening exercises These exercises build strength and endurance in your hip and pelvis. Endurance is the ability to use your muscles for a long time, even after they get tired. Bridge This exercise strengthens the muscles that move your thigh backward (hip extensors). 1. Lie on your back on a firm surface with your knees bent and your feet flat on the floor. 2. Tighten your buttocks muscles and lift your buttocks off the floor until your trunk is level with your thighs. ? Do not arch your back. ? You should feel the muscles working in your buttocks and the back of your thighs. If  you do not feel these muscles, slide your feet 1-2 inches (2.5-5 cm) farther away from your buttocks. ? If this exercise is too easy, try doing it with your arms crossed over your chest. 3. Hold this position for __________ seconds. 4. Slowly lower your hips to the starting position. 5. Let your muscles relax completely after each repetition. Repeat __________ times. Complete this exercise __________ times a day. Squats This exercise strengthens the muscles in front of your thigh and knee (quadriceps). 1. Stand in front of a table, with your feet  and knees pointing straight ahead. You may rest your hands on the table for balance but not for support. 2. Slowly bend your knees and lower your hips like you are going to sit in a chair. ? Keep your weight over your heels, not over your toes. ? Keep your lower legs upright so they are parallel with the table legs. ? Do not let your hips go lower than your knees. ? Do not bend lower than told by your health care provider. ? If your hip pain increases, do not bend as low. 3. Hold the squat position for __________ seconds. 4. Slowly push with your legs to return to standing. Do not use your hands to pull yourself to standing. Repeat __________ times. Complete this exercise __________ times a day. Hip hike 1. Stand sideways on a bottom step. Stand on your left / right leg with your other foot unsupported next to the step. You can hold on to the railing or wall for balance if needed. 2. Keep your knees straight and your torso square. Then lift your left / right hip up toward the ceiling. 3. Hold this position for __________ seconds. 4. Slowly let your left / right hip lower toward the floor, past the starting position. Your foot should get closer to the floor. Do not lean or bend your knees. Repeat __________ times. Complete this exercise __________ times a day. Single leg stand 1. Without shoes, stand near a railing or in a doorway. You may hold on to the railing or door frame as needed for balance. 2. Squeeze your left / right buttock muscles, then lift up your other foot. ? Do not let your left / right hip push out to the side. ? It is helpful to stand in front of a mirror for this exercise so you can watch your hip. 3. Hold this position for __________ seconds. Repeat __________ times. Complete this exercise __________ times a day. This information is not intended to replace advice given to you by your health care provider. Make sure you discuss any questions you have with your health care  provider. Document Revised: 05/02/2018 Document Reviewed: 05/02/2018 [employer]vier Patient Education  2020 [employer] Shoulder Exercises Ask your health care provider which exercises are safe for you. Do exercises exactly as told by your health care provider and adjust them as directed. It is normal to feel mild stretching, pulling, tightness, or discomfort as you do these exercises. Stop right away if you feel sudden pain or your pain gets worse. Do not begin these exercises until told by your health care provider. Stretching exercises External rotation and abduction This exercise is sometimes called corner stretch. This exercise rotates your arm outward (external rotation) and moves your arm out from your body (abduction). 1. Stand in a doorway with one of your feet slightly in front of the other. This is called a staggered stance. If you cannot reach your forearms to the  door frame, stand facing a corner of a room. 2. Choose one of the following positions as told by your health care provider: ? Place your hands and forearms on the door frame above your head. ? Place your hands and forearms on the door frame at the height of your head. ? Place your hands on the door frame at the height of your elbows. 3. Slowly move your weight onto your front foot until you feel a stretch across your chest and in the front of your shoulders. Keep your head and chest upright and keep your abdominal muscles tight. 4. Hold for __________ seconds. 5. To release the stretch, shift your weight to your back foot. Repeat __________ times. Complete this exercise __________ times a day. Extension, standing 1. Stand and hold a broomstick, a cane, or a similar object behind your back. ? Your hands should be a little wider than shoulder width apart. ? Your palms should face away from your back. 2. Keeping your elbows straight and your shoulder muscles relaxed, move the stick away from your body until you feel a stretch in  your shoulders (extension). ? Avoid shrugging your shoulders while you move the stick. Keep your shoulder blades tucked down toward the middle of your back. 3. Hold for __________ seconds. 4. Slowly return to the starting position. Repeat __________ times. Complete this exercise __________ times a day. Range-of-motion exercises Pendulum  1. Stand near a wall or a surface that you can hold onto for balance. 2. Bend at the waist and let your left / right arm hang straight down. Use your other arm to support you. Keep your back straight and do not lock your knees. 3. Relax your left / right arm and shoulder muscles, and move your hips and your trunk so your left / right arm swings freely. Your arm should swing because of the motion of your body, not because you are using your arm or shoulder muscles. 4. Keep moving your hips and trunk so your arm swings in the following directions, as told by your health care provider: ? Side to side. ? Forward and backward. ? In clockwise and counterclockwise circles. 5. Continue each motion for __________ seconds, or for as long as told by your health care provider. 6. Slowly return to the starting position. Repeat __________ times. Complete this exercise __________ times a day. Shoulder flexion, standing  1. Stand and hold a broomstick, a cane, or a similar object. Place your hands a little more than shoulder width apart on the object. Your left / right hand should be palm up, and your other hand should be palm down. 2. Keep your elbow straight and your shoulder muscles relaxed. Push the stick up with your healthy arm to raise your left / right arm in front of your body, and then over your head until you feel a stretch in your shoulder (flexion). ? Avoid shrugging your shoulder while you raise your arm. Keep your shoulder blade tucked down toward the middle of your back. 3. Hold for __________ seconds. 4. Slowly return to the starting position. Repeat  __________ times. Complete this exercise __________ times a day. Shoulder abduction, standing 1. Stand and hold a broomstick, a cane, or a similar object. Place your hands a little more than shoulder width apart on the object. Your left / right hand should be palm up, and your other hand should be palm down. 2. Keep your elbow straight and your shoulder muscles relaxed. Push the object across  your body toward your left / right side. Raise your left / right arm to the side of your body (abduction) until you feel a stretch in your shoulder. ? Do not raise your arm above shoulder height unless your health care provider tells you to do that. ? If directed, raise your arm over your head. ? Avoid shrugging your shoulder while you raise your arm. Keep your shoulder blade tucked down toward the middle of your back. 3. Hold for __________ seconds. 4. Slowly return to the starting position. Repeat __________ times. Complete this exercise __________ times a day. Internal rotation  1. Place your left / right hand behind your back, palm up. 2. Use your other hand to dangle an exercise band, a towel, or a similar object over your shoulder. Grasp the band with your left / right hand so you are holding on to both ends. 3. Gently pull up on the band until you feel a stretch in the front of your left / right shoulder. The movement of your arm toward the center of your body is called internal rotation. ? Avoid shrugging your shoulder while you raise your arm. Keep your shoulder blade tucked down toward the middle of your back. 4. Hold for __________ seconds. 5. Release the stretch by letting go of the band and lowering your hands. Repeat __________ times. Complete this exercise __________ times a day. Strengthening exercises External rotation  1. Sit in a stable chair without armrests. 2. Secure an exercise band to a stable object at elbow height on your left / right side. 3. Place a soft object, such as a  folded towel or a small pillow, between your left / right upper arm and your body to move your elbow about 4 inches (10 cm) away from your side. 4. Hold the end of the exercise band so it is tight and there is no slack. 5. Keeping your elbow pressed against the soft object, slowly move your forearm out, away from your abdomen (external rotation). Keep your body steady so only your forearm moves. 6. Hold for __________ seconds. 7. Slowly return to the starting position. Repeat __________ times. Complete this exercise __________ times a day. Shoulder abduction  1. Sit in a stable chair without armrests, or stand up. 2. Hold a __________ weight in your left / right hand, or hold an exercise band with both hands. 3. Start with your arms straight down and your left / right palm facing in, toward your body. 4. Slowly lift your left / right hand out to your side (abduction). Do not lift your hand above shoulder height unless your health care provider tells you that this is safe. ? Keep your arms straight. ? Avoid shrugging your shoulder while you do this movement. Keep your shoulder blade tucked down toward the middle of your back. 5. Hold for __________ seconds. 6. Slowly lower your arm, and return to the starting position. Repeat __________ times. Complete this exercise __________ times a day. Shoulder extension 1. Sit in a stable chair without armrests, or stand up. 2. Secure an exercise band to a stable object in front of you so it is at shoulder height. 3. Hold one end of the exercise band in each hand. Your palms should face each other. 4. Straighten your elbows and lift your hands up to shoulder height. 5. Step back, away from the secured end of the exercise band, until the band is tight and there is no slack. 6. Squeeze your shoulder blades  together as you pull your hands down to the sides of your thighs (extension). Stop when your hands are straight down by your sides. Do not let your hands  go behind your body. 7. Hold for __________ seconds. 8. Slowly return to the starting position. Repeat __________ times. Complete this exercise __________ times a day. Shoulder row 1. Sit in a stable chair without armrests, or stand up. 2. Secure an exercise band to a stable object in front of you so it is at waist height. 3. Hold one end of the exercise band in each hand. Position your palms so that your thumbs are facing the ceiling (neutral position). 4. Bend each of your elbows to a 90-degree angle (right angle) and keep your upper arms at your sides. 5. Step back until the band is tight and there is no slack. 6. Slowly pull your elbows back behind you. 7. Hold for __________ seconds. 8. Slowly return to the starting position. Repeat __________ times. Complete this exercise __________ times a day. Shoulder press-ups  1. Sit in a stable chair that has armrests. Sit upright, with your feet flat on the floor. 2. Put your hands on the armrests so your elbows are bent and your fingers are pointing forward. Your hands should be about even with the sides of your body. 3. Push down on the armrests and use your arms to lift yourself off the chair. Straighten your elbows and lift yourself up as much as you comfortably can. ? Move your shoulder blades down, and avoid letting your shoulders move up toward your ears. ? Keep your feet on the ground. As you get stronger, your feet should support less of your body weight as you lift yourself up. 4. Hold for __________ seconds. 5. Slowly lower yourself back into the chair. Repeat __________ times. Complete this exercise __________ times a day. Wall push-ups  1. Stand so you are facing a stable wall. Your feet should be about one arm-length away from the wall. 2. Lean forward and place your palms on the wall at shoulder height. 3. Keep your feet flat on the floor as you bend your elbows and lean forward toward the wall. 4. Hold for __________  seconds. 5. Straighten your elbows to push yourself back to the starting position. Repeat __________ times. Complete this exercise __________ times a day. This information is not intended to replace advice given to you by your health care provider. Make sure you discuss any questions you have with your health care provider. Document Revised: 04/29/2018 Document Reviewed: 02/04/2018 [employer] Patient Education  2020 [employer].

## 2019-12-27 NOTE — Progress Notes (Signed)
Office Visit Note  [name]             Date of Birth: 22-Apr-1960           MRN: [id number]             PCP: [place], [name], MD Referring: [place], [name], MD Visit Date: 12/27/2019 Occupation: @GUAROCC @  Subjective:  Pain in both shoulder joints   History of Present Illness: Patricia Reed is a 59 y.o. female  With history of mixed connective tissue disease. She is currently taking Xeljanz 11 mg XR daily, methotrexate 0.6 mL sq injections once weekly, folic acid 2 mg a mouth daily, and long-term prednisone 2.5 mg daily.  She has not missed any doses recently.  Patient presents today with pain in both shoulder joints.  She states that the pain started on Monday and has progressively been worsening.  She has been experiencing nocturnal pain and difficulty with range of motion due to the severity of pain.  She would like cortisone injections performed bilaterally.  The cortisone injections performed in January 2021 provided significant relief.  She continues to experience intermittent pain and stiffness in both hands but denies any joint swelling at this time.  She has had more frequent symptoms of Raynaud's with the cooler weather temperatures.  She denies any digital ulcerations.  She states that she continues to have Maller rash intermittently and applies Vaseline or cocoa butter topically.  She has not been wearing sunscreen.  She denies any oral or nasal ulcerations.  She denies any sicca symptoms. Patient reports that she has been having less frequent fibromyalgia flares since retiring.  She has ongoing trochanter bursitis of both hips and has trapezius muscle tension and tenderness bilaterally.  Activities of Daily Living:  Patient reports morning stiffness for 5  hours.   Patient Reports nocturnal pain.  Difficulty dressing/grooming: Reports Difficulty climbing stairs: Denies Difficulty getting out of chair: Reports Difficulty using hands for taps, buttons,  cutlery, and/or writing: Reports  Review of Systems  Constitutional: Positive for fatigue.  HENT: Positive for mouth dryness. Negative for mouth sores and nose dryness.   Eyes: Negative for pain, visual disturbance and dryness.  Respiratory: Positive for shortness of breath. Negative for cough, hemoptysis and difficulty breathing.   Cardiovascular: Negative for chest pain, palpitations and hypertension.  Gastrointestinal: Positive for constipation and diarrhea. Negative for blood in stool.  Endocrine: Positive for cold intolerance, heat intolerance and increased urination.  Genitourinary: Negative for difficulty urinating and painful urination.  Musculoskeletal: Positive for arthralgias, joint pain, joint swelling, muscle weakness, morning stiffness and muscle tenderness. Negative for myalgias and myalgias.  Skin: Positive for rash. Negative for color change, pallor, hair loss, nodules/bumps, skin tightness, ulcers and sensitivity to sunlight.  Allergic/Immunologic: Positive for susceptible to infections.  Neurological: Positive for numbness. Negative for dizziness and headaches.  Hematological: Positive for bruising/bleeding tendency. Negative for swollen glands.  Psychiatric/Behavioral: Positive for sleep disturbance. Negative for depressed mood. The patient is not nervous/anxious.     PMFS History:  Patient Active Problem List   Diagnosis Date Noted  . Hypokalemia 07/07/2019  . Precordial chest pain 05/04/2019  . Educated about COVID-19 virus infection 05/04/2019  . Persistent vomiting   . Abdominal pain, epigastric   . Nausea & vomiting 03/11/2019  . Gastritis   . Nausea without vomiting 02/28/2019  . Class 2 obesity with body mass index (BMI) of 37.0 to 37.9 in adult 11/25/2018  . Mixed connective  tissue disease (HCC) 08/14/2017  . Recurrent genital herpes 05/12/2017  . Shortness of breath 04/15/2017  . Dizziness 02/04/2017  . Situational anxiety 06/08/2016  . Atypical chest  pain 06/08/2016  . Trapezius muscle spasm 04/23/2016  . Fibromyalgia 02/13/2016  . 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 Chronic fatigue 02/13/2016  . Vitamin D deficiency 11/25/2015  . Autoimmune disease (HCC) 11/23/2015  . High risk medication use 11/23/2015  . Colon cancer screening 01/25/2014  . Anterior neck pain 12/01/2013  . Inflammatory arthritis 08/18/2012  . Allergic rhinitis 06/23/2012  . HTN (hypertension) 06/23/2012  . High cholesterol 06/23/2012  . Thyroid nodule - benign 06/23/2012  . Arthralgia 06/23/2012  . Mild intermittent asthma   . Sickle cell trait (HCC)   . History of stomach ulcers   . ESOPHAGEAL STRICTURE 10/04/2007  . GERD 10/04/2007  . 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 Dysphagia, pharyngoesophageal phase 10/04/2007    Past Medical History:  Diagnosis Date  . Allergy   . Anemia   . Asthma   . Blood transfusion without reported diagnosis   . Fibromyalgia   . GERD (gastroesophageal reflux disease)   . Heart murmur   . High cholesterol   . History of stomach ulcers   . Hypertension   . Lupus (HCC)   . MI, old   . Rheumatoid arthritis (HCC)   . Thyroid disease   . Thyroid mass   . Vertigo     Family History  Problem Relation Age of Onset  . Alcohol abuse Father   . Hypertension Father   . Heart disease Father   . Other Mother        tobacco use disorder, refuses to see a doctor  . Osteoporosis Sister   . Colon cancer Maternal Uncle   . Diabetes Maternal Grandmother   . Healthy Son   . Healthy Daughter   . Multiple sclerosis Daughter   . Colon polyps Neg Hx   . Esophageal cancer Neg Hx   . Kidney disease Neg Hx   . Stomach cancer Neg Hx   . Rectal cancer Neg Hx    Past Surgical History:  Procedure Laterality Date  . ABDOMINAL HYSTERECTOMY     done for menorrhagia, ovaries remain  . CERVICAL DISCECTOMY     plates and screws in neck  . CESAREAN SECTION    . COLONOSCOPY    . ESOPHAGEAL MANOMETRY N/A 02/12/2014   Procedure: ESOPHAGEAL  MANOMETRY (EM);  Surgeon: Louis Meckel, MD;  Location: [facility];  Service: Endoscopy;  Laterality: N/A;  . ESOPHAGOGASTRODUODENOSCOPY  03/04/2011   Procedure: ESOPHAGOGASTRODUODENOSCOPY (EGD);  Surgeon: Louis Meckel, MD;  Location: [facility];  Service: Endoscopy;  Laterality: N/A;  BOTOX Injection  . ESOPHAGOGASTRODUODENOSCOPY (EGD) WITH PROPOFOL N/A 03/12/2019   Procedure: ESOPHAGOGASTRODUODENOSCOPY (EGD) WITH PROPOFOL;  Surgeon: Jeani Hawking, MD;  Location: [facility] ENDOSCOPY;  Service: Endoscopy;  Laterality: N/A;  . exc benign breast lump    . TONSILLECTOMY    . 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     Immunization History  Administered Date(s) Administered  . Influenza, Quadrivalent, Recombinant, Inj, Pf 10/11/2017  . Influenza,inj,Quad PF,6+ Mos 11/04/2015, 10/21/2018  . PFIZER SARS-COV-2 Vaccination 03/20/2019, 04/12/2019, 12/07/2019  . Zoster Recombinat (Shingrix) 10/11/2017, 12/25/2017     Objective: Vital Signs: BP 122/81 (BP Location: Left Arm, Patient Position: Sitting, Cuff Size: Normal)   Pulse  65   Resp 15   Ht 5\' 5"  (1.651 m)   Wt 206 lb (93.4 kg)   BMI 34.28 kg/m    Physical Exam Vitals and nursing note reviewed.  Constitutional:      Appearance: She is well-developed.  HENT:     Head: Normocephalic and atraumatic.  Eyes:     Conjunctiva/sclera: Conjunctivae normal.  Pulmonary:     Effort: Pulmonary effort is normal.  Abdominal:     Palpations: Abdomen is soft.  Musculoskeletal:     Cervical back: Normal range of motion.  Skin:    General: Skin is warm and dry.     Capillary Refill: Capillary refill takes less than 2 seconds.  Neurological:     Mental Status: She is alert and oriented to person, place, and time.  Psychiatric:        Behavior: Behavior normal.      Musculoskeletal Exam: C-spine good ROM.  Painful and  limited ROM of right shoulder joint. Tenderness over both shoulders. Elbow joints, wrist joints, MCPs, PIPs, and DIPs good ROM with no synovitis.  PIP and DIP thickening noted.  Significant thickening of the right 3rd PIP joint.  Complete fist formation bilaterally.  Tenderness over bilateral trochanteric bursa.  Knee joints good ROM with no warmth or effusion.  Ankle joints good ROM with no tenderness or inflammation.   CDAI Exam: CDAI Score: -- Patient Global: --; Provider Global: -- Swollen: --; Tender: -- Joint Exam 12/27/2019   No joint exam has been documented for this visit   There is currently no information documented on the homunculus. Go to the Rheumatology activity and complete the homunculus joint exam.  Investigation: No additional findings.  Imaging: No results found.  Recent Labs: Lab Results  Component Value Date   WBC 3.3 (L) 10/16/2019   HGB 12.7 10/16/2019   PLT 275 10/16/2019   NA 140 10/16/2019   K 3.6 10/16/2019   CL 102 10/16/2019   CO2 28 10/16/2019   GLUCOSE 95 10/16/2019   BUN 11 10/16/2019   CREATININE 0.85 10/16/2019   BILITOT 0.7 10/16/2019   ALKPHOS 53 05/27/2019   AST 16 10/16/2019   ALT 9 10/16/2019   PROT 7.0 10/16/2019   ALBUMIN 3.9 05/27/2019   CALCIUM 9.1 10/16/2019   GFRAA 88 10/16/2019   QFTBGOLDPLUS NEGATIVE 07/26/2018    Speciality Comments: No specialty comments available.  Procedures:  Large Joint Inj: bilateral glenohumeral on 12/27/2019 1:53 PM Indications: pain Details: 27 G 1.5 in needle, posterior approach  Arthrogram: No  Medications (Right): 1 mL lidocaine 1 %; 40 mg triamcinolone acetonide 40 MG/ML Aspirate (Right): 0 mL Medications (Left): 1 mL lidocaine 1 %; 40 mg triamcinolone acetonide 40 MG/ML Aspirate (Left): 0 mL Outcome: tolerated well, no immediate complications Procedure, treatment alternatives, risks and benefits explained, specific risks discussed. Consent was given by the patient. Immediately prior  to procedure a time out was called to verify the correct patient, procedure, equipment, support staff and site/side marked as required. Patient was prepped and draped in the usual sterile fashion.     Allergies: Aspirin, Demerol [meperidine], Hydrocodone-acetaminophen, Ibuprofen, Imdur [isosorbide nitrate], Meperidine hcl, Morphine, Oxycodone-acetaminophen, Procaine hcl, Toprol xl [metoprolol], and Tramadol    Assessment / Plan:     Visit Diagnoses: Mixed connective tissue disease (HCC): +ANA,+Sm,+RNP,+RF, arthritis: She has not had any signs or symptoms of a flare recently.  She is clinically doing well on xeljanz 11 mg XR daily, MTX 0.6 ml sq injections once  weekly, folic acid 2 mg daily, and prednisone 2.5 mg daily. She has not missed any doses recently and is tolerating these medications without any side effects.  She presents today with increased pain in both shoulder joints, R>L.  Bilateral glenohumeral joint cortisone injections were performed today.  She continues to experience intermittent pain and stiffness in both hands but no synovitis was noted on exam.  She has not needed to take a prednisone taper recently.  She has noticed an increased frequency of symptoms of raynaud's with cooler weather temperatures but no digital ulcerations, delayed refill, or signs of gangrene were noted on exam. Faint malar rash noted.  She was advised to start wearing a daily facial moisturizer with SPF.  She has not been wearing sunscreen daily but was strongly encouraged to wear sunscreen SPF>30 daily and to avoid direct sun exposure.  She has not had any oral or nasal ulcerations recently. No sicca symptoms.  Lab work from 10/16/19 was reviewed today in the office.  She plans to return for lab work on 01/11/20 so future orders were placed today.  She will continue on the current treatment regimen.  She was advised to notify us if she develops any new or worsening symptoms.    Plan: Urinalysis, Routine w reflex  microscopic, CBC with Differential/Platelet, COMPLETE METABOLIC PANEL WITH GFR, Urinalysis, Routine w reflex microscopic, C3 and C4, Anti-DNA antibody, double-stranded, Sedimentation rate, RNP Antibody  High risk medication use -Xeljanz 11 mg XR by mouth daily, MTX 0.6 ml sq once weekly (reduced dose low WBC count), folic acid 2 mg daily, and prednisone 2.5 mg daily. She is returning on 01/11/20 to update lab work.  Future orders for CBC and CMP were placed today.  She will continue to require update  Plan: CBC with Differential/Platelet, COMPLETE METABOLIC PANEL WITH GFR She has not had any recent infections. She has received all 3 COVID-19 vaccinations. She is aware that she is to hold methotrexate and [name] if she develops signs or symptoms of an infection and to resume once the infection has completely cleared.  Screening for tuberculosis - TB gold negative on 07/26/18.  Future order for TB gold placed today.  She will updating lab work on 01/11/20.  She declined updating lab work today. Plan: QuantiFERON-TB Gold Plus  Chronic pain of both shoulders: X-rays of both shoulders were obtained on 11/10/2018 which were unremarkable.  She had bilateral shoulder joint cortisone injections performed on 01/23/2019 which provided significant relief.  She has been experiencing increased pain in both shoulder joints for the past 3 days.  She has not had any recent injuries or falls. Her pain has progressively been worsening and she has had difficulty with range of motion. She has also been experiencing nocturnal pain.  She requested bilateral shoulder joint cortisone injections today.  She tolerated the procedure well.  Aftercare was discussed.  Procedure note completed above.  She was given a handout of shoulder joint exercises to perform. If her symptoms persist or worsen she was advised to notify us and we will update x-rays and refer her to PT.   Fibromyalgia: She has been experiencing less frequent and severe  fibromyalgia flares since retiring. She has ongoing trapezius muscle tension and muscle tenderness bilaterally as well as trochanter bursitis of both hips. We discussed the importance of performing stretching exercises on a daily basis. She was strongly encouraged to practice good sleep hygiene and exercise daily.  Trochanteric bursitis of both hips: She has tenderness to  palpation over bilateral trochanteric bursa.  She experiences nocturnal pain when laying on her sides at night.  She was given a handout of exercises to perform.   Primary insomnia: She experiences intermittent nocturnal pain.   Chronic fatigue: Chronic and secondary to insomnia.  Discussed the importance of regular exercise.   History of vitamin D deficiency: Vitamin D [id number]-36 on 03/30/17. She is taking a daily vitamin D supplement.   Other medical conditions are listed as follows:   History of stomach ulcers  History of high cholesterol  History of gastroesophageal reflux (GERD)  History of hypertension  History of asthma    Orders: Orders Placed This Encounter  Procedures  . Large Joint Inj  . Urinalysis, Routine w reflex microscopic  . CBC with Differential/Platelet  . COMPLETE METABOLIC PANEL WITH GFR  . Urinalysis, Routine w reflex microscopic  . C3 and C4  . Anti-DNA antibody, double-stranded  . Sedimentation rate  . QuantiFERON-TB Gold Plus  . RNP Antibody   No orders of the defined types were placed in this encounter.    Follow-Up Instructions: Return in about 5 months (around 05/26/2020) for MCTD.   Gearldine Bienenstock, PA-C  Note - This record has been created using Dragon software.  Chart creation errors have been sought, but may not always  have been located. Such creation errors do not reflect on  the standard of medical care.

## 2019-12-27 NOTE — Telephone Encounter (Signed)
Patient scheduled 12/27/19 at 1:20 pm with Ladona Ridgel

## 2019-12-27 NOTE — Telephone Encounter (Signed)
Patient called stating her right shoulder is "extremely painful and swollen."  Patient wants to come in today, 12/27/19 for an injection.  Patient states only Dr. Corliss Skains has done them in the past, but is willing to schedule with Ladona Ridgel.  Please advise.

## 2019-12-27 NOTE — Telephone Encounter (Signed)
Last Visit: 10/16/2019 Next Visit: 01/11/2020 Labs: 10/16/2019 CMP WNL. WBC 3.3, Lymphs 257, Absolute Monocytes 198  Current Dose per office note 10/16/2019: methotrexate 0.6 mL sq injections once weekly DX: Mixed connective tissue disease   Okay to refill MTX?

## 2019-12-29 NOTE — Progress Notes (Deleted)
Office Visit Note  [name]             Date of Birth: [birth year]           MRN: [id number]             PCP: [place], [name], MD Referring: [place], [name], MD Visit Date: 01/11/2020 Occupation: @GUAROCC @  Subjective:  No chief complaint on file.   History of Present Illness: Patricia Reed is a 59 y.o. female ***   Activities of Daily Living:  Patient reports morning stiffness for *** {minute/hour:19697}.   Patient {ACTIONS;DENIES/REPORTS:21021675::"Denies"} nocturnal pain.  Difficulty dressing/grooming: {ACTIONS;DENIES/REPORTS:21021675::"Denies"} Difficulty climbing stairs: {ACTIONS;DENIES/REPORTS:21021675::"Denies"} Difficulty getting out of chair: {ACTIONS;DENIES/REPORTS:21021675::"Denies"} Difficulty using hands for taps, buttons, cutlery, and/or writing: {ACTIONS;DENIES/REPORTS:21021675::"Denies"}  No Rheumatology ROS completed.   PMFS History:  Patient Active Problem List   Diagnosis Date Noted  . Hypokalemia 07/07/2019  . Precordial chest pain 05/04/2019  . Educated about COVID-19 virus infection 05/04/2019  . Persistent vomiting   . Abdominal pain, epigastric   . Nausea & vomiting 03/11/2019  . Gastritis   . Nausea without vomiting 02/28/2019  . Class 2 obesity with body mass index (BMI) of 37.0 to 37.9 in adult 11/25/2018  . Mixed connective tissue disease (HCC) 08/14/2017  . Recurrent genital herpes 05/12/2017  . Shortness of breath 04/15/2017  . Dizziness 02/04/2017  . Situational anxiety 06/08/2016  . Atypical chest pain 06/08/2016  . Trapezius muscle spasm 04/23/2016  . Fibromyalgia 02/13/2016  . 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 Chronic fatigue 02/13/2016  . Vitamin D deficiency 11/25/2015  . Autoimmune disease (HCC) 11/23/2015  . High risk medication use 11/23/2015  . Colon cancer screening 01/25/2014  . Anterior neck pain 12/01/2013  . Inflammatory arthritis 08/18/2012  . Allergic rhinitis 06/23/2012  . HTN (hypertension)  06/23/2012  . High cholesterol 06/23/2012  . Thyroid nodule - benign 06/23/2012  . Arthralgia 06/23/2012  . Mild intermittent asthma   . Sickle cell trait (HCC)   . History of stomach ulcers   . ESOPHAGEAL STRICTURE 10/04/2007  . GERD 10/04/2007  . 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 Dysphagia, pharyngoesophageal phase 10/04/2007    Past Medical History:  Diagnosis Date  . Allergy   . Anemia   . Asthma   . Blood transfusion without reported diagnosis   . Fibromyalgia   . GERD (gastroesophageal reflux disease)   . Heart murmur   . High cholesterol   . History of stomach ulcers   . Hypertension   . Lupus (HCC)   . MI, old   . Rheumatoid arthritis (HCC)   . Thyroid disease   . Thyroid mass   . Vertigo     Family History  Problem Relation Age of Onset  . Alcohol abuse Father   . Hypertension Father   . Heart disease Father   . Other Mother        tobacco use disorder, refuses to see a doctor  . Osteoporosis Sister   . Colon cancer Maternal Uncle   . Diabetes Maternal Grandmother   . Healthy Son   . Healthy Daughter   . Multiple sclerosis Daughter   . Colon polyps Neg Hx   . Esophageal cancer Neg Hx   . Kidney disease Neg Hx   . Stomach cancer Neg Hx   . Rectal cancer Neg Hx    Past Surgical History:  Procedure Laterality Date  . ABDOMINAL HYSTERECTOMY     done for menorrhagia, ovaries remain  .  CERVICAL DISCECTOMY     plates and screws in neck  . CESAREAN SECTION    . COLONOSCOPY    . ESOPHAGEAL MANOMETRY N/A 02/12/2014   Procedure: ESOPHAGEAL MANOMETRY (EM);  Surgeon: Louis Meckel, MD;  Location: [facility];  Service: Endoscopy;  Laterality: N/A;  . ESOPHAGOGASTRODUODENOSCOPY  03/04/2011   Procedure: ESOPHAGOGASTRODUODENOSCOPY (EGD);  Surgeon: Louis Meckel, MD;  Location: [facility];  Service: Endoscopy;  Laterality: N/A;  BOTOX Injection  . ESOPHAGOGASTRODUODENOSCOPY (EGD) WITH PROPOFOL N/A 03/12/2019   Procedure: ESOPHAGOGASTRODUODENOSCOPY (EGD) WITH  PROPOFOL;  Surgeon: Jeani Hawking, MD;  Location: [facility] ENDOSCOPY;  Service: Endoscopy;  Laterality: N/A;  . exc benign breast lump    . TONSILLECTOMY    . 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     Immunization History  Administered Date(s) Administered  . Influenza, Quadrivalent, Recombinant, Inj, Pf 10/11/2017  . Influenza,inj,Quad PF,6+ Mos 11/04/2015, 10/21/2018  . PFIZER SARS-COV-2 Vaccination 03/20/2019, 04/12/2019, 12/07/2019  . Zoster Recombinat (Shingrix) 10/11/2017, 12/25/2017     Objective: Vital Signs: There were no vitals taken for this visit.   Physical Exam   Musculoskeletal Exam: ***  CDAI Exam: CDAI Score: - Patient Global: -; Provider Global: - Swollen: -; Tender: - Joint Exam 01/11/2020   No joint exam has been documented for this visit   There is currently no information documented on the homunculus. Go to the Rheumatology activity and complete the homunculus joint exam.  Investigation: No additional findings.  Imaging: No results found.  Recent Labs: Lab Results  Component Value Date   WBC 3.3 (L) 10/16/2019   HGB 12.7 10/16/2019   PLT 275 10/16/2019   NA 140 10/16/2019   K 3.6 10/16/2019   CL 102 10/16/2019   CO2 28 10/16/2019   GLUCOSE 95 10/16/2019   BUN 11 10/16/2019   CREATININE 0.85 10/16/2019   BILITOT 0.7 10/16/2019   ALKPHOS 53 05/27/2019   AST 16 10/16/2019   ALT 9 10/16/2019   PROT 7.0 10/16/2019   ALBUMIN 3.9 05/27/2019   CALCIUM 9.1 10/16/2019   GFRAA 88 10/16/2019   QFTBGOLDPLUS NEGATIVE 07/26/2018    Speciality Comments: No specialty comments available.  Procedures:  No procedures performed Allergies: Aspirin, Demerol [meperidine], Hydrocodone-acetaminophen, Ibuprofen, Imdur [isosorbide nitrate], Meperidine hcl, Morphine, Oxycodone-acetaminophen, Procaine hcl, Toprol xl [metoprolol], and Tramadol    Assessment / Plan:     Visit Diagnoses: No diagnosis found.  Orders: No orders of the defined types were placed in this encounter.  No orders of the defined types were placed in this encounter.   Face-to-face time spent with patient was *** minutes. Greater than 50% of time was spent in counseling and coordination of care.  Follow-Up Instructions: No follow-ups on file.   Gearldine Bienenstock, PA-C  Note - This record has been created using Dragon software.  Chart creation errors have been sought, but may not always  have been located. Such creation errors do not reflect on  the standard of medical care.,

## 2019-12-31 ENCOUNTER — Other Ambulatory Visit: Payer: Self-pay | Admitting: Family Medicine

## 2020-01-11 ENCOUNTER — Ambulatory Visit: Payer: BC Managed Care – PPO | Admitting: Rheumatology

## 2020-01-11 DIAGNOSIS — Z8711 Personal history of peptic ulcer disease: Secondary | ICD-10-CM

## 2020-01-11 DIAGNOSIS — F5101 Primary insomnia: Secondary | ICD-10-CM

## 2020-01-11 DIAGNOSIS — R5382 Chronic fatigue, unspecified: Secondary | ICD-10-CM

## 2020-01-11 DIAGNOSIS — Z8719 Personal history of other diseases of the digestive system: Secondary | ICD-10-CM

## 2020-01-11 DIAGNOSIS — M7062 Trochanteric bursitis, left hip: Secondary | ICD-10-CM

## 2020-01-11 DIAGNOSIS — G8929 Other chronic pain: Secondary | ICD-10-CM

## 2020-01-11 DIAGNOSIS — M351 Other overlap syndromes: Secondary | ICD-10-CM

## 2020-01-11 DIAGNOSIS — Z8639 Personal history of other endocrine, nutritional and metabolic disease: Secondary | ICD-10-CM

## 2020-01-11 DIAGNOSIS — Z8709 Personal history of other diseases of the respiratory system: Secondary | ICD-10-CM

## 2020-01-11 DIAGNOSIS — Z79899 Other long term (current) drug therapy: Secondary | ICD-10-CM

## 2020-01-11 DIAGNOSIS — M797 Fibromyalgia: Secondary | ICD-10-CM

## 2020-01-11 DIAGNOSIS — Z8679 Personal history of other diseases of the circulatory system: Secondary | ICD-10-CM

## 2020-01-22 ENCOUNTER — Ambulatory Visit (INDEPENDENT_AMBULATORY_CARE_PROVIDER_SITE_OTHER): Payer: BC Managed Care – PPO | Admitting: Physician Assistant

## 2020-01-22 ENCOUNTER — Other Ambulatory Visit: Payer: Self-pay

## 2020-01-22 ENCOUNTER — Encounter: Payer: Self-pay | Admitting: Physician Assistant

## 2020-01-22 VITALS — BP 122/81 | HR 96 | Resp 14 | Ht 65.0 in | Wt 205.0 lb

## 2020-01-22 DIAGNOSIS — Z13820 Encounter for screening for osteoporosis: Secondary | ICD-10-CM

## 2020-01-22 DIAGNOSIS — M797 Fibromyalgia: Secondary | ICD-10-CM | POA: Diagnosis not present

## 2020-01-22 DIAGNOSIS — M7061 Trochanteric bursitis, right hip: Secondary | ICD-10-CM

## 2020-01-22 DIAGNOSIS — M7062 Trochanteric bursitis, left hip: Secondary | ICD-10-CM

## 2020-01-22 DIAGNOSIS — M351 Other overlap syndromes: Secondary | ICD-10-CM

## 2020-01-22 DIAGNOSIS — Z8679 Personal history of other diseases of the circulatory system: Secondary | ICD-10-CM

## 2020-01-22 DIAGNOSIS — Z8719 Personal history of other diseases of the digestive system: Secondary | ICD-10-CM

## 2020-01-22 DIAGNOSIS — Z111 Encounter for screening for respiratory tuberculosis: Secondary | ICD-10-CM

## 2020-01-22 DIAGNOSIS — R5382 Chronic fatigue, unspecified: Secondary | ICD-10-CM

## 2020-01-22 DIAGNOSIS — G8929 Other chronic pain: Secondary | ICD-10-CM

## 2020-01-22 DIAGNOSIS — M25511 Pain in right shoulder: Secondary | ICD-10-CM | POA: Diagnosis not present

## 2020-01-22 DIAGNOSIS — E559 Vitamin D deficiency, unspecified: Secondary | ICD-10-CM

## 2020-01-22 DIAGNOSIS — F5101 Primary insomnia: Secondary | ICD-10-CM

## 2020-01-22 DIAGNOSIS — Z8709 Personal history of other diseases of the respiratory system: Secondary | ICD-10-CM

## 2020-01-22 DIAGNOSIS — Z79899 Other long term (current) drug therapy: Secondary | ICD-10-CM | POA: Diagnosis not present

## 2020-01-22 DIAGNOSIS — B372 Candidiasis of skin and nail: Secondary | ICD-10-CM

## 2020-01-22 DIAGNOSIS — Z8639 Personal history of other endocrine, nutritional and metabolic disease: Secondary | ICD-10-CM

## 2020-01-22 DIAGNOSIS — Z8711 Personal history of peptic ulcer disease: Secondary | ICD-10-CM

## 2020-01-22 DIAGNOSIS — M25512 Pain in left shoulder: Secondary | ICD-10-CM

## 2020-01-22 DIAGNOSIS — Z7952 Long term (current) use of systemic steroids: Secondary | ICD-10-CM

## 2020-01-22 NOTE — Progress Notes (Signed)
Office Visit Note  [name]             Date of Birth: 1960-06-24           MRN: [id number]             PCP: [name], [name], MD Referring: [name], [name], MD Visit Date: 01/22/2020 Occupation: _0 @  Subjective:  Medication monitoring  History of Present Illness: Patricia Reed is a 60 y.o. female with history of mixed connective tissue disease and fibromyalgia.  Patient is on Xeljanz 11 mg XR daily, methotrexate 0.6 mL subcu injections once weekly, and folic acid 2 mg by mouth daily.  She continues to take prednisone 2.5 mg daily.  She denies any recent signs or symptoms of a flare.  She states that both shoulder joint pain has resolved since having cortisone injections performed on 12/27/2019.  She denies any other increased joint pain or joint swelling at this time.  She has noticed increased symptoms of Raynaud's with cooler weather temperatures but denies any digital ulcerations.  She denies any recent facial rashes but was diagnosed with a yeast infection under both breasts by her gynecologist.  She has been using a topical agent which has been helpful but she requesting a refill.  She denies any recent oral or nasal ulcerations.  She denies any increased sicca symptoms.  She denies any chest pain, palpitations, or shortness of breath.  She has ongoing photosensitivity and tries to avoid direct sun exposure for prolonged periods of time.  She has sunscreen which she wears if she knows she will be outside.   Activities of Daily Living:  Patient reports morning stiffness for 4  hours.    Patient Denies nocturnal pain.  Difficulty dressing/grooming: Denies Difficulty climbing stairs: Denies Difficulty getting out of chair: Denies Difficulty using hands for taps, buttons, cutlery, and/or writing: Denies  Review of Systems  Constitutional: Positive for fatigue.  HENT: Positive for mouth dryness. Negative for mouth sores and nose dryness.   Eyes: Negative for  pain, itching and dryness.  Respiratory: Negative for shortness of breath and difficulty breathing.   Cardiovascular: Negative for chest pain and palpitations.  Gastrointestinal: Negative for blood in stool, constipation and diarrhea.  Endocrine: Negative for increased urination.  Genitourinary: Negative for difficulty urinating.  Musculoskeletal: Positive for arthralgias, joint pain, myalgias, morning stiffness, muscle tenderness and myalgias. Negative for joint swelling.  Skin: Positive for color change. Negative for rash and redness.  Allergic/Immunologic: Negative for susceptible to infections.  Neurological: Positive for numbness. Negative for dizziness, headaches, memory loss and weakness.  Hematological: Positive for bruising/bleeding tendency.  Psychiatric/Behavioral: Positive for sleep disturbance. Negative for confusion.    PMFS History:  Patient Active Problem List   Diagnosis Date Noted  . Hypokalemia 07/07/2019  . Precordial chest pain 05/04/2019  . Educated about COVID-19 virus infection 05/04/2019  . Persistent vomiting   . Abdominal pain, epigastric   . Nausea & vomiting 03/11/2019  . Gastritis   . Nausea without vomiting 02/28/2019  . Class 2 obesity with body mass index (BMI) of 37.0 to 37.9 in adult 11/25/2018  . Mixed connective tissue disease ([place]) 08/14/2017  . Recurrent genital herpes 05/12/2017  . Shortness of breath 04/15/2017  . Dizziness 02/04/2017  . Situational anxiety 06/08/2016  . Atypical chest pain 06/08/2016  . Trapezius muscle spasm 04/23/2016  . Fibromyalgia 02/13/2016  . 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 Chronic fatigue 02/13/2016  . Vitamin D deficiency 11/25/2015  .  Autoimmune disease ([place]) 11/23/2015  . High risk medication use 11/23/2015  . Colon cancer screening 01/25/2014  . Anterior neck pain 12/01/2013  . Inflammatory arthritis 08/18/2012  . Allergic rhinitis 06/23/2012  . HTN (hypertension) 06/23/2012  . High cholesterol  06/23/2012  . Thyroid nodule - benign 06/23/2012  . Arthralgia 06/23/2012  . Mild intermittent asthma   . Sickle cell trait ([place])   . History of stomach ulcers   . ESOPHAGEAL STRICTURE 10/04/2007  . GERD 10/04/2007  . 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 Dysphagia, pharyngoesophageal phase 10/04/2007    Past Medical History:  Diagnosis Date  . Allergy   . Anemia   . Asthma   . Blood transfusion without reported diagnosis   . Fibromyalgia   . GERD (gastroesophageal reflux disease)   . Heart murmur   . High cholesterol   . History of stomach ulcers   . Hypertension   . Lupus ([place])   . MI, old   . Rheumatoid arthritis ([place])   . Thyroid disease   . Thyroid mass   . Vertigo     Family History  Problem Relation Age of Onset  . Alcohol abuse Father   . Hypertension Father   . Heart disease Father   . Other Mother        tobacco use disorder, refuses to see a doctor  . Osteoporosis Sister   . Colon cancer Maternal Uncle   . Diabetes Maternal Grandmother   . Healthy Son   . Healthy Daughter   . Multiple sclerosis Daughter   . Colon polyps Neg Hx   . Esophageal cancer Neg Hx   . Kidney disease Neg Hx   . Stomach cancer Neg Hx   . Rectal cancer Neg Hx    Past Surgical History:  Procedure Laterality Date  . ABDOMINAL HYSTERECTOMY     done for menorrhagia, ovaries remain  . CERVICAL DISCECTOMY     plates and screws in neck  . CESAREAN SECTION    . COLONOSCOPY    . ESOPHAGEAL MANOMETRY N/A 02/12/2014   Procedure: ESOPHAGEAL MANOMETRY (EM);  Surgeon: Inda Castle, MD;  Location: [facility];  Service: Endoscopy;  Laterality: N/A;  . ESOPHAGOGASTRODUODENOSCOPY  03/04/2011   Procedure: ESOPHAGOGASTRODUODENOSCOPY (EGD);  Surgeon: Inda Castle, MD;  Location: [facility];  Service: Endoscopy;  Laterality: N/A;  BOTOX Injection  . ESOPHAGOGASTRODUODENOSCOPY (EGD) WITH PROPOFOL N/A 03/12/2019   Procedure: ESOPHAGOGASTRODUODENOSCOPY (EGD) WITH PROPOFOL;  Surgeon: Carol Ada, MD;  Location: [facility];  Service: Endoscopy;  Laterality: N/A;  . exc benign breast lump    . TONSILLECTOMY    . 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     Immunization History  Administered Date(s) Administered  . Influenza Split 11/04/2015, 10/11/2017, 10/21/2018  . Influenza, Quadrivalent, Recombinant, Inj, Pf 10/11/2017  . Influenza,inj,Quad PF,6+ Mos 11/04/2015, 10/21/2018  . PFIZER SARS-COV-2 Vaccination 03/20/2019, 04/12/2019, 12/07/2019  . Pneumococcal Polysaccharide-23 01/09/2020  . Zoster Recombinat (Shingrix) 10/11/2017, 12/25/2017, 01/09/2020     Objective: Vital Signs: BP 122/81 (BP Location: Left Arm, Patient Position: Sitting, Cuff Size: Normal)   Pulse 96   Resp 14   Ht _0  (1.651 m)   Wt 205 lb (93 kg)   BMI 34.11 kg/m    Physical Exam Vitals and nursing note reviewed.  Constitutional:      Appearance: She is well-developed and  well-nourished.  HENT:     Head: Normocephalic and atraumatic.  Eyes:     Extraocular Movements: EOM normal.     Conjunctiva/sclera: Conjunctivae normal.  Cardiovascular:     Pulses: Intact distal pulses.  Pulmonary:     Effort: Pulmonary effort is normal.  Abdominal:     Palpations: Abdomen is soft.  Musculoskeletal:     Cervical back: Normal range of motion.  Skin:    General: Skin is warm and dry.     Capillary Refill: Capillary refill takes less than 2 seconds.  Neurological:     Mental Status: She is alert and oriented to person, place, and time.  Psychiatric:        Mood and Affect: Mood and affect normal.        Behavior: Behavior normal.      Musculoskeletal Exam: C-spine, thoracic spine, lumbar spine have good range of motion with no discomfort.  Shoulder joints, lesions, wrist joints, MCPs, PIPs, DIPs have good range of motion with no synovitis.  She has complete fist formation  bilaterally.  Thickening of the right third PIP joint noted.  Hip joints have good range of motion with no discomfort.  Tenderness over bilateral trochanteric bursa.  Knee joints have good range of motion with no warmth or effusion.  Ankle joints have good range of motion with no tenderness or inflammation.  CDAI Exam: CDAI Score: -- Patient Global: --; Provider Global: -- Swollen: --; Tender: -- Joint Exam 01/22/2020   No joint exam has been documented for this visit   There is currently no information documented on the homunculus. Go to the Rheumatology activity and complete the homunculus joint exam.  Investigation: No additional findings.  Imaging: No results found.  Recent Labs: Lab Results  Component Value Date   WBC 3.3 (L) 10/16/2019   HGB 12.7 10/16/2019   PLT 275 10/16/2019   NA 140 10/16/2019   K 3.6 10/16/2019   CL 102 10/16/2019   CO2 28 10/16/2019   GLUCOSE 95 10/16/2019   BUN 11 10/16/2019   CREATININE 0.85 10/16/2019   BILITOT 0.7 10/16/2019   ALKPHOS 53 05/27/2019   AST 16 10/16/2019   ALT 9 10/16/2019   PROT 7.0 10/16/2019   ALBUMIN 3.9 05/27/2019   CALCIUM 9.1 10/16/2019   GFRAA 88 10/16/2019   QFTBGOLDPLUS NEGATIVE 07/26/2018    Speciality Comments: No specialty comments available.  Procedures:  No procedures performed Allergies: Aspirin, Demerol [meperidine], Hydrocodone-acetaminophen, Ibuprofen, Imdur [isosorbide nitrate], Meperidine hcl, Morphine, Oxycodone-acetaminophen, Procaine hcl, Toprol xl [metoprolol], and Tramadol   Assessment / Plan:     Visit Diagnoses: Mixed connective tissue disease ([place]) - +ANA,+Sm,+RNP,+RF, arthritis: She has not had any signs or symptoms of a flare recently.  She is clinically doing well on Xeljanz 11 mg XR by mouth daily, methotrexate 0.6 mL subcu injections once weekly, folic acid 2 mg by mouth daily, and prednisone 2.5 mg by mouth daily.  She is not experiencing any increased pain.  She has no synovitis on  examination today.  She has not had any recent Maller rashes but has ongoing photosensitivity.  We discussed the importance of avoiding direct sun exposure and wearing sunscreen SPF greater than 50 on a daily basis.  She has been experiencing more frequent symptoms of Raynaud's but no digital stations or signs of gangrene were noted on examination today.  We discussed the importance of taking conservative measures and avoiding symptom triggers.  She has not had any recent oral or  nasal ulcerations.  She has not been experiencing any increased sicca symptoms recently.  She denies any shortness of breath, palpitations, or chest pain.  Lab work from 10/16/2019 was reviewed today in the office: Double-stranded ENA negative, complements within normal limits, ESR within normal limits.  We will repeat the following lab work today.  Orders were released.  She will continue to follow-up at [facility] rheumatology every 6 months.  She was advised to notify us if she develops any new or worsening symptoms.  She will continue on the current treatment regimen.  She will follow-up in the office in 5 months.- Plan: COMPLETE METABOLIC PANEL WITH GFR, CBC with Differential/Platelet, Urinalysis, Routine w reflex microscopic, Anti-DNA antibody, double-stranded, C3 and C4, Sedimentation rate, RNP Antibody  High risk medication use - Xeljanz 11 mg XR by mouth daily, MTX 0.6 ml sq once weekly (reduced dose low WBC count), folic acid 2 mg daily, and prednisone 2.5 mg daily.  CBC and CMP were updated on 10/16/2019.  She is due to update lab work today.  Orders were released.  TB gold negative on 07/26/2018.  Order for TB gold was released today.- Plan: COMPLETE METABOLIC PANEL WITH GFR, CBC with Differential/Platelet, QuantiFERON-TB Gold Plus She has not had any recent infections.  She has received both shingrix vaccines and all 3 Pfizer Covid vaccinations. Discussed the importance of holding xeljanz and MTX if she develops signs or symptoms of  an infection and to resume once the infection has completely cleared.   Screening for tuberculosis -Order for TB gold released today.  TB gold will continue to be checked on a yearly basis. Plan: QuantiFERON-TB Gold Plus  Chronic pain of both shoulders -Resolved.  She has good ROM with no discomfort.  No tenderness to palpation.  She had bilateral cortisone injections performed on 12/27/19, which resolved her discomfort.   Fibromyalgia: She has not had any recent fibromyalgia flares.  She has occasional myalgias and muscle tenderness. She takes baclofen 10 mg at bedtime as needed for muscle spasms.  She has ongoing discomfort due to trochanteric bursitis of both hips.  She was encouraged to perform stretching exercises daily.  Discussed the importance of regular exercise and good sleep hygiene.  Trochanteric bursitis of both hips: She has tenderness to palpation on exam today. She was given a handout of exercises to perform. Several of these exercises were demonstrated in the office.  Chronic fatigue: Chronic but stable.   Primary insomnia: Ongoing   Osteoporosis screening -Patient is postmenopausal is on long term prednisone. Order for DEXA placed today for osteoporosis screening. Plan: DG BONE DENSITY (DXA)  Long term systemic steroid user -She is taking prednisone 2.5 mg daily. Order for DEXA today.  Plan: DG BONE DENSITY (DXA)  Vitamin D deficiency - Vitamin D level will be checked today.  Discussed that ideally her vitamin D should be between 50-60. Plan: VITAMIN D 25 Hydroxy (Vit-D Deficiency, Fractures)  Yeast infection of the skin: Inferior to bilateral breasts.  Treated with topical agent prescribed by gynecologist.  She will require a refill soon according to the patient.   Discussed that she is at higher risk for recurrent yeast infections due to being on Xeljanz and methotrexate.  Discussed the importance of holding Xeljanz and methotrexate until the infection has cleared in the  future.   Other medical conditions are listed as follows:   History of stomach ulcers  History of gastroesophageal reflux (GERD)  History of hypertension  History of asthma  History  of high cholesterol      Orders: Orders Placed This Encounter  Procedures  . DG BONE DENSITY (DXA)  . COMPLETE METABOLIC PANEL WITH GFR  . CBC with Differential/Platelet  . Urinalysis, Routine w reflex microscopic  . Anti-DNA antibody, double-stranded  . C3 and C4  . Sedimentation rate  . RNP Antibody  . QuantiFERON-TB Gold Plus  . VITAMIN D 25 Hydroxy (Vit-D Deficiency, Fractures)   No orders of the defined types were placed in this encounter.    Follow-Up Instructions: Return in about 5 months (around 06/21/2020) for MCTD.   Ofilia Neas, PA-C  Note - This record has been created using Dragon software.  Chart creation errors have been sought, but may not always  have been located. Such creation errors do not reflect on  the standard of medical care.

## 2020-01-22 NOTE — Patient Instructions (Addendum)
Standing Labs We placed an order today for your standing lab work.   Please have your standing labs drawn in April and every 3 months   If possible, please have your labs drawn 2 weeks prior to your appointment so that the provider can discuss your results at your appointment.  We have open lab daily Monday through Thursday from 8:30-12:30 PM and 1:30-4:30 PM and Friday from 8:30-12:30 PM and 1:30-4:00 PM at the office of Dr. Shaili Deveshwar, [facility].   Please be advised, patients with office appointments requiring lab work will take precedents over walk-in lab work.  If possible, please come for your lab work on Monday and Friday afternoons, as you may experience shorter wait times. The office is located at [address], [address], [place], [place] [address] No appointment is necessary.   Labs are drawn by [facility]. Please bring your co-pay at the time of your lab draw.  You may receive a bill from [facility] for your lab work.  If you wish to have your labs drawn at another location, please call the office 24 hours in advance to send orders.  If you have any questions regarding directions or hours of operation,  please call [phone].   As a reminder, please drink plenty of water prior to coming for your lab work. Thanks!    Hip Bursitis Rehab Ask your health care provider which exercises are safe for you. Do exercises exactly as told by your health care provider and adjust them as directed. It is normal to feel mild stretching, pulling, tightness, or discomfort as you do these exercises. Stop right away if you feel sudden pain or your pain gets worse. Do not begin these exercises until told by your health care provider. Stretching exercise This exercise warms up your muscles and joints and improves the movement and flexibility of your hip. This exercise also helps to relieve pain and stiffness. Iliotibial band stretch An iliotibial band is a strong band of muscle tissue  that runs from the outer side of your hip to the outer side of your thigh and knee. 1. Lie on your side with your left / right leg in the top position. 2. Bend your left / right knee and grab your ankle. Stretch out your bottom arm to help you balance. 3. Slowly bring your knee back so your thigh is behind your body. 4. Slowly lower your knee toward the floor until you feel a gentle stretch on the outside of your left / right thigh. If you do not feel a stretch and your knee will not fall farther, place the heel of your other foot on top of your knee and pull your knee down toward the floor with your foot. 5. Hold this position for __________ seconds. 6. Slowly return to the starting position. Repeat __________ times. Complete this exercise __________ times a day. Strengthening exercises These exercises build strength and endurance in your hip and pelvis. Endurance is the ability to use your muscles for a long time, even after they get tired. Bridge This exercise strengthens the muscles that move your thigh backward (hip extensors). 1. Lie on your back on a firm surface with your knees bent and your feet flat on the floor. 2. Tighten your buttocks muscles and lift your buttocks off the floor until your trunk is level with your thighs. ? Do not arch your back. ? You should feel the muscles working in your buttocks and the back of your thighs. If   you do not feel these muscles, slide your feet 1-2 inches (2.5-5 cm) farther away from your buttocks. ? If this exercise is too easy, try doing it with your arms crossed over your chest. 3. Hold this position for __________ seconds. 4. Slowly lower your hips to the starting position. 5. Let your muscles relax completely after each repetition. Repeat __________ times. Complete this exercise __________ times a day. Squats This exercise strengthens the muscles in front of your thigh and knee (quadriceps). 1. Stand in front of a table, with your feet and  knees pointing straight ahead. You may rest your hands on the table for balance but not for support. 2. Slowly bend your knees and lower your hips like you are going to sit in a chair. ? Keep your weight over your heels, not over your toes. ? Keep your lower legs upright so they are parallel with the table legs. ? Do not let your hips go lower than your knees. ? Do not bend lower than told by your health care provider. ? If your hip pain increases, do not bend as low. 3. Hold the squat position for __________ seconds. 4. Slowly push with your legs to return to standing. Do not use your hands to pull yourself to standing. Repeat __________ times. Complete this exercise __________ times a day. Hip hike 1. Stand sideways on a bottom step. Stand on your left / right leg with your other foot unsupported next to the step. You can hold on to the railing or wall for balance if needed. 2. Keep your knees straight and your torso square. Then lift your left / right hip up toward the ceiling. 3. Hold this position for __________ seconds. 4. Slowly let your left / right hip lower toward the floor, past the starting position. Your foot should get closer to the floor. Do not lean or bend your knees. Repeat __________ times. Complete this exercise __________ times a day. Single leg stand 1. Without shoes, stand near a railing or in a doorway. You may hold on to the railing or door frame as needed for balance. 2. Squeeze your left / right buttock muscles, then lift up your other foot. ? Do not let your left / right hip push out to the side. ? It is helpful to stand in front of a mirror for this exercise so you can watch your hip. 3. Hold this position for __________ seconds. Repeat __________ times. Complete this exercise __________ times a day. This information is not intended to replace advice given to you by your health care provider. Make sure you discuss any questions you have with your health care  provider. Document Revised: 05/02/2018 Document Reviewed: 05/02/2018 Elsevier Patient Education  [place].

## 2020-01-24 ENCOUNTER — Other Ambulatory Visit: Payer: Self-pay | Admitting: Physician Assistant

## 2020-01-24 LAB — COMPLETE METABOLIC PANEL WITH GFR
AG Ratio: 1.3 (calc) (ref 1.0–2.5)
ALT: 16 U/L (ref 6–29)
AST: 15 U/L (ref 10–35)
Albumin: 4.1 g/dL (ref 3.6–5.1)
Alkaline phosphatase (APISO): 54 U/L (ref 37–153)
BUN: 16 mg/dL (ref 7–25)
CO2: 32 mmol/L (ref 20–32)
Calcium: 9.4 mg/dL (ref 8.6–10.4)
Chloride: 100 mmol/L (ref 98–110)
Creat: 0.74 mg/dL (ref 0.50–1.05)
GFR, Est African American: 104 mL/min/{1.73_m2} (ref >=60)
GFR, Est Non African American: 90 mL/min/{1.73_m2} (ref >=60)
Globulin: 3.1 g/dL (calc) (ref 1.9–3.7)
Glucose, Bld: 78 mg/dL (ref 65–99)
Potassium: 4 mmol/L (ref 3.5–5.3)
Sodium: 140 mmol/L (ref 135–146)
Total Bilirubin: 0.5 mg/dL (ref 0.2–1.2)
Total Protein: 7.2 g/dL (ref 6.1–8.1)

## 2020-01-24 LAB — CBC WITH DIFFERENTIAL/PLATELET
Absolute Monocytes: 333 cells/uL (ref 200–950)
Basophils Absolute: 41 cells/uL (ref 0–200)
Basophils Relative: 0.9 %
Eosinophils Absolute: 59 cells/uL (ref 15–500)
Eosinophils Relative: 1.3 %
HCT: 38 % (ref 35.0–45.0)
Hemoglobin: 12.4 g/dL (ref 11.7–15.5)
Lymphs Abs: 909 cells/uL (ref 850–3900)
MCH: 29.5 pg (ref 27.0–33.0)
MCHC: 32.6 g/dL (ref 32.0–36.0)
MCV: 90.3 fL (ref 80.0–100.0)
MPV: 10.4 fL (ref 7.5–12.5)
Monocytes Relative: 7.4 %
Neutro Abs: 3159 cells/uL (ref 1500–7800)
Neutrophils Relative %: 70.2 %
Platelets: 326 10*3/uL (ref 140–400)
RBC: 4.21 10*6/uL (ref 3.80–5.10)
RDW: 14.4 % (ref 11.0–15.0)
Total Lymphocyte: 20.2 %
WBC: 4.5 10*3/uL (ref 3.8–10.8)

## 2020-01-24 LAB — URINALYSIS, ROUTINE W REFLEX MICROSCOPIC
Bacteria, UA: NONE SEEN /HPF
Bilirubin Urine: NEGATIVE
Glucose, UA: NEGATIVE
Hgb urine dipstick: NEGATIVE
Hyaline Cast: NONE SEEN /LPF
Nitrite: NEGATIVE
Protein, ur: NEGATIVE
RBC / HPF: NONE SEEN /HPF (ref 0–2)
Specific Gravity, Urine: 1.029 (ref 1.001–1.03)
WBC, UA: NONE SEEN /HPF (ref 0–5)
pH: 5.5 (ref 5.0–8.0)

## 2020-01-24 LAB — SEDIMENTATION RATE: Sed Rate: 17 mm/h (ref 0–30)

## 2020-01-24 LAB — QUANTIFERON-TB GOLD PLUS
Mitogen-NIL: >10 IU/mL
NIL: 0.02 IU/mL
QuantiFERON-TB Gold Plus: NEGATIVE
TB1-NIL: 0 IU/mL
TB2-NIL: 0 IU/mL

## 2020-01-24 LAB — C3 AND C4
C3 Complement: 168 mg/dL (ref 83–193)
C4 Complement: 37 mg/dL (ref 15–57)

## 2020-01-24 LAB — RNP ANTIBODY: Ribonucleic Protein(ENA) Antibody, IgG: >8 AI — AB

## 2020-01-24 LAB — VITAMIN D 25 HYDROXY (VIT D DEFICIENCY, FRACTURES): Vit D, 25-Hydroxy: 44 ng/mL (ref 30–100)

## 2020-01-24 LAB — ANTI-DNA ANTIBODY, DOUBLE-STRANDED: ds DNA Ab: <1 IU/mL

## 2020-01-24 MED ORDER — NYSTATIN-TRIAMCINOLONE 100000-0.1 UNIT/GM-% EX CREA: 1.0000 "application " | TOPICAL_CREAM | Freq: Two times a day (BID) | CUTANEOUS | 0 refills | Status: AC

## 2020-01-24 NOTE — Progress Notes (Signed)
RNP remains positive. dsDNA negative.  Complements and ESR WNL.  CBC and CMP WNL. TB gold negative.  UA revealed trace leukocytes and trace ketones.  Negative for nitrites and bacteria.  Vitamin D is WNL-44. Please advise the patient to continue on a maintenance dose of vitamin D.  Lab work is not consistent with a flare.  No change in therapy at this time.

## 2020-01-24 NOTE — Progress Notes (Unsigned)
ny

## 2020-02-07 NOTE — Telephone Encounter (Signed)
Please check on status of DEXA at [facility].  Order is in place.  Please notify the patient that the order has been placed.  Please provide phone number for her to call to schedule the appointment.

## 2020-02-13 ENCOUNTER — Telehealth: Payer: Self-pay | Admitting: *Deleted

## 2020-02-13 NOTE — Telephone Encounter (Signed)
Received DEXA results from [facility]  Date of Scan: 02/12/2020 Lowest T-score and site measured: -0.3 left femoral neck Significant changes in BMD and site measured (5% and above): N/A  Current Regimen: none  Recommendation:Repeat DEXA in 5 years. Reviewed by Sherron Ales, PA

## 2020-02-29 ENCOUNTER — Telehealth: Payer: Self-pay

## 2020-02-29 DIAGNOSIS — B009 Herpesviral infection, unspecified: Secondary | ICD-10-CM

## 2020-02-29 NOTE — Telephone Encounter (Signed)
Patient called stating she has been experiencing a herpes outbreak that she has been unable to control taking Valacyclovir medication.  Patient states in the past she would take the medication for 3-7 days which would stop the outbreak and not have to take the medication again for another 6 months.  Patient states she is only able to control the outbreak while she is taking the medication, the minute she stops the herpes returns.  Patient is worried it is related to her RA and requested a return call.

## 2020-02-29 NOTE — Telephone Encounter (Signed)
Please advise. Patient last seen in office 01/22/2020.

## 2020-02-29 NOTE — Telephone Encounter (Signed)
Discussed with Dr. Corliss Skains.   Please advise the patient to hold xeljanz and MTX until the outbreak has cleared.   Please refer to ID.  She may have to remain on prophylactic treatment if she is having recurrent flares.    Please also clarify if she has had the shingrix vaccine.

## 2020-02-29 NOTE — Telephone Encounter (Signed)
Patient has had the shingrix vaccine. Referral for ID placed. Patient verbalized understanding.

## 2020-03-01 ENCOUNTER — Encounter: Payer: Self-pay | Admitting: Infectious Diseases

## 2020-03-01 ENCOUNTER — Ambulatory Visit (INDEPENDENT_AMBULATORY_CARE_PROVIDER_SITE_OTHER): Payer: BC Managed Care – PPO | Admitting: Infectious Diseases

## 2020-03-01 ENCOUNTER — Other Ambulatory Visit: Payer: Self-pay

## 2020-03-01 VITALS — BP 129/95 | HR 91 | Temp 97.8°F | Ht 65.0 in | Wt 199.0 lb

## 2020-03-01 DIAGNOSIS — Z1159 Encounter for screening for other viral diseases: Secondary | ICD-10-CM

## 2020-03-01 DIAGNOSIS — A601 Herpesviral infection of perianal skin and rectum: Secondary | ICD-10-CM | POA: Diagnosis not present

## 2020-03-01 DIAGNOSIS — Z114 Encounter for screening for human immunodeficiency virus [HIV]: Secondary | ICD-10-CM

## 2020-03-01 NOTE — Progress Notes (Signed)
[facility]                                                             [address], [place], [place], [address]                                                                     Phn. [phone]; Fax: [phone]                                                                             Date: 03/01/20  Reason for Referral: herpes recurrent  Requesting  Provider: Betty Swaziland   Assessment Recurrent Herpes of the Genitalia RA/SLE on immunosuppressive therapy  HIV in 03/11/19 NR   Plan Continue Valacyclovir 500mg  PO BID as a suppressive regimen Will check HIV and HCV antibody for screening purpose Fu with Rheumatology for management of RA, SLE and Fibromyalgia She has adequate pills of Valacyclovir.So, I did not prescribe it Fu in 3 months /as needed   All questions and concerns were discussed and addressed. Patient verbalized understanding of the plan. ____________________________________________________________________________________________________________________  HPI: Patricia Reed is a 60 year old Female with a PMH of fibromyalgia, rheumatoid arthritis and SLE on treatment with methotrexate, prednisone and Xelijan, HTN, HLD, Asthma, GERD who is referred by her rheumatologist in the setting of recurrent herpes of her genitalia.  Patient says she has a long history of herpes of her genital area since 2016.  She was told that by her primary care and OB.  She says she has at least one episode of herpes every 9-month since then and that was cleared after taking 3-day course of Valtrex.  However, she started having recurrent episodes of herpes of her genitalia since December 2021 to the extent that she would need to take Valtrex every week.  She will take Valtrex for 3 days and the lesions would clear for next 2 days but again come right back.  She most recently had a recurrent episode last Monday  around her perianal region.  She started taking Valtrex on Thursday and states  the lesions are gone now.  She states her methotrexate and xelijanz are on hold by her rheumatologist with a plan to start in 1-2 weeks after lesions are resolved  She denies any oral lesions, ulcers, difficulty or pain with swallowing.  She feels burning sensation in her nose and bilateral nasolabial folds.  Denies having rashes in other parts of the body.  Denies any urinary complaints.  She complains of bilateral shoulder pain and recently got a steroid injection for that  She lives with her husband, daughter and granddaughter.  Denies smoking alcohol and using any illicit drugs.  She has a dog at home.  No recent travel.  She does not have any family history of immunocompromising conditions.  ROS: Constitutional: Negative for fever, chills, activity change, appetite change, fatigue and unexpected weight change.  HENT: Negative for congestion, sore throat, rhinorrhea, sneezing, trouble swallowing and sinus pressure.  Eyes: Negative for photophobia and visual disturbance.  Respiratory: Negative for cough, chest tightness, shortness of breath, wheezing and stridor.  Cardiovascular: Negative for chest pain, palpitations and leg swelling.  Gastrointestinal: Negative for nausea, vomiting, abdominal pain, diarrhea, constipation, blood in stool, abdominal distention and anal bleeding.  Genitourinary: Negative for dysuria, hematuria, flank pain and difficulty urinating.  Musculoskeletal: Negative for myalgias, back pain, ARTHRALGIA + Skin: Negative for color change, pallor, rash and wound.  Neurological: Negative for dizziness, tremors, weakness and light-headedness.  Hematological: Negative for adenopathy. Does not bruise/bleed easily.  Psychiatric/Behavioral: Negative for behavioral problems, confusion, sleep disturbance, dysphoric mood, decreased concentration and agitation.   Past Medical History:  Diagnosis Date   . Allergy   . Anemia   . Asthma   . Blood transfusion without reported diagnosis   . Fibromyalgia   . GERD (gastroesophageal reflux disease)   . Heart murmur   . High cholesterol   . History of stomach ulcers   . Hypertension   . Lupus (HCC)   . MI, old   . Rheumatoid arthritis (HCC)   . Thyroid disease   . Thyroid mass   . Vertigo    Past Surgical History:  Procedure Laterality Date  . ABDOMINAL HYSTERECTOMY     done for menorrhagia, ovaries remain  . CERVICAL DISCECTOMY     plates and screws in neck  . CESAREAN SECTION    . COLONOSCOPY    . ESOPHAGEAL MANOMETRY N/A 02/12/2014   Procedure: ESOPHAGEAL MANOMETRY (EM);  Surgeon: Louis Meckel, MD;  Location: [facility];  Service: Endoscopy;  Laterality: N/A;  . ESOPHAGOGASTRODUODENOSCOPY  03/04/2011   Procedure: ESOPHAGOGASTRODUODENOSCOPY (EGD);  Surgeon: Louis Meckel, MD;  Location: [facility];  Service: Endoscopy;  Laterality: N/A;  BOTOX Injection  . ESOPHAGOGASTRODUODENOSCOPY (EGD) WITH PROPOFOL N/A 03/12/2019   Procedure: ESOPHAGOGASTRODUODENOSCOPY (EGD) WITH PROPOFOL;  Surgeon: Jeani Hawking, MD;  Location: [facility] ENDOSCOPY;  Service: Endoscopy;  Laterality: N/A;  . exc benign breast lump    . TONSILLECTOMY    . UPPER GASTROINTESTINAL ENDOSCOPY     Current Outpatient Medications on File Prior to Visit  Medication Sig Dispense Refill  . albuterol (PROVENTIL) (2.5 MG/3ML) 0.083% nebulizer solution Take 3 mLs (2.5 mg total) by nebulization every 6 (six) hours as needed for wheezing or shortness of breath. 75 mL 12  . Ascorbic Acid (VITAMIN C PO) Take by mouth.    . B-D INS SYR ULTRAFINE 1CC/30G 30G X 1/2" 1 ML MISC     . baclofen (LIORESAL) 10 MG tablet TAKE 1 TABLET BY MOUTH AT BEDTIME AS NEEDED FOR MUSCLE SPASMS 90 tablet 1  . Cholecalciferol 25 MCG (1000 UT) capsule Take by mouth.    . diclofenac sodium (VOLTAREN) 1 % GEL Apply 3 g to 3 large joints up to 3 times daily. (Patient taking differently: Apply 2 g  topically daily as needed (pain).) 3 Tube 3  . Estradiol 10 MCG TABS vaginal tablet Yuvafem 10 mcg vaginal tablet    . famotidine (PEPCID) 20 MG tablet TAKE 1 TO 2 TABLETS BY MOUTH AT BEDTIME 180 tablet 1  . fluticasone furoate-vilanterol (BREO ELLIPTA) 100-25 MCG/INH AEPB Inhale 1 puff into the lungs daily. 28 each 0  . folic acid (FOLVITE) 1 MG  tablet TAKE 2 TABLETS (2 MG TOTAL) BY MOUTH DAILY. (Patient taking differently: Take 1 mg by mouth 2 (two) times daily.) 180 tablet 3  . hydrochlorothiazide (HYDRODIURIL) 25 MG tablet TAKE 1 TABLET BY MOUTH EVERY DAY 90 tablet 1  . KLOR-CON M20 20 MEQ tablet TAKE 1 TABLET BY MOUTH EVERY DAY 90 tablet 1  . Methotrexate Sodium (METHOTREXATE, PF,) 50 MG/2ML injection INJECT 0.6 MLS (15 MG TOTAL) INTO THE SKIN ONCE A WEEK. 8 mL 2  . nystatin-triamcinolone (MYCOLOG II) cream Apply 1 application topically 2 (two) times daily. 60 g 0  . Omega-3 Fatty Acids (FISH OIL) 1000 MG CAPS Take by mouth.    . pantoprazole (PROTONIX) 40 MG tablet TAKE 1 TABLET BY MOUTH TWICE A DAY 60 tablet 0  . predniSONE (DELTASONE) 5 MG tablet Take 2.5 mg by mouth daily.     . Tuberculin-Allergy Syringes (ALLERGY SYRINGE 1CC/27GX1/2") 27G X 1/2" 1 ML MISC Patient to use to inject MTX weekly 12 each 3  . valACYclovir (VALTREX) 500 MG tablet TAKE 1 TABLET (500 MG TOTAL) BY MOUTH 2 (TWO) TIMES DAILY. FOR 3 DAYS WITH ACUTE EPISODES. 18 tablet 1  . vitamin B-12 (CYANOCOBALAMIN) 100 MCG tablet Take 100 mcg by mouth daily.    Marland Kitchen VITAMIN D, ERGOCALCIFEROL, PO Take 1 capsule by mouth daily.    [name] XR 11 MG TB24 Take 1 tablet by mouth daily.    . Zinc Citrate-Phytase (ZYTAZE) 25-500 MG CAPS Take by mouth.    Marland Kitchen acetaminophen (TYLENOL) 650 MG CR tablet Take 650 mg by mouth every 8 (eight) hours as needed for pain.  (Patient not taking: Reported on 03/01/2020)    . losartan (COZAAR) 25 MG tablet Take 1 tablet (25 mg total) by mouth daily. (Patient not taking: Reported on 03/01/2020) 90 tablet 1    No current facility-administered medications on file prior to visit.   Allergies  Allergen Reactions  . Aspirin Other (See Comments)    GI bleed  . Demerol [Meperidine]   . Hydrocodone-Acetaminophen Hives and Nausea And Vomiting  . Ibuprofen Other (See Comments)    GI bleed  . Imdur [Isosorbide Nitrate] Other (See Comments)    Reaction unknown  . Isosorbide Dinitrate     Other reaction(s): Other (see comments) Other reaction(s): Other (See Comments) Reaction unknown Reaction unknown  . Meperidine Hcl Hives and Nausea And Vomiting    Short term memory loss  . Morphine Hives and Nausea And Vomiting  . Oxycodone-Acetaminophen Hives and Nausea And Vomiting    Reaction unknown  . Procaine Hcl Other (See Comments)    Ineffective  . Toprol Xl [Metoprolol] Other (See Comments)    Reaction unknown  . Tramadol Itching  . Codeine Nausea Only    Other reaction(s): Other (see comments)   Social History   Socioeconomic History  . Marital status: Married    Spouse name: Not on file  . Number of children: 4  . Years of education: Not on file  . Highest education level: Not on file  Occupational History  . Occupation: Lobbyist: [employer]  Tobacco Use  . Smoking status: Never Smoker  . Smokeless tobacco: Never Used  Vaping Use  . Vaping Use: Never used  Substance and Sexual Activity  . Alcohol use: Not Currently    Alcohol/week: 0.0 standard drinks    Comment: occ  . Drug use: No  . Sexual activity: Yes    Birth control/protection: None,  Surgical    Comment: LAVH  Other Topics Concern  . 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     Social Determinants of Health   Financial Resource Strain: Not on file  Food Insecurity: Not on file  Transportation Needs: Not on file  Physical Activity: Not on file  Stress: Not on file  Social Connections: Not on file   Intimate Partner Violence: Not on file     Vitals BP (!) 129/95   Pulse 91   Temp 97.8 F (36.6 C) (Oral)   Ht  (1.651 m)   Wt 199 lb (90.3 kg)   SpO2 97%   BMI 33.12 kg/m    Examination  General - not in acute distress, comfortably sitting in chair HEENT - PEERLA, no pallor and no icterus, NO ORAL ULCERS/SORES  Chest - b/l clear air entry, no additional sounds CVS- Normal s1s2, RRR Abdomen - Soft, Non tender , non distended Ext- no pedal edema Neuro: grossly normal GU:  Examined in the presence of RN - no lesions in and around the genitalia and perianal skin  Back - WNL Psych : calm and cooperative  Current Outpatient Medications on File Prior to Visit  Medication Sig Dispense Refill  . albuterol (PROVENTIL) (2.5 MG/3ML) 0.083% nebulizer solution Take 3 mLs (2.5 mg total) by nebulization every 6 (six) hours as needed for wheezing or shortness of breath. 75 mL 12  . Ascorbic Acid (VITAMIN C PO) Take by mouth.    . B-D INS SYR ULTRAFINE 1CC/30G 30G X 1/2" 1 ML MISC     . baclofen (LIORESAL) 10 MG tablet TAKE 1 TABLET BY MOUTH AT BEDTIME AS NEEDED FOR MUSCLE SPASMS 90 tablet 1  . diclofenac sodium (VOLTAREN) 1 % GEL Apply 3 g to 3 large joints up to 3 times daily. (Patient taking differently: Apply 2 g topically daily as needed (pain).) 3 Tube 3  . Estradiol 10 MCG TABS vaginal tablet Yuvafem 10 mcg vaginal tablet    . famotidine (PEPCID) 20 MG tablet TAKE 1 TO 2 TABLETS BY MOUTH AT BEDTIME 180 tablet 1  . fluticasone furoate-vilanterol (BREO ELLIPTA) 100-25 MCG/INH AEPB Inhale 1 puff into the lungs daily. 28 each 0  . folic acid (FOLVITE) 1 MG tablet TAKE 2 TABLETS (2 MG TOTAL) BY MOUTH DAILY. (Patient taking differently: Take 1 mg by mouth 2 (two) times daily.) 180 tablet 3  . hydrochlorothiazide (HYDRODIURIL) 25 MG tablet TAKE 1 TABLET BY MOUTH EVERY DAY 90 tablet 1  . KLOR-CON M20 20 MEQ tablet TAKE 1 TABLET BY MOUTH EVERY DAY 90 tablet 1  . Methotrexate Sodium  (METHOTREXATE, PF,) 50 MG/2ML injection INJECT 0.6 MLS (15 MG TOTAL) INTO THE SKIN ONCE A WEEK. 8 mL 2  . nystatin-triamcinolone (MYCOLOG II) cream Apply 1 application topically 2 (two) times daily. 60 g 0  . Omega-3 Fatty Acids (FISH OIL) 1000 MG CAPS Take by mouth.    . pantoprazole (PROTONIX) 40 MG tablet TAKE 1 TABLET BY MOUTH TWICE A DAY 60 tablet 0  . predniSONE (DELTASONE) 5 MG tablet Take 2.5 mg by mouth daily.     . Tuberculin-Allergy Syringes (ALLERGY SYRINGE 1CC/27GX1/2") 27G X 1/2" 1 ML MISC Patient to use to inject MTX weekly 12 each 3  . valACYclovir (VALTREX) 500 MG tablet TAKE 1 TABLET (500 MG TOTAL) BY MOUTH 2 (TWO) TIMES DAILY. FOR 3 DAYS WITH ACUTE EPISODES.  18 tablet 1  . vitamin B-12 (CYANOCOBALAMIN) 100 MCG tablet Take 100 mcg by mouth daily.    Marland Kitchen VITAMIN D, ERGOCALCIFEROL, PO Take 1 capsule by mouth daily.    [name] XR 11 MG TB24 Take 1 tablet by mouth daily.    . Zinc Citrate-Phytase (ZYTAZE) 25-500 MG CAPS Take by mouth.    Marland Kitchen acetaminophen (TYLENOL) 650 MG CR tablet Take 650 mg by mouth every 8 (eight) hours as needed for pain.  (Patient not taking: Reported on 03/01/2020)    . Cholecalciferol 25 MCG (1000 UT) capsule Take by mouth.    . losartan (COZAAR) 25 MG tablet Take 1 tablet (25 mg total) by mouth daily. (Patient not taking: Reported on 03/01/2020) 90 tablet 1   No current facility-administered medications on file prior to visit.   Allergies  Allergen Reactions  . Aspirin Other (See Comments)    GI bleed  . Demerol [Meperidine]   . Hydrocodone-Acetaminophen Hives and Nausea And Vomiting  . Ibuprofen Other (See Comments)    GI bleed  . Imdur [Isosorbide Nitrate] Other (See Comments)    Reaction unknown  . Isosorbide Dinitrate     Other reaction(s): Other (see comments) Other reaction(s): Other (See Comments) Reaction unknown Reaction unknown  . Meperidine Hcl Hives and Nausea And Vomiting    Short term memory loss  . Morphine Hives and Nausea And  Vomiting  . Oxycodone-Acetaminophen Hives and Nausea And Vomiting    Reaction unknown  . Procaine Hcl Other (See Comments)    Ineffective  . Toprol Xl [Metoprolol] Other (See Comments)    Reaction unknown  . Tramadol Itching  . Codeine Nausea Only    Other reaction(s): Other (see comments)   Social History   Socioeconomic History  . Marital status: Married    Spouse name: Not on file  . Number of children: 4  . Years of education: Not on file  . Highest education level: Not on file  Occupational History  . Occupation: Lobbyist: [employer]  Tobacco Use  . Smoking status: Never Smoker  . Smokeless tobacco: Never Used  Vaping Use  . Vaping Use: Never used  Substance and Sexual Activity  . Alcohol use: Yes    Alcohol/week: 0.0 standard drinks    Comment: occ  . Drug use: No  . Sexual activity: Yes    Birth control/protection: None, Surgical    Comment: LAVH  Other Topics Concern  . 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     Social Determinants of Health   Financial Resource Strain: Not on file  Food Insecurity: Not on file  Transportation Needs: Not on file  Physical Activity: Not on file  Stress: Not on file  Social Connections: Not on file  Intimate Partner Violence: Not on file    Recent labs CBC Latest Ref Rng & Units 01/22/2020 10/16/2019 09/27/2019  WBC 3.8 - 10.8 Thousand/uL 4.5 3.3(L) 3.6(L)  Hemoglobin 11.7 - 15.5 g/dL 96.0 45.4 09.8  Hematocrit 35.0 - 45.0 % 38.0 39.5 36.4  Platelets 140 - 400 Thousand/uL 326 275 271   CMP Latest Ref Rng & Units 01/22/2020 10/16/2019 09/27/2019  Glucose 65 - 99 mg/dL 78 95 119(J)  BUN 7 - 25 mg/dL Creatinine 0.50 - 1.05 mg/dL 4.78 2.95 6.21  Sodium 135 - 146 mmol/L 140 140 138  Potassium 3.5 -  5.3 mmol/L 4.0 3.6 3.5  Chloride 98 - 110 mmol/L 100 102 100  CO2 20 - 32 mmol/L 32 28 28  Calcium 8.6 - 10.4  mg/dL 9.4 9.1 9.5  Total Protein 6.1 - 8.1 g/dL 7.2 7.0 7.3  Total Bilirubin 0.2 - 1.2 mg/dL 0.5 0.7 0.5  Alkaline Phos 38 - 126 U/L - - -  AST 10 - 35 U/L ALT 6 - 29 U/L Pertinent Microbiology Results for orders placed or performed during the hospital encounter of 06/17/19  SARS CORONAVIRUS 2 (TAT 6-24 HRS) Nasopharyngeal Nasopharyngeal Swab     Status: None   Collection Time: 06/17/19  1:50 PM   Specimen: Nasopharyngeal Swab  Result Value Ref Range Status   SARS Coronavirus 2 NEGATIVE NEGATIVE Final    Comment: (NOTE) SARS-CoV-2 target nucleic acids are NOT DETECTED. The SARS-CoV-2 RNA is generally detectable in upper and lower respiratory specimens during the acute phase of infection. Negative results do not preclude SARS-CoV-2 infection, do not rule out co-infections with other pathogens, and should not be used as the sole basis for treatment or other patient management decisions. Negative results must be combined with clinical observations, patient history, and epidemiological information. The expected result is Negative. Fact Sheet for Patients: HairSlick.no Fact Sheet for Healthcare Providers: quierodirigir.com This test is not yet approved or cleared by the [place] FDA and  has been authorized for detection and/or diagnosis of SARS-CoV-2 by FDA under an Emergency Use Authorization (EUA). This EUA will remain  in effect (meaning this test can be used) for the duration of the COVID-19 declaration under Section 56 4(b)(1) of the Act, 21 U.S.C. section 360bbb-3(b)(1), unless the authorization is terminated or revoked sooner. Performed at [facility], [address] [place], [place] [address]       Pertinent Imaging All pertinent labs/Imagings/notes reviewed. All pertinent plain films and CT images have been personally visualized and interpreted; radiology reports have been  reviewed. Decision making incorporated into the Impression / Recommendations.  I spent 30 minutes with the patient including  review of prior medical records with greater than 50% of time in face to face counsel of the patient.    Electronically signed by:  Odette Fraction, MD Infectious Disease Physician [facility] [address] [place], [place] [address] Phone: [phone]  Fax: [phone]

## 2020-03-04 LAB — HIV ANTIBODY (ROUTINE TESTING W REFLEX): HIV 1&2 Ab, 4th Generation: NONREACTIVE

## 2020-03-04 LAB — HEPATITIS C ANTIBODY
Hepatitis C Ab: NONREACTIVE
SIGNAL TO CUT-OFF: 0.01 (ref <1.00)

## 2020-03-30 ENCOUNTER — Other Ambulatory Visit: Payer: Self-pay | Admitting: Physician Assistant

## 2020-04-01 NOTE — Telephone Encounter (Signed)
Next Visit: 06/24/2020  Last Visit: 01/22/2020  Last Fill: 12/27/2019  DX: Mixed connective tissue disease   Current Dose per office note 01/22/2020, MTX 0.6 ml sq once weekly (reduced dose low WBC count  Labs: 01/22/2020, RNP remains positive. dsDNA negative. Complements and ESR WNL. CBC and CMP WNL. TB gold negative.  UA revealed trace leukocytes and trace ketones. Negative for nitrites and bacteria.  Vitamin D is WNL-44. Please advise the patient to continue on a maintenance dose of vitamin D.  Lab work is not consistent with a flare. No change in therapy at this time.   Okay to refill MTX?

## 2020-04-09 ENCOUNTER — Other Ambulatory Visit: Payer: Self-pay | Admitting: *Deleted

## 2020-04-09 DIAGNOSIS — M351 Other overlap syndromes: Secondary | ICD-10-CM

## 2020-04-09 DIAGNOSIS — Z79899 Other long term (current) drug therapy: Secondary | ICD-10-CM

## 2020-04-15 ENCOUNTER — Other Ambulatory Visit: Payer: Self-pay | Admitting: *Deleted

## 2020-04-15 DIAGNOSIS — M351 Other overlap syndromes: Secondary | ICD-10-CM

## 2020-04-15 DIAGNOSIS — Z79899 Other long term (current) drug therapy: Secondary | ICD-10-CM

## 2020-04-16 LAB — CBC WITH DIFFERENTIAL/PLATELET
Absolute Monocytes: 301 cells/uL (ref 200–950)
Basophils Absolute: 59 cells/uL (ref 0–200)
Basophils Relative: 1 %
Eosinophils Absolute: 30 cells/uL (ref 15–500)
Eosinophils Relative: 0.5 %
HCT: 37.6 % (ref 35.0–45.0)
Hemoglobin: 12.1 g/dL (ref 11.7–15.5)
Lymphs Abs: 625 cells/uL — ABNORMAL LOW (ref 850–3900)
MCH: 29.2 pg (ref 27.0–33.0)
MCHC: 32.2 g/dL (ref 32.0–36.0)
MCV: 90.6 fL (ref 80.0–100.0)
MPV: 11.2 fL (ref 7.5–12.5)
Monocytes Relative: 5.1 %
Neutro Abs: 4885 cells/uL (ref 1500–7800)
Neutrophils Relative %: 82.8 %
Platelets: 286 10*3/uL (ref 140–400)
RBC: 4.15 10*6/uL (ref 3.80–5.10)
RDW: 14.1 % (ref 11.0–15.0)
Total Lymphocyte: 10.6 %
WBC: 5.9 10*3/uL (ref 3.8–10.8)

## 2020-04-16 LAB — COMPLETE METABOLIC PANEL WITH GFR
AG Ratio: 1.3 (calc) (ref 1.0–2.5)
ALT: 12 U/L (ref 6–29)
AST: 15 U/L (ref 10–35)
Albumin: 4.1 g/dL (ref 3.6–5.1)
Alkaline phosphatase (APISO): 57 U/L (ref 37–153)
BUN: 16 mg/dL (ref 7–25)
CO2: 33 mmol/L — ABNORMAL HIGH (ref 20–32)
Calcium: 9.5 mg/dL (ref 8.6–10.4)
Chloride: 100 mmol/L (ref 98–110)
Creat: 0.83 mg/dL (ref 0.50–1.05)
GFR, Est African American: 91 mL/min/{1.73_m2} (ref >=60)
GFR, Est Non African American: 78 mL/min/{1.73_m2} (ref >=60)
Globulin: 3.1 g/dL (calc) (ref 1.9–3.7)
Glucose, Bld: 91 mg/dL (ref 65–99)
Potassium: 3.9 mmol/L (ref 3.5–5.3)
Sodium: 142 mmol/L (ref 135–146)
Total Bilirubin: 0.5 mg/dL (ref 0.2–1.2)
Total Protein: 7.2 g/dL (ref 6.1–8.1)

## 2020-04-16 NOTE — Progress Notes (Signed)
Liver and kidney functions are normal.  CBC showed low lymphocyte count.  We will continue to monitor.

## 2020-04-29 NOTE — Progress Notes (Signed)
Office Visit Note  [name]             Date of Birth: [birth year]           MRN: [id number]             PCP: [name], [name], MD Referring: [name], [name], MD Visit Date: 04/30/2020 Occupation: @GUAROCC @  Subjective:  Left shoulder pain   History of Present Illness: Patricia Reed is a 60 y.o. female with mixed connective tissue disease. She is taking xeljanz 11 mg XR by mouth daily, methotrexate 6 tablets by mouth once weekly, folic acid 2 mg by mouth daily, and prednisone 2.5 mg daily. She has not missed any doses recently.   She denies any recent infections.  She presents today with discomfort in both shoulder joints, right greater than left.  She has been taking Tylenol arthritis and applying Voltaren gel topically as needed for pain relief.  She is having limited range of motion with discomfort in the right shoulder and would like a cortisone injection performed today.  She denies any other increased joint pain or joint swelling at this time.  She experiences intermittent myalgias and muscle tenderness due to fibromyalgia.  She had a severe fibromyalgia flare about 1 year ago but overall her discomfort has been manageable.  She has ongoing discomfort due to trochanter bursitis of both hips.  She is trying to perform stretching exercises on a daily basis. She requested a handicap placard since some days she has significant difficulty walking prolonged distances to get into the grocery store.   Patient reports that she established care with Dr. West Bali, ID, and her dose of valtrex was increased to twice daily. She has not had any outbreaks since increasing the daily dose of valtrex.  She denies any other infections recently.     Activities of Daily Living:  Patient reports morning stiffness for  0 minutes.   Patient Reports nocturnal pain.  Difficulty dressing/grooming: Reports Difficulty climbing stairs: Denies Difficulty getting out of chair: Denies Difficulty  using hands for taps, buttons, cutlery, and/or writing: Denies  Review of Systems  Constitutional: Negative for fatigue.  HENT: Negative for mouth sores, mouth dryness and nose dryness.   Eyes: Negative for pain, itching and dryness.  Respiratory: Negative for shortness of breath and difficulty breathing.   Cardiovascular: Negative for chest pain and palpitations.  Gastrointestinal: Positive for constipation. Negative for blood in stool and diarrhea.  Endocrine: Negative for increased urination.  Genitourinary: Negative for difficulty urinating.  Musculoskeletal: Positive for arthralgias and joint pain. Negative for joint swelling, myalgias, morning stiffness, muscle tenderness and myalgias.  Skin: Positive for rash. Negative for color change and redness.  Allergic/Immunologic: Negative for susceptible to infections.  Neurological: Positive for numbness. Negative for dizziness, headaches, memory loss and weakness.  Hematological: Positive for bruising/bleeding tendency.  Psychiatric/Behavioral: Negative for confusion.    PMFS History:  Patient Active Problem List   Diagnosis Date Noted  . Hypokalemia 07/07/2019  . Precordial chest pain 05/04/2019  . Educated about COVID-19 virus infection 05/04/2019  . Persistent vomiting   . Abdominal pain, epigastric   . Nausea & vomiting 03/11/2019  . Gastritis   . Nausea without vomiting 02/28/2019  . Class 2 obesity with body mass index (BMI) of 37.0 to 37.9 in adult 11/25/2018  . Mixed connective tissue disease ([place]) 08/14/2017  . Recurrent genital herpes 05/12/2017  . Shortness of breath 04/15/2017  . Dizziness 02/04/2017  . Situational  anxiety 06/08/2016  . Atypical chest pain 06/08/2016  . Trapezius muscle spasm 04/23/2016  . Fibromyalgia 02/13/2016  . 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 Chronic fatigue 02/13/2016  . Vitamin D deficiency 11/25/2015  . Autoimmune disease ([place]) 11/23/2015  . High risk medication use 11/23/2015  . Colon  cancer screening 01/25/2014  . Anterior neck pain 12/01/2013  . Inflammatory arthritis 08/18/2012  . Allergic rhinitis 06/23/2012  . HTN (hypertension) 06/23/2012  . High cholesterol 06/23/2012  . Thyroid nodule - benign 06/23/2012  . Arthralgia 06/23/2012  . Mild intermittent asthma   . Sickle cell trait ([place])   . History of stomach ulcers   . ESOPHAGEAL STRICTURE 10/04/2007  . GERD 10/04/2007  . 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 Dysphagia, pharyngoesophageal phase 10/04/2007    Past Medical History:  Diagnosis Date  . Allergy   . Anemia   . Asthma   . Blood transfusion without reported diagnosis   . Fibromyalgia   . GERD (gastroesophageal reflux disease)   . Heart murmur   . High cholesterol   . History of stomach ulcers   . Hypertension   . Lupus ([place])   . MI, old   . Rheumatoid arthritis ([place])   . Thyroid disease   . Thyroid mass   . Vertigo     Family History  Problem Relation Age of Onset  . Alcohol abuse Father   . Hypertension Father   . Heart disease Father   . Other Mother        tobacco use disorder, refuses to see a doctor  . Osteoporosis Sister   . Colon cancer Maternal Uncle   . Diabetes Maternal Grandmother   . Healthy Son   . Healthy Daughter   . Multiple sclerosis Daughter   . Colon polyps Neg Hx   . Esophageal cancer Neg Hx   . Kidney disease Neg Hx   . Stomach cancer Neg Hx   . Rectal cancer Neg Hx    Past Surgical History:  Procedure Laterality Date  . ABDOMINAL HYSTERECTOMY     done for menorrhagia, ovaries remain  . CERVICAL DISCECTOMY     plates and screws in neck  . CESAREAN SECTION    . COLONOSCOPY    . ESOPHAGEAL MANOMETRY N/A 02/12/2014   Procedure: ESOPHAGEAL MANOMETRY (EM);  Surgeon: Inda Castle, MD;  Location: [facility];  Service: Endoscopy;  Laterality: N/A;  . ESOPHAGOGASTRODUODENOSCOPY  03/04/2011   Procedure: ESOPHAGOGASTRODUODENOSCOPY (EGD);  Surgeon: Inda Castle, MD;  Location: [facility];  Service:  Endoscopy;  Laterality: N/A;  BOTOX Injection  . ESOPHAGOGASTRODUODENOSCOPY (EGD) WITH PROPOFOL N/A 03/12/2019   Procedure: ESOPHAGOGASTRODUODENOSCOPY (EGD) WITH PROPOFOL;  Surgeon: Carol Ada, MD;  Location: [facility];  Service: Endoscopy;  Laterality: N/A;  . exc benign breast lump    . TONSILLECTOMY    . 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     Immunization History  Administered Date(s) Administered  . Influenza Split 11/04/2015, 10/11/2017, 10/21/2018  . Influenza, Quadrivalent, Recombinant, Inj, Pf 10/11/2017  . Influenza,inj,Quad PF,6+ Mos 11/04/2015, 10/21/2018  . PFIZER(Purple Top)SARS-COV-2 Vaccination 03/20/2019, 04/12/2019, 12/07/2019  . Pneumococcal Polysaccharide-23 01/09/2020  . Zoster Recombinat (Shingrix) 10/11/2017, 12/25/2017, 01/09/2020     Objective: Vital Signs: BP (!) 128/92 (BP Location: Left Arm, Patient Position: Sitting, Cuff Size: Normal)   Pulse 80   Resp 15   Ht 5'  5" (1.651 m)   Wt 204 lb (92.5 kg)   BMI 33.95 kg/m    Physical Exam Vitals and nursing note reviewed.  Constitutional:      Appearance: She is well-developed.  HENT:     Head: Normocephalic and atraumatic.  Eyes:     Conjunctiva/sclera: Conjunctivae normal.  Pulmonary:     Effort: Pulmonary effort is normal.  Abdominal:     Palpations: Abdomen is soft.  Musculoskeletal:     Cervical back: Normal range of motion.  Skin:    General: Skin is warm and dry.     Capillary Refill: Capillary refill takes less than 2 seconds.  Neurological:     Mental Status: She is alert and oriented to person, place, and time.  Psychiatric:        Behavior: Behavior normal.      Musculoskeletal Exam: C-spine limited ROM.  Trapezius muscle tension and muscle tenderness bilaterally.  Painful and limited range of motion of both shoulder joints especially the right shoulder  with abduction and internal rotation.  Elbow joints, wrist joints, MCPs, PIPs, DIPs have good range of motion with no synovitis.  PIP and DIP prominence noted bilaterally.  Hip joints have painful limited range of motion.  She has tenderness palpation over bilateral trochanteric bursae.  Knee joints have good range of motion with no warmth or effusion.  Ankle joints have good range of motion with some tenderness over the left ankle joint but no warmth or effusion was noted.  No tenderness over MTP joints.  CDAI Exam: CDAI Score: -- Patient Global: --; Provider Global: -- Swollen: --; Tender: -- Joint Exam 04/30/2020   No joint exam has been documented for this visit   There is currently no information documented on the homunculus. Go to the Rheumatology activity and complete the homunculus joint exam.  Investigation: No additional findings.  Imaging: No results found.  Recent Labs: Lab Results  Component Value Date   WBC 5.9 04/15/2020   HGB 12.1 04/15/2020   PLT 286 04/15/2020   NA 142 04/15/2020   K 3.9 04/15/2020   CL 100 04/15/2020   CO2 33 (H) 04/15/2020   GLUCOSE 91 04/15/2020   BUN 16 04/15/2020   CREATININE 0.83 04/15/2020   BILITOT 0.5 04/15/2020   ALKPHOS 53 05/27/2019   AST 15 04/15/2020   ALT 12 04/15/2020   PROT 7.2 04/15/2020   ALBUMIN 3.9 05/27/2019   CALCIUM 9.5 04/15/2020   GFRAA 91 04/15/2020   QFTBGOLDPLUS NEGATIVE 01/22/2020    Speciality Comments: No specialty comments available.  Procedures:  Large Joint Inj: R glenohumeral on 04/30/2020 9:23 AM Indications: pain Details: 27 G 1.5 in needle, posterior approach  Arthrogram: No  Medications: 1.5 mL lidocaine 1 %; 40 mg triamcinolone acetonide 40 MG/ML Aspirate: 0 mL Outcome: tolerated well, no immediate complications Procedure, treatment alternatives, risks and benefits explained, specific risks discussed. Consent was given by the patient. Immediately prior to procedure a time out was called to  verify the correct patient, procedure, equipment, support staff and site/side marked as required. Patient was prepped and draped in the usual sterile fashion.     Allergies: Aspirin, Demerol [meperidine], Hydrocodone-acetaminophen, Ibuprofen, Imdur [isosorbide nitrate], Isosorbide dinitrate, Meperidine hcl, Morphine, Oxycodone-acetaminophen, Procaine hcl, Toprol xl [metoprolol], Tramadol, and Codeine    Assessment / Plan:     Visit Diagnoses: Mixed connective tissue disease ([place]) - +ANA,+Sm,+RNP,+RF, arthritis: She has not had any signs or symptoms of a systemic flare.  Overall she has  clinically been doing well on the current treatment regimen including Xeljanz 11 mg XR by mouth daily, methotrexate 0.6 mL sq injections once weekly, folic acid 2 mg by mouth daily, and prednisone 2.5 mg daily.  She has not missed any doses of these medications recently.  Lab work from 01/22/20 was reviewed today in the office: RNP remains positive, ESR WNL, complements WNL, dsDNA is negative, CBC and CMP WNL. She presents today with discomfort in both shoulder joints, right greater than left. X-rays of the right shoulder were obtained today and the right shoulder was injected with cortisone. She has intermittent pain and stiffness in both hands and both feet but had no tenderness or synovitis on exam today.  X-rays of both hands and feet were obtained today to assess for radiographic progression.  She has not had any recent rashes, sicca symptoms, oral or nasal ulcers, raynaud's, increased fatigue, pleuritic chest pain, or SOB.    She will continue on the current treatment regimen.  She was advised to notify us if she develops any signs or symptoms of a flare.  She will follow-up in the office in 3 months and we will update lab work at that time.  - Plan: XR Hand 2 View Right, XR Hand 2 View Left, XR Foot 2 Views Right, XR Foot 2 Views Left  High risk medication use - Xeljanz 11 mg XR by mouth daily, MTX 0.6 ml sq once  weekly (reduced dose low WBC count), folic acid 2 mg daily, and prednisone 2.5 mg daily. CBC and CMP updated on 04/15/20. Her next lab work will be due in June and every 3 months.  Standing orders for CBC and CMP are in place. TB gold negative on 01/22/20 and will continue to be monitored yearly.   She has not had any recent infections.  We discussed the importance of holding Xeljanz and methotrexate if she develops signs or symptoms of an infection and to resume once the infection has completely cleared. She has had 3 pfizer covid-19 vaccine doses.  She was strongly encouraged to received the 2nd booster vaccine.  She was advised to hold MTX and xeljanz for 1 week after receiving the vaccine.   Chronic pain of both shoulders: She continues to have chronic pain in both shoulder joints, right>left.  She has painful ROM of both shoulders with limited abduction and internal rotation of the right shoulder. The discomfort in her right shoulder has progressively been worsening since February.  She has not had any injury and has not been performing any overuse activities recently.  She has been experiencing increased nocturnal pain when lying on her right side at night.  X-rays of the right shoulder were obtained today.  She requested a right shoulder joint cortisone injection.  She tolerated procedure well.  Procedure note was completed above. She ws given a handout of shoulder joint exercises to perform.   Chronic right shoulder pain - She presents today with increased pain in the right shoulder joint which has progressively been worsening since February 2022.  She has not had any injury prior to the onset of symptoms.  X-rays of the right shoulder were obtained today.  She requested a right shoulder joint cortisone injection.  She tolerated procedure well.  Procedure note completed above.  Aftercare was discussed.  She was advised to notify us if her discomfort persists or worsens.  Plan: XR Shoulder Right  Pain  in both hands - Plan: XR Hand 2 View Right, XR  Hand 2 View Left  Pain in both feet - She was given a temporary handicap placard. Plan: XR Foot 2 Views Right, XR Foot 2 Views Left  Fibromyalgia: She has generalized hyperalgesia and positive tender points.  She continues to experience intermittent myalgias and muscle tenderness due to fibromyalgia.  At times it feels as though her muscles are burning.  Her last severe flare of fibromyalgia was over 1 year ago.  She continues to take baclofen 10 mg at bedtime as needed for muscle spasms and insomnia.  We discussed the importance of regular exercise and good sleep hygiene.  Trochanteric bursitis of both hips: She has tenderness to palpation over bilateral trochanteric bursa.  She declined a cortisone injection at this time. She was encouraged to perform stretching exercises on a daily basis.  Primary insomnia: She takes baclofen 10 mg at bedtime for muscle spasms and insomnia.   Chronic fatigue: Stable. Discussed the importance of regular exercise.   Osteoporosis screening: DEXA updated on 02/12/20 T-sore -0.3. No falls or fractures.   Long term systemic steroid user - She is taking prednisone 2.5 mg daily. She is aware of the long term risks of prednisone use.  Yeast infection of the skin - Resolved. Inferior to bilateral breasts.  Treated with topical agent prescribed by gynecologist.    Other medical conditions are listed as follows:   History of gastroesophageal reflux (GERD)  History of vitamin D deficiency: She is taking a vitamin D supplement on a daily basis.  History of stomach ulcers  History of asthma  History of hypertension: BP was 129/92 today.   History of high cholesterol    Orders: Orders Placed This Encounter  Procedures  . Large Joint Inj  . XR Hand 2 View Right  . XR Hand 2 View Left  . XR Foot 2 Views Right  . XR Foot 2 Views Left  . XR Shoulder Right   No orders of the defined types were placed in this  encounter.    Follow-Up Instructions: Return in about 3 months (around 07/30/2020) for Mixed connective tissue disease.   Ofilia Neas, PA-C  Note - This record has been created using Dragon software.  Chart creation errors have been sought, but may not always  have been located. Such creation errors do not reflect on  the standard of medical care.

## 2020-04-30 ENCOUNTER — Ambulatory Visit: Payer: Self-pay

## 2020-04-30 ENCOUNTER — Other Ambulatory Visit: Payer: Self-pay

## 2020-04-30 ENCOUNTER — Encounter: Payer: Self-pay | Admitting: Physician Assistant

## 2020-04-30 ENCOUNTER — Ambulatory Visit (INDEPENDENT_AMBULATORY_CARE_PROVIDER_SITE_OTHER): Payer: BC Managed Care – PPO | Admitting: Physician Assistant

## 2020-04-30 VITALS — BP 128/92 | HR 80 | Resp 15 | Ht 65.0 in | Wt 204.0 lb

## 2020-04-30 DIAGNOSIS — Z7952 Long term (current) use of systemic steroids: Secondary | ICD-10-CM

## 2020-04-30 DIAGNOSIS — M79671 Pain in right foot: Secondary | ICD-10-CM

## 2020-04-30 DIAGNOSIS — Z8709 Personal history of other diseases of the respiratory system: Secondary | ICD-10-CM

## 2020-04-30 DIAGNOSIS — Z13820 Encounter for screening for osteoporosis: Secondary | ICD-10-CM

## 2020-04-30 DIAGNOSIS — M797 Fibromyalgia: Secondary | ICD-10-CM

## 2020-04-30 DIAGNOSIS — M351 Other overlap syndromes: Secondary | ICD-10-CM | POA: Diagnosis not present

## 2020-04-30 DIAGNOSIS — F5101 Primary insomnia: Secondary | ICD-10-CM

## 2020-04-30 DIAGNOSIS — M7062 Trochanteric bursitis, left hip: Secondary | ICD-10-CM

## 2020-04-30 DIAGNOSIS — M79672 Pain in left foot: Secondary | ICD-10-CM | POA: Diagnosis not present

## 2020-04-30 DIAGNOSIS — Z8639 Personal history of other endocrine, nutritional and metabolic disease: Secondary | ICD-10-CM

## 2020-04-30 DIAGNOSIS — Z8711 Personal history of peptic ulcer disease: Secondary | ICD-10-CM

## 2020-04-30 DIAGNOSIS — G8929 Other chronic pain: Secondary | ICD-10-CM | POA: Diagnosis not present

## 2020-04-30 DIAGNOSIS — M79641 Pain in right hand: Secondary | ICD-10-CM | POA: Diagnosis not present

## 2020-04-30 DIAGNOSIS — M25511 Pain in right shoulder: Secondary | ICD-10-CM | POA: Diagnosis not present

## 2020-04-30 DIAGNOSIS — M25512 Pain in left shoulder: Secondary | ICD-10-CM

## 2020-04-30 DIAGNOSIS — M79642 Pain in left hand: Secondary | ICD-10-CM | POA: Diagnosis not present

## 2020-04-30 DIAGNOSIS — Z79899 Other long term (current) drug therapy: Secondary | ICD-10-CM | POA: Diagnosis not present

## 2020-04-30 DIAGNOSIS — Z8679 Personal history of other diseases of the circulatory system: Secondary | ICD-10-CM

## 2020-04-30 DIAGNOSIS — M7061 Trochanteric bursitis, right hip: Secondary | ICD-10-CM

## 2020-04-30 DIAGNOSIS — B372 Candidiasis of skin and nail: Secondary | ICD-10-CM

## 2020-04-30 DIAGNOSIS — Z8719 Personal history of other diseases of the digestive system: Secondary | ICD-10-CM

## 2020-04-30 DIAGNOSIS — R5382 Chronic fatigue, unspecified: Secondary | ICD-10-CM

## 2020-04-30 MED ORDER — TRIAMCINOLONE ACETONIDE 40 MG/ML IJ SUSP
40.0000 mg | INTRAMUSCULAR | Status: AC | PRN
  Administered 2020-04-30: 40 mg via INTRA_ARTICULAR

## 2020-04-30 MED ORDER — LIDOCAINE HCL 1 % IJ SOLN
1.5000 mL | INTRAMUSCULAR | Status: AC | PRN
  Administered 2020-04-30: 1.5 mL

## 2020-04-30 NOTE — Patient Instructions (Addendum)
Standing Labs We placed an order today for your standing lab work.   Please have your standing labs drawn in June and every 3 months   If possible, please have your labs drawn 2 weeks prior to your appointment so that the provider can discuss your results at your appointment.  We have open lab daily Monday through Thursday from 1:30-4:30 PM and Friday from 1:30-4:00 PM at the office of Dr. Pollyann Savoy, [facility].   Please be advised, all patients with office appointments requiring lab work will take precedents over walk-in lab work.  If possible, please come for your lab work on Monday and Friday afternoons, as you may experience shorter wait times. The office is located at [address], [place], [place] [address] No appointment is necessary.   Labs are drawn by [facility]. Please bring your co-pay at the time of your lab draw.  You may receive a bill from [facility] for your lab work.  If you wish to have your labs drawn at another location, please call the office 24 hours in advance to send orders.  If you have any questions regarding directions or hours of operation,  please call [phone].   As a reminder, please drink plenty of water prior to coming for your lab work. Thanks!  Shoulder Exercises Ask your health care provider which exercises are safe for you. Do exercises exactly as told by your health care provider and adjust them as directed. It is normal to feel mild stretching, pulling, tightness, or discomfort as you do these exercises. Stop right away if you feel sudden pain or your pain gets worse. Do not begin these exercises until told by your health care provider. Stretching exercises External rotation and abduction This exercise is sometimes called corner stretch. This exercise rotates your arm outward (external rotation) and moves your arm out from your body (abduction). 1. Stand in a doorway with one of your feet slightly in front of the other.  This is called a staggered stance. If you cannot reach your forearms to the door frame, stand facing a corner of a room. 2. Choose one of the following positions as told by your health care provider: ? Place your hands and forearms on the door frame above your head. ? Place your hands and forearms on the door frame at the height of your head. ? Place your hands on the door frame at the height of your elbows. 3. Slowly move your weight onto your front foot until you feel a stretch across your chest and in the front of your shoulders. Keep your head and chest upright and keep your abdominal muscles tight. 4. Hold for __________ seconds. 5. To release the stretch, shift your weight to your back foot. Repeat __________ times. Complete this exercise __________ times a day.   Extension, standing 1. Stand and hold a broomstick, a cane, or a similar object behind your back. ? Your hands should be a little wider than shoulder width apart. ? Your palms should face away from your back. 2. Keeping your elbows straight and your shoulder muscles relaxed, move the stick away from your body until you feel a stretch in your shoulders (extension). ? Avoid shrugging your shoulders while you move the stick. Keep your shoulder blades tucked down toward the middle of your back. 3. Hold for __________ seconds. 4. Slowly return to the starting position. Repeat __________ times. Complete this exercise __________ times a day. Range-of-motion exercises Pendulum 1. Stand near  a wall or a surface that you can hold onto for balance. 2. Bend at the waist and let your left / right arm hang straight down. Use your other arm to support you. Keep your back straight and do not lock your knees. 3. Relax your left / right arm and shoulder muscles, and move your hips and your trunk so your left / right arm swings freely. Your arm should swing because of the motion of your body, not because you are using your arm or shoulder  muscles. 4. Keep moving your hips and trunk so your arm swings in the following directions, as told by your health care provider: ? Side to side. ? Forward and backward. ? In clockwise and counterclockwise circles. 5. Continue each motion for __________ seconds, or for as long as told by your health care provider. 6. Slowly return to the starting position. Repeat __________ times. Complete this exercise __________ times a day.   Shoulder flexion, standing 1. Stand and hold a broomstick, a cane, or a similar object. Place your hands a little more than shoulder width apart on the object. Your left / right hand should be palm up, and your other hand should be palm down. 2. Keep your elbow straight and your shoulder muscles relaxed. Push the stick up with your healthy arm to raise your left / right arm in front of your body, and then over your head until you feel a stretch in your shoulder (flexion). ? Avoid shrugging your shoulder while you raise your arm. Keep your shoulder blade tucked down toward the middle of your back. 3. Hold for __________ seconds. 4. Slowly return to the starting position. Repeat __________ times. Complete this exercise __________ times a day.   Shoulder abduction, standing 1. Stand and hold a broomstick, a cane, or a similar object. Place your hands a little more than shoulder width apart on the object. Your left / right hand should be palm up, and your other hand should be palm down. 2. Keep your elbow straight and your shoulder muscles relaxed. Push the object across your body toward your left / right side. Raise your left / right arm to the side of your body (abduction) until you feel a stretch in your shoulder. ? Do not raise your arm above shoulder height unless your health care provider tells you to do that. ? If directed, raise your arm over your head. ? Avoid shrugging your shoulder while you raise your arm. Keep your shoulder blade tucked down toward the middle of  your back. 3. Hold for __________ seconds. 4. Slowly return to the starting position. Repeat __________ times. Complete this exercise __________ times a day. Internal rotation 1. Place your left / right hand behind your back, palm up. 2. Use your other hand to dangle an exercise band, a towel, or a similar object over your shoulder. Grasp the band with your left / right hand so you are holding on to both ends. 3. Gently pull up on the band until you feel a stretch in the front of your left / right shoulder. The movement of your arm toward the center of your body is called internal rotation. ? Avoid shrugging your shoulder while you raise your arm. Keep your shoulder blade tucked down toward the middle of your back. 4. Hold for __________ seconds. 5. Release the stretch by letting go of the band and lowering your hands. Repeat __________ times. Complete this exercise __________ times a day.   Strengthening  exercises External rotation 1. Sit in a stable chair without armrests. 2. Secure an exercise band to a stable object at elbow height on your left / right side. 3. Place a soft object, such as a folded towel or a small pillow, between your left / right upper arm and your body to move your elbow about 4 inches (10 cm) away from your side. 4. Hold the end of the exercise band so it is tight and there is no slack. 5. Keeping your elbow pressed against the soft object, slowly move your forearm out, away from your abdomen (external rotation). Keep your body steady so only your forearm moves. 6. Hold for __________ seconds. 7. Slowly return to the starting position. Repeat __________ times. Complete this exercise __________ times a day.   Shoulder abduction 1. Sit in a stable chair without armrests, or stand up. 2. Hold a __________ weight in your left / right hand, or hold an exercise band with both hands. 3. Start with your arms straight down and your left / right palm facing in, toward your  body. 4. Slowly lift your left / right hand out to your side (abduction). Do not lift your hand above shoulder height unless your health care provider tells you that this is safe. ? Keep your arms straight. ? Avoid shrugging your shoulder while you do this movement. Keep your shoulder blade tucked down toward the middle of your back. 5. Hold for __________ seconds. 6. Slowly lower your arm, and return to the starting position. Repeat __________ times. Complete this exercise __________ times a day.   Shoulder extension 1. Sit in a stable chair without armrests, or stand up. 2. Secure an exercise band to a stable object in front of you so it is at shoulder height. 3. Hold one end of the exercise band in each hand. Your palms should face each other. 4. Straighten your elbows and lift your hands up to shoulder height. 5. Step back, away from the secured end of the exercise band, until the band is tight and there is no slack. 6. Squeeze your shoulder blades together as you pull your hands down to the sides of your thighs (extension). Stop when your hands are straight down by your sides. Do not let your hands go behind your body. 7. Hold for __________ seconds. 8. Slowly return to the starting position. Repeat __________ times. Complete this exercise __________ times a day. Shoulder row 1. Sit in a stable chair without armrests, or stand up. 2. Secure an exercise band to a stable object in front of you so it is at waist height. 3. Hold one end of the exercise band in each hand. Position your palms so that your thumbs are facing the ceiling (neutral position). 4. Bend each of your elbows to a 90-degree angle (right angle) and keep your upper arms at your sides. 5. Step back until the band is tight and there is no slack. 6. Slowly pull your elbows back behind you. 7. Hold for __________ seconds. 8. Slowly return to the starting position. Repeat __________ times. Complete this exercise __________  times a day. Shoulder press-ups 1. Sit in a stable chair that has armrests. Sit upright, with your feet flat on the floor. 2. Put your hands on the armrests so your elbows are bent and your fingers are pointing forward. Your hands should be about even with the sides of your body. 3. Push down on the armrests and use your arms to lift yourself  off the chair. Straighten your elbows and lift yourself up as much as you comfortably can. ? Move your shoulder blades down, and avoid letting your shoulders move up toward your ears. ? Keep your feet on the ground. As you get stronger, your feet should support less of your body weight as you lift yourself up. 4. Hold for __________ seconds. 5. Slowly lower yourself back into the chair. Repeat __________ times. Complete this exercise __________ times a day.   Wall push-ups 1. Stand so you are facing a stable wall. Your feet should be about one arm-length away from the wall. 2. Lean forward and place your palms on the wall at shoulder height. 3. Keep your feet flat on the floor as you bend your elbows and lean forward toward the wall. 4. Hold for __________ seconds. 5. Straighten your elbows to push yourself back to the starting position. Repeat __________ times. Complete this exercise __________ times a day.   This information is not intended to replace advice given to you by your health care provider. Make sure you discuss any questions you have with your health care provider. Document Revised: 04/29/2018 Document Reviewed: 02/04/2018 Elsevier Patient Education  2021 [employer].

## 2020-05-06 NOTE — Progress Notes (Signed)
Please notify the patient of x-ray results.    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  No erosive changes.  X-rays of both feet consistent with osteoarthritis.

## 2020-05-11 ENCOUNTER — Telehealth: Payer: Self-pay | Admitting: Physician Assistant

## 2020-05-11 ENCOUNTER — Other Ambulatory Visit: Payer: Self-pay | Admitting: Physician Assistant

## 2020-05-11 MED ORDER — PREDNISONE 5 MG PO TABS: ORAL_TABLET | ORAL | 0 refills | Status: DC

## 2020-05-11 NOTE — Telephone Encounter (Signed)
I received a message from the on-call service regarding Patricia Reed having a flare following the 2nd covid-19 vaccine booster, which she received yesterday.  She is currently having generalized myalgias and feels that her skin is burning due to an underlying fibromyalgia flare.  She is also experiencing increased pain and stiffness in all of her joints.  She has noticed some swelling in her hands and wrist joints.  She also woke up this morning with a headache.  She is in severe pain, and she is concerned about having to hold her medications as previously advised.    A prednisone taper starting at 20 mg tapering by 5 mg every 2 days was sent to the pharmacy.  She was advised to follow up at urgent care or the emergency department if her symptoms persist or worsen.    Sherron Ales, PA-C

## 2020-05-14 ENCOUNTER — Telehealth: Payer: Self-pay | Admitting: *Deleted

## 2020-05-14 NOTE — Telephone Encounter (Signed)
[facility]  Therapeutic Alert - Osteoporosis prevention in individuals  using longer term glucocorticoids   I called patient, patient is taking Vitamin D, patient is not taking Calcium, patient advised to take calcium daily, patient verbalized understanding.

## 2020-06-10 NOTE — Progress Notes (Signed)
Office Visit Note  [name]             Date of Birth: 01/06/1961           MRN: [id number]             PCP: [place], [name], MD Referring: [place], [name], MD Visit Date: 06/24/2020 Occupation: @GUAROCC @  Subjective:  Other (Patient reports flare- onset last Wednesday approximately. )   History of Present Illness: Patricia Reed is a 60 y.o. female with a history of mixed connective tissue disease and osteoarthritis.  She states she is having a flare with increased pain and discomfort in her joints.  She describes discomfort in her shoulders, elbows, hands, knee joints, ankles and her feet.  She is also having a flare of fibromyalgia with generalized pain and hyperalgesia.  She states she has been quite busy with her work and experiences fatigue towards the end of the day.  Activities of Daily Living:  Patient reports morning stiffness for all day.  Patient Reports nocturnal pain.  Difficulty dressing/grooming: Reports Difficulty climbing stairs: Denies Difficulty getting out of chair: Denies Difficulty using hands for taps, buttons, cutlery, and/or writing: Reports  Review of Systems  Constitutional: Positive for fatigue. Negative for night sweats, weight gain and weight loss.  HENT: Positive for mouth dryness. Negative for mouth sores, trouble swallowing, trouble swallowing and nose dryness.   Eyes: Negative for pain, redness, itching, visual disturbance and dryness.  Respiratory: Negative for cough, shortness of breath and difficulty breathing.   Cardiovascular: Negative for chest pain, palpitations, hypertension, irregular heartbeat and swelling in legs/feet.  Gastrointestinal: Negative for blood in stool, constipation and diarrhea.  Endocrine: Negative for increased urination.  Genitourinary: Negative for difficulty urinating and vaginal dryness.  Musculoskeletal: Positive for arthralgias, joint pain, myalgias, morning stiffness, muscle tenderness and  myalgias. Negative for joint swelling and muscle weakness.  Skin: Positive for color change. Negative for rash, hair loss, redness, skin tightness, ulcers and sensitivity to sunlight.  Allergic/Immunologic: Negative for susceptible to infections.  Neurological: Positive for numbness. Negative for dizziness, headaches, memory loss, night sweats and weakness.  Hematological: Positive for bruising/bleeding tendency. Negative for swollen glands.  Psychiatric/Behavioral: Positive for depressed mood and sleep disturbance. Negative for confusion. The patient is not nervous/anxious.     PMFS History:  Patient Active Problem List   Diagnosis Date Noted  . Hypokalemia 07/07/2019  . Precordial chest pain 05/04/2019  . Educated about COVID-19 virus infection 05/04/2019  . Persistent vomiting   . Abdominal pain, epigastric   . Nausea & vomiting 03/11/2019  . Gastritis   . Nausea without vomiting 02/28/2019  . Class 2 obesity with body mass index (BMI) of 37.0 to 37.9 in adult 11/25/2018  . Mixed connective tissue disease (HCC) 08/14/2017  . Recurrent genital herpes 05/12/2017  . Shortness of breath 04/15/2017  . Dizziness 02/04/2017  . Situational anxiety 06/08/2016  . Atypical chest pain 06/08/2016  . Trapezius muscle spasm 04/23/2016  . Fibromyalgia 02/13/2016  . 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 Chronic fatigue 02/13/2016  . Vitamin D deficiency 11/25/2015  . Autoimmune disease (HCC) 11/23/2015  . High risk medication use 11/23/2015  . Colon cancer screening 01/25/2014  . Anterior neck pain 12/01/2013  . Inflammatory arthritis 08/18/2012  . Allergic rhinitis 06/23/2012  . HTN (hypertension) 06/23/2012  . High cholesterol 06/23/2012  . Thyroid nodule - benign 06/23/2012  . Arthralgia 06/23/2012  . Mild intermittent asthma   . Sickle cell  trait (HCC)   . History of stomach ulcers   . ESOPHAGEAL STRICTURE 10/04/2007  . GERD 10/04/2007  . 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 Dysphagia,  pharyngoesophageal phase 10/04/2007    Past Medical History:  Diagnosis Date  . Allergy   . Anemia   . Asthma   . Blood transfusion without reported diagnosis   . Fibromyalgia   . GERD (gastroesophageal reflux disease)   . Heart murmur   . High cholesterol   . History of stomach ulcers   . Hypertension   . Lupus (HCC)   . MI, old   . Rheumatoid arthritis (HCC)   . Thyroid disease   . Thyroid mass   . Vertigo     Family History  Problem Relation Age of Onset  . Alcohol abuse Father   . Hypertension Father   . Heart disease Father   . Other Mother        tobacco use disorder, refuses to see a doctor  . Osteoporosis Sister   . Colon cancer Maternal Uncle   . Diabetes Maternal Grandmother   . Healthy Son   . Healthy Daughter   . Multiple sclerosis Daughter   . Colon polyps Neg Hx   . Esophageal cancer Neg Hx   . Kidney disease Neg Hx   . Stomach cancer Neg Hx   . Rectal cancer Neg Hx    Past Surgical History:  Procedure Laterality Date  . ABDOMINAL HYSTERECTOMY     done for menorrhagia, ovaries remain  . CERVICAL DISCECTOMY     plates and screws in neck  . CESAREAN SECTION    . COLONOSCOPY    . ESOPHAGEAL MANOMETRY N/A 02/12/2014   Procedure: ESOPHAGEAL MANOMETRY (EM);  Surgeon: Louis Meckel, MD;  Location: [facility];  Service: Endoscopy;  Laterality: N/A;  . ESOPHAGOGASTRODUODENOSCOPY  03/04/2011   Procedure: ESOPHAGOGASTRODUODENOSCOPY (EGD);  Surgeon: Louis Meckel, MD;  Location: [facility];  Service: Endoscopy;  Laterality: N/A;  BOTOX Injection  . ESOPHAGOGASTRODUODENOSCOPY (EGD) WITH PROPOFOL N/A 03/12/2019   Procedure: ESOPHAGOGASTRODUODENOSCOPY (EGD) WITH PROPOFOL;  Surgeon: Jeani Hawking, MD;  Location: [facility];  Service: Endoscopy;  Laterality: N/A;  . exc benign breast lump    . TONSILLECTOMY    . 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  Lives with husband, daughter  and 3 grands.     Immunization History  Administered Date(s) Administered  . Influenza Split 11/04/2015, 10/11/2017, 10/21/2018  . Influenza, Quadrivalent, Recombinant, Inj, Pf 10/11/2017  . Influenza,inj,Quad PF,6+ Mos 11/04/2015, 10/21/2018  . PFIZER Comirnaty(Gray Top)Covid-19 Tri-Sucrose Vaccine 05/10/2020  . PFIZER(Purple Top)SARS-COV-2 Vaccination 03/20/2019, 04/12/2019, 12/07/2019  . Pneumococcal Polysaccharide-23 01/09/2020  . Unspecified SARS-COV-2 Vaccination 04/12/2019, 12/07/2019, 05/11/2020  . Zoster Recombinat (Shingrix) 10/11/2017, 12/25/2017, 01/09/2020     Objective: Vital Signs: BP 130/84 (BP Location: Left Arm, Patient Position: Sitting, Cuff Size: Normal)   Pulse 74   Resp 16   Ht 5\' 5"  (1.651 m)   Wt 209 lb 9.6 oz (95.1 kg)   BMI 34.88 kg/m    Physical Exam Vitals and nursing note reviewed.  Constitutional:      Appearance: She is well-developed.  HENT:     Head: Normocephalic and atraumatic.  Eyes:     Conjunctiva/sclera: Conjunctivae normal.  Cardiovascular:     Rate and Rhythm: Normal rate and regular rhythm.     Heart sounds: Normal heart sounds.  Pulmonary:  Effort: Pulmonary effort is normal.     Breath sounds: Normal breath sounds.  Abdominal:     General: Bowel sounds are normal.     Palpations: Abdomen is soft.  Musculoskeletal:     Cervical back: Normal range of motion.  Lymphadenopathy:     Cervical: No cervical adenopathy.  Skin:    General: Skin is warm and dry.     Capillary Refill: Capillary refill takes less than 2 seconds.  Neurological:     Mental Status: She is alert and oriented to person, place, and time.  Psychiatric:        Behavior: Behavior normal.      Musculoskeletal Exam: C-spine was in good range of motion.  Shoulder joints, elbow joints, wrist joints, MCPs PIPs and DIPs with good range of motion with no synovitis.  Hip joints, knee joints, ankles, MTPs and PIPs with good range of motion with no synovitis.  She  had generalized hyperalgesia and positive tender points.  CDAI Exam: CDAI Score: -- Patient Global: --; Provider Global: -- Swollen: --; Tender: -- Joint Exam 06/24/2020   No joint exam has been documented for this visit   There is currently no information documented on the homunculus. Go to the Rheumatology activity and complete the homunculus joint exam.  Investigation: No additional findings.  Imaging: No results found.  Recent Labs: Lab Results  Component Value Date   WBC 5.9 04/15/2020   HGB 12.1 04/15/2020   PLT 286 04/15/2020   NA 142 04/15/2020   K 3.9 04/15/2020   CL 100 04/15/2020   CO2 33 (H) 04/15/2020   GLUCOSE 91 04/15/2020   BUN 16 04/15/2020   CREATININE 0.83 04/15/2020   BILITOT 0.5 04/15/2020   ALKPHOS 53 05/27/2019   AST 15 04/15/2020   ALT 12 04/15/2020   PROT 7.2 04/15/2020   ALBUMIN 3.9 05/27/2019   CALCIUM 9.5 04/15/2020   GFRAA 91 04/15/2020   QFTBGOLDPLUS NEGATIVE 01/22/2020    Speciality Comments: No specialty comments available.  Procedures:  No procedures performed Allergies: Aspirin, Demerol [meperidine], Hydrocodone-acetaminophen, Ibuprofen, Imdur [isosorbide nitrate], Isosorbide dinitrate, Meperidine hcl, Morphine, Oxycodone-acetaminophen, Procaine hcl, Toprol xl [metoprolol], Tramadol, and Codeine   Assessment / Plan:     Visit Diagnoses: Mixed connective tissue disease (HCC) - +ANA,+Sm,+RNP,+RF, arthritis:  -She had no synovitis on my examination.  She has been experiencing increased pain and discomfort which I believe is from underlying fibromyalgia.  She has been tolerating medications well.  She had been evaluated by pulmonary and cardiology.  Plan: Sedimentation rate, Anti-DNA antibody, double-stranded, C3 and C4, Urinalysis, Routine w reflex microscopic, Anti-Smith antibody  High risk medication use - Xeljanz 11 mg XR by mouth daily, MTX 0.6 ml sq once weekly (reduced dose low WBC count), folic acid 2 mg daily, and prednisone  2.5 mg daily. -Her labs have been stable.  We will check labs today and then every 3 months to monitor for drug toxicity.  Plan: CBC with Differential/Platelet, COMPLETE METABOLIC PANEL WITH GFR.  Instructions regarding the immunization were placed in the AVS.  She was also advised to discontinue medication in case she develops an infection and resume once the infection resolves.  Long term systemic steroid user - prednisone 2.5 mg daily.  Patient states that she is unable to taper prednisone.  Chronic pain of both shoulders-she had good range of motion with some discomfort.  Trochanteric bursitis of both hips-she has chronic discomfort in trochanteric area.  IT band stretches were discussed.  Fibromyalgia -she is having a flare of fibromyalgia.  She has generalized pain, positive tender points.  She has been under a lot of stress and has not been sleeping well.  Good sleep hygiene was discussed.  I will also refer her to integrative therapies.  Plan: Ambulatory referral to Physical Therapy  Primary insomnia - baclofen 10 mg at bedtime for muscle spasms and insomnia.  Chronic fatigue-related to fibromyalgia and insomnia.  Osteoporosis screening - DEXA updated on 02/12/20 T-sore -0.3  Other medical problems listed as follows:  History of vitamin D deficiency  History of hypertension-increased risk of heart disease with autoimmune disease was discussed.  Dietary modifications and exercise was emphasized.  History of high cholesterol  History of gastroesophageal reflux (GERD)  History of stomach ulcers  History of asthma  Orders: Orders Placed This Encounter  Procedures  . CBC with Differential/Platelet  . COMPLETE METABOLIC PANEL WITH GFR  . Sedimentation rate  . Anti-DNA antibody, double-stranded  . C3 and C4  . Urinalysis, Routine w reflex microscopic  . Anti-Smith antibody  . Ambulatory referral to Physical Therapy   No orders of the defined types were placed in this  encounter.    Follow-Up Instructions: Return in about 5 months (around 11/24/2020) for Rheumatoid arthritis, Osteoarthritis.   Pollyann Savoy, MD  Note - This record has been created using Animal nutritionist.  Chart creation errors have been sought, but may not always  have been located. Such creation errors do not reflect on  the standard of medical care.

## 2020-06-18 ENCOUNTER — Other Ambulatory Visit: Payer: Self-pay | Admitting: Family Medicine

## 2020-06-18 DIAGNOSIS — I10 Essential (primary) hypertension: Secondary | ICD-10-CM

## 2020-06-24 ENCOUNTER — Other Ambulatory Visit: Payer: Self-pay

## 2020-06-24 ENCOUNTER — Ambulatory Visit (INDEPENDENT_AMBULATORY_CARE_PROVIDER_SITE_OTHER): Payer: BC Managed Care – PPO | Admitting: Rheumatology

## 2020-06-24 ENCOUNTER — Encounter: Payer: Self-pay | Admitting: Rheumatology

## 2020-06-24 VITALS — BP 130/84 | HR 74 | Resp 16 | Ht 65.0 in | Wt 209.6 lb

## 2020-06-24 DIAGNOSIS — Z8679 Personal history of other diseases of the circulatory system: Secondary | ICD-10-CM

## 2020-06-24 DIAGNOSIS — Z7952 Long term (current) use of systemic steroids: Secondary | ICD-10-CM | POA: Diagnosis not present

## 2020-06-24 DIAGNOSIS — Z79899 Other long term (current) drug therapy: Secondary | ICD-10-CM | POA: Diagnosis not present

## 2020-06-24 DIAGNOSIS — R5382 Chronic fatigue, unspecified: Secondary | ICD-10-CM

## 2020-06-24 DIAGNOSIS — G8929 Other chronic pain: Secondary | ICD-10-CM

## 2020-06-24 DIAGNOSIS — M7061 Trochanteric bursitis, right hip: Secondary | ICD-10-CM

## 2020-06-24 DIAGNOSIS — M25511 Pain in right shoulder: Secondary | ICD-10-CM

## 2020-06-24 DIAGNOSIS — Z8639 Personal history of other endocrine, nutritional and metabolic disease: Secondary | ICD-10-CM

## 2020-06-24 DIAGNOSIS — Z8711 Personal history of peptic ulcer disease: Secondary | ICD-10-CM

## 2020-06-24 DIAGNOSIS — Z13820 Encounter for screening for osteoporosis: Secondary | ICD-10-CM

## 2020-06-24 DIAGNOSIS — B372 Candidiasis of skin and nail: Secondary | ICD-10-CM

## 2020-06-24 DIAGNOSIS — Z8719 Personal history of other diseases of the digestive system: Secondary | ICD-10-CM

## 2020-06-24 DIAGNOSIS — F5101 Primary insomnia: Secondary | ICD-10-CM

## 2020-06-24 DIAGNOSIS — Z8709 Personal history of other diseases of the respiratory system: Secondary | ICD-10-CM

## 2020-06-24 DIAGNOSIS — M797 Fibromyalgia: Secondary | ICD-10-CM

## 2020-06-24 DIAGNOSIS — M351 Other overlap syndromes: Secondary | ICD-10-CM

## 2020-06-24 DIAGNOSIS — M25512 Pain in left shoulder: Secondary | ICD-10-CM

## 2020-06-24 DIAGNOSIS — M7062 Trochanteric bursitis, left hip: Secondary | ICD-10-CM

## 2020-06-24 NOTE — Patient Instructions (Signed)
Standing Labs We placed an order today for your standing lab work.   Please have your standing labs drawn in September and every 3 months  If possible, please have your labs drawn 2 weeks prior to your appointment so that the provider can discuss your results at your appointment.  Please note that you may see your imaging and lab results in MyChart before we have reviewed them. We may be awaiting multiple results to interpret others before contacting you. Please allow our office up to 72 hours to thoroughly review all of the results before contacting the office for clarification of your results.  We have open lab daily: Monday through Thursday from 1:30-4:30 PM and Friday from 1:30-4:00 PM at the office of Dr. Pollyann Savoy, [facility].   Please be advised, all patients with office appointments requiring lab work will take precedent over walk-in lab work.  If possible, please come for your lab work on Monday and Friday afternoons, as you may experience shorter wait times. The office is located at [address], [place], [place] [address] No appointment is necessary.   Labs are drawn by [facility]. Please bring your co-pay at the time of your lab draw.  You may receive a bill from [facility] for your lab work.  If you wish to have your labs drawn at another location, please call the office 24 hours in advance to send orders.  If you have any questions regarding directions or hours of operation,  please call [phone].   As a reminder, please drink plenty of water prior to coming for your lab work. Thanks!  Vaccines You are taking a medication(s) that can suppress your immune system.  The following immunizations are recommended: . Flu annually . Covid-19  . Td/Tdap (tetanus, diphtheria, pertussis) every 10 years . Pneumonia (Prevnar 15 then Pneumovax 23 at least 1 year apart.  Alternatively, can take Prevnar 20 without needing additional dose) . Shingrix (after age  70): 2 doses from 4 weeks to 6 months apart  Please check with your PCP to make sure you are up to date.  If you test POSITIVE for COVID19 and have MILD to MODERATE symptoms: o First, call your PCP if you would like to receive COVID19 treatment AND o Hold your medications during the infection and for at least 1 week after your symptoms have resolved: - Injectable medication (Benlysta, Cimzia, Cosentyx, Enbrel, Humira, Orencia, Remicade, Simponi, Stelara, Taltz, Tremfya) - Methotrexate - Leflunomide (Arava) - Mycophenolate (Cellcept) - [name], Olumiant, or Rinvoq o If you take Actemra or Kevzara, you DO NOT need to hold these for COVID19 infection.  If you test POSITIVE for COVID19 and have NO symptoms: o First, call your PCP if you would like to receive COVID19 treatment AND o Hold your medications for at least 10 days after the day that you tested positive - Injectable medication (Benlysta, Cimzia, Cosentyx, Enbrel, Humira, Orencia, Remicade, Simponi, Stelara, Taltz, Tremfya) - Methotrexate - Leflunomide (Arava) - Mycophenolate (Cellcept) - [name], Olumiant, or Rinvoq o If you take Actemra or Kevzara, you DO NOT need to hold these for COVID19 infection.  If you have signs or symptoms of an infection or start antibiotics: . First, call your PCP for workup of your infection. . Hold your medication through the infection, until you complete your antibiotics, and until symptoms resolve if you take the following: o Injectable medication (Actemra, Benlysta, Cimzia, Cosentyx, Enbrel, Humira, Kevzara, Orencia, Remicade, Simponi, Stelara, Taltz, Tremfya) o Methotrexate o Leflunomide (  Arava) o Mycophenolate (Cellcept) o [name], Olumiant, or Rinvoq Heart Disease Prevention   Your inflammatory disease increases your risk of heart disease which includes heart attack, stroke, atrial fibrillation (irregular heartbeats), high blood pressure, heart failure and atherosclerosis (plaque in the  arteries).  It is important to reduce your risk by:   . Keep blood pressure, cholesterol, and blood sugar at healthy levels   . Smoking Cessation   . Maintain a healthy weight  o BMI 20-25   . Eat a healthy diet  o Plenty of fresh fruit, vegetables, and whole grains  o Limit saturated fats, foods high in sodium, and added sugars  o DASH and Mediterranean diet   . Increase physical activity  o Recommend moderate physically activity for 150 minutes per week/ 30 minutes a day for five days a week These can be broken up into three separate ten-minute sessions during the day.   . Reduce Stress  . Meditation, slow breathing exercises, yoga, coloring books  . Dental visits twice a year

## 2020-06-25 LAB — COMPLETE METABOLIC PANEL WITHOUT GFR
AG Ratio: 1.3 (calc) (ref 1.0–2.5)
ALT: 14 U/L (ref 6–29)
AST: 16 U/L (ref 10–35)
Albumin: 4.1 g/dL (ref 3.6–5.1)
Alkaline phosphatase (APISO): 55 U/L (ref 37–153)
BUN: 16 mg/dL (ref 7–25)
CO2: 30 mmol/L (ref 20–32)
Calcium: 9.3 mg/dL (ref 8.6–10.4)
Chloride: 102 mmol/L (ref 98–110)
Creat: 0.78 mg/dL (ref 0.50–1.05)
GFR, Est African American: 97 mL/min/1.73m2 (ref >=60)
GFR, Est Non African American: 84 mL/min/1.73m2 (ref >=60)
Globulin: 3.2 g/dL (ref 1.9–3.7)
Glucose, Bld: 91 mg/dL (ref 65–99)
Potassium: 3.7 mmol/L (ref 3.5–5.3)
Sodium: 142 mmol/L (ref 135–146)
Total Bilirubin: 0.7 mg/dL (ref 0.2–1.2)
Total Protein: 7.3 g/dL (ref 6.1–8.1)

## 2020-06-25 LAB — CBC WITH DIFFERENTIAL/PLATELET
Absolute Monocytes: 228 cells/uL (ref 200–950)
Basophils Absolute: 48 cells/uL (ref 0–200)
Basophils Relative: 1.2 %
Eosinophils Absolute: 48 cells/uL (ref 15–500)
Eosinophils Relative: 1.2 %
HCT: 39.4 % (ref 35.0–45.0)
Hemoglobin: 12.7 g/dL (ref 11.7–15.5)
Lymphs Abs: 644 cells/uL — ABNORMAL LOW (ref 850–3900)
MCH: 29.3 pg (ref 27.0–33.0)
MCHC: 32.2 g/dL (ref 32.0–36.0)
MCV: 90.8 fL (ref 80.0–100.0)
MPV: 10.5 fL (ref 7.5–12.5)
Monocytes Relative: 5.7 %
Neutro Abs: 3032 cells/uL (ref 1500–7800)
Neutrophils Relative %: 75.8 %
Platelets: 307 10*3/uL (ref 140–400)
RBC: 4.34 10*6/uL (ref 3.80–5.10)
RDW: 14.5 % (ref 11.0–15.0)
Total Lymphocyte: 16.1 %
WBC: 4 10*3/uL (ref 3.8–10.8)

## 2020-06-25 LAB — ANTI-DNA ANTIBODY, DOUBLE-STRANDED: ds DNA Ab: <1 [IU]/mL

## 2020-06-25 LAB — C3 AND C4
C3 Complement: 182 mg/dL (ref 83–193)
C4 Complement: 37 mg/dL (ref 15–57)

## 2020-06-25 LAB — URINALYSIS, ROUTINE W REFLEX MICROSCOPIC
Bilirubin Urine: NEGATIVE
Glucose, UA: NEGATIVE
Hgb urine dipstick: NEGATIVE
Ketones, ur: NEGATIVE
Leukocytes,Ua: NEGATIVE
Nitrite: NEGATIVE
Protein, ur: NEGATIVE
Specific Gravity, Urine: 1.027 (ref 1.001–1.035)
pH: 6.5 (ref 5.0–8.0)

## 2020-06-25 LAB — SEDIMENTATION RATE: Sed Rate: 17 mm/h (ref 0–30)

## 2020-06-25 LAB — ANTI-SMITH ANTIBODY: ENA SM Ab Ser-aCnc: 2.9 AI — AB

## 2020-06-25 NOTE — Progress Notes (Signed)
CBC shows low lymphocyte count due to medication use.  Comprehensive metabolic panel is normal.  Sed rate is normal.  Double-stranded DNA is negative.  Complements are normal.  UA is negative.  Sm antibody is positive and stable.  Labs do not indicate active autoimmune disease.

## 2020-06-29 ENCOUNTER — Other Ambulatory Visit: Payer: Self-pay | Admitting: Family Medicine

## 2020-07-17 ENCOUNTER — Ambulatory Visit: Payer: Self-pay

## 2020-07-17 ENCOUNTER — Ambulatory Visit (INDEPENDENT_AMBULATORY_CARE_PROVIDER_SITE_OTHER): Payer: BC Managed Care – PPO | Admitting: Rheumatology

## 2020-07-17 ENCOUNTER — Other Ambulatory Visit: Payer: Self-pay

## 2020-07-17 ENCOUNTER — Encounter: Payer: Self-pay | Admitting: Rheumatology

## 2020-07-17 ENCOUNTER — Telehealth: Payer: Self-pay | Admitting: Rheumatology

## 2020-07-17 VITALS — BP 135/96 | HR 92 | Resp 15 | Ht 65.0 in | Wt 209.0 lb

## 2020-07-17 DIAGNOSIS — M25562 Pain in left knee: Secondary | ICD-10-CM

## 2020-07-17 DIAGNOSIS — M7061 Trochanteric bursitis, right hip: Secondary | ICD-10-CM

## 2020-07-17 DIAGNOSIS — F5101 Primary insomnia: Secondary | ICD-10-CM

## 2020-07-17 DIAGNOSIS — Z8639 Personal history of other endocrine, nutritional and metabolic disease: Secondary | ICD-10-CM

## 2020-07-17 DIAGNOSIS — M7062 Trochanteric bursitis, left hip: Secondary | ICD-10-CM

## 2020-07-17 DIAGNOSIS — Z8719 Personal history of other diseases of the digestive system: Secondary | ICD-10-CM

## 2020-07-17 DIAGNOSIS — M797 Fibromyalgia: Secondary | ICD-10-CM

## 2020-07-17 DIAGNOSIS — R5382 Chronic fatigue, unspecified: Secondary | ICD-10-CM

## 2020-07-17 DIAGNOSIS — Z7952 Long term (current) use of systemic steroids: Secondary | ICD-10-CM | POA: Diagnosis not present

## 2020-07-17 DIAGNOSIS — M25512 Pain in left shoulder: Secondary | ICD-10-CM

## 2020-07-17 DIAGNOSIS — Z8679 Personal history of other diseases of the circulatory system: Secondary | ICD-10-CM

## 2020-07-17 DIAGNOSIS — M25561 Pain in right knee: Secondary | ICD-10-CM | POA: Diagnosis not present

## 2020-07-17 DIAGNOSIS — Z79899 Other long term (current) drug therapy: Secondary | ICD-10-CM

## 2020-07-17 DIAGNOSIS — Z8711 Personal history of peptic ulcer disease: Secondary | ICD-10-CM

## 2020-07-17 DIAGNOSIS — M351 Other overlap syndromes: Secondary | ICD-10-CM | POA: Diagnosis not present

## 2020-07-17 DIAGNOSIS — Z8709 Personal history of other diseases of the respiratory system: Secondary | ICD-10-CM

## 2020-07-17 DIAGNOSIS — M25511 Pain in right shoulder: Secondary | ICD-10-CM

## 2020-07-17 DIAGNOSIS — Z13820 Encounter for screening for osteoporosis: Secondary | ICD-10-CM

## 2020-07-17 DIAGNOSIS — G8929 Other chronic pain: Secondary | ICD-10-CM

## 2020-07-17 MED ORDER — TRIAMCINOLONE ACETONIDE 40 MG/ML IJ SUSP
40.0000 mg | INTRAMUSCULAR | Status: AC | PRN
  Administered 2020-07-17: 40 mg via INTRA_ARTICULAR

## 2020-07-17 MED ORDER — LIDOCAINE HCL 1 % IJ SOLN
1.5000 mL | INTRAMUSCULAR | Status: AC | PRN
  Administered 2020-07-17: 1.5 mL

## 2020-07-17 NOTE — Patient Instructions (Addendum)
Tofacitinib Oral Tablets What is this medication? TOFACITINIB (TOE fa SYE ti nib) treats rheumatoid arthritis, psoriatic arthritis, and polyarticular juvenile idiopathic arthritis. It is also used to treat ankylosing spondylitis and ulcerative colitis. It works on [employer]. It belongs to a group of medicines called JAK inhibitors. This medicine may be used for other purposes; ask your health care provider orpharmacist if you have questions. COMMON BRAND NAME(S): Morrie Sheldon What should I tell my care team before I take this medication? They need to know if you have any of these conditions: blood clots cancer diabetes (high blood sugar) heart disease high blood pressure high cholesterol HIV or AIDS immune system problems infection especially a virus infection such as chickenpox, cold sores, hepatitis B or herpes infection such as tuberculosis (TB) or other bacterial, fungal or viral infections kidney disease liver disease low blood counts (white cells, platelets, or red blood cells) lung or breathing disease (asthma, COPD) organ transplant smoke tobacco cigarettes stomach or intestine problems an unusual or allergic reaction to tofacitinib, other medicines, foods, dyes, or preservatives pregnant or trying to get pregnant breast-feeding How should I use this medication? Take this medicine by mouth with water. Take it as directed on the prescription label. You can take it with or without food. If it upsets your stomach, take itwith food. Keep taking it unless your health care provider tells you to stop. A special MedGuide will be given to you by the pharmacist with eachprescription and refill. Be sure to read this information carefully each time. Talk to your health care provider about the use of this medicine in children. While this drug may be prescribed for children as young as 2 years for selectedconditions, precautions do apply. Overdosage: If you think you have taken too much of  this medicine contact apoison control center or emergency room at once. NOTE: This medicine is only for you. Do not share this medicine with others. What if I miss a dose? If you miss a dose, take it as soon as you can. If it is almost time for yournext dose, take only that dose. Do not take double or extra doses. What may interact with this medication? Do not take this medicine with any of the following medications: upadacitinib This medicine may also interact with the following medications: antiviral medicines for hepatitis, HIV or AIDS azathioprine biologic medicines such as abatacept, adalimumab, anakinra, certolizumab, etanercept, golimumab, infliximab, ofatumumab, rituximab, sarilumab, secukinumab, tocilizumab, ustekinumab, vedolizumab certain medicines for fungal infections like fluconazole, itraconazole, ketoconazole, voriconazole certain medicines for seizures like carbamazepine, phenobarbital, phenytoin cyclosporine live vaccines medicines that lower your chance of fighting infection rifampin supplements, such as St. John's wort tacrolimus This list may not describe all possible interactions. Give your health care provider a list of all the medicines, herbs, non-prescription drugs, or dietary supplements you use. Also tell them if you smoke, drink alcohol, or use illegaldrugs. Some items may interact with your medicine. What should I watch for while using this medication? Visit your health care provider for regular checks on your progress. Tell your health care provider if your symptoms do not start to get better or if they getworse. You may need blood work while you are taking this medicine. This medicine may increase your risk of getting an infection. Call your health care provider for advice if you get a fever, chills, sore throat, or other symptoms of a cold or flu. Do not treat yourself. Try to avoid being aroundpeople who are sick. Avoid taking medicines that  contain aspirin,  acetaminophen, ibuprofen, naproxen, or ketoprofen unless instructed by your health care provider. Thesemedicines may hide a fever. Talk to your health care provider about your risk of cancer. You may be more atrisk for certain types of cancer if you take this medicine. Do not become pregnant while taking this medicine. Women should inform their health care provider if they wish to become pregnant or think they might be pregnant. There is potential for serious harm to an unborn child. Talk to yourhealth care provider for more information. Do not breast-feed an infant while taking this medicine or for at least 18hours after stopping it. What side effects may I notice from receiving this medication? Side effects that you should report to your doctor or health care professionalas soon as possible: allergic reactions (skin rash, itching or hives; swelling of the face, lips, or tongue) blood clot (chest pain; shortness of breath; pain, swelling, or warmth in the leg) heart attack (trouble breathing; pain or tightness in the chest, neck, back or arms; unusually weak or tired) infection (fever, chills, cough, sore throat, pain or trouble passing urine) light colored stool liver injury (dark yellow or brown urine; general ill feeling or flu-like symptoms; loss of appetite, right upper belly pain; unusually weak or tired, yellowing of the eyes or skin) low red blood cell counts (trouble breathing; feeling faint; lightheaded, falls; unusually weak or tired) stroke (changes in vision; confusion; trouble speaking or understanding; severe headaches; sudden numbness or weakness of the face, arm or leg; trouble walking; dizziness; loss of balance or coordination) tears in the stomach or intestines (fever; stomach pain; sudden change in bowel habits) Side effects that usually do not require medical attention (report to yourdoctor or health care professional if they continue or are  bothersome): diarrhea headache muscle pain nasal congestion (runny or stuffy nose) This list may not describe all possible side effects. Call your doctor for medical advice about side effects. You may report side effects to FDA at1-800-FDA-1088. Where should I keep my medication? Keep out of the reach of children and pets. Store at room temperature between 20 and 25 degrees C (68 and 77 degrees F).Get rid of any unused medicine after the expiration date. To get rid of medicines that are no longer needed or have expired: Take the medicine to a medicine take-back program. Check with your pharmacy or law enforcement to find a location. If you cannot return the medicine, check the label or package insert to see if the medicine should be thrown out in the garbage or flushed down the toilet. If you are not sure, ask your health care provider. If it is safe to put it in the trash, empty the medicine out of the container. Mix the medicine with cat litter, dirt, coffee grounds, or other unwanted substance. Seal the mixture in a bag or container. Put it in the trash. NOTE: This sheet is a summary. It may not cover all possible information. If you have questions about this medicine, talk to your doctor, pharmacist, orhealth care provider.  2022 Elsevier/Gold Standard (2020-01-04 14:29:52)   Standing Labs We placed an order today for your standing lab work.   Please have your standing labs drawn in September every 3 months  If possible, please have your labs drawn 2 weeks prior to your appointment so that the provider can discuss your results at your appointment.  Please note that you may see your imaging and lab results in MyChart before we have reviewed them. We  may be awaiting multiple results to interpret others before contacting you. Please allow our office up to 72 hours to thoroughly review all of the results before contacting the office for clarification of your results.  We have open lab  daily: Monday through Thursday from 1:30-4:30 PM and Friday from 1:30-4:00 PM at the office of Dr. Pollyann Savoy, [facility].   Please be advised, all patients with office appointments requiring lab work will take precedent over walk-in lab work.  If possible, please come for your lab work on Monday and Friday afternoons, as you may experience shorter wait times. The office is located at [address], [address], [place], [place] [address] No appointment is necessary.   Labs are drawn by [facility]. Please bring your co-pay at the time of your lab draw.  You may receive a bill from [facility] for your lab work.  If you wish to have your labs drawn at another location, please call the office 24 hours in advance to send orders.  If you have any questions regarding directions or hours of operation,  please call [phone].   As a reminder, please drink plenty of water prior to coming for your lab work. Thanks!  Vaccines You are taking a medication(s) that can suppress your immune system.  The following immunizations are recommended: Flu annually Covid-19  Td/Tdap (tetanus, diphtheria, pertussis) every 10 years Pneumonia (Prevnar 15 then Pneumovax 23 at least 1 year apart.  Alternatively, can take Prevnar 20 without needing additional dose) Shingrix (after age 3): 2 doses from 4 weeks to 6 months apart  Please check with your PCP to make sure you are up to date.   If you test POSITIVE for COVID19 and have MILD to MODERATE symptoms: First, call your PCP if you would like to receive COVID19 treatment AND Hold your medications during the infection and for at least 1 week after your symptoms have resolved: Injectable medication (Benlysta, Cimzia, Cosentyx, Enbrel, Humira, Orencia, Remicade, Simponi, Stelara, Taltz, Tremfya) Methotrexate Leflunomide (Arava) Mycophenolate (Cellcept) [name], Olumiant, or Rinvoq If you take Actemra or Kevzara, you DO NOT need to hold these for  COVID19 infection.  If you test POSITIVE for COVID19 and have NO symptoms: First, call your PCP if you would like to receive COVID19 treatment AND Hold your medications for at least 10 days after the day that you tested positive Injectable medication (Benlysta, Cimzia, Cosentyx, Enbrel, Humira, Orencia, Remicade, Simponi, Stelara, Taltz, Tremfya) Methotrexate Leflunomide (Arava) Mycophenolate (Cellcept) [name], Olumiant, or Rinvoq If you take Actemra or Kevzara, you DO NOT need to hold these for COVID19 infection.   If you have signs or symptoms of an infection or start antibiotics: First, call your PCP for workup of your infection. Hold your medication through the infection, until you complete your antibiotics, and until symptoms resolve if you take the following: Injectable medication (Actemra, Benlysta, Cimzia, Cosentyx, Enbrel, Humira, Kevzara, Orencia, Remicade, Simponi, Stelara, Taltz, Tremfya) Methotrexate Leflunomide (Arava) Mycophenolate (Cellcept) [name], Olumiant, or Rinvoq   Heart Disease Prevention   Your inflammatory disease increases your risk of heart disease which includes heart attack, stroke, atrial fibrillation (irregular heartbeats), high blood pressure, heart failure and atherosclerosis (plaque in the arteries).  It is important to reduce your risk by:   Keep blood pressure, cholesterol, and blood sugar at healthy levels   Smoking Cessation   Maintain a healthy weight BMI 20-25   Eat a healthy diet  Plenty of fresh fruit, vegetables, and whole grains  Limit saturated  fats, foods high in sodium, and added sugars  DASH and Mediterranean diet   Increase physical activity  Recommend moderate physically activity for 150 minutes per week/ 30 minutes a day for five days a week These can be broken up into three separate ten-minute sessions during the day.   Reduce Stress  Meditation, slow breathing exercises, yoga, coloring books  Dental visits twice a year     Journal for Nurse Practitioners, 15(4), [phone]. Retrieved October 25, 2017 from http://clinicalkey.com/nursing">  Knee Exercises Ask your health care provider which exercises are safe for you. Do exercises exactly as told by your health care provider and adjust them as directed. It is normal to feel mild stretching, pulling, tightness, or discomfort as you do these exercises. Stop right away if you feel sudden pain or your pain gets worse. Do not begin these exercises until told by your health care provider. Stretching and range-of-motion exercises These exercises warm up your muscles and joints and improve the movement and flexibility of your knee. These exercises also help to relieve pain andswelling. Knee extension, prone Lie on your abdomen (prone position) on a bed. Place your left / right knee just beyond the edge of the surface so your knee is not on the bed. You can put a towel under your left / right thigh just above your kneecap for comfort. Relax your leg muscles and allow gravity to straighten your knee (extension). You should feel a stretch behind your left / right knee. Hold this position for __________ seconds. Scoot up so your knee is supported between repetitions. Repeat __________ times. Complete this exercise __________ times a day. Knee flexion, active  Lie on your back with both legs straight. If this causes back discomfort, bend your left / right knee so your foot is flat on the floor. Slowly slide your left / right heel back toward your buttocks. Stop when you feel a gentle stretch in the front of your knee or thigh (flexion). Hold this position for __________ seconds. Slowly slide your left / right heel back to the starting position. Repeat __________ times. Complete this exercise __________ times a day. Quadriceps stretch, prone  Lie on your abdomen on a firm surface, such as a bed or padded floor. Bend your left / right knee and hold your ankle. If you cannot reach  your ankle or pant leg, loop a belt around your foot and grab the belt instead. Gently pull your heel toward your buttocks. Your knee should not slide out to the side. You should feel a stretch in the front of your thigh and knee (quadriceps). Hold this position for __________ seconds. Repeat __________ times. Complete this exercise __________ times a day. Hamstring, supine Lie on your back (supine position). Loop a belt or towel over the ball of your left / right foot. The ball of your foot is on the walking surface, right under your toes. Straighten your left / right knee and slowly pull on the belt to raise your leg until you feel a gentle stretch behind your knee (hamstring). Do not let your knee bend while you do this. Keep your other leg flat on the floor. Hold this position for __________ seconds. Repeat __________ times. Complete this exercise __________ times a day. Strengthening exercises These exercises build strength and endurance in your knee. Endurance is theability to use your muscles for a long time, even after they get tired. Quadriceps, isometric This exercise stretches the muscles in front of your thigh (quadriceps) without  moving your knee joint (isometric). Lie on your back with your left / right leg extended and your other knee bent. Put a rolled towel or small pillow under your knee if told by your health care provider. Slowly tense the muscles in the front of your left / right thigh. You should see your kneecap slide up toward your hip or see increased dimpling just above the knee. This motion will push the back of the knee toward the floor. For __________ seconds, hold the muscle as tight as you can without increasing your pain. Relax the muscles slowly and completely. Repeat __________ times. Complete this exercise __________ times a day. Straight leg raises This exercise stretches the muscles in front of your thigh (quadriceps) and the muscles that move your hips (hip  flexors). Lie on your back with your left / right leg extended and your other knee bent. Tense the muscles in the front of your left / right thigh. You should see your kneecap slide up or see increased dimpling just above the knee. Your thigh may even shake a bit. Keep these muscles tight as you raise your leg 4-6 inches (10-15 cm) off the floor. Do not let your knee bend. Hold this position for __________ seconds. Keep these muscles tense as you lower your leg. Relax your muscles slowly and completely after each repetition. Repeat __________ times. Complete this exercise __________ times a day. Hamstring, isometric Lie on your back on a firm surface. Bend your left / right knee about __________ degrees. Dig your left / right heel into the surface as if you are trying to pull it toward your buttocks. Tighten the muscles in the back of your thighs (hamstring) to "dig" as hard as you can without increasing any pain. Hold this position for __________ seconds. Release the tension gradually and allow your muscles to relax completely for __________ seconds after each repetition. Repeat __________ times. Complete this exercise __________ times a day. Hamstring curls If told by your health care provider, do this exercise while wearing ankle weights. Begin with __________ lb weights. Then increase the weight by 1 lb (0.5 kg) increments. Do not wear ankle weights that are more than __________ lb. Lie on your abdomen with your legs straight. Bend your left / right knee as far as you can without feeling pain. Keep your hips flat against the floor. Hold this position for __________ seconds. Slowly lower your leg to the starting position. Repeat __________ times. Complete this exercise __________ times a day. Squats This exercise strengthens the muscles in front of your thigh and knee (quadriceps). Stand in front of a table, with your feet and knees pointing straight ahead. You may rest your hands on the  table for balance but not for support. Slowly bend your knees and lower your hips like you are going to sit in a chair. Keep your weight over your heels, not over your toes. Keep your lower legs upright so they are parallel with the table legs. Do not let your hips go lower than your knees. Do not bend lower than told by your health care provider. If your knee pain increases, do not bend as low. Hold the squat position for __________ seconds. Slowly push with your legs to return to standing. Do not use your hands to pull yourself to standing. Repeat __________ times. Complete this exercise __________ times a day. Wall slides This exercise strengthens the muscles in front of your thigh and knee (quadriceps). Lean your back against a  smooth wall or door, and walk your feet out 18-24 inches (46-61 cm) from it. Place your feet hip-width apart. Slowly slide down the wall or door until your knees bend __________ degrees. Keep your knees over your heels, not over your toes. Keep your knees in line with your hips. Hold this position for __________ seconds. Repeat __________ times. Complete this exercise __________ times a day. Straight leg raises This exercise strengthens the muscles that rotate the leg at the hip and move it away from your body (hip abductors). Lie on your side with your left / right leg in the top position. Lie so your head, shoulder, knee, and hip line up. You may bend your bottom knee to help you keep your balance. Roll your hips slightly forward so your hips are stacked directly over each other and your left / right knee is facing forward. Leading with your heel, lift your top leg 4-6 inches (10-15 cm). You should feel the muscles in your outer hip lifting. Do not let your foot drift forward. Do not let your knee roll toward the ceiling. Hold this position for __________ seconds. Slowly return your leg to the starting position. Let your muscles relax completely after each  repetition. Repeat __________ times. Complete this exercise __________ times a day. Straight leg raises This exercise stretches the muscles that move your hips away from the front of the pelvis (hip extensors). Lie on your abdomen on a firm surface. You can put a pillow under your hips if that is more comfortable. Tense the muscles in your buttocks and lift your left / right leg about 4-6 inches (10-15 cm). Keep your knee straight as you lift your leg. Hold this position for __________ seconds. Slowly lower your leg to the starting position. Let your leg relax completely after each repetition. Repeat __________ times. Complete this exercise __________ times a day. This information is not intended to replace advice given to you by your health care provider. Make sure you discuss any questions you have with your healthcare provider. Document Revised: 10/26/2017 Document Reviewed: 10/26/2017 Elsevier Patient Education  2022 [employer].

## 2020-07-17 NOTE — Telephone Encounter (Signed)
Dr. Antoine Poche, I saw Patricia Reed in the office today.  She has history of mixed connective tissue disease.  Her disease is very well controlled on the combination of methotrexate and Xeljanz XR.  Now [name] has a new black box warning of increased risk of MACE.  I had a detailed discussion with the patient the new black box warning.  She does not want to stop [name] as her arthritis was not controlled on methotrexate monotherapy. .  She had an episode of precordial pain in the past and was evaluated by you in April 2021.  She also has history of hyperlipidemia.  I would appreciate if you can kindly review her chart and let me know if it is okay to continue [place] or we should switch her treatment based on the current black box warning.  I have advised patient to schedule an appointment with you. Thank you, Pollyann Savoy MD

## 2020-07-17 NOTE — Progress Notes (Addendum)
Office Visit Note  [name]             Date of Birth: Jul 18, 1960           MRN: [id number]             PCP: [place], [name], MD Referring: [place], [name], MD Visit Date: 07/17/2020 Occupation: @GUAROCC @  Subjective:  Pain in both knees.   History of Present Illness: Patricia Reed is a 60 y.o. female with a history of mixed connective tissue disease, fibromyalgia and osteoporosis.  She states about 4 days ago she started having sudden pain and discomfort in her knee joints.  She states her left knee joint is more painful and having difficulty walking.  She has difficulty climbing stairs.  She has some discomfort in her elbows.  None of the other joints are painful.  She denies any history of oral ulcers, nasal ulcers, malar rash, photosensitivity, Raynaud's phenomenon or lymphadenopathy.  Activities of Daily Living:  Patient reports morning stiffness for all day. Patient Reports nocturnal pain.  Difficulty dressing/grooming: Denies Difficulty climbing stairs: Reports Difficulty getting out of chair: Reports Difficulty using hands for taps, buttons, cutlery, and/or writing: Reports  Review of Systems  Constitutional:  Positive for fatigue.  HENT:  Positive for mouth dryness. Negative for mouth sores and nose dryness.   Eyes:  Negative for pain, itching and dryness.  Respiratory:  Negative for shortness of breath and difficulty breathing.   Cardiovascular:  Negative for chest pain and palpitations.  Gastrointestinal:  Negative for blood in stool, constipation and diarrhea.  Endocrine: Negative for increased urination.  Genitourinary:  Negative for difficulty urinating.  Musculoskeletal:  Positive for joint pain, joint pain, joint swelling and morning stiffness. Negative for myalgias, muscle tenderness and myalgias.  Skin:  Negative for color change, rash, redness and sensitivity to sunlight.  Allergic/Immunologic: Negative for susceptible to infections.   Neurological:  Positive for dizziness and numbness. Negative for headaches and memory loss.  Hematological:  Positive for bruising/bleeding tendency. Negative for swollen glands.  Psychiatric/Behavioral:  Positive for sleep disturbance. Negative for confusion.    PMFS History:  Patient Active Problem List   Diagnosis Date Noted   Hypokalemia 07/07/2019   Precordial chest pain 05/04/2019   Educated about COVID-19 virus infection 05/04/2019   Persistent vomiting    Abdominal pain, epigastric    Nausea & vomiting 03/11/2019   Gastritis    Nausea without vomiting 02/28/2019   Class 2 obesity with body mass index (BMI) of 37.0 to 37.9 in adult 11/25/2018   Mixed connective tissue disease (HCC) 08/14/2017   Recurrent genital herpes 05/12/2017   Shortness of breath 04/15/2017   Dizziness 02/04/2017   Situational anxiety 06/08/2016   Atypical chest pain 06/08/2016   Trapezius muscle spasm 04/23/2016   Fibromyalgia 02/13/2016   Primary insomnia 02/13/2016   Chronic fatigue 02/13/2016   Vitamin D deficiency 11/25/2015   Autoimmune disease (HCC) 11/23/2015   High risk medication use 11/23/2015   Colon cancer screening 01/25/2014   Anterior neck pain 12/01/2013   Inflammatory arthritis 08/18/2012   Allergic rhinitis 06/23/2012   HTN (hypertension) 06/23/2012   High cholesterol 06/23/2012   Thyroid nodule - benign 06/23/2012   Arthralgia 06/23/2012   Mild intermittent asthma    Sickle cell trait (HCC)    History of stomach ulcers    ESOPHAGEAL STRICTURE 10/04/2007   GERD 10/04/2007   Atrophic 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   Dysphagia, pharyngoesophageal phase 10/04/2007  Past Medical History:  Diagnosis Date   Allergy    Anemia    Asthma    Blood transfusion without reported diagnosis    Fibromyalgia    GERD (gastroesophageal reflux disease)    Heart murmur    High cholesterol    History of stomach ulcers    Hypertension    Lupus (HCC)    MI, old    Rheumatoid arthritis (HCC)     Thyroid disease    Thyroid mass    Vertigo     Family History  Problem Relation Age of Onset   Alcohol abuse Father    Hypertension Father    Heart disease Father    Other Mother        tobacco use disorder, refuses to see a doctor   Osteoporosis Sister    Colon cancer Maternal Uncle    Diabetes Maternal Grandmother    Healthy Son    Healthy Daughter    Multiple sclerosis Daughter    Colon polyps Neg Hx    Esophageal cancer Neg Hx    Kidney disease Neg Hx    Stomach cancer Neg Hx    Rectal cancer Neg Hx    Past Surgical History:  Procedure Laterality Date   ABDOMINAL HYSTERECTOMY     done for menorrhagia, ovaries remain   CERVICAL DISCECTOMY     plates and screws in neck   CESAREAN SECTION     COLONOSCOPY     ESOPHAGEAL MANOMETRY N/A 02/12/2014   Procedure: ESOPHAGEAL MANOMETRY (EM);  Surgeon: Louis Meckel, MD;  Location: [facility];  Service: Endoscopy;  Laterality: N/A;   ESOPHAGOGASTRODUODENOSCOPY  03/04/2011   Procedure: ESOPHAGOGASTRODUODENOSCOPY (EGD);  Surgeon: Louis Meckel, MD;  Location: [facility];  Service: Endoscopy;  Laterality: N/A;  BOTOX Injection   ESOPHAGOGASTRODUODENOSCOPY (EGD) WITH PROPOFOL N/A 03/12/2019   Procedure: ESOPHAGOGASTRODUODENOSCOPY (EGD) WITH PROPOFOL;  Surgeon: Jeani Hawking, MD;  Location[facility];  Service: Endoscopy;  Laterality: N/A;   exc benign breast lump     TONSILLECTOMY     UPPER GASTROINTESTINAL ENDOSCOPY     Social History   Social History Narrative   First grade Geologist, 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     Immunization History  Administered Date(s) Administered   Influenza Split 11/04/2015, 10/11/2017, 10/21/2018   Influenza, Quadrivalent, Recombinant, Inj, Pf 10/11/2017   Influenza,inj,Quad PF,6+ Mos 11/04/2015, 10/21/2018   PFIZER Comirnaty(Gray Top)Covid-19 Tri-Sucrose Vaccine 05/10/2020   PFIZER(Purple Top)SARS-COV-2 Vaccination 03/20/2019, 04/12/2019, 12/07/2019    Pneumococcal Polysaccharide-23 01/09/2020   Unspecified SARS-COV-2 Vaccination 04/12/2019, 12/07/2019, 05/11/2020   Zoster Recombinat (Shingrix) 10/11/2017, 12/25/2017, 01/09/2020     Objective: Vital Signs: BP (!) 135/96 (BP Location: Left Arm, Patient Position: Sitting, Cuff Size: Normal)   Pulse 92   Resp 15   Ht 5\' 5"  (1.651 m)   Wt 209 lb (94.8 kg)   BMI 34.78 kg/m    Physical Exam Vitals and nursing note reviewed.  Constitutional:      Appearance: She is well-developed.  HENT:     Head: Normocephalic and atraumatic.  Eyes:     Conjunctiva/sclera: Conjunctivae normal.  Cardiovascular:     Rate and Rhythm: Normal rate and regular rhythm.     Heart sounds: Normal heart sounds.  Pulmonary:     Effort: Pulmonary effort is normal.     Breath sounds: Normal breath sounds.  Abdominal:     General: Bowel sounds are normal.     Palpations: Abdomen is soft.  Musculoskeletal:     Cervical back: Normal range of motion.  Lymphadenopathy:     Cervical: No cervical adenopathy.  Skin:    General: Skin is warm and dry.     Capillary Refill: Capillary refill takes less than 2 seconds.  Neurological:     Mental Status: She is alert and oriented to person, place, and time.  Psychiatric:        Behavior: Behavior normal.     Musculoskeletal Exam: C-spine was in good range of motion.  Shoulder joints, elbow joints, wrist joints, MCPs PIPs and DIPs with good range of motion with no synovitis.  Hip joints with good range of motion.  She had discomfort range of motion of bilateral knee joints with some warmth on palpation of her left knee joint.  No effusion was noted.  There was no tenderness over ankles or MTPs.  CDAI Exam: CDAI Score: -- Patient Global: --; Provider Global: -- Swollen: --; Tender: -- Joint Exam 07/17/2020   No joint exam has been documented for this visit   There is currently no information documented on the homunculus. Go to the Rheumatology activity and  complete the homunculus joint exam.  Investigation: No additional findings.  Imaging: XR KNEE 3 VIEW LEFT  Result Date: 07/17/2020 Mild medial compartment narrowing was noted.  Mild patellofemoral narrowing was noted.  No chondrocalcinosis was noted. Impression: These findings are consistent mild osteoarthritis and mild chondromalacia patella.  XR KNEE 3 VIEW RIGHT  Result Date: 07/17/2020 Mild to moderate medial compartment narrowing and mild patellofemoral narrowing was noted.  No chondrocalcinosis was noted. Impression: These findings are consistent with mild osteoarthritis and mild chondromalacia patella.   Recent Labs: Lab Results  Component Value Date   WBC 4.0 06/24/2020   HGB 12.7 06/24/2020   PLT 307 06/24/2020   NA 142 06/24/2020   K 3.7 06/24/2020   CL 102 06/24/2020   CO2 30 06/24/2020   GLUCOSE 91 06/24/2020   BUN 16 06/24/2020   CREATININE 0.78 06/24/2020   BILITOT 0.7 06/24/2020   ALKPHOS 53 05/27/2019   AST 16 06/24/2020   ALT 14 06/24/2020   PROT 7.3 06/24/2020   ALBUMIN 3.9 05/27/2019   CALCIUM 9.3 06/24/2020   GFRAA 97 06/24/2020   QFTBGOLDPLUS NEGATIVE 01/22/2020    Speciality Comments: No specialty comments available.  Procedures:  Large Joint Inj: L knee on 07/17/2020 1:57 PM Indications: pain Details: 27 G 1.5 in needle, medial approach  Arthrogram: No  Medications: 40 mg triamcinolone acetonide 40 MG/ML; 1.5 mL lidocaine 1 % Aspirate: 0 mL Outcome: tolerated well, no immediate complications Procedure, treatment alternatives, risks and benefits explained, specific risks discussed. Consent was given by the patient. Immediately prior to procedure a time out was called to verify the correct patient, procedure, equipment, support staff and site/side marked as required. Patient was prepped and draped in the usual sterile fashion.    Allergies: Aspirin, Demerol [meperidine], Hydrocodone-acetaminophen, Ibuprofen, Imdur [isosorbide nitrate],  Isosorbide dinitrate, Meperidine hcl, Morphine, Oxycodone-acetaminophen, Procaine hcl, Toprol xl [metoprolol], Tramadol, and Codeine   Assessment / Plan:     Visit Diagnoses: Mixed connective tissue disease (HCC) - +ANA,+Sm,+RNP,+RF, arthritis.  She has been doing well and her labs have been stable.  She states for the last 1 week she has been having increased pain and swelling in her bilateral knee joints.  She is having difficulty walking due to discomfort in her left knee.  She denies any history of oral ulcers, nasal ulcers, malar rash,  photosensitivity, Raynaud's phenomenon.  None of the other joints are swollen today.  High risk medication use - Xeljanz 11 mg XR by mouth daily, MTX 0.6 ml sq once weekly (reduced dose low WBC count), folic acid 2 mg daily, and prednisone 2.5 mg daily.  MACE blackbox warning on [name] was discussed at length.  Alternative treatments were discussed.  Patient wants to continue on [place] despite the black box warning.  She states her arthritis was not controlled prior to [place].  She understands risk of cardiac event, stroke, and malignancy.  She has a history of hyperlipidemia and had an episode of atypical chest pain in the past.  She was evaluated by Dr. Antoine Poche in April 2021.  I advised her to schedule an appointment with Dr. Antoine Poche for evaluation and to assess the risk of heart disease.  I will also forward a note to Dr. Antoine Poche to get his opinion.  A handout was again placed in the AVS.  She has been advised to stop Xeljanz and methotrexate in case she develops an infection and restart when the infection resolves.  Long term systemic steroid user - she is unable to taper prednisone.  Acute pain of both knees -she is having pain and discomfort in the bilateral knee joints today.  She has some warmth on palpation of her left knee joint.  She has been limping and having difficulty walking.  Plan: XR KNEE 3 VIEW RIGHT, XR KNEE 3 VIEW LEFT.  X-ray showed bilateral  mild osteoarthritis and chondromalacia patella.  A handout on knee joint exercises was given. Different treatment options were discussed.  Per patient's request left knee joint was injected with cortisone after indications, side effects, and contraindications were discussed.  Tolerated the procedure well and postprocedure instructions were given.  She is advised to contact us in case she has persistent symptoms.  Trochanteric bursitis of both hips-she has off-and-on discomfort.  Chronic pain of both shoulders-she had good range of motion today without discomfort.  Fibromyalgia -is on baclofen 10 mg at bedtime  Chronic fatigue-she continues to have some fatigue due to insomnia.  Primary insomnia-good sleep hygiene was discussed.  Osteoporosis screening - DEXA updated on 02/12/20 T-sore -0.3  History of vitamin D deficiency  History of hypertension-blood pressure is elevated today.  She states she did not take her blood pressure medication today.  Increased risk of heart disease with autoimmune disease was discussed.  Monitoring blood pressure closely was discussed.  History of high cholesterol-I do not see a lipid panel in the system.  Have advised her to get the lipid panel with her PCP.  History of gastroesophageal reflux (GERD)  History of stomach ulcers  History of asthma  Orders: Orders Placed This Encounter  Procedures   Large Joint Inj   XR KNEE 3 VIEW RIGHT   XR KNEE 3 VIEW LEFT   No orders of the defined types were placed in this encounter.   Follow-Up Instructions: Return for MCTD.   Pollyann Savoy, MD  Note - This record has been created using Animal nutritionist.  Chart creation errors have been sought, but may not always  have been located. Such creation errors do not reflect on  the standard of medical care.

## 2020-07-23 ENCOUNTER — Telehealth: Payer: Self-pay | Admitting: Rheumatology

## 2020-07-23 NOTE — Telephone Encounter (Signed)
Patient mentioned at the last visit that she may have an underlying heart disease.  I contacted her cardiologist Dr. Ernestene Mention has a new black box warning for increased incidence of MACE.  He responded that patient has no contraindication to [place]. Pollyann Savoy, MD

## 2020-08-06 ENCOUNTER — Other Ambulatory Visit: Payer: Self-pay

## 2020-08-06 ENCOUNTER — Other Ambulatory Visit: Payer: Self-pay | Admitting: Physician Assistant

## 2020-08-06 NOTE — Telephone Encounter (Signed)
Next Visit: 11/25/2020  Last Visit: 07/17/2020  Last Fill: 04/01/2020  DX: Mixed connective tissue disease  Current Dose per office note 07/17/2020: MTX 0.6 ml sq once weekly  Labs: 06/24/2020 CBC shows low lymphocyte count due to medication use.  Comprehensive metabolicpanel is normal.    Okay to refill MTX?

## 2020-08-07 ENCOUNTER — Ambulatory Visit: Payer: BC Managed Care – PPO | Admitting: Family Medicine

## 2020-08-07 ENCOUNTER — Encounter: Payer: Self-pay | Admitting: Family Medicine

## 2020-08-14 ENCOUNTER — Telehealth: Payer: Self-pay | Admitting: Rheumatology

## 2020-08-14 NOTE — Telephone Encounter (Signed)
Left knee was evaluated on 07/17/20.  She had a cortisone injection at that visit.

## 2020-08-14 NOTE — Telephone Encounter (Signed)
Okay to schedule MRI of the left knee per patient's request and suggestion of Dr. Yates Decamp to rule out internal derangement.

## 2020-08-14 NOTE — Telephone Encounter (Signed)
Patient had appointment with Rheumatologist in [facility] yesterday. Dr. Larena Glassman (?) feels like her knee issues may be mechanical, and wants Dr. Corliss Skains to order an MRI on knee. Left knee is the worse of the two. Please call patient to advise. Patient available after 4:30 pm.

## 2020-08-15 ENCOUNTER — Other Ambulatory Visit: Payer: Self-pay | Admitting: *Deleted

## 2020-08-15 DIAGNOSIS — M25562 Pain in left knee: Secondary | ICD-10-CM

## 2020-08-15 DIAGNOSIS — G8929 Other chronic pain: Secondary | ICD-10-CM

## 2020-08-16 ENCOUNTER — Telehealth: Payer: Self-pay | Admitting: Rheumatology

## 2020-08-16 NOTE — Telephone Encounter (Signed)
I spoke with Darel Hong at [facility]. Patient does not meet criteria for MRI Lt Knee. Doctor can call [phone] to do a Peer to Peer. Member # [id number]

## 2020-08-19 ENCOUNTER — Other Ambulatory Visit: Payer: Self-pay

## 2020-08-19 NOTE — Progress Notes (Signed)
HPI: [name] is a 60 y.o. female, who is here today for her routine physical, follow-up on hypertension and hyperlipidemia, and acute on chronic lower back pain.  Last CPE: > 1 year.  Regular exercise 3 or more time per week: Not consistently, exacerbates pain.Her rheumatologist recommended to avoid exercise except water aerobics. Following a healthy diet: She cooks her meals, tries to make good choices. Sundays she eats out and may have something fried.Bakes,broils,and grills. She lives with her husband. 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  Chronic medical problems: Fibromyalgia, GERD, mixed connective tissue disease, asthma, seasonal allergies, hypertension, vitamin D deficiency, and thyroid nodule among some.  Pap smear:04/2020. She sees her gyn regularly, Dr Patricia Reed.  Immunization History  Administered Date(s) Administered   Influenza Split 11/04/2015, 10/11/2017, 10/21/2018   Influenza, Quadrivalent, Recombinant, Inj, Pf 10/11/2017   Influenza,inj,Quad PF,6+ Mos 11/04/2015, 10/21/2018   PFIZER Comirnaty(Gray Top)Covid-19 Tri-Sucrose Vaccine 05/10/2020   PFIZER(Purple Top)SARS-COV-2 Vaccination 03/20/2019, 04/12/2019, 12/07/2019   Pneumococcal Polysaccharide-23 01/09/2020   Tdap 08/20/2020   Unspecified SARS-COV-2 Vaccination 03/20/2019, 04/12/2019, 12/07/2019, 05/11/2020   Zoster Recombinat (Shingrix) 10/11/2017, 12/25/2017, 01/09/2020   Mammogram: 05/15/20 Colonoscopy: 2020 - repeat in 10 years DEXA: N/A Hep C screening: 03/01/20 NR  Concerns today:  Hyperlipidemia: Currently she is on nonpharmacologic treatment. Lab Results  Component Value Date   CHOL 205 (H) 05/26/2018   HDL 62 05/26/2018   LDLCALC 121 (H) 05/26/2018   TRIG 111 05/26/2018   CHOLHDL 3.3 05/26/2018   Hypertension: Currently she is on HCTZ 25 mg daily. Tolerated medication well. Negative for frequent/severe headache,CP,SOB,or focal  weakness.  Hypokalemia on K-Lor 20 mEq daily.  Lab Results  Component Value Date   CREATININE 0.78 06/24/2020   BUN 16 06/24/2020   NA 142 06/24/2020   K 3.7 06/24/2020   CL 102 06/24/2020   CO2 30 06/24/2020   "Sciatic flare up", severe lower back pain, she "can barely walk, I am in a lot of pain." This started again 5 days ago, it has happened intermittently for years. No unusual physical activity or trauma. Constant right lower back sharp pain, radiated to RLE.  Associated RLE numbness.  Pain is "11/10" Negative for associated fever, chills,saddle anesthesia,or bowel/bladder dysfunction.  She has been taking Tylenol arthritis. Pain interferes with sleep.  Mixed connective tissue disease: She is on chronic Prednisone 5 mg 1/2 tab daily.  Review of Systems  Constitutional:  Positive for fatigue. Negative for appetite change and fever.  HENT:  Negative for hearing loss, mouth sores and sore throat.   Eyes:  Negative for pain and visual disturbance.  Respiratory:  Negative for cough and wheezing.   Cardiovascular:  Negative for palpitations and leg swelling.  Gastrointestinal:  Negative for abdominal pain, nausea and vomiting.       No changes in bowel habits.  Endocrine: Negative for cold intolerance, heat intolerance, polydipsia, polyphagia and polyuria.  Genitourinary:  Negative for decreased urine volume, dysuria, hematuria, vaginal bleeding and vaginal discharge.  Musculoskeletal:  Positive for arthralgias and myalgias. Negative for gait problem and neck pain.  Skin:  Negative for color change and rash.  Allergic/Immunologic: Positive for environmental allergies.  Neurological:  Negative for syncope, facial asymmetry and weakness.  Hematological:  Negative for adenopathy. Does not bruise/bleed easily.  Psychiatric/Behavioral:  Negative for confusion.   All other systems reviewed and are negative.  Current Outpatient Medications on File Prior to Visit  Medication Sig  Dispense Refill   acetaminophen (TYLENOL) 650 MG CR tablet Take 650 mg by mouth every 8 (eight) hours as needed for pain.     albuterol (PROVENTIL) (2.5 MG/3ML) 0.083% nebulizer solution Take 3 mLs (2.5 mg total) by nebulization every 6 (six) hours as needed for wheezing or shortness of breath. 75 mL 12   Ascorbic Acid (VITAMIN C PO) Take by mouth.     B-D INS SYR ULTRAFINE 1CC/30G 30G X 1/2" 1 ML MISC      baclofen (LIORESAL) 10 MG tablet TAKE 1 TABLET BY MOUTH AT BEDTIME AS NEEDED FOR MUSCLE SPASMS 90 tablet 1   CALCIUM PO Take by mouth daily.     Cholecalciferol 25 MCG (1000 UT) capsule Take by mouth.     diclofenac sodium (VOLTAREN) 1 % GEL Apply 3 g to 3 large joints up to 3 times daily. (Patient taking differently: Apply 2 g topically daily as needed (pain).) 3 Tube 3   Estradiol 10 MCG TABS vaginal tablet Yuvafem 10 mcg vaginal tablet     famotidine (PEPCID) 20 MG tablet TAKE 1 TO 2 TABLETS BY MOUTH AT BEDTIME 180 tablet 1   fluticasone furoate-vilanterol (BREO ELLIPTA) 100-25 MCG/INH AEPB Inhale 1 puff into the lungs daily. 28 each 0   folic acid (FOLVITE) 1 MG tablet TAKE 2 TABLETS (2 MG TOTAL) BY MOUTH DAILY. (Patient taking differently: Take 1 mg by mouth 2 (two) times daily.) 180 tablet 3   hydrochlorothiazide (HYDRODIURIL) 25 MG tablet TAKE 1 TABLET BY MOUTH EVERY DAY 90 tablet 0   KLOR-CON M20 20 MEQ tablet TAKE 1 TABLET BY MOUTH EVERY DAY 90 tablet 0   Methotrexate Sodium (METHOTREXATE, PF,) 250 MG/10ML injection INJECT 0.6 MLS (15 MG TOTAL) INTO THE SKIN ONCE A WEEK. 8 mL 2   nystatin-triamcinolone (MYCOLOG II) cream Apply 1 application topically 2 (two) times daily. 60 g 0   Omega-3 Fatty Acids (FISH OIL) 1000 MG CAPS Take by mouth.     pantoprazole (PROTONIX) 40 MG tablet TAKE 1 TABLET BY MOUTH TWICE A DAY 60 tablet 0   predniSONE (DELTASONE) 5 MG tablet Take 2.5 mg by mouth daily.      Tuberculin-Allergy Syringes (ALLERGY SYRINGE 1CC/27GX1/2") 27G X 1/2" 1 ML MISC Patient to  use to inject MTX weekly 12 each 3   valACYclovir (VALTREX) 500 MG tablet TAKE 1 TABLET (500 MG TOTAL) BY MOUTH 2 (TWO) TIMES DAILY. FOR 3 DAYS WITH ACUTE EPISODES. (Patient taking differently: TAKE 1 TABLET (500 MG TOTAL) BY MOUTH 2 (TWO) TIMES DAILY.) 18 tablet 1   vitamin B-12 (CYANOCOBALAMIN) 100 MCG tablet Take 100 mcg by mouth daily.     VITAMIN D, ERGOCALCIFEROL, PO Take 1 capsule by mouth daily.     XELJANZ XR 11 MG TB24 Take 1 tablet by mouth daily.     Zinc Citrate-Phytase (ZYTAZE) 25-500 MG CAPS Take by mouth.     No current facility-administered medications on file prior to visit.   Past Medical History:  Diagnosis Date   Allergy    Anemia    Asthma    Blood transfusion without reported diagnosis    Fibromyalgia    GERD (gastroesophageal reflux disease)    Heart murmur    High cholesterol    History of stomach ulcers    Hypertension    Lupus (HCC)    MI, old    Rheumatoid arthritis (HCC)    Thyroid disease    Thyroid mass    Vertigo  Past Surgical History:  Procedure Laterality Date   ABDOMINAL HYSTERECTOMY     done for menorrhagia, ovaries remain   CERVICAL DISCECTOMY     plates and screws in neck   CESAREAN SECTION     COLONOSCOPY     ESOPHAGEAL MANOMETRY N/A 02/12/2014   Procedure: ESOPHAGEAL MANOMETRY (EM);  Surgeon: Louis Meckel, MD;  Location: [facility];  Service: Endoscopy;  Laterality: N/A;   ESOPHAGOGASTRODUODENOSCOPY  03/04/2011   Procedure: ESOPHAGOGASTRODUODENOSCOPY (EGD);  Surgeon: Louis Meckel, MD;  Location: [facility];  Service: Endoscopy;  Laterality: N/A;  BOTOX Injection   ESOPHAGOGASTRODUODENOSCOPY (EGD) WITH PROPOFOL N/A 03/12/2019   Procedure: ESOPHAGOGASTRODUODENOSCOPY (EGD) WITH PROPOFOL;  Surgeon: Jeani Hawking, MD;  Location: [facility] ENDOSCOPY;  Service: Endoscopy;  Laterality: N/A;   exc benign breast lump     TONSILLECTOMY     UPPER GASTROINTESTINAL ENDOSCOPY     Allergies  Allergen Reactions   Aspirin Other (See Comments)     GI bleed   Demerol [Meperidine]    Hydrocodone-Acetaminophen Hives and Nausea And Vomiting   Ibuprofen Other (See Comments)    GI bleed   Imdur [Isosorbide Nitrate] Other (See Comments)    Reaction unknown   Isosorbide Dinitrate     Other reaction(s): Other (see comments) Other reaction(s): Other (See Comments) Reaction unknown Reaction unknown   Meperidine Hcl Hives and Nausea And Vomiting    Short term memory loss   Morphine Hives and Nausea And Vomiting   Oxycodone-Acetaminophen Hives and Nausea And Vomiting    Reaction unknown   Procaine Hcl Other (See Comments)    Ineffective   Toprol Xl [Metoprolol] Other (See Comments)    Reaction unknown   Tramadol Itching   Codeine Nausea Only    Other reaction(s): Other (see comments)    Family History  Problem Relation Age of Onset   Alcohol abuse Father    Hypertension Father    Heart disease Father    Other Mother        tobacco use disorder, refuses to see a doctor   Osteoporosis Sister    Colon cancer Maternal Uncle    Diabetes Maternal Grandmother    Healthy Son    Healthy Daughter    Multiple sclerosis Daughter    Colon polyps Neg Hx    Esophageal cancer Neg Hx    Kidney disease Neg Hx    Stomach cancer Neg Hx    Rectal cancer Neg Hx    Social History   Socioeconomic History   Marital status: Married    Spouse name: Not on file   Number of children: 4   Years of education: Not on file   Highest education level: Not on file  Occupational History   Occupation: Geologist, engineering    Employer: [employer]  Tobacco Use   Smoking status: Never   Smokeless tobacco: Never  Vaping Use   Vaping Use: Never used  Substance and Sexual Activity   Alcohol use: Not Currently    Alcohol/week: 0.0 standard drinks    Comment: occ   Drug use: No   Sexual activity: Yes    Birth control/protection: None, Surgical    Comment: LAVH  Other Topics Concern   Not on file  Social History Narrative   First  grade Geologist, 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     Social Determinants of Health   Financial Resource Strain: Not on file  Food Insecurity: Not on file  Transportation Needs:  Not on file  Physical Activity: Not on file  Stress: Not on file  Social Connections: Not on file   Vitals:   08/20/20 0658  BP: 128/70  Pulse: 72  Resp: 16  Temp: 97.6 F (36.4 C)   Body mass index is 34.95 kg/m.  Wt Readings from Last 3 Encounters:  08/20/20 210 lb (95.3 kg)  07/17/20 209 lb (94.8 kg)  06/24/20 209 lb 9.6 oz (95.1 kg)   Physical Exam Vitals and nursing note reviewed.  Constitutional:      General: She is not in acute distress.    Appearance: She is well-developed.  HENT:     Head: Normocephalic and atraumatic.     Right Ear: Hearing, tympanic membrane, ear canal and external ear normal.     Left Ear: Hearing, tympanic membrane, ear canal and external ear normal.     Mouth/Throat:     Mouth: Mucous membranes are moist.     Pharynx: Oropharynx is clear. Uvula midline.  Eyes:     Extraocular Movements: Extraocular movements intact.     Conjunctiva/sclera: Conjunctivae normal.     Pupils: Pupils are equal, round, and reactive to light.     Comments: Small pinguecula, bilateral.  Neck:     Thyroid: Thyromegaly (Right nodule.) present. No thyroid tenderness.     Trachea: No tracheal deviation.  Cardiovascular:     Rate and Rhythm: Normal rate and regular rhythm.     Pulses:          Dorsalis pedis pulses are 2+ on the right side and 2+ on the left side.     Heart sounds: No murmur heard. Pulmonary:     Effort: Pulmonary effort is normal. No respiratory distress.     Breath sounds: Normal breath sounds.  Chest:  Breasts:    Right: No supraclavicular adenopathy.     Left: No supraclavicular adenopathy.  Abdominal:     Palpations: Abdomen is soft. There is no hepatomegaly or mass.     Tenderness: There is no abdominal tenderness.  Genitourinary:     Comments: Deferred to gyn. Musculoskeletal:     Lumbar back: Tenderness present.       Back:     Comments: No major deformity or signs of synovitis appreciated.  Lymphadenopathy:     Cervical: No cervical adenopathy.     Upper Body:     Right upper body: No supraclavicular adenopathy.     Left upper body: No supraclavicular adenopathy.  Skin:    General: Skin is warm.     Findings: No erythema or rash.  Neurological:     General: No focal deficit present.     Mental Status: She is alert and oriented to person, place, and time.     Cranial Nerves: No cranial nerve deficit.     Coordination: Coordination normal.     Gait: Gait normal.     Deep Tendon Reflexes:     Reflex Scores:      Bicep reflexes are 2+ on the right side and 2+ on the left side.      Patellar reflexes are 2+ on the right side and 2+ on the left side.    Comments: Antalgic gait, not assisted.  Psychiatric:        Speech: Speech normal.     Comments: Well groomed, good eye contact.   ASSESSMENT AND PLAN:  [name] was here today annual physical examination.  Orders Placed This Encounter  Procedures  US THYROID   Tdap vaccine greater than or equal to 7yo IM   Lipid panel   Hemoglobin A1c   TSH   Lab Results  Component Value Date   TSH 0.16 (L) 08/20/2020   Lab Results  Component Value Date   HGBA1C 6.2 08/20/2020   Lab Results  Component Value Date   CHOL 243 (H) 08/20/2020   HDL 67.60 08/20/2020   LDLCALC 164 (H) 08/20/2020   TRIG 60.0 08/20/2020   CHOLHDL 4 08/20/2020   Routine general medical examination at a health care facility We discussed the importance of regular physical activity and healthy diet for prevention of chronic illness and/or complications. Preventive guidelines reviewed. Continue with gynecologist for her female preventive care. Vaccination up-to-date. Ca++ and vit D supplementation recommended. Next CPE in a year.  The 10-year ASCVD risk score Denman George DC  Montez Hageman., et al., 2013) is: 6.2%   Values used to calculate the score:     Age: 52 years     Sex: Female     Is Non-Hispanic African American: Yes     Diabetic: No     Tobacco smoker: No     Systolic Blood Pressure: 128 mmHg     Is BP treated: Yes     HDL Cholesterol: 67.6 mg/dL     Total Cholesterol: 243 mg/dL  Sickle cell trait (HCC) CBC and BMP done regularly at her rheumatologist office.  Class 1 obesity with serious comorbidity and body mass index (BMI) of 34.0 to 34.9 in adult, unspecified obesity type Weight has been stable. We discussed benefits of wt loss as well as adverse effects of obesity. Consistency with healthy diet and physical activity recommended.  Hyperlipidemia, unspecified hyperlipidemia type Non pharmacologic treatment recommended for now. Further recommendations will be given according to 10 years CVD risk score and lipid panel numbers.  Screening for endocrine, metabolic and immunity disorder -     Hemoglobin A1c  Chronic right-sided low back pain with right-sided sciatica With acute exacerbation. We discussed treatment options, she agrees with prednisone taper. Discussed side effects. Instructed to take medication with food. If radiation to lower extremity is not resolved in 4 to 6 weeks, she is going to need Ortho evaluation and/or lumbar MRI.  -     predniSONE (DELTASONE) 20 MG tablet; 3 tabs for 3 days, 2 tabs for 3 days, 1 tabs for 3 days, and 1/2 tab for 3 days. Take tables together with breakfast.  Need for Tdap vaccination -     Tdap vaccine greater than or equal to 7yo IM  Thyroid nodule - benign Problem seems to be stable. Thyroid US will be arranged. Further recommendation will be given according to imaging and TSH results.  Primary hypertension BP adequately controlled. Continue current management: HCTZ 25 mg daily. DASH/low salt diet to continue. Monitor BP at home. Eye exam recommended annually.  Return in 1 year (on  08/20/2021).  Trayce Maino G. [place], MD  [facility]. [place] office.

## 2020-08-20 ENCOUNTER — Ambulatory Visit (INDEPENDENT_AMBULATORY_CARE_PROVIDER_SITE_OTHER): Payer: BC Managed Care – PPO | Admitting: Family Medicine

## 2020-08-20 ENCOUNTER — Encounter: Payer: Self-pay | Admitting: Family Medicine

## 2020-08-20 VITALS — BP 128/70 | HR 72 | Temp 97.6°F | Resp 16 | Ht 65.0 in | Wt 210.0 lb

## 2020-08-20 DIAGNOSIS — M5441 Lumbago with sciatica, right side: Secondary | ICD-10-CM

## 2020-08-20 DIAGNOSIS — E669 Obesity, unspecified: Secondary | ICD-10-CM | POA: Diagnosis not present

## 2020-08-20 DIAGNOSIS — Z0000 Encounter for general adult medical examination without abnormal findings: Secondary | ICD-10-CM | POA: Diagnosis not present

## 2020-08-20 DIAGNOSIS — G8929 Other chronic pain: Secondary | ICD-10-CM

## 2020-08-20 DIAGNOSIS — D573 Sickle-cell trait: Secondary | ICD-10-CM

## 2020-08-20 DIAGNOSIS — Z13228 Encounter for screening for other metabolic disorders: Secondary | ICD-10-CM | POA: Diagnosis not present

## 2020-08-20 DIAGNOSIS — R7989 Other specified abnormal findings of blood chemistry: Secondary | ICD-10-CM

## 2020-08-20 DIAGNOSIS — Z1329 Encounter for screening for other suspected endocrine disorder: Secondary | ICD-10-CM | POA: Diagnosis not present

## 2020-08-20 DIAGNOSIS — Z23 Encounter for immunization: Secondary | ICD-10-CM | POA: Diagnosis not present

## 2020-08-20 DIAGNOSIS — E041 Nontoxic single thyroid nodule: Secondary | ICD-10-CM | POA: Diagnosis not present

## 2020-08-20 DIAGNOSIS — E785 Hyperlipidemia, unspecified: Secondary | ICD-10-CM

## 2020-08-20 DIAGNOSIS — Z130 Encounter for screening for diseases of the blood and blood-forming organs and certain disorders involving the immune mechanism: Secondary | ICD-10-CM | POA: Diagnosis not present

## 2020-08-20 DIAGNOSIS — I10 Essential (primary) hypertension: Secondary | ICD-10-CM

## 2020-08-20 DIAGNOSIS — Z6834 Body mass index (BMI) 34.0-34.9, adult: Secondary | ICD-10-CM

## 2020-08-20 LAB — LIPID PANEL
Cholesterol: 243 mg/dL — ABNORMAL HIGH (ref 0–200)
HDL: 67.6 mg/dL (ref >39.00)
LDL Cholesterol: 164 mg/dL — ABNORMAL HIGH (ref 0–99)
NonHDL: 175.57
Total CHOL/HDL Ratio: 4
Triglycerides: 60 mg/dL (ref 0.0–149.0)
VLDL: 12 mg/dL (ref 0.0–40.0)

## 2020-08-20 LAB — HEMOGLOBIN A1C: Hgb A1c MFr Bld: 6.2 % (ref 4.6–6.5)

## 2020-08-20 LAB — TSH: TSH: 0.16 u[IU]/mL — ABNORMAL LOW (ref 0.35–5.50)

## 2020-08-20 MED ORDER — PREDNISONE 20 MG PO TABS: ORAL_TABLET | ORAL | 0 refills | Status: AC

## 2020-08-20 NOTE — Patient Instructions (Addendum)
Today you have you routine preventive visit. A few things to remember from today's visit:   Routine general medical examination at a health care facility  Essential hypertension  Sickle cell trait (HCC), Chronic  Class 2 severe obesity due to excess calories with serious comorbidity and body mass index (BMI) of 35.0 to 35.9 in adult [facility]), Chronic  Hyperlipidemia, unspecified hyperlipidemia type - Plan: Lipid panel  Screening for endocrine, metabolic and immunity disorder - Plan: Hemoglobin A1c  Chronic right-sided low back pain with right-sided sciatica - Plan: predniSONE (DELTASONE) 20 MG tablet  If you need refills please call your pharmacy. Do not use My Chart to request refills or for acute issues that need immediate attention. If right leg numbness is not resolved in 4-6 weeks we will need ortho evaluation and/or lumbar MRI. Take prednisone with breakfast.   Please be sure medication list is accurate. If a new problem present, please set up appointment sooner than planned today.  At least 150 minutes of moderate exercise per week, daily brisk walking for 15-30 min is a good exercise option. Healthy diet low in saturated (animal) fats and sweets and consisting of fresh fruits and vegetables, lean meats such as fish and white chicken and whole grains.  These are some of recommendations for screening depending of age and risk factors:  - Vaccines:  Tdap vaccine every 10 years.  Shingles vaccine recommended at age 24, could be given after 58 years of age but not sure about insurance coverage.   Pneumonia vaccines: Pneumovax at 65. Sometimes Pneumovax is giving earlier if history of smoking, lung disease,diabetes,kidney disease among some.  Screening for diabetes at age 69 and every 3 years.  Cervical cancer prevention:  Pap smear starts at 58 years of age and continues periodically until 58 years old in low risk women. Pap smear every 3 years between 39 and 42 years  old. Pap smear every 3-5 years between women 30 and older if pap smear negative and HPV screening negative.  -Breast cancer: Mammogram: There is disagreement between experts about when to start screening in low risk asymptomatic female but recent recommendations are to start screening at 35 and not later than 58 years old , every 1-2 years and after 58 yo q 2 years. Screening is recommended until 58 years old but some women can continue screening depending of healthy issues.  Colon cancer screening: Has been recently changed to 58 yo. Insurance may not cover until you are 58 years old. Screening is recommended until 58 years old.  Cholesterol disorder screening at age 27 and every 3 years.N/A  Also recommended:  Dental visit- Brush and floss your teeth twice daily; visit your dentist twice a year. Eye doctor- Get an eye exam at least every 2 years. Helmet use- Always wear a helmet when riding a bicycle, motorcycle, rollerblading or skateboarding. Safe sex- If you may be exposed to sexually transmitted infections, use a condom. Seat belts- Seat belts can save your live; always wear one. Smoke/Carbon Monoxide detectors- These detectors need to be installed on the appropriate level of your home. Replace batteries at least once a year. Skin cancer- When out in the sun please cover up and use sunscreen 15 SPF or higher. Violence- If anyone is threatening or hurting you, please tell your healthcare provider.  Drink alcohol in moderation- Limit alcohol intake to one drink or less per day. Never drink and drive. Calcium supplementation 1000 to 1200 mg daily, ideally through your diet.  Vitamin  D supplementation 800 units daily.

## 2020-08-22 NOTE — Telephone Encounter (Signed)
Clinicals faxed to [facility] 08/21/2020. Per [facility], MRI left knee w/o contrast ([id number]) has been authorized. Authorization # [id number]. Effective 08/21/20.

## 2020-08-26 ENCOUNTER — Other Ambulatory Visit: Payer: Self-pay

## 2020-08-26 MED ORDER — LOVASTATIN 20 MG PO TABS: 20.0000 mg | ORAL_TABLET | Freq: Every day | ORAL | 3 refills | Status: DC

## 2020-09-07 ENCOUNTER — Other Ambulatory Visit: Payer: Self-pay

## 2020-09-07 ENCOUNTER — Ambulatory Visit (HOSPITAL_BASED_OUTPATIENT_CLINIC_OR_DEPARTMENT_OTHER)
Admission: RE | Admit: 2020-09-07 | Discharge: 2020-09-07 | Disposition: A | Discharge status: Home / Self Care | Payer: BC Managed Care – PPO | Source: Ambulatory Visit | Attending: Rheumatology | Admitting: Rheumatology

## 2020-09-07 DIAGNOSIS — M25562 Pain in left knee: Secondary | ICD-10-CM | POA: Diagnosis present

## 2020-09-07 DIAGNOSIS — G8929 Other chronic pain: Secondary | ICD-10-CM | POA: Diagnosis present

## 2020-09-10 NOTE — Progress Notes (Signed)
Please notify patient that there was no evidence of injury or inflammation on the MRI.  Mild osteoarthritis was noted.

## 2020-09-11 ENCOUNTER — Telehealth: Payer: Self-pay | Admitting: *Deleted

## 2020-09-11 DIAGNOSIS — M25561 Pain in right knee: Secondary | ICD-10-CM

## 2020-09-11 NOTE — Addendum Note (Signed)
Addended by: Henriette Combs on: 09/11/2020 12:56 PM   Modules accepted: Orders

## 2020-09-11 NOTE — Telephone Encounter (Signed)
Spoke with patient and advised her of MRI results. Patient states she is having severe pain in her knees. Patient states the pain is worse in her right knee than left now. Patient would like to know what she can do to help with the pain. Patient states she has been using Tylenol Arthritis with little relief.  Patient is also using Voltaren Gel with little relief. Please advise.

## 2020-09-11 NOTE — Telephone Encounter (Signed)
Patient advised we will make a referral to the orthopedics for evaluation.  Patient advised Dr. Corliss Skains  did not see any inflammation on the MRI. Referral placed.

## 2020-09-11 NOTE — Telephone Encounter (Signed)
Please make a referral to the orthopedics for evaluation.  I did not see any inflammation on the MRI.

## 2020-09-12 ENCOUNTER — Telehealth: Payer: Self-pay | Admitting: *Deleted

## 2020-09-12 NOTE — Telephone Encounter (Signed)
She may try hydrocortisone cream over-the-counter.  If the rash is not better then she should follow-up with her PCP or a dermatologist for evaluation.

## 2020-09-12 NOTE — Telephone Encounter (Signed)
Attempted to contact the patient and left message for patient to call the office.  

## 2020-09-12 NOTE — Telephone Encounter (Signed)
Patient's nose is burning and itching in both nostrils, rash, skin is turning dark x 1 week, patient requesting cream, [facility], [place], [place], patient contact [phone].

## 2020-09-12 NOTE — Telephone Encounter (Signed)
Patient's nose is burning and itching on the outside of both nostrils, rash, skin is turning dark x 1 week, patient requesting cream. Patient states the rash goes across her nose. Patient has been putting cocoa butter on it with no relief. Patient denies excessive sun exposure. Please advise.

## 2020-09-13 ENCOUNTER — Other Ambulatory Visit: Payer: Self-pay | Admitting: Family Medicine

## 2020-09-13 DIAGNOSIS — I10 Essential (primary) hypertension: Secondary | ICD-10-CM

## 2020-09-13 NOTE — Telephone Encounter (Signed)
Patient advised may try hydrocortisone cream over-the-counter.  If the rash is not better then she should follow-up with her PCP or a dermatologist for evaluation.

## 2020-09-17 ENCOUNTER — Ambulatory Visit (INDEPENDENT_AMBULATORY_CARE_PROVIDER_SITE_OTHER): Payer: BC Managed Care – PPO | Admitting: Physician Assistant

## 2020-09-17 ENCOUNTER — Other Ambulatory Visit: Payer: Self-pay

## 2020-09-17 DIAGNOSIS — M25562 Pain in left knee: Secondary | ICD-10-CM

## 2020-09-17 DIAGNOSIS — M25561 Pain in right knee: Secondary | ICD-10-CM | POA: Diagnosis not present

## 2020-09-17 DIAGNOSIS — M1389 Other specified arthritis, multiple sites: Secondary | ICD-10-CM

## 2020-09-17 MED ORDER — LIDOCAINE HCL 1 % IJ SOLN
5.0000 mL | INTRAMUSCULAR | Status: AC | PRN
  Administered 2020-09-17: 5 mL

## 2020-09-17 MED ORDER — METHYLPREDNISOLONE ACETATE 40 MG/ML IJ SUSP
40.0000 mg | INTRAMUSCULAR | Status: AC | PRN
  Administered 2020-09-17: 40 mg via INTRA_ARTICULAR

## 2020-09-17 MED ORDER — METHYLPREDNISOLONE ACETATE 40 MG/ML IJ SUSP
40.0000 mg | INTRAMUSCULAR | Status: AC | PRN
Start: 2020-09-17 — End: 2020-09-17
  Administered 2020-09-17: 40 mg via INTRA_ARTICULAR

## 2020-09-17 NOTE — Progress Notes (Signed)
Office Visit Note   [name]           Date of Birth: 1960-03-12           MRN: [id number] Visit Date: 09/17/2020              Requested by: Pollyann Savoy, MD [address] [place],  [place] [address] PCP: [place], [name], MD  Chief Complaint  Patient presents with  . Right Knee - Pain  . Left Knee - Pain      HPI: Patient is a pleasant 60 year old woman with a history of rheumatoid arthritis who presents today with bilateral left greater than right knee pain.  She uses topical anti-inflammatory which does not help her that much.  She also uses ibuprofen.  She also has a history of fibromyalgia and what she calls lupus.  She takes small low-dose prednisone provided by her rheumatologist.  She focuses her knee pain globally over both knees.  Assessment & Plan: Visit Diagnoses: No diagnosis found.  Plan: We will go forward with steroid injections today and reevaluate in 3 weeks.  She may be a candidate for viscosupplementation.  Follow-Up Instructions: No follow-ups on file.   Ortho Exam  Patient is alert, oriented, no adenopathy, well-dressed, normal affect, normal respiratory effort. Bilateral knees no effusion no cellulitis she has crepitus and pain with range of motion especially with extension bilaterally mostly painful over the medial compartments.  X-rays and MRI reviewed to show early degenerative changes of the medial compartments in the patellofemoral joints.  Imaging: No results found. No images are attached to the encounter.  Labs: Lab Results  Component Value Date   HGBA1C 6.2 08/20/2020   ESRSEDRATE 17 06/24/2020   ESRSEDRATE 17 01/22/2020   ESRSEDRATE 29 10/16/2019   CRP 0.6 10/11/2015   REPTSTATUS 02/10/2013 FINAL 02/08/2013   CULT  02/08/2013    No Beta Hemolytic Streptococci Isolated Performed at [facility]     Lab Results  Component Value Date   ALBUMIN 3.9 05/27/2019   ALBUMIN 3.9 03/11/2019    ALBUMIN 4.0 09/18/2017    Lab Results  Component Value Date   MG 2.2 03/14/2019   MG 1.7 03/13/2019   Lab Results  Component Value Date   VD25OH 44 01/22/2020   VD25OH 36 03/30/2017   VD25OH 38 08/19/2016    No results found for: PREALBUMIN CBC EXTENDED Latest Ref Rng & Units 06/24/2020 04/15/2020 01/22/2020  WBC 3.8 - 10.8 Thousand/uL 4.0 5.9 4.5  RBC 3.80 - 5.10 Million/uL 4.34 4.15 4.21  HGB 11.7 - 15.5 g/dL 93.2 35.5 73.2  HCT 20.2 - 45.0 % 39.4 37.6 38.0  PLT 140 - 400 Thousand/uL 307 286 326  NEUTROABS 1,500 - 7,800 cells/uL 3,032 4,885 3,159  LYMPHSABS 850 - 3,900 cells/uL 644(L) 625(L) 909     There is no height or weight on file to calculate BMI.  Orders:  No orders of the defined types were placed in this encounter.  No orders of the defined types were placed in this encounter.    Procedures: Large Joint Inj: bilateral knee on 09/17/2020 3:00 PM Indications: pain and diagnostic evaluation Details: 22 G 1.5 in needle, anteromedial approach  Arthrogram: No  Medications (Right): 5 mL lidocaine 1 %; 40 mg methylPREDNISolone acetate 40 MG/ML Medications (Left): 5 mL lidocaine 1 %; 40 mg methylPREDNISolone acetate 40 MG/ML Outcome: tolerated well, no immediate complications Procedure, treatment alternatives, risks and benefits explained, specific risks discussed. Consent  was given by the patient.     Clinical Data: No additional findings.  ROS:  All other systems negative, except as noted in the HPI. Review of Systems  Objective: Vital Signs: There were no vitals taken for this visit.  Specialty Comments:  No specialty comments available.  PMFS History: Patient Active Problem List   Diagnosis Date Noted  . Hypokalemia 07/07/2019  . Precordial chest pain 05/04/2019  . Educated about COVID-19 virus infection 05/04/2019  . Persistent vomiting   . Abdominal pain, epigastric   . Nausea & vomiting 03/11/2019  . Gastritis   . Nausea without vomiting  02/28/2019  . Class 2 obesity with body mass index (BMI) of 37.0 to 37.9 in adult 11/25/2018  . Mixed connective tissue disease (HCC) 08/14/2017  . Recurrent genital herpes 05/12/2017  . Shortness of breath 04/15/2017  . Dizziness 02/04/2017  . Situational anxiety 06/08/2016  . Atypical chest pain 06/08/2016  . Trapezius muscle spasm 04/23/2016  . Fibromyalgia 02/13/2016  . 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 Chronic fatigue 02/13/2016  . Vitamin D deficiency 11/25/2015  . Autoimmune disease (HCC) 11/23/2015  . High risk medication use 11/23/2015  . Colon cancer screening 01/25/2014  . Anterior neck pain 12/01/2013  . Inflammatory arthritis 08/18/2012  . Allergic rhinitis 06/23/2012  . HTN (hypertension) 06/23/2012  . Hyperlipidemia 06/23/2012  . Thyroid nodule - benign 06/23/2012  . Arthralgia 06/23/2012  . Mild intermittent asthma   . Sickle cell trait (HCC)   . History of stomach ulcers   . ESOPHAGEAL STRICTURE 10/04/2007  . GERD 10/04/2007  . 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 Dysphagia, pharyngoesophageal phase 10/04/2007   Past Medical History:  Diagnosis Date  . Allergy   . Anemia   . Asthma   . Blood transfusion without reported diagnosis   . Fibromyalgia   . GERD (gastroesophageal reflux disease)   . Heart murmur   . High cholesterol   . History of stomach ulcers   . Hypertension   . Lupus (HCC)   . MI, old   . Rheumatoid arthritis (HCC)   . Thyroid disease   . Thyroid mass   . Vertigo     Family History  Problem Relation Age of Onset  . Alcohol abuse Father   . Hypertension Father   . Heart disease Father   . Other Mother        tobacco use disorder, refuses to see a doctor  . Osteoporosis Sister   . Colon cancer Maternal Uncle   . Diabetes Maternal Grandmother   . Healthy Son   . Healthy Daughter   . Multiple sclerosis Daughter   . Colon polyps Neg Hx   . Esophageal cancer Neg Hx   . Kidney disease Neg Hx   . Stomach cancer Neg Hx   . Rectal  cancer Neg Hx     Past Surgical History:  Procedure Laterality Date  . ABDOMINAL HYSTERECTOMY     done for menorrhagia, ovaries remain  . CERVICAL DISCECTOMY     plates and screws in neck  . CESAREAN SECTION    . COLONOSCOPY    . ESOPHAGEAL MANOMETRY N/A 02/12/2014   Procedure: ESOPHAGEAL MANOMETRY (EM);  Surgeon: Louis Meckel, MD;  Location: [facility];  Service: Endoscopy;  Laterality: N/A;  . ESOPHAGOGASTRODUODENOSCOPY  03/04/2011   Procedure: ESOPHAGOGASTRODUODENOSCOPY (EGD);  Surgeon: Louis Meckel, MD;  Location: [facility];  Service: Endoscopy;  Laterality: N/A;  BOTOX Injection  . ESOPHAGOGASTRODUODENOSCOPY (EGD) WITH PROPOFOL N/A 03/12/2019  Procedure: ESOPHAGOGASTRODUODENOSCOPY (EGD) WITH PROPOFOL;  Surgeon: Jeani Hawking, MD;  Location: [facility] ENDOSCOPY;  Service: Endoscopy;  Laterality: N/A;  . exc benign breast lump    . TONSILLECTOMY    . UPPER GASTROINTESTINAL ENDOSCOPY     Social History   Occupational History  . Occupation: Lobbyist: [employer]  Tobacco Use  . Smoking status: Never  . Smokeless tobacco: Never  Vaping Use  . Vaping Use: Never used  Substance and Sexual Activity  . Alcohol use: Not Currently    Alcohol/week: 0.0 standard drinks    Comment: occ  . Drug use: No  . Sexual activity: Yes    Birth control/protection: None, Surgical    Comment: LAVH

## 2020-09-23 ENCOUNTER — Encounter: Payer: Self-pay | Admitting: Family Medicine

## 2020-09-23 IMAGING — RF DG UGI W/ HIGH DENSITY W/O KUB
15 of 21 series · 15 of 21 positions shown · non-contrast
Comparison: None.

CLINICAL DATA: Vomiting.

EXAM:
UPPER GI SERIES WITH KUB
TECHNIQUE: After obtaining a scout radiograph a routine upper GI series was
performed using thin and high density barium.
FLUOROSCOPY TIME:  Fluoroscopy Time:  1 minutes 24 seconds
Radiation Exposure Index (if provided by the fluoroscopic device): 5
Number of Acquired Spot Images: 0

[Series 1: t abdomen supine · 0.15mm/px · 1 of 1 slices shown]
[im 1/1]
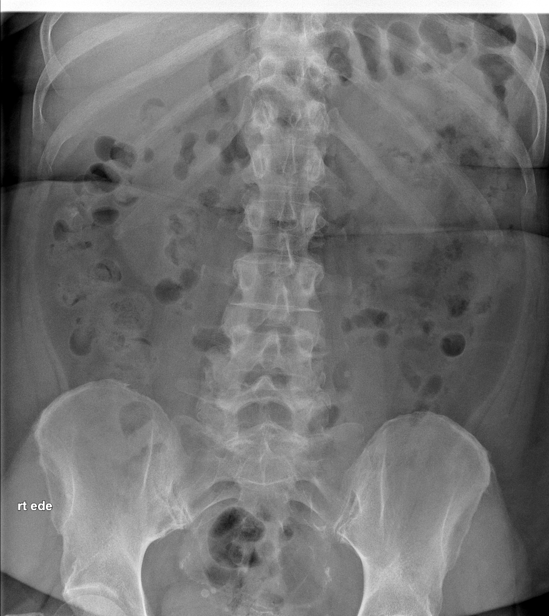

[Series 3: fluoro_barium singleshot_bw · 0.17mm/px · 1 of 1 slices shown (1 of 3)]
[im 1/1]
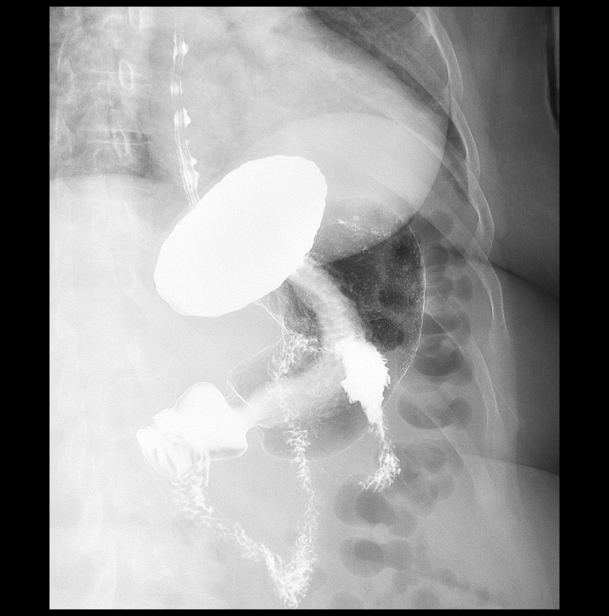

[Series 4: fluoro_barium singleshot_bw · 0.17mm/px · 1 of 1 slices shown (2 of 3)]
[im 1/1]
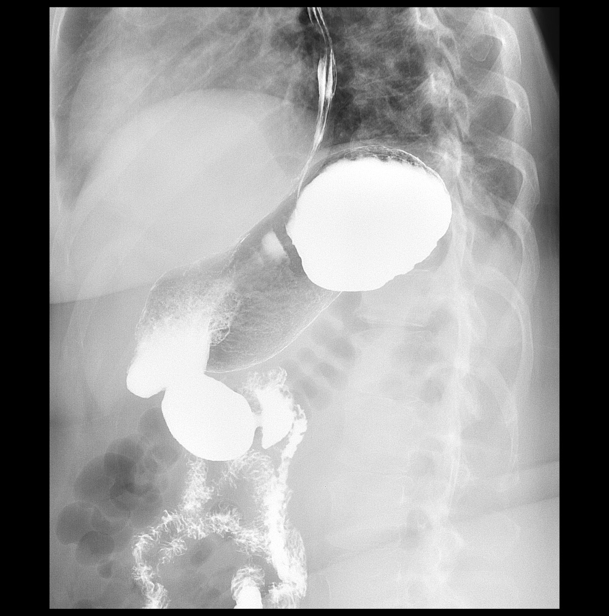

[Series 5: fluoro_barium singleshot_bw · 0.17mm/px · 1 of 1 slices shown (3 of 3)]
[im 1/1]
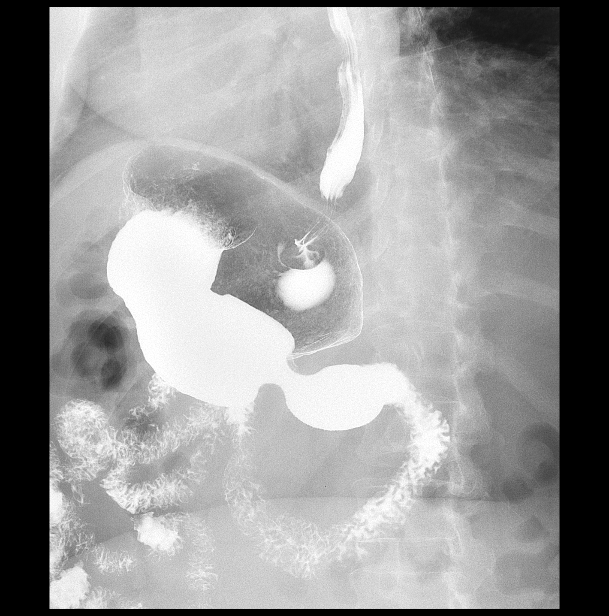

[Series 7: cp_standard · 0.27mm/px · 1 of 1 slices shown (1 of 11)]
[im 1/1]
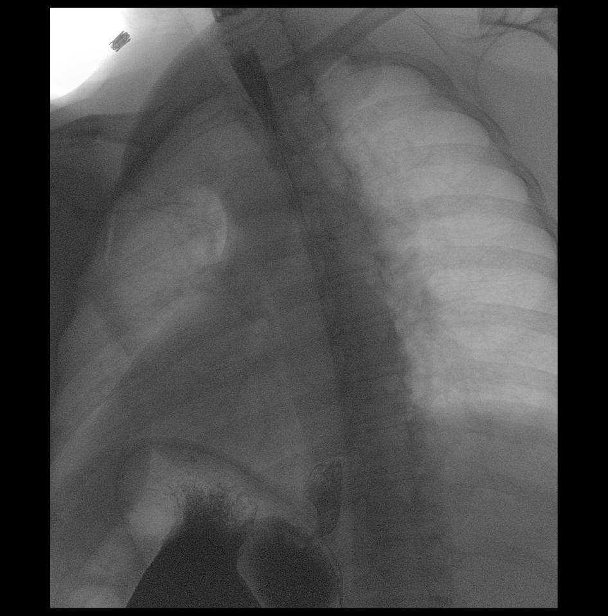

[Series 8: cp_standard · 0.27mm/px · 1 of 1 slices shown (2 of 11)]
[im 1/1]
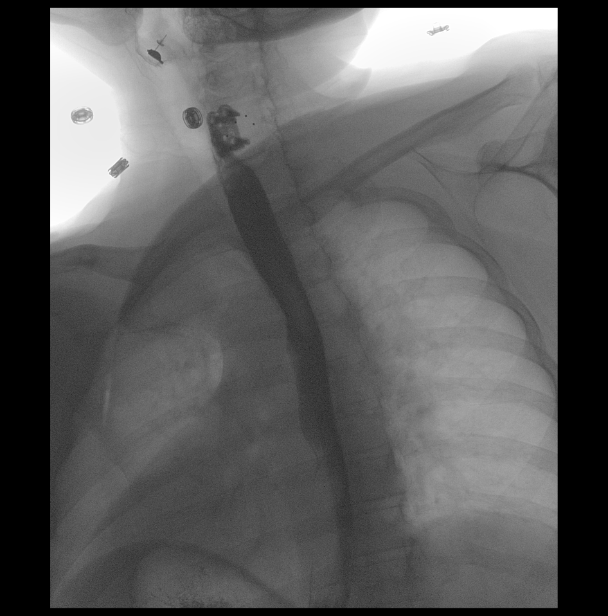

[Series 10: cp_standard · 0.27mm/px · 1 of 1 slices shown (3 of 11)]
[im 1/1]
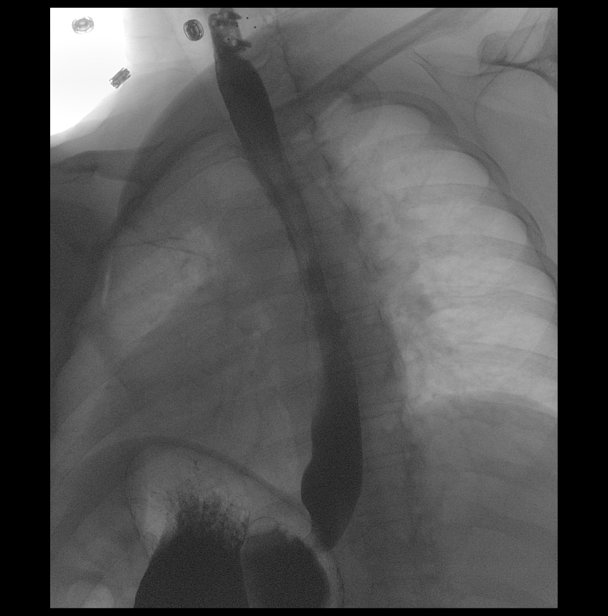

[Series 11: cp_standard · 0.27mm/px · 1 of 1 slices shown (4 of 11)]
[im 1/1]
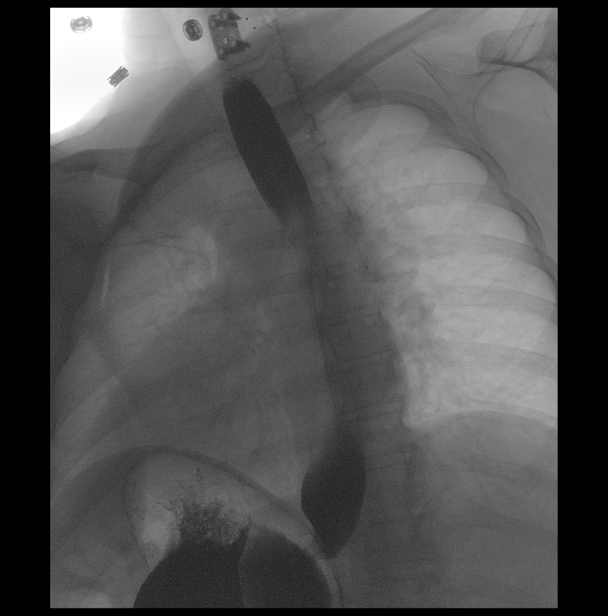

[Series 12: cp_standard · 0.27mm/px · 1 of 1 slices shown (5 of 11)]
[im 1/1]
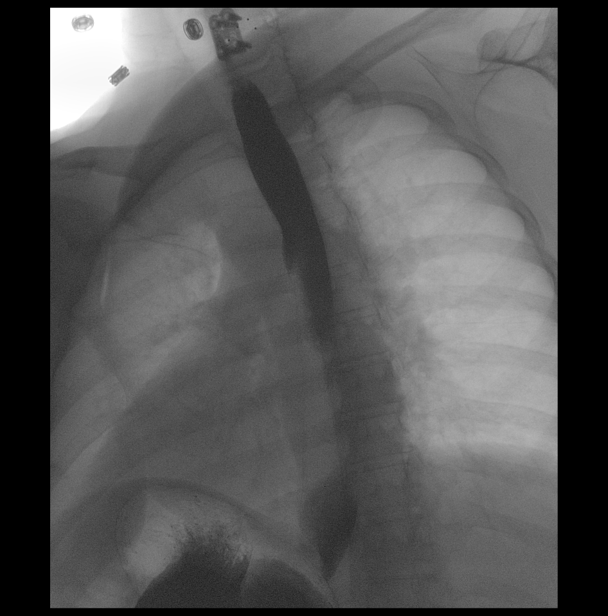

[Series 14: cp_standard · 0.27mm/px · 1 of 1 slices shown (6 of 11)]
[im 1/1]
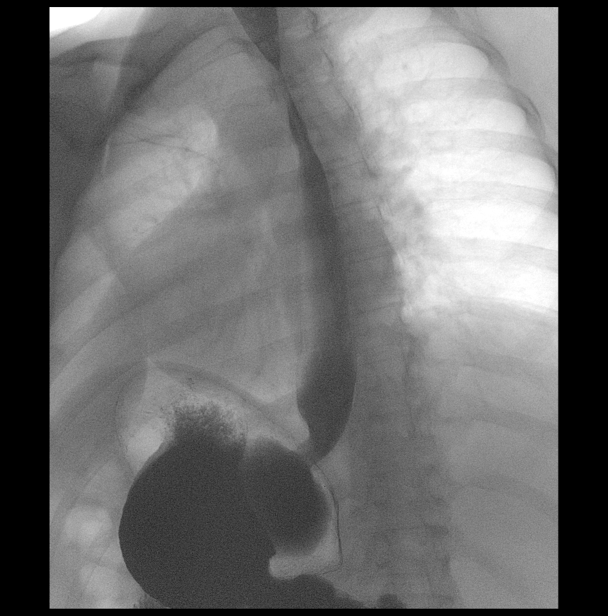

[Series 15: cp_standard · 0.27mm/px · 1 of 1 slices shown (7 of 11)]
[im 1/1]
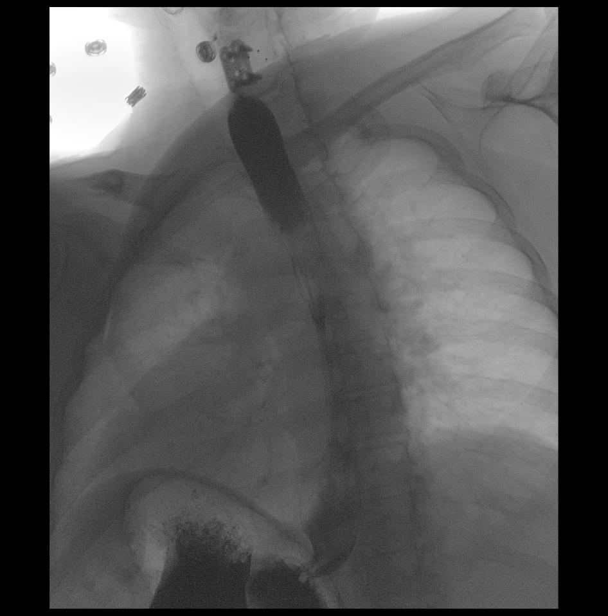

[Series 17: cp_standard · 0.17mm/px · 1 of 1 slices shown (8 of 11)]
[im 1/1]
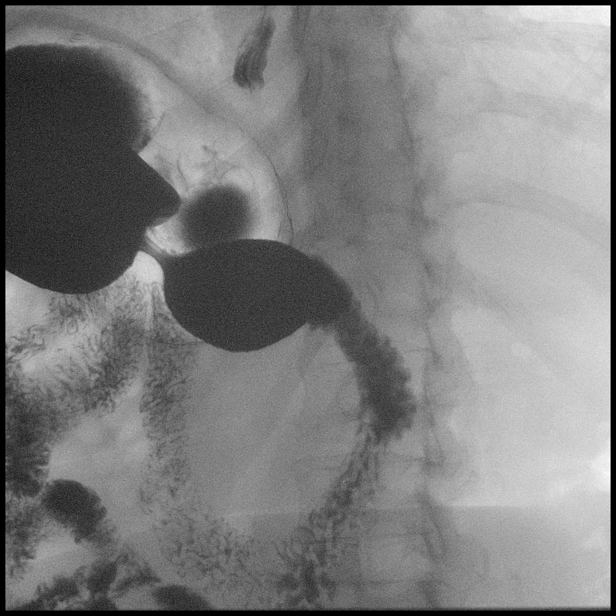

[Series 18: cp_standard · 0.17mm/px · 1 of 1 slices shown (9 of 11)]
[im 1/1]
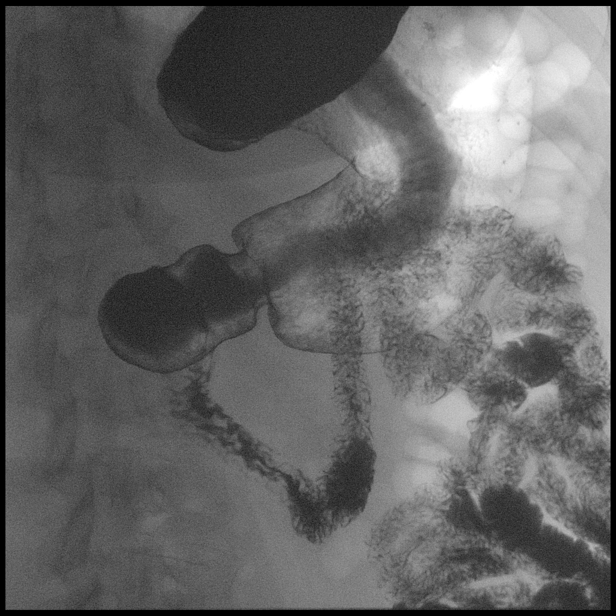

[Series 19: cp_standard · 0.18mm/px · 1 of 1 slices shown (10 of 11)]
[im 1/1]
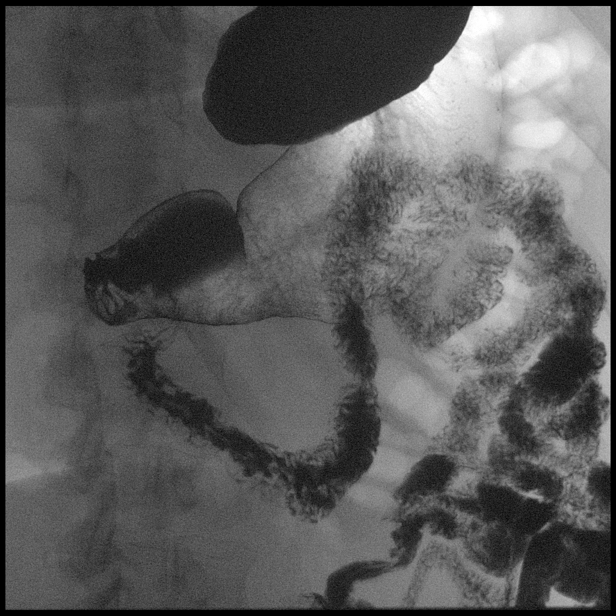

[Series 21: cp_standard · 0.26mm/px · 1 of 1 slices shown (11 of 11)]
[im 1/1]
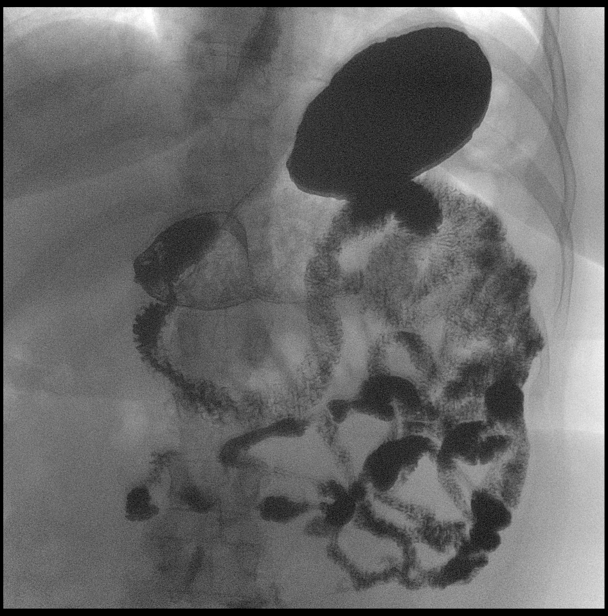

[15 of 21 positions shown; findings below may reference images not displayed]

FINDINGS: Scout view of the abdomen shows a normal bowel gas pattern. Double
contrast examination of the upper gastrointestinal tract shows
mildly sluggish esophageal motility. No esophageal fold thickening,
stricture or obstruction. Stomach is unremarkable. Duodenal bulb is
largely obscured by the distal stomach, limiting evaluation.
IMPRESSION: 1. Mildly sluggish esophageal motility.
2. Examination duodenum is limited.  Otherwise unremarkable exam.

## 2020-09-24 NOTE — Telephone Encounter (Signed)
Pt sees ortho & rheumatology.

## 2020-09-27 ENCOUNTER — Other Ambulatory Visit: Payer: Self-pay | Admitting: Family Medicine

## 2020-10-08 ENCOUNTER — Encounter: Payer: Self-pay | Admitting: Orthopedic Surgery

## 2020-10-08 ENCOUNTER — Other Ambulatory Visit: Payer: Self-pay | Admitting: *Deleted

## 2020-10-08 ENCOUNTER — Ambulatory Visit (INDEPENDENT_AMBULATORY_CARE_PROVIDER_SITE_OTHER): Payer: BC Managed Care – PPO | Admitting: Physician Assistant

## 2020-10-08 DIAGNOSIS — M25561 Pain in right knee: Secondary | ICD-10-CM | POA: Diagnosis not present

## 2020-10-08 DIAGNOSIS — M25562 Pain in left knee: Secondary | ICD-10-CM | POA: Diagnosis not present

## 2020-10-08 DIAGNOSIS — M351 Other overlap syndromes: Secondary | ICD-10-CM

## 2020-10-08 DIAGNOSIS — Z79899 Other long term (current) drug therapy: Secondary | ICD-10-CM

## 2020-10-08 LAB — COMPLETE METABOLIC PANEL WITH GFR
AG Ratio: 1.4 (calc) (ref 1.0–2.5)
ALT: 19 U/L (ref 6–29)
AST: 17 U/L (ref 10–35)
Albumin: 4 g/dL (ref 3.6–5.1)
Alkaline phosphatase (APISO): 56 U/L (ref 37–153)
BUN: 17 mg/dL (ref 7–25)
CO2: 35 mmol/L — ABNORMAL HIGH (ref 20–32)
Calcium: 9.6 mg/dL (ref 8.6–10.4)
Chloride: 99 mmol/L (ref 98–110)
Creat: 0.83 mg/dL (ref 0.50–1.03)
Globulin: 2.9 g/dL (calc) (ref 1.9–3.7)
Glucose, Bld: 114 mg/dL — ABNORMAL HIGH (ref 65–99)
Potassium: 4.2 mmol/L (ref 3.5–5.3)
Sodium: 141 mmol/L (ref 135–146)
Total Bilirubin: 0.8 mg/dL (ref 0.2–1.2)
Total Protein: 6.9 g/dL (ref 6.1–8.1)
eGFR: 82 mL/min/{1.73_m2} (ref >=60)

## 2020-10-08 LAB — CBC WITH DIFFERENTIAL/PLATELET
Absolute Monocytes: 407 cells/uL (ref 200–950)
Basophils Absolute: 37 cells/uL (ref 0–200)
Basophils Relative: 0.5 %
Eosinophils Absolute: 52 cells/uL (ref 15–500)
Eosinophils Relative: 0.7 %
HCT: 40.7 % (ref 35.0–45.0)
Hemoglobin: 12.9 g/dL (ref 11.7–15.5)
Lymphs Abs: 955 cells/uL (ref 850–3900)
MCH: 29.3 pg (ref 27.0–33.0)
MCHC: 31.7 g/dL — ABNORMAL LOW (ref 32.0–36.0)
MCV: 92.5 fL (ref 80.0–100.0)
MPV: 10.7 fL (ref 7.5–12.5)
Monocytes Relative: 5.5 %
Neutro Abs: 5950 cells/uL (ref 1500–7800)
Neutrophils Relative %: 80.4 %
Platelets: 317 10*3/uL (ref 140–400)
RBC: 4.4 10*6/uL (ref 3.80–5.10)
RDW: 14.9 % (ref 11.0–15.0)
Total Lymphocyte: 12.9 %
WBC: 7.4 10*3/uL (ref 3.8–10.8)

## 2020-10-08 NOTE — Progress Notes (Signed)
Office Visit Note   [name]           Date of Birth: Oct 01, 1960           MRN: [id number] Visit Date: 10/08/2020              Requested by: [place], [name], MD [address] [place],  [place] [address] PCP: [place], [name], MD  No chief complaint on file.     HPI: Patient presents today 3 weeks status post bilateral steroid knee injections she has a history of rheumatoid arthritis and lupus.  She does say she still has pain but is significantly improved  Assessment & Plan: Visit Diagnoses: No diagnosis found.  Plan: Patient will schedule an appointment for 3 months for another set of injections.  I told her if she needed injection sooner to contact us.  Follow-Up Instructions: No follow-ups on file.   Ortho Exam  Patient is alert, oriented, no adenopathy, well-dressed, normal affect, normal respiratory effort. Bilateral lower extremities no effusion no swelling.  She has less crepitus than on her last visit movement is much more free air without pain minimal tenderness over the medial lateral joint lines and patellofemoral joints of her knees no effusion or cellulitis  Imaging: No results found. No images are attached to the encounter.  Labs: Lab Results  Component Value Date   HGBA1C 6.2 08/20/2020   ESRSEDRATE 17 06/24/2020   ESRSEDRATE 17 01/22/2020   ESRSEDRATE 29 10/16/2019   CRP 0.6 10/11/2015   REPTSTATUS 02/10/2013 FINAL 02/08/2013   CULT  02/08/2013    No Beta Hemolytic Streptococci Isolated Performed at [facility]     Lab Results  Component Value Date   ALBUMIN 3.9 05/27/2019   ALBUMIN 3.9 03/11/2019   ALBUMIN 4.0 09/18/2017    Lab Results  Component Value Date   MG 2.2 03/14/2019   MG 1.7 03/13/2019   Lab Results  Component Value Date   VD25OH 44 01/22/2020   VD25OH 36 03/30/2017   VD25OH 38 08/19/2016    No results found for: PREALBUMIN CBC EXTENDED Latest Ref Rng & Units 06/24/2020 04/15/2020 01/22/2020   WBC 3.8 - 10.8 Thousand/uL 4.0 5.9 4.5  RBC 3.80 - 5.10 Million/uL 4.34 4.15 4.21  HGB 11.7 - 15.5 g/dL 50.2 77.4 12.8  HCT 78.6 - 45.0 % 39.4 37.6 38.0  PLT 140 - 400 Thousand/uL 307 286 326  NEUTROABS 1,500 - 7,800 cells/uL 3,032 4,885 3,159  LYMPHSABS 850 - 3,900 cells/uL 644(L) 625(L) 909     There is no height or weight on file to calculate BMI.  Orders:  No orders of the defined types were placed in this encounter.  No orders of the defined types were placed in this encounter.    Procedures: No procedures performed  Clinical Data: No additional findings.  ROS:  All other systems negative, except as noted in the HPI. Review of Systems  Objective: Vital Signs: There were no vitals taken for this visit.  Specialty Comments:  No specialty comments available.  PMFS History: Patient Active Problem List   Diagnosis Date Noted   Hypokalemia 07/07/2019   Precordial chest pain 05/04/2019   Educated about COVID-19 virus infection 05/04/2019   Persistent vomiting    Abdominal pain, epigastric    Nausea & vomiting 03/11/2019   Gastritis    Nausea without vomiting 02/28/2019   Class 2 obesity with body mass index (BMI) of 37.0 to 37.9 in adult 11/25/2018  Mixed connective tissue disease (HCC) 08/14/2017   Recurrent genital herpes 05/12/2017   Shortness of breath 04/15/2017   Dizziness 02/04/2017   Situational anxiety 06/08/2016   Atypical chest pain 06/08/2016   Trapezius muscle spasm 04/23/2016   Fibromyalgia 02/13/2016   Primary insomnia 02/13/2016   Chronic fatigue 02/13/2016   Vitamin D deficiency 11/25/2015   Autoimmune disease (HCC) 11/23/2015   High risk medication use 11/23/2015   Colon cancer screening 01/25/2014   Anterior neck pain 12/01/2013   Inflammatory arthritis 08/18/2012   Allergic rhinitis 06/23/2012   HTN (hypertension) 06/23/2012   Hyperlipidemia 06/23/2012   Thyroid nodule - benign 06/23/2012   Arthralgia 06/23/2012   Mild  intermittent asthma    Sickle cell trait (HCC)    History of stomach ulcers    ESOPHAGEAL STRICTURE 10/04/2007   GERD 10/04/2007   Atrophic 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   Dysphagia, pharyngoesophageal phase 10/04/2007   Past Medical History:  Diagnosis Date   Allergy    Anemia    Asthma    Blood transfusion without reported diagnosis    Fibromyalgia    GERD (gastroesophageal reflux disease)    Heart murmur    High cholesterol    History of stomach ulcers    Hypertension    Lupus (HCC)    MI, old    Rheumatoid arthritis (HCC)    Thyroid disease    Thyroid mass    Vertigo     Family History  Problem Relation Age of Onset   Alcohol abuse Father    Hypertension Father    Heart disease Father    Other Mother        tobacco use disorder, refuses to see a doctor   Osteoporosis Sister    Colon cancer Maternal Uncle    Diabetes Maternal Grandmother    Healthy Son    Healthy Daughter    Multiple sclerosis Daughter    Colon polyps Neg Hx    Esophageal cancer Neg Hx    Kidney disease Neg Hx    Stomach cancer Neg Hx    Rectal cancer Neg Hx     Past Surgical History:  Procedure Laterality Date   ABDOMINAL HYSTERECTOMY     done for menorrhagia, ovaries remain   CERVICAL DISCECTOMY     plates and screws in neck   CESAREAN SECTION     COLONOSCOPY     ESOPHAGEAL MANOMETRY N/A 02/12/2014   Procedure: ESOPHAGEAL MANOMETRY (EM);  Surgeon: Louis Meckel, MD;  Location: [facility];  Service: Endoscopy;  Laterality: N/A;   ESOPHAGOGASTRODUODENOSCOPY  03/04/2011   Procedure: ESOPHAGOGASTRODUODENOSCOPY (EGD);  Surgeon: Louis Meckel, MD;  Location: [facility];  Service: Endoscopy;  Laterality: N/A;  BOTOX Injection   ESOPHAGOGASTRODUODENOSCOPY (EGD) WITH PROPOFOL N/A 03/12/2019   Procedure: ESOPHAGOGASTRODUODENOSCOPY (EGD) WITH PROPOFOL;  Surgeon: Jeani Hawking, MD;  Location: [facility] ENDOSCOPY;  Service: Endoscopy;  Laterality: N/A;   exc benign breast lump     TONSILLECTOMY     UPPER  GASTROINTESTINAL ENDOSCOPY     Social History   Occupational History   Occupation: Geologist, engineering    Employer: [employer]  Tobacco Use   Smoking status: Never   Smokeless tobacco: Never  Vaping Use   Vaping Use: Never used  Substance and Sexual Activity   Alcohol use: Not Currently    Alcohol/week: 0.0 standard drinks    Comment: occ   Drug use: No   Sexual activity: Yes    Birth control/protection: None, Surgical  Comment: LAVH

## 2020-10-09 NOTE — Progress Notes (Signed)
CBC and CMP are normal.  Glucose is mildly elevated, probably not a fasting sample.

## 2020-10-16 ENCOUNTER — Telehealth: Payer: Self-pay

## 2020-10-16 NOTE — Telephone Encounter (Signed)
I did not receive a call from the on-call service on Sunday.    I also agree that she should follow up with her PCP or urgent care for further evaluation.

## 2020-10-16 NOTE — Telephone Encounter (Signed)
Patient advised we did not get a call from the answering service.  Because patient is running a fever, she should be seen by her PCP or an urgent care.  Fibromyalgia flare does not cause fever.  Patient advised she should hold off Xeljanz and methotrexate until she has been seen by her PCP. Patient expressed understanding.

## 2020-10-16 NOTE — Telephone Encounter (Signed)
We did not get a call from the answering service.  Because patient is running a fever, she should be seen by her PCP or an urgent care.  Fibromyalgia flare does not cause fever.  She should hold off Xeljanz and methotrexate until she has been seen by her PCP.

## 2020-10-16 NOTE — Telephone Encounter (Signed)
Patient called stating on Sunday night, 10/13/20 she started running a fever and had burning pain in her arms and legs which she thinks was a fibro flair.  Patient states she called and left a detailed message with the answering service around 9:30 pm who told her they would give the message to the doctor on call.  Patient states she never received a return call.  Patient states her husband wanted her to go to the ER, but she didn't think it was safe for her to go because of her autoimmune disease.  Patient states her fever ran between 100.0 to 101.7 on Monday and Tuesday and could barely get out of bed.  Patient states she woke up this morning and was able to eat something, but now is starting to feel bad again.   Patient states she is very angry that no one has returned her call.

## 2020-10-30 ENCOUNTER — Other Ambulatory Visit: Payer: Self-pay

## 2020-10-30 ENCOUNTER — Encounter: Payer: Self-pay | Admitting: Family Medicine

## 2020-10-30 ENCOUNTER — Ambulatory Visit (INDEPENDENT_AMBULATORY_CARE_PROVIDER_SITE_OTHER): Payer: BC Managed Care – PPO | Admitting: Family Medicine

## 2020-10-30 VITALS — BP 118/70 | HR 76 | Resp 16 | Ht 65.0 in | Wt 219.0 lb

## 2020-10-30 DIAGNOSIS — K219 Gastro-esophageal reflux disease without esophagitis: Secondary | ICD-10-CM | POA: Diagnosis not present

## 2020-10-30 DIAGNOSIS — R5382 Chronic fatigue, unspecified: Secondary | ICD-10-CM

## 2020-10-30 DIAGNOSIS — R7989 Other specified abnormal findings of blood chemistry: Secondary | ICD-10-CM

## 2020-10-30 DIAGNOSIS — G4719 Other hypersomnia: Secondary | ICD-10-CM

## 2020-10-30 DIAGNOSIS — G4700 Insomnia, unspecified: Secondary | ICD-10-CM | POA: Diagnosis not present

## 2020-10-30 MED ORDER — TRAZODONE HCL 50 MG PO TABS: 25.0000 mg | ORAL_TABLET | Freq: Every evening | ORAL | 3 refills | Status: DC | PRN

## 2020-10-30 NOTE — Patient Instructions (Addendum)
A few things to remember from today's visit:   Gastroesophageal reflux disease, unspecified whether esophagitis present  Chronic fatigue - Plan: Ambulatory referral to Sleep Studies  Daytime hypersomnolence - Plan: Ambulatory referral to Sleep Studies  Insomnia, unspecified type - Plan: traZODone (DESYREL) 50 MG tablet  Abnormal TSH  If you need refills please call your pharmacy. Do not use My Chart to request refills or for acute issues that need immediate attention. Thyroid ultrasound: Call [phone].  Please be sure medication list is accurate. If a new problem present, please set up appointment sooner than planned today. Fatigue If you have fatigue, you feel tired all the time and have a lack of energy or a lack of motivation. Fatigue may make it difficult to start or complete tasks because of exhaustion. In general, occasional or mild fatigue is often a normal response to activity or life. However, long-lasting (chronic) or extreme fatigue may be a symptom of a medical condition. Follow these instructions at home: General instructions Watch your fatigue for any changes. Go to bed and get up at the same time every day. Avoid fatigue by pacing yourself during the day and getting enough sleep at night. Maintain a healthy weight. Medicines Take over-the-counter and prescription medicines only as told by your health care provider. Take a multivitamin, if told by your health care provider.  Do not use herbal or dietary supplements unless they are approved by your health care provider. Activity  Exercise regularly, as told by your health care provider. Use or practice techniques to help you relax, such as yoga, tai chi, meditation, or massage therapy. Eating and drinking  Avoid heavy meals in the evening. Eat a well-balanced diet, which includes lean proteins, whole grains, plenty of fruits and vegetables, and low-fat dairy products. Avoid consuming too much caffeine. Avoid  the use of alcohol. Drink enough fluid to keep your urine pale yellow. Lifestyle Change situations that cause you stress. Try to keep your work and personal schedule in balance. Do not use any products that contain nicotine or tobacco, such as cigarettes and e-cigarettes. If you need help quitting, ask your health care provider. Do not use drugs. Contact a health care provider if: Your fatigue does not get better. You have a fever. You suddenly lose or gain weight. You have headaches. You have trouble falling asleep or sleeping through the night. You feel angry, guilty, anxious, or sad. You are unable to have a bowel movement (constipation). Your skin is dry. You have swelling in your legs or another part of your body. Get help right away if: You feel confused. Your vision is blurry. You feel faint or you pass out. You have a severe headache. You have severe pain in your abdomen, your back, or the area between your waist and hips (pelvis). You have chest pain, shortness of breath, or an irregular or fast heartbeat. You are unable to urinate, or you urinate less than normal. You have abnormal bleeding, such as bleeding from the rectum, vagina, nose, lungs, or nipples. You vomit blood. You have thoughts about hurting yourself or others. If you ever feel like you may hurt yourself or others, or have thoughts about taking your own life, get help right away. You can go to your nearest emergency department or call: Your local emergency services (911 in the [place] A suicide crisis helpline, such as the [facility] at 720-035-9567. This is open 24 hours a day. Summary If you have fatigue, you  feel tired all the time and have a lack of energy or a lack of motivation. Fatigue may make it difficult to start or complete tasks because of exhaustion. Long-lasting (chronic) or extreme fatigue may be a symptom of a medical condition. Exercise regularly, as told by your  health care provider. Change situations that cause you stress. Try to keep your work and personal schedule in balance. This information is not intended to replace advice given to you by your health care provider. Make sure you discuss any questions you have with your health care provider. Document Revised: 11/16/2019 Document Reviewed: 11/16/2019 Elsevier Patient Education  2022 [employer].

## 2020-10-30 NOTE — Progress Notes (Signed)
ACUTE VISIT Chief Complaint  Patient presents with   Fatigue   HPI: [name] is a 59 y.o. female with hx of fibromyalgia,HTN,vit D def,autoimmune disease, and GERD here today complaining of persistent fatigue. Problem has been going on for a while,we addressed problem in 03/2019, but seems to be worse after she got her flu and pneumonia vaccine 2 weeks ago. Negative for fever,chills,cough,wheezing,SOB,urinary symptoms,or skin rash.  Sleeping about 4-5 hours according to her watch. She gets in bed at 8:30 pm and falls asleep 10:30 pm and 2 Am she is awake and cannot go back to sleep. She takes Melatonin. She does not take naps. If she sits for more than 30 min she gets sleepy. She has no problem while she is active or driving.  Pain is also affecting her sleep. She is on Prednisone 2.5 mg, which helps, pain has been worse since she ran out a couple days ago. She is planning on calling her rheumatologist.  She has not had thyroid US ordered a few months ago done. It seems like she could not being reached to schedule it.  Lab Results  Component Value Date   TSH 0.16 (L) 08/20/2020   Lab Results  Component Value Date   CREATININE 0.83 10/08/2020   BUN 17 10/08/2020   NA 141 10/08/2020   K 4.2 10/08/2020   CL 99 10/08/2020   CO2 35 (H) 10/08/2020   Lab Results  Component Value Date   WBC 7.4 10/08/2020   HGB 12.9 10/08/2020   HCT 40.7 10/08/2020   MCV 92.5 10/08/2020   PLT 317 10/08/2020   Lab Results  Component Value Date   ALT 19 10/08/2020   AST 17 10/08/2020   ALKPHOS 53 05/27/2019   BILITOT 0.8 10/08/2020   Acid reflux. She has been out of PPI for 10 days. She was on Protonix 40 mg bid and Pepcid 20 mg at bedtime. Symptoms are worse at bedtime. Epigastric burining like sensation. Negative for nausea, vomiting, changes in bowel habits, blood in stool or melena. She follow with GI.  Review of Systems  Constitutional:  Negative for appetite  change and unexpected weight change.  HENT:  Negative for mouth sores, nosebleeds, sore throat and trouble swallowing.   Eyes:  Negative for redness and visual disturbance.  Cardiovascular:  Negative for chest pain, palpitations and leg swelling.  Endocrine: Negative for cold intolerance, heat intolerance, polydipsia, polyphagia and polyuria.  Genitourinary:  Negative for decreased urine volume, dysuria and hematuria.  Musculoskeletal:  Positive for arthralgias, back pain and myalgias. Negative for gait problem.  Allergic/Immunologic: Positive for environmental allergies.  Neurological:  Negative for syncope, weakness and headaches.  Hematological:  Negative for adenopathy. Does not bruise/bleed easily.  Psychiatric/Behavioral:  Negative for confusion. The patient is nervous/anxious.   Rest see pertinent positives and negatives per HPI.  Current Outpatient Medications on File Prior to Visit  Medication Sig Dispense Refill   acetaminophen (TYLENOL) 650 MG CR tablet Take 650 mg by mouth every 8 (eight) hours as needed for pain.     albuterol (PROVENTIL) (2.5 MG/3ML) 0.083% nebulizer solution Take 3 mLs (2.5 mg total) by nebulization every 6 (six) hours as needed for wheezing or shortness of breath. 75 mL 12   Ascorbic Acid (VITAMIN C PO) Take by mouth.     B-D INS SYR ULTRAFINE 1CC/30G 30G X 1/2" 1 ML MISC      baclofen (LIORESAL) 10 MG tablet TAKE 1 TABLET BY MOUTH AT  BEDTIME AS NEEDED FOR MUSCLE SPASMS 90 tablet 1   CALCIUM PO Take by mouth daily.     Cholecalciferol 25 MCG (1000 UT) capsule Take by mouth.     diclofenac sodium (VOLTAREN) 1 % GEL Apply 3 g to 3 large joints up to 3 times daily. (Patient taking differently: Apply 2 g topically daily as needed (pain).) 3 Tube 3   Estradiol 10 MCG TABS vaginal tablet Yuvafem 10 mcg vaginal tablet     famotidine (PEPCID) 20 MG tablet TAKE 1 TO 2 TABLETS BY MOUTH AT BEDTIME 180 tablet 1   fluticasone furoate-vilanterol (BREO ELLIPTA) 100-25  MCG/INH AEPB Inhale 1 puff into the lungs daily. 28 each 0   folic acid (FOLVITE) 1 MG tablet TAKE 2 TABLETS (2 MG TOTAL) BY MOUTH DAILY. (Patient taking differently: Take 1 mg by mouth 2 (two) times daily.) 180 tablet 3   hydrochlorothiazide (HYDRODIURIL) 25 MG tablet TAKE 1 TABLET BY MOUTH EVERY DAY 90 tablet 3   KLOR-CON M20 20 MEQ tablet TAKE 1 TABLET BY MOUTH EVERY DAY 90 tablet 3   lovastatin (MEVACOR) 20 MG tablet Take 1 tablet (20 mg total) by mouth at bedtime. 30 tablet 3   Methotrexate Sodium (METHOTREXATE, PF,) 250 MG/10ML injection INJECT 0.6 MLS (15 MG TOTAL) INTO THE SKIN ONCE A WEEK. 8 mL 2   nystatin-triamcinolone (MYCOLOG II) cream Apply 1 application topically 2 (two) times daily. 60 g 0   Omega-3 Fatty Acids (FISH OIL) 1000 MG CAPS Take by mouth.     predniSONE (DELTASONE) 5 MG tablet Take 2.5 mg by mouth daily.      Tuberculin-Allergy Syringes (ALLERGY SYRINGE 1CC/27GX1/2") 27G X 1/2" 1 ML MISC Patient to use to inject MTX weekly 12 each 3   valACYclovir (VALTREX) 500 MG tablet TAKE 1 TABLET (500 MG TOTAL) BY MOUTH 2 (TWO) TIMES DAILY. FOR 3 DAYS WITH ACUTE EPISODES. (Patient taking differently: TAKE 1 TABLET (500 MG TOTAL) BY MOUTH 2 (TWO) TIMES DAILY.) 18 tablet 1   vitamin B-12 (CYANOCOBALAMIN) 100 MCG tablet Take 100 mcg by mouth daily.     VITAMIN D, ERGOCALCIFEROL, PO Take 1 capsule by mouth daily.     XELJANZ XR 11 MG TB24 Take 1 tablet by mouth daily.     Zinc Citrate-Phytase (ZYTAZE) 25-500 MG CAPS Take by mouth.     No current facility-administered medications on file prior to visit.   Past Medical History:  Diagnosis Date   Allergy    Anemia    Asthma    Blood transfusion without reported diagnosis    Fibromyalgia    GERD (gastroesophageal reflux disease)    Heart murmur    High cholesterol    History of stomach ulcers    Hypertension    Lupus (HCC)    MI, old    Rheumatoid arthritis (HCC)    Thyroid disease    Thyroid mass    Vertigo    Allergies   Allergen Reactions   Aspirin Other (See Comments)    GI bleed   Demerol [Meperidine]    Hydrocodone-Acetaminophen Hives and Nausea And Vomiting   Ibuprofen Other (See Comments)    GI bleed   Imdur [Isosorbide Nitrate] Other (See Comments)    Reaction unknown   Isosorbide Dinitrate     Other reaction(s): Other (see comments) Other reaction(s): Other (See Comments) Reaction unknown Reaction unknown   Meperidine Hcl Hives and Nausea And Vomiting    Short term memory loss   Morphine Hives  and Nausea And Vomiting   Oxycodone-Acetaminophen Hives and Nausea And Vomiting    Reaction unknown   Procaine Hcl Other (See Comments)    Ineffective   Toprol Xl [Metoprolol] Other (See Comments)    Reaction unknown   Tramadol Itching   Codeine Nausea Only    Other reaction(s): Other (see comments)    Social History   Socioeconomic History   Marital status: Married    Spouse name: Not on file   Number of children: 4   Years of education: Not on file   Highest education level: Not on file  Occupational History   Occupation: TEACHER ASSISTANT    Employer: [employer]  Tobacco Use   Smoking status: Never   Smokeless tobacco: Never  Vaping Use   Vaping Use: Never used  Substance and Sexual Activity   Alcohol use: Not Currently    Alcohol/week: 0.0 standard drinks    Comment: occ   Drug use: No   Sexual activity: Yes    Birth control/protection: None, Surgical    Comment: LAVH  Other Topics Concern   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     Social Determinants of Health   Financial Resource Strain: Not on file  Food Insecurity: Not on file  Transportation Needs: Not on file  Physical Activity: Not on file  Stress: Not on file  Social Connections: Not on file   Vitals:   10/30/20 1526  BP: 118/70  Pulse: 76  Resp: 16   Body mass index is 36.44 kg/m.  Physical Exam Vitals and  nursing note reviewed.  Constitutional:      General: She is not in acute distress.    Appearance: She is well-developed.  HENT:     Head: Normocephalic and atraumatic.     Mouth/Throat:     Mouth: Mucous membranes are moist.     Pharynx: Oropharynx is clear.  Eyes:     Conjunctiva/sclera: Conjunctivae normal.  Neck:     Thyroid: Thyromegaly present.  Cardiovascular:     Rate and Rhythm: Normal rate and regular rhythm.     Pulses:          Dorsalis pedis pulses are 2+ on the right side and 2+ on the left side.     Heart sounds: No murmur heard. Pulmonary:     Effort: Pulmonary effort is normal. No respiratory distress.     Breath sounds: Normal breath sounds.  Abdominal:     Palpations: Abdomen is soft. There is no hepatomegaly or mass.     Tenderness: There is no abdominal tenderness.  Lymphadenopathy:     Cervical: No cervical adenopathy.  Skin:    General: Skin is warm.     Findings: No erythema or rash.  Neurological:     General: No focal deficit present.     Mental Status: She is alert and oriented to person, place, and time.     Cranial Nerves: No cranial nerve deficit.     Gait: Gait normal.  Psychiatric:     Comments: Well groomed, good eye contact.   ASSESSMENT AND PLAN:  Ms.Maxwell was seen today for fatigue.  Diagnoses and all orders for this visit: Orders Placed This Encounter  Procedures   Ambulatory referral to Sleep Studies   Daytime hypersomnolence OSA to be considered. Sleep study will be arranged. Improving sleep may help. Wt loss will help.  Gastroesophageal  reflux disease, unspecified whether esophagitis present Resume Protonix 40 mg before breakfast. Continue Pepcid 20 mg at bedtime. GERD precautions to continue. Follow with GI if needed.  -     pantoprazole (PROTONIX) 40 MG tablet; Take 1 tablet (40 mg total) by mouth daily before breakfast.  Chronic fatigue We discussed possible etiologies: Systemic illness,  immunologic,endocrinology,sleep disorder, psychiatric/psychologic, infectious,medications side effects, and idiopathic. Examination today does not suggest a serious process. Some of her chronic medical problems can be contributing factors.  Insomnia, unspecified type Good sleep hygiene recommended. We discussed pharmacologic treatment options, she agrees with trying Trazodone 25-50 mg at bedtime. Some side effects discussed.  -     traZODone (DESYREL) 50 MG tablet; Take 0.5-1 tablets (25-50 mg total) by mouth at bedtime as needed for sleep.  Abnormal TSH We do not have lab service at this time. Phone numbers given, so she can have thyroid US scheduled. She will come back for lab next week.  Return in about 4 weeks (around 11/27/2020).  Guila Owensby G. [place], MD  [facility]. [place] office.

## 2020-11-02 ENCOUNTER — Encounter: Payer: Self-pay | Admitting: Family Medicine

## 2020-11-02 MED ORDER — PANTOPRAZOLE SODIUM 40 MG PO TBEC: 40.0000 mg | DELAYED_RELEASE_TABLET | Freq: Every day | ORAL | 1 refills | Status: DC

## 2020-11-06 ENCOUNTER — Ambulatory Visit
Admission: RE | Admit: 2020-11-06 | Discharge: 2020-11-06 | Disposition: A | Discharge status: Home / Self Care | Payer: BC Managed Care – PPO | Source: Ambulatory Visit | Attending: Family Medicine | Admitting: Family Medicine

## 2020-11-06 DIAGNOSIS — E041 Nontoxic single thyroid nodule: Secondary | ICD-10-CM

## 2020-11-11 NOTE — Progress Notes (Signed)
Office Visit Note  Patient: FREDDY SPADAFORA             Date of Birth: Jul 26, 1960           MRN: [id number]             PCP: [place], [name], MD Referring: [place], [name], MD Visit Date: 11/25/2020 Occupation: @GUAROCC @  Subjective:  Medication management.   History of Present Illness: Marsheila KENZIE THORESON is a 60 y.o. female with a history of mixed connective tissue disease, osteoarthritis and fibromyalgia syndrome.  She states she continues to have a lot of discomfort in her knee joints.  She has been under care of Dr. 41.  She states she had 2 series of cortisone injections in the last 1 month.  She will be starting on Visco supplement injections.  She continues to have pain and discomfort in most of her joints and muscles due to fibromyalgia.  She had a fibromyalgia flare about 3 weeks ago which was quite severe per patient and gradually the symptoms improved.  Activities of Daily Living:  Patient reports morning stiffness for 3 hours.   Patient Reports nocturnal pain.  Difficulty dressing/grooming: Denies Difficulty climbing stairs: Reports Difficulty getting out of chair: Reports Difficulty using hands for taps, buttons, cutlery, and/or writing: Denies  Review of Systems  Constitutional:  Positive for fatigue.  HENT:  Negative for mouth sores, mouth dryness and nose dryness.   Eyes:  Negative for pain, itching and dryness.  Respiratory:  Negative for shortness of breath and difficulty breathing.   Cardiovascular:  Negative for chest pain and palpitations.  Gastrointestinal:  Negative for blood in stool, constipation and diarrhea.  Endocrine: Positive for increased urination.  Genitourinary:  Negative for difficulty urinating.  Musculoskeletal:  Positive for joint pain, joint pain, joint swelling and morning stiffness. Negative for myalgias, muscle tenderness and myalgias.  Skin:  Positive for color change. Negative for rash and sensitivity to sunlight.   Allergic/Immunologic: Negative for susceptible to infections.  Neurological:  Positive for numbness. Negative for dizziness, headaches and memory loss.  Hematological:  Negative for bruising/bleeding tendency and swollen glands.  Psychiatric/Behavioral:  Positive for sleep disturbance. Negative for depressed mood and confusion. The patient is not nervous/anxious.    PMFS History:  Patient Active Problem List   Diagnosis Date Noted   Hypokalemia 07/07/2019   Precordial chest pain 05/04/2019   Educated about COVID-19 virus infection 05/04/2019   Persistent vomiting    Abdominal pain, epigastric    Nausea & vomiting 03/11/2019   Gastritis    Nausea without vomiting 02/28/2019   Class 2 obesity with body mass index (BMI) of 37.0 to 37.9 in adult 11/25/2018   Mixed connective tissue disease (HCC) 08/14/2017   Recurrent genital herpes 05/12/2017   Shortness of breath 04/15/2017   Dizziness 02/04/2017   Situational anxiety 06/08/2016   Atypical chest pain 06/08/2016   Trapezius muscle spasm 04/23/2016   Fibromyalgia 02/13/2016   Primary insomnia 02/13/2016   Chronic fatigue 02/13/2016   Vitamin D deficiency 11/25/2015   Autoimmune disease (HCC) 11/23/2015   High risk medication use 11/23/2015   Colon cancer screening 01/25/2014   Anterior neck pain 12/01/2013   Inflammatory arthritis 08/18/2012   Allergic rhinitis 06/23/2012   HTN (hypertension) 06/23/2012   Hyperlipidemia 06/23/2012   Thyroid nodule - benign 06/23/2012   Arthralgia 06/23/2012   Mild intermittent asthma    Sickle cell trait (HCC)    History of stomach ulcers  ESOPHAGEAL STRICTURE 10/04/2007   GERD 10/04/2007   Atrophic 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   Dysphagia, pharyngoesophageal phase 10/04/2007    Past Medical History:  Diagnosis Date   Allergy    Anemia    Asthma    Blood transfusion without reported diagnosis    Fibromyalgia    GERD (gastroesophageal reflux disease)    Heart murmur    High cholesterol     History of stomach ulcers    Hypertension    Lupus (HCC)    MI, old    Rheumatoid arthritis (HCC)    Thyroid disease    Thyroid mass    Vertigo     Family History  Problem Relation Age of Onset   Alcohol abuse Father    Hypertension Father    Heart disease Father    Other Mother        tobacco use disorder, refuses to see a doctor   Osteoporosis Sister    Colon cancer Maternal Uncle    Diabetes Maternal Grandmother    Healthy Son    Healthy Daughter    Multiple sclerosis Daughter    Colon polyps Neg Hx    Esophageal cancer Neg Hx    Kidney disease Neg Hx    Stomach cancer Neg Hx    Rectal cancer Neg Hx    Past Surgical History:  Procedure Laterality Date   ABDOMINAL HYSTERECTOMY     done for menorrhagia, ovaries remain   CERVICAL DISCECTOMY     plates and screws in neck   CESAREAN SECTION     COLONOSCOPY     ESOPHAGEAL MANOMETRY N/A 02/12/2014   Procedure: ESOPHAGEAL MANOMETRY (EM);  Surgeon: Louis Meckel, MD;  Location: [facility];  Service: Endoscopy;  Laterality: N/A;   ESOPHAGOGASTRODUODENOSCOPY  03/04/2011   Procedure: ESOPHAGOGASTRODUODENOSCOPY (EGD);  Surgeon: Louis Meckel, MD;  Location: [facility];  Service: Endoscopy;  Laterality: N/A;  BOTOX Injection   ESOPHAGOGASTRODUODENOSCOPY (EGD) WITH PROPOFOL N/A 03/12/2019   Procedure: ESOPHAGOGASTRODUODENOSCOPY (EGD) WITH PROPOFOL;  Surgeon: Jeani Hawking, MD;  Location: [facility] ENDOSCOPY;  Service: Endoscopy;  Laterality: N/A;   exc benign breast lump     TONSILLECTOMY     UPPER GASTROINTESTINAL ENDOSCOPY     Social History   Social History Narrative   First grade Geologist, 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     Immunization History  Administered Date(s) Administered   Influenza Inj Mdck Quad Pf 10/22/2020   Influenza Split 11/04/2015, 10/11/2017, 10/21/2018   Influenza, Quadrivalent, Recombinant, Inj, Pf 10/11/2017   Influenza,inj,Quad PF,6+ Mos 11/04/2015, 10/21/2018   PFIZER  Comirnaty(Gray Top)Covid-19 Tri-Sucrose Vaccine 05/10/2020   PFIZER(Purple Top)SARS-COV-2 Vaccination 03/20/2019, 04/12/2019, 12/07/2019, 05/10/2020   PNEUMOCOCCAL CONJUGATE-20 10/22/2020   Pneumococcal Polysaccharide-23 01/09/2020   Tdap 08/20/2020   Zoster Recombinat (Shingrix) 10/11/2017, 12/25/2017, 01/09/2020     Objective: Vital Signs: BP (!) 133/92 (BP Location: Left Arm, Patient Position: Sitting, Cuff Size: Normal)   Pulse (!) 56   Ht 5\' 5"  (1.651 m)   Wt 217 lb 6.4 oz (98.6 kg)   BMI 36.18 kg/m    Physical Exam Vitals and nursing note reviewed.  Constitutional:      Appearance: She is well-developed.  HENT:     Head: Normocephalic and atraumatic.  Eyes:     Conjunctiva/sclera: Conjunctivae normal.  Cardiovascular:     Rate and Rhythm: Normal rate and regular rhythm.     Heart sounds: Normal heart sounds.  Pulmonary:     Effort: Pulmonary effort  is normal.     Breath sounds: Normal breath sounds.  Abdominal:     General: Bowel sounds are normal.     Palpations: Abdomen is soft.  Musculoskeletal:     Cervical back: Normal range of motion.  Lymphadenopathy:     Cervical: No cervical adenopathy.  Skin:    General: Skin is warm and dry.     Capillary Refill: Capillary refill takes less than 2 seconds.  Neurological:     Mental Status: She is alert and oriented to person, place, and time.  Psychiatric:        Behavior: Behavior normal.     Musculoskeletal Exam: C-spine with good range of motion.  Shoulder joints, elbow joints, wrist joints, MCPs PIPs and DIPs with good range of motion without synovitis.  Hip joints with good range of motion.  She had discomfort range of motion of bilateral knee joints without any warmth swelling or effusion.  There was no tenderness over ankles or MTPs.  CDAI Exam: CDAI Score: -- Patient Global: --; Provider Global: -- Swollen: --; Tender: -- Joint Exam 11/25/2020   No joint exam has been documented for this visit   There  is currently no information documented on the homunculus. Go to the Rheumatology activity and complete the homunculus joint exam.  Investigation: No additional findings.  Imaging: US THYROID  Result Date: 11/06/2020 CLINICAL DATA:  Nodule follow-up EXAM: THYROID ULTRASOUND TECHNIQUE: Ultrasound examination of the thyroid gland and adjacent soft tissues was performed. COMPARISON:  12/07/2013 FINDINGS: Parenchymal Echotexture: Mildly heterogeneous Isthmus: 0.4 cm Right lobe: 6.4 x 2.8 x 3.2 cm Left lobe: 5.4 x 1.7 x 1.9 cm _________________________________________________________ Estimated total number of nodules >/= 1 cm: 1 Number of spongiform nodules >/=  2 cm not described below (TR1): 0 Number of mixed cystic and solid nodules >/= 1.5 cm not described below (TR2): 0 _________________________________________________________ Nodule # 1: Prior biopsy: No Location: Right; mid Maximum size: 5.4 cm; Other 2 dimensions: 3.0 x 2.4 cm, previously, 4.3 x 2.9 x 2.4 cm Composition: solid/almost completely solid (2) Echogenicity: isoechoic (1) Shape: not taller-than-wide (0) Margins: smooth (0) Echogenic foci: none (0) ACR TI-RADS total points: 3. ACR TI-RADS risk category:  TR3 (3 points). Significant change in size (>/= 20% in two dimensions and minimal increase of 2 mm): No Change in features: No Change in ACR TI-RADS risk category: No ACR TI-RADS recommendations: Given stability since 2015, findings consistent with a benign nodule. _________________________________________________________ Subcentimeter left thyroid nodule does not meet criteria for FNA or imaging surveillance. IMPRESSION: Dominant right thyroid nodule is not significantly changed in size since 12/07/2013 which indicates a benign etiology. No new suspicious thyroid nodule identified which meet criteria for imaging surveillance or FNA. The above is in keeping with the ACR TI-RADS recommendations - J Am Coll Radiol 2017;14:587-595. Electronically  Signed   By: Acquanetta Belling M.D.   On: 11/06/2020 12:20    Recent Labs: Lab Results  Component Value Date   WBC 7.4 10/08/2020   HGB 12.9 10/08/2020   PLT 317 10/08/2020   NA 141 10/08/2020   K 4.2 10/08/2020   CL 99 10/08/2020   CO2 35 (H) 10/08/2020   GLUCOSE 114 (H) 10/08/2020   BUN 17 10/08/2020   CREATININE 0.83 10/08/2020   BILITOT 0.8 10/08/2020   ALKPHOS 53 05/27/2019   AST 17 10/08/2020   ALT 19 10/08/2020   PROT 6.9 10/08/2020   ALBUMIN 3.9 05/27/2019   CALCIUM 9.6 10/08/2020   GFRAA  97 06/24/2020   QFTBGOLDPLUS NEGATIVE 01/22/2020    Speciality Comments: No specialty comments available.  Procedures:  No procedures performed Allergies: Aspirin, Demerol [meperidine], Hydrocodone-acetaminophen, Ibuprofen, Imdur [isosorbide nitrate], Isosorbide dinitrate, Meperidine hcl, Morphine, Oxycodone-acetaminophen, Procaine hcl, Toprol xl [metoprolol], Tramadol, and Codeine   Assessment / Plan:     Visit Diagnoses: Mixed connective tissue disease (HCC) - +ANA,+Sm,+RNP,+RF, arthritis.  Patient had no synovitis on examination.  There is no history of oral ulcers, nasal ulcers, malar rash, photosensitivity, or lymphadenopathy.  She continues to have some off-and-on joint discomfort.  No inflammation was noted today.  High risk medication use - Xeljanz 11 mg XR by mouth daily, MTX 0.6 ml sq once weekly (reduced dose low WBC count), folic acid 2 mg daily, and prednisone 2.5 mg daily.  Labs obtained on October 08, 2020 were reviewed CBC was normal, CMP with GFR was normal her TB gold was negative on January 22, 2020.  Autoimmune labs obtained in June were unremarkable.  She has been advised to get labs in December and then every 3 months to monitor for drug toxicity.  She will also get TB Gold with the next labs and autoimmune labs.  She had lipid panel in August 2022.  Her LDL was elevated.  She has been advised to discuss that with her PCP.  Information about immunization was placed in the  AVS and discussed with the patient.  She was advised to hold Xeljanz and methotrexate in case she develops an infection and resume after the infection resolves.  Long term systemic steroid user - she is unable to taper prednisone.  Chronic pain of both knees - X-ray showed bilateral mild osteoarthritis and chondromalacia patella.  She recently had cortisone injections x2 by Dr. Lajoyce Corners.  She states she will be getting Visco supplement injections.  Due to ongoing pain and discomfort in her knee joints she will not be able to go to the gym.  She wanted a letter to excuse her from participating in the gym activities which was given to her today.  Trochanteric bursitis of both hips-she is off-and-on discomfort in the trochanteric area.  IT band stretches was emphasized.  Chronic pain of both shoulders-doing better.  Fibromyalgia -she continues to have generalized pain and discomfort.  She had a severe flare about 3 weeks ago.  She  is on baclofen 10 mg at bedtime  Primary insomnia-she has chronic insomnia.  Good sleep hygiene was discussed.  Chronic fatigue-related to fibromyalgia and insomnia.  Osteoporosis screening - DEXA updated on 02/12/20 T-sore -0.3  Other medical problems listed as follows:  History of vitamin D deficiency  History of asthma  History of gastroesophageal reflux (GERD)  History of high cholesterol-increased risk of heart disease with autoimmune disease was also discussed.  Dietary modifications and exercises were discussed.  The handout was placed in the AVS.  History of hypertension-blood pressure was elevated today.  She was advised to monitor blood pressure closely.  History of stomach ulcers  Orders: No orders of the defined types were placed in this encounter.  No orders of the defined types were placed in this encounter.    Follow-Up Instructions: Return in about 3 months (around 02/25/2021) for MCTD, OA.   Pollyann Savoy, MD  Note - This record has  been created using Animal nutritionist.  Chart creation errors have been sought, but may not always  have been located. Such creation errors do not reflect on  the standard of medical care.

## 2020-11-18 ENCOUNTER — Encounter: Payer: Self-pay | Admitting: Rheumatology

## 2020-11-18 ENCOUNTER — Ambulatory Visit (INDEPENDENT_AMBULATORY_CARE_PROVIDER_SITE_OTHER): Payer: BC Managed Care – PPO | Admitting: Orthopedic Surgery

## 2020-11-18 ENCOUNTER — Other Ambulatory Visit: Payer: Self-pay

## 2020-11-18 DIAGNOSIS — M25562 Pain in left knee: Secondary | ICD-10-CM

## 2020-11-18 DIAGNOSIS — M25561 Pain in right knee: Secondary | ICD-10-CM | POA: Diagnosis not present

## 2020-11-18 DIAGNOSIS — M359 Systemic involvement of connective tissue, unspecified: Secondary | ICD-10-CM | POA: Diagnosis not present

## 2020-11-18 MED ORDER — FOLIC ACID 1 MG PO TABS: 2.0000 mg | ORAL_TABLET | Freq: Every day | ORAL | 3 refills | Status: DC

## 2020-11-18 NOTE — Telephone Encounter (Signed)
Next Visit: 11/25/2020  Last Visit: 07/17/2020  Last Fill: 07/13/2018  Dx: Mixed connective tissue disease   Current Dose per office note on 07/17/2020: folic acid 2 mg daily  Okay to refill Folic Acid?

## 2020-11-19 ENCOUNTER — Encounter: Payer: Self-pay | Admitting: Orthopedic Surgery

## 2020-11-19 ENCOUNTER — Telehealth: Payer: Self-pay

## 2020-11-19 DIAGNOSIS — M25562 Pain in left knee: Secondary | ICD-10-CM

## 2020-11-19 DIAGNOSIS — M25561 Pain in right knee: Secondary | ICD-10-CM | POA: Diagnosis not present

## 2020-11-19 NOTE — Telephone Encounter (Signed)
Bilateral knee gel injection auth for OA

## 2020-11-19 NOTE — Progress Notes (Signed)
Office Visit Note   [name]           Date of Birth: [birth year]           MRN: [id number] Visit Date: 11/18/2020              Requested by: [name], [name], MD [address] [place],  [place] [address] PCP: [name], [name], MD  Chief Complaint  Patient presents with   Right Knee - Pain    Hx rheumatoid arthritis and lupus S/p cortisone inj 10/08/20   Left Knee - Pain    Hx rheumatoid arthritis and lupus S/p cortisone inj 10/08/20      HPI: Patient is a 60 year old woman who presents with a history of bilateral knee pain with a history of lupus and rheumatoid arthritis.  Patient states she has pain worse with start up.  She states she has stopped working out secondary to her knee pain.  Assessment & Plan: Visit Diagnoses:  1. Pain in both knees, unspecified chronicity   2. Autoimmune disease ([place])     Plan: Recommended patient resume her gym activities recommended a stationary bike as well as close chain kinetic exercises and these were reviewed.  We will request authorization for gel injections for both knees.  Follow-Up Instructions: No follow-ups on file.   Ortho Exam  Patient is alert, oriented, no adenopathy, well-dressed, normal affect, normal respiratory effort. Examination patient has pain with start up she has crepitation with range of motion of both knees collaterals and cruciates are stable there is no redness no cellulitis there is a mild effusion.  Imaging: No results found. No images are attached to the encounter.  Labs: Lab Results  Component Value Date   HGBA1C 6.2 08/20/2020   ESRSEDRATE 17 06/24/2020   ESRSEDRATE 17 01/22/2020   ESRSEDRATE 29 10/16/2019   CRP 0.6 10/11/2015   REPTSTATUS 02/10/2013 FINAL 02/08/2013   CULT  02/08/2013    No Beta Hemolytic Streptococci Isolated Performed at [facility]     Lab Results  Component Value Date   ALBUMIN 3.9 05/27/2019   ALBUMIN 3.9 03/11/2019   ALBUMIN 4.0  09/18/2017    Lab Results  Component Value Date   MG 2.2 03/14/2019   MG 1.7 03/13/2019   Lab Results  Component Value Date   VD25OH 44 01/22/2020   VD25OH 36 03/30/2017   VD25OH 38 08/19/2016    No results found for: PREALBUMIN CBC EXTENDED Latest Ref Rng & Units 10/08/2020 06/24/2020 04/15/2020  WBC 3.8 - 10.8 Thousand/uL 7.4 4.0 5.9  RBC 3.80 - 5.10 Million/uL 4.40 4.34 4.15  HGB 11.7 - 15.5 g/dL 12.9 12.7 12.1  HCT 35.0 - 45.0 % 40.7 39.4 37.6  PLT 140 - 400 Thousand/uL 317 307 286  NEUTROABS 1,500 - 7,800 cells/uL 5,950 3,032 4,885  LYMPHSABS 850 - 3,900 cells/uL 955 644(L) 625(L)     There is no height or weight on file to calculate BMI.  Orders:  No orders of the defined types were placed in this encounter.  No orders of the defined types were placed in this encounter.    Procedures: Large Joint Inj: bilateral knee on 11/19/2020 12:45 PM Indications: pain and diagnostic evaluation Details: 22 G 1.5 in needle, anteromedial approach  Arthrogram: No  Outcome: tolerated well, no immediate complications Procedure, treatment alternatives, risks and benefits explained, specific risks discussed. Consent was given by the patient. Immediately prior to procedure a time out was called to  verify the correct patient, procedure, equipment, support staff and site/side marked as required. Patient was prepped and draped in the usual sterile fashion.     Clinical Data: No additional findings.  ROS:  All other systems negative, except as noted in the HPI. Review of Systems  Objective: Vital Signs: There were no vitals taken for this visit.  Specialty Comments:  No specialty comments available.  PMFS History: Patient Active Problem List   Diagnosis Date Noted   Hypokalemia 07/07/2019   Precordial chest pain 05/04/2019   Educated about COVID-19 virus infection 05/04/2019   Persistent vomiting    Abdominal pain, epigastric    Nausea & vomiting 03/11/2019   Gastritis     Nausea without vomiting 02/28/2019   Class 2 obesity with body mass index (BMI) of 37.0 to 37.9 in adult 11/25/2018   Mixed connective tissue disease ([place]) 08/14/2017   Recurrent genital herpes 05/12/2017   Shortness of breath 04/15/2017   Dizziness 02/04/2017   Situational anxiety 06/08/2016   Atypical chest pain 06/08/2016   Trapezius muscle spasm 04/23/2016   Fibromyalgia 02/13/2016   Primary insomnia 02/13/2016   Chronic fatigue 02/13/2016   Vitamin D deficiency 11/25/2015   Autoimmune disease ([place]) 11/23/2015   High risk medication use 11/23/2015   Colon cancer screening 01/25/2014   Anterior neck pain 12/01/2013   Inflammatory arthritis 08/18/2012   Allergic rhinitis 06/23/2012   HTN (hypertension) 06/23/2012   Hyperlipidemia 06/23/2012   Thyroid nodule - benign 06/23/2012   Arthralgia 06/23/2012   Mild intermittent asthma    Sickle cell trait ([place])    History of stomach ulcers    ESOPHAGEAL STRICTURE 10/04/2007   GERD 10/04/2007   Atrophic 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   Dysphagia, pharyngoesophageal phase 10/04/2007   Past Medical History:  Diagnosis Date   Allergy    Anemia    Asthma    Blood transfusion without reported diagnosis    Fibromyalgia    GERD (gastroesophageal reflux disease)    Heart murmur    High cholesterol    History of stomach ulcers    Hypertension    Lupus (HCC)    MI, old    Rheumatoid arthritis ([place])    Thyroid disease    Thyroid mass    Vertigo     Family History  Problem Relation Age of Onset   Alcohol abuse Father    Hypertension Father    Heart disease Father    Other Mother        tobacco use disorder, refuses to see a doctor   Osteoporosis Sister    Colon cancer Maternal Uncle    Diabetes Maternal Grandmother    Healthy Son    Healthy Daughter    Multiple sclerosis Daughter    Colon polyps Neg Hx    Esophageal cancer Neg Hx    Kidney disease Neg Hx    Stomach cancer Neg Hx    Rectal cancer Neg Hx     Past Surgical  History:  Procedure Laterality Date   ABDOMINAL HYSTERECTOMY     done for menorrhagia, ovaries remain   CERVICAL DISCECTOMY     plates and screws in neck   CESAREAN SECTION     COLONOSCOPY     ESOPHAGEAL MANOMETRY N/A 02/12/2014   Procedure: ESOPHAGEAL MANOMETRY (EM);  Surgeon: Inda Castle, MD;  Location: [facility] END[facility]COPY;  Service: Endoscopy;  Laterality: N/A;   ESOPHAGOGASTRODUODENOSCOPY  03/04/2011   Procedure: ESOPHAGOGASTRODUODENOSCOPY (EGD);  Surgeon: Inda Castle, MD;  Location: [facility]  ENDOS[facility];  Service: Endoscopy;  Laterality: N/A;  BOTOX Injection   ESOPHAGOGASTRODUODENOSCOPY (EGD) WITH PROPOFOL N/A 03/12/2019   Procedure: ESOPHAGOGASTRODUODENOSCOPY (EGD) WITH PROPOFOL;  Surgeon: Jeani Hawking, MD;  Location[facility] ENDOSCOPY;  Service: Endoscopy;  Laterality: N/A;   exc benign breast lump     TONSILLECTOMY     UPPER GASTROINTESTINAL ENDOSCOPY     Social History   Occupational History   Occupation: Lobbyist: [employer]  Tobacco Use   Smoking status: Never   Smokeless tobacco: Never  Vaping Use   Vaping Use: Never used  Substance and Sexual Activity   Alcohol use: Not Currently    Alcohol/week: 0.0 standard drinks    Comment: occ   Drug use: No   Sexual activity: Yes    Birth control/protection: None, Surgical    Comment: LAVH

## 2020-11-19 NOTE — Telephone Encounter (Signed)
Noted  

## 2020-11-25 ENCOUNTER — Encounter: Payer: Self-pay | Admitting: Rheumatology

## 2020-11-25 ENCOUNTER — Other Ambulatory Visit: Payer: Self-pay

## 2020-11-25 ENCOUNTER — Ambulatory Visit (INDEPENDENT_AMBULATORY_CARE_PROVIDER_SITE_OTHER): Payer: BC Managed Care – PPO | Admitting: Rheumatology

## 2020-11-25 VITALS — BP 133/92 | HR 56 | Ht 65.0 in | Wt 217.4 lb

## 2020-11-25 DIAGNOSIS — Z8711 Personal history of peptic ulcer disease: Secondary | ICD-10-CM

## 2020-11-25 DIAGNOSIS — Z8709 Personal history of other diseases of the respiratory system: Secondary | ICD-10-CM

## 2020-11-25 DIAGNOSIS — R5382 Chronic fatigue, unspecified: Secondary | ICD-10-CM

## 2020-11-25 DIAGNOSIS — F5101 Primary insomnia: Secondary | ICD-10-CM

## 2020-11-25 DIAGNOSIS — Z8719 Personal history of other diseases of the digestive system: Secondary | ICD-10-CM

## 2020-11-25 DIAGNOSIS — M7062 Trochanteric bursitis, left hip: Secondary | ICD-10-CM

## 2020-11-25 DIAGNOSIS — M25562 Pain in left knee: Secondary | ICD-10-CM

## 2020-11-25 DIAGNOSIS — M25511 Pain in right shoulder: Secondary | ICD-10-CM

## 2020-11-25 DIAGNOSIS — Z79899 Other long term (current) drug therapy: Secondary | ICD-10-CM

## 2020-11-25 DIAGNOSIS — M797 Fibromyalgia: Secondary | ICD-10-CM

## 2020-11-25 DIAGNOSIS — Z13820 Encounter for screening for osteoporosis: Secondary | ICD-10-CM

## 2020-11-25 DIAGNOSIS — M25512 Pain in left shoulder: Secondary | ICD-10-CM

## 2020-11-25 DIAGNOSIS — Z7952 Long term (current) use of systemic steroids: Secondary | ICD-10-CM | POA: Diagnosis not present

## 2020-11-25 DIAGNOSIS — M351 Other overlap syndromes: Secondary | ICD-10-CM | POA: Diagnosis not present

## 2020-11-25 DIAGNOSIS — Z8639 Personal history of other endocrine, nutritional and metabolic disease: Secondary | ICD-10-CM

## 2020-11-25 DIAGNOSIS — G8929 Other chronic pain: Secondary | ICD-10-CM

## 2020-11-25 DIAGNOSIS — M25561 Pain in right knee: Secondary | ICD-10-CM | POA: Diagnosis not present

## 2020-11-25 DIAGNOSIS — Z8679 Personal history of other diseases of the circulatory system: Secondary | ICD-10-CM

## 2020-11-25 DIAGNOSIS — M7061 Trochanteric bursitis, right hip: Secondary | ICD-10-CM

## 2020-11-25 NOTE — Patient Instructions (Addendum)
Standing Labs We placed an order today for your standing lab work.   Please have your standing labs drawn in December and every 3 months  If possible, please have your labs drawn 2 weeks prior to your appointment so that the provider can discuss your results at your appointment.  Please note that you may see your imaging and lab results in MyChart before we have reviewed them. We may be awaiting multiple results to interpret others before contacting you. Please allow our office up to 72 hours to thoroughly review all of the results before contacting the office for clarification of your results.  We have open lab daily: Monday through Thursday from 1:30-4:30 PM and Friday from 1:30-4:00 PM at the office of Dr. Pollyann Savoy, [facility].   Please be advised, all patients with office appointments requiring lab work will take precedent over walk-in lab work.  If possible, please come for your lab work on Monday and Friday afternoons, as you may experience shorter wait times. The office is located at [address], [place], [place] [address] No appointment is necessary.   Labs are drawn by [facility]. Please bring your co-pay at the time of your lab draw.  You may receive a bill from [facility] for your lab work.  If you wish to have your labs drawn at another location, please call the office 24 hours in advance to send orders.  If you have any questions regarding directions or hours of operation,  please call [phone].   As a reminder, please drink plenty of water prior to coming for your lab work. Thanks!   Vaccines You are taking a medication(s) that can suppress your immune system.  The following immunizations are recommended: Flu annually Covid-19  Td/Tdap (tetanus, diphtheria, pertussis) every 10 years Pneumonia (Prevnar 15 then Pneumovax 23 at least 1 year apart.  Alternatively, can take Prevnar 20 without needing additional dose) Shingrix: 2 doses from 4  weeks to 6 months apart  Please check with your PCP to make sure you are up to date.  COVID-19 vaccine recommendations:   COVID-19 vaccine is recommended for everyone (unless you are allergic to a vaccine component), even if you are on a medication that suppresses your immune system.   If you are on Methotrexate, Cellcept (mycophenolate), Rinvoq, [name], and Olumiant- hold the medication for 1 week after each vaccine. Hold Methotrexate for 2 weeks after the single dose COVID-19 vaccine.     Do not take Tylenol or any anti-inflammatory medications (NSAIDs) 24 hours prior to the COVID-19 vaccination.   There is no direct evidence about the efficacy of the COVID-19 vaccine in individuals who are on medications that suppress the immune system.   Even if you are fully vaccinated, and you are on any medications that suppress your immune system, please continue to wear a mask, maintain at least six feet social distance and practice hand hygiene.   If you develop a COVID-19 infection, please contact your PCP or our office to determine if you need monoclonal antibody infusion.  The booster vaccine is now available for immunocompromised patients.   Please see the following web sites for updated information.   [email or web address]-Patient-Resources.[email or web address]  If you have signs or symptoms of an infection or start antibiotics: First, call your PCP for workup of your infection. Hold your medication through the infection, until you complete your antibiotics, and until symptoms resolve if you take the following: Injectable medication (Actemra, Benlysta, Cimzia, Cosentyx, Enbrel, Humira, Kevzara, Orencia,  Remicade, Simponi, Stelara, Taltz, Tremfya) Methotrexate Leflunomide (Arava) Mycophenolate (Cellcept) [name], Olumiant, or Rinvoq   Heart Disease Prevention   Your inflammatory disease increases your risk of heart disease which includes heart attack,  stroke, atrial fibrillation (irregular heartbeats), high blood pressure, heart failure and atherosclerosis (plaque in the arteries).  It is important to reduce your risk by:   Keep blood pressure, cholesterol, and blood sugar at healthy levels   Smoking Cessation   Maintain a healthy weight  BMI 20-25   Eat a healthy diet  Plenty of fresh fruit, vegetables, and whole grains  Limit saturated fats, foods high in sodium, and added sugars  DASH and Mediterranean diet   Increase physical activity  Recommend moderate physically activity for 150 minutes per week/ 30 minutes a day for five days a week These can be broken up into three separate ten-minute sessions during the day.   Reduce Stress  Meditation, slow breathing exercises, yoga, coloring books  Dental visits twice a year

## 2020-11-29 ENCOUNTER — Telehealth: Payer: Self-pay

## 2020-11-29 ENCOUNTER — Other Ambulatory Visit: Payer: Self-pay | Admitting: Family Medicine

## 2020-11-29 DIAGNOSIS — K219 Gastro-esophageal reflux disease without esophagitis: Secondary | ICD-10-CM

## 2020-11-29 NOTE — Telephone Encounter (Signed)
VOB submitted for SYnviscOne,bilateral knee. °BV pending °

## 2020-11-30 ENCOUNTER — Other Ambulatory Visit: Payer: Self-pay | Admitting: Family Medicine

## 2020-12-15 ENCOUNTER — Other Ambulatory Visit: Payer: Self-pay

## 2020-12-15 ENCOUNTER — Emergency Department (HOSPITAL_BASED_OUTPATIENT_CLINIC_OR_DEPARTMENT_OTHER): Payer: BC Managed Care – PPO

## 2020-12-15 ENCOUNTER — Emergency Department (HOSPITAL_BASED_OUTPATIENT_CLINIC_OR_DEPARTMENT_OTHER)
Admission: EM | Admit: 2020-12-15 | Discharge: 2020-12-15 | Disposition: A | Discharge status: Home / Self Care | Payer: BC Managed Care – PPO | Attending: Emergency Medicine | Admitting: Emergency Medicine

## 2020-12-15 ENCOUNTER — Encounter (HOSPITAL_BASED_OUTPATIENT_CLINIC_OR_DEPARTMENT_OTHER): Payer: Self-pay

## 2020-12-15 DIAGNOSIS — I10 Essential (primary) hypertension: Secondary | ICD-10-CM | POA: Diagnosis not present

## 2020-12-15 DIAGNOSIS — Z7951 Long term (current) use of inhaled steroids: Secondary | ICD-10-CM | POA: Diagnosis not present

## 2020-12-15 DIAGNOSIS — R059 Cough, unspecified: Secondary | ICD-10-CM | POA: Diagnosis present

## 2020-12-15 DIAGNOSIS — J4520 Mild intermittent asthma, uncomplicated: Secondary | ICD-10-CM | POA: Diagnosis not present

## 2020-12-15 DIAGNOSIS — Z20822 Contact with and (suspected) exposure to covid-19: Secondary | ICD-10-CM | POA: Insufficient documentation

## 2020-12-15 DIAGNOSIS — J069 Acute upper respiratory infection, unspecified: Secondary | ICD-10-CM | POA: Insufficient documentation

## 2020-12-15 DIAGNOSIS — Z79899 Other long term (current) drug therapy: Secondary | ICD-10-CM | POA: Insufficient documentation

## 2020-12-15 HISTORY — DX: Unspecified osteoarthritis, unspecified site: M19.90

## 2020-12-15 LAB — RESP PANEL BY RT-PCR (RSV, FLU A&B, COVID)  RVPGX2
Influenza A by PCR: NEGATIVE
Influenza B by PCR: NEGATIVE
Resp Syncytial Virus by PCR: NEGATIVE
SARS Coronavirus 2 by RT PCR: NEGATIVE

## 2020-12-15 MED ORDER — AMOXICILLIN-POT CLAVULANATE 875-125 MG PO TABS: 1.0000 | ORAL_TABLET | Freq: Two times a day (BID) | ORAL | 0 refills | Status: DC

## 2020-12-15 NOTE — ED Triage Notes (Signed)
Pt c/o productive cough, sore throat, itchy throat, chest tightness, shortness of breath.  Pt had fever three days ago, denies at this time.

## 2020-12-15 NOTE — Discharge Instructions (Addendum)
I have given you a prescription for Augmentin.  This is an antibiotic.  If your flu/COVID/RSV panel come back negative then go ahead and start this.  If that test is positive for any 1 of those viruses and you likely do not need this antibiotic.  Just do normal supportive care for your respiratory infection, avoid susceptible individuals and follow-up with your doctor in a couple days if not improving.

## 2020-12-15 NOTE — ED Provider Notes (Signed)
[facility] EMERGENCY DEPARTMENT Provider Note   CSN: [id number] Arrival date & time: 12/15/20  0456     History Chief Complaint  Patient presents with   Cough    Patricia Reed is a 60 y.o. female.  5 to 6 days of progressively worsening cough, or throat.  Had a fever earlier.  Has had rhinorrhea that was initially clear then became white and now has been green and brown.  Has postnasal drip is causing her to cough more.  She is concerned that she might have RSV or some other medical disease.  Presents here to see if she can go to church or not.  No known sick contacts.  she is on methotrexate just gave herself a shot yesterday.  Has albuterol at home and tried that which seemed to help a little bit prior to arrival   Cough     Past Medical History:  Diagnosis Date   Allergy    Anemia    Asthma    Blood transfusion without reported diagnosis    Fibromyalgia    GERD (gastroesophageal reflux disease)    Heart murmur    High cholesterol    History of stomach ulcers    Hypertension    Lupus (HCC)    MI, old    Osteoarthritis    Rheumatoid arthritis (HCC)    Thyroid disease    Thyroid mass    Vertigo     Patient Active Problem List   Diagnosis Date Noted   Hypokalemia 07/07/2019   Precordial chest pain 05/04/2019   Educated about COVID-19 virus infection 05/04/2019   Persistent vomiting    Abdominal pain, epigastric    Nausea & vomiting 03/11/2019   Gastritis    Nausea without vomiting 02/28/2019   Class 2 obesity with body mass index (BMI) of 37.0 to 37.9 in adult 11/25/2018   Mixed connective tissue disease (HCC) 08/14/2017   Recurrent genital herpes 05/12/2017   Shortness of breath 04/15/2017   Dizziness 02/04/2017   Situational anxiety 06/08/2016   Atypical chest pain 06/08/2016   Trapezius muscle spasm 04/23/2016   Fibromyalgia 02/13/2016   Primary insomnia 02/13/2016   Chronic fatigue 02/13/2016   Vitamin D deficiency 11/25/2015    Autoimmune disease (HCC) 11/23/2015   High risk medication use 11/23/2015   Colon cancer screening 01/25/2014   Anterior neck pain 12/01/2013   Inflammatory arthritis 08/18/2012   Allergic rhinitis 06/23/2012   HTN (hypertension) 06/23/2012   Hyperlipidemia 06/23/2012   Thyroid nodule - benign 06/23/2012   Arthralgia 06/23/2012   Mild intermittent asthma    Sickle cell trait (HCC)    History of stomach ulcers    ESOPHAGEAL STRICTURE 10/04/2007   GERD 10/04/2007   Atrophic 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   Dysphagia, pharyngoesophageal phase 10/04/2007    Past Surgical History:  Procedure Laterality Date   ABDOMINAL HYSTERECTOMY     done for menorrhagia, ovaries remain   CERVICAL DISCECTOMY     plates and screws in neck   CESAREAN SECTION     COLONOSCOPY     ESOPHAGEAL MANOMETRY N/A 02/12/2014   Procedure: ESOPHAGEAL MANOMETRY (EM);  Surgeon: Louis Meckel, MD;  Location: [facility];  Service: Endoscopy;  Laterality: N/A;   ESOPHAGOGASTRODUODENOSCOPY  03/04/2011   Procedure: ESOPHAGOGASTRODUODENOSCOPY (EGD);  Surgeon: Louis Meckel, MD;  Location: [facility];  Service: Endoscopy;  Laterality: N/A;  BOTOX Injection   ESOPHAGOGASTRODUODENOSCOPY (EGD) WITH PROPOFOL N/A 03/12/2019   Procedure: ESOPHAGOGASTRODUODENOSCOPY (EGD) WITH PROPOFOL;  Surgeon: Elnoria Howard,  Luisa Hart, MD;  Location: [facility] ENDOSCOPY;  Service: Endoscopy;  Laterality: N/A;   exc benign breast lump     TONSILLECTOMY     UPPER GASTROINTESTINAL ENDOSCOPY       OB History     Gravida  5   Para  3   Term  3   Preterm      AB      Living         SAB      IAB      Ectopic      Multiple      Live Births              Family History  Problem Relation Age of Onset   Alcohol abuse Father    Hypertension Father    Heart disease Father    Other Mother        tobacco use disorder, refuses to see a doctor   Osteoporosis Sister    Colon cancer Maternal Uncle    Diabetes Maternal Grandmother    Healthy Son     Healthy Daughter    Multiple sclerosis Daughter    Colon polyps Neg Hx    Esophageal cancer Neg Hx    Kidney disease Neg Hx    Stomach cancer Neg Hx    Rectal cancer Neg Hx     Social History   Tobacco Use   Smoking status: Never   Smokeless tobacco: Never  Vaping Use   Vaping Use: Never used  Substance Use Topics   Alcohol use: Yes    Comment: 1 glass every 3 months   Drug use: No    Home Medications Prior to Admission medications   Medication Sig Start Date End Date Taking? Authorizing Provider  amoxicillin-clavulanate (AUGMENTIN) 875-125 MG tablet Take 1 tablet by mouth 2 (two) times daily. One po bid x 7 days 12/15/20  Yes Buddie Marston, Barbara Cower, MD  acetaminophen (TYLENOL) 650 MG CR tablet Take 650 mg by mouth every 8 (eight) hours as needed for pain.    [provider]  albuterol (PROVENTIL) (2.5 MG/3ML) 0.083% nebulizer solution Take 3 mLs (2.5 mg total) by nebulization every 6 (six) hours as needed for wheezing or shortness of breath. 03/30/19   Glenford Bayley, NP  Ascorbic Acid (VITAMIN C PO) Take by mouth.    [provider]  B-D INS SYR ULTRAFINE 1CC/30G 30G X 1/2" 1 ML MISC  03/05/18   [provider]  baclofen (LIORESAL) 10 MG tablet TAKE 1 TABLET BY MOUTH AT BEDTIME AS NEEDED FOR MUSCLE SPASMS 08/28/19   [place], [name], MD  CALCIUM PO Take by mouth daily.    [provider]  Cholecalciferol 25 MCG (1000 UT) capsule Take by mouth.    [provider]  diclofenac sodium (VOLTAREN) 1 % GEL Apply 3 g to 3 large joints up to 3 times daily. Patient taking differently: Apply 2 g topically daily as needed (pain). 05/11/17   Gearldine Bienenstock, PA-C  Estradiol 10 MCG TABS vaginal tablet Yuvafem 10 mcg vaginal tablet    [provider]  famotidine (PEPCID) 20 MG tablet TAKE 1 TO 2 TABLETS BY MOUTH AT BEDTIME 10/20/19   [place], [name], MD  fluticasone furoate-vilanterol (BREO ELLIPTA) 100-25 MCG/INH AEPB Inhale 1 puff into the lungs  daily. 03/30/19   Glenford Bayley, NP  folic acid (FOLVITE) 1 MG tablet Take 2 tablets (2 mg total) by mouth daily. 11/18/20   Gearldine Bienenstock,  PA-C  hydrochlorothiazide (HYDRODIURIL) 25 MG tablet TAKE 1 TABLET BY MOUTH EVERY DAY 09/13/20   [place], [name], MD  KLOR-CON M20 20 MEQ tablet TAKE 1 TABLET BY MOUTH EVERY DAY 09/27/20   [place], [name], MD  lovastatin (MEVACOR) 20 MG tablet TAKE 1 TABLET BY MOUTH EVERYDAY AT BEDTIME 12/02/20   [place], [name], MD  Methotrexate Sodium (METHOTREXATE, PF,) 250 MG/10ML injection INJECT 0.6 MLS (15 MG TOTAL) INTO THE SKIN ONCE A WEEK. 08/06/20   Gearldine Bienenstock, PA-C  nystatin-triamcinolone (MYCOLOG II) cream Apply 1 application topically 2 (two) times daily. 01/24/20   Gearldine Bienenstock, PA-C  Omega-3 Fatty Acids (FISH OIL) 1000 MG CAPS Take by mouth.    [provider]  pantoprazole (PROTONIX) 40 MG tablet TAKE 1 TABLET BY MOUTH DAILY BEFORE BREAKFAST 11/29/20   [place], [name], MD  predniSONE (DELTASONE) 5 MG tablet Take 2.5 mg by mouth daily.  04/05/18   [provider]  traZODone (DESYREL) 50 MG tablet Take 0.5-1 tablets (25-50 mg total) by mouth at bedtime as needed for sleep. 10/30/20   [place], [name], MD  Tuberculin-Allergy Syringes (ALLERGY SYRINGE 1CC/27GX1/2") 27G X 1/2" 1 ML MISC Patient to use to inject MTX weekly 08/21/16   Pollyann Savoy, MD  valACYclovir (VALTREX) 500 MG tablet TAKE 1 TABLET (500 MG TOTAL) BY MOUTH 2 (TWO) TIMES DAILY. FOR 3 DAYS WITH ACUTE EPISODES. Patient taking differently: TAKE 1 TABLET (500 MG TOTAL) BY MOUTH 2 (TWO) TIMES DAILY. 04/17/19   [place], [name], MD  vitamin B-12 (CYANOCOBALAMIN) 100 MCG tablet Take 100 mcg by mouth daily.    [provider]  VITAMIN D, ERGOCALCIFEROL, PO Take 1 capsule by mouth daily.    [provider]  XELJANZ XR 11 MG TB24 Take 1 tablet by mouth daily. 03/02/18   [provider]  Zinc Citrate-Phytase (ZYTAZE) 25-500 MG CAPS Take by mouth.    [provider]    Allergies    Aspirin, Demerol [meperidine], Hydrocodone-acetaminophen, Ibuprofen, Imdur [isosorbide nitrate], Isosorbide dinitrate, Meperidine hcl, Morphine, Oxycodone-acetaminophen, Procaine hcl, Toprol xl [metoprolol], Tramadol, and Codeine  Review of Systems   Review of Systems  Respiratory:  Positive for cough.   All other systems reviewed and are negative.  Physical Exam Updated Vital Signs BP (!) 145/84   Pulse 95   Temp 98 F (36.7 C) (Oral)   Resp (!) 22   Ht 5\' 5"  (1.651 m)   Wt 98 kg   SpO2 100%   BMI 35.94 kg/m   Physical Exam Vitals and nursing note reviewed.  Constitutional:      Appearance: She is well-developed.  HENT:     Head: Normocephalic and atraumatic.     Mouth/Throat:     Mouth: Mucous membranes are moist.     Pharynx: Oropharynx is clear.  Eyes:     Pupils: Pupils are equal, round, and reactive to light.  Cardiovascular:     Rate and Rhythm: Normal rate and regular rhythm.  Pulmonary:     Effort: Pulmonary effort is normal. No respiratory distress.     Breath sounds: No stridor.  Abdominal:     General: Abdomen is flat. There is no distension.  Musculoskeletal:     Cervical back: Normal range of motion.  Skin:    General: Skin is warm and dry.  Neurological:     General: No focal deficit present.     Mental Status: She is alert.    ED  Results / Procedures / Treatments   Labs (all labs ordered are listed, but only abnormal results are displayed) Labs Reviewed  RESP PANEL BY RT-PCR (RSV, FLU A&B, COVID)  [id number]    EKG EKG Interpretation  Date/Time:  /27/22 0630             Kynadi Dragos, Barbara Cower, MD 12/15/20 [phone]

## 2020-12-16 ENCOUNTER — Telehealth: Payer: Self-pay

## 2020-12-16 NOTE — Telephone Encounter (Signed)
Attempted to contact the patient and left message for patient to call the office.  

## 2020-12-16 NOTE — Telephone Encounter (Signed)
Patient left a voicemail stating she was just diagnosed with a respiratory infection and prescribed an antibiotic.  Patient states the pharmacist told her there is an interaction with the Methotrexate medication.  Patient states she just took her Methotrexate injection on Saturday, 12/14/20 and is not sure how long she needs to wait until she can start her antibiotic.  Patient requested a return call.

## 2020-12-17 NOTE — Telephone Encounter (Signed)
Patient advised to hold her MTX until she has completed the antibiotics and her infection has completely resolved. Patient expressed understanding.

## 2021-01-07 ENCOUNTER — Telehealth: Payer: Self-pay

## 2021-01-07 ENCOUNTER — Other Ambulatory Visit: Payer: Self-pay

## 2021-01-07 ENCOUNTER — Other Ambulatory Visit: Payer: Self-pay | Admitting: *Deleted

## 2021-01-07 ENCOUNTER — Ambulatory Visit (INDEPENDENT_AMBULATORY_CARE_PROVIDER_SITE_OTHER): Payer: BC Managed Care – PPO | Admitting: Orthopedic Surgery

## 2021-01-07 DIAGNOSIS — M25562 Pain in left knee: Secondary | ICD-10-CM | POA: Diagnosis not present

## 2021-01-07 DIAGNOSIS — M351 Other overlap syndromes: Secondary | ICD-10-CM

## 2021-01-07 DIAGNOSIS — M25561 Pain in right knee: Secondary | ICD-10-CM | POA: Diagnosis not present

## 2021-01-07 DIAGNOSIS — Z79899 Other long term (current) drug therapy: Secondary | ICD-10-CM

## 2021-01-07 LAB — CBC WITH DIFFERENTIAL/PLATELET
Absolute Monocytes: 297 cells/uL (ref 200–950)
Basophils Absolute: 20 cells/uL (ref 0–200)
Basophils Relative: 0.7 %
Eosinophils Absolute: 31 cells/uL (ref 15–500)
Eosinophils Relative: 1.1 %
HCT: 38 % (ref 35.0–45.0)
Hemoglobin: 12.6 g/dL (ref 11.7–15.5)
Lymphs Abs: 1114 cells/uL (ref 850–3900)
MCH: 30 pg (ref 27.0–33.0)
MCHC: 33.2 g/dL (ref 32.0–36.0)
MCV: 90.5 fL (ref 80.0–100.0)
MPV: 11.1 fL (ref 7.5–12.5)
Monocytes Relative: 10.6 %
Neutro Abs: 1338 cells/uL — ABNORMAL LOW (ref 1500–7800)
Neutrophils Relative %: 47.8 %
Platelets: 299 10*3/uL (ref 140–400)
RBC: 4.2 10*6/uL (ref 3.80–5.10)
RDW: 13.8 % (ref 11.0–15.0)
Total Lymphocyte: 39.8 %
WBC: 2.8 10*3/uL — ABNORMAL LOW (ref 3.8–10.8)

## 2021-01-07 LAB — COMPLETE METABOLIC PANEL WITH GFR
AG Ratio: 1.5 (calc) (ref 1.0–2.5)
ALT: 18 U/L (ref 6–29)
AST: 21 U/L (ref 10–35)
Albumin: 4.3 g/dL (ref 3.6–5.1)
Alkaline phosphatase (APISO): 50 U/L (ref 37–153)
BUN: 13 mg/dL (ref 7–25)
CO2: 32 mmol/L (ref 20–32)
Calcium: 9.1 mg/dL (ref 8.6–10.4)
Chloride: 102 mmol/L (ref 98–110)
Creat: 0.73 mg/dL (ref 0.50–1.03)
Globulin: 2.8 g/dL (calc) (ref 1.9–3.7)
Glucose, Bld: 99 mg/dL (ref 65–99)
Potassium: 3.7 mmol/L (ref 3.5–5.3)
Sodium: 143 mmol/L (ref 135–146)
Total Bilirubin: 0.5 mg/dL (ref 0.2–1.2)
Total Protein: 7.1 g/dL (ref 6.1–8.1)
eGFR: 95 mL/min/{1.73_m2} (ref >=60)

## 2021-01-07 NOTE — Telephone Encounter (Signed)
Approved for SynviscOne, bilateral knee. Buy & Bill Must meet deductible first through secondary insurance Patient will be responsible for 30% OOP. Co-pay of $94.00-$188.00 No PA required, per [facility] of [place], Code 11. Reference # [id number]

## 2021-01-08 ENCOUNTER — Encounter: Payer: Self-pay | Admitting: Orthopedic Surgery

## 2021-01-08 DIAGNOSIS — M25562 Pain in left knee: Secondary | ICD-10-CM

## 2021-01-08 DIAGNOSIS — M25561 Pain in right knee: Secondary | ICD-10-CM | POA: Diagnosis not present

## 2021-01-08 MED ORDER — LIDOCAINE HCL 1 % IJ SOLN
1.0000 mL | INTRAMUSCULAR | Status: AC | PRN
  Administered 2021-01-08: 12:00:00 1 mL

## 2021-01-08 MED ORDER — HYLAN G-F 20 48 MG/6ML IX SOSY
48.0000 mg | PREFILLED_SYRINGE | INTRA_ARTICULAR | Status: AC | PRN
Start: 2021-01-08 — End: 2021-01-08
  Administered 2021-01-08: 48 mg via INTRA_ARTICULAR

## 2021-01-08 MED ORDER — HYLAN G-F 20 48 MG/6ML IX SOSY
48.0000 mg | PREFILLED_SYRINGE | INTRA_ARTICULAR | Status: AC | PRN
  Administered 2021-01-08: 48 mg via INTRA_ARTICULAR

## 2021-01-08 NOTE — Progress Notes (Signed)
CMP is normal.  White cell count is low.  Please advise patient to stop methotrexate.  Recheck CBC with differential in 3 weeks.

## 2021-01-08 NOTE — Progress Notes (Signed)
Office Visit Note   [name]           Date of Birth: 11/11/1960           MRN: [id number] Visit Date: 01/07/2021              Requested by: [name], [name], MD [address] [place],  [place] [address] PCP: [name], [name], MD  Chief Complaint  Patient presents with   Right Knee - Follow-up    Synvisc one bilateral knee buy and bill    Left Knee - Follow-up      HPI: Patient is a 60 year old woman who presents in follow-up for osteoarthritis both knees.  Patient has recently undergone steroid injections with good interval relief.  Due to the improvement with steroid injection patient presents at this time for evaluation for hyaluronic acid injections.  Assessment & Plan: Visit Diagnoses:  1. Pain in both knees, unspecified chronicity     Plan: Both knees were injected with Synvisc 1 she tolerated this well.  Follow-Up Instructions: Return if symptoms worsen or fail to improve.   Ortho Exam  Patient is alert, oriented, no adenopathy, well-dressed, normal affect, normal respiratory effort. Examination patient has no effusion of either knee.  She is tender to palpation of the medial lateral joint line as well as the patellofemoral joint there is crepitation with range of motion.  Imaging: No results found. No images are attached to the encounter.  Labs: Lab Results  Component Value Date   HGBA1C 6.2 08/20/2020   ESRSEDRATE 17 06/24/2020   ESRSEDRATE 17 01/22/2020   ESRSEDRATE 29 10/16/2019   CRP 0.6 10/11/2015   REPTSTATUS 02/10/2013 FINAL 02/08/2013   CULT  02/08/2013    No Beta Hemolytic Streptococci Isolated Performed at [facility]     Lab Results  Component Value Date   ALBUMIN 3.9 05/27/2019   ALBUMIN 3.9 03/11/2019   ALBUMIN 4.0 09/18/2017    Lab Results  Component Value Date   MG 2.2 03/14/2019   MG 1.7 03/13/2019   Lab Results  Component Value Date   VD25OH 44 01/22/2020   VD25OH 36 03/30/2017   VD25OH  38 08/19/2016    No results found for: PREALBUMIN CBC EXTENDED Latest Ref Rng & Units 01/07/2021 10/08/2020 06/24/2020  WBC 3.8 - 10.8 Thousand/uL 2.8(L) 7.4 4.0  RBC 3.80 - 5.10 Million/uL 4.20 4.40 4.34  HGB 11.7 - 15.5 g/dL 12.6 12.9 12.7  HCT 35.0 - 45.0 % 38.0 40.7 39.4  PLT 140 - 400 Thousand/uL 299 317 307  NEUTROABS 1,500 - 7,800 cells/uL 1,338(L) 5,950 3,032  LYMPHSABS 850 - 3,900 cells/uL 1,114 955 644(L)     There is no height or weight on file to calculate BMI.  Orders:  No orders of the defined types were placed in this encounter.  No orders of the defined types were placed in this encounter.    Procedures: Large Joint Inj: bilateral knee on 01/08/2021 11:39 AM Indications: pain and diagnostic evaluation Details: 22 G 1.5 in needle, anteromedial approach  Arthrogram: No  Medications (Right): 48 mg Hylan 48 MG/6ML; 1 mL lidocaine 1 % Medications (Left): 48 mg Hylan 48 MG/6ML; 1 mL lidocaine 1 % Outcome: tolerated well, no immediate complications Procedure, treatment alternatives, risks and benefits explained, specific risks discussed. Consent was given by the patient. Immediately prior to procedure a time out was called to verify the correct patient, procedure, equipment, support staff and site/side marked as required. Patient  was prepped and draped in the usual sterile fashion.     Clinical Data: No additional findings.  ROS:  All other systems negative, except as noted in the HPI. Review of Systems  Objective: Vital Signs: There were no vitals taken for this visit.  Specialty Comments:  No specialty comments available.  PMFS History: Patient Active Problem List   Diagnosis Date Noted   Hypokalemia 07/07/2019   Precordial chest pain 05/04/2019   Educated about COVID-19 virus infection 05/04/2019   Persistent vomiting    Abdominal pain, epigastric    Nausea & vomiting 03/11/2019   Gastritis    Nausea without vomiting 02/28/2019   Class 2 obesity  with body mass index (BMI) of 37.0 to 37.9 in adult 11/25/2018   Mixed connective tissue disease ([place]) 08/14/2017   Recurrent genital herpes 05/12/2017   Shortness of breath 04/15/2017   Dizziness 02/04/2017   Situational anxiety 06/08/2016   Atypical chest pain 06/08/2016   Trapezius muscle spasm 04/23/2016   Fibromyalgia 02/13/2016   Primary insomnia 02/13/2016   Chronic fatigue 02/13/2016   Vitamin D deficiency 11/25/2015   Autoimmune disease ([place]) 11/23/2015   High risk medication use 11/23/2015   Colon cancer screening 01/25/2014   Anterior neck pain 12/01/2013   Inflammatory arthritis 08/18/2012   Allergic rhinitis 06/23/2012   HTN (hypertension) 06/23/2012   Hyperlipidemia 06/23/2012   Thyroid nodule - benign 06/23/2012   Arthralgia 06/23/2012   Mild intermittent asthma    Sickle cell trait ([place])    History of stomach ulcers    ESOPHAGEAL STRICTURE 10/04/2007   GERD 10/04/2007   Atrophic 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   Dysphagia, pharyngoesophageal phase 10/04/2007   Past Medical History:  Diagnosis Date   Allergy    Anemia    Asthma    Blood transfusion without reported diagnosis    Fibromyalgia    GERD (gastroesophageal reflux disease)    Heart murmur    High cholesterol    History of stomach ulcers    Hypertension    Lupus ([place])    MI, old    Osteoarthritis    Rheumatoid arthritis ([place])    Thyroid disease    Thyroid mass    Vertigo     Family History  Problem Relation Age of Onset   Alcohol abuse Father    Hypertension Father    Heart disease Father    Other Mother        tobacco use disorder, refuses to see a doctor   Osteoporosis Sister    Colon cancer Maternal Uncle    Diabetes Maternal Grandmother    Healthy Son    Healthy Daughter    Multiple sclerosis Daughter    Colon polyps Neg Hx    Esophageal cancer Neg Hx    Kidney disease Neg Hx    Stomach cancer Neg Hx    Rectal cancer Neg Hx     Past Surgical History:  Procedure Laterality Date    ABDOMINAL HYSTERECTOMY     done for menorrhagia, ovaries remain   CERVICAL DISCECTOMY     plates and screws in neck   CESAREAN SECTION     COLONOSCOPY     ESOPHAGEAL MANOMETRY N/A 02/12/2014   Procedure: ESOPHAGEAL MANOMETRY (EM);  Surgeon: Inda Castle, MD;  Location: [facility] ENDOSCOPY;  Service: Endoscopy;  Laterality: N/A;   ESOPHAGOGASTRODUODENOSCOPY  03/04/2011   Procedure: ESOPHAGOGASTRODUODENOSCOPY (EGD);  Surgeon: Inda Castle, MD;  Location: [facility];  Service: Endoscopy;  Laterality: N/A;  BOTOX Injection  ESOPHAGOGASTRODUODENOSCOPY (EGD) WITH PROPOFOL N/A 03/12/2019   Procedure: ESOPHAGOGASTRODUODENOSCOPY (EGD) WITH PROPOFOL;  Surgeon: Jeani Hawking, MD;  Location: [facility] ENDOSCOPY;  Service: Endoscopy;  Laterality: N/A;   exc benign breast lump     TONSILLECTOMY     UPPER GASTROINTESTINAL ENDOSCOPY     Social History   Occupational History   Occupation: Geologist, engineering    Employer: [employer]  Tobacco Use   Smoking status: Never   Smokeless tobacco: Never  Vaping Use   Vaping Use: Never used  Substance and Sexual Activity   Alcohol use: Yes    Comment: 1 glass every 3 months   Drug use: No   Sexual activity: Yes    Birth control/protection: None, Surgical    Comment: LAVH

## 2021-01-08 NOTE — Progress Notes (Signed)
White cell count could be low due to the infection.  I would recommend repeat labs in 3 weeks.  She should hold methotrexate till then.  She would also hold off until the infection resolves.

## 2021-01-22 ENCOUNTER — Telehealth: Payer: Self-pay | Admitting: *Deleted

## 2021-01-22 DIAGNOSIS — M351 Other overlap syndromes: Secondary | ICD-10-CM

## 2021-01-22 DIAGNOSIS — M7061 Trochanteric bursitis, right hip: Secondary | ICD-10-CM

## 2021-01-22 DIAGNOSIS — M797 Fibromyalgia: Secondary | ICD-10-CM

## 2021-01-22 DIAGNOSIS — M7062 Trochanteric bursitis, left hip: Secondary | ICD-10-CM

## 2021-01-22 DIAGNOSIS — M25512 Pain in left shoulder: Secondary | ICD-10-CM

## 2021-01-22 DIAGNOSIS — G8929 Other chronic pain: Secondary | ICD-10-CM

## 2021-01-22 DIAGNOSIS — M25562 Pain in left knee: Secondary | ICD-10-CM

## 2021-01-22 NOTE — Addendum Note (Signed)
Addended by: Henriette Combs on: 01/22/2021 01:19 PM   Modules accepted: Orders

## 2021-01-22 NOTE — Telephone Encounter (Signed)
Patient states she was advised to hold her MTX due to infection and then due to her WBC being low. Patient states she has also weaned off the prednisone about 3 weeks. Patient states she is taking her [name] as prescribed. Patient states she is in a lot of pain. Patient states her pain is in all of her joints. Worse in her right knee and right shoulder. Patient would like to know what she can do for the pain. Please advise.

## 2021-01-22 NOTE — Telephone Encounter (Signed)
Referral placed.

## 2021-01-22 NOTE — Telephone Encounter (Signed)
I returned patient's call.  She states that she does not have any joint swelling.  She believes her fibromyalgia syndrome is flaring with pain all over her body.  She also has tingling sensation in her body.  She has been taking Tylenol which is not effective.  I offered referral to pain management.  She was in agreement.  We will refer her to pain management.

## 2021-01-28 ENCOUNTER — Other Ambulatory Visit: Payer: Self-pay | Admitting: *Deleted

## 2021-01-28 DIAGNOSIS — Z79899 Other long term (current) drug therapy: Secondary | ICD-10-CM

## 2021-01-28 DIAGNOSIS — M351 Other overlap syndromes: Secondary | ICD-10-CM

## 2021-01-28 LAB — CBC WITH DIFFERENTIAL/PLATELET
Absolute Monocytes: 234 cells/uL (ref 200–950)
Basophils Absolute: 30 cells/uL (ref 0–200)
Basophils Relative: 1 %
Eosinophils Absolute: 72 cells/uL (ref 15–500)
Eosinophils Relative: 2.4 %
HCT: 39.5 % (ref 35.0–45.0)
Hemoglobin: 12.6 g/dL (ref 11.7–15.5)
Lymphs Abs: 1065 cells/uL (ref 850–3900)
MCH: 29.5 pg (ref 27.0–33.0)
MCHC: 31.9 g/dL — ABNORMAL LOW (ref 32.0–36.0)
MCV: 92.5 fL (ref 80.0–100.0)
MPV: 11.1 fL (ref 7.5–12.5)
Monocytes Relative: 7.8 %
Neutro Abs: 1599 cells/uL (ref 1500–7800)
Neutrophils Relative %: 53.3 %
Platelets: 282 10*3/uL (ref 140–400)
RBC: 4.27 10*6/uL (ref 3.80–5.10)
RDW: 13.7 % (ref 11.0–15.0)
Total Lymphocyte: 35.5 %
WBC: 3 10*3/uL — ABNORMAL LOW (ref 3.8–10.8)

## 2021-01-28 LAB — COMPLETE METABOLIC PANEL WITH GFR
AG Ratio: 1.4 (calc) (ref 1.0–2.5)
ALT: 27 U/L (ref 6–29)
AST: 35 U/L (ref 10–35)
Albumin: 4.1 g/dL (ref 3.6–5.1)
Alkaline phosphatase (APISO): 51 U/L (ref 37–153)
BUN: 12 mg/dL (ref 7–25)
CO2: 31 mmol/L (ref 20–32)
Calcium: 9.1 mg/dL (ref 8.6–10.4)
Chloride: 102 mmol/L (ref 98–110)
Creat: 0.65 mg/dL (ref 0.50–1.03)
Globulin: 2.9 g/dL (calc) (ref 1.9–3.7)
Glucose, Bld: 84 mg/dL (ref 65–99)
Potassium: 3.8 mmol/L (ref 3.5–5.3)
Sodium: 141 mmol/L (ref 135–146)
Total Bilirubin: 0.5 mg/dL (ref 0.2–1.2)
Total Protein: 7 g/dL (ref 6.1–8.1)
eGFR: 102 mL/min/{1.73_m2} (ref >=60)

## 2021-01-29 ENCOUNTER — Other Ambulatory Visit: Payer: Self-pay | Admitting: *Deleted

## 2021-01-29 ENCOUNTER — Other Ambulatory Visit: Payer: Self-pay

## 2021-01-29 DIAGNOSIS — G4700 Insomnia, unspecified: Secondary | ICD-10-CM

## 2021-01-29 DIAGNOSIS — Z79899 Other long term (current) drug therapy: Secondary | ICD-10-CM

## 2021-01-29 DIAGNOSIS — M351 Other overlap syndromes: Secondary | ICD-10-CM

## 2021-01-29 MED ORDER — METHOTREXATE SODIUM CHEMO INJECTION 50 MG/2ML: 10.0000 mg | INTRAMUSCULAR | 0 refills | Status: DC

## 2021-01-29 MED ORDER — TRAZODONE HCL 50 MG PO TABS: 25.0000 mg | ORAL_TABLET | Freq: Every evening | ORAL | 1 refills | Status: DC | PRN

## 2021-01-29 NOTE — Progress Notes (Signed)
White cell count is low and stable.  Patient is clinically doing well.  She may reduce the dose of methotrexate to 0.4 mL subcu weekly.  CMP is normal.  Repeat CBC in 1 month.

## 2021-01-29 NOTE — Telephone Encounter (Signed)
-----   Message from Pollyann Savoy, MD sent at 01/29/2021  8:11 AM EST ----- White cell count is low and stable.  Patient is clinically doing well.  She may reduce the dose of methotrexate to 0.4 mL subcu weekly.  CMP is normal.  Repeat CBC in 1 month.

## 2021-03-04 ENCOUNTER — Other Ambulatory Visit: Payer: Self-pay | Admitting: *Deleted

## 2021-03-04 DIAGNOSIS — Z79899 Other long term (current) drug therapy: Secondary | ICD-10-CM

## 2021-03-04 LAB — CBC WITH DIFFERENTIAL/PLATELET
Absolute Monocytes: 110 cells/uL — ABNORMAL LOW (ref 200–950)
Basophils Absolute: 22 cells/uL (ref 0–200)
Basophils Relative: 0.4 %
Eosinophils Absolute: 0 cells/uL — ABNORMAL LOW (ref 15–500)
Eosinophils Relative: 0 %
HCT: 39 % (ref 35.0–45.0)
Hemoglobin: 12.8 g/dL (ref 11.7–15.5)
Lymphs Abs: 539 cells/uL — ABNORMAL LOW (ref 850–3900)
MCH: 29.4 pg (ref 27.0–33.0)
MCHC: 32.8 g/dL (ref 32.0–36.0)
MCV: 89.7 fL (ref 80.0–100.0)
MPV: 11.1 fL (ref 7.5–12.5)
Monocytes Relative: 2 %
Neutro Abs: 4829 cells/uL (ref 1500–7800)
Neutrophils Relative %: 87.8 %
Platelets: 318 10*3/uL (ref 140–400)
RBC: 4.35 10*6/uL (ref 3.80–5.10)
RDW: 13.3 % (ref 11.0–15.0)
Total Lymphocyte: 9.8 %
WBC: 5.5 10*3/uL (ref 3.8–10.8)

## 2021-03-05 NOTE — Progress Notes (Signed)
White cell count is normal now.  She should resume methotrexate if the infection resolved.  Please advise her to repeat labs in April which should include CBC with differential and CMP with GFR

## 2021-03-13 ENCOUNTER — Institutional Professional Consult (permissible substitution): Payer: BC Managed Care – PPO | Admitting: Adult Health

## 2021-03-16 ENCOUNTER — Other Ambulatory Visit: Payer: Self-pay

## 2021-03-16 ENCOUNTER — Encounter (HOSPITAL_BASED_OUTPATIENT_CLINIC_OR_DEPARTMENT_OTHER): Payer: Self-pay | Admitting: Emergency Medicine

## 2021-03-16 ENCOUNTER — Emergency Department (HOSPITAL_BASED_OUTPATIENT_CLINIC_OR_DEPARTMENT_OTHER): Payer: BC Managed Care – PPO

## 2021-03-16 ENCOUNTER — Emergency Department (HOSPITAL_BASED_OUTPATIENT_CLINIC_OR_DEPARTMENT_OTHER)
Admission: EM | Admit: 2021-03-16 | Discharge: 2021-03-16 | Disposition: A | Discharge status: Home / Self Care | Payer: BC Managed Care – PPO | Attending: Emergency Medicine | Admitting: Emergency Medicine

## 2021-03-16 DIAGNOSIS — R778 Other specified abnormalities of plasma proteins: Secondary | ICD-10-CM | POA: Insufficient documentation

## 2021-03-16 DIAGNOSIS — R5383 Other fatigue: Secondary | ICD-10-CM

## 2021-03-16 DIAGNOSIS — R0602 Shortness of breath: Secondary | ICD-10-CM | POA: Insufficient documentation

## 2021-03-16 DIAGNOSIS — E876 Hypokalemia: Secondary | ICD-10-CM | POA: Insufficient documentation

## 2021-03-16 DIAGNOSIS — Z20822 Contact with and (suspected) exposure to covid-19: Secondary | ICD-10-CM | POA: Diagnosis not present

## 2021-03-16 DIAGNOSIS — R079 Chest pain, unspecified: Secondary | ICD-10-CM | POA: Insufficient documentation

## 2021-03-16 LAB — URINALYSIS, ROUTINE W REFLEX MICROSCOPIC
Bilirubin Urine: NEGATIVE
Glucose, UA: NEGATIVE mg/dL
Hgb urine dipstick: NEGATIVE
Ketones, ur: NEGATIVE mg/dL
Leukocytes,Ua: NEGATIVE
Nitrite: NEGATIVE
Protein, ur: NEGATIVE mg/dL
Specific Gravity, Urine: 1.025 (ref 1.005–1.030)
pH: 6 (ref 5.0–8.0)

## 2021-03-16 LAB — CBG MONITORING, ED
Glucose-Capillary: 67 mg/dL — ABNORMAL LOW (ref 70–99)
Glucose-Capillary: 76 mg/dL (ref 70–99)

## 2021-03-16 LAB — CBC WITH DIFFERENTIAL/PLATELET
Abs Immature Granulocytes: 0.01 10*3/uL (ref 0.00–0.07)
Basophils Absolute: 0 10*3/uL (ref 0.0–0.1)
Basophils Relative: 1 %
Eosinophils Absolute: 0 10*3/uL (ref 0.0–0.5)
Eosinophils Relative: 1 %
HCT: 43.9 % (ref 36.0–46.0)
Hemoglobin: 13.9 g/dL (ref 12.0–15.0)
Immature Granulocytes: 0 %
Lymphocytes Relative: 23 %
Lymphs Abs: 1 10*3/uL (ref 0.7–4.0)
MCH: 29.6 pg (ref 26.0–34.0)
MCHC: 31.7 g/dL (ref 30.0–36.0)
MCV: 93.4 fL (ref 80.0–100.0)
Monocytes Absolute: 0.3 10*3/uL (ref 0.1–1.0)
Monocytes Relative: 7 %
Neutro Abs: 3 10*3/uL (ref 1.7–7.7)
Neutrophils Relative %: 68 %
Platelets: 299 10*3/uL (ref 150–400)
RBC: 4.7 MIL/uL (ref 3.87–5.11)
RDW: 14.3 % (ref 11.5–15.5)
WBC: 4.3 10*3/uL (ref 4.0–10.5)
nRBC: 0 % (ref 0.0–0.2)

## 2021-03-16 LAB — COMPREHENSIVE METABOLIC PANEL
ALT: 15 U/L (ref 0–44)
AST: 24 U/L (ref 15–41)
Albumin: 4.3 g/dL (ref 3.5–5.0)
Alkaline Phosphatase: 55 U/L (ref 38–126)
Anion gap: 11 (ref 5–15)
BUN: 20 mg/dL (ref 6–20)
CO2: 28 mmol/L (ref 22–32)
Calcium: 9.5 mg/dL (ref 8.9–10.3)
Chloride: 100 mmol/L (ref 98–111)
Creatinine, Ser: 0.93 mg/dL (ref 0.44–1.00)
GFR, Estimated: >60 mL/min (ref >60)
Glucose, Bld: 98 mg/dL (ref 70–99)
Potassium: 2.7 mmol/L — CL (ref 3.5–5.1)
Sodium: 139 mmol/L (ref 135–145)
Total Bilirubin: 0.9 mg/dL (ref 0.3–1.2)
Total Protein: 8.4 g/dL — ABNORMAL HIGH (ref 6.5–8.1)

## 2021-03-16 LAB — LACTIC ACID, PLASMA
Lactic Acid, Venous: 1.9 mmol/L (ref 0.5–1.9)
Lactic Acid, Venous: 1.4 mmol/L (ref 0.5–1.9)

## 2021-03-16 LAB — TROPONIN I (HIGH SENSITIVITY)
Troponin I (High Sensitivity): 8 ng/L (ref <18)
Troponin I (High Sensitivity): 9 ng/L (ref <18)

## 2021-03-16 LAB — RESP PANEL BY RT-PCR (FLU A&B, COVID) ARPGX2
Influenza A by PCR: NEGATIVE
Influenza B by PCR: NEGATIVE
SARS Coronavirus 2 by RT PCR: NEGATIVE

## 2021-03-16 LAB — MAGNESIUM: Magnesium: 1.8 mg/dL (ref 1.7–2.4)

## 2021-03-16 LAB — BRAIN NATRIURETIC PEPTIDE: B Natriuretic Peptide: 8.8 pg/mL (ref 0.0–100.0)

## 2021-03-16 MED ORDER — SODIUM CHLORIDE 0.9 % IV SOLN: Freq: Once | INTRAVENOUS | Status: AC

## 2021-03-16 MED ORDER — POTASSIUM CHLORIDE 10 MEQ/100ML IV SOLN
10.0000 meq | INTRAVENOUS | Status: AC
  Administered 2021-03-16 (×2): 10 meq via INTRAVENOUS
  Filled 2021-03-16 (×2): qty 100

## 2021-03-16 MED ORDER — POTASSIUM CHLORIDE CRYS ER 20 MEQ PO TBCR
40.0000 meq | EXTENDED_RELEASE_TABLET | Freq: Once | ORAL | Status: AC
Start: 2021-03-16 — End: 2021-03-16
  Administered 2021-03-16: 40 meq via ORAL
  Filled 2021-03-16: qty 2

## 2021-03-16 MED ORDER — SODIUM CHLORIDE 0.9 % IV BOLUS
500.0000 mL | Freq: Once | INTRAVENOUS | Status: AC
  Administered 2021-03-16: 500 mL via INTRAVENOUS

## 2021-03-16 NOTE — ED Notes (Signed)
Pts SpO2 while ambulating was 99-100 Heart rate: 90

## 2021-03-16 NOTE — ED Triage Notes (Signed)
Pt arrives pov with c/o shob and fatigue x 1 week. Pt had a near syncope in the room. Pt keeps stating "I just want to go to sleep"

## 2021-03-16 NOTE — ED Notes (Signed)
OJ given to patient

## 2021-03-16 NOTE — Discharge Instructions (Signed)
Take 40 mg of your potassium tomorrow and then go back to 20 mg like your normal dose.  Contact your doctor tomorrow because you will need to have a repeat potassium level done within the next few days.  Also talk to them about your medications.  Return to emergency room if you have any worsening symptoms.

## 2021-03-16 NOTE — ED Provider Notes (Addendum)
[facility] EMERGENCY DEPARTMENT Provider Note   CSN: [id number] Arrival date & time: 03/16/21  1527     History  Chief Complaint  Patient presents with   Shortness of Breath    Patricia Reed is a 61 y.o. female.  Patient is a 61 year old female who presents with fatigue.  She has a history of mixed connective tissue disease and fibromyalgia.  She is on Xeljanz and methotrexate.  She was previously on prednisone but is currently off of that.  She said that over the last week she has been feeling really tired and sleepy.  She goes to bed early at night.  She also has been feeling short of breath.  She feels short of breath when she walks even short distances.  She has no leg swelling.  No cough or cold symptoms.  No fevers.  She does have some intermittent chest pain.  She describes it as an aching in her left chest.  It does seem to come on when she is exerting herself.  She says she puts her hand over the area and pushes on it goes away after about 10 minutes.  It comes and goes.  She did have an episode this morning.  She denies any joint pains or swelling.  She did recently get established with a pain management clinic and was started on Cymbalta.  She is currently taking 30 mg.  She says that she started this last Friday and the symptoms started after that.  She does say it is working well for her joint pains.  She does not have any pains like she was having previously.  She does state that she will periodically break out in sweats.  She is not sure if it is her hot flashes or something else.      Home Medications Prior to Admission medications   Medication Sig Start Date End Date Taking? Authorizing Provider  acetaminophen (TYLENOL) 650 MG CR tablet Take 650 mg by mouth every 8 (eight) hours as needed for pain.    [provider]  albuterol (PROVENTIL) (2.5 MG/3ML) 0.083% nebulizer solution Take 3 mLs (2.5 mg total) by nebulization every 6 (six) hours as needed  for wheezing or shortness of breath. 03/30/19   Martyn Ehrich, NP  amoxicillin-clavulanate (AUGMENTIN) 875-125 MG tablet Take 1 tablet by mouth 2 (two) times daily. One po bid x 7 days 12/15/20   Mesner, Corene Cornea, MD  Ascorbic Acid (VITAMIN C PO) Take by mouth.    [provider]  B-D INS SYR ULTRAFINE 1CC/30G 30G X 1/2" 1 ML MISC  03/05/18   [provider]  baclofen (LIORESAL) 10 MG tablet TAKE 1 TABLET BY MOUTH AT BEDTIME AS NEEDED FOR MUSCLE SPASMS 08/28/19   [name], [name], MD  CALCIUM PO Take by mouth daily.    [provider]  Cholecalciferol 25 MCG (1000 UT) capsule Take by mouth.    [provider]  diclofenac sodium (VOLTAREN) 1 % GEL Apply 3 g to 3 large joints up to 3 times daily. Patient taking differently: Apply 2 g topically daily as needed (pain). 05/11/17   Ofilia Neas, PA-C  Estradiol 10 MCG TABS vaginal tablet Yuvafem 10 mcg vaginal tablet    [provider]  famotidine (PEPCID) 20 MG tablet TAKE 1 TO 2 TABLETS BY MOUTH AT BEDTIME 10/20/19   [name], [name], MD  fluticasone furoate-vilanterol (BREO ELLIPTA) 100-25 MCG/INH AEPB Inhale 1 puff into the lungs daily. 03/30/19  Martyn Ehrich, NP  folic acid (FOLVITE) 1 MG tablet Take 2 tablets (2 mg total) by mouth daily. 11/18/20   Ofilia Neas, PA-C  hydrochlorothiazide (HYDRODIURIL) 25 MG tablet TAKE 1 TABLET BY MOUTH EVERY DAY 09/13/20   [name], [name], MD  KLOR-CON M20 20 MEQ tablet TAKE 1 TABLET BY MOUTH EVERY DAY 09/27/20   [name], [name], MD  lovastatin (MEVACOR) 20 MG tablet TAKE 1 TABLET BY MOUTH EVERYDAY AT BEDTIME 12/02/20   [name], [name], MD  methotrexate 50 MG/2ML injection Inject 0.4 mLs (10 mg total) into the skin once a week. 01/29/21   Bo Merino, MD  nystatin-triamcinolone (MYCOLOG II) cream Apply 1 application topically 2 (two) times daily. 01/24/20   Ofilia Neas, PA-C  Omega-3 Fatty Acids (FISH OIL) 1000 MG CAPS Take by mouth.    [provider]   pantoprazole (PROTONIX) 40 MG tablet TAKE 1 TABLET BY MOUTH DAILY BEFORE BREAKFAST 11/29/20   [name], [name], MD  predniSONE (DELTASONE) 5 MG tablet Take 2.5 mg by mouth daily.  04/05/18   [provider]  traZODone (DESYREL) 50 MG tablet Take 0.5-1 tablets (25-50 mg total) by mouth at bedtime as needed for sleep. 01/29/21   [name], [name], MD  Tuberculin-Allergy Syringes (ALLERGY SYRINGE 1CC/27GX1/2") 27G X 1/2" 1 ML MISC Patient to use to inject MTX weekly 08/21/16   Bo Merino, MD  valACYclovir (VALTREX) 500 MG tablet TAKE 1 TABLET (500 MG TOTAL) BY MOUTH 2 (TWO) TIMES DAILY. FOR 3 DAYS WITH ACUTE EPISODES. Patient taking differently: TAKE 1 TABLET (500 MG TOTAL) BY MOUTH 2 (TWO) TIMES DAILY. 04/17/19   [name], [name], MD  vitamin B-12 (CYANOCOBALAMIN) 100 MCG tablet Take 100 mcg by mouth daily.    [provider]  VITAMIN D, ERGOCALCIFEROL, PO Take 1 capsule by mouth daily.    [provider]  XELJANZ XR 11 MG TB24 Take 1 tablet by mouth daily. 03/02/18   [provider]  Zinc Citrate-Phytase (ZYTAZE) 25-500 MG CAPS Take by mouth.    [provider]      Allergies    Aspirin, Demerol [meperidine], Hydrocodone-acetaminophen, Ibuprofen, Imdur [isosorbide nitrate], Isosorbide dinitrate, Meperidine hcl, Morphine, Oxycodone-acetaminophen, Procaine hcl, Toprol xl [metoprolol], Tramadol, and Codeine    Review of Systems   Review of Systems  Constitutional:  Positive for diaphoresis and fatigue. Negative for chills and fever.  HENT:  Negative for congestion, rhinorrhea and sneezing.   Eyes: Negative.   Respiratory:  Positive for chest tightness and shortness of breath. Negative for cough.   Cardiovascular:  Negative for chest pain and leg swelling.  Gastrointestinal:  Negative for abdominal pain, blood in stool, diarrhea, nausea and vomiting.  Genitourinary:  Negative for difficulty urinating, flank pain, frequency and hematuria.   Musculoskeletal:  Negative for arthralgias and back pain.  Skin:  Negative for rash.  Neurological:  Negative for dizziness, speech difficulty, weakness, numbness and headaches.   Physical Exam Updated Vital Signs BP 129/79    Pulse 84    Temp 98.3 F (36.8 C) (Oral)    Resp 16    Ht 5\' 5"  (1.651 m)    Wt 96.6 kg    SpO2 99%    BMI 35.45 kg/m  Physical Exam Constitutional:      Appearance: She is well-developed.  HENT:     Head: Normocephalic and atraumatic.  Eyes:     Pupils: Pupils are equal, round, and reactive to light.  Cardiovascular:  Rate and Rhythm: Normal rate and regular rhythm.     Heart sounds: Normal heart sounds.  Pulmonary:     Effort: Pulmonary effort is normal. No respiratory distress.     Breath sounds: Normal breath sounds. No wheezing or rales.  Chest:     Chest wall: No tenderness.  Abdominal:     General: Bowel sounds are normal.     Palpations: Abdomen is soft.     Tenderness: There is no abdominal tenderness. There is no guarding or rebound.  Musculoskeletal:        General: Normal range of motion.     Cervical back: Normal range of motion and neck supple.     Comments: No edema or calf tenderness  Lymphadenopathy:     Cervical: No cervical adenopathy.  Skin:    General: Skin is warm and dry.     Findings: No rash.  Neurological:     General: No focal deficit present.     Mental Status: She is alert and oriented to person, place, and time.    ED Results / Procedures / Treatments   Labs (all labs ordered are listed, but only abnormal results are displayed) Labs Reviewed  COMPREHENSIVE METABOLIC PANEL - Abnormal; Notable for the following components:      Result Value   Potassium 2.7 (*)    Total Protein 8.4 (*)    All other components within normal limits  CBG MONITORING, ED - Abnormal; Notable for the following components:   Glucose-Capillary 67 (*)    All other components within normal limits  RESP PANEL BY RT-PCR (FLU A&B, COVID)  ARPGX2  BRAIN NATRIURETIC PEPTIDE  LACTIC ACID, PLASMA  LACTIC ACID, PLASMA  CBC WITH DIFFERENTIAL/PLATELET  URINALYSIS, ROUTINE W REFLEX MICROSCOPIC  MAGNESIUM  CBG MONITORING, ED  TROPONIN I (HIGH SENSITIVITY)  TROPONIN I (HIGH SENSITIVITY)    EKG EKG Interpretation  Date/Time:  Sunday March 16 2021 15:40:19 EST Ventricular Rate:  98 PR Interval:  160 QRS Duration: 104 QT Interval:  362 QTC Calculation: 463 R Axis:   39 Text Interpretation: Sinus rhythm Abnormal R-wave progression, early transition Borderline repolarization abnormality since last tracing no significant change Confirmed by Malvin Johns [id number]) on 03/16/2021 3:43:56 PM  Radiology DG Chest 2 View  Result Date: 03/16/2021 CLINICAL DATA:  Shortness of breath and weakness. EXAM: CHEST - 2 VIEW COMPARISON:  12/15/2020 FINDINGS: The heart size and mediastinal contours are within normal limits. Both lungs are clear. The visualized skeletal structures are unremarkable. Remote cervical fusion. IMPRESSION: No active cardiopulmonary disease. Electronically Signed   By: Nolon Nations M.D.   On: 03/16/2021 17:22    Procedures Procedures    Medications Ordered in ED Medications  sodium chloride 0.9 % bolus 500 mL (0 mLs Intravenous Stopped 03/16/21 1813)  potassium chloride 10 mEq in 100 mL IVPB (0 mEq Intravenous Stopped 03/16/21 2033)  potassium chloride SA (KLOR-CON M) CR tablet 40 mEq (40 mEq Oral Given 03/16/21 1820)  0.9 %  sodium chloride infusion (0 mLs Intravenous Stopped 03/16/21 1932)    ED Course/ Medical Decision Making/ A&P                           Medical Decision Making Problems Addressed: Hypokalemia: acute illness or injury that poses a threat to life or bodily functions  Amount and/or Complexity of Data Reviewed Independent Historian: spouse External Data Reviewed: labs and notes. Labs: ordered. Decision-making details documented in ED  Course. Radiology: ordered and independent  interpretation performed. Decision-making details documented in ED Course. ECG/medicine tests: ordered and independent interpretation performed. Decision-making details documented in ED Course.  Risk Prescription drug management. Drug therapy requiring intensive monitoring for toxicity. Decision regarding hospitalization.   Patient is a 61 year old female who presents with general fatigue, sleepiness and shortness of breath.  She did recently increase some medication.  She said she had started 1 but I did contact the [facility] and she recently increased her Cymbalta from 20 mg to 30 mg.  Since this is happened, she has been very fatigued and sleepy.  She has no neurologic deficits.  She does not have any ischemic changes on EKG.  She has had a negative troponin.  Chest x-ray shows no acute abnormality.  No signs of fluid overload/CHF.  No evidence of pneumonia.  Her COVID/flu test are negative.  Her chest x-ray was interpreted by me.  Her labs show a normal hemoglobin.  Her potassium is markedly low at 2.7.  Magnesium level is normal.  She was given IV fluids as well as potassium replacement both IV and oral.  She feels much better after this.  She is fully alert and oriented.  She is able to ambulate without symptoms.  She has no shortness of breath or chest pain on ambulation.  Her urinalysis shows no evidence of infection.  She was discharged home in good condition.  Since she has had such improvement, I do not feel that this point she needs inpatient treatment.  She does take potassium at home at 20 mg.  She says she has been taking it.  I advised her to take 40 mg tomorrow and then go back to 20 mg.  I did advise her that she will need close follow-up with her PCP and will need a potassium check within the next few days.  Return precautions were given.  Her glucose was mildly low on arrival.  She was given orange juice.  Recheck on her blood work showed a glucose of 98.  Final Clinical  Impression(s) / ED Diagnoses Final diagnoses:  Other fatigue  Hypokalemia    Rx / DC Orders ED Discharge Orders     None         Malvin Johns, MD 03/16/21 2114    Malvin Johns, MD 03/16/21 2115

## 2021-03-16 NOTE — ED Notes (Signed)
Fingerstick glucose checked per patient request.  Patricia Reed crackers and cranberry juice provided.

## 2021-03-16 NOTE — ED Notes (Signed)
Patient discharged to home.  All discharge instructions reviewed.  Patient verbalized understanding via teachback method.  VS WDL.  Respirations even and unlabored.  Ambulatory out of ED.   °

## 2021-03-16 NOTE — ED Notes (Signed)
CBG 67 

## 2021-03-16 NOTE — ED Notes (Signed)
ED Provider at bedside. 

## 2021-03-18 NOTE — Progress Notes (Signed)
HPI: [name] is a 61 y.o. female, who is here today to follow on recent ED visit.  She was seen at the ED on 2/26; she went in for shortness of breath and fatigue. Work up showed hypokalemia with a potassium of 2.7. 2 weeks of fatigue, which was different that the type of fatigue she has had before.  She is feeling better. HypoK+ on KLOR 20 meq daily.  Lab Results  Component Value Date   CREATININE 0.93 03/16/2021   BUN 20 03/16/2021   NA 139 03/16/2021   K 2.7 (LL) 03/16/2021   CL 100 03/16/2021   CO2 28 03/16/2021  HTN on HCTZ 25 mg daily. Negative for unusual/frequent headache, CP,SOB,focal weakness,or edema.  Lab Results  Component Value Date   ALT 15 03/16/2021   AST 24 03/16/2021   ALKPHOS 55 03/16/2021   BILITOT 0.9 03/16/2021   Total protein mildly elevated at 8.4. Past hx of anemia and no hx of CKD.  Lab Results  Component Value Date   WBC 4.3 03/16/2021   HGB 13.9 03/16/2021   HCT 43.9 03/16/2021   MCV 93.4 03/16/2021   PLT 299 03/16/2021   She has been checking her BS's at home, 80's-108. Concerned because her glucose was 67 in the ED. Negative for polydipsia,polyuria, or polyphagia.  Lab Results  Component Value Date   HGBA1C 6.2 08/20/2020   Fibromyalgia: Follows with rheumatologist. She is now on Duloxetine 30 mg bid, which has really helped with myalgia. Prednisone was discontinued.  She has had fatigue for long time. Last visit, 10/30/20, sleep study was recommended. She has not received a call with information. She is sleeping better since Prednisone was discontinued. Still husband c/o loud snoring. She takes Trazodone 50 mg sometimes.  Review of Systems  Constitutional:  Negative for activity change, appetite change and fever.  HENT:  Negative for mouth sores, nosebleeds and trouble swallowing.   Eyes:  Negative for redness and visual disturbance.  Respiratory:  Negative for cough and wheezing.   Gastrointestinal:   Negative for abdominal pain, nausea and vomiting.       Negative for changes in bowel habits.  Genitourinary:  Negative for decreased urine volume, dysuria and hematuria.  Skin:  Negative for rash.  Neurological:  Negative for syncope, facial asymmetry and weakness.  Rest see pertinent positives and negatives per HPI.  Current Outpatient Medications on File Prior to Visit  Medication Sig Dispense Refill   acetaminophen (TYLENOL) 650 MG CR tablet Take 650 mg by mouth every 8 (eight) hours as needed for pain.     albuterol (PROVENTIL) (2.5 MG/3ML) 0.083% nebulizer solution Take 3 mLs (2.5 mg total) by nebulization every 6 (six) hours as needed for wheezing or shortness of breath. 75 mL 12   Ascorbic Acid (VITAMIN C PO) Take by mouth.     B-D INS SYR ULTRAFINE 1CC/30G 30G X 1/2" 1 ML MISC      baclofen (LIORESAL) 10 MG tablet TAKE 1 TABLET BY MOUTH AT BEDTIME AS NEEDED FOR MUSCLE SPASMS 90 tablet 1   CALCIUM PO Take by mouth daily.     Cholecalciferol 25 MCG (1000 UT) capsule Take by mouth.     diclofenac sodium (VOLTAREN) 1 % GEL Apply 3 g to 3 large joints up to 3 times daily. (Patient taking differently: Apply 2 g topically daily as needed (pain).) 3 Tube 3   DULoxetine (CYMBALTA) 30 MG capsule Take 30 mg by mouth 2 (two) times daily.  Estradiol 10 MCG TABS vaginal tablet Yuvafem 10 mcg vaginal tablet     famotidine (PEPCID) 20 MG tablet TAKE 1 TO 2 TABLETS BY MOUTH AT BEDTIME 180 tablet 1   fluticasone furoate-vilanterol (BREO ELLIPTA) 100-25 MCG/INH AEPB Inhale 1 puff into the lungs daily. 28 each 0   folic acid (FOLVITE) 1 MG tablet Take 2 tablets (2 mg total) by mouth daily. 180 tablet 3   hydrochlorothiazide (HYDRODIURIL) 25 MG tablet TAKE 1 TABLET BY MOUTH EVERY DAY 90 tablet 3   KLOR-CON M20 20 MEQ tablet TAKE 1 TABLET BY MOUTH EVERY DAY 90 tablet 3   lovastatin (MEVACOR) 20 MG tablet TAKE 1 TABLET BY MOUTH EVERYDAY AT BEDTIME 90 tablet 1   methotrexate 50 MG/2ML injection Inject  0.4 mLs (10 mg total) into the skin once a week. 5 mL 0   nystatin-triamcinolone (MYCOLOG II) cream Apply 1 application topically 2 (two) times daily. 60 g 0   Omega-3 Fatty Acids (FISH OIL) 1000 MG CAPS Take by mouth.     pantoprazole (PROTONIX) 40 MG tablet TAKE 1 TABLET BY MOUTH DAILY BEFORE BREAKFAST 90 tablet 1   predniSONE (DELTASONE) 5 MG tablet Take 2.5 mg by mouth daily.      traZODone (DESYREL) 50 MG tablet Take 0.5-1 tablets (25-50 mg total) by mouth at bedtime as needed for sleep. 90 tablet 1   Tuberculin-Allergy Syringes (ALLERGY SYRINGE 1CC/27GX1/2") 27G X 1/2" 1 ML MISC Patient to use to inject MTX weekly 12 each 3   valACYclovir (VALTREX) 500 MG tablet TAKE 1 TABLET (500 MG TOTAL) BY MOUTH 2 (TWO) TIMES DAILY. FOR 3 DAYS WITH ACUTE EPISODES. (Patient taking differently: TAKE 1 TABLET (500 MG TOTAL) BY MOUTH 2 (TWO) TIMES DAILY.) 18 tablet 1   vitamin B-12 (CYANOCOBALAMIN) 100 MCG tablet Take 100 mcg by mouth daily.     VITAMIN D, ERGOCALCIFEROL, PO Take 1 capsule by mouth daily.     XELJANZ XR 11 MG TB24 Take 1 tablet by mouth daily.     Zinc Citrate-Phytase (ZYTAZE) 25-500 MG CAPS Take by mouth.     No current facility-administered medications on file prior to visit.   Past Medical History:  Diagnosis Date   Allergy    Anemia    Asthma    Blood transfusion without reported diagnosis    Fibromyalgia    GERD (gastroesophageal reflux disease)    Heart murmur    High cholesterol    History of stomach ulcers    Hypertension    Lupus (HCC)    MI, old    Osteoarthritis    Rheumatoid arthritis ([place])    Thyroid disease    Thyroid mass    Vertigo    Allergies  Allergen Reactions   Aspirin Other (See Comments)    GI bleed   Demerol [Meperidine]    Hydrocodone-Acetaminophen Hives and Nausea And Vomiting   Ibuprofen Other (See Comments)    GI bleed   Imdur [Isosorbide Nitrate] Other (See Comments)    Reaction unknown   Isosorbide Dinitrate     Other reaction(s):  Other (see comments) Other reaction(s): Other (See Comments) Reaction unknown Reaction unknown   Meperidine Hcl Hives and Nausea And Vomiting    Short term memory loss   Morphine Hives and Nausea And Vomiting   Oxycodone-Acetaminophen Hives and Nausea And Vomiting    Reaction unknown   Procaine Hcl Other (See Comments)    Ineffective   Toprol Xl [Metoprolol] Other (See Comments)  Reaction unknown   Tramadol Itching   Codeine Nausea Only    Other reaction(s): Other (see comments)    Social History   Socioeconomic History   Marital status: Married    Spouse name: Not on file   Number of children: 4   Years of education: Not on file   Highest education level: Not on file  Occupational History   Occupation: TEACHER ASSISTANT    Employer: [employer]  Tobacco Use   Smoking status: Never   Smokeless tobacco: Never  Vaping Use   Vaping Use: Never used  Substance and Sexual Activity   Alcohol use: Not Currently    Comment: 1 glass every 3 months   Drug use: No   Sexual activity: Yes    Birth control/protection: None, Surgical    Comment: LAVH  Other Topics Concern   Not on file  Social History Narrative   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     Social Determinants of Health   Financial Resource Strain: Not on file  Food Insecurity: Not on file  Transportation Needs: Not on file  Physical Activity: Not on file  Stress: Not on file  Social Connections: Not on file   Vitals:   03/19/21 1442  BP: 126/80  Pulse: 94  Resp: 16  SpO2: 99%   Body mass index is 35.44 kg/m.  Physical Exam Vitals and nursing note reviewed.  Constitutional:      General: She is not in acute distress.    Appearance: She is well-developed.  HENT:     Head: Normocephalic and atraumatic.     Mouth/Throat:     Mouth: Mucous membranes are moist.     Pharynx: Oropharynx is clear.  Eyes:     Conjunctiva/sclera: Conjunctivae normal.   Cardiovascular:     Rate and Rhythm: Normal rate and regular rhythm.     Pulses:          Dorsalis pedis pulses are 2+ on the right side and 2+ on the left side.     Heart sounds: No murmur heard. Pulmonary:     Effort: Pulmonary effort is normal. No respiratory distress.     Breath sounds: Normal breath sounds.  Abdominal:     Palpations: Abdomen is soft. There is no hepatomegaly or mass.     Tenderness: There is no abdominal tenderness.  Lymphadenopathy:     Cervical: No cervical adenopathy.  Skin:    General: Skin is warm.     Findings: No erythema or rash.  Neurological:     General: No focal deficit present.     Mental Status: She is alert and oriented to person, place, and time.     Cranial Nerves: No cranial nerve deficit.     Gait: Gait normal.  Psychiatric:     Comments: Well groomed, good eye contact.   ASSESSMENT AND PLAN:  [name] was seen today for hospitalization follow-up.  Diagnoses and all orders for this visit: Orders Placed This Encounter  Procedures   Basic Metabolic Panel   Protein Electrophoresis,Random Urn   Potassium   POC HgB A1c   Lab Results  Component Value Date   CREATININE 0.84 03/19/2021   BUN 14 03/19/2021   NA 141 03/19/2021   K 3.3 (L) 03/19/2021   CL 101 03/19/2021   CO2 33 (H) 03/19/2021   Lab Results  Component Value Date   HGBA1C 6.1 03/19/2021   Elevated total protein Mild.  Could be related to dehydration. Urine electrophoresis ordered today.  Insomnia, unspecified type Problem has improved. Continue good sleep hygiene. Trazodone 50 mg 1/2-1 tab at bedtime as needed. We discussed side effects and risk of interaction with some of her meds.  HTN (hypertension) BP adequately controlled. Continue HCTZ 25 mg daily and low salt diet. If hypoK+ is persistent we may need to consider changing to a different antihypertensive agent.  Hypokalemia Problem has been adequately controlled until recently. For now continue  KLOR 20 meq daily. HCTZ side effects discussed. Further recommendations according to lab results.  Prediabetes HgA1C stable. She is not longer on daily Prednisone. A healthy life style encouraged for diabetes prevention.   Chronic fatigue Mild improvement after starting Duloxetine for fibromyalgia. Contact information of pulmonologist office given,so she can call and schedule sleep study.   Return in about 6 months (around 09/19/2021).  Sakara Lehtinen G. [name], MD  [facility]. [place] office.

## 2021-03-19 ENCOUNTER — Encounter: Payer: Self-pay | Admitting: Family Medicine

## 2021-03-19 ENCOUNTER — Ambulatory Visit (INDEPENDENT_AMBULATORY_CARE_PROVIDER_SITE_OTHER): Payer: BC Managed Care – PPO | Admitting: Family Medicine

## 2021-03-19 VITALS — BP 126/80 | HR 94 | Resp 16 | Ht 65.0 in | Wt 213.0 lb

## 2021-03-19 DIAGNOSIS — G4700 Insomnia, unspecified: Secondary | ICD-10-CM

## 2021-03-19 DIAGNOSIS — E876 Hypokalemia: Secondary | ICD-10-CM | POA: Diagnosis not present

## 2021-03-19 DIAGNOSIS — R778 Other specified abnormalities of plasma proteins: Secondary | ICD-10-CM

## 2021-03-19 DIAGNOSIS — R5382 Chronic fatigue, unspecified: Secondary | ICD-10-CM

## 2021-03-19 DIAGNOSIS — I10 Essential (primary) hypertension: Secondary | ICD-10-CM

## 2021-03-19 DIAGNOSIS — R7303 Prediabetes: Secondary | ICD-10-CM

## 2021-03-19 LAB — BASIC METABOLIC PANEL
BUN: 14 mg/dL (ref 6–23)
CO2: 33 mEq/L — ABNORMAL HIGH (ref 19–32)
Calcium: 9.5 mg/dL (ref 8.4–10.5)
Chloride: 101 mEq/L (ref 96–112)
Creatinine, Ser: 0.84 mg/dL (ref 0.40–1.20)
GFR: 76.36 mL/min (ref >60.00)
Glucose, Bld: 78 mg/dL (ref 70–99)
Potassium: 3.3 mEq/L — ABNORMAL LOW (ref 3.5–5.1)
Sodium: 141 mEq/L (ref 135–145)

## 2021-03-19 LAB — POCT GLYCOSYLATED HEMOGLOBIN (HGB A1C): HbA1c, POC (prediabetic range): 6.1 % (ref 5.7–6.4)

## 2021-03-19 NOTE — Assessment & Plan Note (Signed)
HgA1C stable. ?She is not longer on daily Prednisone. ?A healthy life style encouraged for diabetes prevention. ? ?

## 2021-03-19 NOTE — Assessment & Plan Note (Signed)
Mild improvement after starting Duloxetine for fibromyalgia. ?Contact information of pulmonologist office given,so she can call and schedule sleep study. ?

## 2021-03-19 NOTE — Assessment & Plan Note (Addendum)
Problem has been adequately controlled until recently. For now continue KLOR 20 meq daily. ?HCTZ side effects discussed. ?Further recommendations according to lab results. ?

## 2021-03-19 NOTE — Assessment & Plan Note (Addendum)
BP adequately controlled. ?Continue HCTZ 25 mg daily and low salt diet. ?If hypoK+ is persistent we may need to consider changing to a different antihypertensive agent. ?

## 2021-03-19 NOTE — Patient Instructions (Addendum)
A few things to remember from today's visit: ? ?Chronic fatigue ? ?Hypokalemia - Plan: Basic Metabolic Panel, Basic Metabolic Panel ? ?Elevated total protein - Plan: Protein Electrophoresis,Random Urn, Protein Electrophoresis,Random Urn ? ?If you need refills please call your pharmacy. ?Do not use My Chart to request refills or for acute issues that need immediate attention. ?  ?You can call to have sleep study arrange:336 [phone]  ? ?Please be sure medication list is accurate. ?If a new problem present, please set up appointment sooner than planned today. ? ?Continue potassium supplementation. ?Continue Trazodone for sleep. ? ? ? ? ?

## 2021-03-20 ENCOUNTER — Encounter: Payer: Self-pay | Admitting: Adult Health

## 2021-03-20 ENCOUNTER — Ambulatory Visit (INDEPENDENT_AMBULATORY_CARE_PROVIDER_SITE_OTHER): Payer: BC Managed Care – PPO | Admitting: Adult Health

## 2021-03-20 ENCOUNTER — Other Ambulatory Visit: Payer: Self-pay

## 2021-03-20 VITALS — BP 110/76 | HR 85 | Temp 98.0°F | Ht 65.0 in | Wt 214.0 lb

## 2021-03-20 DIAGNOSIS — R400 Somnolence: Secondary | ICD-10-CM

## 2021-03-20 DIAGNOSIS — J4520 Mild intermittent asthma, uncomplicated: Secondary | ICD-10-CM

## 2021-03-20 DIAGNOSIS — Z6837 Body mass index (BMI) 37.0-37.9, adult: Secondary | ICD-10-CM | POA: Diagnosis not present

## 2021-03-20 DIAGNOSIS — E669 Obesity, unspecified: Secondary | ICD-10-CM

## 2021-03-20 NOTE — Assessment & Plan Note (Signed)
Under good control  ? ?Albuterol inhaler As needed   ?

## 2021-03-20 NOTE — Assessment & Plan Note (Signed)
Healthy weight loss 

## 2021-03-20 NOTE — Patient Instructions (Addendum)
Set up for home sleep study ?Healthy sleep regimen ?Do not drive if sleepy ?Work on healthy weight loss ?Albuterol inhaler As needed   ?Follow-up in 6 weeks to discuss test results and possible treatment plan ?

## 2021-03-20 NOTE — Progress Notes (Signed)
  ID: Patricia Reed, female    DOB: June 23, 1960, 61 y.o.   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  Previously on chronic steroids , weaned off January 2023.   TEST/EVENTS :   03/20/2021 Sleep consult  Patient presents for a sleep consult.  Referred over for primary care for complaints of daytime sleepiness and restless sleep for 7 years.  Has some intermittent snoring. Patient says she goes to bed typically about 9 PM.  Takes a long time to fall asleep.  Is up 2-3 times at night.  Gets up about 7:30 AM.  She says she typically only gets about 4 hours of sleep each night.  She does not operate heavy machinery.  Weight is actually down 35 pounds over the last 2 years.  She is never had a sleep study.,  Caffeine intake -loves coffee - 3 cups .  No symptoms suspicious for cataplexy or sleep paralysis.  Epworth score is 3.  Mainly sleepiness with inactivity. No history of congestive heart failure or stroke.  Does have trazodone at bedtime to use for sleep. Does not take on regular basis . Occasional use of baclofen.  No pain meds.   Patient does have Asthma says its been under good control.  Has Breo to use if she has asthma flare symptoms.  She denies any increased albuterol use.    Allergies  Allergen Reactions   Aspirin Other (See Comments)    GI bleed   Hydrocodone-Acetaminophen Hives and Nausea And Vomiting   Meperidine Hcl Hives and Nausea And Vomiting    Short term memory loss   Morphine  Hives and Nausea And Vomiting   Oxycodone-Acetaminophen Hives and Nausea And Vomiting    Reaction unknown   Ibuprofen Other (See Comments)    GI bleed   Codeine Nausea Only    Other reaction(s): Other (see comments)   Demerol [Meperidine] Nausea Only   Imdur [Isosorbide Nitrate] Other (See Comments)    Reaction unknown   Isosorbide Dinitrate Other (See Comments)    Other reaction(s): Other (see comments) Other reaction(s): Other (See Comments) Reaction unknown Reaction unknown   Procaine Hcl Other (See Comments)    Ineffective   Toprol Xl [Metoprolol] Other (See Comments)    Reaction unknown   Tramadol Itching    Immunization History  Administered Date(s) Administered   Influenza Inj Mdck Quad Pf 10/22/2020   Influenza Split 11/04/2015, 10/11/2017, 10/21/2018   Influenza, Quadrivalent, Recombinant, Inj, Pf 10/11/2017   Influenza,inj,Quad PF,6+ Mos 11/04/2015, 10/21/2018   Influenza-Unspecified 10/11/2017   PFIZER Comirnaty(Gray Top)Covid-19 Tri-Sucrose Vaccine 05/10/2020   PFIZER(Purple Top)SARS-COV-2 Vaccination 03/20/2019, 04/12/2019, 12/07/2019, 05/10/2020   PNEUMOCOCCAL CONJUGATE-20 10/22/2020   Pneumococcal Polysaccharide-23 01/09/2020   Tdap 08/20/2020   Zoster Recombinat (Shingrix) 10/11/2017, 12/25/2017, 01/09/2020   Zoster, Unspecified 10/11/2017, 12/25/2017    Past Medical History:  Diagnosis Date   Allergy    Anemia    Asthma    Blood transfusion without reported diagnosis    Fibromyalgia    GERD (gastroesophageal reflux disease)    Heart murmur    High cholesterol  History of stomach ulcers    Hypertension    Lupus (HCC)    MI, old    Osteoarthritis    Rheumatoid arthritis (HCC)    Thyroid disease    Thyroid mass    Vertigo     Tobacco History: Social History   Tobacco Use  Smoking Status Never  Smokeless Tobacco Never   Counseling given: Not Answered   Outpatient Medications Prior to Visit  Medication Sig Dispense Refill    acetaminophen (TYLENOL) 650 MG CR tablet Take 650 mg by mouth every 8 (eight) hours as needed for pain.     albuterol (PROVENTIL) (2.5 MG/3ML) 0.083% nebulizer solution Take 3 mLs (2.5 mg total) by nebulization every 6 (six) hours as needed for wheezing or shortness of breath. 75 mL 12   Ascorbic Acid (VITAMIN C PO) Take by mouth.     B-D INS SYR ULTRAFINE 1CC/30G 30G X 1/2" 1 ML MISC      baclofen (LIORESAL) 10 MG tablet TAKE 1 TABLET BY MOUTH AT BEDTIME AS NEEDED FOR MUSCLE SPASMS 90 tablet 1   CALCIUM PO Take by mouth daily.     Cholecalciferol 25 MCG (1000 UT) capsule Take by mouth.     diclofenac sodium (VOLTAREN) 1 % GEL Apply 3 g to 3 large joints up to 3 times daily. (Patient taking differently: Apply 2 g topically daily as needed (pain).) 3 Tube 3   DULoxetine (CYMBALTA) 30 MG capsule Take 30 mg by mouth 2 (two) times daily.     Estradiol 10 MCG TABS vaginal tablet Yuvafem 10 mcg vaginal tablet     famotidine (PEPCID) 20 MG tablet TAKE 1 TO 2 TABLETS BY MOUTH AT BEDTIME 180 tablet 1   fluticasone furoate-vilanterol (BREO ELLIPTA) 100-25 MCG/INH AEPB Inhale 1 puff into the lungs daily. 28 each 0   folic acid (FOLVITE) 1 MG tablet Take 2 tablets (2 mg total) by mouth daily. 180 tablet 3   hydrochlorothiazide (HYDRODIURIL) 25 MG tablet TAKE 1 TABLET BY MOUTH EVERY DAY 90 tablet 3   KLOR-CON M20 20 MEQ tablet TAKE 1 TABLET BY MOUTH EVERY DAY 90 tablet 3   lovastatin (MEVACOR) 20 MG tablet TAKE 1 TABLET BY MOUTH EVERYDAY AT BEDTIME 90 tablet 1   methotrexate 50 MG/2ML injection Inject 0.4 mLs (10 mg total) into the skin once a week. 5 mL 0   nystatin-triamcinolone (MYCOLOG II) cream Apply 1 application topically 2 (two) times daily. 60 g 0   Omega-3 Fatty Acids (FISH OIL) 1000 MG CAPS Take by mouth.     pantoprazole (PROTONIX) 40 MG tablet TAKE 1 TABLET BY MOUTH DAILY BEFORE BREAKFAST 90 tablet 1   traZODone (DESYREL) 50 MG tablet Take 0.5-1 tablets (25-50 mg total) by mouth at bedtime as  needed for sleep. 90 tablet 1   Tuberculin-Allergy Syringes (ALLERGY SYRINGE 1CC/27GX1/2") 27G X 1/2" 1 ML MISC Patient to use to inject MTX weekly 12 each 3   valACYclovir (VALTREX) 500 MG tablet TAKE 1 TABLET (500 MG TOTAL) BY MOUTH 2 (TWO) TIMES DAILY. FOR 3 DAYS WITH ACUTE EPISODES. (Patient taking differently: TAKE 1 TABLET (500 MG TOTAL) BY MOUTH 2 (TWO) TIMES DAILY.) 18 tablet 1   vitamin B-12 (CYANOCOBALAMIN) 100 MCG tablet Take 100 mcg by mouth daily.     VITAMIN D, ERGOCALCIFEROL, PO Take 1 capsule by mouth daily.     XELJANZ XR 11 MG TB24 Take 1 tablet by mouth daily.     Zinc Citrate-Phytase (ZYTAZE) 25-500 MG  CAPS Take by mouth.     predniSONE (DELTASONE) 5 MG tablet Take 2.5 mg by mouth daily.  (Patient not taking: Reported on 03/20/2021)     No facility-administered medications prior to visit.     Review of Systems:   Constitutional:   No  weight loss, night sweats,  Fevers, chills,  +fatigue, or  lassitude.  HEENT:   No headaches,  Difficulty swallowing,  Tooth/dental problems, or  Sore throat,                No sneezing, itching, ear ache, nasal congestion, post nasal drip,   CV:  No chest pain,  Orthopnea, PND, swelling in lower extremities, anasarca, dizziness, palpitations, syncope.   GI  No heartburn, indigestion, abdominal pain, nausea, vomiting, diarrhea, change in bowel habits, loss of appetite, bloody stools.   Resp: No shortness of breath with exertion or at rest.  No excess mucus, no productive cough,  No non-productive cough,  No coughing up of blood.  No change in color of mucus.  No wheezing.  No chest wall deformity  Skin: no rash or lesions.  GU: no dysuria, change in color of urine, no urgency or frequency.  No flank pain, no hematuria   MS:  chronic joint pain     Physical Exam  BP 110/76 (BP Location: Left Arm, Patient Position: Sitting, Cuff Size: Normal)    Pulse 85    Temp 98 F (36.7 C) (Oral)    Ht  (1.651 m)    Wt 214 lb (97.1 kg)     SpO2 96%    BMI 35.61 kg/m   GEN: A/Ox3; pleasant , NAD, well nourished    HEENT:  [place]/AT,  EACs-clear, TMs-wnl, NOSE-clear, THROAT-clear, no lesions, no postnasal drip or exudate noted. Class 2-3 MP airway   NECK:  Supple w/ fair ROM; no JVD; normal carotid impulses w/o bruits; no thyromegaly or nodules palpated; no lymphadenopathy.    RESP  Clear  P & A; w/o, wheezes/ rales/ or rhonchi. no accessory muscle use, no dullness to percussion  CARD:  RRR, no m/r/g, no peripheral edema, pulses intact, no cyanosis or clubbing.  GI:   Soft & nt; nml bowel sounds; no organomegaly or masses detected.   Musco: Warm bil, no deformities or joint swelling noted.   Neuro: alert, no focal deficits noted.    Skin: Warm, no lesions or rashes    Lab Results:  CBC    BNP   Imaging:     PFT Results Latest Ref Rng & Units 06/21/2019 03/30/2017  FVC-Pre L 2.80 2.89  FVC-Predicted Pre % 98 99  FVC-Post L 2.72 2.95  FVC-Predicted Post % 95 101  Pre FEV1/FVC % % 86 87  Post FEV1/FCV % % 90 90  FEV1-Pre L 2.42 2.51  FEV1-Predicted Pre % 107 109  FEV1-Post L 2.44 2.66  DLCO uncorrected ml/min/mmHg 17.56 17.54  DLCO UNC% % 82 68  DLCO corrected ml/min/mmHg 18.34 17.88  DLCO COR %Predicted % 86 69  DLVA Predicted % 109 88  TLC L 3.64 4.08  TLC % Predicted % 69 78  RV % Predicted % 43 57    No results found for: NITRICOXIDE      Assessment & Plan:   Daytime sleepiness Daytime sleepiness, snoring , restless sleep suspicious for OSA  - discussed how weight can impact sleep and risk for sleep disordered breathing - discussed options to assist with weight loss: combination of diet modification, cardiovascular and  strength training exercises   - had an extensive discussion regarding the adverse health consequences related to untreated sleep disordered breathing - specifically discussed the risks for hypertension, coronary artery disease, cardiac dysrhythmias, cerebrovascular disease,  and diabetes - lifestyle modification discussed   - discussed how sleep disruption can increase risk of accidents, particularly when driving - safe driving practices were discussed    Set up for Home sleep study  Plan  Patient Instructions  Set up for home sleep study Healthy sleep regimen Do not drive if sleepy Work on healthy weight loss Albuterol inhaler As needed   Follow-up in 6 weeks to discuss test results and possible treatment plan     Class 2 obesity with body mass index (BMI) of 37.0 to 37.9 in adult Healthy weight loss   Mild intermittent asthma Under good control   Albuterol inhaler As needed       Rubye Oaks, NP 03/20/2021

## 2021-03-20 NOTE — Assessment & Plan Note (Signed)
Daytime sleepiness, snoring , restless sleep suspicious for OSA  ?- discussed how weight can impact sleep and risk for sleep disordered breathing ?- discussed options to assist with weight loss: combination of diet modification, cardiovascular and strength training exercises ?  ?- had an extensive discussion regarding the adverse health consequences related to untreated sleep disordered breathing ?- specifically discussed the risks for hypertension, coronary artery disease, cardiac dysrhythmias, cerebrovascular disease, and diabetes ?- lifestyle modification discussed ?  ?- discussed how sleep disruption can increase risk of accidents, particularly when driving ?- safe driving practices were discussed ?  ? ?Set up for Home sleep study ? ?Plan  ?Patient Instructions  ?Set up for home sleep study ?Healthy sleep regimen ?Do not drive if sleepy ?Work on healthy weight loss ?Albuterol inhaler As needed   ?Follow-up in 6 weeks to discuss test results and possible treatment plan ?  ? ?

## 2021-03-24 LAB — PROTEIN ELECTROPHORESIS,RANDOM URN
Creatinine, Urine: 192 mg/dL (ref 20–275)
Protein/Creat Ratio: 36 mg/g creat (ref 24–184)
Protein/Creatinine Ratio: 0.036 mg/mg creat (ref 0.024–0.184)
Total Protein, Urine: 7 mg/dL (ref 5–24)

## 2021-05-07 ENCOUNTER — Other Ambulatory Visit: Payer: Self-pay | Admitting: Physician Assistant

## 2021-05-07 NOTE — Telephone Encounter (Signed)
Reached out to patient to schedule follow up appointment. Patient states she will call back to schedule. Patient states she is aware she is past due for labs.  ?

## 2021-05-07 NOTE — Telephone Encounter (Signed)
Next Visit: Due February 2023. Message sent to the front to schedule patient.  ? ?Last Visit: 11/25/2020 ? ?DX: Mixed connective tissue disease  ? ?Current Dose per lab note 01/28/2021: White cell count is low and stable.  Patient is clinically doing well.  She may reduce the dose of methotrexate to 0.4 mL subcu weekly.  ? ?Labs: 03/16/2021 CBC WNL 03/19/2021 Potassium 3.3, CO2 33 ? ?Okay to refill MTX?  ?

## 2021-05-07 NOTE — Telephone Encounter (Signed)
Please schedule patient for a follow up visit. Patient was due February 2023. Thanks!  °

## 2021-05-08 ENCOUNTER — Other Ambulatory Visit: Payer: Self-pay | Admitting: *Deleted

## 2021-05-08 DIAGNOSIS — Z79899 Other long term (current) drug therapy: Secondary | ICD-10-CM

## 2021-05-08 NOTE — Progress Notes (Addendum)
? ?Office Visit Note ? ?[name]             ?Date of Birth: 04/28/1960           ?MRN: [id number]             ?PCP: [name], [name], MD ?Referring: [name], [name], MD ?Visit Date: 05/15/2021 ?Occupation: @GUAROCC @ ? ?Subjective:  ?Left knee joint pain  ? ?History of Present Illness: Patricia Reed is a 61 y.o. female with history of mixed connective tissue disease, fibromyalgia, and osteoarthritis. She is taking Xeljanz 11 mg XR by mouth daily, MTX 0.4 ml sq once weekly (reduced dose low WBC count), and folic acid 2 mg daily.  She continues to tolerate combination therapy without any side effects and has not missed any doses recently.  She states that she was traveling this past weekend and was in [place].  She states that on Saturday she started to have significant discomfort and swelling in the left knee.  She was having to use a crutch to help her ambulate.  She denies any injury or fall prior to the onset of symptoms.  She states that she is also started to have increased pain in the right knee from compensating while walking.  She is also having increased discomfort in both shoulder joints.  Patient would like her handicap placard renewed today. ?She has establish care with pain management and was started on Cymbalta several months ago which she has been tolerating without any side effects.  She is going to be scheduled for a sleep study soon. ?She denies any recent infections. ? ? ?Activities of Daily Living:  ?Patient reports morning stiffness for 2 hours  ?Patient Reports nocturnal pain.  ?Difficulty dressing/grooming: Denies ?Difficulty climbing stairs: Reports ?Difficulty getting out of chair: Reports ?Difficulty using hands for taps, buttons, cutlery, and/or writing: Denies ? ?Review of Systems  ?Constitutional:  Positive for fatigue.  ?HENT:  Positive for mouth sores and mouth dryness. Negative for nose dryness.   ?Eyes:  Negative for pain, visual disturbance and dryness.   ?Respiratory:  Negative for cough, hemoptysis, shortness of breath and difficulty breathing.   ?Cardiovascular:  Negative for chest pain, palpitations, hypertension and swelling in legs/feet.  ?Gastrointestinal:  Positive for constipation. Negative for blood in stool and diarrhea.  ?Endocrine: Negative for increased urination.  ?Genitourinary:  Negative for painful urination.  ?Musculoskeletal:  Positive for joint pain, joint pain, joint swelling, myalgias, morning stiffness, muscle tenderness and myalgias. Negative for muscle weakness.  ?Skin:  Positive for color change and rash. Negative for pallor, hair loss, nodules/bumps, skin tightness, ulcers and sensitivity to sunlight.  ?Allergic/Immunologic: Negative for susceptible to infections.  ?Neurological:  Negative for dizziness, numbness, headaches and weakness.  ?Hematological:  Negative for swollen glands.  ?Psychiatric/Behavioral:  Positive for sleep disturbance (Trazodone). Negative for depressed mood. The patient is not nervous/anxious.   ? ?PMFS History:  ?Patient Active Problem List  ? Diagnosis Date Noted  ? Daytime sleepiness 03/20/2021  ? Prediabetes 03/19/2021  ? Hypokalemia 07/07/2019  ? Precordial chest pain 05/04/2019  ? Educated about COVID-19 virus infection 05/04/2019  ? Persistent vomiting   ? Abdominal pain, epigastric   ? Nausea & vomiting 03/11/2019  ? Gastritis   ? Nausea without vomiting 02/28/2019  ? Class 2 obesity with body mass index (BMI) of 37.0 to 37.9 in adult 11/25/2018  ? Mixed connective tissue disease ([place]) 08/14/2017  ? Recurrent genital herpes 05/12/2017  ? Shortness of  breath 04/15/2017  ? Dizziness 02/04/2017  ? Situational anxiety 06/08/2016  ? Atypical chest pain 06/08/2016  ? Trapezius muscle spasm 04/23/2016  ? Fibromyalgia 02/13/2016  ? Primary insomnia 02/13/2016  ? Chronic fatigue 02/13/2016  ? Vitamin D deficiency 11/25/2015  ? Autoimmune disease ([place]) 11/23/2015  ? High risk medication use 11/23/2015  ? Colon cancer  screening 01/25/2014  ? Anterior neck pain 12/01/2013  ? Inflammatory arthritis 08/18/2012  ? Allergic rhinitis 06/23/2012  ? HTN (hypertension) 06/23/2012  ? Hyperlipidemia 06/23/2012  ? Thyroid nodule - benign 06/23/2012  ? Arthralgia 06/23/2012  ? Mild intermittent asthma   ? Sickle cell trait ([place])   ? History of stomach ulcers   ? ESOPHAGEAL STRICTURE 10/04/2007  ? GERD 10/04/2007  ? Atrophic 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  ? Dysphagia, pharyngoesophageal phase 10/04/2007  ?  ?Past Medical History:  ?Diagnosis Date  ? Allergy   ? Anemia   ? Asthma   ? Blood transfusion without reported diagnosis   ? Fibromyalgia   ? GERD (gastroesophageal reflux disease)   ? Heart murmur   ? High cholesterol   ? History of stomach ulcers   ? Hypertension   ? Lupus ([place])   ? MI, old   ? Osteoarthritis   ? Rheumatoid arthritis ([place])   ? Thyroid disease   ? Thyroid mass   ? Vertigo   ?  ?Family History  ?Problem Relation Age of Onset  ? Alcohol abuse Father   ? Hypertension Father   ? Heart disease Father   ? Other Mother   ?     tobacco use disorder, refuses to see a doctor  ? Osteoporosis Sister   ? Colon cancer Maternal Uncle   ? Diabetes Maternal Grandmother   ? Healthy Son   ? Healthy Daughter   ? Multiple sclerosis Daughter   ? Colon polyps Neg Hx   ? Esophageal cancer Neg Hx   ? Kidney disease Neg Hx   ? Stomach cancer Neg Hx   ? Rectal cancer Neg Hx   ? ?Past Surgical History:  ?Procedure Laterality Date  ? ABDOMINAL HYSTERECTOMY    ? done for menorrhagia, ovaries remain  ? CERVICAL DISCECTOMY    ? plates and screws in neck  ? CESAREAN SECTION    ? COLONOSCOPY    ? ESOPHAGEAL MANOMETRY N/A 02/12/2014  ? Procedure: ESOPHAGEAL MANOMETRY (EM);  Surgeon: Inda Castle, MD;  Location: [facility];  Service: Endoscopy;  Laterality: N/A;  ? ESOPHAGOGASTRODUODENOSCOPY  03/04/2011  ? Procedure: ESOPHAGOGASTRODUODENOSCOPY (EGD);  Surgeon: Inda Castle, MD;  Location: [facility];  Service: Endoscopy;  Laterality: N/A;  BOTOX Injection  ?  ESOPHAGOGASTRODUODENOSCOPY (EGD) WITH PROPOFOL N/A 03/12/2019  ? Procedure: ESOPHAGOGASTRODUODENOSCOPY (EGD) WITH PROPOFOL;  Surgeon: Carol Ada, MD;  Location: [facility];  Service: Endoscopy;  Laterality: N/A;  ? exc benign breast lump    ? TONSILLECTOMY    ? UPPER GASTROINTESTINAL ENDOSCOPY    ? ?Social History  ? ?Social History Narrative  ? 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    ? ?Immunization History  ?Administered Date(s) Administered  ? Influenza Inj Mdck Quad Pf 10/22/2020  ? Influenza Split 11/04/2015, 10/11/2017, 10/21/2018  ? Influenza, Quadrivalent, Recombinant, Inj, Pf 10/11/2017  ? Influenza,inj,Quad PF,6+ Mos 11/04/2015, 10/21/2018  ? Influenza-Unspecified 10/11/2017  ? PFIZER Comirnaty(Gray Top)Covid-19 Tri-Sucrose Vaccine 05/10/2020  ? PFIZER(Purple Top)SARS-COV-2 Vaccination 03/20/2019, 04/12/2019, 12/07/2019, 05/10/2020  ? PNEUMOCOCCAL CONJUGATE-20 10/22/2020  ? Pneumococcal Polysaccharide-23 01/09/2020  ?  Tdap 08/20/2020  ? Zoster Recombinat (Shingrix) 10/11/2017, 12/25/2017, 01/09/2020  ? Zoster, Unspecified 10/11/2017, 12/25/2017  ?  ? ?Objective: ?Vital Signs: BP (!) 135/93 (BP Location: Right Arm, Patient Position: Sitting, Cuff Size: Small)   Pulse 85   Resp 12   Ht 5\' 5"  (1.651 m)   Wt 206 lb 9.6 oz (93.7 kg)   BMI 34.38 kg/m?   ? ?Physical Exam ?Vitals and nursing note reviewed.  ?Constitutional:   ?   Appearance: She is well-developed.  ?HENT:  ?   Head: Normocephalic and atraumatic.  ?Eyes:  ?   Conjunctiva/sclera: Conjunctivae normal.  ?Cardiovascular:  ?   Rate and Rhythm: Normal rate and regular rhythm.  ?   Heart sounds: Normal heart sounds.  ?Pulmonary:  ?   Effort: Pulmonary effort is normal.  ?   Breath sounds: Normal breath sounds.  ?Abdominal:  ?   General: Bowel sounds are normal.  ?   Palpations: Abdomen is soft.  ?Musculoskeletal:  ?   Cervical back: Normal range of motion.  ?Skin: ?   General: Skin is warm and dry.  ?    Capillary Refill: Capillary refill takes less than 2 seconds.  ?Neurological:  ?   Mental Status: She is alert and oriented to person, place, and time.  ?Psychiatric:     ?   Behavior: Behavior normal.  ?  ? ?Musc

## 2021-05-09 LAB — COMPLETE METABOLIC PANEL WITH GFR
AG Ratio: 1.3 (calc) (ref 1.0–2.5)
ALT: 24 U/L (ref 6–29)
AST: 20 U/L (ref 10–35)
Albumin: 4.4 g/dL (ref 3.6–5.1)
Alkaline phosphatase (APISO): 57 U/L (ref 37–153)
BUN: 14 mg/dL (ref 7–25)
CO2: 30 mmol/L (ref 20–32)
Calcium: 9.5 mg/dL (ref 8.6–10.4)
Chloride: 102 mmol/L (ref 98–110)
Creat: 0.82 mg/dL (ref 0.50–1.03)
Globulin: 3.4 g/dL (calc) (ref 1.9–3.7)
Glucose, Bld: 94 mg/dL (ref 65–99)
Potassium: 4.1 mmol/L (ref 3.5–5.3)
Sodium: 142 mmol/L (ref 135–146)
Total Bilirubin: 0.8 mg/dL (ref 0.2–1.2)
Total Protein: 7.8 g/dL (ref 6.1–8.1)
eGFR: 82 mL/min/{1.73_m2} (ref >=60)

## 2021-05-09 LAB — CBC WITH DIFFERENTIAL/PLATELET
Absolute Monocytes: 298 cells/uL (ref 200–950)
Basophils Absolute: 29 cells/uL (ref 0–200)
Basophils Relative: 0.7 %
Eosinophils Absolute: 38 cells/uL (ref 15–500)
Eosinophils Relative: 0.9 %
HCT: 40.6 % (ref 35.0–45.0)
Hemoglobin: 12.8 g/dL (ref 11.7–15.5)
Lymphs Abs: 1281 cells/uL (ref 850–3900)
MCH: 28.9 pg (ref 27.0–33.0)
MCHC: 31.5 g/dL — ABNORMAL LOW (ref 32.0–36.0)
MCV: 91.6 fL (ref 80.0–100.0)
MPV: 11 fL (ref 7.5–12.5)
Monocytes Relative: 7.1 %
Neutro Abs: 2554 cells/uL (ref 1500–7800)
Neutrophils Relative %: 60.8 %
Platelets: 292 10*3/uL (ref 140–400)
RBC: 4.43 10*6/uL (ref 3.80–5.10)
RDW: 13.9 % (ref 11.0–15.0)
Total Lymphocyte: 30.5 %
WBC: 4.2 10*3/uL (ref 3.8–10.8)

## 2021-05-15 ENCOUNTER — Ambulatory Visit (INDEPENDENT_AMBULATORY_CARE_PROVIDER_SITE_OTHER): Payer: BC Managed Care – PPO | Admitting: Physician Assistant

## 2021-05-15 ENCOUNTER — Encounter: Payer: Self-pay | Admitting: Physician Assistant

## 2021-05-15 VITALS — BP 135/93 | HR 85 | Resp 12 | Ht 65.0 in | Wt 206.6 lb

## 2021-05-15 DIAGNOSIS — M25511 Pain in right shoulder: Secondary | ICD-10-CM

## 2021-05-15 DIAGNOSIS — Z8639 Personal history of other endocrine, nutritional and metabolic disease: Secondary | ICD-10-CM

## 2021-05-15 DIAGNOSIS — M351 Other overlap syndromes: Secondary | ICD-10-CM | POA: Diagnosis not present

## 2021-05-15 DIAGNOSIS — M170 Bilateral primary osteoarthritis of knee: Secondary | ICD-10-CM

## 2021-05-15 DIAGNOSIS — Z8709 Personal history of other diseases of the respiratory system: Secondary | ICD-10-CM

## 2021-05-15 DIAGNOSIS — Z79899 Other long term (current) drug therapy: Secondary | ICD-10-CM | POA: Diagnosis not present

## 2021-05-15 DIAGNOSIS — F5101 Primary insomnia: Secondary | ICD-10-CM

## 2021-05-15 DIAGNOSIS — R5382 Chronic fatigue, unspecified: Secondary | ICD-10-CM

## 2021-05-15 DIAGNOSIS — Z111 Encounter for screening for respiratory tuberculosis: Secondary | ICD-10-CM

## 2021-05-15 DIAGNOSIS — M7061 Trochanteric bursitis, right hip: Secondary | ICD-10-CM

## 2021-05-15 DIAGNOSIS — Z13820 Encounter for screening for osteoporosis: Secondary | ICD-10-CM

## 2021-05-15 DIAGNOSIS — Z8711 Personal history of peptic ulcer disease: Secondary | ICD-10-CM

## 2021-05-15 DIAGNOSIS — Z7952 Long term (current) use of systemic steroids: Secondary | ICD-10-CM

## 2021-05-15 DIAGNOSIS — M7062 Trochanteric bursitis, left hip: Secondary | ICD-10-CM

## 2021-05-15 DIAGNOSIS — Z8719 Personal history of other diseases of the digestive system: Secondary | ICD-10-CM

## 2021-05-15 DIAGNOSIS — G8929 Other chronic pain: Secondary | ICD-10-CM

## 2021-05-15 DIAGNOSIS — Z8679 Personal history of other diseases of the circulatory system: Secondary | ICD-10-CM

## 2021-05-15 DIAGNOSIS — M25512 Pain in left shoulder: Secondary | ICD-10-CM

## 2021-05-15 DIAGNOSIS — M797 Fibromyalgia: Secondary | ICD-10-CM

## 2021-05-15 MED ORDER — PREDNISONE 5 MG PO TABS: ORAL_TABLET | ORAL | 0 refills | Status: DC

## 2021-05-15 NOTE — Patient Instructions (Signed)
Standing Labs ?We placed an order today for your standing lab work.  ? ?Please have your standing labs drawn in July and every 3 months  ? ?If possible, please have your labs drawn 2 weeks prior to your appointment so that the provider can discuss your results at your appointment. ? ?Please note that you may see your imaging and lab results in MyChart before we have reviewed them. ?We may be awaiting multiple results to interpret others before contacting you. ?Please allow our office up to 72 hours to thoroughly review all of the results before contacting the office for clarification of your results. ? ?We have open lab daily: ?Monday through Thursday from 1:30-4:30 PM and Friday from 1:30-4:00 PM ?at the office of Dr. Shaili Deveshwar, [facility].   ?Please be advised, all patients with office appointments requiring lab work will take precedent over walk-in lab work.  ?If possible, please come for your lab work on Monday and Friday afternoons, as you may experience shorter wait times. ?The office is located at [address], [address], [place], [place] [address] ?No appointment is necessary.   ?Labs are drawn by [facility]. Please bring your co-pay at the time of your lab draw.  You may receive a bill from [facility] for your lab work. ? ?Please note if you are on Hydroxychloroquine and and an order has been placed for a Hydroxychloroquine level, you will need to have it drawn 4 hours or more after your last dose. ? ?If you wish to have your labs drawn at another location, please call the office 24 hours in advance to send orders. ? ?If you have any questions regarding directions or hours of operation,  ?please call [phone].   ?As a reminder, please drink plenty of water prior to coming for your lab work. Thanks! ? ?

## 2021-05-16 NOTE — Progress Notes (Signed)
UA normal.  ESR is borderline elevated-36.  Patient was given a prednisone taper to treat the pain and inflammation she was experiencing.  ?C3 is elevated-not of concern. C4 WNL.

## 2021-05-18 LAB — QUANTIFERON-TB GOLD PLUS
Mitogen-NIL: >10 IU/mL
NIL: 0.05 IU/mL
QuantiFERON-TB Gold Plus: NEGATIVE
TB1-NIL: 0.04 IU/mL
TB2-NIL: 0 IU/mL

## 2021-05-18 LAB — URINALYSIS, ROUTINE W REFLEX MICROSCOPIC
Bilirubin Urine: NEGATIVE
Glucose, UA: NEGATIVE
Hgb urine dipstick: NEGATIVE
Ketones, ur: NEGATIVE
Leukocytes,Ua: NEGATIVE
Nitrite: NEGATIVE
Protein, ur: NEGATIVE
Specific Gravity, Urine: 1.022 (ref 1.001–1.035)
pH: 5.5 (ref 5.0–8.0)

## 2021-05-18 LAB — ANTI-NUCLEAR AB-TITER (ANA TITER)
ANA TITER: >=1:1280 {titer} — AB
ANA Titer 1: 1:1280 {titer} — ABNORMAL HIGH

## 2021-05-18 LAB — ANTI-DNA ANTIBODY, DOUBLE-STRANDED: ds DNA Ab: <1 IU/mL

## 2021-05-18 LAB — C3 AND C4
C3 Complement: 200 mg/dL — ABNORMAL HIGH (ref 83–193)
C4 Complement: 38 mg/dL (ref 15–57)

## 2021-05-18 LAB — SEDIMENTATION RATE: Sed Rate: 36 mm/h — ABNORMAL HIGH (ref 0–30)

## 2021-05-18 LAB — RNP ANTIBODY: Ribonucleic Protein(ENA) Antibody, IgG: >8 AI — AB

## 2021-05-18 LAB — ANA: Anti Nuclear Antibody (ANA): POSITIVE — AB

## 2021-05-18 LAB — ANTI-SMITH ANTIBODY: ENA SM Ab Ser-aCnc: 2.5 AI — AB

## 2021-05-19 NOTE — Progress Notes (Signed)
Smith antibody and RNP remain positive.  TB gold negative.   ?ANA remains positive. dsDNA is negative.

## 2021-05-26 ENCOUNTER — Other Ambulatory Visit: Payer: Self-pay | Admitting: Family Medicine

## 2021-05-26 DIAGNOSIS — K219 Gastro-esophageal reflux disease without esophagitis: Secondary | ICD-10-CM

## 2021-05-27 ENCOUNTER — Ambulatory Visit: Payer: BC Managed Care – PPO

## 2021-05-27 DIAGNOSIS — J4520 Mild intermittent asthma, uncomplicated: Secondary | ICD-10-CM

## 2021-06-02 ENCOUNTER — Telehealth: Payer: Self-pay | Admitting: Rheumatology

## 2021-06-02 NOTE — Telephone Encounter (Signed)
Please schedule sooner follow up with Dr. Estanislado Pandy to determine if her symptoms are due to MCTD or fibromyalgia.  If her symptoms are due to MCTD she may need her methotrexate dose to be increased instead of restarting long term prednisone.

## 2021-06-02 NOTE — Telephone Encounter (Signed)
Patient called stating it feels like her body is shutting down.  Patient states her entire body is in pain and she is having difficulty getting out of bed.  Patient requested a return call.   ?

## 2021-06-02 NOTE — Telephone Encounter (Signed)
Spoke with patient and scheduled patient for 06/04/2021 at 10:45 am. Advised patient it is a work-in appointment and she may encounter a wait.  ?

## 2021-06-02 NOTE — Progress Notes (Deleted)
Office Visit Note  [name]             Date of Birth: 1960-09-12           MRN: [id number]             PCP: [name], [name], MD Referring: [name], [name], MD Visit Date: 06/04/2021 Occupation: @GUAROCC @  Subjective:  No chief complaint on file.   History of Present Illness: Patricia Reed is a 61 y.o. female ***   Activities of Daily Living:  Patient reports morning stiffness for *** {minute/hour:19697}.   Patient {ACTIONS;DENIES/REPORTS:21021675::"Denies"} nocturnal pain.  Difficulty dressing/grooming: {ACTIONS;DENIES/REPORTS:21021675::"Denies"} Difficulty climbing stairs: {ACTIONS;DENIES/REPORTS:21021675::"Denies"} Difficulty getting out of chair: {ACTIONS;DENIES/REPORTS:21021675::"Denies"} Difficulty using hands for taps, buttons, cutlery, and/or writing: {ACTIONS;DENIES/REPORTS:21021675::"Denies"}  No Rheumatology ROS completed.   PMFS History:  Patient Active Problem List   Diagnosis Date Noted   Daytime sleepiness 03/20/2021   Prediabetes 03/19/2021   Hypokalemia 07/07/2019   Precordial chest pain 05/04/2019   Educated about COVID-19 virus infection 05/04/2019   Persistent vomiting    Abdominal pain, epigastric    Nausea & vomiting 03/11/2019   Gastritis    Nausea without vomiting 02/28/2019   Class 2 obesity with body mass index (BMI) of 37.0 to 37.9 in adult 11/25/2018   Mixed connective tissue disease ([place]) 08/14/2017   Recurrent genital herpes 05/12/2017   Shortness of breath 04/15/2017   Dizziness 02/04/2017   Situational anxiety 06/08/2016   Atypical chest pain 06/08/2016   Trapezius muscle spasm 04/23/2016   Fibromyalgia 02/13/2016   Primary insomnia 02/13/2016   Chronic fatigue 02/13/2016   Vitamin D deficiency 11/25/2015   Autoimmune disease ([address]) 11/23/2015   High risk medication use 11/23/2015   Colon cancer screening 01/25/2014   Anterior neck pain 12/01/2013   Inflammatory arthritis 08/18/2012   Allergic rhinitis  06/23/2012   HTN (hypertension) 06/23/2012   Hyperlipidemia 06/23/2012   Thyroid nodule - benign 06/23/2012   Arthralgia 06/23/2012   Mild intermittent asthma    Sickle cell trait ([place])    History of stomach ulcers    ESOPHAGEAL STRICTURE 10/04/2007   GERD 10/04/2007   Atrophic 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   Dysphagia, pharyngoesophageal phase 10/04/2007    Past Medical History:  Diagnosis Date   Allergy    Anemia    Asthma    Blood transfusion without reported diagnosis    Fibromyalgia    GERD (gastroesophageal reflux disease)    Heart murmur    High cholesterol    History of stomach ulcers    Hypertension    Lupus ([place])    MI, old    Osteoarthritis    Rheumatoid arthritis ([place])    Thyroid disease    Thyroid mass    Vertigo     Family History  Problem Relation Age of Onset   Alcohol abuse Father    Hypertension Father    Heart disease Father    Other Mother        tobacco use disorder, refuses to see a doctor   Osteoporosis Sister    Colon cancer Maternal Uncle    Diabetes Maternal Grandmother    Healthy Son    Healthy Daughter    Multiple sclerosis Daughter    Colon polyps Neg Hx    Esophageal cancer Neg Hx    Kidney disease Neg Hx    Stomach cancer Neg Hx    Rectal cancer Neg Hx    Past Surgical History:  Procedure Laterality Date  ABDOMINAL HYSTERECTOMY     done for menorrhagia, ovaries remain   CERVICAL DISCECTOMY     plates and screws in neck   CESAREAN SECTION     COLONOSCOPY     ESOPHAGEAL MANOMETRY N/A 02/12/2014   Procedure: ESOPHAGEAL MANOMETRY (EM);  Surgeon: Inda Castle, MD;  Location: [facility];  Service: Endoscopy;  Laterality: N/A;   ESOPHAGOGASTRODUODENOSCOPY  03/04/2011   Procedure: ESOPHAGOGASTRODUODENOSCOPY (EGD);  Surgeon: Inda Castle, MD;  Location: [facility];  Service: Endoscopy;  Laterality: N/A;  BOTOX Injection   ESOPHAGOGASTRODUODENOSCOPY (EGD) WITH PROPOFOL N/A 03/12/2019   Procedure: ESOPHAGOGASTRODUODENOSCOPY (EGD)  WITH PROPOFOL;  Surgeon: Carol Ada, MD;  Location: [place];  Service: Endoscopy;  Laterality: N/A;   exc benign breast lump     TONSILLECTOMY     UPPER GASTROINTESTINAL ENDOSCOPY     Social History   Social History Narrative   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     Immunization History  Administered Date(s) Administered   Influenza Inj Mdck Quad Pf 10/22/2020   Influenza Split 11/04/2015, 10/11/2017, 10/21/2018   Influenza, Quadrivalent, Recombinant, Inj, Pf 10/11/2017   Influenza,inj,Quad PF,6+ Mos 11/04/2015, 10/21/2018   Influenza-Unspecified 10/11/2017   PFIZER Comirnaty(Gray Top)Covid-19 Tri-Sucrose Vaccine 05/10/2020   PFIZER(Purple Top)SARS-COV-2 Vaccination 03/20/2019, 04/12/2019, 12/07/2019, 05/10/2020   PNEUMOCOCCAL CONJUGATE-20 10/22/2020   Pneumococcal Polysaccharide-23 01/09/2020   Tdap 08/20/2020   Zoster Recombinat (Shingrix) 10/11/2017, 12/25/2017, 01/09/2020   Zoster, Unspecified 10/11/2017, 12/25/2017     Objective: Vital Signs: There were no vitals taken for this visit.   Physical Exam   Musculoskeletal Exam: ***  CDAI Exam: CDAI Score: -- Patient Global: --; Provider Global: -- Swollen: --; Tender: -- Joint Exam 06/04/2021   No joint exam has been documented for this visit   There is currently no information documented on the homunculus. Go to the Rheumatology activity and complete the homunculus joint exam.  Investigation: No additional findings.  Imaging: No results found.  Recent Labs: Lab Results  Component Value Date   WBC 4.2 05/08/2021   HGB 12.8 05/08/2021   PLT 292 05/08/2021   NA 142 05/08/2021   K 4.1 05/08/2021   CL 102 05/08/2021   CO2 30 05/08/2021   GLUCOSE 94 05/08/2021   BUN 14 05/08/2021   CREATININE 0.82 05/08/2021   BILITOT 0.8 05/08/2021   ALKPHOS 55 03/16/2021   AST 20 05/08/2021   ALT 24 05/08/2021   PROT 7.8 05/08/2021   ALBUMIN 4.3 03/16/2021   CALCIUM 9.5  05/08/2021   GFRAA 97 06/24/2020   QFTBGOLDPLUS NEGATIVE 05/15/2021    Speciality Comments: No specialty comments available.  Procedures:  No procedures performed Allergies: Aspirin, Hydrocodone-acetaminophen, Meperidine hcl, Morphine, Oxycodone-acetaminophen, Ibuprofen, Codeine, Demerol [meperidine], Imdur [isosorbide nitrate], Isosorbide dinitrate, Procaine hcl, Toprol xl [metoprolol], and Tramadol   Assessment / Plan:     Visit Diagnoses: No diagnosis found.  Orders: No orders of the defined types were placed in this encounter.  No orders of the defined types were placed in this encounter.   Face-to-face time spent with patient was *** minutes. Greater than 50% of time was spent in counseling and coordination of care.  Follow-Up Instructions: No follow-ups on file.   Earnestine Mealing, CMA  Note - This record has been created using Editor, commissioning.  Chart creation errors have been sought, but may not always  have been located. Such creation errors do not reflect on  the standard of medical care.

## 2021-06-02 NOTE — Telephone Encounter (Signed)
Patient states she is having a burning pain all over body. She said she is having trouble moving around. She has made herself get out of the bed. Patient states her entire body is in so much pain. Patient states "it feels like my whole body is trying to lock down".  Patient states this started Thursday. Patient states she is on Xeljanz 11 mg daily and methotrexate 0.4 mg sq injections once weekly. Patient states she can tell the difference since stopping the long term prednisone. Patient states she is miserable. Patient states her quality of life is being impacted. She states that her joints feel inflamed. Please advise.  ?

## 2021-06-03 ENCOUNTER — Ambulatory Visit: Payer: BC Managed Care – PPO

## 2021-06-04 ENCOUNTER — Ambulatory Visit: Payer: BC Managed Care – PPO | Admitting: Rheumatology

## 2021-06-04 DIAGNOSIS — M797 Fibromyalgia: Secondary | ICD-10-CM

## 2021-06-04 DIAGNOSIS — Z79899 Other long term (current) drug therapy: Secondary | ICD-10-CM

## 2021-06-04 DIAGNOSIS — F5101 Primary insomnia: Secondary | ICD-10-CM

## 2021-06-04 DIAGNOSIS — Z8709 Personal history of other diseases of the respiratory system: Secondary | ICD-10-CM

## 2021-06-04 DIAGNOSIS — Z8639 Personal history of other endocrine, nutritional and metabolic disease: Secondary | ICD-10-CM

## 2021-06-04 DIAGNOSIS — Z8711 Personal history of peptic ulcer disease: Secondary | ICD-10-CM

## 2021-06-04 DIAGNOSIS — Z7952 Long term (current) use of systemic steroids: Secondary | ICD-10-CM

## 2021-06-04 DIAGNOSIS — Z8719 Personal history of other diseases of the digestive system: Secondary | ICD-10-CM

## 2021-06-04 DIAGNOSIS — G8929 Other chronic pain: Secondary | ICD-10-CM

## 2021-06-04 DIAGNOSIS — M7061 Trochanteric bursitis, right hip: Secondary | ICD-10-CM

## 2021-06-04 DIAGNOSIS — M351 Other overlap syndromes: Secondary | ICD-10-CM

## 2021-06-04 DIAGNOSIS — M170 Bilateral primary osteoarthritis of knee: Secondary | ICD-10-CM

## 2021-06-04 DIAGNOSIS — R5382 Chronic fatigue, unspecified: Secondary | ICD-10-CM

## 2021-06-04 DIAGNOSIS — Z8679 Personal history of other diseases of the circulatory system: Secondary | ICD-10-CM

## 2021-06-04 DIAGNOSIS — Z13820 Encounter for screening for osteoporosis: Secondary | ICD-10-CM

## 2021-06-05 ENCOUNTER — Other Ambulatory Visit: Payer: Self-pay | Admitting: Physician Assistant

## 2021-06-09 ENCOUNTER — Ambulatory Visit: Payer: BC Managed Care – PPO

## 2021-06-09 DIAGNOSIS — G4733 Obstructive sleep apnea (adult) (pediatric): Secondary | ICD-10-CM | POA: Diagnosis not present

## 2021-06-17 NOTE — Progress Notes (Signed)
ACUTE VISIT Chief Complaint  Patient presents with   Fatigue   HPI: [name] is a 61 y.o. female, who is here today complaining of fatigue. This is a chronic problem we have addressed in the past. She was last seen on 03/19/21, when she was c/o feeling tired. She does not feel rested in the morning when she gets up.  Problem has been worse for the past 2 weeks, feeling "wiped out." Work up has been otherwise negative. Protein electrophoresis 03/2021 and in 09/2019 were normal.  Component     Latest Ref Rng 03/19/2021  Creatinine, Urine     20 - 275 mg/dL 192   Protein/Creat Ratio     24 - 184 mg/g creat 36   Protein/Creatinine Ratio     0.024 - 0.184 mg/mg creat 0.036   Total Protein, Urine     5 - 24 mg/dL 7    Recently followed with her rheumatologist, 4/202023, when blood work was done. She is no longer on prednisone. No new medications. She had sleep study last week, result is still pending.  She is keeping track of her sleep with her watch, states that she sleeps deep for about 3 hours, light sleep for 1 hours, sleeps 4 hours at the time.Difficulty falling asleep. Trazodone 50 mg does not help, she takes it around 7 pm, goes to bed around 8:30 pm,and falling asleep around 1:30 am-2 am. She wonders if Duloxetine is causing fatigue, medication has greatly help with myalgias.  Hypertension on HCTZ 25 mg daily. Negative severe/frequent headache, visual changes, chest pain, dyspnea, palpitation, focal weakness, or edema.  Hx of sickle cells trait.  Lab Results  Component Value Date   CREATININE 0.82 05/08/2021   BUN 14 05/08/2021   NA 142 05/08/2021   K 4.1 05/08/2021   CL 102 05/08/2021   CO2 30 05/08/2021   Lab Results  Component Value Date   WBC 4.2 05/08/2021   HGB 12.8 05/08/2021   HCT 40.6 05/08/2021   MCV 91.6 05/08/2021   PLT 292 05/08/2021   Hx of thyroid nodule. Last thyroid US in 10/2020: Dominant right thyroid nodule is not  significantly changed in size since 12/07/2013 which indicates a benign etiology. No new  suspicious thyroid nodule identified which meet criteria for imaging surveillance or FNA.   Negative for abnormal wt loss, changes in bowel habits,or tremor.  Lab Results  Component Value Date   TSH 0.16 (L) 08/20/2020   Review of Systems  Constitutional:  Negative for appetite change and fever.  HENT:  Negative for mouth sores, nosebleeds and sore throat.   Respiratory:  Negative for cough and wheezing.   Gastrointestinal:  Negative for abdominal pain, nausea and vomiting.       Negative for changes in bowel habits.  Genitourinary:  Negative for decreased urine volume, difficulty urinating, dysuria and hematuria.  Musculoskeletal:  Positive for arthralgias and myalgias. Negative for gait problem.  Skin:  Negative for rash.  Allergic/Immunologic: Positive for environmental allergies.  Neurological:  Negative for syncope, weakness and headaches.  Psychiatric/Behavioral:  Negative for confusion. The patient is nervous/anxious.   Rest see pertinent positives and negatives per HPI.  Current Outpatient Medications on File Prior to Visit  Medication Sig Dispense Refill   acetaminophen (TYLENOL) 650 MG CR tablet Take 650 mg by mouth every 8 (eight) hours as needed for pain.     albuterol (PROVENTIL) (2.5 MG/3ML) 0.083% nebulizer solution Take 3 mLs (2.5 mg total) by  nebulization every 6 (six) hours as needed for wheezing or shortness of breath. 75 mL 12   Ascorbic Acid (VITAMIN C PO) Take by mouth.     B-D INS SYR ULTRAFINE 1CC/30G 30G X 1/2" 1 ML MISC      baclofen (LIORESAL) 10 MG tablet TAKE 1 TABLET BY MOUTH AT BEDTIME AS NEEDED FOR MUSCLE SPASMS 90 tablet 1   CALCIUM PO Take by mouth daily.     Cholecalciferol 25 MCG (1000 UT) capsule Take by mouth.     diclofenac sodium (VOLTAREN) 1 % GEL Apply 3 g to 3 large joints up to 3 times daily. (Patient taking differently: Apply 2 g topically daily as  needed (pain).) 3 Tube 3   DULoxetine (CYMBALTA) 30 MG capsule Take 30 mg by mouth 2 (two) times daily.     Estradiol 10 MCG TABS vaginal tablet Yuvafem 10 mcg vaginal tablet     famotidine (PEPCID) 20 MG tablet TAKE 1 TO 2 TABLETS BY MOUTH AT BEDTIME 180 tablet 1   fluticasone furoate-vilanterol (BREO ELLIPTA) 100-25 MCG/INH AEPB Inhale 1 puff into the lungs daily. 28 each 0   folic acid (FOLVITE) 1 MG tablet Take 2 tablets (2 mg total) by mouth daily. 180 tablet 3   hydrochlorothiazide (HYDRODIURIL) 25 MG tablet TAKE 1 TABLET BY MOUTH EVERY DAY 90 tablet 3   KLOR-CON M20 20 MEQ tablet TAKE 1 TABLET BY MOUTH EVERY DAY 90 tablet 3   lovastatin (MEVACOR) 20 MG tablet TAKE 1 TABLET BY MOUTH EVERYDAY AT BEDTIME 90 tablet 1   methotrexate 50 MG/2ML injection Inject 0.4 mL into skin once weekly. 6 mL 0   nystatin-triamcinolone (MYCOLOG II) cream Apply 1 application topically 2 (two) times daily. 60 g 0   Omega-3 Fatty Acids (FISH OIL) 1000 MG CAPS Take by mouth.     pantoprazole (PROTONIX) 40 MG tablet TAKE 1 TABLET BY MOUTH EVERY DAY BEFORE BREAKFAST 90 tablet 1   Tuberculin-Allergy Syringes (ALLERGY SYRINGE 1CC/27GX1/2") 27G X 1/2" 1 ML MISC Patient to use to inject MTX weekly 12 each 3   valACYclovir (VALTREX) 500 MG tablet TAKE 1 TABLET (500 MG TOTAL) BY MOUTH 2 (TWO) TIMES DAILY. FOR 3 DAYS WITH ACUTE EPISODES. (Patient taking differently: TAKE 1 TABLET (500 MG TOTAL) BY MOUTH 2 (TWO) TIMES DAILY.) 18 tablet 1   vitamin B-12 (CYANOCOBALAMIN) 100 MCG tablet Take 100 mcg by mouth daily.     VITAMIN D, ERGOCALCIFEROL, PO Take 1 capsule by mouth daily.     XELJANZ XR 11 MG TB24 Take 1 tablet by mouth daily.     Zinc Citrate-Phytase (ZYTAZE) 25-500 MG CAPS Take by mouth.     No current facility-administered medications on file prior to visit.     Past Medical History:  Diagnosis Date   Allergy    Anemia    Asthma    Blood transfusion without reported diagnosis    Fibromyalgia    GERD  (gastroesophageal reflux disease)    Heart murmur    High cholesterol    History of stomach ulcers    Hypertension    Lupus (HCC)    MI, old    Osteoarthritis    Rheumatoid arthritis ([place])    Thyroid disease    Thyroid mass    Vertigo    Allergies  Allergen Reactions   Aspirin Other (See Comments)    GI bleed   Hydrocodone-Acetaminophen Hives and Nausea And Vomiting   Meperidine Hcl Hives and Nausea And Vomiting  Short term memory loss   Morphine Hives and Nausea And Vomiting   Oxycodone-Acetaminophen Hives and Nausea And Vomiting    Reaction unknown   Ibuprofen Other (See Comments)    GI bleed   Codeine Nausea Only    Other reaction(s): Other (see comments)   Demerol [Meperidine] Nausea Only   Imdur [Isosorbide Nitrate] Other (See Comments)    Reaction unknown   Isosorbide Dinitrate Other (See Comments)    Other reaction(s): Other (see comments) Other reaction(s): Other (See Comments) Reaction unknown Reaction unknown   Procaine Hcl Other (See Comments)    Ineffective   Toprol Xl [Metoprolol] Other (See Comments)    Reaction unknown   Tramadol Itching    Social History   Socioeconomic History   Marital status: Married    Spouse name: Not on file   Number of children: 4   Years of education: Not on file   Highest education level: Not on file  Occupational History   Occupation: TEACHER ASSISTANT    Employer: [employer]  Tobacco Use   Smoking status: Never    Passive exposure: Never   Smokeless tobacco: Never  Vaping Use   Vaping Use: Never used  Substance and Sexual Activity   Alcohol use: Not Currently    Comment: 1 glass every 3 months   Drug use: No   Sexual activity: Yes    Birth control/protection: None, Surgical    Comment: LAVH  Other Topics Concern   Not on file  Social History Narrative   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     Social Determinants of Health   Financial Resource  Strain: Not on file  Food Insecurity: Not on file  Transportation Needs: Not on file  Physical Activity: Not on file  Stress: Not on file  Social Connections: Not on file    Vitals:   06/18/21 0849  BP: 120/78  Resp: 16  Temp: 98.5 F (36.9 C)   Body mass index is 34.34 kg/m.  Physical Exam Vitals and nursing note reviewed.  Constitutional:      General: She is not in acute distress.    Appearance: She is well-developed.  HENT:     Head: Normocephalic and atraumatic.     Mouth/Throat:     Mouth: Mucous membranes are moist.     Pharynx: Oropharynx is clear.  Eyes:     Conjunctiva/sclera: Conjunctivae normal.  Neck:     Thyroid: Thyromegaly present.  Cardiovascular:     Rate and Rhythm: Normal rate and regular rhythm.     Pulses:          Dorsalis pedis pulses are 2+ on the right side and 2+ on the left side.     Heart sounds: No murmur heard. Pulmonary:     Effort: Pulmonary effort is normal. No respiratory distress.     Breath sounds: Normal breath sounds.  Abdominal:     Palpations: Abdomen is soft. There is no hepatomegaly or mass.     Tenderness: There is no abdominal tenderness.  Lymphadenopathy:     Cervical: No cervical adenopathy.  Skin:    General: Skin is warm.     Findings: No erythema or rash.  Neurological:     General: No focal deficit present.     Mental Status: She is alert and oriented to person, place, and time.     Cranial Nerves: No cranial nerve deficit.     Gait: Gait  normal.  Psychiatric:     Comments: Well groomed, good eye contact.   ASSESSMENT AND PLAN:  Ms.Joanna was seen today for fatigue.  Diagnoses and all orders for this visit: Orders Placed This Encounter  Procedures   Cortisol, free, Serum   T4, free   TSH   Thyrotropin receptor autoabs   Lab Results  Component Value Date   TSH 0.98 06/18/2021   Insomnia, unspecified type Problem is not well controlled. Trazodone is not helping, so discontinue. We discussed  pharmacologic treatmetn options and side effects. She agrees with trying Lunesta 2 mg, she can start with 1/2 tab at bedtime and titrate up to 2 mg if needed. Good sleep hygiene to continue.  -     eszopiclone (LUNESTA) 2 MG TABS tablet; Take 1 tablet (2 mg total) by mouth at bedtime as needed for sleep. Take immediately before bedtime  Abnormal TSH It has been mildly abnormal intermittently. Further recommendations according to thyroid panel results.  Chronic fatigue We discussed possible etiologies: Systemic illness, immunologic,endocrinology,sleep disorder, psychiatric/psychologic, infectious,medications side effects, and idiopathic. Examination today does not suggest a serious process. Some of her chronic health problems can definitively being contributing factors. Sleep study result is pending. Improving sleep may also help. Consistency with following a healthful diet and regular physical activity may help.  Further recommendations will be given according to lab results.  Sickle cell trait (HCC) BMP and CBC done regularly at there rheumatologist's office.  I spent a total of 39 minutes in both face to face and non face to face activities for this visit on the date of this encounter. During this time history was obtained and documented, examination was performed, prior labs/imaging reviewed, and assessment/plan discussed.  Return in about 6 weeks (around 07/30/2021).  Rikki Trosper G. [name], MD  [facility]. [place] office.

## 2021-06-18 ENCOUNTER — Encounter: Payer: Self-pay | Admitting: Family Medicine

## 2021-06-18 ENCOUNTER — Ambulatory Visit (INDEPENDENT_AMBULATORY_CARE_PROVIDER_SITE_OTHER): Payer: BC Managed Care – PPO | Admitting: Family Medicine

## 2021-06-18 VITALS — BP 120/78 | Temp 98.5°F | Resp 16 | Ht 65.0 in | Wt 206.4 lb

## 2021-06-18 DIAGNOSIS — R5382 Chronic fatigue, unspecified: Secondary | ICD-10-CM

## 2021-06-18 DIAGNOSIS — G4700 Insomnia, unspecified: Secondary | ICD-10-CM | POA: Diagnosis not present

## 2021-06-18 DIAGNOSIS — D573 Sickle-cell trait: Secondary | ICD-10-CM

## 2021-06-18 DIAGNOSIS — R7989 Other specified abnormal findings of blood chemistry: Secondary | ICD-10-CM | POA: Diagnosis not present

## 2021-06-18 LAB — TSH: TSH: 0.98 u[IU]/mL (ref 0.35–5.50)

## 2021-06-18 LAB — T4, FREE: Free T4: 0.94 ng/dL (ref 0.60–1.60)

## 2021-06-18 MED ORDER — ESZOPICLONE 2 MG PO TABS: 2.0000 mg | ORAL_TABLET | Freq: Every evening | ORAL | 0 refills | Status: DC | PRN

## 2021-06-18 NOTE — Patient Instructions (Addendum)
A few things to remember from today's visit:   Insomnia, unspecified type  Primary hypertension  Abnormal TSH - Plan: T4, free, TSH, Thyrotropin receptor autoabs  Chronic fatigue - Plan: Cortisol, free, Serum  If you need refills please call your pharmacy. Do not use My Chart to request refills or for acute issues that need immediate attention.   Stop Trazodone. Lunesta 2 mg started, 1/2 tab initially and to 1 tab as tolerated. Let me know in 2 weeks if it is helping or if any side effect. Good sleep hygiene. Pending sleep study report.  Please be sure medication list is accurate. If a new problem present, please set up appointment sooner than planned today.

## 2021-06-19 LAB — THYROTROPIN RECEPTOR AUTOABS: Thyrotropin Receptor Ab: <1.1 IU/L (ref 0.00–1.75)

## 2021-06-25 DIAGNOSIS — G4733 Obstructive sleep apnea (adult) (pediatric): Secondary | ICD-10-CM

## 2021-06-25 LAB — CORTISOL, FREE: Cortisol Free, Ser: 0.64 ug/dL

## 2021-06-26 ENCOUNTER — Encounter: Payer: Self-pay | Admitting: Family Medicine

## 2021-06-30 ENCOUNTER — Other Ambulatory Visit: Payer: Self-pay | Admitting: Family Medicine

## 2021-06-30 ENCOUNTER — Telehealth: Payer: Self-pay | Admitting: Adult Health

## 2021-06-30 DIAGNOSIS — G4700 Insomnia, unspecified: Secondary | ICD-10-CM

## 2021-06-30 MED ORDER — ESZOPICLONE 3 MG PO TABS: 3.0000 mg | ORAL_TABLET | Freq: Every day | ORAL | 0 refills | Status: DC

## 2021-06-30 NOTE — Telephone Encounter (Signed)
Home sleep study Jun 09, 2021 shows moderate obstructive sleep apnea with AHI 21.9/hour and SPO2 low at 79%.  Please set up office visit to discuss results and treatment plan

## 2021-06-30 NOTE — Telephone Encounter (Signed)
ATC x1.  No answer.  VM full.

## 2021-06-30 NOTE — Telephone Encounter (Signed)
-----   Message from Patricia Reed sent at 06/27/2021  3:22 PM EDT ----- Hello this patient's HST is ready for review

## 2021-07-04 NOTE — Telephone Encounter (Signed)
ATC x2, no answer.  VM full.  Mychart message sent to patient.

## 2021-07-09 NOTE — Telephone Encounter (Signed)
ATC, VM full.  Patient has OV scheduled with Katie on 6/23.  Nothing further needed.

## 2021-07-11 ENCOUNTER — Encounter: Payer: Self-pay | Admitting: Nurse Practitioner

## 2021-07-11 ENCOUNTER — Ambulatory Visit (INDEPENDENT_AMBULATORY_CARE_PROVIDER_SITE_OTHER): Payer: BC Managed Care – PPO | Admitting: Nurse Practitioner

## 2021-07-11 ENCOUNTER — Encounter: Payer: Self-pay | Admitting: *Deleted

## 2021-07-11 VITALS — BP 142/88 | HR 86 | Temp 98.4°F | Ht 65.0 in | Wt 205.6 lb

## 2021-07-11 DIAGNOSIS — J4520 Mild intermittent asthma, uncomplicated: Secondary | ICD-10-CM

## 2021-07-11 DIAGNOSIS — G4733 Obstructive sleep apnea (adult) (pediatric): Secondary | ICD-10-CM | POA: Diagnosis not present

## 2021-07-11 HISTORY — DX: Obstructive sleep apnea (adult) (pediatric): G47.33

## 2021-07-11 NOTE — Assessment & Plan Note (Signed)
Moderate OSA with AHI 21.9/h. We reviewed treatment options and patient is agreeable to CPAP therapy. Plans to start her at auto set 5-15 cmH2O with mask of choice and heated humidification.   Patient Instructions  Start CPAP auto 5-15 cmH2O therapy every night, minimum of 4-6 hours a night.  Change equipment every 30 days or as directed by DME. Wash your tubing with warm soap and water daily, hang to dry. Wash humidifier portion weekly.  Be aware of reduced alertness and do not drive or operate heavy machinery if experiencing this or drowsiness.  Healthy weight management discussed.  Avoid or decrease alcohol consumption and medications that make you more sleepy, if possible. Notify if persistent daytime sleepiness occurs even with consistent use of CPAP.   We discussed how untreated sleep apnea puts an individual at risk for cardiac arrhthymias, pulm HTN, DM, stroke and increases their risk for daytime accidents.   Follow up in 12 weeks with Dr. Craige Cotta or Philis Nettle. If symptoms do not improve or worsen, please contact office for sooner follow up or seek emergency care.

## 2021-07-14 ENCOUNTER — Ambulatory Visit: Payer: Self-pay

## 2021-07-14 ENCOUNTER — Ambulatory Visit (INDEPENDENT_AMBULATORY_CARE_PROVIDER_SITE_OTHER): Payer: BC Managed Care – PPO

## 2021-07-14 ENCOUNTER — Ambulatory Visit (INDEPENDENT_AMBULATORY_CARE_PROVIDER_SITE_OTHER): Payer: BC Managed Care – PPO | Admitting: Orthopedic Surgery

## 2021-07-14 DIAGNOSIS — M25562 Pain in left knee: Secondary | ICD-10-CM | POA: Diagnosis not present

## 2021-07-14 DIAGNOSIS — M25561 Pain in right knee: Secondary | ICD-10-CM

## 2021-07-14 DIAGNOSIS — G8929 Other chronic pain: Secondary | ICD-10-CM

## 2021-07-15 ENCOUNTER — Encounter: Payer: Self-pay | Admitting: Orthopedic Surgery

## 2021-07-15 DIAGNOSIS — M25561 Pain in right knee: Secondary | ICD-10-CM | POA: Diagnosis not present

## 2021-08-06 ENCOUNTER — Other Ambulatory Visit: Payer: Self-pay | Admitting: *Deleted

## 2021-08-06 DIAGNOSIS — Z79899 Other long term (current) drug therapy: Secondary | ICD-10-CM

## 2021-08-06 NOTE — Progress Notes (Signed)
Office Visit Note  [name]             Date of Birth: 1960/02/03           MRN: [id number]             PCP: [place], [name], MD Referring: [place], [name], MD Visit Date: 08/19/2021 Occupation: @GUAROCC @  Subjective:  Chest pain  History of Present Illness: Patricia Reed is a 61 y.o. female with history of mixed connective tissue disease, osteoarthritis and fibromyalgia syndrome.  She states she has been having pain and discomfort in her bilateral trapezius region and bilateral shoulders.  She states she also has pain in her left jaw which radiates into her chest and her left arm.  She states that the pain is similar to the pain she had in 1995 before her heart attack.  She continues to have some discomfort in her trochanteric bursa and bilateral knee joints.  She has generalized pain and discomfort from fibromyalgia.  Activities of Daily Living:  Patient reports morning stiffness for 4 hours.   Patient Reports nocturnal pain.  Difficulty dressing/grooming: Denies Difficulty climbing stairs: Reports Difficulty getting out of chair: Denies Difficulty using hands for taps, buttons, cutlery, and/or writing: Denies  Review of Systems  Constitutional:  Negative for fatigue.  HENT:  Negative for mouth sores and mouth dryness.   Eyes:  Negative for dryness.  Respiratory:  Negative for shortness of breath.   Cardiovascular:  Positive for chest pain. Negative for palpitations.  Gastrointestinal:  Negative for blood in stool, constipation and diarrhea.  Endocrine: Negative for increased urination.  Genitourinary:  Negative for involuntary urination.  Musculoskeletal:  Positive for joint pain, joint pain, joint swelling and morning stiffness. Negative for myalgias, muscle weakness, muscle tenderness and myalgias.  Skin:  Negative for color change, rash, hair loss and sensitivity to sunlight.  Allergic/Immunologic: Negative for susceptible to infections.   Neurological:  Negative for dizziness and headaches.  Hematological:  Negative for swollen glands.  Psychiatric/Behavioral:  Positive for sleep disturbance. Negative for depressed mood. The patient is not nervous/anxious.     PMFS History:  Patient Active Problem List   Diagnosis Date Noted   Moderate obstructive sleep apnea 07/11/2021   Daytime sleepiness 03/20/2021   Prediabetes 03/19/2021   Hypokalemia 07/07/2019   Precordial chest pain 05/04/2019   Educated about COVID-19 virus infection 05/04/2019   Persistent vomiting    Abdominal pain, epigastric    Nausea & vomiting 03/11/2019   Gastritis    Nausea without vomiting 02/28/2019   Class 2 obesity with body mass index (BMI) of 37.0 to 37.9 in adult 11/25/2018   Mixed connective tissue disease (HCC) 08/14/2017   Recurrent genital herpes 05/12/2017   Shortness of breath 04/15/2017   Dizziness 02/04/2017   Situational anxiety 06/08/2016   Atypical chest pain 06/08/2016   Trapezius muscle spasm 04/23/2016   Fibromyalgia 02/13/2016   Primary insomnia 02/13/2016   Chronic fatigue 02/13/2016   Vitamin D deficiency 11/25/2015   Autoimmune disease (HCC) 11/23/2015   High risk medication use 11/23/2015   Colon cancer screening 01/25/2014   Anterior neck pain 12/01/2013   Inflammatory arthritis 08/18/2012   Allergic rhinitis 06/23/2012   HTN (hypertension) 06/23/2012   Hyperlipidemia 06/23/2012   Thyroid nodule - benign 06/23/2012   Arthralgia 06/23/2012   Mild intermittent asthma    Sickle cell trait (HCC)    History of stomach ulcers    ESOPHAGEAL STRICTURE 10/04/2007  GERD 10/04/2007   Atrophic 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   Dysphagia, pharyngoesophageal phase 10/04/2007    Past Medical History:  Diagnosis Date   Allergy    Anemia    Asthma    Blood transfusion without reported diagnosis    Fibromyalgia    GERD (gastroesophageal reflux disease)    Heart murmur    High cholesterol    History of stomach ulcers     Hypertension    Lupus (HCC)    MI, old    Moderate obstructive sleep apnea 07/11/2021   Osteoarthritis    Rheumatoid arthritis (HCC)    Thyroid disease    Thyroid mass    Vertigo     Family History  Problem Relation Age of Onset   Alcohol abuse Father    Hypertension Father    Heart disease Father    Other Mother        tobacco use disorder, refuses to see a doctor   Osteoporosis Sister    Colon cancer Maternal Uncle    Diabetes Maternal Grandmother    Healthy Son    Healthy Daughter    Multiple sclerosis Daughter    Colon polyps Neg Hx    Esophageal cancer Neg Hx    Kidney disease Neg Hx    Stomach cancer Neg Hx    Rectal cancer Neg Hx    Past Surgical History:  Procedure Laterality Date   ABDOMINAL HYSTERECTOMY     done for menorrhagia, ovaries remain   CERVICAL DISCECTOMY     plates and screws in neck   CESAREAN SECTION     COLONOSCOPY     ESOPHAGEAL MANOMETRY N/A 02/12/2014   Procedure: ESOPHAGEAL MANOMETRY (EM);  Surgeon: Louis Meckel, MD;  Location: [facility];  Service: Endoscopy;  Laterality: N/A;   ESOPHAGOGASTRODUODENOSCOPY  03/04/2011   Procedure: ESOPHAGOGASTRODUODENOSCOPY (EGD);  Surgeon: Louis Meckel, MD;  Location: [facility];  Service: Endoscopy;  Laterality: N/A;  BOTOX Injection   ESOPHAGOGASTRODUODENOSCOPY (EGD) WITH PROPOFOL N/A 03/12/2019   Procedure: ESOPHAGOGASTRODUODENOSCOPY (EGD) WITH PROPOFOL;  Surgeon: Jeani Hawking, MD;  Location: [facility] END[facility];  Service: Endoscopy;  Laterality: N/A;   exc benign breast lump     TONSILLECTOMY     UPPER GASTROINTESTINAL ENDOSCOPY     Social History   Social History Narrative   First grade Geologist, 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     Immunization History  Administered Date(s) Administered   Influenza Inj Mdck Quad Pf 10/22/2020   Influenza Split 11/04/2015, 10/11/2017, 10/21/2018   Influenza, Quadrivalent, Recombinant, Inj, Pf 10/11/2017   Influenza,inj,Quad PF,6+ Mos  11/04/2015, 10/21/2018   Influenza-Unspecified 10/11/2017   PFIZER Comirnaty(Gray Top)Covid-19 Tri-Sucrose Vaccine 05/10/2020   PFIZER(Purple Top)SARS-COV-2 Vaccination 03/20/2019, 04/12/2019, 12/07/2019, 05/10/2020   PNEUMOCOCCAL CONJUGATE-20 10/22/2020   Pneumococcal Polysaccharide-23 01/09/2020   Tdap 08/20/2020   Zoster Recombinat (Shingrix) 10/11/2017, 12/25/2017, 01/09/2020   Zoster, Unspecified 10/11/2017, 12/25/2017     Objective: Vital Signs: BP 134/86 (BP Location: Right Arm, Patient Position: Sitting, Cuff Size: Small)   Pulse 73   Resp 15   Ht 5\' 5"  (1.651 m)   Wt 204 lb (92.5 kg)   BMI 33.95 kg/m    Physical Exam Vitals and nursing note reviewed.  Constitutional:      Appearance: She is well-developed.  HENT:     Head: Normocephalic and atraumatic.  Eyes:     Conjunctiva/sclera: Conjunctivae normal.  Cardiovascular:     Rate and Rhythm: Normal rate and regular rhythm.  Heart sounds: Normal heart sounds.  Pulmonary:     Effort: Pulmonary effort is normal.     Breath sounds: Normal breath sounds.  Abdominal:     General: Bowel sounds are normal.     Palpations: Abdomen is soft.  Musculoskeletal:     Cervical back: Normal range of motion.  Lymphadenopathy:     Cervical: No cervical adenopathy.  Skin:    General: Skin is warm and dry.     Capillary Refill: Capillary refill takes less than 2 seconds.  Neurological:     Mental Status: She is alert and oriented to person, place, and time.  Psychiatric:        Behavior: Behavior normal.      Musculoskeletal Exam: C-spine was in good range of motion.  Shoulder joints, elbow joints, wrist joints, MCPs PIPs and DIPs with good range of motion with no synovitis.  Hip joints, knee joints, ankles, MTPs and PIPs with good range of motion with no synovitis.  She had discomfort range of motion of bilateral shoulders.  She had tenderness over trapezius region.  There was no point tenderness on the left side of her  chest.  CDAI Exam: CDAI Score: -- Patient Global: --; Provider Global: -- Swollen: --; Tender: -- Joint Exam 08/19/2021   No joint exam has been documented for this visit   There is currently no information documented on the homunculus. Go to the Rheumatology activity and complete the homunculus joint exam.  Investigation: No additional findings.  Imaging: No results found.  Recent Labs: Lab Results  Component Value Date   WBC 4.1 08/06/2021   HGB 12.6 08/06/2021   PLT 299 08/06/2021   NA 143 08/06/2021   K 3.9 08/06/2021   CL 105 08/06/2021   CO2 26 08/06/2021   GLUCOSE 87 08/06/2021   BUN 13 08/06/2021   CREATININE 0.86 08/06/2021   BILITOT 0.8 08/06/2021   ALKPHOS 55 03/16/2021   AST 19 08/06/2021   ALT 15 08/06/2021   PROT 7.4 08/06/2021   ALBUMIN 4.3 03/16/2021   CALCIUM 9.3 08/06/2021   GFRAA 97 06/24/2020   QFTBGOLDPLUS NEGATIVE 05/15/2021    Speciality Comments: No specialty comments available.  Procedures:  No procedures performed Allergies: Aspirin, Hydrocodone-acetaminophen, Meperidine hcl, Morphine, Oxycodone-acetaminophen, Ibuprofen, Codeine, Demerol [meperidine], Imdur [isosorbide nitrate], Isosorbide dinitrate, Procaine hcl, Toprol xl [metoprolol], and Tramadol   Assessment / Plan:     Visit Diagnoses: Mixed connective tissue disease ([place]) - +ANA,+Sm,+RNP,+RF, arthritis: She continues to have pain and discomfort in multiple joints.  She describes pain in her bilateral shoulders, bilateral trochanteric region and her knee joints.  She also has bilateral trapezius pain.  She denies any history of joint swelling.  She has reduced methotrexate to 0.6 mL subcu weekly.  She states she has not reduced methotrexate to 0.4 mL subcutaneous.  She does not want to reduce the dose of methotrexate as she notices increased pain.  No synovitis was noted on the examination.  High risk medication use - Xeljanz 11 mg XR by mouth daily, MTX 0.6 ml sq once weekly (reduced  dose low WBC count), and folic acid 2 mg daily.  Labs obtained on August 06, 2021 CBC and CMP were normal.  May 15, 2021 double-stranded DNA was negative and complements were normal.  Sed rate was mildly elevated and stable.  Smith antibody was positive.  We will recheck labs in October.  Information about immunization was placed in the AVS.  She was advised to stop  Morrie Sheldon and methotrexate if she develops an infection and resume after the infection resolves.  Will recheck autoimmune labs with her labs in 3 months.MACE blackbox warning with use of Xeljanz including increased risk of DVTs, heart attack and stroke was discussed.  Patient voiced understanding.  Long term systemic steroid user -she has been off prednisone for the last several months.  No synovitis was noted.  Use of prednisone was discouraged.  Chronic pain of both shoulders-she continues to have pain and discomfort in her bilateral shoulders.  She had painful range of motion.  Trochanteric bursitis of both hips-she complains of intermittent discomfort in the trochanteric region.  Primary osteoarthritis of both knees -she has chronic knee joint pain.  She is followed by Dr. Sharol Given.  She states that Dr. Sharol Given injected her knee joints recently.    Fibromyalgia -she complains of generalized pain and discomfort.  She is on Cymbalta 30 mg 2 capsules daily which has alleviated her symptoms.  She has also been taking baclofen 10 mg at bedtime as needed for muscle spasms and trazodone.  Chronic fatigue-she has noted improvement in the fatigue since she has been using CPAP.  Primary insomnia -insomnia has improved on CPAP.  She takes trazodone 50 mg half tablet to 1 tablet at bedtime for insomnia.   Osteoporosis screening - DEXA updated on 02/12/20 T-sore -0.3.Marland Kitchen  Regular exercise was discussed.  History of vitamin D deficiency-she is on vitamin D supplement.  Atypical chest pain-she has been having left-sided chest pain radiating into her left  arm.  Patient states that she had similar symptoms when she had a heart attack.  She tried to reach her cardiologist office but was not successful.  I advised her to go to the emergency room for evaluation.  Patient voiced understanding.  History of hypertension-blood pressure was normal today.  Other medical problems listed as follows:  History of high cholesterol  History of gastroesophageal reflux (GERD)  History of stomach ulcers  History of asthma  OSA (obstructive sleep apnea) - on CPAP, diagnosed by Dr. Halford Chessman.  Orders: No orders of the defined types were placed in this encounter.  No orders of the defined types were placed in this encounter.    Follow-Up Instructions: Return in about 3 months (around 11/19/2021) for Osteoarthritis, MCTD.   Bo Merino, MD  Note - This record has been created using Editor, commissioning.  Chart creation errors have been sought, but may not always  have been located. Such creation errors do not reflect on  the standard of medical care.

## 2021-08-07 ENCOUNTER — Other Ambulatory Visit: Payer: Self-pay | Admitting: Family Medicine

## 2021-08-07 ENCOUNTER — Encounter: Payer: Self-pay | Admitting: *Deleted

## 2021-08-07 DIAGNOSIS — G4700 Insomnia, unspecified: Secondary | ICD-10-CM

## 2021-08-07 LAB — CBC WITH DIFFERENTIAL/PLATELET
Absolute Monocytes: 316 cells/uL (ref 200–950)
Basophils Absolute: 29 cells/uL (ref 0–200)
Basophils Relative: 0.7 %
Eosinophils Absolute: 148 cells/uL (ref 15–500)
Eosinophils Relative: 3.6 %
HCT: 38.3 % (ref 35.0–45.0)
Hemoglobin: 12.6 g/dL (ref 11.7–15.5)
Lymphs Abs: 734 cells/uL — ABNORMAL LOW (ref 850–3900)
MCH: 30.5 pg (ref 27.0–33.0)
MCHC: 32.9 g/dL (ref 32.0–36.0)
MCV: 92.7 fL (ref 80.0–100.0)
MPV: 10.5 fL (ref 7.5–12.5)
Monocytes Relative: 7.7 %
Neutro Abs: 2874 cells/uL (ref 1500–7800)
Neutrophils Relative %: 70.1 %
Platelets: 299 10*3/uL (ref 140–400)
RBC: 4.13 10*6/uL (ref 3.80–5.10)
RDW: 14.8 % (ref 11.0–15.0)
Total Lymphocyte: 17.9 %
WBC: 4.1 10*3/uL (ref 3.8–10.8)

## 2021-08-07 LAB — COMPLETE METABOLIC PANEL WITH GFR
AG Ratio: 1.5 (calc) (ref 1.0–2.5)
ALT: 15 U/L (ref 6–29)
AST: 19 U/L (ref 10–35)
Albumin: 4.4 g/dL (ref 3.6–5.1)
Alkaline phosphatase (APISO): 59 U/L (ref 37–153)
BUN: 13 mg/dL (ref 7–25)
CO2: 26 mmol/L (ref 20–32)
Calcium: 9.3 mg/dL (ref 8.6–10.4)
Chloride: 105 mmol/L (ref 98–110)
Creat: 0.86 mg/dL (ref 0.50–1.03)
Globulin: 3 g/dL (calc) (ref 1.9–3.7)
Glucose, Bld: 87 mg/dL (ref 65–99)
Potassium: 3.9 mmol/L (ref 3.5–5.3)
Sodium: 143 mmol/L (ref 135–146)
Total Bilirubin: 0.8 mg/dL (ref 0.2–1.2)
Total Protein: 7.4 g/dL (ref 6.1–8.1)
eGFR: 78 mL/min/{1.73_m2} (ref >=60)

## 2021-08-07 NOTE — Progress Notes (Signed)
CBC and CMP are normal.  Lymphocyte count is low due to immunosuppression.

## 2021-08-19 ENCOUNTER — Ambulatory Visit: Payer: BC Managed Care – PPO | Attending: Rheumatology | Admitting: Rheumatology

## 2021-08-19 ENCOUNTER — Encounter: Payer: Self-pay | Admitting: Rheumatology

## 2021-08-19 VITALS — BP 134/86 | HR 73 | Resp 15 | Ht 65.0 in | Wt 204.0 lb

## 2021-08-19 DIAGNOSIS — M170 Bilateral primary osteoarthritis of knee: Secondary | ICD-10-CM

## 2021-08-19 DIAGNOSIS — Z7952 Long term (current) use of systemic steroids: Secondary | ICD-10-CM

## 2021-08-19 DIAGNOSIS — Z8709 Personal history of other diseases of the respiratory system: Secondary | ICD-10-CM

## 2021-08-19 DIAGNOSIS — Z8719 Personal history of other diseases of the digestive system: Secondary | ICD-10-CM

## 2021-08-19 DIAGNOSIS — G8929 Other chronic pain: Secondary | ICD-10-CM

## 2021-08-19 DIAGNOSIS — R0789 Other chest pain: Secondary | ICD-10-CM

## 2021-08-19 DIAGNOSIS — Z79899 Other long term (current) drug therapy: Secondary | ICD-10-CM

## 2021-08-19 DIAGNOSIS — Z13820 Encounter for screening for osteoporosis: Secondary | ICD-10-CM

## 2021-08-19 DIAGNOSIS — M797 Fibromyalgia: Secondary | ICD-10-CM

## 2021-08-19 DIAGNOSIS — M351 Other overlap syndromes: Secondary | ICD-10-CM | POA: Diagnosis not present

## 2021-08-19 DIAGNOSIS — Z8711 Personal history of peptic ulcer disease: Secondary | ICD-10-CM

## 2021-08-19 DIAGNOSIS — F5101 Primary insomnia: Secondary | ICD-10-CM

## 2021-08-19 DIAGNOSIS — Z8639 Personal history of other endocrine, nutritional and metabolic disease: Secondary | ICD-10-CM

## 2021-08-19 DIAGNOSIS — M25511 Pain in right shoulder: Secondary | ICD-10-CM

## 2021-08-19 DIAGNOSIS — M7061 Trochanteric bursitis, right hip: Secondary | ICD-10-CM

## 2021-08-19 DIAGNOSIS — Z8679 Personal history of other diseases of the circulatory system: Secondary | ICD-10-CM

## 2021-08-19 DIAGNOSIS — M25512 Pain in left shoulder: Secondary | ICD-10-CM

## 2021-08-19 DIAGNOSIS — G4733 Obstructive sleep apnea (adult) (pediatric): Secondary | ICD-10-CM

## 2021-08-19 DIAGNOSIS — R5382 Chronic fatigue, unspecified: Secondary | ICD-10-CM

## 2021-08-19 DIAGNOSIS — M7062 Trochanteric bursitis, left hip: Secondary | ICD-10-CM

## 2021-08-19 NOTE — Patient Instructions (Signed)
Standing Labs We placed an order today for your standing lab work.   Please have your standing labs drawn in October and every 3 months  If possible, please have your labs drawn 2 weeks prior to your appointment so that the provider can discuss your results at your appointment.  Please note that you may see your imaging and lab results in MyChart before we have reviewed them. We may be awaiting multiple results to interpret others before contacting you. Please allow our office up to 72 hours to thoroughly review all of the results before contacting the office for clarification of your results.  We have open lab daily: Monday through Thursday from 1:30-4:30 PM and Friday from 1:30-4:00 PM at the office of Dr. Pollyann Savoy, [facility].   Please be advised, all patients with office appointments requiring lab work will take precedent over walk-in lab work.  If possible, please come for your lab work on Monday and Friday afternoons, as you may experience shorter wait times. The office is located at [address], [address], [place], [place] [address] No appointment is necessary.   Labs are drawn by [facility]. Please bring your co-pay at the time of your lab draw.  You may receive a bill from [facility] for your lab work.  Please note if you are on Hydroxychloroquine and and an order has been placed for a Hydroxychloroquine level, you will need to have it drawn 4 hours or more after your last dose.  If you wish to have your labs drawn at another location, please call the office 24 hours in advance to send orders.  If you have any questions regarding directions or hours of operation,  please call [phone].   As a reminder, please drink plenty of water prior to coming for your lab work. Thanks!   HereVaccines You are taking a medication(s) that can suppress your immune system.  The following immunizations are recommended: Flu annually Covid-19  Td/Tdap (tetanus, diphtheria,  pertussis) every 10 years Pneumonia (Prevnar 15 then Pneumovax 23 at least 1 year apart.  Alternatively, can take Prevnar 20 without needing additional dose) Shingrix: 2 doses from 4 weeks to 6 months apart  Please check with your PCP to make sure you are up to date.   If you have signs or symptoms of an infection or start antibiotics: First, call your PCP for workup of your infection. Hold your medication through the infection, until you complete your antibiotics, and until symptoms resolve if you take the following: Injectable medication (Actemra, Benlysta, Cimzia, Cosentyx, Enbrel, Humira, Kevzara, Orencia, Remicade, Simponi, Stelara, Taltz, Tremfya) Methotrexate Leflunomide (Arava) Mycophenolate (Cellcept) [name], Olumiant, or Rinvoq

## 2021-09-17 ENCOUNTER — Other Ambulatory Visit: Payer: Self-pay | Admitting: Family Medicine

## 2021-09-17 ENCOUNTER — Telehealth: Payer: Self-pay | Admitting: Family Medicine

## 2021-09-17 DIAGNOSIS — I10 Essential (primary) hypertension: Secondary | ICD-10-CM

## 2021-09-17 MED ORDER — PREDNISONE 5 MG PO TABS: ORAL_TABLET | ORAL | 0 refills | Status: DC

## 2021-09-17 NOTE — Telephone Encounter (Signed)
Please call in a prednisone taper starting at 20 mg and taper by 5 mg every 4 days.  Prednisone can cause elevated blood pressure, elevated blood sugar, weight gain and osteoporosis.

## 2021-09-17 NOTE — Telephone Encounter (Signed)
Patient sees pulmonology. [facility] is aware.

## 2021-09-17 NOTE — Telephone Encounter (Signed)
Patricia Reed with [facility]  [phone] Calling to FU on fax sent on 09/12/21 regarding the cpap supplies for this Pt.  She also wants to know if Pt needs an appt first, before MD will complete form?  Please advise.  Fax: [phone]

## 2021-09-19 ENCOUNTER — Ambulatory Visit: Payer: BC Managed Care – PPO | Admitting: Family Medicine

## 2021-09-23 ENCOUNTER — Encounter: Payer: Self-pay | Admitting: Family Medicine

## 2021-09-23 ENCOUNTER — Ambulatory Visit (INDEPENDENT_AMBULATORY_CARE_PROVIDER_SITE_OTHER): Payer: BC Managed Care – PPO | Admitting: Family Medicine

## 2021-09-23 ENCOUNTER — Other Ambulatory Visit: Payer: Self-pay | Admitting: *Deleted

## 2021-09-23 VITALS — BP 120/80 | HR 100 | Resp 16 | Ht 65.0 in | Wt 200.2 lb

## 2021-09-23 DIAGNOSIS — E876 Hypokalemia: Secondary | ICD-10-CM | POA: Diagnosis not present

## 2021-09-23 DIAGNOSIS — Z23 Encounter for immunization: Secondary | ICD-10-CM

## 2021-09-23 DIAGNOSIS — M351 Other overlap syndromes: Secondary | ICD-10-CM

## 2021-09-23 DIAGNOSIS — I10 Essential (primary) hypertension: Secondary | ICD-10-CM | POA: Diagnosis not present

## 2021-09-23 DIAGNOSIS — R079 Chest pain, unspecified: Secondary | ICD-10-CM | POA: Diagnosis not present

## 2021-09-23 DIAGNOSIS — G4700 Insomnia, unspecified: Secondary | ICD-10-CM | POA: Diagnosis not present

## 2021-09-23 DIAGNOSIS — Z79899 Other long term (current) drug therapy: Secondary | ICD-10-CM

## 2021-09-23 MED ORDER — ESZOPICLONE 3 MG PO TABS: 3.0000 mg | ORAL_TABLET | Freq: Every evening | ORAL | 1 refills | Status: DC | PRN

## 2021-09-23 NOTE — Progress Notes (Signed)
HPI: Patricia Reed is a 61 y.o. female, who is here today for chronic disease management.  Last seen on 06/18/21 Since her last visit she has been evaluated in the ED for right-sided chest pain and right upper back pain, 08/19/21. Pain is intermittent, nonradiating, exacerbated by certain movements and with deep breathing. No history of trauma. CXR:No active cardiopulmonary disease.  Chest CTA: 1. No acute intrathoracic pathology. No aortic dissection or CT  evidence of pulmonary artery embolus.  2. A 3 cm right thyroid nodule. This has been evaluated on previous imaging.   Fibromyalgia: She has an appointment with her rheumatologist today, planning on having blood work. She is on prednisone 5 mg daily for fibromyalgia "flareup."  She has also seen Dr Lajoyce Corners for bilateral knee pain.  HTN: She is on HCTZ 25 mg daily. Negative for severe/frequent headache, visual changes, exertional chest pain, dyspnea, palpitation, claudication, focal weakness, or edema.  HypoK+ on KLOR 20 meq daily and high potassium diet.  Sodium 135 - 146 MMOL/L 139    Potassium 3.5 - 5.3 MMOL/L 3.1 Low     Chloride 98 - 110 MMOL/L 102    CO2 23 - 30 MMOL/L 30    BUN 8 - 24 MG/DL 10    Glucose 70 - 99 MG/DL 85  Patients taking eltrombopag at doses >/= 100 mg daily may show falsely elevated values of 10% or greater.  Creatinine 0.50 - 1.50 MG/DL 4.40    Calcium 8.5 - 34.7 MG/DL 9.3    Total Protein 6.0 - 8.3 G/DL 7.6  Patients taking eltrombopag at doses >/= 100 mg daily may show falsely elevated values of 10% or greater.  Albumin  3.5 - 5.0 G/DL 4.4    Total Bilirubin 0.1 - 1.2 MG/DL 0.6  Patients taking eltrombopag at doses >/= 100 mg daily may show falsely elevated values of 10% or greater.  Alkaline Phosphatase 25 - 125 IU/L or U/L 56    AST (SGOT) 5 - 40 IU/L or U/L 19    ALT (SGPT) 5 - 50 IU/L or U/L 13    Anion Gap 4 - 14 MMOL/L 7    Est. GFR >=60 ML/MIN/1.73 M*2     Insomnia and fatigue:   Fatigue has improved, for the past few days she starts feeling tired around 2 30-3 p.m.  She will wonders if it is caused by hypokalemia.  She is on Lunesta 3 mg daily as needed at bedtime. Medication has really helped with sleep. She is feeling rested in the morning. Also started wearing her CPAP for OSA. 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  Review of Systems  Constitutional:  Negative for activity change, appetite change and fever.  HENT:  Negative for mouth sores, nosebleeds and sore throat.   Respiratory:  Negative for cough and wheezing.   Gastrointestinal:  Negative for abdominal pain, nausea and vomiting.       Negative for changes in bowel habits.  Genitourinary:  Negative for decreased urine volume, dysuria and hematuria.  Musculoskeletal:  Positive for arthralgias and myalgias. Negative for gait problem.  Skin:  Negative for rash.  Neurological:  Negative for syncope, facial asymmetry and weakness.  Rest of ROS see pertinent positives and negatives in HPI.  Current Outpatient Medications on File Prior to Visit  Medication Sig Dispense Refill   acetaminophen (TYLENOL) 650 MG CR tablet Take 650 mg by mouth every 8 (eight) hours  as needed for pain.     albuterol (PROVENTIL) (2.5 MG/3ML) 0.083% nebulizer solution Take 3 mLs (2.5 mg total) by nebulization every 6 (six) hours as needed for wheezing or shortness of breath. 75 mL 12   Ascorbic Acid (VITAMIN C PO) Take by mouth.     B-D INS SYR ULTRAFINE 1CC/30G 30G X 1/2" 1 ML MISC      baclofen (LIORESAL) 10 MG tablet TAKE 1 TABLET BY MOUTH AT BEDTIME AS NEEDED FOR MUSCLE SPASMS 90 tablet 1   CALCIUM PO Take by mouth daily.     Cholecalciferol 25 MCG (1000 UT) capsule Take by mouth.     diclofenac sodium (VOLTAREN) 1 % GEL Apply 3 g to 3 large joints up to 3 times daily. (Patient taking differently: Apply 2 g topically daily as needed (pain).) 3 Tube 3    DULoxetine (CYMBALTA) 30 MG capsule Take 30 mg by mouth 2 (two) times daily.     Estradiol 10 MCG TABS vaginal tablet Yuvafem 10 mcg vaginal tablet     famotidine (PEPCID) 20 MG tablet TAKE 1 TO 2 TABLETS BY MOUTH AT BEDTIME 180 tablet 1   fluticasone furoate-vilanterol (BREO ELLIPTA) 100-25 MCG/INH AEPB Inhale 1 puff into the lungs daily. 28 each 0   folic acid (FOLVITE) 1 MG tablet Take 2 tablets (2 mg total) by mouth daily. 180 tablet 3   hydrochlorothiazide (HYDRODIURIL) 25 MG tablet TAKE 1 TABLET BY MOUTH EVERY DAY 90 tablet 3   KLOR-CON M20 20 MEQ tablet TAKE 1 TABLET BY MOUTH EVERY DAY 90 tablet 3   lovastatin (MEVACOR) 20 MG tablet TAKE 1 TABLET BY MOUTH EVERYDAY AT BEDTIME 90 tablet 1   methotrexate 50 MG/2ML injection Inject 0.4 mL into skin once weekly. (Patient taking differently: Inject 0.6 mL into skin once weekly.) 6 mL 0   nystatin-triamcinolone (MYCOLOG II) cream Apply 1 application topically 2 (two) times daily. 60 g 0   Omega-3 Fatty Acids (FISH OIL) 1000 MG CAPS Take by mouth.     pantoprazole (PROTONIX) 40 MG tablet TAKE 1 TABLET BY MOUTH EVERY DAY BEFORE BREAKFAST 90 tablet 1   predniSONE (DELTASONE) 5 MG tablet Take 4 tabs po x 4 days, 3  tabs po x 4 days, 2  tabs po x 4 days, 1  tab po x 4 days 40 tablet 0   Tuberculin-Allergy Syringes (ALLERGY SYRINGE 1CC/27GX1/2") 27G X 1/2" 1 ML MISC Patient to use to inject MTX weekly 12 each 3   valACYclovir (VALTREX) 500 MG tablet TAKE 1 TABLET (500 MG TOTAL) BY MOUTH 2 (TWO) TIMES DAILY. FOR 3 DAYS WITH ACUTE EPISODES. (Patient taking differently: TAKE 1 TABLET (500 MG TOTAL) BY MOUTH 2 (TWO) TIMES DAILY.) 18 tablet 1   vitamin B-12 (CYANOCOBALAMIN) 100 MCG tablet Take 100 mcg by mouth daily.     VITAMIN D, ERGOCALCIFEROL, PO Take 1 capsule by mouth daily.     XELJANZ XR 11 MG TB24 Take 1 tablet by mouth daily.     Zinc Citrate-Phytase (ZYTAZE) 25-500 MG CAPS Take by mouth.     No current facility-administered medications on file  prior to visit.   Past Medical History:  Diagnosis Date   Allergy    Anemia    Asthma    Blood transfusion without reported diagnosis    Fibromyalgia    GERD (gastroesophageal reflux disease)    Heart murmur    High cholesterol    History of stomach ulcers    Hypertension  Lupus (HCC)    MI, old    Moderate obstructive sleep apnea 07/11/2021   Osteoarthritis    Rheumatoid arthritis (HCC)    Thyroid disease    Thyroid mass    Vertigo    Allergies  Allergen Reactions   Aspirin Other (See Comments)    GI bleed   Hydrocodone-Acetaminophen Hives and Nausea And Vomiting   Meperidine Hcl Hives and Nausea And Vomiting    Short term memory loss   Morphine Hives and Nausea And Vomiting   Oxycodone-Acetaminophen Hives and Nausea And Vomiting    Reaction unknown   Ibuprofen Other (See Comments)    GI bleed   Codeine Nausea Only    Other reaction(s): Other (see comments)   Demerol [Meperidine] Nausea Only   Imdur [Isosorbide Nitrate] Other (See Comments)    Reaction unknown   Isosorbide Dinitrate Other (See Comments)    Other reaction(s): Other (see comments) Other reaction(s): Other (See Comments) Reaction unknown Reaction unknown   Procaine Hcl Other (See Comments)    Ineffective   Toprol Xl [Metoprolol] Other (See Comments)    Reaction unknown   Tramadol Itching   Social History   Socioeconomic History   Marital status: Married    Spouse name: Not on file   Number of children: 4   Years of education: Not on file   Highest education level: Not on file  Occupational History   Occupation: TEACHER ASSISTANT    Employer: [employer]  Tobacco Use   Smoking status: Never    Passive exposure: Never   Smokeless tobacco: Never  Vaping Use   Vaping Use: Never used  Substance and Sexual Activity   Alcohol use: Not Currently    Alcohol/week: 1.0 standard drink of alcohol    Types: 1 Glasses of wine per week    Comment: 1 glass every 3 months   Drug use:  No   Sexual activity: Yes    Birth control/protection: None, Surgical    Comment: LAVH  Other Topics Concern   Not on file  Social History Narrative   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     Social Determinants of Health   Financial Resource Strain: Not on file  Food Insecurity: Not on file  Transportation Needs: Not on file  Physical Activity: Not on file  Stress: Not on file  Social Connections: Not on file   Vitals:   09/23/21 1447  BP: 120/80  Pulse: 100  Resp: 16  SpO2: 97%  Body mass index is 33.32 kg/m. Physical Exam Vitals and nursing note reviewed.  Constitutional:      General: She is not in acute distress.    Appearance: She is well-developed.  HENT:     Head: Normocephalic and atraumatic.  Eyes:     Conjunctiva/sclera: Conjunctivae normal.  Cardiovascular:     Rate and Rhythm: Normal rate and regular rhythm.     Pulses:          Dorsalis pedis pulses are 2+ on the right side and 2+ on the left side.     Heart sounds: No murmur heard. Pulmonary:     Effort: Pulmonary effort is normal. No respiratory distress.     Breath sounds: Normal breath sounds.  Chest:     Chest wall: Tenderness present.    Abdominal:     Palpations: Abdomen is soft. There is no hepatomegaly or mass.     Tenderness: There is no abdominal  tenderness.  Musculoskeletal:     Thoracic back: Tenderness present.       Back:  Lymphadenopathy:     Cervical: No cervical adenopathy.  Skin:    General: Skin is warm.     Findings: No erythema or rash.  Neurological:     General: No focal deficit present.     Mental Status: She is alert and oriented to person, place, and time.     Cranial Nerves: No cranial nerve deficit.     Gait: Gait normal.  Psychiatric:     Comments: Well groomed, good eye contact.   ASSESSMENT AND PLAN:  Ms.George was seen today for follow-up.  Diagnoses and all orders for this visit: Orders Placed This Encounter   Procedures   Flu Vaccine QUAD 69mo+IM (Fluarix, Fluzone & Alfiuria Quad PF)   Basic metabolic panel   Lab Results  Component Value Date   CREATININE 0.92 09/23/2021   BUN 18 09/23/2021   NA 139 09/23/2021   K 4.3 09/23/2021   CL 98 09/23/2021   CO2 28 09/23/2021   Insomnia, unspecified type Problem has improved. Continue eszopiclone 3 mg daily at bedtime as needed. Good sleep hygiene also recommended. Follow-up in 5 to 6 months.  -     eszopiclone 3 MG TABS; Take 1 tablet (3 mg total) by mouth at bedtime as needed. Take immediately before bedtime  Hypokalemia We discussed some side effects of HCTZ. Continue K-Lor 20 mK daily. If problem does not resolve, we need to consider discontinuing HCTZ.  Need for influenza vaccination -     Flu Vaccine QUAD 14mo+IM (Fluarix, Fluzone & Alfiuria Quad PF)  Right-sided chest pain Problem has improved. We discussed possible etiologies, history suggest musculoskeletal pain. Clearly instructed about warning signs.  HTN (hypertension) BP adequately controlled. Continue current management: HCTZ 25 mg daily, we discussed side effects. DASH/low salt diet to continue. Monitor BP at home. Eye exam is current.  Return in about 6 months (around 03/24/2022).  Patricia Gatliff G. [name], MD  [facility]. [place] office.

## 2021-09-23 NOTE — Patient Instructions (Addendum)
A few things to remember from today's visit:  Insomnia, unspecified type - Plan: eszopiclone 3 MG TABS  Primary hypertension - Plan: Basic metabolic panel  Hypokalemia - Plan: Basic metabolic panel  If you need refills please call your pharmacy. Do not use My Chart to request refills or for acute issues that need immediate attention.   No changes today. If potassium is still low, we will need to discontinue hydrochlorothiazide.  Please be sure medication list is accurate. If a new problem present, please set up appointment sooner than planned today.

## 2021-09-23 NOTE — Assessment & Plan Note (Signed)
BP adequately controlled. Continue current management: HCTZ 25 mg daily, we discussed side effects. DASH/low salt diet to continue. Monitor BP at home. Eye exam is current.

## 2021-09-24 LAB — COMPLETE METABOLIC PANEL WITH GFR
AG Ratio: 1.4 (calc) (ref 1.0–2.5)
ALT: 16 U/L (ref 6–29)
AST: 14 U/L (ref 10–35)
Albumin: 4.5 g/dL (ref 3.6–5.1)
Alkaline phosphatase (APISO): 63 U/L (ref 37–153)
BUN: 18 mg/dL (ref 7–25)
CO2: 28 mmol/L (ref 20–32)
Calcium: 9.7 mg/dL (ref 8.6–10.4)
Chloride: 98 mmol/L (ref 98–110)
Creat: 0.92 mg/dL (ref 0.50–1.03)
Globulin: 3.3 g/dL (calc) (ref 1.9–3.7)
Glucose, Bld: 101 mg/dL — ABNORMAL HIGH (ref 65–99)
Potassium: 4.3 mmol/L (ref 3.5–5.3)
Sodium: 139 mmol/L (ref 135–146)
Total Bilirubin: 0.6 mg/dL (ref 0.2–1.2)
Total Protein: 7.8 g/dL (ref 6.1–8.1)
eGFR: 72 mL/min/{1.73_m2} (ref >=60)

## 2021-09-24 LAB — CBC WITH DIFFERENTIAL/PLATELET
Absolute Monocytes: 278 cells/uL (ref 200–950)
Basophils Absolute: 29 cells/uL (ref 0–200)
Basophils Relative: 0.5 %
Eosinophils Absolute: 12 cells/uL — ABNORMAL LOW (ref 15–500)
Eosinophils Relative: 0.2 %
HCT: 39.9 % (ref 35.0–45.0)
Hemoglobin: 13.3 g/dL (ref 11.7–15.5)
Lymphs Abs: 667 cells/uL — ABNORMAL LOW (ref 850–3900)
MCH: 30.6 pg (ref 27.0–33.0)
MCHC: 33.3 g/dL (ref 32.0–36.0)
MCV: 91.7 fL (ref 80.0–100.0)
MPV: 10 fL (ref 7.5–12.5)
Monocytes Relative: 4.8 %
Neutro Abs: 4814 cells/uL (ref 1500–7800)
Neutrophils Relative %: 83 %
Platelets: 359 10*3/uL (ref 140–400)
RBC: 4.35 10*6/uL (ref 3.80–5.10)
RDW: 14.3 % (ref 11.0–15.0)
Total Lymphocyte: 11.5 %
WBC: 5.8 10*3/uL (ref 3.8–10.8)

## 2021-09-24 LAB — C3 AND C4
C3 Complement: 204 mg/dL — ABNORMAL HIGH (ref 83–193)
C4 Complement: 40 mg/dL (ref 15–57)

## 2021-09-24 LAB — BASIC METABOLIC PANEL
BUN: 18 mg/dL (ref 6–23)
CO2: 32 mEq/L (ref 19–32)
Calcium: 10 mg/dL (ref 8.4–10.5)
Chloride: 99 mEq/L (ref 96–112)
Creatinine, Ser: 0.91 mg/dL (ref 0.40–1.20)
GFR: 69.11 mL/min (ref >60.00)
Glucose, Bld: 88 mg/dL (ref 70–99)
Potassium: 4.2 mEq/L (ref 3.5–5.1)
Sodium: 141 mEq/L (ref 135–145)

## 2021-09-24 LAB — PROTEIN / CREATININE RATIO, URINE
Creatinine, Urine: 155 mg/dL (ref 20–275)
Protein/Creat Ratio: 71 mg/g creat (ref 24–184)
Protein/Creatinine Ratio: 0.071 mg/mg creat (ref 0.024–0.184)
Total Protein, Urine: 11 mg/dL (ref 5–24)

## 2021-09-24 LAB — SEDIMENTATION RATE: Sed Rate: 31 mm/h — ABNORMAL HIGH (ref 0–30)

## 2021-09-24 LAB — ANTI-DNA ANTIBODY, DOUBLE-STRANDED: ds DNA Ab: <1 IU/mL

## 2021-09-24 NOTE — Progress Notes (Signed)
Sed rate is mildly elevated and stable.  CMP is normal.  CBC is normal except lymphocyte count is low due to immunosuppression.  Complements are normal, double-stranded DNA is negative, urine protein is negative.  Labs do not indicate autoimmune disease flare.

## 2021-09-28 ENCOUNTER — Emergency Department (HOSPITAL_BASED_OUTPATIENT_CLINIC_OR_DEPARTMENT_OTHER)
Admission: EM | Admit: 2021-09-28 | Discharge: 2021-09-28 | Discharge status: Against Medical Advice | Payer: BC Managed Care – PPO | Source: Home / Self Care

## 2021-09-29 ENCOUNTER — Telehealth: Payer: Self-pay | Admitting: *Deleted

## 2021-09-29 ENCOUNTER — Telehealth: Payer: Self-pay | Admitting: Physician Assistant

## 2021-09-29 NOTE — Telephone Encounter (Signed)
Per Sherron Ales, PA-C, call patient to check and see how she is doing and if she would like to schedule a sooner appointment.   Attempted to contact the patient and unable to leave a message, voicemail is full.

## 2021-09-29 NOTE — Telephone Encounter (Signed)
Patient called the on-call service yesterday afternoon at 3:16pm.  Patient was at [facility] emergency department while she placed the call.  According to the patient she started to experience signs and symptoms of a flare Sunday morning which had progressively worsened as the day went on.  She was unsure if her flare was due to lupus or fibromyalgia or both.  She was experiencing significant fatigue as well as significant pain in her muscles and joints.  Her symptoms have progressively worsened since leaving church yesterday morning.  She was unable to identify a trigger for her flare.  She has been taking her medications as prescribed including methotrexate and Xeljanz. She plans on remaining in the emergency department to receive care to treat the flare.  Please call the patient to check on how she is feeling today and to see if she would like to schedule a sooner office visit this week for further evaluation.  Sherron Ales, PA-C

## 2021-10-01 ENCOUNTER — Ambulatory Visit: Payer: BC Managed Care – PPO | Admitting: Nurse Practitioner

## 2021-10-01 NOTE — Telephone Encounter (Signed)
Patient returned call to the office and left message for a return call. Attempted to contact the patient and unable to leave a message for patient, mailbox is full.

## 2021-10-01 NOTE — Telephone Encounter (Signed)
Has she started on CPAP? That was what our follow up appointment was for. Needs to be on therapy for at least 31 days. Ok to do virtual visit. Thanks!

## 2021-10-01 NOTE — Telephone Encounter (Signed)
Please advise if ok to do a Mychart for another day?

## 2021-10-28 ENCOUNTER — Ambulatory Visit (INDEPENDENT_AMBULATORY_CARE_PROVIDER_SITE_OTHER): Payer: BC Managed Care – PPO | Admitting: Orthopaedic Surgery

## 2021-10-28 ENCOUNTER — Encounter: Payer: Self-pay | Admitting: Orthopaedic Surgery

## 2021-10-28 ENCOUNTER — Telehealth: Payer: Self-pay | Admitting: Orthopaedic Surgery

## 2021-10-28 VITALS — BP 129/84 | HR 90 | Ht 65.0 in | Wt 201.0 lb

## 2021-10-28 DIAGNOSIS — M06862 Other specified rheumatoid arthritis, left knee: Secondary | ICD-10-CM | POA: Diagnosis not present

## 2021-10-28 DIAGNOSIS — M1712 Unilateral primary osteoarthritis, left knee: Secondary | ICD-10-CM

## 2021-10-28 DIAGNOSIS — M06861 Other specified rheumatoid arthritis, right knee: Secondary | ICD-10-CM

## 2021-10-28 DIAGNOSIS — M1711 Unilateral primary osteoarthritis, right knee: Secondary | ICD-10-CM | POA: Diagnosis not present

## 2021-10-28 DIAGNOSIS — M1990 Unspecified osteoarthritis, unspecified site: Secondary | ICD-10-CM | POA: Diagnosis not present

## 2021-10-28 DIAGNOSIS — M170 Bilateral primary osteoarthritis of knee: Secondary | ICD-10-CM | POA: Diagnosis not present

## 2021-10-28 DIAGNOSIS — M351 Other overlap syndromes: Secondary | ICD-10-CM | POA: Diagnosis not present

## 2021-10-28 MED ORDER — METHYLPREDNISOLONE ACETATE 40 MG/ML IJ SUSP
40.0000 mg | INTRAMUSCULAR | Status: AC | PRN
  Administered 2021-10-28: 40 mg via INTRA_ARTICULAR

## 2021-10-28 MED ORDER — LIDOCAINE HCL 1 % IJ SOLN
0.5000 mL | INTRAMUSCULAR | Status: AC | PRN
  Administered 2021-10-28: .5 mL

## 2021-10-28 MED ORDER — BUPIVACAINE HCL 0.5 % IJ SOLN
3.0000 mL | INTRAMUSCULAR | Status: AC | PRN
  Administered 2021-10-28: 3 mL via INTRA_ARTICULAR

## 2021-10-28 MED ORDER — BUPIVACAINE HCL 0.25 % IJ SOLN
4.0000 mL | INTRAMUSCULAR | Status: AC | PRN
  Administered 2021-10-28: 4 mL via INTRA_ARTICULAR

## 2021-10-28 NOTE — Progress Notes (Signed)
Office Visit Note   [name]           Date of Birth: 11/01/1960           MRN: [id number] Visit Date: 10/28/2021              Requested by: [name], [name], MD [address] [place],  [place] [address] PCP: [name], [name], MD   Assessment & Plan: Visit Diagnoses:  1. Inflammatory arthritis   2. Mixed connective tissue disease ([place])     Plan: Right and left knee intra-articular injection performed which she tolerated well.  Follow-Up Instructions: No follow-ups on file.   Orders:  Orders Placed This Encounter  Procedures   Large Joint Inj   Large Joint Inj   No orders of the defined types were placed in this encounter.     Procedures: Large Joint Inj: R knee on 10/28/2021 3:24 PM Indications: pain and joint swelling Details: 22 G 1.5 in needle, anterolateral approach  Arthrogram: No  Medications: 40 mg methylPREDNISolone acetate 40 MG/ML; 0.5 mL lidocaine 1 %; 4 mL bupivacaine 0.25 % Outcome: tolerated well, no immediate complications Procedure, treatment alternatives, risks and benefits explained, specific risks discussed. Consent was given by the patient. Immediately prior to procedure a time out was called to verify the correct patient, procedure, equipment, support staff and site/side marked as required. Patient was prepped and draped in the usual sterile fashion.    Large Joint Inj: L knee on 10/28/2021 3:24 PM Indications: joint swelling and pain Details: 22 G 1.5 in needle, anterolateral approach  Arthrogram: No  Medications: 0.5 mL lidocaine 1 %; 3 mL bupivacaine 0.5 %; 40 mg methylPREDNISolone acetate 40 MG/ML Outcome: tolerated well, no immediate complications Procedure, treatment alternatives, risks and benefits explained, specific risks discussed. Consent was given by the patient. Immediately prior to procedure a time out was called to verify the correct patient, procedure, equipment, support staff and site/side marked as  required. Patient was prepped and draped in the usual sterile fashion.       Clinical Data: No additional findings.   Subjective: Chief Complaint  Patient presents with   Right Knee - Pain   Left Knee - Pain    HPI 61 year old female seen with bilateral knee pain.  She states she has been diagnosed with lupus also has rheumatoid arthritis and is on methotrexate, Voltaren gel, Tylenol.  Patient has been seeing Dr. Sharol Given but requested to come in to my clinic since she cannot get get into see Dr. Sharol Given and is requesting bilateral knee injections.  Previous injection at the beginning of the summer gave her good relief.  Patient states she is lost 40 pounds.  Review of Systems updated and noncontributory to HPI.   Objective: Vital Signs: BP 129/84   Pulse 90   Ht 5\' 5"  (1.651 m)   Wt 201 lb (91.2 kg)   BMI 33.45 kg/m   Physical Exam Constitutional:      Appearance: She is well-developed.  HENT:     Head: Normocephalic.     Right Ear: External ear normal.     Left Ear: External ear normal. There is no impacted cerumen.  Eyes:     Pupils: Pupils are equal, round, and reactive to light.  Neck:     Thyroid: No thyromegaly.     Trachea: No tracheal deviation.  Cardiovascular:     Rate and Rhythm: Normal rate.  Pulmonary:     Effort: Pulmonary  effort is normal.  Abdominal:     Palpations: Abdomen is soft.  Musculoskeletal:     Cervical back: No rigidity.  Skin:    General: Skin is warm and dry.  Neurological:     Mental Status: She is alert and oriented to person, place, and time.  Psychiatric:        Behavior: Behavior normal.     Ortho Exam bilateral medial more than lateral joint line tenderness.  Some crepitus knee extension slight knee effusion negative logroll to hips.  Specialty Comments:  No specialty comments available.  Imaging: No results found.   PMFS History: Patient Active Problem List   Diagnosis Date Noted   Moderate obstructive sleep apnea  07/11/2021   Daytime sleepiness 03/20/2021   Prediabetes 03/19/2021   Hypokalemia 07/07/2019   Precordial chest pain 05/04/2019   Educated about COVID-19 virus infection 05/04/2019   Persistent vomiting    Abdominal pain, epigastric    Nausea & vomiting 03/11/2019   Gastritis    Nausea without vomiting 02/28/2019   Class 2 obesity with body mass index (BMI) of 37.0 to 37.9 in adult 11/25/2018   Mixed connective tissue disease ([place]) 08/14/2017   Recurrent genital herpes 05/12/2017   Shortness of breath 04/15/2017   Dizziness 02/04/2017   Situational anxiety 06/08/2016   Atypical chest pain 06/08/2016   Trapezius muscle spasm 04/23/2016   Fibromyalgia 02/13/2016   Primary insomnia 02/13/2016   Chronic fatigue 02/13/2016   Vitamin D deficiency 11/25/2015   Autoimmune disease ([place]) 11/23/2015   High risk medication use 11/23/2015   Colon cancer screening 01/25/2014   Anterior neck pain 12/01/2013   Inflammatory arthritis 08/18/2012   Allergic rhinitis 06/23/2012   HTN (hypertension) 06/23/2012   Hyperlipidemia 06/23/2012   Thyroid nodule - benign 06/23/2012   Arthralgia 06/23/2012   Mild intermittent asthma    Sickle cell trait ([place])    History of stomach ulcers    ESOPHAGEAL STRICTURE 10/04/2007   GERD 10/04/2007   Atrophic 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   Dysphagia, pharyngoesophageal phase 10/04/2007   Past Medical History:  Diagnosis Date   Allergy    Anemia    Asthma    Blood transfusion without reported diagnosis    Fibromyalgia    GERD (gastroesophageal reflux disease)    Heart murmur    High cholesterol    History of stomach ulcers    Hypertension    Lupus ([place])    MI, old    Moderate obstructive sleep apnea 07/11/2021   Osteoarthritis    Rheumatoid arthritis ([place])    Thyroid disease    Thyroid mass    Vertigo     Family History  Problem Relation Age of Onset   Alcohol abuse Father    Hypertension Father    Heart disease Father    Other Mother         tobacco use disorder, refuses to see a doctor   Osteoporosis Sister    Colon cancer Maternal Uncle    Diabetes Maternal Grandmother    Healthy Son    Healthy Daughter    Multiple sclerosis Daughter    Colon polyps Neg Hx    Esophageal cancer Neg Hx    Kidney disease Neg Hx    Stomach cancer Neg Hx    Rectal cancer Neg Hx     Past Surgical History:  Procedure Laterality Date   ABDOMINAL HYSTERECTOMY     done for menorrhagia, ovaries remain   CERVICAL DISCECTOMY  plates and screws in neck   CESAREAN SECTION     COLONOSCOPY     ESOPHAGEAL MANOMETRY N/A 02/12/2014   Procedure: ESOPHAGEAL MANOMETRY (EM);  Surgeon: Inda Castle, MD;  Location: [facility];  Service: Endoscopy;  Laterality: N/A;   ESOPHAGOGASTRODUODENOSCOPY  03/04/2011   Procedure: ESOPHAGOGASTRODUODENOSCOPY (EGD);  Surgeon: Inda Castle, MD;  Location: [facility];  Service: Endoscopy;  Laterality: N/A;  BOTOX Injection   ESOPHAGOGASTRODUODENOSCOPY (EGD) WITH PROPOFOL N/A 03/12/2019   Procedure: ESOPHAGOGASTRODUODENOSCOPY (EGD) WITH PROPOFOL;  Surgeon: Carol Ada, MD;  Location: [facility];  Service: Endoscopy;  Laterality: N/A;   exc benign breast lump     TONSILLECTOMY     UPPER GASTROINTESTINAL ENDOSCOPY     Social History   Occupational History   Occupation: Control and instrumentation engineer    Employer: [employer]  Tobacco Use   Smoking status: Never    Passive exposure: Never   Smokeless tobacco: Never  Vaping Use   Vaping Use: Never used  Substance and Sexual Activity   Alcohol use: Not Currently    Alcohol/week: 1.0 standard drink of alcohol    Types: 1 Glasses of wine per week    Comment: 1 glass every 3 months   Drug use: No   Sexual activity: Yes    Birth control/protection: None, Surgical    Comment: LAVH

## 2021-10-28 NOTE — Telephone Encounter (Signed)
Patient needed note that said she was treated in the office today. Note entered. She will print from My Chart.

## 2021-10-28 NOTE — Telephone Encounter (Signed)
Pt had an appt today and forgot to ask for a work note. Please send to pt mychart appt. Pt phone number is [phone].

## 2021-11-05 NOTE — Telephone Encounter (Signed)
Does she have someone who will take over management of her sleep apnea? She just needs to ensure that they are comfortable/willing to manage this. Thanks!

## 2021-11-06 NOTE — Progress Notes (Deleted)
Office Visit Note  [name]             Date of Birth: 1960/07/31           MRN: [id number]             PCP: [name], [name], MD Referring: [name], [name], MD Visit Date: 11/19/2021 Occupation: @GUAROCC @  Subjective:  No chief complaint on file.   History of Present Illness: Patricia Reed is a 61 y.o. female ***   Activities of Daily Living:  Patient reports morning stiffness for *** {minute/hour:19697}.   Patient {ACTIONS;DENIES/REPORTS:21021675::"Denies"} nocturnal pain.  Difficulty dressing/grooming: {ACTIONS;DENIES/REPORTS:21021675::"Denies"} Difficulty climbing stairs: {ACTIONS;DENIES/REPORTS:21021675::"Denies"} Difficulty getting out of chair: {ACTIONS;DENIES/REPORTS:21021675::"Denies"} Difficulty using hands for taps, buttons, cutlery, and/or writing: {ACTIONS;DENIES/REPORTS:21021675::"Denies"}  No Rheumatology ROS completed.   PMFS History:  Patient Active Problem List   Diagnosis Date Noted  . Moderate obstructive sleep apnea 07/11/2021  . Daytime sleepiness 03/20/2021  . Prediabetes 03/19/2021  . Hypokalemia 07/07/2019  . Precordial chest pain 05/04/2019  . Educated about COVID-19 virus infection 05/04/2019  . Persistent vomiting   . Abdominal pain, epigastric   . Nausea & vomiting 03/11/2019  . Gastritis   . Nausea without vomiting 02/28/2019  . Class 2 obesity with body mass index (BMI) of 37.0 to 37.9 in adult 11/25/2018  . Mixed connective tissue disease ([place]) 08/14/2017  . Recurrent genital herpes 05/12/2017  . Shortness of breath 04/15/2017  . Dizziness 02/04/2017  . Situational anxiety 06/08/2016  . Atypical chest pain 06/08/2016  . Trapezius muscle spasm 04/23/2016  . Fibromyalgia 02/13/2016  . 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 Chronic fatigue 02/13/2016  . Vitamin D deficiency 11/25/2015  . Autoimmune disease ([place]) 11/23/2015  . High risk medication use 11/23/2015  . Colon cancer screening 01/25/2014  . Anterior neck  pain 12/01/2013  . Inflammatory arthritis 08/18/2012  . Allergic rhinitis 06/23/2012  . HTN (hypertension) 06/23/2012  . Hyperlipidemia 06/23/2012  . Thyroid nodule - benign 06/23/2012  . Arthralgia 06/23/2012  . Mild intermittent asthma   . Sickle cell trait ([place])   . History of stomach ulcers   . ESOPHAGEAL STRICTURE 10/04/2007  . GERD 10/04/2007  . 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 Dysphagia, pharyngoesophageal phase 10/04/2007    Past Medical History:  Diagnosis Date  . Allergy   . Anemia   . Asthma   . Blood transfusion without reported diagnosis   . Fibromyalgia   . GERD (gastroesophageal reflux disease)   . Heart murmur   . High cholesterol   . History of stomach ulcers   . Hypertension   . Lupus ([place])   . MI, old   . Moderate obstructive sleep apnea 07/11/2021  . Osteoarthritis   . Rheumatoid arthritis ([place])   . Thyroid disease   . Thyroid mass   . Vertigo     Family History  Problem Relation Age of Onset  . Alcohol abuse Father   . Hypertension Father   . Heart disease Father   . Other Mother        tobacco use disorder, refuses to see a doctor  . Osteoporosis Sister   . Colon cancer Maternal Uncle   . Diabetes Maternal Grandmother   . Healthy Son   . Healthy Daughter   . Multiple sclerosis Daughter   . Colon polyps Neg Hx   . Esophageal cancer Neg Hx   . Kidney disease Neg Hx   . Stomach cancer Neg Hx   . Rectal cancer  Neg Hx    Past Surgical History:  Procedure Laterality Date  . ABDOMINAL HYSTERECTOMY     done for menorrhagia, ovaries remain  . CERVICAL DISCECTOMY     plates and screws in neck  . CESAREAN SECTION    . COLONOSCOPY    . ESOPHAGEAL MANOMETRY N/A 02/12/2014   Procedure: ESOPHAGEAL MANOMETRY (EM);  Surgeon: Inda Castle, MD;  Location: [facility];  Service: Endoscopy;  Laterality: N/A;  . ESOPHAGOGASTRODUODENOSCOPY  03/04/2011   Procedure: ESOPHAGOGASTRODUODENOSCOPY (EGD);  Surgeon: Inda Castle, MD;  Location: [facility];  Service: Endoscopy;  Laterality: N/A;  BOTOX Injection  . ESOPHAGOGASTRODUODENOSCOPY (EGD) WITH PROPOFOL N/A 03/12/2019   Procedure: ESOPHAGOGASTRODUODENOSCOPY (EGD) WITH PROPOFOL;  Surgeon: Carol Ada, MD;  Location: [facility];  Service: Endoscopy;  Laterality: N/A;  . exc benign breast lump    . TONSILLECTOMY    . 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     Immunization History  Administered Date(s) Administered  . Influenza Inj Mdck Quad Pf 10/22/2020  . Influenza Split 11/04/2015, 10/11/2017, 10/21/2018  . Influenza, Quadrivalent, Recombinant, Inj, Pf 10/11/2017  . Influenza,inj,Quad PF,6+ Mos 11/04/2015, 10/21/2018, 09/23/2021  . Influenza-Unspecified 10/11/2017  . PFIZER Comirnaty(Gray Top)Covid-19 Tri-Sucrose Vaccine 05/10/2020  . PFIZER(Purple Top)SARS-COV-2 Vaccination 03/20/2019, 04/12/2019, 12/07/2019, 05/10/2020  . PNEUMOCOCCAL CONJUGATE-20 10/22/2020  . Pneumococcal Polysaccharide-23 01/09/2020  . Tdap 08/20/2020  . Zoster Recombinat (Shingrix) 10/11/2017, 12/25/2017, 01/09/2020  . Zoster, Unspecified 10/11/2017, 12/25/2017     Objective: Vital Signs: There were no vitals taken for this visit.   Physical Exam   Musculoskeletal Exam: ***  CDAI Exam: CDAI Score: -- Patient Global: --; Provider Global: -- Swollen: --; Tender: -- Joint Exam 11/19/2021   No joint exam has been documented for this visit   There is currently no information documented on the homunculus. Go to the Rheumatology activity and complete the homunculus joint exam.  Investigation: No additional findings.  Imaging: No results found.  Recent Labs: Lab Results  Component Value Date   WBC 5.8 09/23/2021   HGB 13.3 09/23/2021   PLT 359 09/23/2021   NA 139 09/23/2021   K 4.3 09/23/2021   CL 98 09/23/2021   CO2 28 09/23/2021   GLUCOSE 101 (H)  09/23/2021   BUN 18 09/23/2021   CREATININE 0.92 09/23/2021   BILITOT 0.6 09/23/2021   ALKPHOS 55 03/16/2021   AST 14 09/23/2021   ALT 16 09/23/2021   PROT 7.8 09/23/2021   ALBUMIN 4.3 03/16/2021   CALCIUM 9.7 09/23/2021   GFRAA 97 06/24/2020   QFTBGOLDPLUS NEGATIVE 05/15/2021    Speciality Comments: No specialty comments available.  Procedures:  No procedures performed Allergies: Aspirin, Hydrocodone-acetaminophen, Meperidine hcl, Morphine, Oxycodone-acetaminophen, Ibuprofen, Codeine, Demerol [meperidine], Imdur [isosorbide nitrate], Isosorbide dinitrate, Procaine hcl, Toprol xl [metoprolol], and Tramadol   Assessment / Plan:     Visit Diagnoses: No diagnosis found.  Orders: No orders of the defined types were placed in this encounter.  No orders of the defined types were placed in this encounter.   Face-to-face time spent with patient was *** minutes. Greater than 50% of time was spent in counseling and coordination of care.  Follow-Up Instructions: No follow-ups on file.   Earnestine Mealing, CMA  Note - This record has been created using Editor, commissioning.  Chart creation errors have been sought, but may not always  have been  located. Such creation errors do not reflect on  the standard of medical care.

## 2021-11-17 ENCOUNTER — Telehealth: Payer: Self-pay | Admitting: Family Medicine

## 2021-11-17 ENCOUNTER — Telehealth: Payer: Self-pay | Admitting: *Deleted

## 2021-11-17 NOTE — Telephone Encounter (Signed)
Took a covid shot yesterday, is feeling awful. Just wants to make sure this is normal

## 2021-11-17 NOTE — Telephone Encounter (Signed)
Patient contacted the office stating she had a Covid vaccine. Patient states she is not feeling well. Patient states she has no energy. Patient advised to reach out to her PCP for guidance and evaluation. Patient expressed understanding.

## 2021-11-18 NOTE — Telephone Encounter (Signed)
It is not uncommon after COVID-vaccine and can last a few days. Thanks, BJ

## 2021-11-19 ENCOUNTER — Ambulatory Visit: Payer: BC Managed Care – PPO | Admitting: Physician Assistant

## 2021-11-19 DIAGNOSIS — Z8709 Personal history of other diseases of the respiratory system: Secondary | ICD-10-CM

## 2021-11-19 DIAGNOSIS — Z8711 Personal history of peptic ulcer disease: Secondary | ICD-10-CM

## 2021-11-19 DIAGNOSIS — Z8679 Personal history of other diseases of the circulatory system: Secondary | ICD-10-CM

## 2021-11-19 DIAGNOSIS — Z13820 Encounter for screening for osteoporosis: Secondary | ICD-10-CM

## 2021-11-19 DIAGNOSIS — M351 Other overlap syndromes: Secondary | ICD-10-CM

## 2021-11-19 DIAGNOSIS — R0789 Other chest pain: Secondary | ICD-10-CM

## 2021-11-19 DIAGNOSIS — G8929 Other chronic pain: Secondary | ICD-10-CM

## 2021-11-19 DIAGNOSIS — R5382 Chronic fatigue, unspecified: Secondary | ICD-10-CM

## 2021-11-19 DIAGNOSIS — M170 Bilateral primary osteoarthritis of knee: Secondary | ICD-10-CM

## 2021-11-19 DIAGNOSIS — M7061 Trochanteric bursitis, right hip: Secondary | ICD-10-CM

## 2021-11-19 DIAGNOSIS — Z8639 Personal history of other endocrine, nutritional and metabolic disease: Secondary | ICD-10-CM

## 2021-11-19 DIAGNOSIS — G4733 Obstructive sleep apnea (adult) (pediatric): Secondary | ICD-10-CM

## 2021-11-19 DIAGNOSIS — F5101 Primary insomnia: Secondary | ICD-10-CM

## 2021-11-19 DIAGNOSIS — Z8719 Personal history of other diseases of the digestive system: Secondary | ICD-10-CM

## 2021-11-19 DIAGNOSIS — Z7952 Long term (current) use of systemic steroids: Secondary | ICD-10-CM

## 2021-11-19 DIAGNOSIS — M797 Fibromyalgia: Secondary | ICD-10-CM

## 2021-11-19 DIAGNOSIS — Z79899 Other long term (current) drug therapy: Secondary | ICD-10-CM

## 2021-11-19 NOTE — Telephone Encounter (Signed)
I tried contacting patient, but mailbox is full.

## 2021-11-24 ENCOUNTER — Other Ambulatory Visit: Payer: Self-pay | Admitting: Physician Assistant

## 2021-11-24 ENCOUNTER — Other Ambulatory Visit: Payer: Self-pay | Admitting: Family Medicine

## 2021-11-24 DIAGNOSIS — K219 Gastro-esophageal reflux disease without esophagitis: Secondary | ICD-10-CM

## 2021-11-24 NOTE — Telephone Encounter (Signed)
Please schedule patient a follow up visit. Patient due November 2023. Thanks!   Follow-Up Instructions: Return in about 3 months (around 11/19/2021) for Osteoarthritis, MCTD.

## 2021-11-24 NOTE — Telephone Encounter (Signed)
Next Visit: Due November 2023. Message sent to the front to schedule.   Last Visit: 08/19/2021  Last Fill: 11/18/2020  Dx:  Mixed connective tissue disease   Current Dose per office note on 09/21/2353: folic acid 2 mg daily   Okay to refill Folic Acid?

## 2021-11-25 NOTE — Telephone Encounter (Signed)
LMTCB

## 2021-12-02 ENCOUNTER — Encounter: Payer: Self-pay | Admitting: Family Medicine

## 2021-12-02 DIAGNOSIS — G4700 Insomnia, unspecified: Secondary | ICD-10-CM

## 2021-12-02 MED ORDER — ESZOPICLONE 3 MG PO TABS: 3.0000 mg | ORAL_TABLET | Freq: Every evening | ORAL | 1 refills | Status: DC | PRN

## 2021-12-05 ENCOUNTER — Ambulatory Visit: Payer: BC Managed Care – PPO | Admitting: Nurse Practitioner

## 2022-01-06 ENCOUNTER — Other Ambulatory Visit: Payer: Self-pay | Admitting: *Deleted

## 2022-01-06 ENCOUNTER — Ambulatory Visit (INDEPENDENT_AMBULATORY_CARE_PROVIDER_SITE_OTHER): Payer: BC Managed Care – PPO | Admitting: Physician Assistant

## 2022-01-06 ENCOUNTER — Encounter: Payer: Self-pay | Admitting: Physician Assistant

## 2022-01-06 ENCOUNTER — Telehealth: Payer: Self-pay | Admitting: Family

## 2022-01-06 ENCOUNTER — Ambulatory Visit (INDEPENDENT_AMBULATORY_CARE_PROVIDER_SITE_OTHER): Payer: BC Managed Care – PPO | Admitting: Family

## 2022-01-06 DIAGNOSIS — M25562 Pain in left knee: Secondary | ICD-10-CM

## 2022-01-06 DIAGNOSIS — G8929 Other chronic pain: Secondary | ICD-10-CM | POA: Diagnosis not present

## 2022-01-06 DIAGNOSIS — M170 Bilateral primary osteoarthritis of knee: Secondary | ICD-10-CM | POA: Diagnosis not present

## 2022-01-06 DIAGNOSIS — M25561 Pain in right knee: Secondary | ICD-10-CM | POA: Diagnosis not present

## 2022-01-06 DIAGNOSIS — Z79899 Other long term (current) drug therapy: Secondary | ICD-10-CM

## 2022-01-06 NOTE — Telephone Encounter (Signed)
I spoke with the pt earlier and she said that her knee is swollen several times the size and that the painis horrible I advised her to get in the car and come tot the office. I will work her in with some one here.

## 2022-01-06 NOTE — Telephone Encounter (Signed)
Patient called. Says since being seen this AM, her leg is swollen. Would like to know what to do. Would like a call back to [phone]

## 2022-01-06 NOTE — Progress Notes (Signed)
HPI: Patient comes in today after having bilateral cortisone injections earlier this morning.  She states that her left knee is swelling she is having pain in the knee.  Pains radiating down the knee to the left dorsal foot.  She notes that she has numbness in her left dorsal foot that was not there prior to the injection.  She is not able to bear weight on the left leg because of the pain in the knee.  Physical exam: Left knee she is able to do a straight leg raise.  She has limited range of motion of the knee secondary to pain.  There is no abnormal warmth erythema.  She has diffuse tenderness about the knee.  Left calf supple nontender.  Left foot dorsal pedal pulses intact.  She is able to wiggle her toes.  She has full dorsiflexion plantarflexion ankle.  Subjective decrease sensation over the superficial peroneal nerve region of the left foot otherwise sensation intact.  Impression: Left knee pain status post injection  Plan: Discussed with the patient that this most likely represents irritation of Hoffa's fat pad.  Recommend ice and elevation use of topical Voltaren gel.  She is unable to take narcotics therefore we gave her a dose of Toradol here in the office.  Will check on her tomorrow to see how she is doing.  Questions were encouraged and answered at length.  She will continue to take her Tylenol.

## 2022-01-07 ENCOUNTER — Encounter: Payer: Self-pay | Admitting: Family

## 2022-01-07 DIAGNOSIS — M25561 Pain in right knee: Secondary | ICD-10-CM | POA: Diagnosis not present

## 2022-01-07 DIAGNOSIS — M25562 Pain in left knee: Secondary | ICD-10-CM

## 2022-01-07 LAB — COMPLETE METABOLIC PANEL WITH GFR
AG Ratio: 1.4 (calc) (ref 1.0–2.5)
ALT: 14 U/L (ref 6–29)
AST: 18 U/L (ref 10–35)
Albumin: 4.5 g/dL (ref 3.6–5.1)
Alkaline phosphatase (APISO): 63 U/L (ref 37–153)
BUN: 12 mg/dL (ref 7–25)
CO2: 32 mmol/L (ref 20–32)
Calcium: 9.8 mg/dL (ref 8.6–10.4)
Chloride: 100 mmol/L (ref 98–110)
Creat: 0.8 mg/dL (ref 0.50–1.03)
Globulin: 3.2 g/dL (calc) (ref 1.9–3.7)
Glucose, Bld: 100 mg/dL — ABNORMAL HIGH (ref 65–99)
Potassium: 4 mmol/L (ref 3.5–5.3)
Sodium: 141 mmol/L (ref 135–146)
Total Bilirubin: 0.5 mg/dL (ref 0.2–1.2)
Total Protein: 7.7 g/dL (ref 6.1–8.1)
eGFR: 85 mL/min/{1.73_m2} (ref >=60)

## 2022-01-07 LAB — CBC WITH DIFFERENTIAL/PLATELET
Absolute Monocytes: 352 cells/uL (ref 200–950)
Basophils Absolute: 30 cells/uL (ref 0–200)
Basophils Relative: 0.8 %
Eosinophils Absolute: 70 cells/uL (ref 15–500)
Eosinophils Relative: 1.9 %
HCT: 36.6 % (ref 35.0–45.0)
Hemoglobin: 12.2 g/dL (ref 11.7–15.5)
Lymphs Abs: 1077 cells/uL (ref 850–3900)
MCH: 30.2 pg (ref 27.0–33.0)
MCHC: 33.3 g/dL (ref 32.0–36.0)
MCV: 90.6 fL (ref 80.0–100.0)
MPV: 10.5 fL (ref 7.5–12.5)
Monocytes Relative: 9.5 %
Neutro Abs: 2172 cells/uL (ref 1500–7800)
Neutrophils Relative %: 58.7 %
Platelets: 300 10*3/uL (ref 140–400)
RBC: 4.04 10*6/uL (ref 3.80–5.10)
RDW: 13.7 % (ref 11.0–15.0)
Total Lymphocyte: 29.1 %
WBC: 3.7 10*3/uL — ABNORMAL LOW (ref 3.8–10.8)

## 2022-01-07 MED ORDER — METHYLPREDNISOLONE ACETATE 40 MG/ML IJ SUSP
40.0000 mg | INTRAMUSCULAR | Status: AC | PRN
  Administered 2022-01-07: 40 mg via INTRA_ARTICULAR

## 2022-01-07 MED ORDER — LIDOCAINE HCL 1 % IJ SOLN
5.0000 mL | INTRAMUSCULAR | Status: AC | PRN
  Administered 2022-01-07: 5 mL

## 2022-01-07 NOTE — Progress Notes (Signed)
CBC shows mildly decreased white cell count.  We will continue to monitor WBC count.  CMP is stable.

## 2022-01-07 NOTE — Progress Notes (Signed)
Office Visit Note   [name]           Date of Birth: 04-03-1960           MRN: [id number] Visit Date: 01/06/2022              Requested by: [name], [name], MD [address] [place],  [place] [address] PCP: [name], [name], MD  No chief complaint on file.     HPI: The patient is a 61 year old woman seen today for concern of bilateral chronic knee pain.  Denies mechanical symptoms having grinding and popping in the right knee.  Requesting repeat injections.  She last received injections October 10.  She has had return of her symptoms in bilateral knees  Does have a history of lupus and rheumatoid arthritis for which she is using methotrexate, Voltaren gel and Tylenol.  Has previously had good interval relief with her Depo-Medrol injections.  Assessment & Plan: Visit Diagnoses:  1. Chronic pain of both knees   2. Bilateral primary osteoarthritis of knee     Plan: Depo-Medrol injection bilateral knees.  Patient tolerated well.  Discussed that we will do these no sooner than q 3 months moving forward.  Follow-Up Instructions: No follow-ups on file.   Ortho Exam  Patient is alert, oriented, no adenopathy, well-dressed, normal affect, normal respiratory effort. On examination bilateral knees patient has crepitation with range of motion bilateral knees.  Her collaterals and cruciates are stable bilaterally.  She does have tenderness to palpation over the medial joint line of left greater than right knee.  Imaging: No results found. No images are attached to the encounter.  Labs: Lab Results  Component Value Date   HGBA1C 6.1 03/19/2021   HGBA1C 6.2 08/20/2020   ESRSEDRATE 31 (H) 09/23/2021   ESRSEDRATE 36 (H) 05/15/2021   ESRSEDRATE 17 06/24/2020   CRP 0.6 10/11/2015   REPTSTATUS 02/10/2013 FINAL 02/08/2013   CULT  02/08/2013    No Beta Hemolytic Streptococci Isolated Performed at [facility]     Lab Results  Component Value Date    ALBUMIN 4.3 03/16/2021   ALBUMIN 3.9 05/27/2019   ALBUMIN 3.9 03/11/2019    Lab Results  Component Value Date   MG 1.8 03/16/2021   MG 2.2 03/14/2019   MG 1.7 03/13/2019   Lab Results  Component Value Date   VD25OH 44 01/22/2020   VD25OH 36 03/30/2017   VD25OH 38 08/19/2016    No results found for: "PREALBUMIN"    Latest Ref Rng & Units 01/06/2022   11:14 AM 09/23/2021    3:53 PM 08/06/2021    2:50 PM  CBC EXTENDED  WBC 3.8 - 10.8 Thousand/uL 3.7  5.8  4.1   RBC 3.80 - 5.10 Million/uL 4.04  4.35  4.13   Hemoglobin 11.7 - 15.5 g/dL 12.2  13.3  12.6   HCT 35.0 - 45.0 % 36.6  39.9  38.3   Platelets 140 - 400 Thousand/uL 300  359  299   NEUT# 1,500 - 7,800 cells/uL 2,172  4,814  2,874   Lymph# 850 - 3,900 cells/uL 1,077  667  734      There is no height or weight on file to calculate BMI.  Orders:  Orders Placed This Encounter  Procedures   Large Joint Inj   No orders of the defined types were placed in this encounter.    Procedures: Large Joint Inj: bilateral knee on 01/07/2022 10:44 AM Indications: pain  Details: 18 G 1.5 in needle, anteromedial approach Medications (Right): 5 mL lidocaine 1 %; 40 mg methylPREDNISolone acetate 40 MG/ML Medications (Left): 5 mL lidocaine 1 %; 40 mg methylPREDNISolone acetate 40 MG/ML Outcome: tolerated well, no immediate complications Consent was given by the patient.      Clinical Data: No additional findings.  ROS:  All other systems negative, except as noted in the HPI. Review of Systems  Constitutional:  Negative for chills and fever.  Musculoskeletal:  Positive for arthralgias. Negative for gait problem and myalgias.    Objective: Vital Signs: There were no vitals taken for this visit.  Specialty Comments:  No specialty comments available.  PMFS History: Patient Active Problem List   Diagnosis Date Noted   Moderate obstructive sleep apnea 07/11/2021   Daytime sleepiness 03/20/2021   Prediabetes  03/19/2021   Hypokalemia 07/07/2019   Precordial chest pain 05/04/2019   Educated about COVID-19 virus infection 05/04/2019   Persistent vomiting    Abdominal pain, epigastric    Nausea & vomiting 03/11/2019   Gastritis    Nausea without vomiting 02/28/2019   Class 2 obesity with body mass index (BMI) of 37.0 to 37.9 in adult 11/25/2018   Mixed connective tissue disease ([place]) 08/14/2017   Recurrent genital herpes 05/12/2017   Shortness of breath 04/15/2017   Dizziness 02/04/2017   Situational anxiety 06/08/2016   Atypical chest pain 06/08/2016   Trapezius muscle spasm 04/23/2016   Fibromyalgia 02/13/2016   Primary insomnia 02/13/2016   Chronic fatigue 02/13/2016   Vitamin D deficiency 11/25/2015   Autoimmune disease ([place]) 11/23/2015   High risk medication use 11/23/2015   Colon cancer screening 01/25/2014   Anterior neck pain 12/01/2013   Inflammatory arthritis 08/18/2012   Allergic rhinitis 06/23/2012   HTN (hypertension) 06/23/2012   Hyperlipidemia 06/23/2012   Thyroid nodule - benign 06/23/2012   Arthralgia 06/23/2012   Mild intermittent asthma    Sickle cell trait ([place])    History of stomach ulcers    ESOPHAGEAL STRICTURE 10/04/2007   GERD 10/04/2007   Atrophic 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   Dysphagia, pharyngoesophageal phase 10/04/2007   Past Medical History:  Diagnosis Date   Allergy    Anemia    Asthma    Blood transfusion without reported diagnosis    Fibromyalgia    GERD (gastroesophageal reflux disease)    Heart murmur    High cholesterol    History of stomach ulcers    Hypertension    Lupus ([place])    MI, old    Moderate obstructive sleep apnea 07/11/2021   Osteoarthritis    Rheumatoid arthritis ([place])    Thyroid disease    Thyroid mass    Vertigo     Family History  Problem Relation Age of Onset   Alcohol abuse Father    Hypertension Father    Heart disease Father    Other Mother        tobacco use disorder, refuses to see a doctor   Osteoporosis  Sister    Colon cancer Maternal Uncle    Diabetes Maternal Grandmother    Healthy Son    Healthy Daughter    Multiple sclerosis Daughter    Colon polyps Neg Hx    Esophageal cancer Neg Hx    Kidney disease Neg Hx    Stomach cancer Neg Hx    Rectal cancer Neg Hx     Past Surgical History:  Procedure Laterality Date   ABDOMINAL HYSTERECTOMY     done for menorrhagia,  ovaries remain   CERVICAL DISCECTOMY     plates and screws in neck   CESAREAN SECTION     COLONOSCOPY     ESOPHAGEAL MANOMETRY N/A 02/12/2014   Procedure: ESOPHAGEAL MANOMETRY (EM);  Surgeon: Louis Meckel, MD;  Location: [facility];  Service: Endoscopy;  Laterality: N/A;   ESOPHAGOGASTRODUODENOSCOPY  03/04/2011   Procedure: ESOPHAGOGASTRODUODENOSCOPY (EGD);  Surgeon: Louis Meckel, MD;  Location: [facility];  Service: Endoscopy;  Laterality: N/A;  BOTOX Injection   ESOPHAGOGASTRODUODENOSCOPY (EGD) WITH PROPOFOL N/A 03/12/2019   Procedure: ESOPHAGOGASTRODUODENOSCOPY (EGD) WITH PROPOFOL;  Surgeon: Jeani Hawking, MD;  Location: [facility];  Service: Endoscopy;  Laterality: N/A;   exc benign breast lump     TONSILLECTOMY     UPPER GASTROINTESTINAL ENDOSCOPY     Social History   Occupational History   Occupation: Geologist, engineering    Employer: [employer]  Tobacco Use   Smoking status: Never    Passive exposure: Never   Smokeless tobacco: Never  Vaping Use   Vaping Use: Never used  Substance and Sexual Activity   Alcohol use: Not Currently    Alcohol/week: 1.0 standard drink of alcohol    Types: 1 Glasses of wine per week    Comment: 1 glass every 3 months   Drug use: No   Sexual activity: Yes    Birth control/protection: None, Surgical    Comment: LAVH

## 2022-01-08 ENCOUNTER — Telehealth: Payer: Self-pay | Admitting: Physician Assistant

## 2022-01-08 ENCOUNTER — Telehealth: Payer: Self-pay | Admitting: Family

## 2022-01-08 ENCOUNTER — Telehealth: Payer: Self-pay | Admitting: Orthopedic Surgery

## 2022-01-08 NOTE — Telephone Encounter (Signed)
Patient called in stating Bronson Curb was supposed to give her Toradol and the nurse has gave her Tramadol and she knows because it has happened before and she is having the same symptoms being she is allergic to Narcotics. Nurse was Morrie Sheldon she stated, please advise

## 2022-01-08 NOTE — Telephone Encounter (Signed)
Patricia Reed was transferred to me from the front desk because she asked to speak with a Production designer, theatre/television/film.  She discussed what happened at her past few appointments.  Starting with her appointment with Barnie Del, NP where she asked for injections in both her knees.  She advised me that Denny Peon did the right knee correct, but the left knee went into her "fatty pad". She ended up coming back in to the clinic to see Rexene Edison, PA-C.  He ordered patient a shot of Toradol since she is allergic to most other pain medications, especially narcotics.  She feels like the medical assistant gave her Tramadol instead because she has been having adverse reactions since receiving the injection.  She is complaining of terrible itching and pain.  She would like to see if Bronson Curb would call her in a "prednisone taper" to help with her issue.  She uses the [facility] in [place].  Advised that I would forward this message to the clinic supervisor, Prescott Parma, to investigate if the injection medication could have been "mixed up". She can be reached at [phone].

## 2022-01-08 NOTE — Telephone Encounter (Signed)
IC and talked with patient.  Explained to her that we do not keep tramadol here in our office. She verbalized understanding.

## 2022-01-08 NOTE — Telephone Encounter (Signed)
Patient aware that it was NOT a narcotic, it was Toradol. We ONLY have Toradol here Told her to take benadryl for her itching

## 2022-01-27 ENCOUNTER — Ambulatory Visit: Payer: BC Managed Care – PPO | Admitting: Orthopaedic Surgery

## 2022-01-27 NOTE — Progress Notes (Signed)
Office Visit Note  [name]             Date of Birth: 12/15/1960           MRN: [id number]             PCP: [place], [name], MD Referring: [place], [name], MD Visit Date: 02/03/2022 Occupation: @GUAROCC @  Subjective:  Left shoulder pain  History of Present Illness: Patricia Reed is a 62 y.o. female history of mixed connective tissue disease, osteoarthritis and fibromyalgia syndrome.  She returns today after her last visit on August 19, 2021.  She states in December she had cortisone injection to bilateral knee joints and developed an allergic reaction to her left knee joint.  She states she developed severe swelling after the cortisone shot.  She was given Toradol and had an allergic reaction to Toradol.  She states eventually the symptoms resolved.  Her knee joints are not bothering now.  She states she has intermittent popping off the both shoulder joints.  She has been experiencing increased discomfort in her left shoulder and requested cortisone injection.  She continues to have some generalized pain and discomfort from fibromyalgia.    Activities of Daily Living:  Patient reports morning stiffness for 3.5-4 hours.   Patient Reports nocturnal pain.  Difficulty dressing/grooming: Reports Difficulty climbing stairs: Denies Difficulty getting out of chair: Denies Difficulty using hands for taps, buttons, cutlery, and/or writing: Reports  Review of Systems  Constitutional:  Positive for fatigue.  HENT:  Positive for mouth dryness. Negative for mouth sores.   Eyes:  Negative for dryness.  Respiratory:  Negative for shortness of breath.   Cardiovascular:  Negative for palpitations.  Gastrointestinal:  Negative for blood in stool, constipation and diarrhea.  Endocrine: Negative for increased urination.  Genitourinary:  Negative for involuntary urination.  Musculoskeletal:  Positive for joint pain, joint pain, myalgias, morning stiffness and myalgias. Negative  for gait problem, joint swelling, muscle weakness and muscle tenderness.  Skin:  Negative for color change, rash, hair loss and sensitivity to sunlight.  Allergic/Immunologic: Negative for susceptible to infections.  Neurological:  Negative for dizziness and headaches.  Hematological:  Negative for swollen glands.  Psychiatric/Behavioral:  Positive for sleep disturbance. Negative for depressed mood. The patient is not nervous/anxious.     PMFS History:  Patient Active Problem List   Diagnosis Date Noted   Moderate obstructive sleep apnea 07/11/2021   Daytime sleepiness 03/20/2021   Prediabetes 03/19/2021   Hypokalemia 07/07/2019   Precordial chest pain 05/04/2019   Educated about COVID-19 virus infection 05/04/2019   Persistent vomiting    Abdominal pain, epigastric    Nausea & vomiting 03/11/2019   Gastritis    Nausea without vomiting 02/28/2019   Class 2 obesity with body mass index (BMI) of 37.0 to 37.9 in adult 11/25/2018   Mixed connective tissue disease (HCC) 08/14/2017   Recurrent genital herpes 05/12/2017   Shortness of breath 04/15/2017   Dizziness 02/04/2017   Situational anxiety 06/08/2016   Atypical chest pain 06/08/2016   Trapezius muscle spasm 04/23/2016   Fibromyalgia 02/13/2016   Primary insomnia 02/13/2016   Chronic fatigue 02/13/2016   Vitamin D deficiency 11/25/2015   Autoimmune disease (HCC) 11/23/2015   High risk medication use 11/23/2015   Colon cancer screening 01/25/2014   Anterior neck pain 12/01/2013   Inflammatory arthritis 08/18/2012   Allergic rhinitis 06/23/2012   HTN (hypertension) 06/23/2012   Hyperlipidemia 06/23/2012   Thyroid nodule - benign  06/23/2012   Arthralgia 06/23/2012   Mild intermittent asthma    Sickle cell trait (HCC)    History of stomach ulcers    ESOPHAGEAL STRICTURE 10/04/2007   GERD 10/04/2007   Atrophic 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   Dysphagia, pharyngoesophageal phase 10/04/2007    Past Medical History:  Diagnosis Date    Allergy    Anemia    Asthma    Blood transfusion without reported diagnosis    Fibromyalgia    GERD (gastroesophageal reflux disease)    Heart murmur    High cholesterol    History of stomach ulcers    Hypertension    Lupus (HCC)    MI, old    Moderate obstructive sleep apnea 07/11/2021   Osteoarthritis    Rheumatoid arthritis (HCC)    Thyroid disease    Thyroid mass    Vertigo     Family History  Problem Relation Age of Onset   Alcohol abuse Father    Hypertension Father    Heart disease Father    Other Mother        tobacco use disorder, refuses to see a doctor   Osteoporosis Sister    Colon cancer Maternal Uncle    Diabetes Maternal Grandmother    Healthy Son    Healthy Daughter    Multiple sclerosis Daughter    Colon polyps Neg Hx    Esophageal cancer Neg Hx    Kidney disease Neg Hx    Stomach cancer Neg Hx    Rectal cancer Neg Hx    Past Surgical History:  Procedure Laterality Date   ABDOMINAL HYSTERECTOMY     done for menorrhagia, ovaries remain   CERVICAL DISCECTOMY     plates and screws in neck   CESAREAN SECTION     COLONOSCOPY     ESOPHAGEAL MANOMETRY N/A 02/12/2014   Procedure: ESOPHAGEAL MANOMETRY (EM);  Surgeon: Louis Meckel, MD;  Location: [facility];  Service: Endoscopy;  Laterality: N/A;   ESOPHAGOGASTRODUODENOSCOPY  03/04/2011   Procedure: ESOPHAGOGASTRODUODENOSCOPY (EGD);  Surgeon: Louis Meckel, MD;  Location: [facility];  Service: Endoscopy;  Laterality: N/A;  BOTOX Injection   ESOPHAGOGASTRODUODENOSCOPY (EGD) WITH PROPOFOL N/A 03/12/2019   Procedure: ESOPHAGOGASTRODUODENOSCOPY (EGD) WITH PROPOFOL;  Surgeon: Jeani Hawking, MD;  Location: [facility] ENDOSCOPY;  Service: Endoscopy;  Laterality: N/A;   exc benign breast lump     TONSILLECTOMY     UPPER GASTROINTESTINAL ENDOSCOPY     Social History   Social History Narrative   First grade Geologist, 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     Immunization History  Administered  Date(s) Administered   COVID-19, mRNA, vaccine(Comirnaty)12 years and older 11/16/2021   Influenza Inj Mdck Quad Pf 10/22/2020   Influenza Split 11/04/2015, 10/11/2017, 10/21/2018   Influenza, Quadrivalent, Recombinant, Inj, Pf 10/11/2017   Influenza,inj,Quad PF,6+ Mos 11/04/2015, 10/21/2018, 09/23/2021   Influenza-Unspecified 10/11/2017   PFIZER Comirnaty(Gray Top)Covid-19 Tri-Sucrose Vaccine 05/10/2020   PFIZER(Purple Top)SARS-COV-2 Vaccination 03/20/2019, 04/12/2019, 12/07/2019, 05/10/2020   PNEUMOCOCCAL CONJUGATE-20 10/22/2020   Pneumococcal Polysaccharide-23 01/09/2020   Tdap 08/20/2020   Zoster Recombinat (Shingrix) 10/11/2017, 12/25/2017, 01/09/2020   Zoster, Unspecified 10/11/2017, 12/25/2017     Objective: Vital Signs: BP 120/82 (BP Location: Left Arm, Patient Position: Sitting, Cuff Size: Large)   Pulse 84   Resp 12   Ht 5\' 5"  (1.651 m)   Wt 211 lb (95.7 kg)   BMI 35.11 kg/m    Physical Exam Vitals and nursing note reviewed.  Constitutional:  Appearance: She is well-developed.  HENT:     Head: Normocephalic and atraumatic.  Eyes:     Conjunctiva/sclera: Conjunctivae normal.  Cardiovascular:     Rate and Rhythm: Normal rate and regular rhythm.     Heart sounds: Normal heart sounds.  Pulmonary:     Effort: Pulmonary effort is normal.     Breath sounds: Normal breath sounds.  Abdominal:     General: Bowel sounds are normal.     Palpations: Abdomen is soft.  Musculoskeletal:     Cervical back: Normal range of motion.  Lymphadenopathy:     Cervical: No cervical adenopathy.  Skin:    General: Skin is warm and dry.     Capillary Refill: Capillary refill takes less than 2 seconds.  Neurological:     Mental Status: She is alert and oriented to person, place, and time.  Psychiatric:        Behavior: Behavior normal.      Musculoskeletal Exam: Cervical spine was in good range of motion.  She had painful range of motion of bilateral shoulders.  Elbow joints,  wrist joints, MCPs PIPs and DIPs been good range of motion with no synovitis.  Hip joints and knee joints in good range of motion.  She had tenderness over bilateral trochanteric bursa.  There was no tenderness over ankles or MTPs.  CDAI Exam: CDAI Score: -- Patient Global: --; Provider Global: -- Swollen: --; Tender: -- Joint Exam 02/03/2022   No joint exam has been documented for this visit   There is currently no information documented on the homunculus. Go to the Rheumatology activity and complete the homunculus joint exam.  Investigation: No additional findings.  Imaging: No results found.  Recent Labs: Lab Results  Component Value Date   WBC 3.7 (L) 01/06/2022   HGB 12.2 01/06/2022   PLT 300 01/06/2022   NA 141 01/06/2022   K 4.0 01/06/2022   CL 100 01/06/2022   CO2 32 01/06/2022   GLUCOSE 100 (H) 01/06/2022   BUN 12 01/06/2022   CREATININE 0.80 01/06/2022   BILITOT 0.5 01/06/2022   ALKPHOS 55 03/16/2021   AST 18 01/06/2022   ALT 14 01/06/2022   PROT 7.7 01/06/2022   ALBUMIN 4.3 03/16/2021   CALCIUM 9.8 01/06/2022   GFRAA 97 06/24/2020   QFTBGOLDPLUS NEGATIVE 05/15/2021    Speciality Comments: No specialty comments available.  Procedures:  Large Joint Inj: L glenohumeral on 02/03/2022 4:08 PM Indications: pain Details: 27 G 1.5 in needle, posterior approach  Arthrogram: No  Medications: 1.5 mL lidocaine 1 %; 40 mg triamcinolone acetonide 40 MG/ML Aspirate: 0 mL Outcome: tolerated well, no immediate complications Procedure, treatment alternatives, risks and benefits explained, specific risks discussed. Consent was given by the patient. Immediately prior to procedure a time out was called to verify the correct patient, procedure, equipment, support staff and site/side marked as required. Patient was prepped and draped in the usual sterile fashion.     Allergies: Aspirin, Hydrocodone-acetaminophen, Meperidine hcl, Morphine, Oxycodone-acetaminophen,  Ibuprofen, Codeine, Demerol [meperidine], Imdur [isosorbide nitrate], Isosorbide dinitrate, Procaine hcl, Toprol xl [metoprolol], Toradol [ketorolac tromethamine], and Tramadol   Assessment / Plan:     Visit Diagnoses: Mixed connective tissue disease ([place]) - +ANA,+Sm,+RNP,+RF, arthritis: -She has been experiencing increased pain and discomfort in her bilateral shoulders.  She states her left shoulder joint is very painful.  She would like to have cortisone injection to her left shoulder joint.  She had been taking methotrexate 0.6 mL subcutaneous weekly  along with Xeljanz 11 mg XR on daily basis.  She had no interruption in the treatment.  She denies any history of joint swelling.  No synovitis was noted on the examination today.  Will obtain autoimmune labs with her next labs in March.  Plan: Protein / creatinine ratio, urine, Anti-DNA antibody, double-stranded, C3 and C4, Sedimentation rate  High risk medication use - Xeljanz 11 mg XR by mouth daily, MTX 0.6 ml sq once weekly (reduced dose low WBC count), and folic acid 2 mg daily. -Labs obtained on January 06, 2022 WBC count but 3.7 due to immunosuppression.  Hemoglobin and platelets were stable.  CMP was normal.  Labs in September 2023 showed normal complements and sed rate was mildly elevated at 31.  Information regarding immunization was placed in the AVS.  She was advised to stop [name] and methotrexate if she develops an infection and resume after the infection resolves.  Information regarding the increased risk of MACE blackbox warning with [name] was also discussed.  Information was also placed in the AVS for her review.  Plan: CBC with Differential/Platelet, COMPLETE METABOLIC PANEL WITH GFR, QuantiFERON-TB Gold Plus  Long term systemic steroid user - she has been off prednisone for the last several months.  Chronic pain of both shoulders-she has been having increased pain and discomfort in her bilateral shoulder joints.  Left shoulder joint  was more painful.  Per patient's request after informed consent was obtained left glenohumeral joint was injected with lidocaine and Kenalog as described above.  She tolerated the procedure well.  Postprocedure instructions were given.  Her previous shoulder joint injection was in April 2022.  A handout on exercises was given and some of the exercises were demonstrated in the office.  Trochanteric bursitis of both hips-she continues to have some discomfort in her bilateral trochanteric region.  IT band stretches were advised.  Primary osteoarthritis of both knees - She is followed by Ortho care.  She gets cortisone injections.  Patient states she had a reaction to the last cortisone injection.  Her symptoms are gradually improving.  Fibromyalgia -she continues to have generalized pain and discomfort.  She is on Cymbalta 30 mg 2 capsules daily which has alleviated her symptoms.  She has also been taking baclofen 10 mg at bedtime as needed for muscle spasms and trazodone.  Need for regular exercise and stretching was emphasized.  Primary insomnia - insomnia has improved on CPAP.  She takes trazodone 50 mg half tablet to 1 tablet at bedtime for insomnia.  Chronic fatigue-related to fibromyalgia and insomnia.  Osteoporosis screening - DEXA updated on 02/12/20 T-sore -0.3  History of vitamin D deficiency  History of hypertension-blood pressure was normal at 120/82 today.  History of high cholesterol -will check lipid panel with her next labs in March.  Plan: Lipid panel  History of gastroesophageal reflux (GERD)  History of asthma  History of stomach ulcers  OSA (obstructive sleep apnea) - on CPAP, diagnosed by Dr. Craige Cotta.  Orders: Orders Placed This Encounter  Procedures   Large Joint Inj   Protein / creatinine ratio, urine   CBC with Differential/Platelet   COMPLETE METABOLIC PANEL WITH GFR   Anti-DNA antibody, double-stranded   C3 and C4   Sedimentation rate   Lipid panel    QuantiFERON-TB Gold Plus   No orders of the defined types were placed in this encounter.   Follow-Up Instructions: Return in about 5 months (around 07/05/2022) for MCTD.   Pollyann Savoy, MD  Note - This record has been created using [employer].  Chart creation errors have been sought, but may not always  have been located. Such creation errors do not reflect on  the standard of medical care.

## 2022-02-03 ENCOUNTER — Encounter: Payer: Self-pay | Admitting: Rheumatology

## 2022-02-03 ENCOUNTER — Encounter: Payer: Self-pay | Admitting: *Deleted

## 2022-02-03 ENCOUNTER — Ambulatory Visit (INDEPENDENT_AMBULATORY_CARE_PROVIDER_SITE_OTHER): Payer: BC Managed Care – PPO | Admitting: Adult Health

## 2022-02-03 ENCOUNTER — Ambulatory Visit: Payer: BC Managed Care – PPO | Attending: Rheumatology | Admitting: Rheumatology

## 2022-02-03 ENCOUNTER — Encounter: Payer: Self-pay | Admitting: Adult Health

## 2022-02-03 VITALS — BP 120/82 | HR 84 | Resp 12 | Ht 65.0 in | Wt 211.0 lb

## 2022-02-03 VITALS — BP 110/80 | HR 74 | Temp 97.9°F | Ht 65.0 in | Wt 209.0 lb

## 2022-02-03 DIAGNOSIS — R0789 Other chest pain: Secondary | ICD-10-CM

## 2022-02-03 DIAGNOSIS — M25511 Pain in right shoulder: Secondary | ICD-10-CM | POA: Diagnosis not present

## 2022-02-03 DIAGNOSIS — M25512 Pain in left shoulder: Secondary | ICD-10-CM | POA: Diagnosis not present

## 2022-02-03 DIAGNOSIS — J309 Allergic rhinitis, unspecified: Secondary | ICD-10-CM

## 2022-02-03 DIAGNOSIS — M170 Bilateral primary osteoarthritis of knee: Secondary | ICD-10-CM

## 2022-02-03 DIAGNOSIS — M351 Other overlap syndromes: Secondary | ICD-10-CM

## 2022-02-03 DIAGNOSIS — Z7952 Long term (current) use of systemic steroids: Secondary | ICD-10-CM | POA: Diagnosis not present

## 2022-02-03 DIAGNOSIS — J4520 Mild intermittent asthma, uncomplicated: Secondary | ICD-10-CM | POA: Diagnosis not present

## 2022-02-03 DIAGNOSIS — G8929 Other chronic pain: Secondary | ICD-10-CM | POA: Diagnosis not present

## 2022-02-03 DIAGNOSIS — F5101 Primary insomnia: Secondary | ICD-10-CM

## 2022-02-03 DIAGNOSIS — M7061 Trochanteric bursitis, right hip: Secondary | ICD-10-CM

## 2022-02-03 DIAGNOSIS — R5382 Chronic fatigue, unspecified: Secondary | ICD-10-CM

## 2022-02-03 DIAGNOSIS — M797 Fibromyalgia: Secondary | ICD-10-CM

## 2022-02-03 DIAGNOSIS — G4733 Obstructive sleep apnea (adult) (pediatric): Secondary | ICD-10-CM | POA: Diagnosis not present

## 2022-02-03 DIAGNOSIS — Z79899 Other long term (current) drug therapy: Secondary | ICD-10-CM | POA: Diagnosis not present

## 2022-02-03 DIAGNOSIS — Z8639 Personal history of other endocrine, nutritional and metabolic disease: Secondary | ICD-10-CM

## 2022-02-03 DIAGNOSIS — Z8709 Personal history of other diseases of the respiratory system: Secondary | ICD-10-CM

## 2022-02-03 DIAGNOSIS — Z8719 Personal history of other diseases of the digestive system: Secondary | ICD-10-CM

## 2022-02-03 DIAGNOSIS — Z8679 Personal history of other diseases of the circulatory system: Secondary | ICD-10-CM

## 2022-02-03 DIAGNOSIS — Z8711 Personal history of peptic ulcer disease: Secondary | ICD-10-CM

## 2022-02-03 DIAGNOSIS — Z13820 Encounter for screening for osteoporosis: Secondary | ICD-10-CM

## 2022-02-03 DIAGNOSIS — M7062 Trochanteric bursitis, left hip: Secondary | ICD-10-CM

## 2022-02-03 MED ORDER — TRIAMCINOLONE ACETONIDE 40 MG/ML IJ SUSP
40.0000 mg | INTRAMUSCULAR | Status: AC | PRN
  Administered 2022-02-03: 40 mg via INTRA_ARTICULAR

## 2022-02-03 MED ORDER — LIDOCAINE HCL 1 % IJ SOLN
1.5000 mL | INTRAMUSCULAR | Status: AC | PRN
  Administered 2022-02-03: 1.5 mL

## 2022-02-03 NOTE — Assessment & Plan Note (Signed)
Moderate obstructive sleep apnea.  Patient is encouraged on CPAP compliance.  Recommend changing mask to a DreamWear nasal mask.  Plan  Patient Instructions  Wear CPAP all night long at bedtime  Change to Dream wear nasal mask.  Saline nasal spray Twice daily   Saline nasal gel At bedtime .  Healthy sleep regimen Do not drive if sleepy Work on healthy weight loss Albuterol inhaler As needed   Follow-up in 3 months and As needed

## 2022-02-03 NOTE — Assessment & Plan Note (Signed)
Asthma action plan discussed.  Trigger prevention.  Albuterol as needed

## 2022-02-03 NOTE — Patient Instructions (Addendum)
Wear CPAP all night long at bedtime  Change to Dream wear nasal mask.  Saline nasal spray Twice daily   Saline nasal gel At bedtime .  Healthy sleep regimen Do not drive if sleepy Work on healthy weight loss Albuterol inhaler As needed   Follow-up in 3 months and As needed

## 2022-02-03 NOTE — Assessment & Plan Note (Signed)
May use saline nasal spray and gel as needed

## 2022-02-03 NOTE — Progress Notes (Signed)
@Patient  ID: , female    DOB: Jul 06, 1960, 62 y.o.   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  Patient is on immunosuppression with 05/20/21 and methotrexate Previously on chronic steroids weaned off in January 2023  TEST/EVENTS :  PFTs June 21, 2019 showed normal lung function with FEV1 at 108%, ratio 90, FVC 95%, no significant bronchodilator response, DLCO 82%.  06/09/2021 HST: AHI 21.9/h, SPO2 low 79%.  Moderate obstructive sleep apnea   02/03/2022 Follow up : OSA and Asthma  Patient returns for a 41-month follow-up.  Patient has underlying moderate sleep apnea.  At last visit was started on CPAP.  Patient says that she is having a hard time tolerating CPAP.  The mask is irritating the bridge of her nose.  CPAP download shows 57% compliance.  Daily average usage at 5-1/2 hours.  Patient is on auto CPAP 5 to 15 cm H2O.  Daily average pressure at 10.1 cm H2O.  AHI 2.6/hour.  Patient has lupus with chronic skin changes along her nose.  We went over different mask options.  She would like to try the DreamWear nasal mask.  She needs something that does not put pressure along the bridge of her nose .  Does feel that a few nights that she wore it for several hours that she felt so much better.  Definitely help to decrease her daytime sleepiness  Patient has mild intermittent asthma.  Says her breathing is doing good.  No increased albuterol use.  Allergies  Allergen Reactions   Aspirin Other (See Comments)    GI bleed   Hydrocodone-Acetaminophen Hives and Nausea And Vomiting   Meperidine Hcl Hives and  Nausea And Vomiting    Short term memory loss   Morphine Hives and Nausea And Vomiting   Oxycodone-Acetaminophen Hives and Nausea And Vomiting    Reaction unknown   Ibuprofen Other (See Comments)    GI bleed   Codeine Nausea Only    Other reaction(s): Other (see comments)   Demerol [Meperidine] Nausea Only   Imdur [Isosorbide Nitrate] Other (See Comments)    Reaction unknown   Isosorbide Dinitrate Other (See Comments)    Other reaction(s): Other (see comments) Other reaction(s): Other (See Comments) Reaction unknown Reaction unknown   Procaine Hcl Other (See Comments)    Ineffective   Toprol Xl [Metoprolol] Other (See Comments)    Reaction unknown   Toradol [Ketorolac Tromethamine] Itching   Tramadol Itching    Immunization History  Administered Date(s) Administered   COVID-19, mRNA, vaccine(Comirnaty)12 years and older 11/16/2021   Influenza Inj Mdck Quad Pf 10/22/2020   Influenza Split 11/04/2015, 10/11/2017, 10/21/2018   Influenza, Quadrivalent, Recombinant, Inj, Pf 10/11/2017   Influenza,inj,Quad PF,6+ Mos 11/04/2015, 10/21/2018, 09/23/2021   Influenza-Unspecified 10/11/2017   PFIZER Comirnaty(Gray Top)Covid-19 Tri-Sucrose Vaccine 05/10/2020   PFIZER(Purple Top)SARS-COV-2 Vaccination 03/20/2019, 04/12/2019, 12/07/2019, 05/10/2020   PNEUMOCOCCAL CONJUGATE-20 10/22/2020   Pneumococcal Polysaccharide-23 01/09/2020   Tdap 08/20/2020   Zoster Recombinat (Shingrix) 10/11/2017, 12/25/2017, 01/09/2020   Zoster, Unspecified 10/11/2017, 12/25/2017    Past Medical History:  Diagnosis Date   Allergy    Anemia    Asthma    Blood transfusion  without reported diagnosis    Fibromyalgia    GERD (gastroesophageal reflux disease)    Heart murmur    High cholesterol    History of stomach ulcers    Hypertension    Lupus (HCC)    MI, old    Moderate obstructive sleep apnea 07/11/2021   Osteoarthritis    Rheumatoid arthritis ([place])    Thyroid disease    Thyroid mass    Vertigo      Tobacco History: Social History   Tobacco Use  Smoking Status Never   Passive exposure: Never  Smokeless Tobacco Never   Counseling given: Not Answered   Outpatient Medications Prior to Visit  Medication Sig Dispense Refill   acetaminophen (TYLENOL) 650 MG CR tablet Take 650 mg by mouth every 8 (eight) hours as needed for pain.     albuterol (PROVENTIL) (2.5 MG/3ML) 0.083% nebulizer solution Take 3 mLs (2.5 mg total) by nebulization every 6 (six) hours as needed for wheezing or shortness of breath. 75 mL 12   Ascorbic Acid (VITAMIN C PO) Take by mouth.     B-D INS SYR ULTRAFINE 1CC/30G 30G X 1/2" 1 ML MISC      baclofen (LIORESAL) 10 MG tablet TAKE 1 TABLET BY MOUTH AT BEDTIME AS NEEDED FOR MUSCLE SPASMS 90 tablet 1   CALCIUM PO Take by mouth daily.     Cholecalciferol 25 MCG (1000 UT) capsule Take by mouth.     diclofenac sodium (VOLTAREN) 1 % GEL Apply 3 g to 3 large joints up to 3 times daily. (Patient taking differently: Apply 2 g topically daily as needed (pain).) 3 Tube 3   DULoxetine (CYMBALTA) 30 MG capsule Take 30 mg by mouth 2 (two) times daily.     Estradiol 10 MCG TABS vaginal tablet Yuvafem 10 mcg vaginal tablet     eszopiclone 3 MG TABS Take 1 tablet (3 mg total) by mouth at bedtime as needed. Take immediately before bedtime 30 tablet 1   famotidine (PEPCID) 20 MG tablet TAKE 1 TO 2 TABLETS BY MOUTH AT BEDTIME 683 tablet 1   folic acid (FOLVITE) 1 MG tablet TAKE 2 TABLETS BY MOUTH EVERY DAY 180 tablet 3   hydrochlorothiazide (HYDRODIURIL) 25 MG tablet TAKE 1 TABLET BY MOUTH EVERY DAY 90 tablet 3   KLOR-CON M20 20 MEQ tablet TAKE 1 TABLET BY MOUTH EVERY DAY 90 tablet 3   lovastatin (MEVACOR) 20 MG tablet TAKE 1 TABLET BY MOUTH EVERYDAY AT BEDTIME 90 tablet 1   methotrexate 50 MG/2ML injection Inject 0.4 mL into skin once weekly. (Patient taking differently: Inject 0.6 mL into skin once weekly.) 6 mL 0   nystatin-triamcinolone (MYCOLOG II) cream Apply 1 application  topically 2 (two) times daily. 60 g 0   Omega-3 Fatty Acids (FISH OIL) 1000 MG CAPS Take by mouth.     pantoprazole (PROTONIX) 40 MG tablet TAKE 1 TABLET BY MOUTH EVERY DAY BEFORE BREAKFAST 90 tablet 1   predniSONE (DELTASONE) 5 MG tablet Take 4 tabs po x 4 days, 3  tabs po x 4 days, 2  tabs po x 4 days, 1  tab po x 4 days 40 tablet 0   Tuberculin-Allergy Syringes (ALLERGY SYRINGE 1CC/27GX1/2") 27G X 1/2" 1 ML MISC Patient to use to inject MTX weekly 12 each 3   valACYclovir (VALTREX) 500 MG tablet TAKE 1 TABLET (500 MG TOTAL) BY MOUTH 2 (TWO) TIMES DAILY. FOR 3 DAYS WITH ACUTE EPISODES. (Patient taking differently: TAKE 1 TABLET (500  MG TOTAL) BY MOUTH 2 (TWO) TIMES DAILY.) 18 tablet 1   vitamin B-12 (CYANOCOBALAMIN) 100 MCG tablet Take 100 mcg by mouth daily.     VITAMIN D, ERGOCALCIFEROL, PO Take 1 capsule by mouth daily.     XELJANZ XR 11 MG TB24 Take 1 tablet by mouth daily.     Zinc Citrate-Phytase (ZYTAZE) 25-500 MG CAPS Take by mouth.     fluticasone furoate-vilanterol (BREO ELLIPTA) 100-25 MCG/INH AEPB Inhale 1 puff into the lungs daily. (Patient not taking: Reported on 02/03/2022) 28 each 0   No facility-administered medications prior to visit.     Review of Systems:   Constitutional:   No  weight loss, night sweats,  Fevers, chills,  +_fatigue, or  lassitude.  HEENT:   No headaches,  Difficulty swallowing,  Tooth/dental problems, or  Sore throat,                No sneezing, itching, ear ache, nasal congestion, post nasal drip,   CV:  No chest pain,  Orthopnea, PND, swelling in lower extremities, anasarca, dizziness, palpitations, syncope.   GI  No heartburn, indigestion, abdominal pain, nausea, vomiting, diarrhea, change in bowel habits, loss of appetite, bloody stools.   Resp: No shortness of breath with exertion or at rest.  No excess mucus, no productive cough,  No non-productive cough,  No coughing up of blood.  No change in color of mucus.  No wheezing.  No chest wall  deformity  Skin: Nasal skin irritation  GU: no dysuria, change in color of urine, no urgency or frequency.  No flank pain, no hematuria   MS:  No joint pain or swelling.  No decreased range of motion.  No back pain.    Physical Exam  BP 110/80 (BP Location: Left Arm, Patient Position: Sitting, Cuff Size: Large)   Pulse 74   Temp 97.9 F (36.6 C) (Oral)   Ht 5\' 5"  (1.651 m)   Wt 209 lb (94.8 kg)   SpO2 95%   BMI 34.78 kg/m   GEN: A/Ox3; pleasant , NAD, well nourished    HEENT:  [place]/AT,  NOSE-clear, THROAT-clear, no lesions, no postnasal drip or exudate noted.   NECK:  Supple w/ fair ROM; no JVD; normal carotid impulses w/o bruits; no thyromegaly or nodules palpated; no lymphadenopathy.    RESP  Clear  P & A; w/o, wheezes/ rales/ or rhonchi. no accessory muscle use, no dullness to percussion  CARD:  RRR, no m/r/g, no peripheral edema, pulses intact, no cyanosis or clubbing.  GI:   Soft & nt; nml bowel sounds; no organomegaly or masses detected.   Musco: Warm bil, no deformities or joint swelling noted.   Neuro: alert, no focal deficits noted.    Skin: Warm, patchy dry skin along the nose    Lab Results:      Imaging: No results found.  lidocaine (XYLOCAINE) 1 % (with pres) injection 5 mL     Date Action Dose Route User   01/07/2022 1044 Given 5 mL Other (Left Knee) Suzan Slick, NP      lidocaine (XYLOCAINE) 1 % (with pres) injection 5 mL     Date Action Dose Route User   01/07/2022 1044 Given 5 mL Other (Right Knee) Suzan Slick, NP      methylPREDNISolone acetate (DEPO-MEDROL) injection 40 mg     Date Action Dose Route User   01/07/2022 1044 Given 40 mg Intra-articular (Left Knee) Suzan Slick, NP  methylPREDNISolone acetate (DEPO-MEDROL) injection 40 mg     Date Action Dose Route User   01/07/2022 1044 Given 40 mg Intra-articular (Right Knee) Adonis Huguenin, NP          Latest Ref Rng & Units 06/21/2019   12:17 PM 03/30/2017     4:51 PM  PFT Results  FVC-Pre L 2.80  2.89   FVC-Predicted Pre % 98  99   FVC-Post L 2.72  2.95   FVC-Predicted Post % 95  101   Pre FEV1/FVC % % 86  87   Post FEV1/FCV % % 90  90   FEV1-Pre L 2.42  2.51   FEV1-Predicted Pre % 107  109   FEV1-Post L 2.44  2.66   DLCO uncorrected ml/min/mmHg 17.56  17.54   DLCO UNC% % 82  68   DLCO corrected ml/min/mmHg 18.34  17.88   DLCO COR %Predicted % 86  69   DLVA Predicted % 109  88   TLC L 3.64  4.08   TLC % Predicted % 69  78   RV % Predicted % 43  57     No results found for: "NITRICOXIDE"      Assessment & Plan:   Moderate obstructive sleep apnea Moderate obstructive sleep apnea.  Patient is encouraged on CPAP compliance.  Recommend changing mask to a DreamWear nasal mask.  Plan  Patient Instructions  Wear CPAP all night long at bedtime  Change to Dream wear nasal mask.  Saline nasal spray Twice daily   Saline nasal gel At bedtime .  Healthy sleep regimen Do not drive if sleepy Work on healthy weight loss Albuterol inhaler As needed   Follow-up in 3 months and As needed      Allergic rhinitis May use saline nasal spray and gel as needed  Mild intermittent asthma Asthma action plan discussed.  Trigger prevention.  Albuterol as needed     Rubye Oaks, NP 02/03/2022

## 2022-02-03 NOTE — Patient Instructions (Addendum)
Standing Labs We placed an order today for your standing lab work.   Please have your standing labs drawn in March and every 3 months  Please have your labs drawn 2 weeks prior to your appointment so that the provider can discuss your lab results at your appointment.  Please note that you may see your imaging and lab results in [facility] before we have reviewed them. We will contact you once all results are reviewed. Please allow our office up to 72 hours to thoroughly review all of the results before contacting the office for clarification of your results.  Lab hours are:   Monday through Thursday from 8:00 am -12:30 pm and 1:00 pm-5:00 pm and Friday from 8:00 am-12:00 pm.  Please be advised, all patients with office appointments requiring lab work will take precedent over walk-in lab work.   Labs are drawn by [facility]. Please bring your co-pay at the time of your lab draw.  You may receive a bill from [facility] for your lab work.  Please note if you are on Hydroxychloroquine and and an order has been placed for a Hydroxychloroquine level, you will need to have it drawn 4 hours or more after your last dose.  If you wish to have your labs drawn at another location, please call the office 24 hours in advance so we can fax the orders.  The office is located at [address], [place], [place], [place] [address] No appointment is necessary.    If you have any questions regarding directions or hours of operation,  please call [phone].   As a reminder, please drink plenty of water prior to coming for your lab work. Thanks!   Vaccines You are taking a medication(s) that can suppress your immune system.  The following immunizations are recommended: Flu annually Covid-19  RSV Td/Tdap (tetanus, diphtheria, pertussis) every 10 years Pneumonia (Prevnar 15 then Pneumovax 23 at least 1 year apart.  Alternatively, can take Prevnar 20 without needing additional dose) Shingrix: 2 doses from 4  weeks to 6 months apart  Please check with your PCP to make sure you are up to date.  If you have signs or symptoms of an infection or start antibiotics: First, call your PCP for workup of your infection. Hold your medication through the infection, until you complete your antibiotics, and until symptoms resolve if you take the following: Injectable medication (Actemra, Benlysta, Cimzia, Cosentyx, Enbrel, Humira, Kevzara, Orencia, Remicade, Simponi, Stelara, Taltz, Tremfya) Methotrexate Leflunomide (Arava) Mycophenolate (Cellcept) Roma Kayser, or Rinvoq    Because you are taking Morrie Sheldon, Rinvoq, or Olumiant, it is very important to know that this class of medications has a FDA BLACK BOX WARNING for major adverse cardiovascular events (MACE), thrombosis, mortality (including sudden cardiovascular death), serious infections, and lymphomas. MACE is defined as cardiovascular death, myocardial infarction, and stroke. Thrombosis includes deep venous thrombosis (DVT), pulmonary embolism (PE), and arterial thrombosis. If you are a current or former smoker, you are at higher risk for MACE.   Shoulder Exercises Ask your health care provider which exercises are safe for you. Do exercises exactly as told by your health care provider and adjust them as directed. It is normal to feel mild stretching, pulling, tightness, or discomfort as you do these exercises. Stop right away if you feel sudden pain or your pain gets worse. Do not begin these exercises until told by your health care provider. Stretching exercises External rotation and abduction This exercise is sometimes called corner stretch. The exercise  rotates your arm outward (external rotation) and moves your arm out from your body (abduction). Stand in a doorway with one of your feet slightly in front of the other. This is called a staggered stance. If you cannot reach your forearms to the door frame, stand facing a corner of a room. Choose one of  the following positions as told by your health care provider: Place your hands and forearms on the door frame above your head. Place your hands and forearms on the door frame at the height of your head. Place your hands on the door frame at the height of your elbows. Slowly move your weight onto your front foot until you feel a stretch across your chest and in the front of your shoulders. Keep your head and chest upright and keep your abdominal muscles tight. Hold for __________ seconds. To release the stretch, shift your weight to your back foot. Repeat __________ times. Complete this exercise __________ times a day. Extension, standing  Stand and hold a broomstick, a cane, or a similar object behind your back. Your hands should be a little wider than shoulder-width apart. Your palms should face away from your back. Keeping your elbows straight and your shoulder muscles relaxed, move the stick away from your body until you feel a stretch in your shoulders (extension). Avoid shrugging your shoulders while you move the stick. Keep your shoulder blades tucked down toward the middle of your back. Hold for __________ seconds. Slowly return to the starting position. Repeat __________ times. Complete this exercise __________ times a day. Range-of-motion exercises Pendulum  Stand near a wall or a surface that you can hold onto for balance. Bend at the waist and let your left / right arm hang straight down. Use your other arm to support you. Keep your back straight and do not lock your knees. Relax your left / right arm and shoulder muscles, and move your hips and your trunk so your left / right arm swings freely. Your arm should swing because of the motion of your body, not because you are using your arm or shoulder muscles. Keep moving your hips and trunk so your arm swings in the following directions, as told by your health care provider: Side to side. Forward and backward. In clockwise and  counterclockwise circles. Continue each motion for __________ seconds, or for as long as told by your health care provider. Slowly return to the starting position. Repeat __________ times. Complete this exercise __________ times a day. Shoulder flexion, standing  Stand and hold a broomstick, a cane, or a similar object. Place your hands a little more than shoulder-width apart on the object. Your left / right hand should be palm-up, and your other hand should be palm-down. Keep your elbow straight and your shoulder muscles relaxed. Push the stick up with your healthy arm to raise your left / right arm in front of your body, and then over your head until you feel a stretch in your shoulder (flexion). Avoid shrugging your shoulder while you raise your arm. Keep your shoulder blade tucked down toward the middle of your back. Hold for __________ seconds. Slowly return to the starting position. Repeat __________ times. Complete this exercise __________ times a day. Shoulder abduction, standing  Stand and hold a broomstick, a cane, or a similar object. Place your hands a little more than shoulder-width apart on the object. Your left / right hand should be palm-up, and your other hand should be palm-down. Keep your elbow straight  and your shoulder muscles relaxed. Push the object across your body toward your left / right side. Raise your left / right arm to the side of your body (abduction) until you feel a stretch in your shoulder. Do not raise your arm above shoulder height unless your health care provider tells you to do that. If directed, raise your arm over your head. Avoid shrugging your shoulder while you raise your arm. Keep your shoulder blade tucked down toward the middle of your back. Hold for __________ seconds. Slowly return to the starting position. Repeat __________ times. Complete this exercise __________ times a day. Internal rotation  Place your left / right hand behind your back,  palm-up. Use your other hand to dangle an exercise band, a broomstick, or a similar object over your shoulder. Grasp the band with your left / right hand so you are holding on to both ends. Gently pull up on the band until you feel a stretch in the front of your left / right shoulder. The movement of your arm toward the center of your body is called internal rotation. Avoid shrugging your shoulder while you raise your arm. Keep your shoulder blade tucked down toward the middle of your back. Hold for __________ seconds. Release the stretch by letting go of the band and lowering your hands. Repeat __________ times. Complete this exercise __________ times a day. Strengthening exercises External rotation  Sit in a stable chair without armrests. Secure an exercise band to a stable object at elbow height on your left / right side. Place a soft object, such as a folded towel or a small pillow, between your left / right upper arm and your body to move your elbow about 4 inches (10 cm) away from your side. Hold the end of the exercise band so it is tight and there is no slack. Keeping your elbow pressed against the soft object, slowly move your forearm out, away from your abdomen (external rotation). Keep your body steady so only your forearm moves. Hold for __________ seconds. Slowly return to the starting position. Repeat __________ times. Complete this exercise __________ times a day. Shoulder abduction  Sit in a stable chair without armrests, or stand up. Hold a __________ lb / kg weight in your left / right hand, or hold an exercise band with both hands. Start with your arms straight down and your left / right palm facing in, toward your body. Slowly lift your left / right hand out to your side (abduction). Do not lift your hand above shoulder height unless your health care provider tells you that this is safe. Keep your arms straight. Avoid shrugging your shoulder while you do this movement.  Keep your shoulder blade tucked down toward the middle of your back. Hold for __________ seconds. Slowly lower your arm, and return to the starting position. Repeat __________ times. Complete this exercise __________ times a day. Shoulder extension  Sit in a stable chair without armrests, or stand up. Secure an exercise band to a stable object in front of you so it is at shoulder height. Hold one end of the exercise band in each hand. Straighten your elbows and lift your hands up to shoulder height. Squeeze your shoulder blades together as you pull your hands down to the sides of your thighs (extension). Stop when your hands are straight down by your sides. Do not let your hands go behind your body. Hold for __________ seconds. Slowly return to the starting position. Repeat __________ times.  Complete this exercise __________ times a day. Shoulder row  Sit in a stable chair without armrests, or stand up. Secure an exercise band to a stable object in front of you so it is at chest height. Hold one end of the exercise band in each hand. Position your palms so that your thumbs are facing the ceiling (neutral position). Bend each of your elbows to a 90-degree angle (right angle) and keep your upper arms at your sides. Step back or move the chair back until the band is tight and there is no slack. Slowly pull your elbows back behind you. Hold for __________ seconds. Slowly return to the starting position. Repeat __________ times. Complete this exercise __________ times a day. Shoulder press-ups  Sit in a stable chair that has armrests. Sit upright, with your feet flat on the floor. Put your hands on the armrests so your elbows are bent and your fingers are pointing forward. Your hands should be about even with the sides of your body. Push down on the armrests and use your arms to lift yourself off the chair. Straighten your elbows and lift yourself up as much as you comfortably can. Move your  shoulder blades down, and avoid letting your shoulders move up toward your ears. Keep your feet on the ground. As you get stronger, your feet should support less of your body weight as you lift yourself up. Hold for __________ seconds. Slowly lower yourself back into the chair. Repeat __________ times. Complete this exercise __________ times a day. Wall push-ups  Stand so you are facing a stable wall. Your feet should be about one arm-length away from the wall. Lean forward and place your palms on the wall at shoulder height. Keep your feet flat on the floor as you bend your elbows and lean forward toward the wall. Hold for __________ seconds. Straighten your elbows to push yourself back to the starting position. Repeat __________ times. Complete this exercise __________ times a day. This information is not intended to replace advice given to you by your health care provider. Make sure you discuss any questions you have with your health care provider. Document Revised: 02/25/2021 Document Reviewed: 02/25/2021 Elsevier Patient Education  2023 [employer].

## 2022-02-19 ENCOUNTER — Telehealth (INDEPENDENT_AMBULATORY_CARE_PROVIDER_SITE_OTHER): Payer: BC Managed Care – PPO | Admitting: Family Medicine

## 2022-02-19 ENCOUNTER — Encounter: Payer: Self-pay | Admitting: Family Medicine

## 2022-02-19 VITALS — Temp 101.3°F | Ht 65.0 in | Wt 211.0 lb

## 2022-02-19 DIAGNOSIS — J101 Influenza due to other identified influenza virus with other respiratory manifestations: Secondary | ICD-10-CM

## 2022-02-19 DIAGNOSIS — R059 Cough, unspecified: Secondary | ICD-10-CM

## 2022-02-19 DIAGNOSIS — R112 Nausea with vomiting, unspecified: Secondary | ICD-10-CM | POA: Diagnosis not present

## 2022-02-19 LAB — POCT RAPID STREP A (OFFICE): Rapid Strep A Screen: NEGATIVE

## 2022-02-19 LAB — POC COVID19 BINAXNOW: SARS Coronavirus 2 Ag: NEGATIVE

## 2022-02-19 LAB — POCT INFLUENZA A/B
Influenza A, POC: POSITIVE — AB
Influenza B, POC: NEGATIVE

## 2022-02-19 MED ORDER — OSELTAMIVIR PHOSPHATE 75 MG PO CAPS: 75.0000 mg | ORAL_CAPSULE | Freq: Two times a day (BID) | ORAL | 0 refills | Status: AC

## 2022-02-19 MED ORDER — ONDANSETRON HCL 4 MG PO TABS: 4.0000 mg | ORAL_TABLET | Freq: Three times a day (TID) | ORAL | 0 refills | Status: DC | PRN

## 2022-02-19 NOTE — Progress Notes (Signed)
Virtual Visit via Video Note  I connected with Kathreen Hanna on 02/19/22 at  2:30 PM EST by a video enabled telemedicine application 2/2 [id number] pandemic and verified that I am speaking with the correct person using two identifiers.  Location patient: home Location provider:work or home office Persons participating in the virtual visit: patient, provider  I discussed the limitations of evaluation and management by telemedicine and the availability of in person appointments. The patient expressed understanding and agreed to proceed. Chief Complaint  Patient presents with   Cough    Pt reports sx of cough- productive green/yellow, bodyache, chills, sore throat, fatigue and headache. Sx started Tuesday night. No medication is taken     HPI: Patient is a 62 year old female with pmh sig for HTN, GERD, OSA, asthma, fibromyalgia, HSV, lupus followed by Dr. [name] and seen for acute concern. Pt endorses body aches, HA, cough, nasal congestion, sore throat that started 2 days ago.  Emesis started Wed.  Only able to keep water down.  Pt notes h/o lupus and fibromyalgia which is causing the pain to be worse.  ROS: See pertinent positives and negatives per HPI.  Past Medical History:  Diagnosis Date   Allergy    Anemia    Asthma    Blood transfusion without reported diagnosis    Fibromyalgia    GERD (gastroesophageal reflux disease)    Heart murmur    High cholesterol    History of stomach ulcers    Hypertension    Lupus (HCC)    MI, old    Moderate obstructive sleep apnea 07/11/2021   Osteoarthritis    Rheumatoid arthritis ([place])    Thyroid disease    Thyroid mass    Vertigo     Past Surgical History:  Procedure Laterality Date   ABDOMINAL HYSTERECTOMY     done for menorrhagia, ovaries remain   CERVICAL DISCECTOMY     plates and screws in neck   CESAREAN SECTION     COLONOSCOPY     ESOPHAGEAL MANOMETRY N/A 02/12/2014   Procedure: ESOPHAGEAL MANOMETRY (EM);  Surgeon: Inda Castle, MD;  Location: [facility];  Service: Endoscopy;  Laterality: N/A;   ESOPHAGOGASTRODUODENOSCOPY  03/04/2011   Procedure: ESOPHAGOGASTRODUODENOSCOPY (EGD);  Surgeon: Inda Castle, MD;  Location: [facility];  Service: Endoscopy;  Laterality: N/A;  BOTOX Injection   ESOPHAGOGASTRODUODENOSCOPY (EGD) WITH PROPOFOL N/A 03/12/2019   Procedure: ESOPHAGOGASTRODUODENOSCOPY (EGD) WITH PROPOFOL;  Surgeon: Carol Ada, MD;  Location: [place];  Service: Endoscopy;  Laterality: N/A;   exc benign breast lump     TONSILLECTOMY     UPPER GASTROINTESTINAL ENDOSCOPY      Family History  Problem Relation Age of Onset   Alcohol abuse Father    Hypertension Father    Heart disease Father    Other Mother        tobacco use disorder, refuses to see a doctor   Osteoporosis Sister    Colon cancer Maternal Uncle    Diabetes Maternal Grandmother    Healthy Son    Healthy Daughter    Multiple sclerosis Daughter    Colon polyps Neg Hx    Esophageal cancer Neg Hx    Kidney disease Neg Hx    Stomach cancer Neg Hx    Rectal cancer Neg Hx       Current Outpatient Medications:    acetaminophen (TYLENOL) 650 MG CR tablet, Take 650 mg by mouth every 8 (eight) hours as needed for pain., Disp: ,  Rfl:    albuterol (PROVENTIL) (2.5 MG/3ML) 0.083% nebulizer solution, Take 3 mLs (2.5 mg total) by nebulization every 6 (six) hours as needed for wheezing or shortness of breath., Disp: 75 mL, Rfl: 12   Ascorbic Acid (VITAMIN C PO), Take by mouth., Disp: , Rfl:    B-D INS SYR ULTRAFINE 1CC/30G 30G X 1/2" 1 ML MISC, , Disp: , Rfl:    baclofen (LIORESAL) 10 MG tablet, TAKE 1 TABLET BY MOUTH AT BEDTIME AS NEEDED FOR MUSCLE SPASMS, Disp: 90 tablet, Rfl: 1   CALCIUM PO, Take by mouth daily., Disp: , Rfl:    Cholecalciferol 25 MCG (1000 UT) capsule, Take by mouth., Disp: , Rfl:    diclofenac sodium (VOLTAREN) 1 % GEL, Apply 3 g to 3 large joints up to 3 times daily. (Patient taking differently: Apply 2 g  topically daily as needed (pain).), Disp: 3 Tube, Rfl: 3   DULoxetine (CYMBALTA) 30 MG capsule, Take 30 mg by mouth 2 (two) times daily., Disp: , Rfl:    Estradiol 10 MCG TABS vaginal tablet, Yuvafem 10 mcg vaginal tablet, Disp: , Rfl:    eszopiclone 3 MG TABS, Take 1 tablet (3 mg total) by mouth at bedtime as needed. Take immediately before bedtime, Disp: 30 tablet, Rfl: 1   famotidine (PEPCID) 20 MG tablet, TAKE 1 TO 2 TABLETS BY MOUTH AT BEDTIME, Disp: 180 tablet, Rfl: 1   fluticasone furoate-vilanterol (BREO ELLIPTA) 100-25 MCG/INH AEPB, Inhale 1 puff into the lungs daily., Disp: 28 each, Rfl: 0   folic acid (FOLVITE) 1 MG tablet, TAKE 2 TABLETS BY MOUTH EVERY DAY, Disp: 180 tablet, Rfl: 3   hydrochlorothiazide (HYDRODIURIL) 25 MG tablet, TAKE 1 TABLET BY MOUTH EVERY DAY, Disp: 90 tablet, Rfl: 3   KLOR-CON M20 20 MEQ tablet, TAKE 1 TABLET BY MOUTH EVERY DAY, Disp: 90 tablet, Rfl: 3   lovastatin (MEVACOR) 20 MG tablet, TAKE 1 TABLET BY MOUTH EVERYDAY AT BEDTIME, Disp: 90 tablet, Rfl: 1   methotrexate 50 MG/2ML injection, Inject 0.4 mL into skin once weekly. (Patient taking differently: Inject 0.6 mL into skin once weekly.), Disp: 6 mL, Rfl: 0   nystatin-triamcinolone (MYCOLOG II) cream, Apply 1 application topically 2 (two) times daily., Disp: 60 g, Rfl: 0   Omega-3 Fatty Acids (FISH OIL) 1000 MG CAPS, Take by mouth., Disp: , Rfl:    pantoprazole (PROTONIX) 40 MG tablet, TAKE 1 TABLET BY MOUTH EVERY DAY BEFORE BREAKFAST, Disp: 90 tablet, Rfl: 1   Tuberculin-Allergy Syringes (ALLERGY SYRINGE 1CC/27GX1/2") 27G X 1/2" 1 ML MISC, Patient to use to inject MTX weekly, Disp: 12 each, Rfl: 3   valACYclovir (VALTREX) 500 MG tablet, TAKE 1 TABLET (500 MG TOTAL) BY MOUTH 2 (TWO) TIMES DAILY. FOR 3 DAYS WITH ACUTE EPISODES. (Patient taking differently: TAKE 1 TABLET (500 MG TOTAL) BY MOUTH 2 (TWO) TIMES DAILY.), Disp: 18 tablet, Rfl: 1   vitamin B-12 (CYANOCOBALAMIN) 100 MCG tablet, Take 100 mcg by mouth  daily., Disp: , Rfl:    VITAMIN D, ERGOCALCIFEROL, PO, Take 1 capsule by mouth daily., Disp: , Rfl:    XELJANZ XR 11 MG TB24, Take 1 tablet by mouth daily., Disp: , Rfl:    Zinc Citrate-Phytase (ZYTAZE) 25-500 MG CAPS, Take by mouth., Disp: , Rfl:   EXAM:  VITALS per patient if applicable: RR between 95-09 bpm  GENERAL: alert, oriented, appears sick, uncomfortable, nontoxic, and in no acute distress  HEENT: atraumatic, conjunctiva clear, no obvious abnormalities on inspection of external nose  and ears  NECK: normal movements of the head and neck  LUNGS: on inspection no signs of respiratory distress, breathing rate appears normal, no obvious gross SOB, gasping or wheezing  CV: no obvious cyanosis  MS: moves all visible extremities without noticeable abnormality  PSYCH/NEURO: pleasant and cooperative, no obvious depression or anxiety, speech and thought processing grossly intact  ASSESSMENT AND PLAN:  Discussed the following assessment and plan:  Influenza A - Plan: oseltamivir (TAMIFLU) 75 MG capsule  Cough, unspecified type - Plan: POC COVID-19, POC Influenza A/B, POC Rapid Strep A  Nausea and vomiting, unspecified vomiting type - Plan: ondansetron (ZOFRAN) 4 MG tablet  Influenza A testing positive in clinic.  Discussed r/b/a s that of starting antiviral medication.  Patient wishes to start Tamiflu.  Continue supportive care. Given precautions.  F/u prn for continued or worsened symptoms  I discussed the assessment and treatment plan with the patient. The patient was provided an opportunity to ask questions and all were answered. The patient agreed with the plan and demonstrated an understanding of the instructions.   The patient was advised to call back or seek an in-person evaluation if the symptoms worsen or if the condition fails to improve as anticipated.   Billie Ruddy, MD

## 2022-02-20 ENCOUNTER — Telehealth: Payer: Self-pay | Admitting: Family Medicine

## 2022-02-20 NOTE — Telephone Encounter (Signed)
Saw Banks on 02/19/22. Patient requesting an excuse from work, was diagnosed with the flu. Also has lupus and feels she needs a little more time to recouperate, possibly returning to work on 03/02/2022.

## 2022-02-23 NOTE — Telephone Encounter (Signed)
Pt is asking that the work note be uploaded to her MyChart, as soon as possible.

## 2022-02-23 NOTE — Telephone Encounter (Signed)
Unable to complete request to take patient out of work x 2 weeks for the flu.

## 2022-02-24 ENCOUNTER — Encounter: Payer: Self-pay | Admitting: Family Medicine

## 2022-02-24 NOTE — Telephone Encounter (Signed)
Spoke to pt. Inform her of message below. Pt states she only needs excuse note from  last Friday on 2/2 to this Friday 2/9. Please advise.

## 2022-02-25 ENCOUNTER — Encounter: Payer: Self-pay | Admitting: Family Medicine

## 2022-02-26 NOTE — Telephone Encounter (Signed)
Fine

## 2022-03-04 ENCOUNTER — Telehealth: Payer: Self-pay

## 2022-03-04 NOTE — Telephone Encounter (Signed)
Spoke to patient. Inform her letter is ready at the [place]. Verbalized understanding. She states she will pick it up today since she is heading to [facility]

## 2022-03-04 NOTE — Telephone Encounter (Signed)
Spoke to pt. Letter was printed and at the [place], ready to pick up. Pt is aware.

## 2022-03-19 ENCOUNTER — Emergency Department (HOSPITAL_BASED_OUTPATIENT_CLINIC_OR_DEPARTMENT_OTHER): Payer: BC Managed Care – PPO

## 2022-03-19 ENCOUNTER — Emergency Department (HOSPITAL_BASED_OUTPATIENT_CLINIC_OR_DEPARTMENT_OTHER)
Admission: EM | Admit: 2022-03-19 | Discharge: 2022-03-19 | Disposition: A | Discharge status: Home / Self Care | Payer: BC Managed Care – PPO | Attending: Emergency Medicine | Admitting: Emergency Medicine

## 2022-03-19 ENCOUNTER — Other Ambulatory Visit (HOSPITAL_BASED_OUTPATIENT_CLINIC_OR_DEPARTMENT_OTHER): Payer: Self-pay

## 2022-03-19 ENCOUNTER — Encounter (HOSPITAL_BASED_OUTPATIENT_CLINIC_OR_DEPARTMENT_OTHER): Payer: Self-pay | Admitting: Emergency Medicine

## 2022-03-19 ENCOUNTER — Other Ambulatory Visit: Payer: Self-pay

## 2022-03-19 DIAGNOSIS — R5383 Other fatigue: Secondary | ICD-10-CM

## 2022-03-19 DIAGNOSIS — U071 COVID-19: Secondary | ICD-10-CM | POA: Insufficient documentation

## 2022-03-19 DIAGNOSIS — R1013 Epigastric pain: Secondary | ICD-10-CM | POA: Insufficient documentation

## 2022-03-19 DIAGNOSIS — J189 Pneumonia, unspecified organism: Secondary | ICD-10-CM

## 2022-03-19 DIAGNOSIS — J1282 Pneumonia due to coronavirus disease 2019: Secondary | ICD-10-CM | POA: Diagnosis not present

## 2022-03-19 LAB — LACTIC ACID, PLASMA: Lactic Acid, Venous: 1.6 mmol/L (ref 0.5–1.9)

## 2022-03-19 LAB — CBC WITH DIFFERENTIAL/PLATELET
Abs Immature Granulocytes: 0.01 10*3/uL (ref 0.00–0.07)
Basophils Absolute: 0 10*3/uL (ref 0.0–0.1)
Basophils Relative: 0 %
Eosinophils Absolute: 0.1 10*3/uL (ref 0.0–0.5)
Eosinophils Relative: 2 %
HCT: 45.2 % (ref 36.0–46.0)
Hemoglobin: 14.2 g/dL (ref 12.0–15.0)
Immature Granulocytes: 0 %
Lymphocytes Relative: 20 %
Lymphs Abs: 0.7 10*3/uL (ref 0.7–4.0)
MCH: 29.8 pg (ref 26.0–34.0)
MCHC: 31.4 g/dL (ref 30.0–36.0)
MCV: 94.8 fL (ref 80.0–100.0)
Monocytes Absolute: 0.2 10*3/uL (ref 0.1–1.0)
Monocytes Relative: 6 %
Neutro Abs: 2.6 10*3/uL (ref 1.7–7.7)
Neutrophils Relative %: 72 %
Platelets: 277 10*3/uL (ref 150–400)
RBC: 4.77 MIL/uL (ref 3.87–5.11)
RDW: 14.8 % (ref 11.5–15.5)
WBC: 3.7 10*3/uL — ABNORMAL LOW (ref 4.0–10.5)
nRBC: 0 % (ref 0.0–0.2)

## 2022-03-19 LAB — COMPREHENSIVE METABOLIC PANEL
ALT: 17 U/L (ref 0–44)
AST: 22 U/L (ref 15–41)
Albumin: 4.1 g/dL (ref 3.5–5.0)
Alkaline Phosphatase: 69 U/L (ref 38–126)
Anion gap: 8 (ref 5–15)
BUN: 12 mg/dL (ref 6–20)
CO2: 29 mmol/L (ref 22–32)
Calcium: 8.8 mg/dL — ABNORMAL LOW (ref 8.9–10.3)
Chloride: 97 mmol/L — ABNORMAL LOW (ref 98–111)
Creatinine, Ser: 0.87 mg/dL (ref 0.44–1.00)
GFR, Estimated: >60 mL/min (ref >60)
Glucose, Bld: 97 mg/dL (ref 70–99)
Potassium: 3.3 mmol/L — ABNORMAL LOW (ref 3.5–5.1)
Sodium: 134 mmol/L — ABNORMAL LOW (ref 135–145)
Total Bilirubin: 1.4 mg/dL — ABNORMAL HIGH (ref 0.3–1.2)
Total Protein: 8.9 g/dL — ABNORMAL HIGH (ref 6.5–8.1)

## 2022-03-19 LAB — URINALYSIS, ROUTINE W REFLEX MICROSCOPIC
Bilirubin Urine: NEGATIVE
Glucose, UA: NEGATIVE mg/dL
Hgb urine dipstick: NEGATIVE
Ketones, ur: 15 mg/dL — AB
Nitrite: NEGATIVE
Protein, ur: NEGATIVE mg/dL
Specific Gravity, Urine: 1.025 (ref 1.005–1.030)
pH: 5.5 (ref 5.0–8.0)

## 2022-03-19 LAB — URINALYSIS, MICROSCOPIC (REFLEX): RBC / HPF: NONE SEEN RBC/hpf (ref 0–5)

## 2022-03-19 LAB — RESP PANEL BY RT-PCR (RSV, FLU A&B, COVID)  RVPGX2
Influenza A by PCR: NEGATIVE
Influenza B by PCR: NEGATIVE
Resp Syncytial Virus by PCR: NEGATIVE
SARS Coronavirus 2 by RT PCR: POSITIVE — AB

## 2022-03-19 LAB — LIPASE, BLOOD: Lipase: 28 U/L (ref 11–51)

## 2022-03-19 LAB — TROPONIN I (HIGH SENSITIVITY): Troponin I (High Sensitivity): 3 ng/L (ref <18)

## 2022-03-19 LAB — MAGNESIUM: Magnesium: 2 mg/dL (ref 1.7–2.4)

## 2022-03-19 MED ORDER — DOXYCYCLINE HYCLATE 100 MG PO CAPS
100.0000 mg | ORAL_CAPSULE | Freq: Two times a day (BID) | ORAL | 0 refills | Status: DC
  Filled 2022-03-19: qty 14, 7d supply, fill #0

## 2022-03-19 MED ORDER — CEFPODOXIME PROXETIL 200 MG PO TABS
200.0000 mg | ORAL_TABLET | Freq: Two times a day (BID) | ORAL | 0 refills | Status: AC
  Filled 2022-03-19: qty 14, 7d supply, fill #0

## 2022-03-19 MED ORDER — SODIUM CHLORIDE 0.9 % IV SOLN
1.0000 g | Freq: Once | INTRAVENOUS | Status: AC
  Administered 2022-03-19: 1 g via INTRAVENOUS
  Filled 2022-03-19: qty 10

## 2022-03-19 MED ORDER — SODIUM CHLORIDE 0.9 % IV BOLUS
1000.0000 mL | Freq: Once | INTRAVENOUS | Status: AC
  Administered 2022-03-19: 1000 mL via INTRAVENOUS

## 2022-03-19 MED ORDER — ACETAMINOPHEN 325 MG PO TABS
650.0000 mg | ORAL_TABLET | Freq: Once | ORAL | Status: AC
  Administered 2022-03-19: 650 mg via ORAL
  Filled 2022-03-19: qty 2

## 2022-03-19 NOTE — ED Notes (Signed)
Up to restroom to provide urine spec, gait very steady. Able to ambulate without assistance

## 2022-03-19 NOTE — ED Triage Notes (Signed)
Flu 1 month ago , lethargy x 2 weeks , body aches , no appetite x 2 days

## 2022-03-19 NOTE — ED Provider Notes (Signed)
[facility] Provider Note   CSN: [id number] Arrival date & time: 03/19/22  [id number]     History  Chief Complaint  Patient presents with   Fatigue    Patricia Reed is a 61 y.o. female, history of lupus, who presents to the ED secondary to fatigue, and weakness for the last couple weeks, and now new onset nausea, reduced appetite for the last couple days, and epigastric discomfort.  She denies any vomiting, chest pain, shortness of breath, or diarrhea.  Reports normal bowel movements, however has not eaten in 2 days.  Just feels fatigued, and weak.  No history of blood clots, is on any blood thinners.   States a lot of times when she has epigastric pain is from her ulcers, and that she has felt like this before.  Last time she states she felt this bad, her potassium was very low.     Home Medications Prior to Admission medications   Medication Sig Start Date End Date Taking? Authorizing Provider  cefpodoxime (VANTIN) 200 MG tablet Take 1 tablet (200 mg total) by mouth 2 (two) times daily for 7 days. 03/19/22 03/26/22 Yes Jalia Zuniga L, PA  doxycycline (VIBRAMYCIN) 100 MG capsule Take 1 capsule (100 mg total) by mouth 2 (two) times daily. 03/19/22  Yes Letanya Froh L, PA  acetaminophen (TYLENOL) 650 MG CR tablet Take 650 mg by mouth every 8 (eight) hours as needed for pain.    [provider]  albuterol (PROVENTIL) (2.5 MG/3ML) 0.083% nebulizer solution Take 3 mLs (2.5 mg total) by nebulization every 6 (six) hours as needed for wheezing or shortness of breath. 03/30/19   Martyn Ehrich, NP  Ascorbic Acid (VITAMIN C PO) Take by mouth.    [provider]  B-D INS SYR ULTRAFINE 1CC/30G 30G X 1/2" 1 ML MISC  03/05/18   [provider]  baclofen (LIORESAL) 10 MG tablet TAKE 1 TABLET BY MOUTH AT BEDTIME AS NEEDED FOR MUSCLE SPASMS 08/28/19   [name], [name], MD  CALCIUM PO Take by mouth daily.    [provider]   Cholecalciferol 25 MCG (1000 UT) capsule Take by mouth.    [provider]  diclofenac sodium (VOLTAREN) 1 % GEL Apply 3 g to 3 large joints up to 3 times daily. Patient taking differently: Apply 2 g topically daily as needed (pain). 05/11/17   Ofilia Neas, PA-C  DULoxetine (CYMBALTA) 30 MG capsule Take 30 mg by mouth 2 (two) times daily.    [provider]  Estradiol 10 MCG TABS vaginal tablet Yuvafem 10 mcg vaginal tablet    [provider]  eszopiclone 3 MG TABS Take 1 tablet (3 mg total) by mouth at bedtime as needed. Take immediately before bedtime 12/02/21   [name], [name], MD  famotidine (PEPCID) 20 MG tablet TAKE 1 TO 2 TABLETS BY MOUTH AT BEDTIME 10/20/19   [name], [name], MD  fluticasone furoate-vilanterol (BREO ELLIPTA) 100-25 MCG/INH AEPB Inhale 1 puff into the lungs daily. 03/30/19   Martyn Ehrich, NP  folic acid (FOLVITE) 1 MG tablet TAKE 2 TABLETS BY MOUTH EVERY DAY 11/24/21   Ofilia Neas, PA-C  hydrochlorothiazide (HYDRODIURIL) 25 MG tablet TAKE 1 TABLET BY MOUTH EVERY DAY 09/17/21   [name], [name], MD  KLOR-CON M20 20 MEQ tablet TAKE 1 TABLET BY MOUTH EVERY DAY 09/17/21   [name], [name], MD  lovastatin (MEVACOR) 20 MG tablet TAKE 1  TABLET BY MOUTH EVERYDAY AT BEDTIME 11/24/21   [name], [name], MD  methotrexate 50 MG/2ML injection Inject 0.4 mL into skin once weekly. Patient taking differently: Inject 0.6 mL into skin once weekly. 05/07/21   Ofilia Neas, PA-C  nystatin-triamcinolone (MYCOLOG II) cream Apply 1 application topically 2 (two) times daily. 01/24/20   Ofilia Neas, PA-C  Omega-3 Fatty Acids (FISH OIL) 1000 MG CAPS Take by mouth.    [provider]  ondansetron (ZOFRAN) 4 MG tablet Take 1 tablet (4 mg total) by mouth every 8 (eight) hours as needed for nausea or vomiting. 02/19/22   Billie Ruddy, MD  pantoprazole (PROTONIX) 40 MG tablet TAKE 1 TABLET BY MOUTH EVERY DAY BEFORE BREAKFAST 11/24/21   [name], [name], MD   Tuberculin-Allergy Syringes (ALLERGY SYRINGE 1CC/27GX1/2") 27G X 1/2" 1 ML MISC Patient to use to inject MTX weekly 08/21/16   Bo Merino, MD  valACYclovir (VALTREX) 500 MG tablet TAKE 1 TABLET (500 MG TOTAL) BY MOUTH 2 (TWO) TIMES DAILY. FOR 3 DAYS WITH ACUTE EPISODES. Patient taking differently: TAKE 1 TABLET (500 MG TOTAL) BY MOUTH 2 (TWO) TIMES DAILY. 04/17/19   [name], [name], MD  vitamin B-12 (CYANOCOBALAMIN) 100 MCG tablet Take 100 mcg by mouth daily.    [provider]  VITAMIN D, ERGOCALCIFEROL, PO Take 1 capsule by mouth daily.    [provider]  XELJANZ XR 11 MG TB24 Take 1 tablet by mouth daily. 03/02/18   [provider]  Zinc Citrate-Phytase (ZYTAZE) 25-500 MG CAPS Take by mouth.    [provider]      Allergies    Aspirin, Hydrocodone-acetaminophen, Meperidine hcl, Morphine, Oxycodone-acetaminophen, Ibuprofen, Codeine, Demerol [meperidine], Imdur [isosorbide nitrate], Isosorbide dinitrate, Procaine hcl, Toprol xl [metoprolol], Toradol [ketorolac tromethamine], and Tramadol    Review of Systems   Review of Systems  Cardiovascular:  Negative for chest pain.  Gastrointestinal:  Positive for abdominal pain and nausea. Negative for diarrhea and vomiting.    Physical Exam Updated Vital Signs BP 114/73 (BP Location: Right Arm)   Pulse 80   Temp 98.7 F (37.1 C) (Oral)   Resp 20   Wt 92.5 kg   SpO2 100%   BMI 33.95 kg/m  Physical Exam Vitals and nursing note reviewed.  Constitutional:      General: She is not in acute distress.    Appearance: She is well-developed.  HENT:     Head: Normocephalic and atraumatic.  Eyes:     Conjunctiva/sclera: Conjunctivae normal.  Cardiovascular:     Rate and Rhythm: Normal rate and regular rhythm.     Heart sounds: No murmur heard. Pulmonary:     Effort: Pulmonary effort is normal. No respiratory distress.     Breath sounds: Examination of the right-upper field reveals wheezing. Wheezing  present.  Abdominal:     Palpations: Abdomen is soft.     Tenderness: There is abdominal tenderness in the epigastric area.  Musculoskeletal:        General: No swelling.     Cervical back: Neck supple.  Skin:    General: Skin is warm and dry.     Capillary Refill: Capillary refill takes less than 2 seconds.  Neurological:     Mental Status: She is alert.  Psychiatric:        Mood and Affect: Mood normal.     ED Results / Procedures / Treatments   Labs (all labs ordered are listed, but only abnormal results are  displayed) Labs Reviewed  RESP PANEL BY RT-PCR (RSV, FLU A&B, COVID)  RVPGX2 - Abnormal; Notable for the following components:      Result Value   SARS Coronavirus 2 by RT PCR POSITIVE (*)    All other components within normal limits  COMPREHENSIVE METABOLIC PANEL - Abnormal; Notable for the following components:   Sodium 134 (*)    Potassium 3.3 (*)    Chloride 97 (*)    Calcium 8.8 (*)    Total Protein 8.9 (*)    Total Bilirubin 1.4 (*)    All other components within normal limits  CBC WITH DIFFERENTIAL/PLATELET - Abnormal; Notable for the following components:   WBC 3.7 (*)    All other components within normal limits  URINALYSIS, ROUTINE W REFLEX MICROSCOPIC - Abnormal; Notable for the following components:   Ketones, ur 15 (*)    Leukocytes,Ua Larri Yehle (*)    All other components within normal limits  URINALYSIS, MICROSCOPIC (REFLEX) - Abnormal; Notable for the following components:   Bacteria, UA RARE (*)    All other components within normal limits  LACTIC ACID, PLASMA  LIPASE, BLOOD  MAGNESIUM  TROPONIN I (HIGH SENSITIVITY)    EKG EKG Interpretation  Date/Time:  Thursday March 19 2022 11:07:12 EST Ventricular Rate:  78 PR Interval:  166 QRS Duration: 97 QT Interval:  401 QTC Calculation: 457 R Axis:   11 Text Interpretation: Sinus rhythm Probable left ventricular hypertrophy No significant change since last tracing Confirmed by Deno Etienne  [phone]) on 03/19/2022 11:30:43 AM  Radiology DG Chest 2 View  Result Date: 03/19/2022 CLINICAL DATA:  Infection. EXAM: CHEST - 2 VIEW COMPARISON:  Chest x-ray February 26 23. FINDINGS: Mild right basilar opacities. No visible pleural effusions or pneumothorax. Cardiomediastinal silhouette is within normal limits. ACDF. IMPRESSION: Mild right basilar opacities, potentially early pneumonia versus atelectasis. Electronically Signed   By: Margaretha Sheffield M.D.   On: 03/19/2022 10:33    Procedures Procedures    Medications Ordered in ED Medications  cefTRIAXone (ROCEPHIN) 1 g in sodium chloride 0.9 % 100 mL IVPB (1 g Intravenous New Bag/Given 03/19/22 1648)  sodium chloride 0.9 % bolus 1,000 mL (0 mLs Intravenous Stopped 03/19/22 1307)  acetaminophen (TYLENOL) tablet 650 mg (650 mg Oral Given 03/19/22 1545)    ED Course/ Medical Decision Making/ A&P                             Medical Decision Making Patient is a 62 year old female, here for fatigue, and some epigastric discomfort.  She is just felt very out of it, for the last couple weeks, and feels very tired.  Denies any shortness of breath, cough fever, chills.  We will obtain labs, including troponins, chest x-ray for further evaluation as well as COVID/flu given her severity of her fatigue.  Chest x-ray will evaluate for possible perforated ulcer as she has a history of ulcers.  She denies any blood in her stool.  She is tender to the touch on her epigastric area, however there is no guarding or rebound.  She states that she has felt this a lot before, with her acid reflux/ulcers.  Amount and/or Complexity of Data Reviewed Labs: ordered.    Details: Unremarkable, normal troponins, COVID-positive. Radiology: ordered.    Details: Chest x-ray with possible right pneumonia. ECG/medicine tests:  Decision-making details documented in ED Course. Discussion of management or test interpretation with external provider(s): Discussed with patient,  find concerning for COVID, right pneumonia, urine unremarkable trauma troponins normal, chest x-ray shows no evidence of perforation from ulcer.  Pain improved, patient requesting to go home, he believes that her symptoms are likely secondary to SARS, COVID, and pneumonia.  We will treat her with third-generation cephalosporin, plus doxycycline given her methotrexate use, and treatment of pneumonia.  Return precautions emphasized, patient voiced understanding  Risk OTC drugs. Prescription drug management.    Final Clinical Impression(s) / ED Diagnoses Final diagnoses:  Other fatigue  COVID-19  Pneumonia of right lung due to infectious organism, unspecified part of lung    Rx / DC Orders ED Discharge Orders          Ordered    doxycycline (VIBRAMYCIN) 100 MG capsule  2 times daily        03/19/22 1649    cefpodoxime (VANTIN) 200 MG tablet  2 times daily        03/19/22 1649              Joahan Swatzell L, PA 03/19/22 1656    Deno Etienne, DO 03/20/22 0703

## 2022-03-19 NOTE — Discharge Instructions (Addendum)
Please follow-up with your primary care doctor, make sure you are drinking lots of fluids.  Take the antibiotics as prescribed, and return to the ER if you develop severe shortness of breath, fatigue, O2 saturations less than 90%, or severe chest pain.

## 2022-03-19 NOTE — ED Notes (Signed)
Presents with fatigue and poor appetite. Appears ill upon face to face presentation. GCS 15. Follows commands. A&O x 4

## 2022-03-26 ENCOUNTER — Encounter: Payer: Self-pay | Admitting: Radiology

## 2022-03-31 ENCOUNTER — Inpatient Hospital Stay: Payer: BC Managed Care – PPO | Admitting: Adult Health

## 2022-05-20 ENCOUNTER — Other Ambulatory Visit: Payer: Self-pay | Admitting: Family Medicine

## 2022-05-20 DIAGNOSIS — K219 Gastro-esophageal reflux disease without esophagitis: Secondary | ICD-10-CM

## 2022-06-16 ENCOUNTER — Emergency Department (HOSPITAL_BASED_OUTPATIENT_CLINIC_OR_DEPARTMENT_OTHER)
Admission: EM | Admit: 2022-06-16 | Discharge: 2022-06-16 | Disposition: A | Discharge status: Home / Self Care | Payer: BC Managed Care – PPO | Attending: Emergency Medicine | Admitting: Emergency Medicine

## 2022-06-16 ENCOUNTER — Emergency Department (HOSPITAL_BASED_OUTPATIENT_CLINIC_OR_DEPARTMENT_OTHER): Payer: BC Managed Care – PPO

## 2022-06-16 ENCOUNTER — Other Ambulatory Visit: Payer: Self-pay

## 2022-06-16 ENCOUNTER — Encounter (HOSPITAL_BASED_OUTPATIENT_CLINIC_OR_DEPARTMENT_OTHER): Payer: Self-pay | Admitting: Emergency Medicine

## 2022-06-16 DIAGNOSIS — I2510 Atherosclerotic heart disease of native coronary artery without angina pectoris: Secondary | ICD-10-CM | POA: Diagnosis not present

## 2022-06-16 DIAGNOSIS — R0789 Other chest pain: Secondary | ICD-10-CM | POA: Diagnosis present

## 2022-06-16 DIAGNOSIS — I10 Essential (primary) hypertension: Secondary | ICD-10-CM | POA: Diagnosis not present

## 2022-06-16 DIAGNOSIS — J45909 Unspecified asthma, uncomplicated: Secondary | ICD-10-CM | POA: Insufficient documentation

## 2022-06-16 LAB — CBC
HCT: 40.8 % (ref 36.0–46.0)
Hemoglobin: 12.7 g/dL (ref 12.0–15.0)
MCH: 29.5 pg (ref 26.0–34.0)
MCHC: 31.1 g/dL (ref 30.0–36.0)
MCV: 94.7 fL (ref 80.0–100.0)
Platelets: 311 10*3/uL (ref 150–400)
RBC: 4.31 MIL/uL (ref 3.87–5.11)
RDW: 15 % (ref 11.5–15.5)
WBC: 5.3 10*3/uL (ref 4.0–10.5)
nRBC: 0 % (ref 0.0–0.2)

## 2022-06-16 LAB — D-DIMER, QUANTITATIVE: D-Dimer, Quant: 0.88 ug/mL-FEU — ABNORMAL HIGH (ref 0.00–0.50)

## 2022-06-16 LAB — BASIC METABOLIC PANEL
Anion gap: 9 (ref 5–15)
BUN: 13 mg/dL (ref 6–20)
CO2: 28 mmol/L (ref 22–32)
Calcium: 8.6 mg/dL — ABNORMAL LOW (ref 8.9–10.3)
Chloride: 102 mmol/L (ref 98–111)
Creatinine, Ser: 0.88 mg/dL (ref 0.44–1.00)
GFR, Estimated: >60 mL/min (ref >60)
Glucose, Bld: 83 mg/dL (ref 70–99)
Potassium: 3.3 mmol/L — ABNORMAL LOW (ref 3.5–5.1)
Sodium: 139 mmol/L (ref 135–145)

## 2022-06-16 LAB — MAGNESIUM: Magnesium: 2 mg/dL (ref 1.7–2.4)

## 2022-06-16 LAB — TROPONIN I (HIGH SENSITIVITY)
Troponin I (High Sensitivity): 4 ng/L (ref <18)
Troponin I (High Sensitivity): 4 ng/L (ref <18)

## 2022-06-16 MED ORDER — IOHEXOL 350 MG/ML SOLN
75.0000 mL | Freq: Once | INTRAVENOUS | Status: AC | PRN
  Administered 2022-06-16: 75 mL via INTRAVENOUS

## 2022-06-16 MED ORDER — POTASSIUM CHLORIDE CRYS ER 20 MEQ PO TBCR
20.0000 meq | EXTENDED_RELEASE_TABLET | Freq: Once | ORAL | Status: AC
  Administered 2022-06-16: 20 meq via ORAL
  Filled 2022-06-16: qty 1

## 2022-06-16 MED ORDER — ALUM & MAG HYDROXIDE-SIMETH 200-200-20 MG/5ML PO SUSP
30.0000 mL | Freq: Once | ORAL | Status: AC
  Administered 2022-06-16: 30 mL via ORAL
  Filled 2022-06-16: qty 30

## 2022-06-16 MED ORDER — LIDOCAINE 5 % EX PTCH
1.0000 | MEDICATED_PATCH | CUTANEOUS | Status: DC
  Administered 2022-06-16: 1 via TRANSDERMAL
  Filled 2022-06-16: qty 1

## 2022-06-16 MED ORDER — LIDOCAINE VISCOUS HCL 2 % MT SOLN
15.0000 mL | Freq: Once | OROMUCOSAL | Status: AC
  Administered 2022-06-16: 15 mL via ORAL
  Filled 2022-06-16: qty 15

## 2022-06-16 NOTE — ED Provider Notes (Signed)
[facility] Provider Note   CSN: [id number] Arrival date & time: 06/16/22  1626     History {Add pertinent medical, surgical, social history, OB history to HPI:1} Chief Complaint  Patient presents with   Chest Pain    Patricia Reed is a 62 y.o. female.   Chest Pain      Home Medications Prior to Admission medications   Medication Sig Start Date End Date Taking? Authorizing Provider  acetaminophen (TYLENOL) 650 MG CR tablet Take 650 mg by mouth every 8 (eight) hours as needed for pain.    [provider]  albuterol (PROVENTIL) (2.5 MG/3ML) 0.083% nebulizer solution Take 3 mLs (2.5 mg total) by nebulization every 6 (six) hours as needed for wheezing or shortness of breath. 03/30/19   Glenford Bayley, NP  Ascorbic Acid (VITAMIN C PO) Take by mouth.    [provider]  B-D INS SYR ULTRAFINE 1CC/30G 30G X 1/2" 1 ML MISC  03/05/18   [provider]  baclofen (LIORESAL) 10 MG tablet TAKE 1 TABLET BY MOUTH AT BEDTIME AS NEEDED FOR MUSCLE SPASMS 08/28/19   [place], [name], MD  CALCIUM PO Take by mouth daily.    [provider]  Cholecalciferol 25 MCG (1000 UT) capsule Take by mouth.    [provider]  diclofenac sodium (VOLTAREN) 1 % GEL Apply 3 g to 3 large joints up to 3 times daily. Patient taking differently: Apply 2 g topically daily as needed (pain). 05/11/17   Gearldine Bienenstock, PA-C  doxycycline (VIBRAMYCIN) 100 MG capsule Take 1 capsule (100 mg total) by mouth 2 (two) times daily. 03/19/22   Small, Brooke L, PA  DULoxetine (CYMBALTA) 30 MG capsule Take 30 mg by mouth 2 (two) times daily.    [provider]  Estradiol 10 MCG TABS vaginal tablet Yuvafem 10 mcg vaginal tablet    [provider]  eszopiclone 3 MG TABS Take 1 tablet (3 mg total) by mouth at bedtime as needed. Take immediately before bedtime 12/02/21   [place], [name], MD  famotidine (PEPCID) 20 MG tablet TAKE  1 TO 2 TABLETS BY MOUTH AT BEDTIME 10/20/19   [place], [name], MD  fluticasone furoate-vilanterol (BREO ELLIPTA) 100-25 MCG/INH AEPB Inhale 1 puff into the lungs daily. 03/30/19   Glenford Bayley, NP  folic acid (FOLVITE) 1 MG tablet TAKE 2 TABLETS BY MOUTH EVERY DAY 11/24/21   Gearldine Bienenstock, PA-C  hydrochlorothiazide (HYDRODIURIL) 25 MG tablet TAKE 1 TABLET BY MOUTH EVERY DAY 09/17/21   [place], [name], MD  KLOR-CON M20 20 MEQ tablet TAKE 1 TABLET BY MOUTH EVERY DAY 09/17/21   [place], [name], MD  lovastatin (MEVACOR) 20 MG tablet TAKE 1 TABLET BY MOUTH EVERYDAY AT BEDTIME 05/20/22   [place], [name], MD  methotrexate 50 MG/2ML injection Inject 0.4 mL into skin once weekly. Patient taking differently: Inject 0.6 mL into skin once weekly. 05/07/21   Gearldine Bienenstock, PA-C  nystatin-triamcinolone (MYCOLOG II) cream Apply 1 application topically 2 (two) times daily. 01/24/20   Gearldine Bienenstock, PA-C  Omega-3 Fatty Acids (FISH OIL) 1000 MG CAPS Take by mouth.    [provider]  ondansetron (ZOFRAN) 4 MG tablet Take 1 tablet (4 mg total) by mouth every 8 (eight) hours as needed for nausea or vomiting. 02/19/22   Deeann Saint, MD  pantoprazole (PROTONIX) 40 MG tablet TAKE 1 TABLET BY MOUTH EVERY DAY BEFORE  BREAKFAST 05/20/22   [place], [name], MD  Tuberculin-Allergy Syringes (ALLERGY SYRINGE 1CC/27GX1/2") 27G X 1/2" 1 ML MISC Patient to use to inject MTX weekly 08/21/16   Pollyann Savoy, MD  valACYclovir (VALTREX) 500 MG tablet TAKE 1 TABLET (500 MG TOTAL) BY MOUTH 2 (TWO) TIMES DAILY. FOR 3 DAYS WITH ACUTE EPISODES. Patient taking differently: TAKE 1 TABLET (500 MG TOTAL) BY MOUTH 2 (TWO) TIMES DAILY. 04/17/19   [place], [name], MD  vitamin B-12 (CYANOCOBALAMIN) 100 MCG tablet Take 100 mcg by mouth daily.    [provider]  VITAMIN D, ERGOCALCIFEROL, PO Take 1 capsule by mouth daily.    [provider]  XELJANZ XR 11 MG TB24 Take 1 tablet by mouth daily. 03/02/18   [provider]  Zinc Citrate-Phytase (ZYTAZE) 25-500 MG CAPS Take by mouth.    [provider]      Allergies    Aspirin, Hydrocodone-acetaminophen, Meperidine hcl, Morphine, Oxycodone-acetaminophen, Ibuprofen, Codeine, Demerol [meperidine], Imdur [isosorbide nitrate], Isosorbide dinitrate, Procaine hcl, Toprol xl [metoprolol], Toradol [ketorolac tromethamine], and Tramadol    Review of Systems   Review of Systems  Cardiovascular:  Positive for chest pain.    Physical Exam Updated Vital Signs BP 133/82   Pulse (!) 102   Temp 97.7 F (36.5 C)   Resp (!) 24   Ht 5\' 5"  (1.651 m)   Wt 96.2 kg   SpO2 100%   BMI 35.28 kg/m  Physical Exam  ED Results / Procedures / Treatments   Labs (all labs ordered are listed, but only abnormal results are displayed) Labs Reviewed  BASIC METABOLIC PANEL - Abnormal; Notable for the following components:      Result Value   Potassium 3.3 (*)    Calcium 8.6 (*)    All other components within normal limits  CBC  TROPONIN I (HIGH SENSITIVITY)  TROPONIN I (HIGH SENSITIVITY)    EKG EKG Interpretation  Date/Time:  Tuesday Jun 16 2022 16:36:11 EDT Ventricular Rate:  88 PR Interval:  152 QRS Duration: 91 QT Interval:  328 QTC Calculation: 397 R Axis:   8 Text Interpretation: Sinus rhythm LVH with secondary repolarization abnormality Inferior infarct, age indeterminate Confirmed by Ernie Avena ([id number]) on 06/16/2022 6:24:37 PM  Radiology DG Chest 2 View  Result Date: 06/16/2022 CLINICAL DATA:  Chest pain. EXAM: CHEST - 2 VIEW COMPARISON:  Chest radiographs 03/19/2022 and 08/19/2021 FINDINGS: Cardiac silhouette and mediastinal contours are within normal limits. The lungs are clear. No pleural effusion or pneumothorax. No acute skeletal abnormality. Partial visualization of ACDF. IMPRESSION: No active cardiopulmonary disease. Electronically Signed   By: Neita Garnet M.D.   On: 06/16/2022 17:57    Procedures Procedures  {Document cardiac monitor,  telemetry assessment procedure when appropriate:1}  Medications Ordered in ED Medications - No data to display  ED Course/ Medical Decision Making/ A&P   {   Click here for ABCD2, HEART and other calculatorsREFRESH Note before signing :1}                          Medical Decision Making Amount and/or Complexity of Data Reviewed Labs: ordered. Radiology: ordered.   ***  {Document critical care time when appropriate:1} {Document review of labs and clinical decision tools ie heart score, Chads2Vasc2 etc:1}  {Document your independent review of radiology images, and any outside records:1} {Document your discussion with family members, caretakers, and with consultants:1} {Document social determinants of health affecting pt's care:1} {  Document your decision making why or why not admission, treatments were needed:1} Final Clinical Impression(s) / ED Diagnoses Final diagnoses:  None    Rx / DC Orders ED Discharge Orders     None

## 2022-06-16 NOTE — ED Triage Notes (Signed)
Patient arrives ambulatory by POV c/o pain to left side of chest, left side of face and left arm intermittently x 2 weeks. Describes pain as a stabbing sensation radiating into left shoulder blade.

## 2022-06-16 NOTE — Discharge Instructions (Addendum)
Cardiac workup was wearing and your symptoms are consistent with likely musculoskeletal pain.  Follow-up with your PCP or cardiologist for consideration for cardiac stress testing.  You did have an elevated D-dimer and a blood clot was ruled out by CT imaging.   CT results did show incidental findings: IMPRESSION:  No evidence of pulmonary emboli.    Enlarged right lobe of the thyroid which corresponds to previously  biopsied thyroid nodule. This has been evaluated on previous  imaging. (ref: J Am Coll Radiol. 2015 Feb;12(2): 143-50).    Tiny right upper lobe subpleural nodule measuring less than 5 mm. No  follow-up needed if patient is low-risk.This recommendation follows  the consensus statement: Guidelines for Management of Incidental  Pulmonary Nodules Detected on CT Images: From the Fleischner Society  2017; Radiology 2017; 284:228-243.

## 2022-06-18 ENCOUNTER — Ambulatory Visit (INDEPENDENT_AMBULATORY_CARE_PROVIDER_SITE_OTHER): Payer: BC Managed Care – PPO | Admitting: Physician Assistant

## 2022-06-18 ENCOUNTER — Telehealth: Payer: Self-pay | Admitting: Physician Assistant

## 2022-06-18 ENCOUNTER — Encounter: Payer: Self-pay | Admitting: Physician Assistant

## 2022-06-18 DIAGNOSIS — M1990 Unspecified osteoarthritis, unspecified site: Secondary | ICD-10-CM

## 2022-06-18 DIAGNOSIS — M25562 Pain in left knee: Secondary | ICD-10-CM

## 2022-06-18 DIAGNOSIS — M25561 Pain in right knee: Secondary | ICD-10-CM

## 2022-06-18 MED ORDER — LIDOCAINE HCL 1 % IJ SOLN
3.0000 mL | INTRAMUSCULAR | Status: AC | PRN
Start: 2022-06-18 — End: 2022-06-18
  Administered 2022-06-18: 3 mL

## 2022-06-18 MED ORDER — METHYLPREDNISOLONE ACETATE 40 MG/ML IJ SUSP
40.0000 mg | INTRAMUSCULAR | Status: AC | PRN
  Administered 2022-06-18: 40 mg via INTRA_ARTICULAR

## 2022-06-18 MED ORDER — METHYLPREDNISOLONE ACETATE 40 MG/ML IJ SUSP
40.0000 mg | INTRAMUSCULAR | Status: AC | PRN
Start: 2022-06-18 — End: 2022-06-18
  Administered 2022-06-18: 40 mg via INTRA_ARTICULAR

## 2022-06-18 NOTE — Telephone Encounter (Signed)
Patient asking if sh can get a note stating she was seen in the office today by [facility]. Patient asking for it to be put in [facility]

## 2022-06-18 NOTE — Progress Notes (Signed)
Office Visit Note   [name]           Date of Birth: [birth year]           MRN: [id number] Visit Date: 06/18/2022              Requested by: [place], [name], MD [address] [place],  [place] [address] PCP: [place], [name], MD  Chief Complaint  Patient presents with  . Right Knee - Pain  . 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  She periodically comes in for an injection into her knees.  She does get better relief from steroid rather than gel injections.  She comes in today requesting injections into her knees no new injury  Assessment & Plan: Visit Diagnoses: Osteoarthritis bilateral knees  Plan: Went forward with bilateral injections today.  She may follow-up as needed.  She understands to take it easy tonight.  Will follow-up with me as needed  Follow-Up Instructions: As needed  Ortho Exam  Patient is alert, oriented, no adenopathy, well-dressed, normal affect, normal respiratory effort. Bilateral knees no effusion no erythema.  She has tenderness more over the medial and lateral joint line.  She has good range of motion and good stability compartments are soft and nontender she is neurovascularly intact  Imaging: No results found. No images are attached to the encounter.  Labs: Lab Results  Component Value Date   HGBA1C 6.1 03/19/2021   HGBA1C 6.2 08/20/2020   ESRSEDRATE 31 (H) 09/23/2021   ESRSEDRATE 36 (H) 05/15/2021   ESRSEDRATE 17 06/24/2020   CRP 0.6 10/11/2015   REPTSTATUS 02/10/2013 FINAL 02/08/2013   CULT  02/08/2013    No Beta Hemolytic Streptococci Isolated Performed at [facility]     Lab Results  Component Value Date   ALBUMIN 4.1 03/19/2022   ALBUMIN 4.3 03/16/2021   ALBUMIN 3.9 05/27/2019    Lab Results  Component Value Date   MG 2.0 06/16/2022   MG 2.0 03/19/2022   MG 1.8 03/16/2021   Lab  Results  Component Value Date   VD25OH 44 01/22/2020   VD25OH 36 03/30/2017   VD25OH 38 08/19/2016    No results found for: "PREALBUMIN"    Latest Ref Rng & Units 06/16/2022    4:40 PM 03/19/2022   10:20 AM 01/06/2022   11:14 AM  CBC EXTENDED  WBC 4.0 - 10.5 K/uL 5.3  3.7  3.7   RBC 3.87 - 5.11 MIL/uL 4.31  4.77  4.04   Hemoglobin 12.0 - 15.0 g/dL 19.1  47.8  29.5   HCT 36.0 - 46.0 % 40.8  45.2  36.6   Platelets 150 - 400 K/uL 311  277  300   NEUT# 1.7 - 7.7 K/uL  2.6  2,172   Lymph# 0.7 - 4.0 K/uL  0.7  1,077      There is no height or weight on file to calculate BMI.  Orders:  No orders of the defined types were placed in this encounter.  No orders of the defined types were placed in this encounter.    Procedures: Large Joint Inj: bilateral knee on 06/18/2022 2:16 PM Indications: pain and diagnostic evaluation Details: 25 G 1.5 in needle, anteromedial approach  Arthrogram: No  Medications (Right): 3 mL lidocaine 1 %; 40 mg methylPREDNISolone acetate 40 MG/ML Medications (Left): 3 mL  lidocaine 1 %; 40 mg methylPREDNISolone acetate 40 MG/ML Outcome: tolerated well, no immediate complications Procedure, treatment alternatives, risks and benefits explained, specific risks discussed. Consent was given by the patient.    Clinical Data: No additional findings.  ROS:  All other systems negative, except as noted in the HPI. Review of Systems  Objective: Vital Signs: There were no vitals taken for this visit.  Specialty Comments:  No specialty comments available.  PMFS History: Patient Active Problem List   Diagnosis Date Noted  . Moderate obstructive sleep apnea 07/11/2021  . Daytime sleepiness 03/20/2021  . Prediabetes 03/19/2021  . Hypokalemia 07/07/2019  . Precordial chest pain 05/04/2019  . Educated about COVID-19 virus infection 05/04/2019  . Persistent vomiting   . Abdominal pain, epigastric   . Nausea & vomiting 03/11/2019  . Gastritis   . Nausea  without vomiting 02/28/2019  . Class 2 obesity with body mass index (BMI) of 37.0 to 37.9 in adult 11/25/2018  . Mixed connective tissue disease (HCC) 08/14/2017  . Recurrent genital herpes 05/12/2017  . Shortness of breath 04/15/2017  . Dizziness 02/04/2017  . Situational anxiety 06/08/2016  . Atypical chest pain 06/08/2016  . Trapezius muscle spasm 04/23/2016  . Fibromyalgia 02/13/2016  . 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 Chronic fatigue 02/13/2016  . Vitamin D deficiency 11/25/2015  . Autoimmune disease (HCC) 11/23/2015  . High risk medication use 11/23/2015  . Colon cancer screening 01/25/2014  . Anterior neck pain 12/01/2013  . Inflammatory arthritis 08/18/2012  . Allergic rhinitis 06/23/2012  . HTN (hypertension) 06/23/2012  . Hyperlipidemia 06/23/2012  . Thyroid nodule - benign 06/23/2012  . Arthralgia 06/23/2012  . Mild intermittent asthma   . Sickle cell trait (HCC)   . History of stomach ulcers   . ESOPHAGEAL STRICTURE 10/04/2007  . GERD 10/04/2007  . 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 Dysphagia, pharyngoesophageal phase 10/04/2007   Past Medical History:  Diagnosis Date  . Allergy   . Anemia   . Asthma   . Blood transfusion without reported diagnosis   . Fibromyalgia   . GERD (gastroesophageal reflux disease)   . Heart murmur   . High cholesterol   . History of stomach ulcers   . Hypertension   . Lupus (HCC)   . MI, old   . Moderate obstructive sleep apnea 07/11/2021  . Osteoarthritis   . Rheumatoid arthritis (HCC)   . Thyroid disease   . Thyroid mass   . Vertigo     Family History  Problem Relation Age of Onset  . Alcohol abuse Father   . Hypertension Father   . Heart disease Father   . Other Mother        tobacco use disorder, refuses to see a doctor  . Osteoporosis Sister   . Colon cancer Maternal Uncle   . Diabetes Maternal Grandmother   . Healthy Son   . Healthy Daughter   . Multiple sclerosis Daughter   . Colon polyps Neg Hx   . Esophageal  cancer Neg Hx   . Kidney disease Neg Hx   . Stomach cancer Neg Hx   . Rectal cancer Neg Hx     Past Surgical History:  Procedure Laterality Date  . ABDOMINAL HYSTERECTOMY     done for menorrhagia, ovaries remain  . CERVICAL DISCECTOMY     plates and screws in neck  . CESAREAN SECTION    . COLONOSCOPY    . ESOPHAGEAL MANOMETRY N/A 02/12/2014   Procedure: ESOPHAGEAL MANOMETRY (EM);  Surgeon: Louis Meckel, MD;  Location: [facility];  Service: Endoscopy;  Laterality: N/A;  . ESOPHAGOGASTRODUODENOSCOPY  03/04/2011   Procedure: ESOPHAGOGASTRODUODENOSCOPY (EGD);  Surgeon: Louis Meckel, MD;  Location: [facility];  Service: Endoscopy;  Laterality: N/A;  BOTOX Injection  . ESOPHAGOGASTRODUODENOSCOPY (EGD) WITH PROPOFOL N/A 03/12/2019   Procedure: ESOPHAGOGASTRODUODENOSCOPY (EGD) WITH PROPOFOL;  Surgeon: Jeani Hawking, MD;  Location: [facility];  Service: Endoscopy;  Laterality: N/A;  . exc benign breast lump    . TONSILLECTOMY    . UPPER GASTROINTESTINAL ENDOSCOPY     Social History   Occupational History  . Occupation: Lobbyist: [employer]  Tobacco Use  . Smoking status: Never    Passive exposure: Never  . Smokeless tobacco: Never  Vaping Use  . Vaping Use: Never used  Substance and Sexual Activity  . Alcohol use: Not Currently    Alcohol/week: 1.0 standard drink of alcohol    Types: 1 Glasses of wine per week    Comment: 1 glass every 3 months  . Drug use: No  . Sexual activity: Yes    Birth control/protection: None, Surgical    Comment: LAVH

## 2022-06-24 NOTE — Progress Notes (Signed)
Office Visit Note  [name]             Date of Birth: 22-Mar-1960           MRN: [id number]             PCP: [place], [name], MD Referring: [place], [name], MD Visit Date: 07/08/2022 Occupation: @GUAROCC @  Subjective:  Muscle spasms   History of Present Illness: Patricia Reed is a 62 y.o. female with history of mixed connective tissue disease, osteoarthritis, and fibromyalgia. She remains on Xeljanz 11 mg XR by mouth daily, MTX 0.6 ml sq once weekly (reduced dose low WBC count), and folic acid 2 mg daily.  She is tolerating combination therapy without any side effects.  She denies any signs or symptoms of a flare recently.  Patient states that she has been eating tart cherries on a daily basis which she feels has helped with her inflammation.  She has not required prednisone recently.  She denies any joint swelling at this time.  Patient states that she has occasional muscle cramps and muscle spasms.  She requested a refill of baclofen to be sent to the pharmacy today.  Patient states she is also been taking magnesium which has been helpful.    Activities of Daily Living:  Patient reports morning stiffness for 4 hours.   Patient Reports nocturnal pain.  Difficulty dressing/grooming: Denies Difficulty climbing stairs: Reports Difficulty getting out of chair: Denies Difficulty using hands for taps, buttons, cutlery, and/or writing: Reports  Review of Systems  Constitutional:  Positive for fatigue.  HENT:  Positive for mouth sores and mouth dryness.   Eyes:  Negative for dryness.  Cardiovascular:  Positive for palpitations.  Gastrointestinal:  Positive for constipation. Negative for blood in stool and diarrhea.  Endocrine: Negative for increased urination.  Genitourinary:  Negative for involuntary urination.  Musculoskeletal:  Positive for joint pain, gait problem, joint pain, joint swelling, myalgias, muscle weakness, morning stiffness, muscle tenderness and  myalgias.  Skin:  Positive for sensitivity to sunlight. Negative for color change, rash and hair loss.  Allergic/Immunologic: Positive for susceptible to infections.  Neurological:  Positive for headaches. Negative for dizziness.  Hematological:  Positive for swollen glands.  Psychiatric/Behavioral:  Positive for depressed mood and sleep disturbance. The patient is not nervous/anxious.     PMFS History:  Patient Active Problem List   Diagnosis Date Noted   Moderate obstructive sleep apnea 07/11/2021   Daytime sleepiness 03/20/2021   Prediabetes 03/19/2021   Hypokalemia 07/07/2019   Precordial chest pain 05/04/2019   Educated about COVID-19 virus infection 05/04/2019   Persistent vomiting    Abdominal pain, epigastric    Nausea & vomiting 03/11/2019   Gastritis    Nausea without vomiting 02/28/2019   Class 2 obesity with body mass index (BMI) of 37.0 to 37.9 in adult 11/25/2018   Mixed connective tissue disease (HCC) 08/14/2017   Recurrent genital herpes 05/12/2017   Shortness of breath 04/15/2017   Dizziness 02/04/2017   Situational anxiety 06/08/2016   Atypical chest pain 06/08/2016   Trapezius muscle spasm 04/23/2016   Fibromyalgia 02/13/2016   Primary insomnia 02/13/2016   Chronic fatigue 02/13/2016   Vitamin D deficiency 11/25/2015   Autoimmune disease (HCC) 11/23/2015   High risk medication use 11/23/2015   Colon cancer screening 01/25/2014   Anterior neck pain 12/01/2013   Inflammatory arthritis 08/18/2012   Allergic rhinitis 06/23/2012   HTN (hypertension) 06/23/2012   Hyperlipidemia 06/23/2012  Thyroid nodule - benign 06/23/2012   Arthralgia 06/23/2012   Mild intermittent asthma    Sickle cell trait (HCC)    History of stomach ulcers    ESOPHAGEAL STRICTURE 10/04/2007   GERD 10/04/2007   Atrophic 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   Dysphagia, pharyngoesophageal phase 10/04/2007    Past Medical History:  Diagnosis Date   Allergy    Anemia    Asthma    Blood  transfusion without reported diagnosis    Fibromyalgia    GERD (gastroesophageal reflux disease)    Heart murmur    High cholesterol    History of stomach ulcers    Hypertension    Lupus (HCC)    MI, old    Moderate obstructive sleep apnea 07/11/2021   Osteoarthritis    Rheumatoid arthritis (HCC)    Thyroid disease    Thyroid mass    Vertigo     Family History  Problem Relation Age of Onset   Alcohol abuse Father    Hypertension Father    Heart disease Father    Other Mother        tobacco use disorder, refuses to see a doctor   Osteoporosis Sister    Colon cancer Maternal Uncle    Diabetes Maternal Grandmother    Healthy Son    Healthy Daughter    Multiple sclerosis Daughter    Colon polyps Neg Hx    Esophageal cancer Neg Hx    Kidney disease Neg Hx    Stomach cancer Neg Hx    Rectal cancer Neg Hx    Past Surgical History:  Procedure Laterality Date   ABDOMINAL HYSTERECTOMY     done for menorrhagia, ovaries remain   CERVICAL DISCECTOMY     plates and screws in neck   CESAREAN SECTION     COLONOSCOPY     ESOPHAGEAL MANOMETRY N/A 02/12/2014   Procedure: ESOPHAGEAL MANOMETRY (EM);  Surgeon: Louis Meckel, MD;  Location: [facility];  Service: Endoscopy;  Laterality: N/A;   ESOPHAGOGASTRODUODENOSCOPY  03/04/2011   Procedure: ESOPHAGOGASTRODUODENOSCOPY (EGD);  Surgeon: Louis Meckel, MD;  Location: [facility];  Service: Endoscopy;  Laterality: N/A;  BOTOX Injection   ESOPHAGOGASTRODUODENOSCOPY (EGD) WITH PROPOFOL N/A 03/12/2019   Procedure: ESOPHAGOGASTRODUODENOSCOPY (EGD) WITH PROPOFOL;  Surgeon: Jeani Hawking, MD;  Location: [facility] ENDOSCOPY;  Service: Endoscopy;  Laterality: N/A;   exc benign breast lump     TONSILLECTOMY     UPPER GASTROINTESTINAL ENDOSCOPY     Social History   Social History Narrative   First grade Geologist, 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     Immunization History  Administered Date(s) Administered   COVID-19, mRNA,  vaccine(Comirnaty)12 years and older 11/16/2021   Influenza Inj Mdck Quad Pf 10/22/2020   Influenza Split 11/04/2015, 10/11/2017, 10/21/2018   Influenza, Quadrivalent, Recombinant, Inj, Pf 10/11/2017   Influenza,inj,Quad PF,6+ Mos 11/04/2015, 10/21/2018, 09/23/2021   Influenza-Unspecified 10/11/2017   PFIZER Comirnaty(Gray Top)Covid-19 Tri-Sucrose Vaccine 05/10/2020   PFIZER(Purple Top)SARS-COV-2 Vaccination 03/20/2019, 04/12/2019, 12/07/2019, 05/10/2020   PNEUMOCOCCAL CONJUGATE-20 10/22/2020   Pneumococcal Polysaccharide-23 01/09/2020   Tdap 08/20/2020   Zoster Recombinat (Shingrix) 10/11/2017, 12/25/2017, 01/09/2020   Zoster, Unspecified 10/11/2017, 12/25/2017     Objective: Vital Signs: BP 120/89 (BP Location: Left Arm, Patient Position: Sitting, Cuff Size: Normal)   Pulse (!) 101   Resp 15   Ht 5\' 5"  (1.651 m)   Wt 216 lb (98 kg)   BMI 35.94 kg/m    Physical Exam Vitals and nursing note  reviewed.  Constitutional:      Appearance: She is well-developed.  HENT:     Head: Normocephalic and atraumatic.  Eyes:     Conjunctiva/sclera: Conjunctivae normal.  Cardiovascular:     Rate and Rhythm: Normal rate and regular rhythm.     Heart sounds: Normal heart sounds.  Pulmonary:     Effort: Pulmonary effort is normal.     Breath sounds: Normal breath sounds.  Abdominal:     General: Bowel sounds are normal.     Palpations: Abdomen is soft.  Musculoskeletal:     Cervical back: Normal range of motion.  Lymphadenopathy:     Cervical: No cervical adenopathy.  Skin:    General: Skin is warm and dry.     Capillary Refill: Capillary refill takes less than 2 seconds.  Neurological:     Mental Status: She is alert and oriented to person, place, and time.  Psychiatric:        Behavior: Behavior normal.      Musculoskeletal Exam: Generalized hyperalgesia and positive tender points on exam.  C-spine, thoracic spine, lumbar spine have good range of motion.  Shoulder joints, elbow  joints, wrist joints, MCPs, PIPs, DIPs have good range of motion with no synovitis.  Complete fist formation bilaterally.  Hip joints have good range of motion with no groin pain.  Knee joints have good range of motion with no warmth or effusion.  Ankle joints have good range of motion with no tenderness or joint swelling.  CDAI Exam: CDAI Score: -- Patient Global: 0 / 100; Provider Global: 0 / 100 Swollen: --; Tender: -- Joint Exam 07/08/2022   No joint exam has been documented for this visit   There is currently no information documented on the homunculus. Go to the Rheumatology activity and complete the homunculus joint exam.  Investigation: No additional findings.  Imaging: CT Angio Chest PE W and/or Wo Contrast  Result Date: 06/16/2022 CLINICAL DATA:  Positive D-dimer and chest pain, initial encounter EXAM: CT ANGIOGRAPHY CHEST WITH CONTRAST TECHNIQUE: Multidetector CT imaging of the chest was performed using the standard protocol during bolus administration of intravenous contrast. Multiplanar CT image reconstructions and MIPs were obtained to evaluate the vascular anatomy. RADIATION DOSE REDUCTION: This exam was performed according to the departmental dose-optimization program which includes automated exposure control, adjustment of the mA and/or kV according to patient size and/or use of iterative reconstruction technique. CONTRAST:  75mL OMNIPAQUE IOHEXOL 350 MG/ML SOLN COMPARISON:  08/19/2021 CT FINDINGS: Cardiovascular: Thoracic aorta shows no aneurysmal dilatation for dissection. Heart is at the upper limits of normal in size. The pulmonary artery shows a normal branching pattern without intraluminal filling defect to suggest pulmonary embolism. Mediastinum/Nodes: Esophagus is within normal limits. No hilar or mediastinal adenopathy is noted. The thoracic inlet shows enlargement of the right lobe of the thyroid without discrete nodule. This is stable from prior CT examination and has  been evaluated on prior ultrasound examination. Lungs/Pleura: Lungs are well aerated bilaterally. No focal infiltrate or sizable effusion is seen. Tiny subpleural nodule is noted in the right upper lobe stable from the prior exam. No further follow-up is recommended. Upper Abdomen: Peripherally enhancing lesion is again noted in the left lobe of the liver stable in appearance from the prior exam. This likely represents a small hemangioma. No other focal abnormality is noted. Musculoskeletal: Noted changes of the thoracic spine are seen. No acute bony abnormality is seen. Review of the MIP images confirms the above findings. IMPRESSION:  No evidence of pulmonary emboli. Enlarged right lobe of the thyroid which corresponds to previously biopsied thyroid nodule. This has been evaluated on previous imaging. (ref: J Am Coll Radiol. 2015 Feb;12(2): 143-50). Tiny right upper lobe subpleural nodule measuring less than 5 mm. No follow-up needed if patient is low-risk.This recommendation follows the consensus statement: Guidelines for Management of Incidental Pulmonary Nodules Detected on CT Images: From the Fleischner Society 2017; Radiology 2017; 284:228-243. Electronically Signed   By: Alcide Clever M.D.   On: 06/16/2022 20:53   DG Chest 2 View  Result Date: 06/16/2022 CLINICAL DATA:  Chest pain. EXAM: CHEST - 2 VIEW COMPARISON:  Chest radiographs 03/19/2022 and 08/19/2021 FINDINGS: Cardiac silhouette and mediastinal contours are within normal limits. The lungs are clear. No pleural effusion or pneumothorax. No acute skeletal abnormality. Partial visualization of ACDF. IMPRESSION: No active cardiopulmonary disease. Electronically Signed   By: Neita Garnet M.D.   On: 06/16/2022 17:57    Recent Labs: Lab Results  Component Value Date   WBC 5.3 06/16/2022   HGB 12.7 06/16/2022   PLT 311 06/16/2022   NA 139 06/16/2022   K 3.3 (L) 06/16/2022   CL 102 06/16/2022   CO2 28 06/16/2022   GLUCOSE 83 06/16/2022   BUN  13 06/16/2022   CREATININE 0.88 06/16/2022   BILITOT 1.4 (H) 03/19/2022   ALKPHOS 69 03/19/2022   AST 22 03/19/2022   ALT 17 03/19/2022   PROT 8.9 (H) 03/19/2022   ALBUMIN 4.1 03/19/2022   CALCIUM 8.6 (L) 06/16/2022   GFRAA 97 06/24/2020   QFTBGOLDPLUS NEGATIVE 05/15/2021    Speciality Comments: No specialty comments available.  Procedures:  No procedures performed Allergies: Aspirin, Hydrocodone-acetaminophen, Meperidine hcl, Morphine, Oxycodone-acetaminophen, Ibuprofen, Codeine, Demerol [meperidine], Imdur [isosorbide nitrate], Isosorbide dinitrate, Procaine hcl, Toprol xl [metoprolol], Toradol [ketorolac tromethamine], and Tramadol     Assessment / Plan:     Visit Diagnoses: Mixed connective tissue disease (HCC) - +ANA,+Sm,+RNP,+RF, arthritis: She has not had any signs or symptoms of a flare recently.  She has clinically been doing well on Xeljanz and methotrexate as combination therapy.  She is tolerating combination therapy without any side effects.  She has no synovitis on examination today.  Her inflammation has been well controlled on the current treatment regimen.  Her energy level has been stable.  She has been experiencing less daytime drowsiness since using a CPAP.  She continues to have mouth dryness and occasional sores in her mouth but has not had any other new or worsening symptoms.  She has not had any recent rashes and has been trying to avoid direct sun exposure.  She wears sunscreen SPF 50 on a daily basis. Discussed the list of natural anti-inflammatories including turmeric, tart cherry, ginger, and omega-3. Lab work from 09/23/2021 was reviewed today in the office: Double-stranded DNA negative, C3-4, C4 within normal limits, ESR 31, and protein creatinine ratio WNL.  The following lab work will be updated today.  She will remain on combination therapy as prescribed.  She was advised notify us if she develops signs or symptoms of a flare.  She will follow-up in the office  in 5 months or sooner if needed. - Plan: QuantiFERON-TB Gold Plus, Protein / creatinine ratio, urine, ANA, Anti-DNA antibody, double-stranded, C3 and C4, VITAMIN D 25 Hydroxy (Vit-D Deficiency, Fractures), Sedimentation rate, Hepatic function panel, ANA, RNP Antibody  High risk medication use - Xeljanz 11 mg XR by mouth daily, MTX 0.6 ml sq once weekly (reduced dose low  WBC count), and folic acid 2 mg daily.  CBC and BMP updated on 06/16/22.  Hepatic function panel will be checked today.  Her next lab work will be due in August and every 3 months.  TB gold negative on 05/15/21. Order for TB gold released today.   Lipid panel--future order placed.  Discussed the importance of holding xeljanz and methotrexate if she develops signs or symptoms of an infection and to resume once the infection has completely cleared.  - Plan: QuantiFERON-TB Gold Plus, Hepatic function panel  Screening for tuberculosis -Order for TB gold released today. Plan: QuantiFERON-TB Gold Plus  Long term systemic steroid user: Not currently taking prednisone.  Chronic pain of both shoulders: Good range of motion about shoulder joints on examination today.  Some tenderness over the subacromial bursa of the right shoulder.  Trochanteric bursitis of both hips: Not currently symptomatic.  Encourage patient to perform stretching exercises daily.  Primary osteoarthritis of both knees: Good range of motion of both knee joints on examination today.  No warmth or effusion noted.  Fibromyalgia - She has generalized hyperalgesia and positive tender points on examination.  Patient continues to experience intermittent myalgias and muscle tenderness due to fibromyalgia.  She has had episodic muscle cramping and spasms.  She has started to take magnesium on a daily basis which she has found to be helpful.  She requested a refill of baclofen to be sent to the pharmacy today which she has found to be helpful in the past.  She remains on Cymbalta  as prescribed.  - Plan: baclofen (LIORESAL) 10 MG tablet  Primary insomnia: She is taking eszopiclone 3 mg at bedtime.  Discussed the importance of good sleep hygiene.  Chronic fatigue: Her energy level has been stable.  She has been sleeping better at night since using a CPAP.  Her daytime fatigue and drowsiness has improved significantly.  Last night she slept over 10 hours.  Discussed the importance of regular exercise and good sleep hygiene.  Other medical conditions are listed as follows:  History of hypertension: Blood pressure was 120/89 today in the office.  History of high cholesterol: Future order for lipid panel placed today.  History of gastroesophageal reflux (GERD)  History of asthma  History of stomach ulcers  OSA (obstructive sleep apnea)  Osteoporosis screening - DEXA updated on 02/12/20 T-sore -0.3  Vitamin D deficiency - Vitamin D will be rechecked today. Plan: VITAMIN D 25 Hydroxy (Vit-D Deficiency, Fractures)  Lipid screening - Future order for lipid panel placed today.  Plan: Lipid panel    Orders: Orders Placed This Encounter  Procedures   QuantiFERON-TB Gold Plus   Protein / creatinine ratio, urine   ANA   Anti-DNA antibody, double-stranded   C3 and C4   VITAMIN D 25 Hydroxy (Vit-D Deficiency, Fractures)   Sedimentation rate   Lipid panel   Hepatic function panel   ANA   RNP Antibody   Meds ordered this encounter  Medications   baclofen (LIORESAL) 10 MG tablet    Sig: Take 1 tablet by mouth daily as needed for muscle spasms.    Dispense:  90 tablet    Refill:  0     Follow-Up Instructions: Return in about 5 months (around 12/08/2022) for Mixed connective tissue disease, Fibromyalgia.   Gearldine Bienenstock, PA-C  Note - This record has been created using Dragon software.  Chart creation errors have been sought, but may not always  have been located. Such creation errors  do not reflect on  the standard of medical care.

## 2022-07-08 ENCOUNTER — Ambulatory Visit: Payer: BC Managed Care – PPO | Attending: Rheumatology | Admitting: Physician Assistant

## 2022-07-08 ENCOUNTER — Encounter: Payer: Self-pay | Admitting: Physician Assistant

## 2022-07-08 ENCOUNTER — Ambulatory Visit: Payer: BC Managed Care – PPO | Admitting: Rheumatology

## 2022-07-08 VITALS — BP 120/89 | HR 101 | Resp 15 | Ht 65.0 in | Wt 216.0 lb

## 2022-07-08 DIAGNOSIS — M7062 Trochanteric bursitis, left hip: Secondary | ICD-10-CM

## 2022-07-08 DIAGNOSIS — M25511 Pain in right shoulder: Secondary | ICD-10-CM | POA: Diagnosis not present

## 2022-07-08 DIAGNOSIS — Z8719 Personal history of other diseases of the digestive system: Secondary | ICD-10-CM

## 2022-07-08 DIAGNOSIS — M170 Bilateral primary osteoarthritis of knee: Secondary | ICD-10-CM

## 2022-07-08 DIAGNOSIS — G4733 Obstructive sleep apnea (adult) (pediatric): Secondary | ICD-10-CM

## 2022-07-08 DIAGNOSIS — Z79899 Other long term (current) drug therapy: Secondary | ICD-10-CM

## 2022-07-08 DIAGNOSIS — Z13820 Encounter for screening for osteoporosis: Secondary | ICD-10-CM

## 2022-07-08 DIAGNOSIS — Z7952 Long term (current) use of systemic steroids: Secondary | ICD-10-CM

## 2022-07-08 DIAGNOSIS — M351 Other overlap syndromes: Secondary | ICD-10-CM | POA: Diagnosis not present

## 2022-07-08 DIAGNOSIS — F5101 Primary insomnia: Secondary | ICD-10-CM

## 2022-07-08 DIAGNOSIS — M25512 Pain in left shoulder: Secondary | ICD-10-CM

## 2022-07-08 DIAGNOSIS — E559 Vitamin D deficiency, unspecified: Secondary | ICD-10-CM

## 2022-07-08 DIAGNOSIS — M7061 Trochanteric bursitis, right hip: Secondary | ICD-10-CM

## 2022-07-08 DIAGNOSIS — Z111 Encounter for screening for respiratory tuberculosis: Secondary | ICD-10-CM

## 2022-07-08 DIAGNOSIS — M797 Fibromyalgia: Secondary | ICD-10-CM

## 2022-07-08 DIAGNOSIS — Z8679 Personal history of other diseases of the circulatory system: Secondary | ICD-10-CM

## 2022-07-08 DIAGNOSIS — R5382 Chronic fatigue, unspecified: Secondary | ICD-10-CM

## 2022-07-08 DIAGNOSIS — Z8711 Personal history of peptic ulcer disease: Secondary | ICD-10-CM

## 2022-07-08 DIAGNOSIS — Z13220 Encounter for screening for lipoid disorders: Secondary | ICD-10-CM

## 2022-07-08 DIAGNOSIS — G8929 Other chronic pain: Secondary | ICD-10-CM

## 2022-07-08 DIAGNOSIS — Z8639 Personal history of other endocrine, nutritional and metabolic disease: Secondary | ICD-10-CM

## 2022-07-08 DIAGNOSIS — Z8709 Personal history of other diseases of the respiratory system: Secondary | ICD-10-CM

## 2022-07-08 MED ORDER — BACLOFEN 10 MG PO TABS: ORAL_TABLET | ORAL | 0 refills | Status: DC

## 2022-07-09 LAB — C3 AND C4
C3 Complement: 206 mg/dL — ABNORMAL HIGH (ref 83–193)
C4 Complement: 44 mg/dL (ref 15–57)

## 2022-07-09 LAB — HEPATIC FUNCTION PANEL
AST: 18 U/L (ref 10–35)
Globulin: 3.2 g/dL (calc) (ref 1.9–3.7)
Total Protein: 7.3 g/dL (ref 6.1–8.1)

## 2022-07-09 NOTE — Progress Notes (Signed)
ESR remains elevated.   Hepatic function panel WNL  Protein creatinine ratio WNL Vitamin D WNL. C3 is slightly elevated. C4 WNL.

## 2022-07-10 LAB — QUANTIFERON-TB GOLD PLUS
NIL: 0.02 IU/mL
QuantiFERON-TB Gold Plus: NEGATIVE

## 2022-07-10 LAB — PROTEIN / CREATININE RATIO, URINE
Creatinine, Urine: 134 mg/dL (ref 20–275)
Protein/Creat Ratio: 60 mg/g creat (ref 24–184)
Protein/Creatinine Ratio: 0.06 mg/mg creat (ref 0.024–0.184)
Total Protein, Urine: 8 mg/dL (ref 5–24)

## 2022-07-10 LAB — HEPATIC FUNCTION PANEL
AG Ratio: 1.3 (calc) (ref 1.0–2.5)
Alkaline phosphatase (APISO): 74 U/L (ref 37–153)

## 2022-07-10 LAB — RNP ANTIBODY: Ribonucleic Protein(ENA) Antibody, IgG: >8 AI — AB

## 2022-07-11 LAB — VITAMIN D 25 HYDROXY (VIT D DEFICIENCY, FRACTURES): Vit D, 25-Hydroxy: 53 ng/mL (ref 30–100)

## 2022-07-11 LAB — HEPATIC FUNCTION PANEL
ALT: 16 U/L (ref 6–29)
Albumin: 4.1 g/dL (ref 3.6–5.1)
Bilirubin, Direct: 0.1 mg/dL (ref 0.0–0.2)
Indirect Bilirubin: 0.5 mg/dL (calc) (ref 0.2–1.2)
Total Bilirubin: 0.6 mg/dL (ref 0.2–1.2)

## 2022-07-11 LAB — ANTI-NUCLEAR AB-TITER (ANA TITER)
ANA TITER: 1:1280 {titer} — ABNORMAL HIGH
ANA Titer 1: 1:1280 {titer} — ABNORMAL HIGH

## 2022-07-11 LAB — ANTI-DNA ANTIBODY, DOUBLE-STRANDED: ds DNA Ab: <1 IU/mL

## 2022-07-11 LAB — QUANTIFERON-TB GOLD PLUS
Mitogen-NIL: 9.44 IU/mL
TB1-NIL: 0 IU/mL
TB2-NIL: 0 IU/mL

## 2022-07-11 LAB — ANA: Anti Nuclear Antibody (ANA): POSITIVE — AB

## 2022-07-11 LAB — SEDIMENTATION RATE: Sed Rate: 53 mm/h — ABNORMAL HIGH (ref 0–30)

## 2022-07-13 NOTE — Progress Notes (Signed)
ANA remains positive  RNP remains positive  TB gold negative  dsDNA is negative. Labs are not consistent with a flare. No change in therapy recommended at this time

## 2022-09-03 ENCOUNTER — Telehealth: Payer: BC Managed Care – PPO | Admitting: Adult Health

## 2022-09-24 ENCOUNTER — Other Ambulatory Visit: Payer: Self-pay | Admitting: Family Medicine

## 2022-09-24 DIAGNOSIS — I10 Essential (primary) hypertension: Secondary | ICD-10-CM

## 2022-10-04 ENCOUNTER — Other Ambulatory Visit: Payer: Self-pay | Admitting: Physician Assistant

## 2022-10-04 DIAGNOSIS — M797 Fibromyalgia: Secondary | ICD-10-CM

## 2022-10-05 NOTE — Telephone Encounter (Signed)
Last Fill: 07/08/2022  Next Visit: 12/09/2022  Last Visit: 07/08/2022  Dx: Fibromyalgia   Current Dose per office note on 07/08/2022: baclofen (LIORESAL) 10 MG tablet   Okay to refill Baclofen?

## 2022-10-07 ENCOUNTER — Other Ambulatory Visit: Payer: Self-pay | Admitting: *Deleted

## 2022-10-07 DIAGNOSIS — M351 Other overlap syndromes: Secondary | ICD-10-CM

## 2022-10-07 DIAGNOSIS — Z79899 Other long term (current) drug therapy: Secondary | ICD-10-CM

## 2022-10-07 DIAGNOSIS — Z13220 Encounter for screening for lipoid disorders: Secondary | ICD-10-CM

## 2022-10-15 LAB — CBC WITH DIFFERENTIAL/PLATELET
Absolute Monocytes: 221 {cells}/uL (ref 200–950)
Basophils Absolute: 19 cells/uL (ref 0–200)
Basophils Relative: 0.6 %
Eosinophils Absolute: 19 {cells}/uL (ref 15–500)
Eosinophils Relative: 0.6 %
HCT: 36.7 % (ref 35.0–45.0)
Hemoglobin: 11.6 g/dL — ABNORMAL LOW (ref 11.7–15.5)
Lymphs Abs: 483 {cells}/uL — ABNORMAL LOW (ref 850–3900)
MCH: 28.9 pg (ref 27.0–33.0)
MCHC: 31.6 g/dL — ABNORMAL LOW (ref 32.0–36.0)
MCV: 91.3 fL (ref 80.0–100.0)
MPV: 10.3 fL (ref 7.5–12.5)
Monocytes Relative: 6.9 %
Neutro Abs: 2458 cells/uL (ref 1500–7800)
Neutrophils Relative %: 76.8 %
Platelets: 315 10*3/uL (ref 140–400)
RBC: 4.02 10*6/uL (ref 3.80–5.10)
RDW: 14 % (ref 11.0–15.0)
Total Lymphocyte: 15.1 %
WBC: 3.2 10*3/uL — ABNORMAL LOW (ref 3.8–10.8)

## 2022-10-15 LAB — COMPLETE METABOLIC PANEL WITH GFR
AG Ratio: 1.4 (calc) (ref 1.0–2.5)
ALT: 14 U/L (ref 6–29)
AST: 18 U/L (ref 10–35)
Albumin: 4.1 g/dL (ref 3.6–5.1)
Alkaline phosphatase (APISO): 60 U/L (ref 37–153)
BUN: 12 mg/dL (ref 7–25)
CO2: 27 mmol/L (ref 20–32)
Calcium: 9.2 mg/dL (ref 8.6–10.4)
Chloride: 105 mmol/L (ref 98–110)
Creat: 0.72 mg/dL (ref 0.50–1.05)
Globulin: 2.9 g/dL (calc) (ref 1.9–3.7)
Glucose, Bld: 79 mg/dL (ref 65–99)
Potassium: 4 mmol/L (ref 3.5–5.3)
Sodium: 144 mmol/L (ref 135–146)
Total Bilirubin: 0.6 mg/dL (ref 0.2–1.2)
Total Protein: 7 g/dL (ref 6.1–8.1)
eGFR: 96 mL/min/{1.73_m2} (ref >=60)

## 2022-10-15 LAB — LIPID PANEL
Cholesterol: 183 mg/dL (ref <200)
HDL: 61 mg/dL (ref >=50)
LDL Cholesterol (Calc): 103 mg/dL (calc) — ABNORMAL HIGH
Non-HDL Cholesterol (Calc): 122 mg/dL (calc) (ref <130)
Total CHOL/HDL Ratio: 3 (calc) (ref <5.0)
Triglycerides: 94 mg/dL (ref <150)

## 2022-10-15 LAB — PROTEIN / CREATININE RATIO, URINE
Creatinine, Urine: 328 mg/dL — ABNORMAL HIGH (ref 20–275)
Protein/Creat Ratio: 67 mg/g creat (ref 24–184)
Protein/Creatinine Ratio: 0.067 mg/mg creat (ref 0.024–0.184)
Total Protein, Urine: 22 mg/dL (ref 5–24)

## 2022-10-15 LAB — SEDIMENTATION RATE: Sed Rate: 46 mm/h — ABNORMAL HIGH (ref 0–30)

## 2022-10-15 LAB — C3 AND C4
C3 Complement: 197 mg/dL — ABNORMAL HIGH (ref 83–193)
C4 Complement: 31 mg/dL (ref 15–57)

## 2022-10-15 LAB — ANTI-DNA ANTIBODY, DOUBLE-STRANDED: ds DNA Ab: <1 IU/mL

## 2022-10-15 NOTE — Progress Notes (Signed)
LDL is mildly elevated at 244.  Please forward results to her PCP.

## 2022-10-16 NOTE — Progress Notes (Signed)
White cell count is low.  Hemoglobin is low.  Patient should take multivitamin with iron.  Lymphocyte count is low due to immunosuppression.  CMP is normal.  Sedimentation rate is elevated and stable.  Urine protein creatinine ratio normal.  Complements are normal.  Double-stranded DNA is negative.  Labs do not indicate an autoimmune disease flare.  Please have patient reduce methotrexate to 0.3 mL SQ weekly.  She should have repeat CBC in 6 weeks.  She should also see her PCP for the evaluation of anemia.

## 2022-10-19 ENCOUNTER — Ambulatory Visit: Payer: BC Managed Care – PPO

## 2022-10-19 ENCOUNTER — Other Ambulatory Visit: Payer: Self-pay | Admitting: *Deleted

## 2022-10-19 DIAGNOSIS — M351 Other overlap syndromes: Secondary | ICD-10-CM

## 2022-10-19 DIAGNOSIS — Z79899 Other long term (current) drug therapy: Secondary | ICD-10-CM

## 2022-10-19 DIAGNOSIS — Z7952 Long term (current) use of systemic steroids: Secondary | ICD-10-CM

## 2022-10-21 ENCOUNTER — Encounter: Payer: Self-pay | Admitting: Family Medicine

## 2022-10-21 ENCOUNTER — Ambulatory Visit (INDEPENDENT_AMBULATORY_CARE_PROVIDER_SITE_OTHER): Payer: BC Managed Care – PPO | Admitting: Family Medicine

## 2022-10-21 VITALS — BP 122/80 | HR 60 | Resp 12 | Ht 65.0 in | Wt 217.4 lb

## 2022-10-21 DIAGNOSIS — G5763 Lesion of plantar nerve, bilateral lower limbs: Secondary | ICD-10-CM | POA: Diagnosis not present

## 2022-10-21 DIAGNOSIS — F5101 Primary insomnia: Secondary | ICD-10-CM | POA: Diagnosis not present

## 2022-10-21 DIAGNOSIS — D573 Sickle-cell trait: Secondary | ICD-10-CM

## 2022-10-21 DIAGNOSIS — R7303 Prediabetes: Secondary | ICD-10-CM

## 2022-10-21 DIAGNOSIS — G4700 Insomnia, unspecified: Secondary | ICD-10-CM

## 2022-10-21 DIAGNOSIS — E785 Hyperlipidemia, unspecified: Secondary | ICD-10-CM | POA: Diagnosis not present

## 2022-10-21 DIAGNOSIS — I10 Essential (primary) hypertension: Secondary | ICD-10-CM

## 2022-10-21 LAB — POCT GLYCOSYLATED HEMOGLOBIN (HGB A1C): HbA1c, POC (prediabetic range): 6 % (ref 5.7–6.4)

## 2022-10-21 MED ORDER — ESZOPICLONE 3 MG PO TABS
3.0000 mg | ORAL_TABLET | Freq: Every evening | ORAL | 1 refills | Status: DC | PRN
Start: 2022-10-21 — End: 2023-04-16

## 2022-10-21 NOTE — Patient Instructions (Signed)
A few things to remember from today's visit:  Prediabetes - Plan: POC HgB A1c  Hyperlipidemia, unspecified hyperlipidemia type  Morton neuroma of both feet  Primary insomnia  Insomnia, unspecified type - Plan: eszopiclone 3 MG TABS Resume Lunesta at bedtime and take it as needed. If foot pain continues you can arrange appt with podiatrist. Monitor blood pressure at home.  If you need refills for medications you take chronically, please call your pharmacy. Do not use My Chart to request refills or for acute issues that need immediate attention. If you send a my chart message, it may take a few days to be addressed, specially if I am not in the office.  Please be sure medication list is accurate. If a new problem present, please set up appointment sooner than planned today.

## 2022-10-21 NOTE — Progress Notes (Signed)
HPI: [name] is a 62 y.o. female with a PMHx significant for HTN, HLD, insomnia, asthma, fibromyalgia, OSA, and GERD among others, who is here today for chronic disease management.  Last seen on 09/23/2021. She has seen her rheumatologist since her last visit.   Hypertension:  Medications: She is currently taking hydrochlorothiazide 25 mg daily.  BP readings at home: She has not been checking at home recently.  Side effects: none Negative for unusual or severe headache, visual changes, exertional chest pain, dyspnea,  focal weakness, or edema.  Lab Results  Component Value Date   CREATININE 0.72 10/14/2022   BUN 12 10/14/2022   NA 144 10/14/2022   K 4.0 10/14/2022   CL 105 10/14/2022   CO2 27 10/14/2022   Hyperlipidemia: Currently on Lovastatin 20 mg daily.  She reports she has been eating less fried food. She states she has been eating raw fruit and vegetables.  Side effects from medication: none Lab Results  Component Value Date   CHOL 183 10/14/2022   HDL 61 10/14/2022   LDLCALC 103 (H) 10/14/2022   TRIG 94 10/14/2022   CHOLHDL 3.0 10/14/2022    Insomnia: She is no longer taking eszopiclone 3 mg daily prn. She says she is only sleeping 3-5 hours per night.  Eszopiclone 3 mg helped her sleep, no side effects. Did not continue because she ran out of refills.  Prediabetes: She is not exercising regularly but has been active at work. Not longer on Prednisone. She reports she has been recently craving sugar and eating cookies and cakes. She mentions she has gained ~20 pounds.  Lab Results  Component Value Date   HGBA1C 6.1 03/19/2021   Foot pain: She also reports she has bilateral forefoot pain on the "balls" of her feet and her second, third, and fourth toes. She endorses numbness in those toes.  Problem has been going on for 2-3 months. She says she can't walk normally due to pain. Worse when she walks on hard floors. She just got a "cushing" foot  device that has helped. No hx of trauma, no edema or erythema.  She reports her rheumatologist has decreased dose of her methotrexate to 0.3 mL weekly. According to pt, there has been concerns about CBC results. Lab Results  Component Value Date   WBC 3.2 (L) 10/14/2022   HGB 11.6 (L) 10/14/2022   HCT 36.7 10/14/2022   MCV 91.3 10/14/2022   PLT 315 10/14/2022   She has not noted blood in stool or melena. Hx of sickle cell trait. Reports hx of anemia years ago.  She expresses concern that this will lead to a lupus flare up.  She mentions that she needs to schedule her next eye exam.  Review of Systems  Constitutional:  Positive for fatigue. Negative for activity change, chills and fever.  HENT:  Negative for nosebleeds and sore throat.   Respiratory:  Negative for cough and wheezing.   Gastrointestinal:  Negative for abdominal pain, nausea and vomiting.  Endocrine: Negative for cold intolerance and heat intolerance.  Genitourinary:  Negative for decreased urine volume, dysuria and hematuria.  Skin:  Negative for rash.  Neurological:  Negative for syncope and facial asymmetry.  Psychiatric/Behavioral:  Negative for confusion. The patient is nervous/anxious.   See other pertinent positives and negatives in HPI.  Current Outpatient Medications on File Prior to Visit  Medication Sig Dispense Refill   acetaminophen (TYLENOL) 650 MG CR tablet Take 650 mg by mouth every 8 (  eight) hours as needed for pain.     albuterol (PROVENTIL) (2.5 MG/3ML) 0.083% nebulizer solution Take 3 mLs (2.5 mg total) by nebulization every 6 (six) hours as needed for wheezing or shortness of breath. 75 mL 12   Ascorbic Acid (VITAMIN C PO) Take by mouth.     B-D INS SYR ULTRAFINE 1CC/30G 30G X 1/2" 1 ML MISC      baclofen (LIORESAL) 10 MG tablet TAKE 1 TABLET BY MOUTH DAILY AS NEEDED FOR MUSCLE SPASMS. 90 tablet 0   CALCIUM PO Take by mouth daily.     Cholecalciferol 25 MCG (1000 UT) capsule Take by mouth.      diclofenac sodium (VOLTAREN) 1 % GEL Apply 3 g to 3 large joints up to 3 times daily. (Patient taking differently: Apply 2 g topically daily as needed (pain).) 3 Tube 3   DULoxetine (CYMBALTA) 30 MG capsule Take 30 mg by mouth 2 (two) times daily.     Estradiol 10 MCG TABS vaginal tablet Yuvafem 10 mcg vaginal tablet     famotidine (PEPCID) 20 MG tablet TAKE 1 TO 2 TABLETS BY MOUTH AT BEDTIME 180 tablet 1   fluticasone furoate-vilanterol (BREO ELLIPTA) 100-25 MCG/INH AEPB Inhale 1 puff into the lungs daily. 28 each 0   folic acid (FOLVITE) 1 MG tablet TAKE 2 TABLETS BY MOUTH EVERY DAY 180 tablet 3   hydrochlorothiazide (HYDRODIURIL) 25 MG tablet Take 1 tablet (25 mg total) by mouth daily. Due for follow up. 30 tablet 0   lovastatin (MEVACOR) 20 MG tablet TAKE 1 TABLET BY MOUTH EVERYDAY AT BEDTIME 90 tablet 1   methotrexate 50 MG/2ML injection Inject 0.4 mL into skin once weekly. (Patient taking differently: Inject 0.6 mL into skin once weekly.) 6 mL 0   nystatin-triamcinolone (MYCOLOG II) cream Apply 1 application topically 2 (two) times daily. 60 g 0   Omega-3 Fatty Acids (FISH OIL) 1000 MG CAPS Take by mouth.     ondansetron (ZOFRAN) 4 MG tablet Take 1 tablet (4 mg total) by mouth every 8 (eight) hours as needed for nausea or vomiting. 10 tablet 0   pantoprazole (PROTONIX) 40 MG tablet TAKE 1 TABLET BY MOUTH EVERY DAY BEFORE BREAKFAST 90 tablet 1   potassium chloride SA (KLOR-CON M20) 20 MEQ tablet Take 1 tablet (20 mEq total) by mouth daily. Due for follow up. 30 tablet 0   Tuberculin-Allergy Syringes (ALLERGY SYRINGE 1CC/27GX1/2") 27G X 1/2" 1 ML MISC Patient to use to inject MTX weekly 12 each 3   valACYclovir (VALTREX) 500 MG tablet TAKE 1 TABLET (500 MG TOTAL) BY MOUTH 2 (TWO) TIMES DAILY. FOR 3 DAYS WITH ACUTE EPISODES. (Patient taking differently: TAKE 1 TABLET (500 MG TOTAL) BY MOUTH 2 (TWO) TIMES DAILY.) 18 tablet 1   vitamin B-12 (CYANOCOBALAMIN) 100 MCG tablet Take 100 mcg by mouth  daily.     VITAMIN D, ERGOCALCIFEROL, PO Take 1 capsule by mouth daily.     XELJANZ XR 11 MG TB24 Take 1 tablet by mouth daily.     Zinc Citrate-Phytase (ZYTAZE) 25-500 MG CAPS Take by mouth.     No current facility-administered medications on file prior to visit.    Past Medical History:  Diagnosis Date   Allergy    Anemia    Asthma    Blood transfusion without reported diagnosis    Fibromyalgia    GERD (gastroesophageal reflux disease)    Heart murmur    High cholesterol    History of stomach ulcers  Hypertension    Lupus    MI, old    Moderate obstructive sleep apnea 07/11/2021   Osteoarthritis    Rheumatoid arthritis (HCC)    Thyroid disease    Thyroid mass    Vertigo    Allergies  Allergen Reactions   Aspirin Other (See Comments)    GI bleed   Hydrocodone-Acetaminophen Hives and Nausea And Vomiting   Meperidine Hcl Hives and Nausea And Vomiting    Short term memory loss   Morphine Hives and Nausea And Vomiting   Oxycodone-Acetaminophen Hives and Nausea And Vomiting    Reaction unknown   Ibuprofen Other (See Comments)    GI bleed   Codeine Nausea Only    Other reaction(s): Other (see comments)   Demerol [Meperidine] Nausea Only   Imdur [Isosorbide Nitrate] Other (See Comments)    Reaction unknown   Isosorbide Dinitrate Other (See Comments)    Other reaction(s): Other (see comments) Other reaction(s): Other (See Comments) Reaction unknown Reaction unknown   Procaine Hcl Other (See Comments)    Ineffective   Toprol Xl [Metoprolol] Other (See Comments)    Reaction unknown   Toradol [Ketorolac Tromethamine] Itching   Tramadol Itching    Social History   Socioeconomic History   Marital status: Married    Spouse name: Not on file   Number of children: 4   Years of education: Not on file   Highest education level: Not on file  Occupational History   Occupation: TEACHER ASSISTANT    Employer: [employer]  Tobacco Use   Smoking status:  Never    Passive exposure: Never   Smokeless tobacco: Never  Vaping Use   Vaping status: Never Used  Substance and Sexual Activity   Alcohol use: Yes    Alcohol/week: 1.0 standard drink of alcohol    Types: 1 Glasses of wine per week    Comment: 1 glass every 3 months   Drug use: No   Sexual activity: Yes    Birth control/protection: None, Surgical    Comment: LAVH  Other Topics Concern   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     Social Determinants of Health   Financial Resource Strain: Not on file  Food Insecurity: Not on file  Transportation Needs: Not on file  Physical Activity: Not on file  Stress: Not on file  Social Connections: Not on file   Vitals:   10/21/22 1100  BP: 122/80  Pulse: 60  Resp: 12  SpO2: 97%   Body mass index is 36.17 kg/m.  Physical Exam Vitals and nursing note reviewed.  Constitutional:      General: She is not in acute distress.    Appearance: She is well-developed.  HENT:     Head: Normocephalic and atraumatic.     Mouth/Throat:     Mouth: Mucous membranes are moist.  Eyes:     Conjunctiva/sclera: Conjunctivae normal.  Cardiovascular:     Rate and Rhythm: Normal rate and regular rhythm.     Pulses:          Dorsalis pedis pulses are 2+ on the right side and 2+ on the left side.     Heart sounds: No murmur heard. Pulmonary:     Effort: Pulmonary effort is normal. No respiratory distress.     Breath sounds: Normal breath sounds.  Abdominal:     Palpations: Abdomen is soft. There is  no mass.     Tenderness: There is no abdominal tenderness.  Musculoskeletal:     Right lower leg: No edema.     Left lower leg: No edema.     Right foot: Normal range of motion. Tenderness present. No swelling or deformity.     Left foot: Normal range of motion. Tenderness present. No swelling or deformity.       Feet:  Lymphadenopathy:     Cervical: No cervical  adenopathy.  Skin:    General: Skin is warm.     Findings: No erythema or rash.  Neurological:     General: No focal deficit present.     Mental Status: She is alert and oriented to person, place, and time.     Cranial Nerves: No cranial nerve deficit.     Gait: Gait normal.  Psychiatric:        Mood and Affect: Affect normal. Mood is anxious.    ASSESSMENT AND PLAN:  Ms. Pigeon was seen today for management of chronic issues.   Orders Placed This Encounter  Procedures   POC HgB A1c   Lab Results  Component Value Date   HGBA1C 6.0 10/21/2022   Prediabetes Assessment & Plan: Encouraged consistency with a healthy live style for diabetes prevention. HgA1C went from 6.1 to 6.0.  Orders: -     POCT glycosylated hemoglobin (Hb A1C)  Hyperlipidemia, unspecified hyperlipidemia type Assessment & Plan: Cholesterol has improved. Continue Lovastatin 20 mg daily and low fat diet. F/U in 12 months.  Morton neuroma of both feet We discussed differential Dx. We discussed prognosis and treatment options. For now she is not interested in local steroid injection or other invasive treatment. Continue comfortable shoe,inserts,and avoiding walking barefoot.  Primary insomnia Assessment & Plan: Eszopiclone 3 mg helped, she would like to resume medication. We discussed some side effects. Good sleep hygiene also recommended. F/U in 5-6 months.  Insomnia, unspecified type -     Eszopiclone; Take 1 tablet (3 mg total) by mouth at bedtime as needed. Take immediately before bedtime  Dispense: 30 tablet; Refill: 1  Sickle cell trait (HCC) Assessment & Plan: Recent labs showed normal renal function and minimal anemia. Colonoscopy in 01/2018. She has CBC regularly at her rheumatologist's office.  Primary hypertension Assessment & Plan: BP adequately controlled. Continue HCTZ 25 mg daily and low salt diet. Monitor BP at home. Eye exam is due.  Return in about 6 months (around  04/21/2023).  I, Rolla Etienne Wierda, acting as a scribe for Yanky Vanderburg [place], MD., have documented all relevant documentation on the behalf of Eyan Hagood [place], MD, as directed by  Saraiyah Hemminger [place], MD while in the presence of Deshun Sedivy [place], MD.   I, Minsa Weddington [place], MD, have reviewed all documentation for this visit. The documentation on 10/22/22 for the exam, diagnosis, procedures, and orders are all accurate and complete.  Emberlyn Burlison G. [place], MD  [facility]. [place] office.

## 2022-10-22 NOTE — Assessment & Plan Note (Signed)
Encouraged consistency with a healthy live style for diabetes prevention. HgA1C went from 6.1 to 6.0.

## 2022-10-22 NOTE — Assessment & Plan Note (Signed)
Recent labs showed normal renal function and minimal anemia. Colonoscopy in 01/2018. She has CBC regularly at her rheumatologist's office.

## 2022-10-22 NOTE — Assessment & Plan Note (Signed)
Cholesterol has improved. Continue Lovastatin 20 mg daily and low fat diet. F/U in 12 months.

## 2022-10-22 NOTE — Assessment & Plan Note (Addendum)
Eszopiclone 3 mg helped, she would like to resume medication. We discussed some side effects. Good sleep hygiene also recommended. F/U in 5-6 months.

## 2022-10-22 NOTE — Assessment & Plan Note (Signed)
BP adequately controlled. Continue HCTZ 25 mg daily and low salt diet. Monitor BP at home. Eye exam is due.

## 2022-11-01 ENCOUNTER — Other Ambulatory Visit: Payer: Self-pay | Admitting: Family Medicine

## 2022-11-01 DIAGNOSIS — I10 Essential (primary) hypertension: Secondary | ICD-10-CM

## 2022-11-12 ENCOUNTER — Encounter: Payer: Self-pay | Admitting: Physician Assistant

## 2022-11-12 ENCOUNTER — Ambulatory Visit: Payer: BC Managed Care – PPO | Admitting: Physician Assistant

## 2022-11-12 DIAGNOSIS — M1712 Unilateral primary osteoarthritis, left knee: Secondary | ICD-10-CM | POA: Diagnosis not present

## 2022-11-12 MED ORDER — LIDOCAINE HCL 1 % IJ SOLN
4.0000 mL | INTRAMUSCULAR | Status: AC | PRN
Start: 2022-11-12 — End: 2022-11-12
  Administered 2022-11-12: 4 mL

## 2022-11-12 MED ORDER — METHYLPREDNISOLONE ACETATE 40 MG/ML IJ SUSP
40.0000 mg | INTRAMUSCULAR | Status: AC | PRN
Start: 2022-11-12 — End: 2022-11-12
  Administered 2022-11-12: 40 mg via INTRA_ARTICULAR

## 2022-11-12 NOTE — Progress Notes (Signed)
Office Visit Note   [name]           Date of Birth: 04/06/1960           MRN: [id number] Visit Date: 11/12/2022              Requested by: [place], [name], MD [address] [place],  [place] [address] PCP: [place], [name], MD  Chief Complaint  Patient presents with  . Right Knee - Pain    Right knee pain  injections today   . Left Knee - Pain    Left knee pain  injections today        HPI: Patient is a pleasant 62 year old woman with a history of bilateral knee arthritis.  She does not comes in periodically for steroid injections.  She has had gel injections but not found them particularly effective.  She comes in today complaining that her left knee seem to give out on her when she got out of bed and placed weight on it also very painful.  Denies any calf pain.  Describes her pain is moderate  Assessment & Plan: Visit Diagnoses: Rheumatoid arthritis left knee  Plan: Will go forward with a steroid injection into her left knee.  She does not think she needed was in the right knee.  Also have given her exercises for quadricep strengthening.  May follow-up with me as needed  Follow-Up Instructions: Return if symptoms worsen or fail to improve.   Ortho Exam  Patient is alert, oriented, no adenopathy, well-dressed, normal affect, normal respiratory effort. Examination of her left knee no effusion no erythema compartments are soft and compressible she is neurovascularly intact she does have crepitus in today more pain around the lateral joint line rather than the medial joint line  Imaging: No results found. No images are attached to the encounter.  Labs: Lab Results  Component Value Date   HGBA1C 6.0 10/21/2022   HGBA1C 6.1 03/19/2021   HGBA1C 6.2 08/20/2020   ESRSEDRATE 46 (H) 10/14/2022   ESRSEDRATE 53 (H) 07/08/2022   ESRSEDRATE 31 (H) 09/23/2021   CRP 0.6 10/11/2015   REPTSTATUS 02/10/2013 FINAL 02/08/2013   CULT  02/08/2013    No Beta  Hemolytic Streptococci Isolated Performed at [facility]     Lab Results  Component Value Date   ALBUMIN 4.1 03/19/2022   ALBUMIN 4.3 03/16/2021   ALBUMIN 3.9 05/27/2019    Lab Results  Component Value Date   MG 2.0 06/16/2022   MG 2.0 03/19/2022   MG 1.8 03/16/2021   Lab Results  Component Value Date   VD25OH 53 07/08/2022   VD25OH 44 01/22/2020   VD25OH 36 03/30/2017    No results found for: "PREALBUMIN"    Latest Ref Rng & Units 10/14/2022    2:52 PM 06/16/2022    4:40 PM 03/19/2022   10:20 AM  CBC EXTENDED  WBC 3.8 - 10.8 Thousand/uL 3.2  5.3  3.7   RBC 3.80 - 5.10 Million/uL 4.02  4.31  4.77   Hemoglobin 11.7 - 15.5 g/dL 28.4  13.2  44.0   HCT 35.0 - 45.0 % 36.7  40.8  45.2   Platelets 140 - 400 Thousand/uL 315  311  277   NEUT# 1,500 - 7,800 cells/uL 2,458   2.6   Lymph# 850 - 3,900 cells/uL 483   0.7      There is no height or weight on file to calculate BMI.  Orders:  No orders of the defined types were placed in this encounter.  No orders of the defined types were placed in this encounter.    Procedures: Large Joint Inj: L knee on 11/12/2022 2:42 PM Indications: pain and diagnostic evaluation Details: 25 G 1.5 in needle, anterolateral approach  Arthrogram: No  Medications: 40 mg methylPREDNISolone acetate 40 MG/ML; 4 mL lidocaine 1 % Outcome: tolerated well, no immediate complications Procedure, treatment alternatives, risks and benefits explained, specific risks discussed. Consent was given by the patient.    Clinical Data: No additional findings.  ROS:  All other systems negative, except as noted in the HPI. Review of Systems  Objective: Vital Signs: There were no vitals taken for this visit.  Specialty Comments:  No specialty comments available.  PMFS History: Patient Active Problem List   Diagnosis Date Noted  . Moderate obstructive sleep apnea 07/11/2021  . Daytime sleepiness 03/20/2021  . Prediabetes 03/19/2021  .  Hypokalemia 07/07/2019  . Precordial chest pain 05/04/2019  . Educated about COVID-19 virus infection 05/04/2019  . Persistent vomiting   . Abdominal pain, epigastric   . Nausea & vomiting 03/11/2019  . Gastritis   . Nausea without vomiting 02/28/2019  . Class 2 obesity with body mass index (BMI) of 37.0 to 37.9 in adult 11/25/2018  . Mixed connective tissue disease (HCC) 08/14/2017  . Recurrent genital herpes 05/12/2017  . Shortness of breath 04/15/2017  . Dizziness 02/04/2017  . Situational anxiety 06/08/2016  . Atypical chest pain 06/08/2016  . Trapezius muscle spasm 04/23/2016  . Fibromyalgia 02/13/2016  . 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 Chronic fatigue 02/13/2016  . Vitamin D deficiency 11/25/2015  . Autoimmune disease (HCC) 11/23/2015  . High risk medication use 11/23/2015  . Colon cancer screening 01/25/2014  . Anterior neck pain 12/01/2013  . Inflammatory arthritis 08/18/2012  . Allergic rhinitis 06/23/2012  . HTN (hypertension) 06/23/2012  . Hyperlipidemia 06/23/2012  . Thyroid nodule - benign 06/23/2012  . Arthralgia 06/23/2012  . Mild intermittent asthma   . Sickle cell trait (HCC)   . History of stomach ulcers   . ESOPHAGEAL STRICTURE 10/04/2007  . GERD 10/04/2007  . 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 Dysphagia, pharyngoesophageal phase 10/04/2007   Past Medical History:  Diagnosis Date  . Allergy   . Anemia   . Asthma   . Blood transfusion without reported diagnosis   . Fibromyalgia   . GERD (gastroesophageal reflux disease)   . Heart murmur   . High cholesterol   . History of stomach ulcers   . Hypertension   . Lupus   . MI, old   . Moderate obstructive sleep apnea 07/11/2021  . Osteoarthritis   . Rheumatoid arthritis (HCC)   . Thyroid disease   . Thyroid mass   . Vertigo     Family History  Problem Relation Age of Onset  . Alcohol abuse Father   . Hypertension Father   . Heart disease Father   . Other Mother        tobacco use disorder,  refuses to see a doctor  . Osteoporosis Sister   . Colon cancer Maternal Uncle   . Diabetes Maternal Grandmother   . Healthy Son   . Healthy Daughter   . Multiple sclerosis Daughter   . Colon polyps Neg Hx   . Esophageal cancer Neg Hx   . Kidney disease Neg Hx   . Stomach cancer Neg Hx   . Rectal cancer Neg Hx     Past Surgical  History:  Procedure Laterality Date  . ABDOMINAL HYSTERECTOMY     done for menorrhagia, ovaries remain  . CERVICAL DISCECTOMY     plates and screws in neck  . CESAREAN SECTION    . COLONOSCOPY    . ESOPHAGEAL MANOMETRY N/A 02/12/2014   Procedure: ESOPHAGEAL MANOMETRY (EM);  Surgeon: Louis Meckel, MD;  Location: [facility];  Service: Endoscopy;  Laterality: N/A;  . ESOPHAGOGASTRODUODENOSCOPY  03/04/2011   Procedure: ESOPHAGOGASTRODUODENOSCOPY (EGD);  Surgeon: Louis Meckel, MD;  Location: [facility];  Service: Endoscopy;  Laterality: N/A;  BOTOX Injection  . ESOPHAGOGASTRODUODENOSCOPY (EGD) WITH PROPOFOL N/A 03/12/2019   Procedure: ESOPHAGOGASTRODUODENOSCOPY (EGD) WITH PROPOFOL;  Surgeon: Jeani Hawking, MD;  Location: [facility] ENDOSCOPY;  Service: Endoscopy;  Laterality: N/A;  . exc benign breast lump    . TONSILLECTOMY    . UPPER GASTROINTESTINAL ENDOSCOPY     Social History   Occupational History  . Occupation: Lobbyist: [employer]  Tobacco Use  . Smoking status: Never    Passive exposure: Never  . Smokeless tobacco: Never  Vaping Use  . Vaping status: Never Used  Substance and Sexual Activity  . Alcohol use: Yes    Alcohol/week: 1.0 standard drink of alcohol    Types: 1 Glasses of wine per week    Comment: 1 glass every 3 months  . Drug use: No  . Sexual activity: Yes    Birth control/protection: None, Surgical    Comment: LAVH

## 2022-11-21 ENCOUNTER — Other Ambulatory Visit: Payer: Self-pay | Admitting: Family Medicine

## 2022-11-21 DIAGNOSIS — K219 Gastro-esophageal reflux disease without esophagitis: Secondary | ICD-10-CM

## 2022-11-25 NOTE — Progress Notes (Deleted)
Office Visit Note  [name]             Date of Birth: Jul 05, 1960           MRN: [id number]             PCP: [place], [name], MD Referring: [place], [name], MD Visit Date: 12/09/2022 Occupation: @GUAROCC @  Subjective:    History of Present Illness: Timber VI CLABORN is a 62 y.o. female with history of mixed connective tissue disease and fibromyalgia.  She is taking Xeljanz 11 mg XR by mouth daily, MTX 0.6 ml sq once weekly (reduced dose low WBC count), and folic acid 2 mg daily.   CBC and CMP drawn on 10/14/2022. TB gold negative on 07/08/22. Discussed the importance of holding xeljanz and methotrexate if she develops signs or symptoms of an infection and to resume once the infection has completely cleared.  Lab work from 10/14/2022 was reviewed today in the office: dsDNA negative, C3 197, C4 within normal limits, ESR 46-stable, urine protein creatinine ratio within normal limits.  Activities of Daily Living:  Patient reports morning stiffness for *** {minute/hour:19697}.   Patient {ACTIONS;DENIES/REPORTS:21021675::"Denies"} nocturnal pain.  Difficulty dressing/grooming: {ACTIONS;DENIES/REPORTS:21021675::"Denies"} Difficulty climbing stairs: {ACTIONS;DENIES/REPORTS:21021675::"Denies"} Difficulty getting out of chair: {ACTIONS;DENIES/REPORTS:21021675::"Denies"} Difficulty using hands for taps, buttons, cutlery, and/or writing: {ACTIONS;DENIES/REPORTS:21021675::"Denies"}  No Rheumatology ROS completed.   PMFS History:  Patient Active Problem List   Diagnosis Date Noted   Moderate obstructive sleep apnea 07/11/2021   Daytime sleepiness 03/20/2021   Prediabetes 03/19/2021   Hypokalemia 07/07/2019   Precordial chest pain 05/04/2019   Educated about COVID-19 virus infection 05/04/2019   Persistent vomiting    Abdominal pain, epigastric    Nausea & vomiting 03/11/2019   Gastritis    Nausea without vomiting 02/28/2019   Class 2 obesity with body mass index  (BMI) of 37.0 to 37.9 in adult 11/25/2018   Mixed connective tissue disease (HCC) 08/14/2017   Recurrent genital herpes 05/12/2017   Shortness of breath 04/15/2017   Dizziness 02/04/2017   Situational anxiety 06/08/2016   Atypical chest pain 06/08/2016   Trapezius muscle spasm 04/23/2016   Fibromyalgia 02/13/2016   Primary insomnia 02/13/2016   Chronic fatigue 02/13/2016   Vitamin D deficiency 11/25/2015   Autoimmune disease (HCC) 11/23/2015   High risk medication use 11/23/2015   Colon cancer screening 01/25/2014   Anterior neck pain 12/01/2013   Inflammatory arthritis 08/18/2012   Allergic rhinitis 06/23/2012   HTN (hypertension) 06/23/2012   Hyperlipidemia 06/23/2012   Thyroid nodule - benign 06/23/2012   Arthralgia 06/23/2012   Mild intermittent asthma    Sickle cell trait (HCC)    History of stomach ulcers    ESOPHAGEAL STRICTURE 10/04/2007   GERD 10/04/2007   Atrophic 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   Dysphagia, pharyngoesophageal phase 10/04/2007    Past Medical History:  Diagnosis Date   Allergy    Anemia    Asthma    Blood transfusion without reported diagnosis    Fibromyalgia    GERD (gastroesophageal reflux disease)    Heart murmur    High cholesterol    History of stomach ulcers    Hypertension    Lupus    MI, old    Moderate obstructive sleep apnea 07/11/2021   Osteoarthritis    Rheumatoid arthritis (HCC)    Thyroid disease    Thyroid mass    Vertigo     Family History  Problem Relation Age of Onset   Alcohol abuse Father  Hypertension Father    Heart disease Father    Other Mother        tobacco use disorder, refuses to see a doctor   Osteoporosis Sister    Colon cancer Maternal Uncle    Diabetes Maternal Grandmother    Healthy Son    Healthy Daughter    Multiple sclerosis Daughter    Colon polyps Neg Hx    Esophageal cancer Neg Hx    Kidney disease Neg Hx    Stomach cancer Neg Hx    Rectal cancer Neg Hx    Past Surgical History:  Procedure  Laterality Date   ABDOMINAL HYSTERECTOMY     done for menorrhagia, ovaries remain   CERVICAL DISCECTOMY     plates and screws in neck   CESAREAN SECTION     COLONOSCOPY     ESOPHAGEAL MANOMETRY N/A 02/12/2014   Procedure: ESOPHAGEAL MANOMETRY (EM);  Surgeon: Louis Meckel, MD;  Location: [facility];  Service: Endoscopy;  Laterality: N/A;   ESOPHAGOGASTRODUODENOSCOPY  03/04/2011   Procedure: ESOPHAGOGASTRODUODENOSCOPY (EGD);  Surgeon: Louis Meckel, MD;  Location: [facility];  Service: Endoscopy;  Laterality: N/A;  BOTOX Injection   ESOPHAGOGASTRODUODENOSCOPY (EGD) WITH PROPOFOL N/A 03/12/2019   Procedure: ESOPHAGOGASTRODUODENOSCOPY (EGD) WITH PROPOFOL;  Surgeon: Jeani Hawking, MD;  Location: [facility];  Service: Endoscopy;  Laterality: N/A;   exc benign breast lump     TONSILLECTOMY     UPPER GASTROINTESTINAL ENDOSCOPY     Social History   Social History Narrative   First grade Geologist, 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     Immunization History  Administered Date(s) Administered   Influenza Inj Mdck Quad Pf 10/22/2020   Influenza Split 11/04/2015, 10/11/2017, 10/21/2018   Influenza, Mdck, Trivalent,PF 6+ MOS(egg free) 10/06/2022   Influenza, Quadrivalent, Recombinant, Inj, Pf 10/11/2017   Influenza,inj,Quad PF,6+ Mos 11/04/2015, 10/21/2018, 09/23/2021   Influenza-Unspecified 10/11/2017   PFIZER Comirnaty(Gray Top)Covid-19 Tri-Sucrose Vaccine 05/10/2020   PFIZER(Purple Top)SARS-COV-2 Vaccination 03/20/2019, 04/12/2019, 12/07/2019, 05/10/2020   PNEUMOCOCCAL CONJUGATE-20 10/22/2020   Pfizer(Comirnaty)Fall Seasonal Vaccine 12 years and older 11/16/2021, 10/06/2022   Pneumococcal Polysaccharide-23 01/09/2020   Tdap 08/20/2020   Zoster Recombinant(Shingrix) 10/11/2017, 12/25/2017, 01/09/2020   Zoster, Unspecified 10/11/2017, 12/25/2017     Objective: Vital Signs: There were no vitals taken for this visit.   Physical Exam Vitals and nursing note  reviewed.  Constitutional:      Appearance: She is well-developed.  HENT:     Head: Normocephalic and atraumatic.  Eyes:     Conjunctiva/sclera: Conjunctivae normal.  Cardiovascular:     Rate and Rhythm: Normal rate and regular rhythm.     Heart sounds: Normal heart sounds.  Pulmonary:     Effort: Pulmonary effort is normal.     Breath sounds: Normal breath sounds.  Abdominal:     General: Bowel sounds are normal.     Palpations: Abdomen is soft.  Musculoskeletal:     Cervical back: Normal range of motion.  Lymphadenopathy:     Cervical: No cervical adenopathy.  Skin:    General: Skin is warm and dry.     Capillary Refill: Capillary refill takes less than 2 seconds.  Neurological:     Mental Status: She is alert and oriented to person, place, and time.  Psychiatric:        Behavior: Behavior normal.      Musculoskeletal Exam: ***  CDAI Exam: CDAI Score: -- Patient Global: --; Provider Global: -- Swollen: --; Tender: --  Joint Exam 12/09/2022   No joint exam has been documented for this visit   There is currently no information documented on the homunculus. Go to the Rheumatology activity and complete the homunculus joint exam.  Investigation: No additional findings.  Imaging: No results found.  Recent Labs: Lab Results  Component Value Date   WBC 3.2 (L) 10/14/2022   HGB 11.6 (L) 10/14/2022   PLT 315 10/14/2022   NA 144 10/14/2022   K 4.0 10/14/2022   CL 105 10/14/2022   CO2 27 10/14/2022   GLUCOSE 79 10/14/2022   BUN 12 10/14/2022   CREATININE 0.72 10/14/2022   BILITOT 0.6 10/14/2022   ALKPHOS 69 03/19/2022   AST 18 10/14/2022   ALT 14 10/14/2022   PROT 7.0 10/14/2022   ALBUMIN 4.1 03/19/2022   CALCIUM 9.2 10/14/2022   GFRAA 97 06/24/2020   QFTBGOLDPLUS NEGATIVE 07/08/2022    Speciality Comments: No specialty comments available.  Procedures:  No procedures performed Allergies: Aspirin, Hydrocodone-acetaminophen, Meperidine hcl, Morphine,  Oxycodone-acetaminophen, Ibuprofen, Codeine, Demerol [meperidine], Imdur [isosorbide nitrate], Isosorbide dinitrate, Procaine hcl, Toprol xl [metoprolol], Toradol [ketorolac tromethamine], and Tramadol   Assessment / Plan:     Visit Diagnoses: Mixed connective tissue disease (HCC)  High risk medication use  Long term systemic steroid user  Chronic pain of both shoulders  Trochanteric bursitis of both hips  Primary osteoarthritis of both knees  Fibromyalgia  Primary insomnia  Chronic fatigue  History of vitamin D deficiency  History of hypertension  History of high cholesterol  History of gastroesophageal reflux (GERD)  History of asthma  History of stomach ulcers  OSA (obstructive sleep apnea)  Vitamin D deficiency  Orders: No orders of the defined types were placed in this encounter.  No orders of the defined types were placed in this encounter.   Face-to-face time spent with patient was *** minutes. Greater than 50% of time was spent in counseling and coordination of care.  Follow-Up Instructions: No follow-ups on file.   Gearldine Bienenstock, PA-C  Note - This record has been created using Dragon software.  Chart creation errors have been sought, but may not always  have been located. Such creation errors do not reflect on  the standard of medical care.

## 2022-12-09 ENCOUNTER — Ambulatory Visit: Payer: BC Managed Care – PPO | Admitting: Physician Assistant

## 2022-12-09 DIAGNOSIS — Z7952 Long term (current) use of systemic steroids: Secondary | ICD-10-CM

## 2022-12-09 DIAGNOSIS — M351 Other overlap syndromes: Secondary | ICD-10-CM

## 2022-12-09 DIAGNOSIS — G4733 Obstructive sleep apnea (adult) (pediatric): Secondary | ICD-10-CM

## 2022-12-09 DIAGNOSIS — Z8679 Personal history of other diseases of the circulatory system: Secondary | ICD-10-CM

## 2022-12-09 DIAGNOSIS — M7061 Trochanteric bursitis, right hip: Secondary | ICD-10-CM

## 2022-12-09 DIAGNOSIS — F5101 Primary insomnia: Secondary | ICD-10-CM

## 2022-12-09 DIAGNOSIS — M170 Bilateral primary osteoarthritis of knee: Secondary | ICD-10-CM

## 2022-12-09 DIAGNOSIS — Z8639 Personal history of other endocrine, nutritional and metabolic disease: Secondary | ICD-10-CM

## 2022-12-09 DIAGNOSIS — M797 Fibromyalgia: Secondary | ICD-10-CM

## 2022-12-09 DIAGNOSIS — Z8709 Personal history of other diseases of the respiratory system: Secondary | ICD-10-CM

## 2022-12-09 DIAGNOSIS — R5382 Chronic fatigue, unspecified: Secondary | ICD-10-CM

## 2022-12-09 DIAGNOSIS — G8929 Other chronic pain: Secondary | ICD-10-CM

## 2022-12-09 DIAGNOSIS — Z8719 Personal history of other diseases of the digestive system: Secondary | ICD-10-CM

## 2022-12-09 DIAGNOSIS — Z79899 Other long term (current) drug therapy: Secondary | ICD-10-CM

## 2022-12-09 DIAGNOSIS — E559 Vitamin D deficiency, unspecified: Secondary | ICD-10-CM

## 2022-12-09 DIAGNOSIS — Z8711 Personal history of peptic ulcer disease: Secondary | ICD-10-CM

## 2022-12-19 ENCOUNTER — Encounter: Payer: Self-pay | Admitting: Rheumatology

## 2022-12-21 MED ORDER — XELJANZ XR 11 MG PO TB24: 1.0000 | ORAL_TABLET | Freq: Every day | ORAL | 0 refills | Status: DC

## 2022-12-21 NOTE — Progress Notes (Unsigned)
Office Visit Note  [name]             Date of Birth: 03-Apr-1960           MRN: [id number]             PCP: [place], [name], MD Referring: [place], [name], MD Visit Date: 12/31/2022 Occupation: @GUAROCC @  Subjective:  No chief complaint on file.   History of Present Illness: Patricia Reed is a 62 y.o. female ***     Activities of Daily Living:  Patient reports morning stiffness for *** {minute/hour:19697}.   Patient {ACTIONS;DENIES/REPORTS:21021675::"Denies"} nocturnal pain.  Difficulty dressing/grooming: {ACTIONS;DENIES/REPORTS:21021675::"Denies"} Difficulty climbing stairs: {ACTIONS;DENIES/REPORTS:21021675::"Denies"} Difficulty getting out of chair: {ACTIONS;DENIES/REPORTS:21021675::"Denies"} Difficulty using hands for taps, buttons, cutlery, and/or writing: {ACTIONS;DENIES/REPORTS:21021675::"Denies"}  No Rheumatology ROS completed.   PMFS History:  Patient Active Problem List   Diagnosis Date Noted   Moderate obstructive sleep apnea 07/11/2021   Daytime sleepiness 03/20/2021   Prediabetes 03/19/2021   Hypokalemia 07/07/2019   Precordial chest pain 05/04/2019   Educated about COVID-19 virus infection 05/04/2019   Persistent vomiting    Abdominal pain, epigastric    Nausea & vomiting 03/11/2019   Gastritis    Nausea without vomiting 02/28/2019   Class 2 obesity with body mass index (BMI) of 37.0 to 37.9 in adult 11/25/2018   Mixed connective tissue disease (HCC) 08/14/2017   Recurrent genital herpes 05/12/2017   Shortness of breath 04/15/2017   Dizziness 02/04/2017   Situational anxiety 06/08/2016   Atypical chest pain 06/08/2016   Trapezius muscle spasm 04/23/2016   Fibromyalgia 02/13/2016   Primary insomnia 02/13/2016   Chronic fatigue 02/13/2016   Vitamin D deficiency 11/25/2015   Autoimmune disease (HCC) 11/23/2015   High risk medication use 11/23/2015   Colon cancer screening 01/25/2014   Anterior neck pain 12/01/2013    Inflammatory arthritis 08/18/2012   Allergic rhinitis 06/23/2012   HTN (hypertension) 06/23/2012   Hyperlipidemia 06/23/2012   Thyroid nodule - benign 06/23/2012   Arthralgia 06/23/2012   Mild intermittent asthma    Sickle cell trait (HCC)    History of stomach ulcers    ESOPHAGEAL STRICTURE 10/04/2007   GERD 10/04/2007   Atrophic 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   Dysphagia, pharyngoesophageal phase 10/04/2007    Past Medical History:  Diagnosis Date   Allergy    Anemia    Asthma    Blood transfusion without reported diagnosis    Fibromyalgia    GERD (gastroesophageal reflux disease)    Heart murmur    High cholesterol    History of stomach ulcers    Hypertension    Lupus    MI, old    Moderate obstructive sleep apnea 07/11/2021   Osteoarthritis    Rheumatoid arthritis (HCC)    Thyroid disease    Thyroid mass    Vertigo     Family History  Problem Relation Age of Onset   Alcohol abuse Father    Hypertension Father    Heart disease Father    Other Mother        tobacco use disorder, refuses to see a doctor   Osteoporosis Sister    Colon cancer Maternal Uncle    Diabetes Maternal Grandmother    Healthy Son    Healthy Daughter    Multiple sclerosis Daughter    Colon polyps Neg Hx    Esophageal cancer Neg Hx    Kidney disease Neg Hx    Stomach cancer Neg Hx    Rectal  cancer Neg Hx    Past Surgical History:  Procedure Laterality Date   ABDOMINAL HYSTERECTOMY     done for menorrhagia, ovaries remain   CERVICAL DISCECTOMY     plates and screws in neck   CESAREAN SECTION     COLONOSCOPY     ESOPHAGEAL MANOMETRY N/A 02/12/2014   Procedure: ESOPHAGEAL MANOMETRY (EM);  Surgeon: Louis Meckel, MD;  Location: [facility];  Service: Endoscopy;  Laterality: N/A;   ESOPHAGOGASTRODUODENOSCOPY  03/04/2011   Procedure: ESOPHAGOGASTRODUODENOSCOPY (EGD);  Surgeon: Louis Meckel, MD;  Location: [facility];  Service: Endoscopy;  Laterality: N/A;  BOTOX Injection    ESOPHAGOGASTRODUODENOSCOPY (EGD) WITH PROPOFOL N/A 03/12/2019   Procedure: ESOPHAGOGASTRODUODENOSCOPY (EGD) WITH PROPOFOL;  Surgeon: Jeani Hawking, MD;  Location: [facility] ENDOSCOPY;  Service: Endoscopy;  Laterality: N/A;   exc benign breast lump     TONSILLECTOMY     UPPER GASTROINTESTINAL ENDOSCOPY     Social History   Social History Narrative   First grade Geologist, 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     Immunization History  Administered Date(s) Administered   Influenza Inj Mdck Quad Pf 10/22/2020   Influenza Split 11/04/2015, 10/11/2017, 10/21/2018   Influenza, Mdck, Trivalent,PF 6+ MOS(egg free) 10/06/2022   Influenza, Quadrivalent, Recombinant, Inj, Pf 10/11/2017   Influenza,inj,Quad PF,6+ Mos 11/04/2015, 10/21/2018, 09/23/2021   Influenza-Unspecified 10/11/2017   PFIZER Comirnaty(Gray Top)Covid-19 Tri-Sucrose Vaccine 05/10/2020   PFIZER(Purple Top)SARS-COV-2 Vaccination 03/20/2019, 04/12/2019, 12/07/2019, 05/10/2020   PNEUMOCOCCAL CONJUGATE-20 10/22/2020   Pfizer(Comirnaty)Fall Seasonal Vaccine 12 years and older 11/16/2021, 10/06/2022   Pneumococcal Polysaccharide-23 01/09/2020   Tdap 08/20/2020   Zoster Recombinant(Shingrix) 10/11/2017, 12/25/2017, 01/09/2020   Zoster, Unspecified 10/11/2017, 12/25/2017     Objective: Vital Signs: There were no vitals taken for this visit.   Physical Exam   Musculoskeletal Exam: ***  CDAI Exam: CDAI Score: -- Patient Global: --; Provider Global: -- Swollen: --; Tender: -- Joint Exam 12/31/2022   No joint exam has been documented for this visit   There is currently no information documented on the homunculus. Go to the Rheumatology activity and complete the homunculus joint exam.  Investigation: No additional findings.  Imaging: No results found.  Recent Labs: Lab Results  Component Value Date   WBC 3.2 (L) 10/14/2022   HGB 11.6 (L) 10/14/2022   PLT 315 10/14/2022   NA 144 10/14/2022   K 4.0 10/14/2022    CL 105 10/14/2022   CO2 27 10/14/2022   GLUCOSE 79 10/14/2022   BUN 12 10/14/2022   CREATININE 0.72 10/14/2022   BILITOT 0.6 10/14/2022   ALKPHOS 69 03/19/2022   AST 18 10/14/2022   ALT 14 10/14/2022   PROT 7.0 10/14/2022   ALBUMIN 4.1 03/19/2022   CALCIUM 9.2 10/14/2022   GFRAA 97 06/24/2020   QFTBGOLDPLUS NEGATIVE 07/08/2022    Speciality Comments: No specialty comments available.  Procedures:  No procedures performed Allergies: Aspirin, Hydrocodone-acetaminophen, Meperidine hcl, Morphine, Oxycodone-acetaminophen, Ibuprofen, Codeine, Demerol [meperidine], Imdur [isosorbide nitrate], Isosorbide dinitrate, Procaine hcl, Toprol xl [metoprolol], Toradol [ketorolac tromethamine], and Tramadol   Assessment / Plan:     Visit Diagnoses: No diagnosis found.  Orders: No orders of the defined types were placed in this encounter.  No orders of the defined types were placed in this encounter.   Face-to-face time spent with patient was *** minutes. Greater than 50% of time was spent in counseling and coordination of care.  Follow-Up Instructions: No follow-ups on file.   Ellen Henri, CMA  Note - This record has been created using [employer].  Chart creation errors have been sought, but may not always  have been located. Such creation errors do not reflect on  the standard of medical care.

## 2022-12-21 NOTE — Telephone Encounter (Signed)
Please schedule patient a follow up visit. Patient due November 2024. Thanks!   Follow-Up Instructions: Return in about 5 months (around 12/08/2022) for Mixed connective tissue disease, Fibromyalgia.

## 2022-12-21 NOTE — Telephone Encounter (Signed)
Labs: 10/14/2022 White cell count is low.  Hemoglobin is low. Lymphocyte count is low due to immunosuppression.  CMP is normal.  Sedimentation rate is elevated and stable.   TB Gold: 07/08/2022    Next Visit: Due November 2024. Message sent to the front to schedule.   Last Visit: 07/08/2022  DX:Mixed connective tissue disease   Current Dose per office note 07/08/2022: [name] 11 mg XR by mouth daily   Okay to refill [name]?

## 2022-12-24 ENCOUNTER — Telehealth: Payer: Self-pay | Admitting: Pharmacist

## 2022-12-24 NOTE — Telephone Encounter (Signed)
Received notification from [facility] regarding a prior authorization for [facility]. Authorization has been APPROVED from 12/24/2022 to 12/24/2023. Approval letter sent to scan center.  Patient must fill through [facility]: [phone]  Authorization # [id number]  Received notification from [facility] regarding a prior authorization for [facility]. Authorization has been APPROVED from 12/24/2022 to 12/24/2023. Approval letter sent to scan center.  Authorization # [id number]  Chesley Mires, PharmD, MPH, BCPS, CPP Clinical Pharmacist (Rheumatology and Pulmonology)

## 2022-12-24 NOTE — Telephone Encounter (Signed)
Received fax from [facility] that PA for [name] is required through [facility]. Patient appears to have three insurance plans (?)  Submitted aN URGENT Prior Authorization RENEWAL request to [facility] for [facility] via CoverMyMeds. Will update once we receive a response.  Key: [id number]  Submitted aN URGENT Prior Authorization request RENEWAL to [employer] for XELJANZ via CoverMyMeds. Will update once we receive a response.  Key: [id number]   Chesley Mires, PharmD, MPH, BCPS, CPP Clinical Pharmacist (Rheumatology and Pulmonology)

## 2022-12-31 ENCOUNTER — Ambulatory Visit: Payer: BC Managed Care – PPO | Admitting: Rheumatology

## 2022-12-31 DIAGNOSIS — Z8679 Personal history of other diseases of the circulatory system: Secondary | ICD-10-CM

## 2022-12-31 DIAGNOSIS — E559 Vitamin D deficiency, unspecified: Secondary | ICD-10-CM

## 2022-12-31 DIAGNOSIS — Z8719 Personal history of other diseases of the digestive system: Secondary | ICD-10-CM

## 2022-12-31 DIAGNOSIS — F5101 Primary insomnia: Secondary | ICD-10-CM

## 2022-12-31 DIAGNOSIS — Z8709 Personal history of other diseases of the respiratory system: Secondary | ICD-10-CM

## 2022-12-31 DIAGNOSIS — Z8711 Personal history of peptic ulcer disease: Secondary | ICD-10-CM

## 2022-12-31 DIAGNOSIS — Z79899 Other long term (current) drug therapy: Secondary | ICD-10-CM

## 2022-12-31 DIAGNOSIS — M351 Other overlap syndromes: Secondary | ICD-10-CM

## 2022-12-31 DIAGNOSIS — G4733 Obstructive sleep apnea (adult) (pediatric): Secondary | ICD-10-CM

## 2022-12-31 DIAGNOSIS — Z8639 Personal history of other endocrine, nutritional and metabolic disease: Secondary | ICD-10-CM

## 2022-12-31 DIAGNOSIS — Z13820 Encounter for screening for osteoporosis: Secondary | ICD-10-CM

## 2022-12-31 DIAGNOSIS — M170 Bilateral primary osteoarthritis of knee: Secondary | ICD-10-CM

## 2022-12-31 DIAGNOSIS — M797 Fibromyalgia: Secondary | ICD-10-CM

## 2022-12-31 DIAGNOSIS — R5382 Chronic fatigue, unspecified: Secondary | ICD-10-CM

## 2022-12-31 DIAGNOSIS — Z7952 Long term (current) use of systemic steroids: Secondary | ICD-10-CM

## 2022-12-31 DIAGNOSIS — G8929 Other chronic pain: Secondary | ICD-10-CM

## 2022-12-31 DIAGNOSIS — M7061 Trochanteric bursitis, right hip: Secondary | ICD-10-CM

## 2022-12-31 NOTE — Progress Notes (Deleted)
Office Visit Note  [name]             Date of Birth: 03/13/1960           MRN: [id number]             PCP: Patricia, [name], MD Referring: Patricia, [name], MD Visit Date: 01/11/2023 Occupation: @GUAROCC @  Subjective:  No chief complaint on file.   History of Present Illness: Patricia Reed is a 62 y.o. female ***     Activities of Daily Living:  Patient reports morning stiffness for *** {minute/hour:19697}.   Patient {ACTIONS;DENIES/REPORTS:21021675::"Denies"} nocturnal pain.  Difficulty dressing/grooming: {ACTIONS;DENIES/REPORTS:21021675::"Denies"} Difficulty climbing stairs: {ACTIONS;DENIES/REPORTS:21021675::"Denies"} Difficulty getting out of chair: {ACTIONS;DENIES/REPORTS:21021675::"Denies"} Difficulty using hands for taps, buttons, cutlery, and/or writing: {ACTIONS;DENIES/REPORTS:21021675::"Denies"}  No Rheumatology ROS completed.   PMFS History:  Patient Active Problem List   Diagnosis Date Noted   Moderate obstructive sleep apnea 07/11/2021   Daytime sleepiness 03/20/2021   Prediabetes 03/19/2021   Hypokalemia 07/07/2019   Precordial chest pain 05/04/2019   Educated about COVID-19 virus infection 05/04/2019   Persistent vomiting    Abdominal pain, epigastric    Nausea & vomiting 03/11/2019   Gastritis    Nausea without vomiting 02/28/2019   Class 2 obesity with body mass index (BMI) of 37.0 to 37.9 in adult 11/25/2018   Mixed connective tissue disease (HCC) 08/14/2017   Recurrent genital herpes 05/12/2017   Shortness of breath 04/15/2017   Dizziness 02/04/2017   Situational anxiety 06/08/2016   Atypical chest pain 06/08/2016   Trapezius muscle spasm 04/23/2016   Fibromyalgia 02/13/2016   Primary insomnia 02/13/2016   Chronic fatigue 02/13/2016   Vitamin D deficiency 11/25/2015   Autoimmune disease (HCC) 11/23/2015   High risk medication use 11/23/2015   Colon cancer screening 01/25/2014   Anterior neck pain 12/01/2013    Inflammatory arthritis 08/18/2012   Allergic rhinitis 06/23/2012   HTN (hypertension) 06/23/2012   Hyperlipidemia 06/23/2012   Thyroid nodule - benign 06/23/2012   Arthralgia 06/23/2012   Mild intermittent asthma    Sickle cell trait (HCC)    History of stomach ulcers    ESOPHAGEAL STRICTURE 10/04/2007   GERD 10/04/2007   Atrophic 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   Dysphagia, pharyngoesophageal phase 10/04/2007    Past Medical History:  Diagnosis Date   Allergy    Anemia    Asthma    Blood transfusion without reported diagnosis    Fibromyalgia    GERD (gastroesophageal reflux disease)    Heart murmur    High cholesterol    History of stomach ulcers    Hypertension    Lupus    MI, old    Moderate obstructive sleep apnea 07/11/2021   Osteoarthritis    Rheumatoid arthritis (HCC)    Thyroid disease    Thyroid mass    Vertigo     Family History  Problem Relation Age of Onset   Alcohol abuse Father    Hypertension Father    Heart disease Father    Other Mother        tobacco use disorder, refuses to see a doctor   Osteoporosis Sister    Colon cancer Maternal Uncle    Diabetes Maternal Grandmother    Healthy Son    Healthy Daughter    Multiple sclerosis Daughter    Colon polyps Neg Hx    Esophageal cancer Neg Hx    Kidney disease Neg Hx    Stomach cancer Neg Hx    Rectal  cancer Neg Hx    Past Surgical History:  Procedure Laterality Date   ABDOMINAL HYSTERECTOMY     done for menorrhagia, ovaries remain   CERVICAL DISCECTOMY     plates and screws in neck   CESAREAN SECTION     COLONOSCOPY     ESOPHAGEAL MANOMETRY N/A 02/12/2014   Procedure: ESOPHAGEAL MANOMETRY (EM);  Surgeon: Louis Meckel, MD;  Location: [facility];  Service: Endoscopy;  Laterality: N/A;   ESOPHAGOGASTRODUODENOSCOPY  03/04/2011   Procedure: ESOPHAGOGASTRODUODENOSCOPY (EGD);  Surgeon: Louis Meckel, MD;  Location: [facility];  Service: Endoscopy;  Laterality: N/A;  BOTOX Injection    ESOPHAGOGASTRODUODENOSCOPY (EGD) WITH PROPOFOL N/A 03/12/2019   Procedure: ESOPHAGOGASTRODUODENOSCOPY (EGD) WITH PROPOFOL;  Surgeon: Jeani Hawking, MD;  Location: [facility] ENDOSCOPY;  Service: Endoscopy;  Laterality: N/A;   exc benign breast lump     TONSILLECTOMY     UPPER GASTROINTESTINAL ENDOSCOPY     Social History   Social History Narrative   First grade Geologist, 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     Immunization History  Administered Date(s) Administered   Influenza Inj Mdck Quad Pf 10/22/2020   Influenza Split 11/04/2015, 10/11/2017, 10/21/2018   Influenza, Mdck, Trivalent,PF 6+ MOS(egg free) 10/06/2022   Influenza, Quadrivalent, Recombinant, Inj, Pf 10/11/2017   Influenza,inj,Quad PF,6+ Mos 11/04/2015, 10/21/2018, 09/23/2021   Influenza-Unspecified 10/11/2017   PFIZER Comirnaty(Gray Top)Covid-19 Tri-Sucrose Vaccine 05/10/2020   PFIZER(Purple Top)SARS-COV-2 Vaccination 03/20/2019, 04/12/2019, 12/07/2019, 05/10/2020   PNEUMOCOCCAL CONJUGATE-20 10/22/2020   Pfizer(Comirnaty)Fall Seasonal Vaccine 12 years and older 11/16/2021, 10/06/2022   Pneumococcal Polysaccharide-23 01/09/2020   Tdap 08/20/2020   Zoster Recombinant(Shingrix) 10/11/2017, 12/25/2017, 01/09/2020   Zoster, Unspecified 10/11/2017, 12/25/2017     Objective: Vital Signs: There were no vitals taken for this visit.   Physical Exam   Musculoskeletal Exam: ***  CDAI Exam: CDAI Score: -- Patient Global: --; Provider Global: -- Swollen: --; Tender: -- Joint Exam 01/11/2023   No joint exam has been documented for this visit   There is currently no information documented on the homunculus. Go to the Rheumatology activity and complete the homunculus joint exam.  Investigation: No additional findings.  Imaging: No results found.  Recent Labs: Lab Results  Component Value Date   WBC 3.2 (L) 10/14/2022   HGB 11.6 (L) 10/14/2022   PLT 315 10/14/2022   NA 144 10/14/2022   K 4.0 10/14/2022    CL 105 10/14/2022   CO2 27 10/14/2022   GLUCOSE 79 10/14/2022   BUN 12 10/14/2022   CREATININE 0.72 10/14/2022   BILITOT 0.6 10/14/2022   ALKPHOS 69 03/19/2022   AST 18 10/14/2022   ALT 14 10/14/2022   PROT 7.0 10/14/2022   ALBUMIN 4.1 03/19/2022   CALCIUM 9.2 10/14/2022   GFRAA 97 06/24/2020   QFTBGOLDPLUS NEGATIVE 07/08/2022    Speciality Comments: No specialty comments available.  Procedures:  No procedures performed Allergies: Aspirin, Hydrocodone-acetaminophen, Meperidine hcl, Morphine, Oxycodone-acetaminophen, Ibuprofen, Codeine, Demerol [meperidine], Imdur [isosorbide nitrate], Isosorbide dinitrate, Procaine hcl, Toprol xl [metoprolol], Toradol [ketorolac tromethamine], and Tramadol   Assessment / Plan:     Visit Diagnoses: No diagnosis found.  Orders: No orders of the defined types were placed in this encounter.  No orders of the defined types were placed in this encounter.   Face-to-face time spent with patient was *** minutes. Greater than 50% of time was spent in counseling and coordination of care.  Follow-Up Instructions: No follow-ups on file.   Ellen Henri, CMA  Note - This record has been created using [employer].  Chart creation errors have been sought, but may not always  have been located. Such creation errors do not reflect on  the standard of medical care.

## 2023-01-01 ENCOUNTER — Other Ambulatory Visit: Payer: Self-pay | Admitting: Rheumatology

## 2023-01-01 DIAGNOSIS — M797 Fibromyalgia: Secondary | ICD-10-CM

## 2023-01-01 NOTE — Telephone Encounter (Signed)
Last Fill: 10/05/2022   Next Visit: 12/09/2022   Last Visit: 07/08/2022   Dx: Fibromyalgia    Current Dose per office note on 07/08/2022: baclofen (LIORESAL) 10 MG tablet    Okay to refill Baclofen?

## 2023-01-11 ENCOUNTER — Ambulatory Visit: Payer: BC Managed Care – PPO | Admitting: Rheumatology

## 2023-01-11 DIAGNOSIS — Z8639 Personal history of other endocrine, nutritional and metabolic disease: Secondary | ICD-10-CM

## 2023-01-11 DIAGNOSIS — Z8709 Personal history of other diseases of the respiratory system: Secondary | ICD-10-CM

## 2023-01-11 DIAGNOSIS — Z8711 Personal history of peptic ulcer disease: Secondary | ICD-10-CM

## 2023-01-11 DIAGNOSIS — Z79899 Other long term (current) drug therapy: Secondary | ICD-10-CM

## 2023-01-11 DIAGNOSIS — Z7952 Long term (current) use of systemic steroids: Secondary | ICD-10-CM

## 2023-01-11 DIAGNOSIS — F5101 Primary insomnia: Secondary | ICD-10-CM

## 2023-01-11 DIAGNOSIS — Z8679 Personal history of other diseases of the circulatory system: Secondary | ICD-10-CM

## 2023-01-11 DIAGNOSIS — M7061 Trochanteric bursitis, right hip: Secondary | ICD-10-CM

## 2023-01-11 DIAGNOSIS — E559 Vitamin D deficiency, unspecified: Secondary | ICD-10-CM

## 2023-01-11 DIAGNOSIS — R5382 Chronic fatigue, unspecified: Secondary | ICD-10-CM

## 2023-01-11 DIAGNOSIS — M351 Other overlap syndromes: Secondary | ICD-10-CM

## 2023-01-11 DIAGNOSIS — G4733 Obstructive sleep apnea (adult) (pediatric): Secondary | ICD-10-CM

## 2023-01-11 DIAGNOSIS — M797 Fibromyalgia: Secondary | ICD-10-CM

## 2023-01-11 DIAGNOSIS — Z13820 Encounter for screening for osteoporosis: Secondary | ICD-10-CM

## 2023-01-11 DIAGNOSIS — Z8719 Personal history of other diseases of the digestive system: Secondary | ICD-10-CM

## 2023-01-11 DIAGNOSIS — M170 Bilateral primary osteoarthritis of knee: Secondary | ICD-10-CM

## 2023-01-11 DIAGNOSIS — G8929 Other chronic pain: Secondary | ICD-10-CM

## 2023-01-11 NOTE — Progress Notes (Signed)
 Office Visit Note  [name]             Date of Birth: 1960-11-11           MRN: [id number]             PCP: Jordan, [name], MD Referring: Jordan, [name], MD Visit Date: 01/21/2023 Occupation: @GUAROCC @  Subjective:  Increased pain and discomfort in multiple joints.  History of Present Illness: Patricia Reed is a 62 y.o. female returns today after her last visit on July 08, 2022.  She states she ran out of Xeljanz  about 2 months ago and could not get a refill.  She states finally it is approved by the insurance but she has not received the prescription.  She has been on methotrexate  0.6 mL subcu weekly along with folic acid  2 mg p.o. daily without any interruption.  She states since she has been off Salagen she has been having increased pain and discomfort in multiple joints.  She states all of her joints are painful.  She has been having increased pain and discomfort in her shoulders and her knee joints.  Her hands have been swelling and painful.  She also has pain in the trapezius region.  She states that fibromyalgia is flaring as well.  Patient states that she developed COVID-19 virus infection pneumonia in September 2024.  She is completely recovered from that.  She was given a prednisone  taper when she had pneumonia.    Activities of Daily Living:  Patient reports morning stiffness for 24 hours.   Patient Reports nocturnal pain.  Difficulty dressing/grooming: Reports Difficulty climbing stairs: Reports Difficulty getting out of chair: Reports Difficulty using hands for taps, buttons, cutlery, and/or writing: Reports  Review of Systems  Constitutional:  Positive for fatigue.  HENT:  Positive for mouth dryness. Negative for mouth sores.   Eyes:  Negative for dryness.  Respiratory:  Negative for shortness of breath.   Cardiovascular:  Negative for chest pain and palpitations.  Gastrointestinal:  Positive for constipation. Negative for blood in stool and  diarrhea.  Endocrine: Positive for increased urination.  Genitourinary:  Negative for involuntary urination.  Musculoskeletal:  Positive for joint pain, gait problem, joint pain, joint swelling, myalgias, muscle weakness, morning stiffness, muscle tenderness and myalgias.  Skin:  Positive for rash. Negative for color change, hair loss and sensitivity to sunlight.  Allergic/Immunologic: Positive for susceptible to infections.  Neurological:  Negative for dizziness and headaches.  Hematological:  Positive for swollen glands.  Psychiatric/Behavioral:  Positive for sleep disturbance. Negative for depressed mood. The patient is nervous/anxious.     PMFS History:  Patient Active Problem List   Diagnosis Date Noted   Moderate obstructive sleep apnea 07/11/2021   Daytime sleepiness 03/20/2021   Prediabetes 03/19/2021   Hypokalemia 07/07/2019   Precordial chest pain 05/04/2019   Educated about COVID-19 virus infection 05/04/2019   Persistent vomiting    Abdominal pain, epigastric    Nausea & vomiting 03/11/2019   Gastritis    Nausea without vomiting 02/28/2019   Class 2 obesity with body mass index (BMI) of 37.0 to 37.9 in adult 11/25/2018   Mixed connective tissue disease (HCC) 08/14/2017   Recurrent genital herpes 05/12/2017   Shortness of breath 04/15/2017   Dizziness 02/04/2017   Situational anxiety 06/08/2016   Atypical chest pain 06/08/2016   Trapezius muscle spasm 04/23/2016   Fibromyalgia 02/13/2016   Primary insomnia 02/13/2016   Chronic fatigue 02/13/2016   Vitamin  D deficiency 11/25/2015   Autoimmune disease (HCC) 11/23/2015   High risk medication use 11/23/2015   Colon cancer screening 01/25/2014   Anterior neck pain 12/01/2013   Inflammatory arthritis 08/18/2012   Allergic rhinitis 06/23/2012   HTN (hypertension) 06/23/2012   Hyperlipidemia 06/23/2012   Thyroid  nodule - benign 06/23/2012   Arthralgia 06/23/2012   Mild intermittent asthma    Sickle cell trait (HCC)     History of stomach ulcers    ESOPHAGEAL STRICTURE 10/04/2007   GERD 10/04/2007   Atrophic 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   Dysphagia, pharyngoesophageal phase 10/04/2007    Past Medical History:  Diagnosis Date   Allergy     Anemia    Asthma    Blood transfusion without reported diagnosis    Fibromyalgia    GERD (gastroesophageal reflux disease)    Heart murmur    High cholesterol    History of stomach ulcers    Hypertension    Lupus    MI, old    Moderate obstructive sleep apnea 07/11/2021   Osteoarthritis    Rheumatoid arthritis (HCC)    Thyroid  disease    Thyroid  mass    Vertigo     Family History  Problem Relation Age of Onset   Alcohol abuse Father    Hypertension Father    Heart disease Father    Other Mother        tobacco use disorder, refuses to see a doctor   Osteoporosis Sister    Colon cancer Maternal Uncle    Diabetes Maternal Grandmother    Healthy Son    Healthy Daughter    Multiple sclerosis Daughter    Colon polyps Neg Hx    Esophageal cancer Neg Hx    Kidney disease Neg Hx    Stomach cancer Neg Hx    Rectal cancer Neg Hx    Past Surgical History:  Procedure Laterality Date   ABDOMINAL HYSTERECTOMY     done for menorrhagia, ovaries remain   CERVICAL DISCECTOMY     plates and screws in neck   CESAREAN SECTION     COLONOSCOPY     ESOPHAGEAL MANOMETRY N/A 02/12/2014   Procedure: ESOPHAGEAL MANOMETRY (EM);  Surgeon: Lamar JONETTA Aho, MD;  Location: [facility];  Service: Endoscopy;  Laterality: N/A;   ESOPHAGOGASTRODUODENOSCOPY  03/04/2011   Procedure: ESOPHAGOGASTRODUODENOSCOPY (EGD);  Surgeon: Lamar JONETTA Aho, MD;  Location: [facility];  Service: Endoscopy;  Laterality: N/A;  BOTOX  Injection   ESOPHAGOGASTRODUODENOSCOPY (EGD) WITH PROPOFOL  N/A 03/12/2019   Procedure: ESOPHAGOGASTRODUODENOSCOPY (EGD) WITH PROPOFOL ;  Surgeon: Rollin Dover, MD;  Location: [facility];  Service: Endoscopy;  Laterality: N/A;   exc benign breast lump     TONSILLECTOMY      UPPER GASTROINTESTINAL ENDOSCOPY     Social History   Social History Narrative   First grade geologist, 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     Immunization History  Administered Date(s) Administered   Influenza Inj Mdck Quad Pf 10/22/2020   Influenza Split 11/04/2015, 10/11/2017, 10/21/2018   Influenza, Mdck, Trivalent,PF 6+ MOS(egg free) 10/06/2022   Influenza, Quadrivalent, Recombinant, Inj, Pf 10/11/2017   Influenza,inj,Quad PF,6+ Mos 11/04/2015, 10/21/2018, 09/23/2021   Influenza-Unspecified 10/11/2017   PFIZER Comirnaty(Gray Top)Covid-19 Tri-Sucrose Vaccine 05/10/2020   PFIZER(Purple Top)SARS-COV-2 Vaccination 03/20/2019, 04/12/2019, 12/07/2019, 05/10/2020   PNEUMOCOCCAL CONJUGATE-20 10/22/2020   Pfizer(Comirnaty)Fall Seasonal Vaccine 12 years and older 11/16/2021, 10/06/2022   Pneumococcal Polysaccharide-23 01/09/2020   Tdap 08/20/2020   Zoster Recombinant(Shingrix) 10/11/2017, 12/25/2017, 01/09/2020   Zoster, Unspecified  10/11/2017, 12/25/2017     Objective: Vital Signs: BP 134/85 (BP Location: Left Arm, Patient Position: Sitting, Cuff Size: Normal)   Pulse 73   Resp 14   Ht 5' 5 (1.651 m)   Wt 221 lb (100.2 kg)   BMI 36.78 kg/m    Physical Exam Vitals and nursing note reviewed.  Constitutional:      Appearance: She is well-developed.  HENT:     Head: Normocephalic and atraumatic.  Eyes:     Conjunctiva/sclera: Conjunctivae normal.  Cardiovascular:     Rate and Rhythm: Normal rate and regular rhythm.     Heart sounds: Normal heart sounds.  Pulmonary:     Effort: Pulmonary effort is normal.     Breath sounds: Normal breath sounds.  Abdominal:     General: Bowel sounds are normal.     Palpations: Abdomen is soft.  Musculoskeletal:     Cervical back: Normal range of motion.  Lymphadenopathy:     Cervical: No cervical adenopathy.  Skin:    General: Skin is warm and dry.     Capillary Refill: Capillary refill takes less than 2 seconds.   Neurological:     Mental Status: She is alert and oriented to person, place, and time.  Psychiatric:        Behavior: Behavior normal.      Musculoskeletal Exam: Cervical spine was in good range of motion.  She had discomfort range of motion of her lumbar spine.  She had painful range of motion of her shoulders.  Elbow joints and wrist joints were in good range of motion.  She had bilateral PIP and DIP thickening.  She had tenderness across her MCPs and PIPs.  She had difficulty making a fist due to stiffness.  Hip joints and knee joints were in good range of motion.  Warmth was noted on palpation of her right knee.  There was no tenderness over ankles or MTPs.  CDAI Exam: CDAI Score: -- Patient Global: --; Provider Global: -- Swollen: --; Tender: -- Joint Exam 01/21/2023   No joint exam has been documented for this visit   There is currently no information documented on the homunculus. Go to the Rheumatology activity and complete the homunculus joint exam.  Investigation: No additional findings.  Imaging: No results found.  Recent Labs: Lab Results  Component Value Date   WBC 3.2 (L) 10/14/2022   HGB 11.6 (L) 10/14/2022   PLT 315 10/14/2022   NA 144 10/14/2022   K 4.0 10/14/2022   CL 105 10/14/2022   CO2 27 10/14/2022   GLUCOSE 79 10/14/2022   BUN 12 10/14/2022   CREATININE 0.72 10/14/2022   BILITOT 0.6 10/14/2022   ALKPHOS 69 03/19/2022   AST 18 10/14/2022   ALT 14 10/14/2022   PROT 7.0 10/14/2022   ALBUMIN 4.1 03/19/2022   CALCIUM 9.2 10/14/2022   GFRAA 97 06/24/2020   QFTBGOLDPLUS NEGATIVE 07/08/2022    Speciality Comments: No specialty comments available.  Procedures:  No procedures performed Allergies: Aspirin, Hydrocodone -acetaminophen , Meperidine hcl, Morphine, Oxycodone -acetaminophen , Ibuprofen, Codeine, Demerol [meperidine], Imdur [isosorbide nitrate], Isosorbide dinitrate, Procaine hcl, Toprol xl [metoprolol], Toradol  [ketorolac  tromethamine ], and  Tramadol    Assessment / Plan:     Visit Diagnoses: Mixed connective tissue disease (HCC) - +ANA,+Sm,+RNP,+RF, arthritis: -Patient reports having a flare of mixed connective tissue disease since she has been off Xeljanz  for 2 months.  She states she could not get Xeljanz  due to insurance issues.  She has been taking methotrexate  0.6  mL subcu weekly without any interruption along with folic acid .  She complains of pain and discomfort in all of her joints.  She complains of discomfort in her shoulders, hands and her knees.  She notices swelling in her hands and her knees.  She had tenderness over bilateral PIP joints and also some warmth on palpation of her right knee joint.  She was advised to resume Xeljanz  11 mg XR which has been called into the pharmacy.  Patient was given 1 sample of Xeljanz  11 mg XR.  She will contact the pharmacy to get a prescription.  She will continue methotrexate .  Will obtain labs today.  Plan: Protein / creatinine ratio, urine, Anti-DNA antibody, double-stranded, C3 and C4, Sedimentation rate  High risk medication use - Xeljanz  11 mg XR by mouth daily, MTX 0.6 ml sq once weekly (reduced dose low WBC count), and folic acid  2 mg daily. -In October 2024 labs showed neutropenia and anemia.  She has been taking multivitamin.  Will check labs today.  Plan: COMPLETE METABOLIC PANEL WITH GFR, CBC with Differential/Platelet.  She was advised to get labs every 3 months to monitor for drug toxicity.  Lipid panel on October 14, 2022 showed LDL of 103.  TB Gold was negative on July 08, 2022.  Information about immunization was placed in the AVS.  She was advised to hold methotrexate  and Xeljanz  if she develops an infection resume after the infection resolves.  FDA blackbox warning associated with Xeljanz  regarding major adverse cardiovascular events, thrombosis, serious infection,'s lymphoma was reviewed.  Handout was placed in the AVS.  Long term systemic steroid user - In the past.  Patient  had recent prednisone  taper for COVID-19 virus infection in September 2020 for pneumonia per patient.  Hemoglobin A1c 6.0 on October 21, 2022  Chronic pain of both shoulders-she has been having increased pain and discomfort in her shoulders.  She had good full range of motion.  Trochanteric bursitis of both hips-she continue to have discomfort in bilateral trochanteric bursa.  IT band stretches were discussed.  Primary osteoarthritis of both knees-she has some warmth and outpatient of her right knee.  She has discomfort in the bilateral knee joints.  She believes the recent flare caused increased knee joint discomfort.  Chronic fatigue - Improved on CPAP.  Primary insomnia-she continues to have insomnia and fatigue.  Fibromyalgia -she is having a flare of fibromyalgia with generalized pain and discomfort.  She is on Cymbalta  and baclofen .  History of hypertension-blood pressure was normal today at 134/85.  Medical problems listed as follows:  History of high cholesterol  History of stomach ulcers  History of gastroesophageal reflux (GERD)  History of asthma  Vitamin D  deficiency  OSA (obstructive sleep apnea) - Uses CPAP.  Orders: Orders Placed This Encounter  Procedures   COMPLETE METABOLIC PANEL WITH GFR   CBC with Differential/Platelet   Protein / creatinine ratio, urine   Anti-DNA antibody, double-stranded   C3 and C4   Sedimentation rate   No orders of the defined types were placed in this encounter.    Follow-Up Instructions: Return in about 3 months (around 04/21/2023) for MCTD.   Maya Nash, MD  Note - This record has been created using [employer].  Chart creation errors have been sought, but may not always  have been located. Such creation errors do not reflect on  the standard of medical care.

## 2023-01-21 ENCOUNTER — Encounter: Payer: Self-pay | Admitting: Rheumatology

## 2023-01-21 ENCOUNTER — Ambulatory Visit: Payer: Managed Care, Other (non HMO) | Attending: Rheumatology | Admitting: Rheumatology

## 2023-01-21 ENCOUNTER — Telehealth: Payer: Self-pay | Admitting: *Deleted

## 2023-01-21 VITALS — BP 134/85 | HR 73 | Resp 14 | Ht 65.0 in | Wt 221.0 lb

## 2023-01-21 DIAGNOSIS — Z79899 Other long term (current) drug therapy: Secondary | ICD-10-CM | POA: Diagnosis not present

## 2023-01-21 DIAGNOSIS — G4733 Obstructive sleep apnea (adult) (pediatric): Secondary | ICD-10-CM

## 2023-01-21 DIAGNOSIS — M25511 Pain in right shoulder: Secondary | ICD-10-CM | POA: Diagnosis not present

## 2023-01-21 DIAGNOSIS — F5101 Primary insomnia: Secondary | ICD-10-CM

## 2023-01-21 DIAGNOSIS — Z7952 Long term (current) use of systemic steroids: Secondary | ICD-10-CM

## 2023-01-21 DIAGNOSIS — M797 Fibromyalgia: Secondary | ICD-10-CM

## 2023-01-21 DIAGNOSIS — G8929 Other chronic pain: Secondary | ICD-10-CM

## 2023-01-21 DIAGNOSIS — M351 Other overlap syndromes: Secondary | ICD-10-CM | POA: Diagnosis not present

## 2023-01-21 DIAGNOSIS — Z8639 Personal history of other endocrine, nutritional and metabolic disease: Secondary | ICD-10-CM

## 2023-01-21 DIAGNOSIS — M7061 Trochanteric bursitis, right hip: Secondary | ICD-10-CM

## 2023-01-21 DIAGNOSIS — Z8679 Personal history of other diseases of the circulatory system: Secondary | ICD-10-CM

## 2023-01-21 DIAGNOSIS — E559 Vitamin D deficiency, unspecified: Secondary | ICD-10-CM

## 2023-01-21 DIAGNOSIS — Z8709 Personal history of other diseases of the respiratory system: Secondary | ICD-10-CM

## 2023-01-21 DIAGNOSIS — M170 Bilateral primary osteoarthritis of knee: Secondary | ICD-10-CM

## 2023-01-21 DIAGNOSIS — Z8719 Personal history of other diseases of the digestive system: Secondary | ICD-10-CM

## 2023-01-21 DIAGNOSIS — R5382 Chronic fatigue, unspecified: Secondary | ICD-10-CM

## 2023-01-21 DIAGNOSIS — M25512 Pain in left shoulder: Secondary | ICD-10-CM

## 2023-01-21 DIAGNOSIS — Z8711 Personal history of peptic ulcer disease: Secondary | ICD-10-CM

## 2023-01-21 DIAGNOSIS — M7062 Trochanteric bursitis, left hip: Secondary | ICD-10-CM

## 2023-01-21 NOTE — Telephone Encounter (Signed)
 Medication Samples have been provided to the patient.  Drug name: [name] XR       Strength: 11 mg        Qty: 1  LOT: [id number]  Exp.Date: February 2027  Dosing instructions: Take one tablet by mouth daily.

## 2023-01-21 NOTE — Patient Instructions (Addendum)
 Standing Labs We placed an order today for your standing lab work.   Please have your standing labs drawn in April and every 3 months  Please have your labs drawn 2 weeks prior to your appointment so that the provider can discuss your lab results at your appointment, if possible.  Please note that you may see your imaging and lab results in MyChart before we have reviewed them. We will contact you once all results are reviewed. Please allow our office up to 72 hours to thoroughly review all of the results before contacting the office for clarification of your results.  WALK-IN LAB HOURS  Monday through Thursday from 8:00 am -12:30 pm and 1:00 pm-5:00 pm and Friday from 8:00 am-12:00 pm.  Patients with office visits requiring labs will be seen before walk-in labs.  You may encounter longer than normal wait times. Please allow additional time. Wait times may be shorter on  Monday and Thursday afternoons.  We do not book appointments for walk-in labs. We appreciate your patience and understanding with our staff.   Labs are drawn by [facility]. Please bring your co-pay at the time of your lab draw.  You may receive a bill from [facility] for your lab work.  Please note if you are on Hydroxychloroquine  and and an order has been placed for a Hydroxychloroquine  level,  you will need to have it drawn 4 hours or more after your last dose.  If you wish to have your labs drawn at another location, please call the office 24 hours in advance so we can fax the orders.  The office is located at [address], [address], [place], [place] [address]   If you have any questions regarding directions or hours of operation,  please call [phone].   As a reminder, please drink plenty of water prior to coming for your lab work. Thanks!   Vaccines You are taking a medication(s) that can suppress your immune system.  The following immunizations are recommended: Flu annually Covid-19  RSV Td/Tdap (tetanus,  diphtheria, pertussis) every 10 years Pneumonia (Prevnar 15 then Pneumovax 23 at least 1 year apart.  Alternatively, can take Prevnar 20 without needing additional dose) Shingrix: 2 doses from 4 weeks to 6 months apart  Please check with your PCP to make sure you are up to date.   If you have signs or symptoms of an infection or start antibiotics: First, call your PCP for workup of your infection. Hold your medication through the infection, until you complete your antibiotics, and until symptoms resolve if you take the following: Injectable medication (Actemra, Benlysta, Cimzia, Cosentyx, Enbrel, Humira, Kevzara, Orencia, Remicade, Simponi, Stelara, Taltz, Tremfya) Methotrexate  Leflunomide (Arava) Mycophenolate (Cellcept) Xeljanz , Olumiant, or Rinvoq   Because you are taking Xeljanz , Rinvoq, or Olumiant, it is very important to know that this class of medications has a FDA BLACK BOX WARNING for major adverse cardiovascular events (MACE), thrombosis, mortality (including sudden cardiovascular death), serious infections, and lymphomas. MACE is defined as cardiovascular death, myocardial infarction, and stroke. Thrombosis includes deep venous thrombosis (DVT), pulmonary embolism (PE), and arterial thrombosis. If you are a current or former smoker, you are at higher risk for MACE.

## 2023-01-22 LAB — COMPLETE METABOLIC PANEL WITH GFR
AG Ratio: 1.2 (calc) (ref 1.0–2.5)
ALT: 18 U/L (ref 6–29)
AST: 22 U/L (ref 10–35)
Albumin: 4.2 g/dL (ref 3.6–5.1)
Alkaline phosphatase (APISO): 68 U/L (ref 37–153)
BUN: 12 mg/dL (ref 7–25)
CO2: 29 mmol/L (ref 20–32)
Calcium: 9.6 mg/dL (ref 8.6–10.4)
Chloride: 103 mmol/L (ref 98–110)
Creat: 0.71 mg/dL (ref 0.50–1.05)
Globulin: 3.4 g/dL (ref 1.9–3.7)
Glucose, Bld: 94 mg/dL (ref 65–99)
Potassium: 3.7 mmol/L (ref 3.5–5.3)
Sodium: 142 mmol/L (ref 135–146)
Total Bilirubin: 0.5 mg/dL (ref 0.2–1.2)
Total Protein: 7.6 g/dL (ref 6.1–8.1)
eGFR: 97 mL/min/{1.73_m2} (ref >=60)

## 2023-01-22 LAB — CBC WITH DIFFERENTIAL/PLATELET
Absolute Lymphocytes: 589 {cells}/uL — ABNORMAL LOW (ref 850–3900)
Absolute Monocytes: 251 {cells}/uL (ref 200–950)
Basophils Absolute: 30 {cells}/uL (ref 0–200)
Basophils Relative: 0.8 %
Eosinophils Absolute: 49 {cells}/uL (ref 15–500)
Eosinophils Relative: 1.3 %
HCT: 39.6 % (ref 35.0–45.0)
Hemoglobin: 12.8 g/dL (ref 11.7–15.5)
MCH: 28.8 pg (ref 27.0–33.0)
MCHC: 32.3 g/dL (ref 32.0–36.0)
MCV: 89 fL (ref 80.0–100.0)
MPV: 10.9 fL (ref 7.5–12.5)
Monocytes Relative: 6.6 %
Neutro Abs: 2880 {cells}/uL (ref 1500–7800)
Neutrophils Relative %: 75.8 %
Platelets: 319 10*3/uL (ref 140–400)
RBC: 4.45 10*6/uL (ref 3.80–5.10)
RDW: 13.8 % (ref 11.0–15.0)
Total Lymphocyte: 15.5 %
WBC: 3.8 10*3/uL (ref 3.8–10.8)

## 2023-01-22 LAB — C3 AND C4
C3 Complement: 213 mg/dL — ABNORMAL HIGH (ref 83–193)
C4 Complement: 31 mg/dL (ref 15–57)

## 2023-01-22 LAB — PROTEIN / CREATININE RATIO, URINE
Creatinine, Urine: 232 mg/dL (ref 20–275)
Protein/Creat Ratio: 60 mg/g{creat} (ref 24–184)
Protein/Creatinine Ratio: 0.06 mg/mg{creat} (ref 0.024–0.184)
Total Protein, Urine: 14 mg/dL (ref 5–24)

## 2023-01-22 LAB — ANTI-DNA ANTIBODY, DOUBLE-STRANDED: ds DNA Ab: 1 [IU]/mL

## 2023-01-22 LAB — SEDIMENTATION RATE: Sed Rate: 41 mm/h — ABNORMAL HIGH (ref 0–30)

## 2023-01-24 NOTE — Progress Notes (Signed)
 Sed rate is elevated at 41 and stable.  CBC shows low lymphocyte count due to immunosuppression.  CMP normal.  Urine protein normal.  Double-stranded DNA negative.  Complements are stable.  Labs do not indicate an autoimmune disease flare

## 2023-01-26 ENCOUNTER — Telehealth: Payer: Self-pay | Admitting: *Deleted

## 2023-01-26 MED ORDER — XELJANZ XR 11 MG PO TB24: 1.0000 | ORAL_TABLET | Freq: Every day | ORAL | 0 refills | Status: DC

## 2023-01-26 NOTE — Telephone Encounter (Signed)
 Submitted a Prior Authorization request to [facility] for XELJANZ  via CoverMyMeds. Will update once we receive a response.  Key: [id number]  Per automated response: Your PA has been resolved, no additional PA is required.   Auth was already approved on 12/24/2022 through [facility] Plan  Through [facility], patient MUST fill through [facility]. Will await any follow-up if needed  Sherry Pennant, PharmD, MPH, BCPS, CPP Clinical Pharmacist (Rheumatology and Pulmonology)

## 2023-01-26 NOTE — Telephone Encounter (Signed)
 Patient contacted the office and left message stating she reached out [facility] to set up shipment of her Xeljanz . Patient states with the change in her insurance she now has to use [facility]. Requesting prescription to be sent to [facility].   Attempted to contact the patient and left message to advise patient prescription sent to [facility].

## 2023-01-29 ENCOUNTER — Encounter: Payer: Self-pay | Admitting: *Deleted

## 2023-02-05 NOTE — Telephone Encounter (Signed)
Received notification from [employer] regarding a prior authorization for [facility]. Authorization has been APPROVED from 01/20/2023 to 02/05/2024.   Authorization # [id number]  Chesley Mires, PharmD, MPH, BCPS, CPP Clinical Pharmacist (Rheumatology and Pulmonology)

## 2023-02-05 NOTE — Telephone Encounter (Signed)
Submitted a Prior Authorization request to [employer] for CIMZIA via CoverMyMeds. Will update once we receive a response.  Key: [id number]

## 2023-03-04 ENCOUNTER — Other Ambulatory Visit: Payer: Self-pay | Admitting: Rheumatology

## 2023-03-04 DIAGNOSIS — M797 Fibromyalgia: Secondary | ICD-10-CM

## 2023-03-04 NOTE — Telephone Encounter (Signed)
Last Fill: 01/01/2023  Next Visit: 04/22/2023  Last Visit: 01/21/2023  Dx: Fibromyalgia   Current Dose per office note on 01/21/2023: She is on Cymbalta and baclofen.   Okay to refill Baclofen?

## 2023-03-10 ENCOUNTER — Encounter: Payer: Self-pay | Admitting: Family Medicine

## 2023-03-10 ENCOUNTER — Encounter: Payer: Self-pay | Admitting: Rheumatology

## 2023-03-10 ENCOUNTER — Other Ambulatory Visit: Payer: Self-pay | Admitting: Rheumatology

## 2023-03-10 DIAGNOSIS — A6000 Herpesviral infection of urogenital system, unspecified: Secondary | ICD-10-CM

## 2023-03-10 MED ORDER — XELJANZ XR 11 MG PO TB24: 1.0000 | ORAL_TABLET | Freq: Every day | ORAL | 0 refills | Status: DC

## 2023-03-10 NOTE — Telephone Encounter (Signed)
Labs: 01/21/2023 Sed rate is elevated at 41 and stable.  CBC shows low lymphocyte count due to immunosuppression.  CMP normal.  Urine protein normal.  Double-stranded DNA negative.  Complements are stable.  Labs do not indicate an autoimmune disease flare   TB Gold: 07/08/2022 Neg    Next Visit: 04/22/2023  Last Visit: 01/21/2023  DX:Mixed connective tissue disease   Current Dose per office note 01/21/2023: Patricia Reed 11 mg XR by mouth daily   Okay to refill Patricia Reed?

## 2023-03-15 MED ORDER — VALACYCLOVIR HCL 500 MG PO TABS: ORAL_TABLET | ORAL | 1 refills | Status: DC

## 2023-03-15 MED ORDER — POTASSIUM CHLORIDE CRYS ER 20 MEQ PO TBCR: 20.0000 meq | EXTENDED_RELEASE_TABLET | Freq: Every day | ORAL | 2 refills | Status: DC

## 2023-04-08 NOTE — Progress Notes (Unsigned)
 Office Visit Note  [name]             Date of Birth: October 17, 1960           MRN: [id number]             PCP: [place], [name], MD Referring: [place], [name], MD Visit Date: 04/22/2023 Occupation: @GUAROCC @  Subjective:  Epigastric pain   History of Present Illness: Patricia Reed is a 63 y.o. female with history of mixed connective tissue disease.  Patient remains on  Xeljanz 11 mg XR by mouth daily, MTX 0.3 ml sq once weekly (reduced dose low WBC count), and folic acid 2 mg daily.  She is tolerating combination therapy without any side effects.  Patient reports that she was off of [name] for a few months due to issues with insurance coverage with the new year.  Patient states that she resumed [place] in March 2025.  She continues to find the combination of medications to be effective at managing her symptoms.  She denies any joint swelling at this time.  Patient states that her primary concern has been epigastric pain and blood in her stool.  Patient is currently awaiting appointment with GI to schedule an endoscopy.    Activities of Daily Living:  Patient reports morning stiffness for 4.5 hours.   Patient Reports nocturnal pain.  Difficulty dressing/grooming: Denies Difficulty climbing stairs: Denies Difficulty getting out of chair: Denies Difficulty using hands for taps, buttons, cutlery, and/or writing: Reports  Review of Systems  Constitutional:  Positive for fatigue.  HENT:  Positive for mouth dryness. Negative for mouth sores.   Eyes:  Negative for dryness.  Respiratory:  Negative for shortness of breath.   Cardiovascular:  Negative for chest pain and palpitations.  Gastrointestinal:  Positive for blood in stool and constipation. Negative for diarrhea.  Endocrine: Negative for increased urination.  Genitourinary:  Negative for involuntary urination.  Musculoskeletal:  Positive for joint pain, gait problem, joint pain, myalgias, morning stiffness and  myalgias. Negative for joint swelling, muscle weakness and muscle tenderness.  Skin:  Positive for hair loss and sensitivity to sunlight. Negative for color change and rash.  Allergic/Immunologic: Positive for susceptible to infections.  Neurological:  Positive for dizziness and headaches.  Hematological:  Negative for swollen glands.  Psychiatric/Behavioral:  Positive for depressed mood and sleep disturbance. The patient is nervous/anxious.     PMFS History:  Patient Active Problem List   Diagnosis Date Noted   Moderate obstructive sleep apnea 07/11/2021   Daytime sleepiness 03/20/2021   Prediabetes 03/19/2021   Hypokalemia 07/07/2019   Precordial chest pain 05/04/2019   Educated about COVID-19 virus infection 05/04/2019   Persistent vomiting    Abdominal pain, epigastric    Nausea & vomiting 03/11/2019   Gastritis    Nausea without vomiting 02/28/2019   Class 2 obesity with body mass index (BMI) of 37.0 to 37.9 in adult 11/25/2018   Mixed connective tissue disease (HCC) 08/14/2017   Recurrent genital herpes 05/12/2017   Shortness of breath 04/15/2017   Dizziness 02/04/2017   Situational anxiety 06/08/2016   Atypical chest pain 06/08/2016   Trapezius muscle spasm 04/23/2016   Fibromyalgia 02/13/2016   Insomnia 02/13/2016   Chronic fatigue 02/13/2016   Vitamin D deficiency 11/25/2015   Autoimmune disease (HCC) 11/23/2015   High risk medication use 11/23/2015   Colon cancer screening 01/25/2014   Anterior neck pain 12/01/2013   Inflammatory arthritis 08/18/2012   Allergic rhinitis 06/23/2012  HTN (hypertension) 06/23/2012   Hyperlipidemia 06/23/2012   Thyroid nodule - benign 06/23/2012   Arthralgia 06/23/2012   Mild intermittent asthma    Sickle cell trait (HCC)    History of stomach ulcers    ESOPHAGEAL STRICTURE 10/04/2007   GERD 10/04/2007   Atrophic 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   Dysphagia, pharyngoesophageal phase 10/04/2007    Past Medical History:  Diagnosis Date    Allergy    Anemia    Asthma    Blood transfusion without reported diagnosis    Fibromyalgia    GERD (gastroesophageal reflux disease)    Heart murmur    High cholesterol    History of stomach ulcers    Hypertension    Lupus    MI, old    Moderate obstructive sleep apnea 07/11/2021   Osteoarthritis    Rheumatoid arthritis (HCC)    Stomach ulcer    Thyroid disease    Thyroid mass    Vertigo     Family History  Problem Relation Age of Onset   Alcohol abuse Father    Hypertension Father    Heart disease Father    Other Mother        tobacco use disorder, refuses to see a doctor   Osteoporosis Sister    Colon cancer Maternal Uncle    Diabetes Maternal Grandmother    Healthy Son    Healthy Daughter    Multiple sclerosis Daughter    Colon polyps Neg Hx    Esophageal cancer Neg Hx    Kidney disease Neg Hx    Stomach cancer Neg Hx    Rectal cancer Neg Hx    Past Surgical History:  Procedure Laterality Date   ABDOMINAL HYSTERECTOMY     done for menorrhagia, ovaries remain   CERVICAL DISCECTOMY     plates and screws in neck   CESAREAN SECTION     COLONOSCOPY     ESOPHAGEAL MANOMETRY N/A 02/12/2014   Procedure: ESOPHAGEAL MANOMETRY (EM);  Surgeon: Louis Meckel, MD;  Location: [facility] ENDOSCOPY;  Service: Endoscopy;  Laterality: N/A;   ESOPHAGOGASTRODUODENOSCOPY  03/04/2011   Procedure: ESOPHAGOGASTRODUODENOSCOPY (EGD);  Surgeon: Louis Meckel, MD;  Location: [facility];  Service: Endoscopy;  Laterality: N/A;  BOTOX Injection   ESOPHAGOGASTRODUODENOSCOPY (EGD) WITH PROPOFOL N/A 03/12/2019   Procedure: ESOPHAGOGASTRODUODENOSCOPY (EGD) WITH PROPOFOL;  Surgeon: Jeani Hawking, MD;  Location: [facility] ENDOSCOPY;  Service: Endoscopy;  Laterality: N/A;   exc benign breast lump     TONSILLECTOMY     UPPER GASTROINTESTINAL ENDOSCOPY     Social History   Social History Narrative   First grade Geologist, 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     Immunization History   Administered Date(s) Administered   Influenza Inj Mdck Quad Pf 10/22/2020   Influenza Split 11/04/2015, 10/11/2017, 10/21/2018   Influenza, Mdck, Trivalent,PF 6+ MOS(egg free) 10/06/2022   Influenza, Quadrivalent, Recombinant, Inj, Pf 10/11/2017   Influenza,inj,Quad PF,6+ Mos 11/04/2015, 10/21/2018, 09/23/2021   Influenza-Unspecified 10/11/2017   PFIZER Comirnaty(Gray Top)Covid-19 Tri-Sucrose Vaccine 05/10/2020   PFIZER(Purple Top)SARS-COV-2 Vaccination 03/20/2019, 04/12/2019, 12/07/2019, 05/10/2020   PNEUMOCOCCAL CONJUGATE-20 10/22/2020   Pfizer(Comirnaty)Fall Seasonal Vaccine 12 years and older 11/16/2021, 10/06/2022   Pneumococcal Polysaccharide-23 01/09/2020   Tdap 08/20/2020   Zoster Recombinant(Shingrix) 10/11/2017, 12/25/2017, 01/09/2020   Zoster, Unspecified 10/11/2017, 12/25/2017     Objective: Vital Signs: BP 126/88 (BP Location: Left Arm, Patient Position: Sitting, Cuff Size: Large)   Pulse 62   Resp 14   Ht 5\' 5"  (1.651  m)   Wt 221 lb (100.2 kg)   BMI 36.78 kg/m    Physical Exam Vitals and nursing note reviewed.  Constitutional:      Appearance: She is well-developed.  HENT:     Head: Normocephalic and atraumatic.  Eyes:     Conjunctiva/sclera: Conjunctivae normal.  Cardiovascular:     Rate and Rhythm: Normal rate and regular rhythm.     Heart sounds: Normal heart sounds.  Pulmonary:     Effort: Pulmonary effort is normal.     Breath sounds: Normal breath sounds.  Abdominal:     General: Bowel sounds are normal.     Palpations: Abdomen is soft.  Musculoskeletal:     Cervical back: Normal range of motion.  Lymphadenopathy:     Cervical: No cervical adenopathy.  Skin:    General: Skin is warm and dry.     Capillary Refill: Capillary refill takes less than 2 seconds.  Neurological:     Mental Status: She is alert and oriented to person, place, and time.  Psychiatric:        Behavior: Behavior normal.      Musculoskeletal Exam: C-spine has good range  of motion.  Shoulder joints have good range of motion.  Elbow joints, wrist joints, MCPs, PIPs, DIPs have good range of motion with no synovitis.  PIP and DIP thickening noted.  Complete fist formation bilaterally.  Hip joints have good range of motion with no groin pain.  Knee joints have good range of motion with no warmth or effusion.  Ankle joints have good range of motion without tenderness or joint swelling.  CDAI Exam: CDAI Score: -- Patient Global: --; Provider Global: -- Swollen: --; Tender: -- Joint Exam 04/22/2023   No joint exam has been documented for this visit   There is currently no information documented on the homunculus. Go to the Rheumatology activity and complete the homunculus joint exam.  Investigation: No additional findings.  Imaging: CT ABDOMEN PELVIS W CONTRAST Result Date: 04/10/2023 CLINICAL DATA:  Abdominal pain.  Anemia. EXAM: CT ABDOMEN AND PELVIS WITH CONTRAST TECHNIQUE: Multidetector CT imaging of the abdomen and pelvis was performed using the standard protocol following bolus administration of intravenous contrast. RADIATION DOSE REDUCTION: This exam was performed according to the departmental dose-optimization program which includes automated exposure control, adjustment of the mA and/or kV according to patient size and/or use of iterative reconstruction technique. CONTRAST:  OMNIPAQUE IOHEXOL 300 MG/ML  SOLN COMPARISON:  Chest CTA on 08/19/2021 FINDINGS: Lower Chest: No acute findings. Hepatobiliary: A small cyst is seen in the posterior right hepatic lobe. A 1.7 cm homogeneous hypervascular lesion is seen in the left lobe adjacent to the falciform ligament. This is stable since prior chest CTA in 2023, consistent with benign etiology. No new or enlarging liver lesions identified. Gallbladder is unremarkable. No evidence of biliary ductal dilatation. Pancreas:  No mass or inflammatory changes. Spleen: Within normal limits in size and appearance.  Adrenals/Urinary Tract: No suspicious masses identified. No evidence of ureteral calculi or hydronephrosis. Stomach/Bowel: No evidence of obstruction, inflammatory process or abnormal fluid collections. Mild diverticulosis of descending colon noted, without signs of diverticulitis. Vascular/Lymphatic: No pathologically enlarged lymph nodes. No acute vascular findings. Reproductive: Prior hysterectomy noted. Adnexal regions are unremarkable in appearance. Other:  None. Musculoskeletal:  No suspicious bone lesions identified. IMPRESSION: No acute findings. Mild colonic diverticulosis, without radiographic evidence of diverticulitis. Stable 1.7 cm hypervascular lesion in left hepatic lobe, consistent with benign etiology. Possibilities include  flash-filling hemangioma, focal nodular hyperplasia, and adenoma. Electronically Signed   By: Danae Orleans M.D.   On: 04/10/2023 09:25    Recent Labs: Lab Results  Component Value Date   WBC 3.6 (L) 04/10/2023   HGB 12.0 04/10/2023   PLT 270 04/10/2023   NA 139 04/10/2023   K 3.4 (L) 04/10/2023   CL 104 04/10/2023   CO2 27 04/10/2023   GLUCOSE 108 (H) 04/10/2023   BUN 12 04/10/2023   CREATININE 0.69 04/10/2023   BILITOT 0.8 04/10/2023   ALKPHOS 47 04/10/2023   AST 17 04/10/2023   ALT 13 04/10/2023   PROT 7.9 04/10/2023   ALBUMIN 3.5 04/10/2023   CALCIUM 8.8 (L) 04/10/2023   GFRAA 97 06/24/2020   QFTBGOLDPLUS NEGATIVE 07/08/2022    Speciality Comments: No specialty comments available.  Procedures:  No procedures performed Allergies: Aspirin, Hydrocodone-acetaminophen, Meperidine hcl, Morphine, Oxycodone-acetaminophen, Ibuprofen, Codeine, Demerol [meperidine], Imdur [isosorbide nitrate], Isosorbide dinitrate, Procaine hcl, Toprol xl [metoprolol], Toradol [ketorolac tromethamine], and Tramadol      Assessment / Plan:     Visit Diagnoses: Mixed connective tissue disease (HCC) - +ANA,+Sm,+RNP,+RF, arthritis: She has not had any signs or symptoms of  a flare.  She is clinically doing well taking Xeljanz 11 mg XR daily and methotrexate 0.3 mL sq injections once weekly.  She has no synovitis on examination today.  Patient had a gap in therapy for 2 to 3 months due to issues with insurance but has resumed [name] as prescribed.  While off of [name] she noticed increased joint pain, stiffness, and inflammation which has since subsided.  She had no active inflammation on examination today. Lab work from 04/09/23 was reviewed today: ANA remains positive, dsDNA negative, no proteinuria, C3 197, C4 WNL.  She will require updated lab work in June and every 3 months.  She will remain on Xeljanz, methotrexate, and folic acid as prescribed.  She was advised to notify us if she develops any new or worsening symptoms. She will follow up in 5 months or sooner if needed.   High risk medication use - Xeljanz 11 mg XR by mouth daily, MTX 0.3 ml sq once weekly (reduced dose low WBC count), and folic acid 2 mg daily. CBC and CMP updated on 04/10/23. Her next lab work will be due in June and every 3 months.  TB gold negative on 07/08/22. Future order for TB gold placed today.  Lipid panel 10/14/22.  Hgb A1c 10/21/22.   - Plan: CBC with Differential/Platelet, Comprehensive metabolic panel with GFR, QuantiFERON-TB Gold Plus  Screening for tuberculosis - Future order for TB gold placed. Plan: QuantiFERON-TB Gold Plus  Long term systemic steroid user: No longer taking prednisone.   Chronic pain of both shoulders: She has good range of motion of both shoulder joints on examination today.  Trochanteric bursitis of both hips: Intermittent discomfort.  Good range of motion of both hip joints with no groin pain at this time.  Primary osteoarthritis of both knees: Good range of motion of both knee joints on examination today.  No warmth or effusion noted.  Chronic fatigue: Chronic, stable.  Primary insomnia: She experiences interrupted sleep at night due to nocturnal pain  intermittently.  Fibromyalgia: Patient is not currently experiencing a fibromyalgia flare.  She experiences intermittent myalgias and muscle tenderness due to fibromyalgia.  Discussed the importance of regular exercise and good sleep hygiene.  History of hypertension: Blood pressure was 126/88 today in the office.  History of high cholesterol: Total  cholesterol is 183, HDL was 61, triglycerides 94, LDL 103 on 10/14/2022.  History of stomach ulcers: Patient presented to the emergency department on 04/10/2023 with epigastric pain.  She had a CT abdomen and pelvis on 04/10/2023 with no acute findings and was given fentanyl for pain relief.  She has noticed black tarry stools as well as ongoing discomfort in the epigastric region.  She followed up with her PCP on 04/16/2023 and was advised to increase Protonix to 40 mg twice daily.  She is currently awaiting appointment with GI for further evaluation and to schedule an endoscopy.  Other medical conditions are listed as follows:  History of gastroesophageal reflux (GERD)  History of asthma  Vitamin D deficiency: She is taking vitamin D 1000 units daily.  OSA (obstructive sleep apnea)    Orders: Orders Placed This Encounter  Procedures   CBC with Differential/Platelet   Comprehensive metabolic panel with GFR   QuantiFERON-TB Gold Plus   No orders of the defined types were placed in this encounter.     Follow-Up Instructions: Return in about 3 months (around 07/22/2023) for MCTD.   Gearldine Bienenstock, PA-C  Note - This record has been created using Dragon software.  Chart creation errors have been sought, but may not always  have been located. Such creation errors do not reflect on  the standard of medical care.

## 2023-04-09 ENCOUNTER — Encounter: Payer: Self-pay | Admitting: Family Medicine

## 2023-04-09 ENCOUNTER — Ambulatory Visit: Admitting: Family Medicine

## 2023-04-09 ENCOUNTER — Other Ambulatory Visit: Payer: Self-pay | Admitting: *Deleted

## 2023-04-09 DIAGNOSIS — Z79899 Other long term (current) drug therapy: Secondary | ICD-10-CM

## 2023-04-09 DIAGNOSIS — M351 Other overlap syndromes: Secondary | ICD-10-CM

## 2023-04-09 NOTE — Telephone Encounter (Signed)
 I left pt a voicemail to see if she wanted to reschedule for Monday, I blocked a 2:30 & 3pm; she can be scheduled at either time or if there is another time that works better for her.

## 2023-04-10 ENCOUNTER — Encounter (HOSPITAL_BASED_OUTPATIENT_CLINIC_OR_DEPARTMENT_OTHER): Payer: Self-pay

## 2023-04-10 ENCOUNTER — Emergency Department (HOSPITAL_BASED_OUTPATIENT_CLINIC_OR_DEPARTMENT_OTHER)
Admission: EM | Admit: 2023-04-10 | Discharge: 2023-04-10 | Disposition: A | Discharge status: Home / Self Care | Attending: Emergency Medicine | Admitting: Emergency Medicine

## 2023-04-10 ENCOUNTER — Emergency Department (HOSPITAL_BASED_OUTPATIENT_CLINIC_OR_DEPARTMENT_OTHER)

## 2023-04-10 ENCOUNTER — Other Ambulatory Visit: Payer: Self-pay

## 2023-04-10 DIAGNOSIS — R1013 Epigastric pain: Secondary | ICD-10-CM | POA: Diagnosis not present

## 2023-04-10 DIAGNOSIS — R1010 Upper abdominal pain, unspecified: Secondary | ICD-10-CM | POA: Diagnosis present

## 2023-04-10 LAB — COMPREHENSIVE METABOLIC PANEL
ALT: 13 U/L (ref 0–44)
AST: 17 U/L (ref 15–41)
Albumin: 3.5 g/dL (ref 3.5–5.0)
Alkaline Phosphatase: 47 U/L (ref 38–126)
Anion gap: 8 (ref 5–15)
BUN: 12 mg/dL (ref 6–20)
CO2: 27 mmol/L (ref 22–32)
Calcium: 8.8 mg/dL — ABNORMAL LOW (ref 8.9–10.3)
Chloride: 104 mmol/L (ref 98–111)
Creatinine, Ser: 0.69 mg/dL (ref 0.44–1.00)
GFR, Estimated: >60 mL/min (ref >60)
Glucose, Bld: 108 mg/dL — ABNORMAL HIGH (ref 70–99)
Potassium: 3.4 mmol/L — ABNORMAL LOW (ref 3.5–5.1)
Sodium: 139 mmol/L (ref 135–145)
Total Bilirubin: 0.8 mg/dL (ref 0.0–1.2)
Total Protein: 7.9 g/dL (ref 6.5–8.1)

## 2023-04-10 LAB — URINALYSIS, ROUTINE W REFLEX MICROSCOPIC
Bilirubin Urine: NEGATIVE
Glucose, UA: NEGATIVE mg/dL
Hgb urine dipstick: NEGATIVE
Ketones, ur: NEGATIVE mg/dL
Leukocytes,Ua: NEGATIVE
Nitrite: NEGATIVE
Protein, ur: NEGATIVE mg/dL
Specific Gravity, Urine: >=1.03 (ref 1.005–1.030)
pH: 6 (ref 5.0–8.0)

## 2023-04-10 LAB — CBC WITH DIFFERENTIAL/PLATELET
Abs Immature Granulocytes: 0 10*3/uL (ref 0.00–0.07)
Basophils Absolute: 0 10*3/uL (ref 0.0–0.1)
Basophils Relative: 1 %
Eosinophils Absolute: 0.1 10*3/uL (ref 0.0–0.5)
Eosinophils Relative: 2 %
HCT: 38 % (ref 36.0–46.0)
Hemoglobin: 12 g/dL (ref 12.0–15.0)
Immature Granulocytes: 0 %
Lymphocytes Relative: 17 %
Lymphs Abs: 0.6 10*3/uL — ABNORMAL LOW (ref 0.7–4.0)
MCH: 28.9 pg (ref 26.0–34.0)
MCHC: 31.6 g/dL (ref 30.0–36.0)
MCV: 91.6 fL (ref 80.0–100.0)
Monocytes Absolute: 0.4 10*3/uL (ref 0.1–1.0)
Monocytes Relative: 10 %
Neutro Abs: 2.5 10*3/uL (ref 1.7–7.7)
Neutrophils Relative %: 70 %
Platelets: 270 10*3/uL (ref 150–400)
RBC: 4.15 MIL/uL (ref 3.87–5.11)
RDW: 14.9 % (ref 11.5–15.5)
WBC: 3.6 10*3/uL — ABNORMAL LOW (ref 4.0–10.5)
nRBC: 0 % (ref 0.0–0.2)

## 2023-04-10 LAB — LIPASE, BLOOD: Lipase: 31 U/L (ref 11–51)

## 2023-04-10 MED ORDER — IOHEXOL 300 MG/ML  SOLN
100.0000 mL | Freq: Once | INTRAMUSCULAR | Status: AC | PRN
  Administered 2023-04-10: 100 mL via INTRAVENOUS

## 2023-04-10 MED ORDER — FENTANYL CITRATE PF 50 MCG/ML IJ SOSY
50.0000 ug | PREFILLED_SYRINGE | Freq: Once | INTRAMUSCULAR | Status: AC
  Administered 2023-04-10: 50 ug via INTRAVENOUS
  Filled 2023-04-10: qty 1

## 2023-04-10 MED ORDER — FAMOTIDINE IN NACL 20-0.9 MG/50ML-% IV SOLN
20.0000 mg | Freq: Once | INTRAVENOUS | Status: AC
Start: 2023-04-10 — End: 2023-04-10
  Administered 2023-04-10: 20 mg via INTRAVENOUS
  Filled 2023-04-10: qty 50

## 2023-04-10 MED ORDER — SUCRALFATE 1 G PO TABS: 1.0000 g | ORAL_TABLET | Freq: Three times a day (TID) | ORAL | 0 refills | Status: DC

## 2023-04-10 NOTE — Discharge Instructions (Signed)
 Follow-up with your gastroenterologist as you have scheduled.  Start Carafate with meals.  Please try to avoid foods that cause acid reflux.

## 2023-04-10 NOTE — ED Provider Notes (Signed)
 [place] [facility] Provider Note   CSN: [id number] Arrival date & time: 04/10/23  4696     History  Chief Complaint  Patient presents with   Abdominal Pain    Patricia Reed is a 63 y.o. female.  Upper abdominal pain for the last few days.  History of lupus.  Denies any black or bloody stools.  History of gastritis ulcer.  Denies any nausea vomiting diarrhea.  Has not had much bowel movement.  May be some constipation.  History of lupus reflux high cholesterol fibromyalgia.  Denies any weakness numbness tingling.  No chest pain or shortness of breath.  Nothing makes it worse or better.  Has follow-up with GI later this week.  The history is provided by the patient.       Home Medications Prior to Admission medications   Medication Sig Start Date End Date Taking? Authorizing Provider  sucralfate (CARAFATE) 1 g tablet Take 1 tablet (1 g total) by mouth 4 (four) times daily -  with meals and at bedtime for 14 days. 04/10/23 04/24/23 Yes Bryam Taborda, DO  acetaminophen (TYLENOL) 650 MG CR tablet Take 650 mg by mouth every 8 (eight) hours as needed for pain.    [provider]  albuterol (PROVENTIL) (2.5 MG/3ML) 0.083% nebulizer solution Take 3 mLs (2.5 mg total) by nebulization every 6 (six) hours as needed for wheezing or shortness of breath. 03/30/19   Glenford Bayley, NP  Ascorbic Acid (VITAMIN C PO) Take by mouth.    [provider]  B-D INS SYR ULTRAFINE 1CC/30G 30G X 1/2" 1 ML MISC  03/05/18   [provider]  baclofen (LIORESAL) 10 MG tablet TAKE 1 TABLET BY MOUTH DAILY AS NEEDED FOR MUSCLE SPASMS. 03/04/23   Gearldine Bienenstock, PA-C  CALCIUM PO Take by mouth daily.    [provider]  Cholecalciferol 25 MCG (1000 UT) capsule Take by mouth.    [provider]  diclofenac sodium (VOLTAREN) 1 % GEL Apply 3 g to 3 large joints up to 3 times daily. Patient not taking: Reported on 01/21/2023 05/11/17    Gearldine Bienenstock, PA-C  DULoxetine (CYMBALTA) 30 MG capsule Take 30 mg by mouth 2 (two) times daily.    [provider]  Estradiol 10 MCG TABS vaginal tablet Yuvafem 10 mcg vaginal tablet    [provider]  eszopiclone 3 MG TABS Take 1 tablet (3 mg total) by mouth at bedtime as needed. Take immediately before bedtime 10/21/22   [place], [name], MD  famotidine (PEPCID) 20 MG tablet TAKE 1 TO 2 TABLETS BY MOUTH AT BEDTIME 10/20/19   [place], [name], MD  fluticasone furoate-vilanterol (BREO ELLIPTA) 100-25 MCG/INH AEPB Inhale 1 puff into the lungs daily. 03/30/19   Glenford Bayley, NP  folic acid (FOLVITE) 1 MG tablet TAKE 2 TABLETS BY MOUTH EVERY DAY 11/24/21   Gearldine Bienenstock, PA-C  hydrochlorothiazide (HYDRODIURIL) 25 MG tablet Take 1 tablet (25 mg total) by mouth daily. 11/02/22   [place], [name], MD  lovastatin (MEVACOR) 20 MG tablet TAKE 1 TABLET BY MOUTH EVERYDAY AT BEDTIME Patient not taking: Reported on 01/21/2023 11/23/22   [place], [name], MD  methotrexate 50 MG/2ML injection Inject 0.4 mL into skin once weekly. Patient taking differently: Inject 0.6 mL into skin once weekly. 05/07/21   Gearldine Bienenstock, PA-C  nystatin-triamcinolone (MYCOLOG II) cream Apply 1 application topically 2 (two) times daily. 01/24/20  Gearldine Bienenstock, PA-C  Omega-3 Fatty Acids (FISH OIL) 1000 MG CAPS Take by mouth.    [provider]  ondansetron (ZOFRAN) 4 MG tablet Take 1 tablet (4 mg total) by mouth every 8 (eight) hours as needed for nausea or vomiting. Patient not taking: Reported on 01/21/2023 02/19/22   Deeann Saint, MD  pantoprazole (PROTONIX) 40 MG tablet TAKE 1 TABLET BY MOUTH EVERY DAY BEFORE BREAKFAST 11/23/22   [place], [name], MD  potassium chloride SA (KLOR-CON M20) 20 MEQ tablet Take 1 tablet (20 mEq total) by mouth daily. 03/15/23   [place], [name], MD  Tuberculin-Allergy Syringes (ALLERGY SYRINGE 1CC/27GX1/2") 27G X 1/2" 1 ML MISC Patient to use to inject MTX weekly 08/21/16    Pollyann Savoy, MD  valACYclovir (VALTREX) 500 MG tablet TAKE 1 TABLET (500 MG TOTAL) BY MOUTH 2 (TWO) TIMES DAILY. FOR 3 DAYS WITH ACUTE EPISODES. 03/15/23   [place], [name], MD  vitamin B-12 (CYANOCOBALAMIN) 100 MCG tablet Take 100 mcg by mouth daily.    [provider]  VITAMIN D, ERGOCALCIFEROL, PO Take 1 capsule by mouth daily.    [provider]  XELJANZ XR 11 MG TB24 Take 1 tablet (11 mg total) by mouth daily. 03/10/23   Pollyann Savoy, MD  Zinc Citrate-Phytase (ZYTAZE) 25-500 MG CAPS Take by mouth.    [provider]      Allergies    Aspirin, Hydrocodone-acetaminophen, Meperidine hcl, Morphine, Oxycodone-acetaminophen, Ibuprofen, Codeine, Demerol [meperidine], Imdur [isosorbide nitrate], Isosorbide dinitrate, Procaine hcl, Toprol xl [metoprolol], Toradol [ketorolac tromethamine], and Tramadol    Review of Systems   Review of Systems  Physical Exam Updated Vital Signs BP (!) 146/102 (BP Location: Right Arm)   Pulse 77   Temp 97.9 F (36.6 C)   Resp 18   Ht 5\' 5"  (1.651 m)   Wt 98.9 kg   SpO2 100%   BMI 36.28 kg/m  Physical Exam Vitals and nursing note reviewed.  Constitutional:      General: She is not in acute distress.    Appearance: She is well-developed. She is not ill-appearing.  HENT:     Head: Normocephalic and atraumatic.     Mouth/Throat:     Mouth: Mucous membranes are moist.  Eyes:     Extraocular Movements: Extraocular movements intact.     Conjunctiva/sclera: Conjunctivae normal.  Cardiovascular:     Rate and Rhythm: Normal rate and regular rhythm.     Heart sounds: Normal heart sounds. No murmur heard. Pulmonary:     Effort: Pulmonary effort is normal. No respiratory distress.     Breath sounds: Normal breath sounds.  Abdominal:     Palpations: Abdomen is soft.     Tenderness: There is generalized abdominal tenderness.  Musculoskeletal:        General: No swelling.     Cervical back: Neck supple.  Skin:     General: Skin is warm and dry.     Capillary Refill: Capillary refill takes less than 2 seconds.  Neurological:     Mental Status: She is alert.  Psychiatric:        Mood and Affect: Mood normal.     ED Results / Procedures / Treatments   Labs (all labs ordered are listed, but only abnormal results are displayed) Labs Reviewed  CBC WITH DIFFERENTIAL/PLATELET - Abnormal; Notable for the following components:      Result Value   WBC 3.6 (*)    Lymphs Abs 0.6 (*)  All other components within normal limits  COMPREHENSIVE METABOLIC PANEL - Abnormal; Notable for the following components:   Potassium 3.4 (*)    Glucose, Bld 108 (*)    Calcium 8.8 (*)    All other components within normal limits  LIPASE, BLOOD  URINALYSIS, ROUTINE W REFLEX MICROSCOPIC    EKG EKG Interpretation Date/Time:  Saturday April 10 2023 07:30:00 EDT Ventricular Rate:  73 PR Interval:  175 QRS Duration:  98 QT Interval:  406 QTC Calculation: 448 R Axis:   7  Text Interpretation: Sinus rhythm Left ventricular hypertrophy Confirmed by Virgina Norfolk [phone]) on 04/10/2023 7:32:34 AM  Radiology CT ABDOMEN PELVIS W CONTRAST Result Date: 04/10/2023 CLINICAL DATA:  Abdominal pain.  Anemia. EXAM: CT ABDOMEN AND PELVIS WITH CONTRAST TECHNIQUE: Multidetector CT imaging of the abdomen and pelvis was performed using the standard protocol following bolus administration of intravenous contrast. RADIATION DOSE REDUCTION: This exam was performed according to the departmental dose-optimization program which includes automated exposure control, adjustment of the mA and/or kV according to patient size and/or use of iterative reconstruction technique. CONTRAST:  OMNIPAQUE IOHEXOL 300 MG/ML  SOLN COMPARISON:  Chest CTA on 08/19/2021 FINDINGS: Lower Chest: No acute findings. Hepatobiliary: A small cyst is seen in the posterior right hepatic lobe. A 1.7 cm homogeneous hypervascular lesion is seen in the left lobe adjacent to  the falciform ligament. This is stable since prior chest CTA in 2023, consistent with benign etiology. No new or enlarging liver lesions identified. Gallbladder is unremarkable. No evidence of biliary ductal dilatation. Pancreas:  No mass or inflammatory changes. Spleen: Within normal limits in size and appearance. Adrenals/Urinary Tract: No suspicious masses identified. No evidence of ureteral calculi or hydronephrosis. Stomach/Bowel: No evidence of obstruction, inflammatory process or abnormal fluid collections. Mild diverticulosis of descending colon noted, without signs of diverticulitis. Vascular/Lymphatic: No pathologically enlarged lymph nodes. No acute vascular findings. Reproductive: Prior hysterectomy noted. Adnexal regions are unremarkable in appearance. Other:  None. Musculoskeletal:  No suspicious bone lesions identified. IMPRESSION: No acute findings. Mild colonic diverticulosis, without radiographic evidence of diverticulitis. Stable 1.7 cm hypervascular lesion in left hepatic lobe, consistent with benign etiology. Possibilities include flash-filling hemangioma, focal nodular hyperplasia, and adenoma. Electronically Signed   By: Danae Orleans M.D.   On: 04/10/2023 09:25    Procedures Procedures    Medications Ordered in ED Medications  fentaNYL (SUBLIMAZE) injection 50 mcg (50 mcg Intravenous Given 04/10/23 0801)  famotidine (PEPCID) IVPB 20 mg premix (20 mg Intravenous New Bag/Given 04/10/23 0804)  iohexol (OMNIPAQUE) 300 MG/ML solution 100 mL (100 mLs Intravenous Contrast Given 04/10/23 0831)    ED Course/ Medical Decision Making/ A&P                                 Medical Decision Making Amount and/or Complexity of Data Reviewed Labs: ordered. Radiology: ordered.  Risk Prescription drug management.   Tynasia Delana Meyer is here with abdominal pain.  History of acid reflux, please.  EKG shows sinus rhythm.  No ischemic changes.  No chest pain.  Doubt cardiac process.   Differential diagnosis could be gastritis versus less likely cholecystitis pancreatitis.  She is not having any melena or hematochezia.  She follows with her GI doctor later this week.  Feels similar to when she has had an ulcer in the past.  She takes omeprazole.  Overall CBC CMP lipase CT scan abdomen pelvis obtained which were  unremarkable per my review and interpretation of labs and images.  No evidence of pancreatitis cholecystitis on CT.  No perforated viscus on CT.  She was given Pepcid, fentanyl in the ED with improvement.  Will add Carafate to her PPI.  Overall we talked extensively about dietary modifications.  She understands return precautions.  Discharged in good condition.  She has follow-up with GI in place.  Told return if symptoms worsen.  This chart was dictated using voice recognition software.  Despite best efforts to proofread,  errors can occur which can change the documentation meaning.         Final Clinical Impression(s) / ED Diagnoses Final diagnoses:  Epigastric pain    Rx / DC Orders ED Discharge Orders          Ordered    sucralfate (CARAFATE) 1 g tablet  3 times daily with meals & bedtime        04/10/23 0931              Virgina Norfolk, DO 04/10/23 [phone]

## 2023-04-10 NOTE — ED Triage Notes (Signed)
 Pt states that she has a history of lupus, and her upper abdomen feels inflamed. Is concerned for an ulcer and states that when she was seen in January she was told her blood levels were low. Pt has an appointment on Wednesday at [facility], but felt she should be seen prior to that. Pt has not been able to sleep due to severe pain in upper abdomen.   Robina Ade, RN

## 2023-04-11 NOTE — Progress Notes (Signed)
 Sed rate remains elevated.  Urine protein creatinine ratio is normal, complements normal, CBC normal except lymphocyte count is low due to immunosuppression, CMP normal, double-stranded DNA negative.  Labs do not indicate an autoimmune disease flare.  No change in treatment advised.

## 2023-04-12 ENCOUNTER — Ambulatory Visit: Admitting: Family Medicine

## 2023-04-13 LAB — COMPREHENSIVE METABOLIC PANEL
AG Ratio: 1.2 (calc) (ref 1.0–2.5)
ALT: 10 U/L (ref 6–29)
AST: 15 U/L (ref 10–35)
Albumin: 4.1 g/dL (ref 3.6–5.1)
Alkaline phosphatase (APISO): 55 U/L (ref 37–153)
BUN: 11 mg/dL (ref 7–25)
CO2: 26 mmol/L (ref 20–32)
Calcium: 9.4 mg/dL (ref 8.6–10.4)
Chloride: 104 mmol/L (ref 98–110)
Creat: 0.71 mg/dL (ref 0.50–1.05)
Globulin: 3.4 g/dL (ref 1.9–3.7)
Glucose, Bld: 97 mg/dL (ref 65–99)
Potassium: 3.6 mmol/L (ref 3.5–5.3)
Sodium: 141 mmol/L (ref 135–146)
Total Bilirubin: 0.8 mg/dL (ref 0.2–1.2)
Total Protein: 7.5 g/dL (ref 6.1–8.1)
eGFR: 97 mL/min/{1.73_m2} (ref >=60)

## 2023-04-13 LAB — CBC WITH DIFFERENTIAL/PLATELET
Absolute Lymphocytes: 559 {cells}/uL — ABNORMAL LOW (ref 850–3900)
Absolute Monocytes: 311 {cells}/uL (ref 200–950)
Basophils Absolute: 21 {cells}/uL (ref 0–200)
Basophils Relative: 0.5 %
Eosinophils Absolute: 42 {cells}/uL (ref 15–500)
Eosinophils Relative: 1 %
HCT: 37.3 % (ref 35.0–45.0)
Hemoglobin: 12 g/dL (ref 11.7–15.5)
MCH: 28.6 pg (ref 27.0–33.0)
MCHC: 32.2 g/dL (ref 32.0–36.0)
MCV: 89 fL (ref 80.0–100.0)
MPV: 11.1 fL (ref 7.5–12.5)
Monocytes Relative: 7.4 %
Neutro Abs: 3268 {cells}/uL (ref 1500–7800)
Neutrophils Relative %: 77.8 %
Platelets: 292 10*3/uL (ref 140–400)
RBC: 4.19 10*6/uL (ref 3.80–5.10)
RDW: 13.8 % (ref 11.0–15.0)
Total Lymphocyte: 13.3 %
WBC: 4.2 10*3/uL (ref 3.8–10.8)

## 2023-04-13 LAB — ANTI-DNA ANTIBODY, DOUBLE-STRANDED: ds DNA Ab: <1 [IU]/mL

## 2023-04-13 LAB — ANTI-NUCLEAR AB-TITER (ANA TITER)
ANA TITER: 1:1280 {titer} — ABNORMAL HIGH
ANA Titer 1: 1:160 {titer} — ABNORMAL HIGH

## 2023-04-13 LAB — PROTEIN / CREATININE RATIO, URINE
Creatinine, Urine: 277 mg/dL — ABNORMAL HIGH (ref 20–275)
Protein/Creat Ratio: 72 mg/g{creat} (ref 24–184)
Protein/Creatinine Ratio: 0.072 mg/mg{creat} (ref 0.024–0.184)
Total Protein, Urine: 20 mg/dL (ref 5–24)

## 2023-04-13 LAB — C3 AND C4
C3 Complement: 201 mg/dL — ABNORMAL HIGH (ref 83–193)
C4 Complement: 31 mg/dL (ref 15–57)

## 2023-04-13 LAB — ANA: Anti Nuclear Antibody (ANA): POSITIVE — AB

## 2023-04-13 LAB — SEDIMENTATION RATE: Sed Rate: 51 mm/h — ABNORMAL HIGH (ref 0–30)

## 2023-04-14 ENCOUNTER — Ambulatory Visit: Admitting: Gastroenterology

## 2023-04-14 ENCOUNTER — Encounter: Payer: Self-pay | Admitting: Rheumatology

## 2023-04-16 ENCOUNTER — Encounter: Payer: Self-pay | Admitting: Family Medicine

## 2023-04-16 ENCOUNTER — Ambulatory Visit (INDEPENDENT_AMBULATORY_CARE_PROVIDER_SITE_OTHER): Admitting: Family Medicine

## 2023-04-16 VITALS — BP 150/90 | HR 64 | Temp 97.8°F | Resp 16 | Ht 65.0 in | Wt 220.6 lb

## 2023-04-16 DIAGNOSIS — I10 Essential (primary) hypertension: Secondary | ICD-10-CM

## 2023-04-16 DIAGNOSIS — R1013 Epigastric pain: Secondary | ICD-10-CM

## 2023-04-16 DIAGNOSIS — E876 Hypokalemia: Secondary | ICD-10-CM

## 2023-04-16 DIAGNOSIS — K219 Gastro-esophageal reflux disease without esophagitis: Secondary | ICD-10-CM

## 2023-04-16 DIAGNOSIS — G4700 Insomnia, unspecified: Secondary | ICD-10-CM

## 2023-04-16 DIAGNOSIS — K921 Melena: Secondary | ICD-10-CM

## 2023-04-16 MED ORDER — PANTOPRAZOLE SODIUM 40 MG PO TBEC: 40.0000 mg | DELAYED_RELEASE_TABLET | Freq: Two times a day (BID) | ORAL | 0 refills | Status: DC

## 2023-04-16 MED ORDER — LOSARTAN POTASSIUM 50 MG PO TABS: 50.0000 mg | ORAL_TABLET | Freq: Every day | ORAL | 1 refills | Status: DC

## 2023-04-16 MED ORDER — ESZOPICLONE 3 MG PO TABS: 3.0000 mg | ORAL_TABLET | Freq: Every evening | ORAL | 1 refills | Status: DC | PRN

## 2023-04-16 NOTE — Assessment & Plan Note (Signed)
 Problem is stable. She would like to continue Lunesta 3 mg daily as needed at bedtime. Continue good sleep hygiene. Follow-up in 6 months, before if needed.

## 2023-04-16 NOTE — Assessment & Plan Note (Signed)
 Last potassium 3.4 on 04/10/2023, for now continue KLOR 20 meq daily. HCTZ side effects discussed. Losartan added today. BMP 7 to 10 days.

## 2023-04-16 NOTE — Assessment & Plan Note (Addendum)
 Protonix increased from 40 mg daily to twice daily. She has appointment with GI scheduled in 04/2023. Continue GERD precautions.

## 2023-04-16 NOTE — Progress Notes (Signed)
 ACUTE VISIT Chief Complaint  Patient presents with   Blood In Stools   HPI: Patricia Reed is a 63 y.o. female  with PMHx significant for GERD, atrophic gastritis, fibromyalgia, hypertension, and polyarthralgia here today complaining of black stools, constant epigastric abdominal pain, and fatigue, starting 3 weeks ago.   She presented to the ED on 3/22 due to severe epigastric pain that was interfering with sleep. Per pt, imaging showed no masses.   He states that she was given fentanyl in the ED to manage her pain.  She describes her pain as an "achy pain" and notes it worsens when laying on her back.  Abdominal Pain This is a new problem. The current episode started 1 to 4 weeks ago. The onset quality is gradual. The problem occurs constantly. The problem has been gradually improving. The pain is located in the epigastric region. The pain is severe. The quality of the pain is aching. The abdominal pain does not radiate. Associated symptoms include arthralgias, melena, myalgias and nausea. Pertinent negatives include no belching, dysuria, fever, flatus, frequency, headaches, hematuria or weight loss. The pain is relieved by Nothing. She has tried H2 blockers and proton pump inhibitors for the symptoms. Her past medical history is significant for GERD.    She reports having a bowel movement with a stool that was entirely "black and tarry". Her last bowel movement was this morning and describes her stool as "brown with black streaks".  She mentions that her H/H has gradually gone down within the past few months.  On Wednesday 2/26, she had a low grade fever of 100, which resolved with Tylenol and rest.   She is complaint with Protonix 40 mg daily.  Has been avoiding any trigger foods such as acidic, spicy, and green "gas producing" foods.  No changes to pain levels after eating.   Denies any heartburn, vomiting, red blood in stools.   Lab Results  Component Value Date   ALT  13 04/10/2023   AST 17 04/10/2023   ALKPHOS 47 04/10/2023   BILITOT 0.8 04/10/2023   Lab Results  Component Value Date   WBC 3.6 (L) 04/10/2023   HGB 12.0 04/10/2023   HCT 38.0 04/10/2023   MCV 91.6 04/10/2023   PLT 270 04/10/2023   Lab Results  Component Value Date   LIPASE 31 04/10/2023   Hypertension: Medications: HCTZ 25 mg daily.  Good compliance and tolerance.  She attributes her elevated BP to her pain.  During her recent ED visit BP was 146/102.  Hypokalemia: Currently she is on K-Lor 20 mK daily.  Lab Results  Component Value Date   NA 139 04/10/2023   CL 104 04/10/2023   K 3.4 (L) 04/10/2023   CO2 27 04/10/2023   BUN 12 04/10/2023   CREATININE 0.69 04/10/2023   GFRNONAA >60 04/10/2023   CALCIUM 8.8 (L) 04/10/2023   ALBUMIN 3.5 04/10/2023   GLUCOSE 108 (H) 04/10/2023   Insomnia: Pt complains of increased fatigue and insomnia, abdominal pain she is reporting today has aggravated problem.  She was previously prescribed Eszopiclone, which helped improve her sleep quality.  However, she has run out of Eszopiclone.  Denies any adverse side effects while taking Eszopiclone.  Review of Systems  Constitutional:  Positive for activity change and fatigue. Negative for chills, fever and weight loss.  Gastrointestinal:  Positive for abdominal pain, melena and nausea. Negative for flatus.  Endocrine: Negative for cold intolerance and heat intolerance.  Genitourinary:  Negative for  decreased urine volume, dysuria, frequency and hematuria.  Musculoskeletal:  Positive for arthralgias and myalgias. Negative for gait problem.  Skin:  Negative for rash.  Neurological:  Negative for syncope, facial asymmetry and headaches.  Psychiatric/Behavioral:  Negative for confusion and hallucinations. The patient is nervous/anxious.   See other pertinent positives and negatives in HPI.  Current Outpatient Medications on File Prior to Visit  Medication Sig Dispense Refill    acetaminophen (TYLENOL) 650 MG CR tablet Take 650 mg by mouth every 8 (eight) hours as needed for pain.     albuterol (PROVENTIL) (2.5 MG/3ML) 0.083% nebulizer solution Take 3 mLs (2.5 mg total) by nebulization every 6 (six) hours as needed for wheezing or shortness of breath. 75 mL 12   Ascorbic Acid (VITAMIN C PO) Take by mouth.     B-D INS SYR ULTRAFINE 1CC/30G 30G X 1/2" 1 ML MISC      baclofen (LIORESAL) 10 MG tablet TAKE 1 TABLET BY MOUTH DAILY AS NEEDED FOR MUSCLE SPASMS. 90 tablet 0   CALCIUM PO Take by mouth daily.     Cholecalciferol 25 MCG (1000 UT) capsule Take by mouth.     diclofenac sodium (VOLTAREN) 1 % GEL Apply 3 g to 3 large joints up to 3 times daily. 3 Tube 3   DULoxetine (CYMBALTA) 30 MG capsule Take 30 mg by mouth 2 (two) times daily.     Estradiol 10 MCG TABS vaginal tablet Yuvafem 10 mcg vaginal tablet     famotidine (PEPCID) 20 MG tablet TAKE 1 TO 2 TABLETS BY MOUTH AT BEDTIME 180 tablet 1   fluticasone furoate-vilanterol (BREO ELLIPTA) 100-25 MCG/INH AEPB Inhale 1 puff into the lungs daily. 28 each 0   folic acid (FOLVITE) 1 MG tablet TAKE 2 TABLETS BY MOUTH EVERY DAY 180 tablet 3   hydrochlorothiazide (HYDRODIURIL) 25 MG tablet Take 1 tablet (25 mg total) by mouth daily. 90 tablet 2   lovastatin (MEVACOR) 20 MG tablet TAKE 1 TABLET BY MOUTH EVERYDAY AT BEDTIME 90 tablet 1   methotrexate 50 MG/2ML injection Inject 0.4 mL into skin once weekly. (Patient taking differently: Inject 0.6 mL into skin once weekly.) 6 mL 0   nystatin-triamcinolone (MYCOLOG II) cream Apply 1 application topically 2 (two) times daily. 60 g 0   Omega-3 Fatty Acids (FISH OIL) 1000 MG CAPS Take by mouth.     ondansetron (ZOFRAN) 4 MG tablet Take 1 tablet (4 mg total) by mouth every 8 (eight) hours as needed for nausea or vomiting. 10 tablet 0   potassium chloride SA (KLOR-CON M20) 20 MEQ tablet Take 1 tablet (20 mEq total) by mouth daily. 90 tablet 2   sucralfate (CARAFATE) 1 g tablet Take 1 tablet  (1 g total) by mouth 4 (four) times daily -  with meals and at bedtime for 14 days. 56 tablet 0   Tuberculin-Allergy Syringes (ALLERGY SYRINGE 1CC/27GX1/2") 27G X 1/2" 1 ML MISC Patient to use to inject MTX weekly 12 each 3   valACYclovir (VALTREX) 500 MG tablet TAKE 1 TABLET (500 MG TOTAL) BY MOUTH 2 (TWO) TIMES DAILY. FOR 3 DAYS WITH ACUTE EPISODES. 18 tablet 1   vitamin B-12 (CYANOCOBALAMIN) 100 MCG tablet Take 100 mcg by mouth daily.     VITAMIN D, ERGOCALCIFEROL, PO Take 1 capsule by mouth daily.     XELJANZ XR 11 MG TB24 Take 1 tablet (11 mg total) by mouth daily. 90 tablet 0   Zinc Citrate-Phytase (ZYTAZE) 25-500 MG CAPS Take by mouth.  No current facility-administered medications on file prior to visit.    Past Medical History:  Diagnosis Date   Allergy    Anemia    Asthma    Blood transfusion without reported diagnosis    Fibromyalgia    GERD (gastroesophageal reflux disease)    Heart murmur    High cholesterol    History of stomach ulcers    Hypertension    Lupus    MI, old    Moderate obstructive sleep apnea 07/11/2021   Osteoarthritis    Rheumatoid arthritis (HCC)    Thyroid disease    Thyroid mass    Vertigo    Allergies  Allergen Reactions   Aspirin Other (See Comments)    GI bleed   Hydrocodone-Acetaminophen Hives and Nausea And Vomiting   Meperidine Hcl Hives and Nausea And Vomiting    Short term memory loss   Morphine Hives and Nausea And Vomiting   Oxycodone-Acetaminophen Hives and Nausea And Vomiting    Reaction unknown   Ibuprofen Other (See Comments)    GI bleed   Codeine Nausea Only    Other reaction(s): Other (see comments)   Demerol [Meperidine] Nausea Only   Imdur [Isosorbide Nitrate] Other (See Comments)    Reaction unknown   Isosorbide Dinitrate Other (See Comments)    Other reaction(s): Other (see comments) Other reaction(s): Other (See Comments) Reaction unknown Reaction unknown   Procaine Hcl Other (See Comments)    Ineffective    Toprol Xl [Metoprolol] Other (See Comments)    Reaction unknown   Toradol [Ketorolac Tromethamine] Itching   Tramadol Itching    Social History   Socioeconomic History   Marital status: Married    Spouse name: Not on file   Number of children: 4   Years of education: Not on file   Highest education level: Not on file  Occupational History   Occupation: TEACHER ASSISTANT    Employer: [employer]  Tobacco Use   Smoking status: Never    Passive exposure: Never   Smokeless tobacco: Never  Vaping Use   Vaping status: Never Used  Substance and Sexual Activity   Alcohol use: Yes    Alcohol/week: 1.0 standard drink of alcohol    Types: 1 Glasses of wine per week    Comment: 1 glass every 3 months   Drug use: No   Sexual activity: Yes    Birth control/protection: None, Surgical    Comment: LAVH  Other Topics Concern   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     Social Drivers of Corporate investment banker Strain: Not on file  Food Insecurity: Not on file  Transportation Needs: Not on file  Physical Activity: Not on file  Stress: Not on file  Social Connections: Not on file   Vitals:   04/16/23 0921  BP: (!) 150/90  Pulse: 64  Resp: 16  Temp: 97.8 F (36.6 C)   Body mass index is 36.71 kg/m.  Physical Exam Vitals and nursing note reviewed.  Constitutional:      General: She is not in acute distress.    Appearance: She is well-developed.  HENT:     Head: Normocephalic and atraumatic.     Mouth/Throat:     Mouth: Mucous membranes are moist.     Pharynx: Oropharynx is clear.  Eyes:     Conjunctiva/sclera: Conjunctivae normal.  Cardiovascular:  Rate and Rhythm: Normal rate and regular rhythm.     Pulses:          Dorsalis pedis pulses are 2+ on the right side and 2+ on the left side.     Heart sounds: No murmur heard. Pulmonary:     Effort: Pulmonary effort is  normal. No respiratory distress.     Breath sounds: Normal breath sounds.  Abdominal:     Palpations: Abdomen is soft. There is no hepatomegaly or mass.     Tenderness: There is abdominal tenderness in the epigastric area. There is no guarding or rebound.  Lymphadenopathy:     Cervical: No cervical adenopathy.  Skin:    General: Skin is warm.     Findings: No erythema or rash.  Neurological:     General: No focal deficit present.     Mental Status: She is alert and oriented to person, place, and time.     Cranial Nerves: No cranial nerve deficit.     Gait: Gait normal.  Psychiatric:        Mood and Affect: Affect normal. Mood is anxious.    ASSESSMENT AND PLAN: Patricia Reed was seen today for melena and epigastric abdominal pain.  Abdominal pain, epigastric  We discussed possible etiologies, lipase in the ED was in normal range. She has history of epigastric abdominal pain in the past as well as atrophic gastritis.  Last EDG in 02/2019 otherwise normal except for a large hiatal hernia. CT abdomen/pelvis with contrast done in the ED on 04/10/2023 was negative for acute findings.  Mild colonic diverticulosis without diverticulitis. Missed initial GI appointment on 04/14/2023.  She has an appointment with GI on 05/14/2023.  Insomnia, unspecified type Assessment & Plan: Problem is stable. She would like to continue Lunesta 3 mg daily as needed at bedtime. Continue good sleep hygiene. Follow-up in 6 months, before if needed.  Orders: -     Eszopiclone; Take 1 tablet (3 mg total) by mouth at bedtime as needed. Take immediately before bedtime  Dispense: 30 tablet; Refill: 1  Hypokalemia Assessment & Plan: Last potassium 3.4 on 04/10/2023, for now continue KLOR 20 meq daily. HCTZ side effects discussed. Losartan added today. BMP 7 to 10 days.  Orders: -     Basic metabolic panel with GFR; Future  Primary hypertension Assessment & Plan: BP has not been adequately  controlled. Currently she is on HCTZ 25 mg daily, today we added losartan 50 mg daily. Continue low-salt diet. Monitor BP at home. BMP in 7 to 10 days.  Orders: -     Losartan Potassium; Take 1 tablet (50 mg total) by mouth daily.  Dispense: 90 tablet; Refill: 1 -     Basic metabolic panel with GFR; Future  Melena With severe epigastric abdominal pain, ?PUD. Protonix dose increased from 40 mg daily to twice daily. She was clearly instructed about warning signs.  Gastroesophageal reflux disease, unspecified whether esophagitis present Assessment & Plan: Severe epigastric abdominal pain, no heartburn or acid reflux. Protonix increased from 40 mg daily to twice daily. She has appointment with GI scheduled in 04/2023. Continue GERD precautions.  Orders: -     Pantoprazole Sodium; Take 1 tablet (40 mg total) by mouth 2 (two) times daily before a meal.  Dispense: 180 tablet; Refill: 0  I spent a total of 40 minutes in both face to face and non face to face activities for this visit on the date of this encounter. During this  time history was obtained and documented, examination was performed, prior labs/imaging reviewed, and assessment/plan discussed.  Return in about 2 months (around 06/16/2023) for chronic problems.  I, Isabelle Course, acting as a scribe for Kinga Cassar [place], MD., have documented all relevant documentation on the behalf of Kristopher Delk [place], MD, as directed by  Phelan Goers [place], MD while in the presence of Prim Morace [place], MD.  I, Alanny Rivers [place], MD, have reviewed all documentation for this visit. The documentation on 04/16/23 for the exam, diagnosis, procedures, and orders are all accurate and complete.  Alfonzo Arca G. [place], MD  [facility]. [place] office.

## 2023-04-16 NOTE — Patient Instructions (Addendum)
 A few things to remember from today's visit:  Insomnia, unspecified type - Plan: eszopiclone 3 MG TABS  Hypokalemia  Primary hypertension - Plan: losartan (COZAAR) 50 MG tablet  Melena  Gastroesophageal reflux disease, unspecified whether esophagitis present - Plan: pantoprazole (PROTONIX) 40 MG tablet  Please let your rheumatologist know that Protonix has been increased, in case Methotrexate needs to be adjusted. Losartan 50 mg added today for blood pressure. Lab in 7-10 days.  Protonix increased to 2 times daily for the next 30 days. Keep appt with gastroenterologist.  If you need refills for medications you take chronically, please call your pharmacy. Do not use My Chart to request refills or for acute issues that need immediate attention. If you send a my chart message, it may take a few days to be addressed, specially if I am not in the office.  Please be sure medication list is accurate. If a new problem present, please set up appointment sooner than planned today.

## 2023-04-16 NOTE — Assessment & Plan Note (Signed)
 BP has not been adequately controlled. Currently she is on HCTZ 25 mg daily, today we added losartan 50 mg daily. Continue low-salt diet. Monitor BP at home. BMP in 7 to 10 days.

## 2023-04-19 ENCOUNTER — Ambulatory Visit: Admitting: Family Medicine

## 2023-04-22 ENCOUNTER — Encounter: Payer: Self-pay | Admitting: Physician Assistant

## 2023-04-22 ENCOUNTER — Other Ambulatory Visit: Payer: Self-pay

## 2023-04-22 ENCOUNTER — Ambulatory Visit: Payer: Managed Care, Other (non HMO) | Attending: Physician Assistant | Admitting: Physician Assistant

## 2023-04-22 VITALS — BP 126/88 | HR 62 | Resp 14 | Ht 65.0 in | Wt 221.0 lb

## 2023-04-22 DIAGNOSIS — R5382 Chronic fatigue, unspecified: Secondary | ICD-10-CM

## 2023-04-22 DIAGNOSIS — M351 Other overlap syndromes: Secondary | ICD-10-CM | POA: Diagnosis not present

## 2023-04-22 DIAGNOSIS — Z8711 Personal history of peptic ulcer disease: Secondary | ICD-10-CM

## 2023-04-22 DIAGNOSIS — M25511 Pain in right shoulder: Secondary | ICD-10-CM | POA: Diagnosis not present

## 2023-04-22 DIAGNOSIS — E559 Vitamin D deficiency, unspecified: Secondary | ICD-10-CM

## 2023-04-22 DIAGNOSIS — Z8719 Personal history of other diseases of the digestive system: Secondary | ICD-10-CM

## 2023-04-22 DIAGNOSIS — M25512 Pain in left shoulder: Secondary | ICD-10-CM

## 2023-04-22 DIAGNOSIS — G4733 Obstructive sleep apnea (adult) (pediatric): Secondary | ICD-10-CM

## 2023-04-22 DIAGNOSIS — M797 Fibromyalgia: Secondary | ICD-10-CM

## 2023-04-22 DIAGNOSIS — F5101 Primary insomnia: Secondary | ICD-10-CM

## 2023-04-22 DIAGNOSIS — Z79899 Other long term (current) drug therapy: Secondary | ICD-10-CM

## 2023-04-22 DIAGNOSIS — M7062 Trochanteric bursitis, left hip: Secondary | ICD-10-CM

## 2023-04-22 DIAGNOSIS — Z8679 Personal history of other diseases of the circulatory system: Secondary | ICD-10-CM

## 2023-04-22 DIAGNOSIS — Z7952 Long term (current) use of systemic steroids: Secondary | ICD-10-CM

## 2023-04-22 DIAGNOSIS — M170 Bilateral primary osteoarthritis of knee: Secondary | ICD-10-CM

## 2023-04-22 DIAGNOSIS — Z111 Encounter for screening for respiratory tuberculosis: Secondary | ICD-10-CM

## 2023-04-22 DIAGNOSIS — G8929 Other chronic pain: Secondary | ICD-10-CM

## 2023-04-22 DIAGNOSIS — Z8709 Personal history of other diseases of the respiratory system: Secondary | ICD-10-CM

## 2023-04-22 DIAGNOSIS — Z8639 Personal history of other endocrine, nutritional and metabolic disease: Secondary | ICD-10-CM

## 2023-04-22 DIAGNOSIS — M7061 Trochanteric bursitis, right hip: Secondary | ICD-10-CM

## 2023-04-22 MED ORDER — ALLERGY SYRINGE 27G X 1/2" 1 ML MISC: 3 refills | Status: AC

## 2023-04-22 MED ORDER — METHOTREXATE SODIUM CHEMO INJECTION 50 MG/2ML: INTRAMUSCULAR | 0 refills | Status: AC

## 2023-04-22 NOTE — Patient Instructions (Signed)
 Standing Labs We placed an order today for your standing lab work.   Please have your standing labs drawn in June and every 3 months.   TB gold due in June   Please have your labs drawn 2 weeks prior to your appointment so that the provider can discuss your lab results at your appointment, if possible.  Please note that you may see your imaging and lab results in MyChart before we have reviewed them. We will contact you once all results are reviewed. Please allow our office up to 72 hours to thoroughly review all of the results before contacting the office for clarification of your results.  WALK-IN LAB HOURS  Monday through Thursday from 8:00 am -12:30 pm and 1:00 pm-5:00 pm and Friday from 8:00 am-12:00 pm.  Patients with office visits requiring labs will be seen before walk-in labs.  You may encounter longer than normal wait times. Please allow additional time. Wait times may be shorter on  Monday and Thursday afternoons.  We do not book appointments for walk-in labs. We appreciate your patience and understanding with our staff.   Labs are drawn by [facility]. Please bring your co-pay at the time of your lab draw.  You may receive a bill from [facility] for your lab work.  Please note if you are on Hydroxychloroquine and and an order has been placed for a Hydroxychloroquine level,  you will need to have it drawn 4 hours or more after your last dose.  If you wish to have your labs drawn at another location, please call the office 24 hours in advance so we can fax the orders.  The office is located at [address], [address], [place], [place] [address]   If you have any questions regarding directions or hours of operation,  please call [phone].   As a reminder, please drink plenty of water prior to coming for your lab work. Thanks!

## 2023-04-22 NOTE — Telephone Encounter (Signed)
 Please review and sign "no print" methotrexate prescription to reflect dose change that was advised today. and refill syringes. Thanks!

## 2023-04-29 ENCOUNTER — Encounter: Payer: Self-pay | Admitting: Family Medicine

## 2023-04-29 ENCOUNTER — Other Ambulatory Visit (INDEPENDENT_AMBULATORY_CARE_PROVIDER_SITE_OTHER)

## 2023-04-29 DIAGNOSIS — I10 Essential (primary) hypertension: Secondary | ICD-10-CM

## 2023-04-29 DIAGNOSIS — E876 Hypokalemia: Secondary | ICD-10-CM

## 2023-04-29 LAB — BASIC METABOLIC PANEL WITH GFR
BUN: 13 mg/dL (ref 6–23)
CO2: 32 meq/L (ref 19–32)
Calcium: 9.4 mg/dL (ref 8.4–10.5)
Chloride: 101 meq/L (ref 96–112)
Creatinine, Ser: 0.78 mg/dL (ref 0.40–1.20)
GFR: 82.23 mL/min (ref >60.00)
Glucose, Bld: 105 mg/dL — ABNORMAL HIGH (ref 70–99)
Potassium: 3.5 meq/L (ref 3.5–5.1)
Sodium: 140 meq/L (ref 135–145)

## 2023-05-11 ENCOUNTER — Encounter: Payer: Self-pay | Admitting: Family Medicine

## 2023-05-11 DIAGNOSIS — A6000 Herpesviral infection of urogenital system, unspecified: Secondary | ICD-10-CM

## 2023-05-11 MED ORDER — VALACYCLOVIR HCL 500 MG PO TABS: ORAL_TABLET | ORAL | 1 refills | Status: DC

## 2023-05-14 ENCOUNTER — Other Ambulatory Visit

## 2023-05-14 ENCOUNTER — Encounter: Payer: Self-pay | Admitting: Gastroenterology

## 2023-05-14 ENCOUNTER — Ambulatory Visit: Admitting: Gastroenterology

## 2023-05-14 VITALS — BP 112/72 | HR 67 | Ht 65.0 in | Wt 222.1 lb

## 2023-05-14 DIAGNOSIS — Z8711 Personal history of peptic ulcer disease: Secondary | ICD-10-CM

## 2023-05-14 DIAGNOSIS — R110 Nausea: Secondary | ICD-10-CM | POA: Diagnosis not present

## 2023-05-14 DIAGNOSIS — K219 Gastro-esophageal reflux disease without esophagitis: Secondary | ICD-10-CM

## 2023-05-14 DIAGNOSIS — R1013 Epigastric pain: Secondary | ICD-10-CM

## 2023-05-14 DIAGNOSIS — K921 Melena: Secondary | ICD-10-CM

## 2023-05-14 LAB — BASIC METABOLIC PANEL WITH GFR
BUN: 14 mg/dL (ref 6–23)
CO2: 33 meq/L — ABNORMAL HIGH (ref 19–32)
Calcium: 9.5 mg/dL (ref 8.4–10.5)
Chloride: 101 meq/L (ref 96–112)
Creatinine, Ser: 0.64 mg/dL (ref 0.40–1.20)
GFR: 95.65 mL/min (ref >60.00)
Glucose, Bld: 102 mg/dL — ABNORMAL HIGH (ref 70–99)
Potassium: 3.9 meq/L (ref 3.5–5.1)
Sodium: 141 meq/L (ref 135–145)

## 2023-05-14 LAB — CBC WITH DIFFERENTIAL/PLATELET
Basophils Absolute: 0 10*3/uL (ref 0.0–0.1)
Basophils Relative: 0.7 % (ref 0.0–3.0)
Eosinophils Absolute: 0.1 10*3/uL (ref 0.0–0.7)
Eosinophils Relative: 1.9 % (ref 0.0–5.0)
HCT: 37.6 % (ref 36.0–46.0)
Hemoglobin: 11.9 g/dL — ABNORMAL LOW (ref 12.0–15.0)
Lymphocytes Relative: 26 % (ref 12.0–46.0)
Lymphs Abs: 0.9 10*3/uL (ref 0.7–4.0)
MCHC: 31.7 g/dL (ref 30.0–36.0)
MCV: 91.1 fl (ref 78.0–100.0)
Monocytes Absolute: 0.2 10*3/uL (ref 0.1–1.0)
Monocytes Relative: 6.2 % (ref 3.0–12.0)
Neutro Abs: 2.3 10*3/uL (ref 1.4–7.7)
Neutrophils Relative %: 65.2 % (ref 43.0–77.0)
Platelets: 258 10*3/uL (ref 150.0–400.0)
RBC: 4.13 Mil/uL (ref 3.87–5.11)
RDW: 15.1 % (ref 11.5–15.5)
WBC: 3.5 10*3/uL — ABNORMAL LOW (ref 4.0–10.5)

## 2023-05-14 MED ORDER — PANTOPRAZOLE SODIUM 40 MG PO TBEC: 40.0000 mg | DELAYED_RELEASE_TABLET | Freq: Two times a day (BID) | ORAL | 0 refills | Status: AC

## 2023-05-14 NOTE — Progress Notes (Signed)
 Chief Complaint: follow-up ulcer flare Primary GI Doctor:Dr. Dominic Friendly  HPI:  Patricia Reed is a 63 year old female with a past medical history of asthma, hypertnesion, CAD, MI, Lupus, anemia, sickle cell trait, fibromyalgia, rheumatoid arthritis, GERD, esophageal stricture s/p botox  injections 2013 and UGI bleed secondary to a large gastric ulcer in 1996.  Patient last seen in the GI office by Marlin Simmonds, NP on 04/27/2019 for hospital follow-up.  She presented to [facility] ED on 03/11/19 with complaints of epigastric pain and intermittent vomiting of partially digested food for 2 weeks. No hematemesis. Hg 12.9 T. Bili 1.3. AST 20. ALT 16.  She underwent an EGD 03/12/2019 by Dr. Nickey Barn which showed a large hiatal hernia.  An upper GI series 03/13/2019 showed mildly sluggish esophageal motility. She received IV PPI, IVF, clear liquid diet, antiemetics and her symptoms significantly improved. She was discharged home on 03/14/2019. She was seen by her PCP and Amlodipine   and Benzapril were discontinued. She was then prescribed Losartan  25mg  daily which she is tolerating well. No further nausea or stomach pain since stopping Amlodipine  and Benzapril. She denies having upper or lower abdominal pain. She is passing a normal brown formed stool most days. No further black stools. She does report having chronic esophageal motility symptoms described as food and liquids slowly pass down her esophagus which has been stable for the past 10 years. Food does not get stuck in the esophagus. She underwent a normal esophageal manometry in 2016. She remains on Pantoprazole  40mg  po bid and Famotidine  20mg  Q HS.   Prior to this visit she saw Marlin Simmonds, NP in office on 02/28/2019 with complaints of having nausea, stomach irritation and black stools. She was concerned she might have another stomach ulcer. She noted her stomach discomfort started 2 to 4 weeks after she began taking Amlodipine  and Benzapril for hypertension.  A rectal exam showed  brown stool that was guaiac negative. She underwent an EGD by Dr. Dominic Friendly 03/21/2018 which was normal. Hg 12.4 up from 11.4. Protonix  40mg  was increased to bid for 2 weeks. An abdominal sonogram 2/09 showed a normal gallbladder, a right liver cyst and hepatic steatosis. No further black stools therefore an EGD was deferred.   04/10/23 seen by ED for Upper abdominal pain for the last few days.She takes omeprazole. Overall CBC CMP lipase CT scan abdomen pelvis obtained which were unremarkable per my review and interpretation of labs and images. No evidence of pancreatitis cholecystitis on CT. No perforated viscus on CT. She was given Pepcid , fentanyl  in the ED with improvement. Will add Carafate  to her PPI.   Interval History   Patient presents today with main complaint of epigastric pain, nausea and melena for the past month. Patient states she has three connective tissue disorders (RA, FB, and lupus) that can intermittently cause joint pain so she will take Tylenol  alternated with Advil several days a week.Patient is on pantoprazole  40 mg po daily long term for GERD. Patient  started on carafate  1g twice daily 1-2 weeks ago until she could get in to see GI. Patient reports dark tarry stools over the past 1.5 weeks have decreased to every stool to every other stool. She has epigastric pain that initially had her doubled over, now she states it is just a constant soreness.  She continues with nausea and eating only soup right now.  Wt Readings from Last 3 Encounters:  05/14/23 222 lb 2 oz (100.8 kg)  04/22/23 221 lb (100.2 kg)  04/16/23  220 lb 9.6 oz (100.1 kg)    Past Medical History:  Diagnosis Date   Allergy     Anemia    Asthma    Blood transfusion without reported diagnosis    Fibromyalgia    GERD (gastroesophageal reflux disease)    Heart murmur    High cholesterol    History of stomach ulcers    Hypertension    Lupus    MI, old    Moderate obstructive sleep apnea 07/11/2021    Osteoarthritis    Rheumatoid arthritis (HCC)    Stomach ulcer    Thyroid  disease    Thyroid  mass    Vertigo     Past Surgical History:  Procedure Laterality Date   ABDOMINAL HYSTERECTOMY     done for menorrhagia, ovaries remain   CERVICAL DISCECTOMY     plates and screws in neck   CESAREAN SECTION     COLONOSCOPY     ESOPHAGEAL MANOMETRY N/A 02/12/2014   Procedure: ESOPHAGEAL MANOMETRY (EM);  Surgeon: Claudette Cue, MD;  Location: [facility]COPY;  Service: Endoscopy;  Laterality: N/A;   ESOPHAGOGASTRODUODENOSCOPY  03/04/2011   Procedure: ESOPHAGOGASTRODUODENOSCOPY (EGD);  Surgeon: Claudette Cue, MD;  Location: [facility];  Service: Endoscopy;  Laterality: N/A;  BOTOX  Injection   ESOPHAGOGASTRODUODENOSCOPY (EGD) WITH PROPOFOL  N/A 03/12/2019   Procedure: ESOPHAGOGASTRODUODENOSCOPY (EGD) WITH PROPOFOL ;  Surgeon: Alvis Jourdain, MD;  Location: [facility] ENDOSCOPY;  Service: Endoscopy;  Laterality: N/A;   exc benign breast lump     TONSILLECTOMY     UPPER GASTROINTESTINAL ENDOSCOPY      Current Outpatient Medications  Medication Sig Dispense Refill   acetaminophen  (TYLENOL ) 650 MG CR tablet Take 650 mg by mouth every 8 (eight) hours as needed for pain.     albuterol  (PROVENTIL ) (2.5 MG/3ML) 0.083% nebulizer solution Take 3 mLs (2.5 mg total) by nebulization every 6 (six) hours as needed for wheezing or shortness of breath. 75 mL 12   Ascorbic Acid (VITAMIN C PO) Take by mouth.     B-D INS SYR ULTRAFINE 1CC/30G 30G X 1/2" 1 ML MISC      baclofen  (LIORESAL ) 10 MG tablet TAKE 1 TABLET BY MOUTH DAILY AS NEEDED FOR MUSCLE SPASMS. 90 tablet 0   CALCIUM PO Take by mouth daily.     Cholecalciferol 25 MCG (1000 UT) capsule Take by mouth.     diclofenac  sodium (VOLTAREN ) 1 % GEL Apply 3 g to 3 large joints up to 3 times daily. 3 Tube 3   Estradiol 10 MCG TABS vaginal tablet Yuvafem 10 mcg vaginal tablet     eszopiclone  3 MG TABS Take 1 tablet (3 mg total) by mouth at bedtime as needed. Take  immediately before bedtime 30 tablet 1   fluticasone  furoate-vilanterol (BREO ELLIPTA ) 100-25 MCG/INH AEPB Inhale 1 puff into the lungs daily. 28 each 0   folic acid  (FOLVITE ) 1 MG tablet TAKE 2 TABLETS BY MOUTH EVERY DAY 180 tablet 3   hydrochlorothiazide  (HYDRODIURIL ) 25 MG tablet Take 1 tablet (25 mg total) by mouth daily. 90 tablet 2   losartan  (COZAAR ) 50 MG tablet Take 1 tablet (50 mg total) by mouth daily. 90 tablet 1   lovastatin  (MEVACOR ) 20 MG tablet TAKE 1 TABLET BY MOUTH EVERYDAY AT BEDTIME 90 tablet 1   methotrexate  50 MG/2ML injection Inject 0.3 mL into skin once weekly. 4 mL 0   nystatin -triamcinolone  (MYCOLOG II) cream Apply 1 application topically 2 (two) times daily. 60 g 0   Omega-3 Fatty Acids (  FISH OIL) 1000 MG CAPS Take by mouth.     potassium chloride  SA (KLOR-CON  M20) 20 MEQ tablet Take 1 tablet (20 mEq total) by mouth daily. 90 tablet 2   Tuberculin-Allergy  Syringes (ALLERGY  SYRINGE 1CC/27GX1/2") 27G X 1/2" 1 ML MISC Patient to use to inject MTX weekly 12 each 3   valACYclovir  (VALTREX ) 500 MG tablet TAKE 1 TABLET (500 MG TOTAL) BY MOUTH 2 (TWO) TIMES DAILY. FOR 3 DAYS WITH ACUTE EPISODES. 18 tablet 1   vitamin B-12 (CYANOCOBALAMIN) 100 MCG tablet Take 100 mcg by mouth daily.     VITAMIN D , ERGOCALCIFEROL , PO Take 1 capsule by mouth daily.     XELJANZ  XR 11 MG TB24 Take 1 tablet (11 mg total) by mouth daily. 90 tablet 0   Zinc Citrate-Phytase (ZYTAZE) 25-500 MG CAPS Take by mouth.     DULoxetine  (CYMBALTA ) 30 MG capsule Take 30 mg by mouth 2 (two) times daily. (Patient not taking: Reported on 05/14/2023)     famotidine  (PEPCID ) 20 MG tablet TAKE 1 TO 2 TABLETS BY MOUTH AT BEDTIME (Patient not taking: Reported on 05/14/2023) 180 tablet 1   pantoprazole  (PROTONIX ) 40 MG tablet Take 1 tablet (40 mg total) by mouth 2 (two) times daily before a meal. 180 tablet 0   sucralfate  (CARAFATE ) 1 g tablet Take 1 tablet (1 g total) by mouth 4 (four) times daily -  with meals and at  bedtime for 14 days. (Patient not taking: Reported on 05/14/2023) 56 tablet 0   No current facility-administered medications for this visit.    Allergies as of 05/14/2023 - Review Complete 05/14/2023  Allergen Reaction Noted   Aspirin Other (See Comments)    Hydrocodone -acetaminophen  Hives and Nausea And Vomiting    Meperidine hcl Hives and Nausea And Vomiting    Morphine Hives and Nausea And Vomiting    Oxycodone -acetaminophen  Hives and Nausea And Vomiting    Ibuprofen Other (See Comments)    Codeine Nausea Only 12/06/2014   Demerol [meperidine] Nausea Only 01/23/2019   Imdur [isosorbide nitrate] Other (See Comments) 02/04/2012   Isosorbide dinitrate Other (See Comments) 02/04/2012   Procaine hcl Other (See Comments)    Toprol xl [metoprolol] Other (See Comments) 02/04/2012   Toradol  [ketorolac  tromethamine ] Itching 01/08/2022   Tramadol  Itching 08/03/2012    Family History  Problem Relation Age of Onset   Alcohol abuse Father    Hypertension Father    Heart disease Father    Other Mother        tobacco use disorder, refuses to see a doctor   Osteoporosis Sister    Colon cancer Maternal Uncle    Diabetes Maternal Grandmother    Healthy Son    Healthy Daughter    Multiple sclerosis Daughter    Colon polyps Neg Hx    Esophageal cancer Neg Hx    Kidney disease Neg Hx    Stomach cancer Neg Hx    Rectal cancer Neg Hx     Review of Systems:    Constitutional: No weight loss, fever, chills, weakness or fatigue HEENT: Eyes: No change in vision               Ears, Nose, Throat:  No change in hearing or congestion Skin: No rash or itching Cardiovascular: No chest pain, chest pressure or palpitations   Respiratory: No SOB or cough Gastrointestinal: See HPI and otherwise negative Genitourinary: No dysuria or change in urinary frequency Neurological: No headache, dizziness or syncope Musculoskeletal: No new muscle  or joint pain Hematologic: No bleeding or  bruising Psychiatric: No history of depression or anxiety    Physical Exam:  Vital signs: BP 112/72   Pulse 67   Ht 5\' 5"  (1.651 m)   Wt 222 lb 2 oz (100.8 kg)   SpO2 100%   BMI 36.96 kg/m   Constitutional:   Pleasant A.A.  female appears to be in NAD, Well developed, Well nourished, alert and cooperative Throat: Oral cavity and pharynx without inflammation, swelling or lesion.  Respiratory: Respirations even and unlabored. Lungs clear to auscultation bilaterally.   No wheezes, crackles, or rhonchi.  Cardiovascular: Normal S1, S2. Regular rate and rhythm. No peripheral edema, cyanosis or pallor.  Gastrointestinal:  Soft, nondistended, epigastric tenderness. No rebound or guarding. Normal bowel sounds. No appreciable masses or hepatomegaly. Rectal: Normal external rectal exam, normal rectal tone,, non-tender, no masses,  hemoccult Negative - small sample no stool in vault Msk:  Symmetrical without gross deformities. Without edema, no deformity or joint abnormality.  Neurologic:  Alert and  oriented x4;  grossly normal neurologically.  Skin:   Dry and intact without significant lesions or rashes. Psychiatric: Oriented to person, place and time. Demonstrates good judgement and reason without abnormal affect or behaviors.  RELEVANT LABS AND IMAGING: CBC    Latest Ref Rng & Units 05/14/2023   10:23 AM 04/10/2023    7:28 AM 04/09/2023   10:44 AM  CBC  WBC 4.0 - 10.5 K/uL 3.5  3.6  4.2   Hemoglobin 12.0 - 15.0 g/dL 62.1  30.8  65.7   Hematocrit 36.0 - 46.0 % 37.6  38.0  37.3   Platelets 150.0 - 400.0 K/uL 258.0  270  292      CMP     Latest Ref Rng & Units 05/14/2023   10:23 AM 04/29/2023    8:20 AM 04/10/2023    7:28 AM  CMP  Glucose 70 - 99 mg/dL 846  962  952   BUN 6 - 23 mg/dL 14  13  12    Creatinine 0.40 - 1.20 mg/dL 8.41  3.24  4.01   Sodium 135 - 145 mEq/L 141  140  139   Potassium 3.5 - 5.1 mEq/L 3.9  3.5  3.4   Chloride 96 - 112 mEq/L 101  101  104   CO2 19 - 32 mEq/L  33  32  27   Calcium 8.4 - 10.5 mg/dL 9.5  9.4  8.8   Total Protein 6.5 - 8.1 g/dL   7.9   Total Bilirubin 0.0 - 1.2 mg/dL   0.8   Alkaline Phos 38 - 126 U/L   47   AST 15 - 41 U/L   17   ALT 0 - 44 U/L   13      Lab Results  Component Value Date   TSH 0.98 06/18/2021   EGD 03/12/2019 by Dr. Nickey Barn: - Large hiatal hernia. The patient appears to have a Type III hiatal hernia. There was anatomic distortion when initially evaluated and the endoscope need to be passed along the lesser curvature to reach the distal gastric lumen. - Normal stomach. - Normal examined duodenum. - No specimens collected.   EGD 03/21/2018 by Dr. Dominic Friendly: - Normal esophagus. - Normal stomach. - Normal examined duodenum. - No specimens collected.   EGD 03/27/3014 by Dr. Arvie Latus: Normal.    In addition, she had undergone EGD with LES Botox  injection by Dr. Arvie Latus in 2013. There are numerous EGD reports to  that dating back to 1996, when the patient had a large gastric ulcer   Esophageal manometry 02/12/2014: Normal.    Colonoscopy 02/16/2014: 1. Mild diverticulosis was noted 2. Internal hemorrhoids 3. The examination was otherwise normal Repeat colonoscopy in 10 years  ECHO 04/22/2017: LV EF: 60% -   65%   Assessment: Encounter Diagnoses  Name Primary?   Abdominal pain, epigastric Yes   Nausea without vomiting    Melena    Gastroesophageal reflux disease, unspecified whether esophagitis present    History of gastric ulcer   63 year old African-American female with past medical history of gastric ulcers that presents with complaints of epigastric pain, melena, and nausea.  Patient admits to taking chronic NSAIDs for connective tissue disorder.  Her most recent Hgb was 12 , it was 14.2 (Feb 2024). Negative fecal occult in office, small stool specimen collected.  I will recheck her CBC and b met today.  Increase her pantoprazole  from once daily to twice daily and advised her to stop all NSAIDs.  Will go ahead  and schedule upper GI endoscopy with Dr. Dominic Friendly in [facility] to evaluate for source of bleeding.  Plan: - Recheck CBC, BMET today -Increase pantoprazole  to 40 mg twice daily  - No NSAIDS (stop Advil) -Schedule EGD in [facility] with Dr. Dominic Friendly. The risks and benefits of EGD with possible biopsies were discussed with the patient who agrees to proceed. -Recall colonoscopy 01/2024  Thank you for the courtesy of this consult. Please call me with any questions or concerns.   Audine Mangione, FNP-C [facility] 05/14/2023, 12:29 PM  Cc: [place], [name], MD

## 2023-05-14 NOTE — Progress Notes (Signed)
 ____________________________________________________________  Attending physician addendum:  Thank you for sending this case to me. I have reviewed the entire note and agree with the plan.  I reviewed the most recent CTAP, and there is no report of hiatal hernia on it such as was described by Dr. Anthoney Kipper on his 2021 upper endoscopy.  I personally reviewed the images and also do not appreciate a hiatal hernia on that scan. Agree with EGD in the [facility].  Lorella Roles, MD  ____________________________________________________________

## 2023-05-14 NOTE — Patient Instructions (Addendum)
 Increase pantoprazole  to 40 mg twice daily  No NSAIDS (stop Advil) Advised to go to the ER if there is any severe weakness, severe abdominal pain, vomit blood, dark red blood in your bowel movement, shortness of breath or chest pain.    Your provider has requested that you go to the basement level for lab work before leaving today. Press "B" on the elevator. The lab is located at the first door on the left as you exit the elevator. You have been scheduled for an endoscopy. Please follow written instructions given to you at your visit today.  If you use inhalers (even only as needed), please bring them with you on the day of your procedure.  If you take any of the following medications, they will need to be adjusted prior to your procedure:   DO NOT TAKE 7 DAYS PRIOR TO TEST- Trulicity (dulaglutide) Ozempic, Wegovy (semaglutide) Mounjaro (tirzepatide) Bydureon Bcise (exanatide extended release)  DO NOT TAKE 1 DAY PRIOR TO YOUR TEST Rybelsus (semaglutide) Adlyxin (lixisenatide) Victoza (liraglutide) Byetta (exanatide) ___________________________________________________________________________  Due to recent changes in healthcare laws, you may see the results of your imaging and laboratory studies on MyChart before your provider has had a chance to review them.  We understand that in some cases there may be results that are confusing or concerning to you. Not all laboratory results come back in the same time frame and the provider may be waiting for multiple results in order to interpret others.  Please give us  48 hours in order for your provider to thoroughly review all the results before contacting the office for clarification of your results.   _______________________________________________________  If your blood pressure at your visit was 140/90 or greater, please contact your primary care physician to follow up on this.  _______________________________________________________  If  you are age 20 or older, your body mass index should be between 23-30. Your Body mass index is 36.96 kg/m. If this is out of the aforementioned range listed, please consider follow up with your Primary Care Provider.  If you are age 35 or younger, your body mass index should be between 19-25. Your Body mass index is 36.96 kg/m. If this is out of the aformentioned range listed, please consider follow up with your Primary Care Provider.   ________________________________________________________  The [facility] providers would like to encourage you to use MYCHART to communicate with providers for non-urgent requests or questions.  Due to long hold times on the telephone, sending your provider a message by [facility] may be a faster and more efficient way to get a response.  Please allow 48 business hours for a response.  Please remember that this is for non-urgent requests.  _______________________________________________________;  Thank you for trusting me with your gastrointestinal care!   Dyanna Glasgow, NP

## 2023-05-21 ENCOUNTER — Ambulatory Visit (AMBULATORY_SURGERY_CENTER): Admitting: Gastroenterology

## 2023-05-21 ENCOUNTER — Encounter: Payer: Self-pay | Admitting: Gastroenterology

## 2023-05-21 VITALS — BP 137/82 | HR 63 | Temp 97.5°F | Resp 15 | Ht 65.0 in | Wt 222.0 lb

## 2023-05-21 DIAGNOSIS — K2951 Unspecified chronic gastritis with bleeding: Secondary | ICD-10-CM | POA: Diagnosis not present

## 2023-05-21 DIAGNOSIS — K2960 Other gastritis without bleeding: Secondary | ICD-10-CM

## 2023-05-21 DIAGNOSIS — K2950 Unspecified chronic gastritis without bleeding: Secondary | ICD-10-CM

## 2023-05-21 DIAGNOSIS — R1013 Epigastric pain: Secondary | ICD-10-CM

## 2023-05-21 DIAGNOSIS — K219 Gastro-esophageal reflux disease without esophagitis: Secondary | ICD-10-CM

## 2023-05-21 DIAGNOSIS — R110 Nausea: Secondary | ICD-10-CM

## 2023-05-21 DIAGNOSIS — K921 Melena: Secondary | ICD-10-CM

## 2023-05-21 DIAGNOSIS — Z8711 Personal history of peptic ulcer disease: Secondary | ICD-10-CM

## 2023-05-21 MED ORDER — SODIUM CHLORIDE 0.9 % IV SOLN
500.0000 mL | Freq: Once | INTRAVENOUS | Status: DC
Start: 2023-05-21 — End: 2023-05-21

## 2023-05-21 NOTE — Op Note (Signed)
 [facility] Patient Name: Patricia Reed Procedure Date: 05/21/2023 10:31 AM MRN: [id number] Endoscopist: Ace Abu L. Dominic Friendly , MD, [id number] Age: 63 Referring MD:  Date of Birth: [birth year] Gender: Female Account #: [id number] Procedure:                Upper GI endoscopy Indications:              Epigastric abdominal pain (clinical details in                            05/14/23 office consult note)                           Reported black stool, heme-negative at office visit                           Recent CTAP unrevealing                           Pain reportedly better and worse in certain                            positions. On exam today focal area of mid                            epigastric abdominal wall tender Medicines:                Monitored Anesthesia Care Procedure:                Pre-Anesthesia Assessment:                           - Prior to the procedure, a History and Physical                            was performed, and patient medications and                            allergies were reviewed. The patient's tolerance of                            previous anesthesia was also reviewed. The risks                            and benefits of the procedure and the sedation                            options and risks were discussed with the patient.                            All questions were answered, and informed consent                            was obtained. Prior Anticoagulants: The patient has  taken no anticoagulant or antiplatelet agents. ASA                            Grade Assessment: II - A patient with mild systemic                            disease. After reviewing the risks and benefits,                            the patient was deemed in satisfactory condition to                            undergo the procedure.                           After obtaining informed consent, the endoscope was                             passed under direct vision. Throughout the                            procedure, the patient's blood pressure, pulse, and                            oxygen saturations were monitored continuously. The                            Olympus Scope SN [id number] was introduced through the                            mouth, and advanced to the second part of duodenum.                            The upper GI endoscopy was accomplished without                            difficulty. The patient tolerated the procedure                            well. Scope In: Scope Out: Findings:                 The larynx was normal.                           The esophagus was normal.                           Atrophic mucosa was found in the entire examined                            stomach.                           Multiple erosions with no bleeding and no stigmata  of recent bleeding were found in the gastric antrum                            and in the prepyloric region of the stomach.                            Biopsies were taken with a cold forceps for                            histology.-Jar 2 (Antrum and body in same pathology                            jar to rule out H. pylori, IHC requested due to                            mucosal atrophy) no retained food or fluid in the                            stomach. Distends well with insufflation.                           The cardia and gastric fundus were normal on                            retroflexion.                           Normal mucosa was found in the entire duodenum.                            Biopsies for histology were taken with a cold                            forceps for evaluation of celiac disease. (Jar 1) Complications:            No immediate complications. Estimated Blood Loss:     Estimated blood loss was minimal. Impression:               - Normal larynx.                           - Normal esophagus.                            - Gastric mucosal atrophy.                           - Gastric erosions with no bleeding and no stigmata                            of recent bleeding. Biopsied.                           - Normal mucosa was found in the entire examined  duodenum. Biopsied.                           Overall clinical picture and considering history                            and exam findings today suggest that this pain may                            be abdominal wall/musculoskeletal in nature and                            therefore perhaps related to patient's underlying                            rheumatologic condition.                           Mild erosive gastric findings likely from NSAID use                            (if H. pylori negative), and do not appear to                            explain the patient's symptoms. Recommendation:           - Patient has a contact number available for                            emergencies. The signs and symptoms of potential                            delayed complications were discussed with the                            patient. Return to normal activities tomorrow.                            Written discharge instructions were provided to the                            patient.                           - Resume previous diet.                           - Continue present medications.                           - Await pathology results. Alfie Alderfer L. Dominic Friendly, MD 05/21/2023 11:05:57 AM This report has been signed electronically.

## 2023-05-21 NOTE — Patient Instructions (Signed)
    Await results of biopsies done  Handout on gastritis given to you today   YOU HAD AN ENDOSCOPIC PROCEDURE TODAY AT THE [facility]:   Refer to the procedure report that was given to you for any specific questions about what was found during the examination.  If the procedure report does not answer your questions, please call your gastroenterologist to clarify.  If you requested that your care partner not be given the details of your procedure findings, then the procedure report has been included in a sealed envelope for you to review at your convenience later.  YOU SHOULD EXPECT: Some feelings of bloating in the abdomen. Passage of more gas than usual.  Walking can help get rid of the air that was put into your GI tract during the procedure and reduce the bloating. If you had a lower endoscopy (such as a colonoscopy or flexible sigmoidoscopy) you may notice spotting of blood in your stool or on the toilet paper. If you underwent a bowel prep for your procedure, you may not have a normal bowel movement for a few days.  Please Note:  You might notice some irritation and congestion in your nose or some drainage.  This is from the oxygen used during your procedure.  There is no need for concern and it should clear up in a day or so.  SYMPTOMS TO REPORT IMMEDIATELY:  Following upper endoscopy (EGD)  Vomiting of blood or coffee ground material  New chest pain or pain under the shoulder blades  Painful or persistently difficult swallowing  New shortness of breath  Fever of 100F or higher  Black, tarry-looking stools  For urgent or emergent issues, a gastroenterologist can be reached at any hour by calling [phone]. Do not use MyChart messaging for urgent concerns.    DIET:  We do recommend a small meal at first, but then you may proceed to your regular diet.  Drink plenty of fluids but you should avoid alcoholic beverages for 24 hours.  ACTIVITY:  You should plan to take  it easy for the rest of today and you should NOT DRIVE or use heavy machinery until tomorrow (because of the sedation medicines used during the test).    FOLLOW UP: Our staff will call the number listed on your records the next business day following your procedure.  We will call around 7:15- 8:00 am to check on you and address any questions or concerns that you may have regarding the information given to you following your procedure. If we do not reach you, we will leave a message.     If any biopsies were taken you will be contacted by phone or by letter within the next 1-3 weeks.  Please call us  at [phone] if you have not heard about the biopsies in 3 weeks.    SIGNATURES/CONFIDENTIALITY: You and/or your care partner have signed paperwork which will be entered into your electronic medical record.  These signatures attest to the fact that that the information above on your After Visit Summary has been reviewed and is understood.  Full responsibility of the confidentiality of this discharge information lies with you and/or your care-partner.

## 2023-05-21 NOTE — Progress Notes (Signed)
 Pt's states no medical or surgical changes since previsit or office visit.

## 2023-05-21 NOTE — Progress Notes (Signed)
 Called to room to assist during endoscopic procedure.  Patient ID and intended procedure confirmed with present staff. Received instructions for my participation in the procedure from the performing physician.

## 2023-05-21 NOTE — Progress Notes (Signed)
 No significant changes to clinical history since GI office visit on 05/14/23. Reports pain in particular area mid epigastrium , changes with position, burning and pulsing and on my exam, tenderness to relatively light palpation of the abdominal wall in that area.  The patient is appropriate for an endoscopic procedure in the ambulatory setting.  - Lorella Roles, MD

## 2023-05-21 NOTE — Progress Notes (Signed)
 Report to PACU, RN, vss, BBS= Clear.

## 2023-05-24 ENCOUNTER — Telehealth: Payer: Self-pay

## 2023-05-24 NOTE — Telephone Encounter (Signed)
  Follow up Call-     05/21/2023    9:27 AM  Call back number  Post procedure Call Back phone  # [phone]  Permission to leave phone message Yes     Patient questions:  Do you have a fever, pain , or abdominal swelling? No. Pain Score  0 *  Have you tolerated food without any problems? Yes.    Have you been able to return to your normal activities? Yes.    Do you have any questions about your discharge instructions: Diet   No. Medications  No. Follow up visit  No.  Do you have questions or concerns about your Care? No.  Actions: * If pain score is 4 or above: No action needed, pain <4.

## 2023-05-26 ENCOUNTER — Other Ambulatory Visit: Payer: Self-pay | Admitting: Rheumatology

## 2023-05-26 LAB — SURGICAL PATHOLOGY

## 2023-05-26 NOTE — Telephone Encounter (Signed)
 Last Fill: 03/10/2023  Labs: 05/14/2023 WBC 3.5, Hgb 11.9, 04/29/2023 Glucose 105  TB Gold: 07/08/2022 Neg    Next Visit: 09/23/2023  Last Visit: 04/22/2023  DX:Mixed connective tissue disease   Current Dose per office note 04/22/2023: Xeljanz  11 mg XR by mouth daily   Okay to refill Xeljanz ?

## 2023-05-31 ENCOUNTER — Telehealth: Payer: Self-pay | Admitting: Family Medicine

## 2023-05-31 ENCOUNTER — Encounter: Payer: Self-pay | Admitting: Gastroenterology

## 2023-05-31 NOTE — Telephone Encounter (Signed)
 FMLA forms to be filled out- placed in provider's folder. Please fax forms to number on form upon completion.

## 2023-05-31 NOTE — Telephone Encounter (Signed)
 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  She touched base about this with Dr. [place] previously.  She wasn't sure if [place] was doing these or if she would be referred to somewhere else to get it done.

## 2023-05-31 NOTE — Telephone Encounter (Signed)
 I called and spoke with pt to see what the FMLA forms were for. They are to cover for flares up of RA, Lupus, and fibromyalgia. She also dropped them off at her Rheumatologist. Advised it would be better for Rheumatology to fill out the forms as they see her for these conditions. Pt verbalized understanding.

## 2023-05-31 NOTE — Telephone Encounter (Signed)
 I called and spoke with pt. She is aware of message below & verbalized understanding.

## 2023-05-31 NOTE — Telephone Encounter (Signed)
 She can arrange an appt with podiatrist, most insurance do not require referral. Thanks, BJ

## 2023-06-01 ENCOUNTER — Other Ambulatory Visit: Payer: Self-pay | Admitting: Physician Assistant

## 2023-06-01 DIAGNOSIS — M797 Fibromyalgia: Secondary | ICD-10-CM

## 2023-06-01 NOTE — Progress Notes (Unsigned)
 Office Visit Note  [name]             Date of Birth: 08-Jun-1960           MRN: [id number]             PCP: [place], [name], MD Referring: [place], [name], MD Visit Date: 06/03/2023 Occupation: @GUAROCC @  Subjective:  Pain in joints and muscles  History of Present Illness: Patricia Reed is a 63 y.o. female with mixed connective tissue disease.  She returns today after her last visit in April 2025.  She remains on Xeljanz  11 mg XR and methotrexate  0.3 mL subcu weekly and folic acid  1 mg.  She reports pain and discomfort in multiple joints which she describes in her shoulders, arms, elbows, hands, and knees and her feet.  Describes pain and discomfort in her right 2nd, 3rd and 4th MTP joints and numbness in those toes.  She states the symptoms have been going on for the last 2 years.  She tried some shoe inserts but did not notice any benefit.  She continues to have generalized pain from fibromyalgia. Patient reports that she has been having flares of fibromyalgia and arthritis.  She is concerned about her job as she is not able to go to work sometimes.  She states the flares are not very often but they happen about twice a month.  She states during these flares she has difficulty getting out of bed.    Activities of Daily Living:  Patient reports morning stiffness for 4.5  hours.   Patient Reports nocturnal pain.  Difficulty dressing/grooming: Reports Difficulty climbing stairs: Reports Difficulty getting out of chair: Reports Difficulty using hands for taps, buttons, cutlery, and/or writing: Reports  Review of Systems  Constitutional:  Positive for fatigue.  HENT:  Positive for mouth sores and mouth dryness.   Eyes:  Negative for dryness.  Respiratory:  Negative for shortness of breath and difficulty breathing.   Cardiovascular:  Positive for palpitations. Negative for chest pain.  Gastrointestinal:  Positive for constipation. Negative for blood in stool and  diarrhea.  Endocrine: Negative for increased urination.  Genitourinary:  Negative for involuntary urination.  Musculoskeletal:  Positive for joint pain, gait problem, joint pain, joint swelling, myalgias, muscle weakness, morning stiffness, muscle tenderness and myalgias.  Skin:  Positive for sensitivity to sunlight. Negative for color change, rash and hair loss.  Allergic/Immunologic: Positive for susceptible to infections.  Neurological:  Positive for headaches. Negative for dizziness.  Hematological:  Negative for swollen glands.  Psychiatric/Behavioral:  Positive for sleep disturbance. Negative for depressed mood. The patient is nervous/anxious.     PMFS History:  Patient Active Problem List   Diagnosis Date Noted   Moderate obstructive sleep apnea 07/11/2021   Daytime sleepiness 03/20/2021   Prediabetes 03/19/2021   Hypokalemia 07/07/2019   Precordial chest pain 05/04/2019   Educated about COVID-19 virus infection 05/04/2019   Persistent vomiting    Abdominal pain, epigastric    Nausea & vomiting 03/11/2019   Gastritis    Nausea without vomiting 02/28/2019   Class 2 obesity with body mass index (BMI) of 37.0 to 37.9 in adult 11/25/2018   Mixed connective tissue disease (HCC) 08/14/2017   Recurrent genital herpes 05/12/2017   Shortness of breath 04/15/2017   Dizziness 02/04/2017   Situational anxiety 06/08/2016   Atypical chest pain 06/08/2016   Trapezius muscle spasm 04/23/2016   Fibromyalgia 02/13/2016   Insomnia 02/13/2016   Chronic fatigue  02/13/2016   Vitamin D  deficiency 11/25/2015   Autoimmune disease (HCC) 11/23/2015   High risk medication use 11/23/2015   Colon cancer screening 01/25/2014   Anterior neck pain 12/01/2013   Inflammatory arthritis 08/18/2012   Allergic rhinitis 06/23/2012   HTN (hypertension) 06/23/2012   Hyperlipidemia 06/23/2012   Thyroid  nodule - benign 06/23/2012   Arthralgia 06/23/2012   Mild intermittent asthma    Sickle cell trait  (HCC)    History of stomach ulcers    ESOPHAGEAL STRICTURE 10/04/2007   GERD 10/04/2007   Atrophic 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   Dysphagia, pharyngoesophageal phase 10/04/2007    Past Medical History:  Diagnosis Date   Allergy     Anemia    Asthma    Blood transfusion without reported diagnosis    Fibromyalgia    GERD (gastroesophageal reflux disease)    Heart murmur    High cholesterol    History of stomach ulcers    Hypertension    Lupus    MI, old    Moderate obstructive sleep apnea 07/11/2021   Osteoarthritis    Rheumatoid arthritis (HCC)    Stomach ulcer    Thyroid  disease    Thyroid  mass    Vertigo     Family History  Problem Relation Age of Onset   Alcohol abuse Father    Hypertension Father    Heart disease Father    Other Mother        tobacco use disorder, refuses to see a doctor   Osteoporosis Sister    Colon cancer Maternal Uncle    Diabetes Maternal Grandmother    Healthy Son    Healthy Daughter    Multiple sclerosis Daughter    Colon polyps Neg Hx    Esophageal cancer Neg Hx    Kidney disease Neg Hx    Stomach cancer Neg Hx    Rectal cancer Neg Hx    Past Surgical History:  Procedure Laterality Date   ABDOMINAL HYSTERECTOMY     done for menorrhagia, ovaries remain   CERVICAL DISCECTOMY     plates and screws in neck   CESAREAN SECTION     COLONOSCOPY     ESOPHAGEAL MANOMETRY N/A 02/12/2014   Procedure: ESOPHAGEAL MANOMETRY (EM);  Surgeon: Claudette Cue, MD;  Location: [facility];  Service: Endoscopy;  Laterality: N/A;   ESOPHAGOGASTRODUODENOSCOPY  03/04/2011   Procedure: ESOPHAGOGASTRODUODENOSCOPY (EGD);  Surgeon: Claudette Cue, MD;  Location: [facility];  Service: Endoscopy;  Laterality: N/A;  BOTOX  Injection   ESOPHAGOGASTRODUODENOSCOPY (EGD) WITH PROPOFOL  N/A 03/12/2019   Procedure: ESOPHAGOGASTRODUODENOSCOPY (EGD) WITH PROPOFOL ;  Surgeon: Alvis Jourdain, MD;  Location: [facility];  Service: Endoscopy;  Laterality: N/A;   exc benign breast  lump     TONSILLECTOMY     UPPER GASTROINTESTINAL ENDOSCOPY     Social History   Social History Narrative   First grade Geologist, 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     Immunization History  Administered Date(s) Administered   Influenza Inj Mdck Quad Pf 10/22/2020   Influenza Split 11/04/2015, 10/11/2017, 10/21/2018   Influenza, Mdck, Trivalent,PF 6+ MOS(egg free) 10/06/2022   Influenza, Quadrivalent, Recombinant, Inj, Pf 10/11/2017   Influenza,inj,Quad PF,6+ Mos 11/04/2015, 10/21/2018, 09/23/2021   Influenza-Unspecified 10/11/2017   PFIZER Comirnaty(Gray Top)Covid-19 Tri-Sucrose Vaccine 05/10/2020   PFIZER(Purple Top)SARS-COV-2 Vaccination 03/20/2019, 04/12/2019, 12/07/2019, 05/10/2020   PNEUMOCOCCAL CONJUGATE-20 10/22/2020   Pfizer(Comirnaty)Fall Seasonal Vaccine 12 years and older 11/16/2021, 10/06/2022   Pneumococcal Polysaccharide-23 01/09/2020   Tdap 08/20/2020  Zoster Recombinant(Shingrix) 10/11/2017, 12/25/2017, 01/09/2020   Zoster, Unspecified 10/11/2017, 12/25/2017     Objective: Vital Signs: BP 116/78 (BP Location: Left Arm, Patient Position: Sitting, Cuff Size: Normal)   Pulse 76   Resp 16   Ht 5\' 5"  (1.651 m)   Wt 217 lb 3.2 oz (98.5 kg)   BMI 36.14 kg/m    Physical Exam Vitals and nursing note reviewed.  Constitutional:      Appearance: She is well-developed.  HENT:     Head: Normocephalic and atraumatic.  Eyes:     Conjunctiva/sclera: Conjunctivae normal.  Cardiovascular:     Rate and Rhythm: Normal rate and regular rhythm.     Heart sounds: Normal heart sounds.  Pulmonary:     Effort: Pulmonary effort is normal.     Breath sounds: Normal breath sounds.  Abdominal:     General: Bowel sounds are normal.     Palpations: Abdomen is soft.  Musculoskeletal:     Cervical back: Normal range of motion.  Lymphadenopathy:     Cervical: No cervical adenopathy.  Skin:    General: Skin is warm and dry.     Capillary Refill: Capillary  refill takes less than 2 seconds.  Neurological:     Mental Status: She is alert and oriented to person, place, and time.  Psychiatric:        Behavior: Behavior normal.      Musculoskeletal Exam: Cervical, thoracic and lumbar spine with good range of motion.  Shoulders, elbows, wrist joints, MCPs PIPs and DIPs with good range of motion with no synovitis.  Hip joints and knee joints in good range of motion without any warmth swelling or effusion.  There was tenderness across MTPs but no synovitis was noted.  She had generalized hyperalgesia and positive tender points.  CDAI Exam: CDAI Score: -- Patient Global: --; Provider Global: -- Swollen: --; Tender: -- Joint Exam 06/03/2023   No joint exam has been documented for this visit   There is currently no information documented on the homunculus. Go to the Rheumatology activity and complete the homunculus joint exam.  Investigation: No additional findings.  Imaging: No results found.  Recent Labs: Lab Results  Component Value Date   WBC 3.5 (L) 05/14/2023   HGB 11.9 (L) 05/14/2023   PLT 258.0 05/14/2023   NA 141 05/14/2023   K 3.9 05/14/2023   CL 101 05/14/2023   CO2 33 (H) 05/14/2023   GLUCOSE 102 (H) 05/14/2023   BUN 14 05/14/2023   CREATININE 0.64 05/14/2023   BILITOT 0.8 04/10/2023   ALKPHOS 47 04/10/2023   AST 17 04/10/2023   ALT 13 04/10/2023   PROT 7.9 04/10/2023   ALBUMIN 3.5 04/10/2023   CALCIUM 9.5 05/14/2023   GFRAA 97 06/24/2020   QFTBGOLDPLUS NEGATIVE 07/08/2022    Speciality Comments: No specialty comments available.  Procedures:  No procedures performed Allergies: Aspirin, Hydrocodone -acetaminophen , Meperidine hcl, Morphine, Oxycodone -acetaminophen , Ibuprofen, Codeine, Demerol [meperidine], Imdur [isosorbide nitrate], Isosorbide dinitrate, Procaine hcl, Toprol xl [metoprolol], Toradol  [ketorolac  tromethamine ], and Tramadol    Assessment / Plan:     Visit Diagnoses: MCTD (mixed connective tissue  disease) (HCC) - +ANA,+Sm,+RNP,+RF, arthritis: She continues to have fatigue, dry mouth, oral ulcers and photosensitivity.  No oral ulcers were noted on the examination.  No synovitis was noted on the examination.  She gives history of generalized arthralgias and myalgias.  Labs obtained on April 09, 2023 sed rate was 51, complements normal and double-stranded ENA was negative.  ANA remains positive.  High risk medication use - Xeljanz  11 mg XR by mouth daily, MTX 0.3 ml sq once weekly (reduced dose low WBC count), and folic acid  2 mg daily.TB gold negative 07/08/22.Lipid panel 10/14/22.  I advised her to reduce folic acid  to 1 mg daily.  She was advised to get labs in June and every 3 months to monitor for drug toxicity.  Information on immunization was placed in the AVS.  She was advised to hold Xeljanz  and methotrexate  if she develops an infection resume if the infection resolves.  FDA blackbox warning for major adverse cardiovascular events was discussed and a handout was placed in the AVS.  Increased risk of and skin cancer associated with Xeljanz  was discussed.  Use of sunscreen and sun protection was advised.  Pain in both hands-she complains of stiffness and discomfort in her hands.  No synovitis was noted.  I will obtain x-rays of her bilateral hands for comparison.  Last x-rays were obtained in 2022.  No radiographic progression was noted.  X-rays were suggestive of osteoarthritis.  Juxta-articular osteopenia was noted.  Chronic pain of both shoulders-she comes with some discomfort in her shoulders.  She had good range of motion in her shoulders without any warmth or effusion.  The pain appears to be coming from fibromyalgia.  Trochanteric bursitis of both hips-she continues to have some tenderness over trochanteric region.  IT band stretches were discussed.  Primary osteoarthritis of both knees-she has discomfort in her knee joints.  No warmth swelling or effusion was noted.  Pain in both  feet-she complains of discomfort in the bilateral feet and numbness in her bilateral 2nd, 3rd and 4th toes.  No synovitis was noted.  She had tenderness on palpation.  She states she has tried shoe inserts without much relief.  I will refer her to podiatrist.  I will also obtain x-rays of bilateral feet today.  No radiographic progression was noted when compared to the previous x-rays.  X-rays were suggestive of osteoarthritis.  Morton's neuroma, unspecified laterality  Fibromyalgia-she appears to have flare of fibromyalgia with generalized pain and discomfort.  She had hyperalgesia and positive tender points.  Need for regular exercise was emphasized.  Benefits of water aerobics and stretching were discussed.  I will refer her to integrative therapies.  Due to ongoing flares of fibromyalgia she requested FMLA.  She states she requires 2 days a month for flares.  Will fill for FMLA papers.  Chronic fatigue-related to fibromyalgia.  Primary insomnia-good sleep hygiene was discussed.  Primary hypertension-blood pressure was normal at 116/78.  Dyslipidemia  History of stomach ulcers - April 10, 2023.  She is on Protonix  40 mg twice daily.  History of gastroesophageal reflux (GERD)  History of asthma  Vitamin D  deficiency  OSA (obstructive sleep apnea)  Orders: Orders Placed This Encounter  Procedures   XR Hand 2 View Right   XR Hand 2 View Left   XR Foot 2 Views Right   XR Foot 2 Views Left   Ambulatory referral to Physical Therapy   Ambulatory referral to Podiatry   No orders of the defined types were placed in this encounter.    Follow-Up Instructions: Return in about 5 months (around 11/03/2023) for MCTD, FMS.   Nicholas Bari, MD  Note - This record has been created using Animal nutritionist.  Chart creation errors have been sought, but may not always  have been located. Such creation errors do not reflect on  the standard of medical care.

## 2023-06-01 NOTE — Telephone Encounter (Signed)
 Last Fill: 03/04/2023  Next Visit: 06/03/2023  Last Visit: 04/22/2023  Dx: Fibromyalgia   Current Dose per office note on 04/22/2023: Dose not discussed   Okay to refill Baclofen ?

## 2023-06-03 ENCOUNTER — Ambulatory Visit (INDEPENDENT_AMBULATORY_CARE_PROVIDER_SITE_OTHER)

## 2023-06-03 ENCOUNTER — Ambulatory Visit: Attending: Rheumatology | Admitting: Rheumatology

## 2023-06-03 ENCOUNTER — Ambulatory Visit

## 2023-06-03 ENCOUNTER — Encounter: Payer: Self-pay | Admitting: Rheumatology

## 2023-06-03 VITALS — BP 116/78 | HR 76 | Resp 16 | Ht 65.0 in | Wt 217.2 lb

## 2023-06-03 DIAGNOSIS — M79671 Pain in right foot: Secondary | ICD-10-CM

## 2023-06-03 DIAGNOSIS — Z79899 Other long term (current) drug therapy: Secondary | ICD-10-CM | POA: Diagnosis not present

## 2023-06-03 DIAGNOSIS — M79642 Pain in left hand: Secondary | ICD-10-CM | POA: Diagnosis not present

## 2023-06-03 DIAGNOSIS — G5760 Lesion of plantar nerve, unspecified lower limb: Secondary | ICD-10-CM

## 2023-06-03 DIAGNOSIS — M351 Other overlap syndromes: Secondary | ICD-10-CM

## 2023-06-03 DIAGNOSIS — M25512 Pain in left shoulder: Secondary | ICD-10-CM

## 2023-06-03 DIAGNOSIS — M79672 Pain in left foot: Secondary | ICD-10-CM

## 2023-06-03 DIAGNOSIS — M25511 Pain in right shoulder: Secondary | ICD-10-CM | POA: Diagnosis not present

## 2023-06-03 DIAGNOSIS — M7061 Trochanteric bursitis, right hip: Secondary | ICD-10-CM

## 2023-06-03 DIAGNOSIS — Z8719 Personal history of other diseases of the digestive system: Secondary | ICD-10-CM

## 2023-06-03 DIAGNOSIS — Z8709 Personal history of other diseases of the respiratory system: Secondary | ICD-10-CM

## 2023-06-03 DIAGNOSIS — M79641 Pain in right hand: Secondary | ICD-10-CM | POA: Diagnosis not present

## 2023-06-03 DIAGNOSIS — M7062 Trochanteric bursitis, left hip: Secondary | ICD-10-CM

## 2023-06-03 DIAGNOSIS — E559 Vitamin D deficiency, unspecified: Secondary | ICD-10-CM

## 2023-06-03 DIAGNOSIS — G4733 Obstructive sleep apnea (adult) (pediatric): Secondary | ICD-10-CM

## 2023-06-03 DIAGNOSIS — R5382 Chronic fatigue, unspecified: Secondary | ICD-10-CM

## 2023-06-03 DIAGNOSIS — M797 Fibromyalgia: Secondary | ICD-10-CM

## 2023-06-03 DIAGNOSIS — E785 Hyperlipidemia, unspecified: Secondary | ICD-10-CM

## 2023-06-03 DIAGNOSIS — I10 Essential (primary) hypertension: Secondary | ICD-10-CM

## 2023-06-03 DIAGNOSIS — F5101 Primary insomnia: Secondary | ICD-10-CM

## 2023-06-03 DIAGNOSIS — M170 Bilateral primary osteoarthritis of knee: Secondary | ICD-10-CM

## 2023-06-03 DIAGNOSIS — Z8711 Personal history of peptic ulcer disease: Secondary | ICD-10-CM

## 2023-06-03 DIAGNOSIS — G8929 Other chronic pain: Secondary | ICD-10-CM

## 2023-06-03 NOTE — Patient Instructions (Addendum)
 Standing Labs We placed an order today for your standing lab work.   Please have your standing labs drawn in June and every 3 months  Please have your labs drawn 2 weeks prior to your appointment so that the provider can discuss your lab results at your appointment, if possible.  Please note that you may see your imaging and lab results in MyChart before we have reviewed them. We will contact you once all results are reviewed. Please allow our office up to 72 hours to thoroughly review all of the results before contacting the office for clarification of your results.  WALK-IN LAB HOURS  Monday through Thursday from 8:00 am -12:30 pm and 1:00 pm-4:00 pm and Friday from 8:00 am-12:00 pm.  Patients with office visits requiring labs will be seen before walk-in labs.  You may encounter longer than normal wait times. Please allow additional time. Wait times may be shorter on  Monday and Thursday afternoons.  We do not book appointments for walk-in labs. We appreciate your patience and understanding with our staff.   Labs are drawn by [facility]. Please bring your co-pay at the time of your lab draw.  You may receive a bill from [facility] for your lab work.  Please note if you are on Hydroxychloroquine  and and an order has been placed for a Hydroxychloroquine  level,  you will need to have it drawn 4 hours or more after your last dose.  If you wish to have your labs drawn at another location, please call the office 24 hours in advance so we can fax the orders.  The office is located at [address], [address], [place], [place] [address]   If you have any questions regarding directions or hours of operation,  please call [phone].   As a reminder, please drink plenty of water prior to coming for your lab work. Thanks!   Vaccines You are taking a medication(s) that can suppress your immune system.  The following immunizations are recommended: Flu annually Covid-19  Td/Tdap (tetanus, diphtheria,  pertussis) every 10 years Pneumonia (Prevnar 15 then Pneumovax 23 at least 1 year apart.  Alternatively, can take Prevnar 20 without needing additional dose) Shingrix: 2 doses from 4 weeks to 6 months apart  Please check with your PCP to make sure you are up to date.   If you have signs or symptoms of an infection or start antibiotics: First, call your PCP for workup of your infection. Hold your medication through the infection, until you complete your antibiotics, and until symptoms resolve if you take the following: Injectable medication (Actemra, Benlysta, Cimzia, Cosentyx, Enbrel, Humira, Kevzara, Orencia, Remicade, Simponi, Stelara, Taltz, Tremfya) Methotrexate  Leflunomide (Arava) Mycophenolate (Cellcept) Xeljanz , Olumiant, or Rinvoq   Because you are taking Xeljanz , Rinvoq, or Olumiant, it is very important to know that this class of medications has a FDA BLACK BOX WARNING for major adverse cardiovascular events (MACE), thrombosis, mortality (including sudden cardiovascular death), serious infections, and lymphomas. MACE is defined as cardiovascular death, myocardial infarction, and stroke. Thrombosis includes deep venous thrombosis (DVT), pulmonary embolism (PE), and arterial thrombosis. If you are a current or former smoker, you are at higher risk for MACE.

## 2023-06-05 ENCOUNTER — Other Ambulatory Visit: Payer: Self-pay | Admitting: Family Medicine

## 2023-06-24 ENCOUNTER — Ambulatory Visit (INDEPENDENT_AMBULATORY_CARE_PROVIDER_SITE_OTHER): Admitting: Podiatry

## 2023-06-24 ENCOUNTER — Encounter: Payer: Self-pay | Admitting: Podiatry

## 2023-06-24 VITALS — Ht 65.0 in | Wt 217.0 lb

## 2023-06-24 DIAGNOSIS — G5763 Lesion of plantar nerve, bilateral lower limbs: Secondary | ICD-10-CM | POA: Diagnosis not present

## 2023-06-24 MED ORDER — TRIAMCINOLONE ACETONIDE 10 MG/ML IJ SUSP
10.0000 mg | Freq: Once | INTRAMUSCULAR | Status: AC
  Administered 2023-06-24: 10 mg via INTRAMUSCULAR

## 2023-06-24 NOTE — Progress Notes (Signed)
 Chief Complaint  Patient presents with   Foot Pain    " I have lupus, fibromyalgia, and she feels like she has a morton's neuroma,  Wearing bedroom shoes, tennis shoes, heels and she feels numb all the time, and she feels liek she is stepping on a marble or a sharp rock all the time"    HPI: 63 y.o. female presenting today with c/o pain, burning/tingling, noted near the third interspace bilaterally.  Patient describes pain on the ball of the foot when standing.  Feels like "something" is on the bottom of the foot when walking, but nothing is actually there.  Denies injury.  This has been a long-term problem for the patient.  She has been dealing with foot pain for close to a year now.  She does report lupus, fibromyalgia, chronic low back pain.  Past Medical History:  Diagnosis Date   Allergy     Anemia    Asthma    Blood transfusion without reported diagnosis    Fibromyalgia    GERD (gastroesophageal reflux disease)    Heart murmur    High cholesterol    History of stomach ulcers    Hypertension    Lupus    MI, old    Moderate obstructive sleep apnea 07/11/2021   Osteoarthritis    Rheumatoid arthritis (HCC)    Stomach ulcer    Thyroid  disease    Thyroid  mass    Vertigo     Past Surgical History:  Procedure Laterality Date   ABDOMINAL HYSTERECTOMY     done for menorrhagia, ovaries remain   CERVICAL DISCECTOMY     plates and screws in neck   CESAREAN SECTION     COLONOSCOPY     ESOPHAGEAL MANOMETRY N/A 02/12/2014   Procedure: ESOPHAGEAL MANOMETRY (EM);  Surgeon: Claudette Cue, MD;  Location: [facility];  Service: Endoscopy;  Laterality: N/A;   ESOPHAGOGASTRODUODENOSCOPY  03/04/2011   Procedure: ESOPHAGOGASTRODUODENOSCOPY (EGD);  Surgeon: Claudette Cue, MD;  Location: [facility];  Service: Endoscopy;  Laterality: N/A;  BOTOX  Injection   ESOPHAGOGASTRODUODENOSCOPY (EGD) WITH PROPOFOL  N/A 03/12/2019   Procedure: ESOPHAGOGASTRODUODENOSCOPY (EGD) WITH PROPOFOL ;   Surgeon: Alvis Jourdain, MD;  Location: [facility]COPY;  Service: Endoscopy;  Laterality: N/A;   exc benign breast lump     TONSILLECTOMY     UPPER GASTROINTESTINAL ENDOSCOPY      Allergies  Allergen Reactions   Aspirin Other (See Comments)    GI bleed   Hydrocodone -Acetaminophen  Hives and Nausea And Vomiting   Meperidine Hcl Hives and Nausea And Vomiting    Short term memory loss   Morphine Hives and Nausea And Vomiting   Oxycodone -Acetaminophen  Hives and Nausea And Vomiting    Reaction unknown   Ibuprofen Other (See Comments)    GI bleed   Codeine Nausea Only    Other reaction(s): Other (see comments)   Demerol [Meperidine] Nausea Only   Imdur [Isosorbide Nitrate] Other (See Comments)    Reaction unknown   Isosorbide Dinitrate Other (See Comments)    Other reaction(s): Other (see comments) Other reaction(s): Other (See Comments) Reaction unknown Reaction unknown   Procaine Hcl Other (See Comments)    Ineffective   Toprol Xl [Metoprolol] Other (See Comments)    Reaction unknown   Toradol  [Ketorolac  Tromethamine ] Itching   Tramadol  Itching    ROS    PHYSICAL EXAM:  General: The patient is alert and oriented x3 in no acute distress.  Dermatology: Skin is warm, dry  and supple bilateral lower extremities. Interspaces are clear of maceration and debris.    Vascular: Palpable pedal pulses bilaterally. Capillary refill within normal limits.  No appreciable edema.  No erythema or calor.  Neurological: Light touch sensation grossly intact bilateral feet.   Musculoskeletal Exam: No pedal deformities noted.  No pain on palpation of the metatarsals.  No pain with ROM of the MPJ's.  There is a negative Muldor's sign with compression of the interspaces bilaterally.  There is pain with compression of the third bilateral interspace dorsal to plantar.    RADIOGRAPHIC EXAM: Outside imaging left and right foot radiographs 06/03/2023 2 views, nonweightbearing Normal osseous  mineralization. Joint spaces preserved.  No fractures or osseous irregularities noted.   ASSESSMENT / PLAN OF CARE: 1. Morton neuroma of both feet     Meds ordered this encounter  Medications   triamcinolone  acetonide (KENALOG ) 10 MG/ML injection 10 mg   None  Discussed patient's condition today and possible etiologies.  Reviewed outside radiographs with patient.  Discussed conservative treatment options, including off-loading, shoe modifications, cortisone injection, NSAID use, and possible surgery if conservative options fail.  Patient cannot tolerate oral NSAIDs.  Verbal consent obtained to proceed with corticosteroid injection to bilateral third interspace at level of the metatarsal heads.  Alcohol skin prep.  Injected 0.5 cc of 0.5% Marcaine  plain, 0.5 cc of 2% lidocaine  plain, 10 mg Kenalog  to the bilateral dorsal third interspace.  Band-Aids applied.  Patient tolerated this well.  Offloading metatarsal pads applied.  Return in about 3 weeks (around 07/15/2023) for Neuroma.   Chananya Canizalez L. Lunda Salines, AACFAS [facility]     [address] [place], [place] [address]                Office [phone]  Fax [phone]

## 2023-06-24 NOTE — Patient Instructions (Signed)
 More silicone pads can be purchased from:  [email or web address]  Look for metatarsal pads.  You can also get these as felt pads off of Amazon, [employer], [employer] or [employer]

## 2023-07-08 ENCOUNTER — Other Ambulatory Visit: Payer: Self-pay | Admitting: Family Medicine

## 2023-07-08 DIAGNOSIS — G4700 Insomnia, unspecified: Secondary | ICD-10-CM

## 2023-07-09 ENCOUNTER — Other Ambulatory Visit: Payer: Self-pay | Admitting: Family Medicine

## 2023-07-09 DIAGNOSIS — A6000 Herpesviral infection of urogenital system, unspecified: Secondary | ICD-10-CM

## 2023-07-14 ENCOUNTER — Telehealth: Payer: Self-pay

## 2023-07-14 ENCOUNTER — Other Ambulatory Visit (HOSPITAL_COMMUNITY): Payer: Self-pay

## 2023-07-14 NOTE — Telephone Encounter (Signed)
 Pharmacy Patient Advocate Encounter   Received notification from CoverMyMeds that prior authorization for Eszopiclone  3MG  tablets  is required/requested.   Insurance verification completed.   The patient is insured through [facility] .   Per test claim: Refill too soon. PA is not needed at this time. Medication was filled 05/31/2023. Next eligible fill date is 08/14/2023.

## 2023-07-15 ENCOUNTER — Encounter: Payer: Self-pay | Admitting: Podiatry

## 2023-07-15 ENCOUNTER — Ambulatory Visit (INDEPENDENT_AMBULATORY_CARE_PROVIDER_SITE_OTHER): Admitting: Podiatry

## 2023-07-15 ENCOUNTER — Other Ambulatory Visit: Payer: Self-pay | Admitting: *Deleted

## 2023-07-15 DIAGNOSIS — G5763 Lesion of plantar nerve, bilateral lower limbs: Secondary | ICD-10-CM | POA: Diagnosis not present

## 2023-07-15 DIAGNOSIS — M351 Other overlap syndromes: Secondary | ICD-10-CM

## 2023-07-15 DIAGNOSIS — Z79899 Other long term (current) drug therapy: Secondary | ICD-10-CM

## 2023-07-15 DIAGNOSIS — Z111 Encounter for screening for respiratory tuberculosis: Secondary | ICD-10-CM

## 2023-07-15 NOTE — Patient Instructions (Signed)
 Continue to offload your forefoot using metatarsal pads for at least the next 2 to 3 weeks and as needed.

## 2023-07-15 NOTE — Progress Notes (Signed)
 Chief Complaint  Patient presents with   Foot Pain     Morton neuroma of both feet. 100 % better. Numbness from lupus. Non diabetic.       HPI: 63 y.o. female presents today following up for bilateral neuroma pain.  She reports doing very well.  Denies pain today.  She does report some persistent numbness.  She does have a longstanding history of fibromyalgia and lupus, rheumatoid arthritis as well.  She is happy with her progression at this point.  Past Medical History:  Diagnosis Date   Allergy     Anemia    Asthma    Blood transfusion without reported diagnosis    Fibromyalgia    GERD (gastroesophageal reflux disease)    Heart murmur    High cholesterol    History of stomach ulcers    Hypertension    Lupus    MI, old    Moderate obstructive sleep apnea 07/11/2021   Osteoarthritis    Rheumatoid arthritis (HCC)    Stomach ulcer    Thyroid  disease    Thyroid  mass    Vertigo     Past Surgical History:  Procedure Laterality Date   ABDOMINAL HYSTERECTOMY     done for menorrhagia, ovaries remain   CERVICAL DISCECTOMY     plates and screws in neck   CESAREAN SECTION     COLONOSCOPY     ESOPHAGEAL MANOMETRY N/A 02/12/2014   Procedure: ESOPHAGEAL MANOMETRY (EM);  Surgeon: Lamar JONETTA Aho, MD;  Location: [facility];  Service: Endoscopy;  Laterality: N/A;   ESOPHAGOGASTRODUODENOSCOPY  03/04/2011   Procedure: ESOPHAGOGASTRODUODENOSCOPY (EGD);  Surgeon: Lamar JONETTA Aho, MD;  Location: [facility];  Service: Endoscopy;  Laterality: N/A;  BOTOX  Injection   ESOPHAGOGASTRODUODENOSCOPY (EGD) WITH PROPOFOL  N/A 03/12/2019   Procedure: ESOPHAGOGASTRODUODENOSCOPY (EGD) WITH PROPOFOL ;  Surgeon: Rollin Dover, MD;  Location: [facility] ENDOSCOPY;  Service: Endoscopy;  Laterality: N/A;   exc benign breast lump     TONSILLECTOMY     UPPER GASTROINTESTINAL ENDOSCOPY      Allergies  Allergen Reactions   Aspirin Other (See Comments)    GI bleed   Hydrocodone -Acetaminophen  Hives and  Nausea And Vomiting   Meperidine Hcl Hives and Nausea And Vomiting    Short term memory loss   Morphine Hives and Nausea And Vomiting   Oxycodone -Acetaminophen  Hives and Nausea And Vomiting    Reaction unknown   Ibuprofen Other (See Comments)    GI bleed   Codeine Nausea Only    Other reaction(s): Other (see comments)   Demerol [Meperidine] Nausea Only   Imdur [Isosorbide Nitrate] Other (See Comments)    Reaction unknown   Isosorbide Dinitrate Other (See Comments)    Other reaction(s): Other (see comments) Other reaction(s): Other (See Comments) Reaction unknown Reaction unknown   Procaine Hcl Other (See Comments)    Ineffective   Toprol Xl [Metoprolol] Other (See Comments)    Reaction unknown   Toradol  [Ketorolac  Tromethamine ] Itching   Tramadol  Itching    ROS    Physical Exam: There were no vitals filed for this visit.  General: The patient is alert and oriented x3 in no acute distress.  Dermatology: Skin is warm, dry and supple bilateral lower extremities. Interspaces are clear of maceration and debris.    Vascular: Palpable pedal pulses bilaterally. Capillary refill within normal limits.  No appreciable edema.  No erythema or calor.  Neurological: Light touch sensation grossly intact bilateral feet.   Musculoskeletal Exam: No pain  on palpation of the previously affected interspaces or with dorsal to plantar compression.  There are some subjective numbness to the skin of the third interspaces going to the toes.  No significant pedal deformities noted.   Assessment/Plan of Care: 1. Morton neuroma of both feet      No orders of the defined types were placed in this encounter.  None  Discussed clinical findings with patient today.  Symptoms appear much improved.  She does endorse some persistent numbness, she does report that this has been going on for years.  Did discuss that it is uncertain whether or not this will resolve but we can help to continue to manage  the pain.  Recommend that she continue to use offloading felt padding and use good supportive shoe gear.  Follow-up as needed   Daxter Paule L. Lamount MAUL, AACFAS [facility]     [address] [place], [place] [address]                Office [phone]  Fax [phone]    Doing great, no pain today following the injection.  Does report some persistent numbness but not having any pain.  Follow-up as needed

## 2023-07-18 ENCOUNTER — Ambulatory Visit: Payer: Self-pay | Admitting: Physician Assistant

## 2023-07-18 LAB — COMPREHENSIVE METABOLIC PANEL WITH GFR
AG Ratio: 1.6 (calc) (ref 1.0–2.5)
ALT: 16 U/L (ref 6–29)
AST: 20 U/L (ref 10–35)
Albumin: 4.2 g/dL (ref 3.6–5.1)
Alkaline phosphatase (APISO): 61 U/L (ref 37–153)
BUN: 14 mg/dL (ref 7–25)
CO2: 33 mmol/L — ABNORMAL HIGH (ref 20–32)
Calcium: 9.4 mg/dL (ref 8.6–10.4)
Chloride: 100 mmol/L (ref 98–110)
Creat: 0.91 mg/dL (ref 0.50–1.05)
Globulin: 2.7 g/dL (ref 1.9–3.7)
Glucose, Bld: 100 mg/dL — ABNORMAL HIGH (ref 65–99)
Potassium: 3.9 mmol/L (ref 3.5–5.3)
Sodium: 141 mmol/L (ref 135–146)
Total Bilirubin: 0.7 mg/dL (ref 0.2–1.2)
Total Protein: 6.9 g/dL (ref 6.1–8.1)
eGFR: 72 mL/min/{1.73_m2} (ref >=60)

## 2023-07-18 LAB — ANTI-DNA ANTIBODY, DOUBLE-STRANDED: ds DNA Ab: <1 [IU]/mL

## 2023-07-18 LAB — CBC WITH DIFFERENTIAL/PLATELET
Absolute Lymphocytes: 1138 {cells}/uL (ref 850–3900)
Absolute Monocytes: 277 {cells}/uL (ref 200–950)
Basophils Absolute: 21 {cells}/uL (ref 0–200)
Basophils Relative: 0.6 %
Eosinophils Absolute: 60 {cells}/uL (ref 15–500)
Eosinophils Relative: 1.7 %
HCT: 36.3 % (ref 35.0–45.0)
Hemoglobin: 11.5 g/dL — ABNORMAL LOW (ref 11.7–15.5)
MCH: 29 pg (ref 27.0–33.0)
MCHC: 31.7 g/dL — ABNORMAL LOW (ref 32.0–36.0)
MCV: 91.4 fL (ref 80.0–100.0)
MPV: 10.4 fL (ref 7.5–12.5)
Monocytes Relative: 7.9 %
Neutro Abs: 2006 {cells}/uL (ref 1500–7800)
Neutrophils Relative %: 57.3 %
Platelets: 309 10*3/uL (ref 140–400)
RBC: 3.97 10*6/uL (ref 3.80–5.10)
RDW: 13.9 % (ref 11.0–15.0)
Total Lymphocyte: 32.5 %
WBC: 3.5 10*3/uL — ABNORMAL LOW (ref 3.8–10.8)

## 2023-07-18 LAB — ANTI-NUCLEAR AB-TITER (ANA TITER)
ANA TITER: 1:1280 {titer} — ABNORMAL HIGH
ANA Titer 1: 1:1280 {titer} — ABNORMAL HIGH

## 2023-07-18 LAB — SEDIMENTATION RATE: Sed Rate: 28 mm/h (ref 0–30)

## 2023-07-18 LAB — ANA: Anti Nuclear Antibody (ANA): POSITIVE — AB

## 2023-07-18 LAB — QUANTIFERON-TB GOLD PLUS
Mitogen-NIL: 9.86 [IU]/mL
NIL: 0.03 [IU]/mL
QuantiFERON-TB Gold Plus: NEGATIVE
TB1-NIL: 0 [IU]/mL
TB2-NIL: 0 [IU]/mL

## 2023-07-18 LAB — PROTEIN / CREATININE RATIO, URINE
Creatinine, Urine: 154 mg/dL (ref 20–275)
Protein/Creat Ratio: 65 mg/g{creat} (ref 24–184)
Protein/Creatinine Ratio: 0.065 mg/mg{creat} (ref 0.024–0.184)
Total Protein, Urine: 10 mg/dL (ref 5–24)

## 2023-07-18 LAB — C3 AND C4
C3 Complement: 182 mg/dL (ref 83–193)
C4 Complement: 29 mg/dL (ref 15–57)

## 2023-08-04 ENCOUNTER — Other Ambulatory Visit: Payer: Self-pay | Admitting: Family Medicine

## 2023-08-04 DIAGNOSIS — I10 Essential (primary) hypertension: Secondary | ICD-10-CM

## 2023-08-26 ENCOUNTER — Other Ambulatory Visit: Payer: Self-pay | Admitting: Physician Assistant

## 2023-08-26 DIAGNOSIS — M797 Fibromyalgia: Secondary | ICD-10-CM

## 2023-08-26 NOTE — Telephone Encounter (Signed)
 Last Fill: 06/01/2023  Next Visit: 09/23/2023  Last Visit: 06/03/2023  Dx: not mentioned  Current Dose per office note on 06/03/2023: not mentioned  Okay to refill Baclofen ?

## 2023-08-28 ENCOUNTER — Encounter: Payer: Self-pay | Admitting: Family Medicine

## 2023-08-30 ENCOUNTER — Other Ambulatory Visit: Payer: Self-pay | Admitting: Rheumatology

## 2023-08-30 MED ORDER — VALACYCLOVIR HCL 500 MG PO TABS
ORAL_TABLET | ORAL | 1 refills | Status: DC
Start: 2023-08-30 — End: 2023-10-26

## 2023-08-30 NOTE — Telephone Encounter (Signed)
Attempted to contact patient and left message to advise patient to call the office.

## 2023-08-30 NOTE — Telephone Encounter (Signed)
 Last Fill: 05/26/2023  Labs: 07/15/2023  ANA remains positive.  CBC and CMP remain stable.  Hemoglobin remains low-please notify the patient.  WBC count remains low but stable.  ESR WNL-Great news!  Urine protein creatinine ratio WNL Complements WNL dsDNA is negative    Labs are not consistent with a flare.   TB Gold: 07/15/2023  TB gold negative   Next Visit: 09/23/2023  Last Visit: 06/03/2023  IK:FRUI (mixed connective tissue disease)   Current Dose per office note on 06/03/2023: Xeljanz  11 mg XR by mouth daily  Okay to refill Xeljanz ?

## 2023-08-30 NOTE — Telephone Encounter (Signed)
 Can you advise in PCP's absence? Thank you!

## 2023-08-30 NOTE — Telephone Encounter (Signed)
 Patient should be holding xeljanz  currently due to outbreak of herpes.  Ok to refill but she should not restart until vesicle have resolved and she has completed the course of anti-virals.

## 2023-08-30 NOTE — Telephone Encounter (Signed)
 I called patient, patient verbalized understanding.

## 2023-09-09 ENCOUNTER — Encounter: Payer: Self-pay | Admitting: Podiatry

## 2023-09-09 ENCOUNTER — Ambulatory Visit (INDEPENDENT_AMBULATORY_CARE_PROVIDER_SITE_OTHER): Admitting: Podiatry

## 2023-09-09 VITALS — Ht 65.0 in | Wt 217.0 lb

## 2023-09-09 DIAGNOSIS — G5763 Lesion of plantar nerve, bilateral lower limbs: Secondary | ICD-10-CM | POA: Diagnosis not present

## 2023-09-09 MED ORDER — TRIAMCINOLONE ACETONIDE 10 MG/ML IJ SUSP
10.0000 mg | Freq: Once | INTRAMUSCULAR | Status: AC
  Administered 2023-09-09: 10 mg

## 2023-09-09 NOTE — Progress Notes (Deleted)
 Office Visit Note  [name]             Date of Birth: 12-05-1960           MRN: [id number]             PCP: [place], [name], MD Referring: [place], [name], MD Visit Date: 09/23/2023 Occupation: @GUAROCC @  Subjective:  No chief complaint on file.   History of Present Illness: Patricia Reed is a 63 y.o. female ***     Activities of Daily Living:  Patient reports morning stiffness for *** {minute/hour:19697}.   Patient {ACTIONS;DENIES/REPORTS:21021675::Denies} nocturnal pain.  Difficulty dressing/grooming: {ACTIONS;DENIES/REPORTS:21021675::Denies} Difficulty climbing stairs: {ACTIONS;DENIES/REPORTS:21021675::Denies} Difficulty getting out of chair: {ACTIONS;DENIES/REPORTS:21021675::Denies} Difficulty using hands for taps, buttons, cutlery, and/or writing: {ACTIONS;DENIES/REPORTS:21021675::Denies}  No Rheumatology ROS completed.   PMFS History:  Patient Active Problem List   Diagnosis Date Noted   Moderate obstructive sleep apnea 07/11/2021   Daytime sleepiness 03/20/2021   Prediabetes 03/19/2021   Hypokalemia 07/07/2019   Precordial chest pain 05/04/2019   Educated about COVID-19 virus infection 05/04/2019   Persistent vomiting    Abdominal pain, epigastric    Nausea & vomiting 03/11/2019   Gastritis    Nausea without vomiting 02/28/2019   Class 2 obesity with body mass index (BMI) of 37.0 to 37.9 in adult 11/25/2018   Mixed connective tissue disease (HCC) 08/14/2017   Recurrent genital herpes 05/12/2017   Shortness of breath 04/15/2017   Dizziness 02/04/2017   Situational anxiety 06/08/2016   Atypical chest pain 06/08/2016   Trapezius muscle spasm 04/23/2016   Fibromyalgia 02/13/2016   Insomnia 02/13/2016   Chronic fatigue 02/13/2016   Vitamin D  deficiency 11/25/2015   Autoimmune disease (HCC) 11/23/2015   High risk medication use 11/23/2015   Colon cancer screening 01/25/2014   Anterior neck pain 12/01/2013   Inflammatory  arthritis 08/18/2012   Allergic rhinitis 06/23/2012   HTN (hypertension) 06/23/2012   Hyperlipidemia 06/23/2012   Thyroid  nodule - benign 06/23/2012   Arthralgia 06/23/2012   Mild intermittent asthma    Sickle cell trait (HCC)    History of stomach ulcers    ESOPHAGEAL STRICTURE 10/04/2007   GERD 10/04/2007   Atrophic 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   Dysphagia, pharyngoesophageal phase 10/04/2007    Past Medical History:  Diagnosis Date   Allergy     Anemia    Asthma    Blood transfusion without reported diagnosis    Fibromyalgia    GERD (gastroesophageal reflux disease)    Heart murmur    High cholesterol    History of stomach ulcers    Hypertension    Lupus    MI, old    Moderate obstructive sleep apnea 07/11/2021   Osteoarthritis    Rheumatoid arthritis (HCC)    Stomach ulcer    Thyroid  disease    Thyroid  mass    Vertigo     Family History  Problem Relation Age of Onset   Alcohol abuse Father    Hypertension Father    Heart disease Father    Other Mother        tobacco use disorder, refuses to see a doctor   Osteoporosis Sister    Colon cancer Maternal Uncle    Diabetes Maternal Grandmother    Healthy Son    Healthy Daughter    Multiple sclerosis Daughter    Colon polyps Neg Hx    Esophageal cancer Neg Hx    Kidney disease Neg Hx    Stomach cancer Neg Hx  Rectal cancer Neg Hx    Past Surgical History:  Procedure Laterality Date   ABDOMINAL HYSTERECTOMY     done for menorrhagia, ovaries remain   CERVICAL DISCECTOMY     plates and screws in neck   CESAREAN SECTION     COLONOSCOPY     ESOPHAGEAL MANOMETRY N/A 02/12/2014   Procedure: ESOPHAGEAL MANOMETRY (EM);  Surgeon: Lamar JONETTA Aho, MD;  Location: [facility];  Service: Endoscopy;  Laterality: N/A;   ESOPHAGOGASTRODUODENOSCOPY  03/04/2011   Procedure: ESOPHAGOGASTRODUODENOSCOPY (EGD);  Surgeon: Lamar JONETTA Aho, MD;  Location: [facility];  Service: Endoscopy;  Laterality: N/A;  BOTOX  Injection    ESOPHAGOGASTRODUODENOSCOPY (EGD) WITH PROPOFOL  N/A 03/12/2019   Procedure: ESOPHAGOGASTRODUODENOSCOPY (EGD) WITH PROPOFOL ;  Surgeon: Rollin Dover, MD;  Location: [facility] ENDOSCOPY;  Service: Endoscopy;  Laterality: N/A;   exc benign breast lump     TONSILLECTOMY     UPPER GASTROINTESTINAL ENDOSCOPY     Social History   Social History Narrative   First grade Geologist, 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     Immunization History  Administered Date(s) Administered   Influenza Inj Mdck Quad Pf 10/22/2020   Influenza Split 11/04/2015, 10/11/2017, 10/21/2018   Influenza, Mdck, Trivalent,PF 6+ MOS(egg free) 10/06/2022   Influenza, Quadrivalent, Recombinant, Inj, Pf 10/11/2017   Influenza,inj,Quad PF,6+ Mos 11/04/2015, 10/21/2018, 09/23/2021   Influenza-Unspecified 10/11/2017   PFIZER Comirnaty(Gray Top)Covid-19 Tri-Sucrose Vaccine 05/10/2020   PFIZER(Purple Top)SARS-COV-2 Vaccination 03/20/2019, 04/12/2019, 12/07/2019, 05/10/2020   PNEUMOCOCCAL CONJUGATE-20 10/22/2020   Pfizer(Comirnaty)Fall Seasonal Vaccine 12 years and older 11/16/2021, 10/06/2022   Pneumococcal Polysaccharide-23 01/09/2020   Tdap 08/20/2020   Zoster Recombinant(Shingrix) 10/11/2017, 12/25/2017, 01/09/2020   Zoster, Unspecified 10/11/2017, 12/25/2017     Objective: Vital Signs: There were no vitals taken for this visit.   Physical Exam   Musculoskeletal Exam: ***  CDAI Exam: CDAI Score: -- Patient Global: --; Provider Global: -- Swollen: --; Tender: -- Joint Exam 09/23/2023   No joint exam has been documented for this visit   There is currently no information documented on the homunculus. Go to the Rheumatology activity and complete the homunculus joint exam.  Investigation: No additional findings.  Imaging: No results found.  Recent Labs: Lab Results  Component Value Date   WBC 3.5 (L) 07/15/2023   HGB 11.5 (L) 07/15/2023   PLT 309 07/15/2023   NA 141 07/15/2023   K 3.9 07/15/2023    CL 100 07/15/2023   CO2 33 (H) 07/15/2023   GLUCOSE 100 (H) 07/15/2023   BUN 14 07/15/2023   CREATININE 0.91 07/15/2023   BILITOT 0.7 07/15/2023   ALKPHOS 47 04/10/2023   AST 20 07/15/2023   ALT 16 07/15/2023   PROT 6.9 07/15/2023   ALBUMIN 3.5 04/10/2023   CALCIUM 9.4 07/15/2023   GFRAA 97 06/24/2020   QFTBGOLDPLUS NEGATIVE 07/15/2023    Speciality Comments: No specialty comments available.  Procedures:  No procedures performed Allergies: Aspirin, Hydrocodone -acetaminophen , Meperidine hcl, Morphine, Oxycodone -acetaminophen , Ibuprofen, Codeine, Demerol [meperidine], Imdur [isosorbide nitrate], Isosorbide dinitrate, Procaine hcl, Toprol xl [metoprolol], Toradol  [ketorolac  tromethamine ], and Tramadol    Assessment / Plan:     Visit Diagnoses: Mixed connective tissue disease (HCC)  High risk medication use  Fibromyalgia  Trochanteric bursitis of both hips  Chronic pain of both shoulders  Primary osteoarthritis of both knees  Morton's neuroma, unspecified laterality  Chronic fatigue  Primary insomnia  Primary hypertension  Dyslipidemia  History of stomach ulcers  OSA (obstructive sleep apnea)  History of gastroesophageal reflux (GERD)  History of asthma  Vitamin D  deficiency  Orders: No orders of the defined types were placed in this encounter.  No orders of the defined types were placed in this encounter.   Face-to-face time spent with patient was *** minutes. Greater than 50% of time was spent in counseling and coordination of care.  Follow-Up Instructions: No follow-ups on file.   Waddell CHRISTELLA Craze, PA-C  Note - This record has been created using Dragon software.  Chart creation errors have been sought, but may not always  have been located. Such creation errors do not reflect on  the standard of medical care.

## 2023-09-09 NOTE — Progress Notes (Signed)
 Chief Complaint  Patient presents with   Neuroma    Patient is here for bilateral morton neuroma, patient states she was doing good for several months until recently, would like bilateral injections    HPI: 63 y.o. female presents today following up for bilateral neuroma pain.  She reports that she was doing very well for some time and on not have any symptoms.  She reports pain has returned over the past couple weeks and limits her ability to ambulate and put weight on her feet.  Past Medical History:  Diagnosis Date   Allergy     Anemia    Asthma    Blood transfusion without reported diagnosis    Fibromyalgia    GERD (gastroesophageal reflux disease)    Heart murmur    High cholesterol    History of stomach ulcers    Hypertension    Lupus    MI, old    Moderate obstructive sleep apnea 07/11/2021   Osteoarthritis    Rheumatoid arthritis (HCC)    Stomach ulcer    Thyroid  disease    Thyroid  mass    Vertigo     Past Surgical History:  Procedure Laterality Date   ABDOMINAL HYSTERECTOMY     done for menorrhagia, ovaries remain   CERVICAL DISCECTOMY     plates and screws in neck   CESAREAN SECTION     COLONOSCOPY     ESOPHAGEAL MANOMETRY N/A 02/12/2014   Procedure: ESOPHAGEAL MANOMETRY (EM);  Surgeon: Lamar JONETTA Aho, MD;  Location: [facility];  Service: Endoscopy;  Laterality: N/A;   ESOPHAGOGASTRODUODENOSCOPY  03/04/2011   Procedure: ESOPHAGOGASTRODUODENOSCOPY (EGD);  Surgeon: Lamar JONETTA Aho, MD;  Location: [facility];  Service: Endoscopy;  Laterality: N/A;  BOTOX  Injection   ESOPHAGOGASTRODUODENOSCOPY (EGD) WITH PROPOFOL  N/A 03/12/2019   Procedure: ESOPHAGOGASTRODUODENOSCOPY (EGD) WITH PROPOFOL ;  Surgeon: Rollin Dover, MD;  Location: [facility] ENDOSCOPY;  Service: Endoscopy;  Laterality: N/A;   exc benign breast lump     TONSILLECTOMY     UPPER GASTROINTESTINAL ENDOSCOPY      Allergies  Allergen Reactions   Aspirin Other (See Comments)    GI bleed    Hydrocodone -Acetaminophen  Hives and Nausea And Vomiting   Meperidine Hcl Hives and Nausea And Vomiting    Short term memory loss   Morphine Hives and Nausea And Vomiting   Oxycodone -Acetaminophen  Hives and Nausea And Vomiting    Reaction unknown   Ibuprofen Other (See Comments)    GI bleed   Codeine Nausea Only    Other reaction(s): Other (see comments)   Demerol [Meperidine] Nausea Only   Imdur [Isosorbide Nitrate] Other (See Comments)    Reaction unknown   Isosorbide Dinitrate Other (See Comments)    Other reaction(s): Other (see comments) Other reaction(s): Other (See Comments) Reaction unknown Reaction unknown   Procaine Hcl Other (See Comments)    Ineffective   Toprol Xl [Metoprolol] Other (See Comments)    Reaction unknown   Toradol  [Ketorolac  Tromethamine ] Itching   Tramadol  Itching    ROS    Physical Exam: There were no vitals filed for this visit.  General: The patient is alert and oriented x3 in no acute distress.  Dermatology: Skin is warm, dry and supple bilateral lower extremities. Interspaces are clear of maceration and debris.    Vascular: Palpable pedal pulses bilaterally. Capillary refill within normal limits.  No appreciable edema.  No erythema or calor.  Neurological: Light touch sensation grossly intact bilateral feet.   Musculoskeletal  Exam: Recurrence of pain to the bilateral forefoot.  Left second interspace and left third interspace most affected with dorsal to plantar compression.  Negative Mulder's click.  Subjective burning sensation going into the affected toes.   Assessment/Plan of Care: 1. Morton neuroma of both feet      Meds ordered this encounter  Medications   triamcinolone  acetonide (KENALOG ) 10 MG/ML injection 10 mg   None  Discussed clinical findings with patient today.  Seems to have had recurrence of the neuroma pain with the right third interspace and left secondary space most affected.  Reiterated importance of use of the  metatarsal pads to offload the area, use of NSAIDs and good supportive shoe gear. Pain potentially complicated with known history of fibromyalgia and Raynaud's phenomenon. Metatarsal pads were fitted and dispensed today.  Verbal consent obtained administer corticosteroid neuroma injection to the left second interspace and right third interspace via dorsal approach.  Alcohol skin prep.  Injected each site with 0.5 cc of 0.5% Marcaine  plain mixed with 10 mg Kenalog .  Band-Aid applied.  Patient tolerated injections well.  Follow-up as needed   Leonid Manus L. Lamount MAUL, AACFAS [facility]     [address] [place], [place] [address]                Office [phone]  Fax [phone]

## 2023-09-23 ENCOUNTER — Ambulatory Visit: Admitting: Physician Assistant

## 2023-09-23 DIAGNOSIS — Z79899 Other long term (current) drug therapy: Secondary | ICD-10-CM

## 2023-09-23 DIAGNOSIS — G4733 Obstructive sleep apnea (adult) (pediatric): Secondary | ICD-10-CM

## 2023-09-23 DIAGNOSIS — M797 Fibromyalgia: Secondary | ICD-10-CM

## 2023-09-23 DIAGNOSIS — Z8719 Personal history of other diseases of the digestive system: Secondary | ICD-10-CM

## 2023-09-23 DIAGNOSIS — E559 Vitamin D deficiency, unspecified: Secondary | ICD-10-CM

## 2023-09-23 DIAGNOSIS — E785 Hyperlipidemia, unspecified: Secondary | ICD-10-CM

## 2023-09-23 DIAGNOSIS — Z8711 Personal history of peptic ulcer disease: Secondary | ICD-10-CM

## 2023-09-23 DIAGNOSIS — R5382 Chronic fatigue, unspecified: Secondary | ICD-10-CM

## 2023-09-23 DIAGNOSIS — G8929 Other chronic pain: Secondary | ICD-10-CM

## 2023-09-23 DIAGNOSIS — M170 Bilateral primary osteoarthritis of knee: Secondary | ICD-10-CM

## 2023-09-23 DIAGNOSIS — I10 Essential (primary) hypertension: Secondary | ICD-10-CM

## 2023-09-23 DIAGNOSIS — M7061 Trochanteric bursitis, right hip: Secondary | ICD-10-CM

## 2023-09-23 DIAGNOSIS — M79671 Pain in right foot: Secondary | ICD-10-CM

## 2023-09-23 DIAGNOSIS — M351 Other overlap syndromes: Secondary | ICD-10-CM

## 2023-09-23 DIAGNOSIS — M79641 Pain in right hand: Secondary | ICD-10-CM

## 2023-09-23 DIAGNOSIS — G5760 Lesion of plantar nerve, unspecified lower limb: Secondary | ICD-10-CM

## 2023-09-23 DIAGNOSIS — F5101 Primary insomnia: Secondary | ICD-10-CM

## 2023-09-23 DIAGNOSIS — Z8709 Personal history of other diseases of the respiratory system: Secondary | ICD-10-CM

## 2023-10-13 ENCOUNTER — Other Ambulatory Visit: Payer: Self-pay | Admitting: Family Medicine

## 2023-10-13 DIAGNOSIS — I10 Essential (primary) hypertension: Secondary | ICD-10-CM

## 2023-10-26 ENCOUNTER — Other Ambulatory Visit: Payer: Self-pay

## 2023-10-26 ENCOUNTER — Other Ambulatory Visit: Payer: Self-pay | Admitting: Family Medicine

## 2023-10-26 DIAGNOSIS — Z79899 Other long term (current) drug therapy: Secondary | ICD-10-CM

## 2023-10-26 LAB — CBC WITH DIFFERENTIAL/PLATELET
Absolute Lymphocytes: 994 {cells}/uL (ref 850–3900)
Absolute Monocytes: 286 {cells}/uL (ref 200–950)
Basophils Absolute: 62 {cells}/uL (ref 0–200)
Basophils Relative: 1.4 %
Eosinophils Absolute: 308 {cells}/uL (ref 15–500)
Eosinophils Relative: 7 %
HCT: 37.9 % (ref 35.0–45.0)
Hemoglobin: 12.3 g/dL (ref 11.7–15.5)
MCH: 29.1 pg (ref 27.0–33.0)
MCHC: 32.5 g/dL (ref 32.0–36.0)
MCV: 89.6 fL (ref 80.0–100.0)
MPV: 10.8 fL (ref 7.5–12.5)
Monocytes Relative: 6.5 %
Neutro Abs: 2750 {cells}/uL (ref 1500–7800)
Neutrophils Relative %: 62.5 %
Platelets: 306 Thousand/uL (ref 140–400)
RBC: 4.23 Million/uL (ref 3.80–5.10)
RDW: 13.7 % (ref 11.0–15.0)
Total Lymphocyte: 22.6 %
WBC: 4.4 Thousand/uL (ref 3.8–10.8)

## 2023-10-26 LAB — COMPREHENSIVE METABOLIC PANEL WITH GFR
AG Ratio: 1.4 (calc) (ref 1.0–2.5)
ALT: 12 U/L (ref 6–29)
AST: 16 U/L (ref 10–35)
Albumin: 4.4 g/dL (ref 3.6–5.1)
Alkaline phosphatase (APISO): 65 U/L (ref 37–153)
BUN: 11 mg/dL (ref 7–25)
CO2: 31 mmol/L (ref 20–32)
Calcium: 9.5 mg/dL (ref 8.6–10.4)
Chloride: 100 mmol/L (ref 98–110)
Creat: 0.7 mg/dL (ref 0.50–1.05)
Globulin: 3.1 g/dL (ref 1.9–3.7)
Glucose, Bld: 92 mg/dL (ref 65–99)
Potassium: 3.7 mmol/L (ref 3.5–5.3)
Sodium: 140 mmol/L (ref 135–146)
Total Bilirubin: 0.5 mg/dL (ref 0.2–1.2)
Total Protein: 7.5 g/dL (ref 6.1–8.1)
eGFR: 98 mL/min/1.73m2 (ref >=60)

## 2023-10-27 ENCOUNTER — Ambulatory Visit: Payer: Self-pay | Admitting: Physician Assistant

## 2023-10-27 NOTE — Progress Notes (Signed)
 CBC and CMP WNL

## 2023-11-06 ENCOUNTER — Other Ambulatory Visit: Payer: Self-pay | Admitting: Family Medicine

## 2023-11-24 ENCOUNTER — Other Ambulatory Visit: Payer: Self-pay | Admitting: Physician Assistant

## 2023-11-24 ENCOUNTER — Other Ambulatory Visit: Payer: Self-pay | Admitting: Rheumatology

## 2023-11-24 DIAGNOSIS — M797 Fibromyalgia: Secondary | ICD-10-CM

## 2023-11-24 NOTE — Telephone Encounter (Signed)
 Last Fill:08/26/2023   Next Visit: 12/21/2023   Last Visit: 06/03/2023   Dx: not mentioned   Current Dose per office note on 06/03/2023: not mentioned   Okay to refill Baclofen ?

## 2023-11-24 NOTE — Telephone Encounter (Signed)
 Last Fill: 08/30/2023  Labs: 10/26/2023  CBC and CMP WNL.   TB Gold: 07/15/2023 Neg    Next Visit: 12/21/2023  Last Visit: 06/03/2023  DX: MCTD (mixed connective tissue disease)   Current Dose per office note 06/03/2023: Xeljanz  11 mg XR by mouth daily   Okay to refill Xeljanz ?

## 2023-11-26 ENCOUNTER — Telehealth: Payer: Self-pay | Admitting: Pharmacist

## 2023-11-26 NOTE — Telephone Encounter (Signed)
 Submitted a Prior Authorization RENEWAL request to [facility] for XELJANZ  via fax. Will update once we receive a response.  Fax: [phone] Phone: [phone] Case #: [id number]

## 2023-11-26 NOTE — Telephone Encounter (Signed)
 Received notification from [facility] regarding a prior authorization for XELJANZ . Authorization has been APPROVED from 11/26/2023 to 11/25/2024. Approval letter sent to scan center.  Patient must continue to fill through [facility]: [phone]  Authorization # [id number]  Sherry Pennant, PharmD, MPH, BCPS, CPP Clinical Pharmacist [facility])

## 2023-12-07 NOTE — Progress Notes (Deleted)
 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 female with history of mixed connective tissue disease.  Patient remains on Xeljanz  11 mg XR by mouth daily, MTX 0.3 ml sq once weekly (reduced dose low WBC count), and folic acid  2 mg daily.   CBC and CMP updated on 10/26/23.   TB gold negative on 07/15/23.   Discussed the importance of holding xeljanz  and methotrexate  if she develops signs or symptoms of an infection and to resume once the infection has completely cleared.   Activities of Daily Living:  Patient reports morning stiffness for *** {minute/hour:19697}.   Patient {ACTIONS;DENIES/REPORTS:21021675::Denies} nocturnal pain.  Difficulty dressing/grooming: {ACTIONS;DENIES/REPORTS:21021675::Denies} Difficulty climbing stairs: {ACTIONS;DENIES/REPORTS:21021675::Denies} Difficulty getting out of chair: {ACTIONS;DENIES/REPORTS:21021675::Denies} Difficulty using hands for taps, buttons, cutlery, and/or writing: {ACTIONS;DENIES/REPORTS:21021675::Denies}  No Rheumatology ROS completed.   PMFS History:  Patient Active Problem List   Diagnosis Date Noted   Moderate obstructive sleep apnea 07/11/2021   Daytime sleepiness 03/20/2021   Prediabetes 03/19/2021   Hypokalemia 07/07/2019   Precordial chest pain 05/04/2019   Educated about COVID-19 virus infection 05/04/2019   Persistent vomiting    Abdominal pain, epigastric    Nausea & vomiting 03/11/2019   Gastritis    Nausea without vomiting 02/28/2019   Class 2 obesity with body mass index (BMI) of 37.0 to 37.9 in adult 11/25/2018   Mixed connective tissue disease 08/14/2017   Recurrent genital herpes 05/12/2017   Shortness of breath 04/15/2017   Dizziness 02/04/2017    Situational anxiety 06/08/2016   Atypical chest pain 06/08/2016   Trapezius muscle spasm 04/23/2016   Fibromyalgia 02/13/2016   Insomnia 02/13/2016   Chronic fatigue 02/13/2016   Vitamin D  deficiency 11/25/2015   Autoimmune disease 11/23/2015   High risk medication use 11/23/2015   Colon cancer screening 01/25/2014   Anterior neck pain 12/01/2013   Inflammatory arthritis 08/18/2012   Allergic rhinitis 06/23/2012   HTN (hypertension) 06/23/2012   Hyperlipidemia 06/23/2012   Thyroid  nodule - benign 06/23/2012   Arthralgia 06/23/2012   Mild intermittent asthma    Sickle cell trait    History of stomach ulcers    ESOPHAGEAL STRICTURE 10/04/2007   GERD 10/04/2007   Atrophic 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   Dysphagia, pharyngoesophageal phase 10/04/2007    Past Medical History:  Diagnosis Date   Allergy     Anemia    Asthma    Blood transfusion without reported diagnosis    Fibromyalgia    GERD (gastroesophageal reflux disease)    Heart murmur    High cholesterol    History of stomach ulcers    Hypertension    Lupus    MI, old    Moderate obstructive sleep apnea 07/11/2021   Osteoarthritis    Rheumatoid arthritis (HCC)    Stomach ulcer    Thyroid  disease    Thyroid  mass    Vertigo     Family History  Problem Relation Age of Onset   Alcohol abuse Father    Hypertension Father    Heart disease Father    Other Mother        tobacco  use disorder, refuses to see a doctor   Osteoporosis Sister    Colon cancer Maternal Uncle    Diabetes Maternal Grandmother    Healthy Son    Healthy Daughter    Multiple sclerosis Daughter    Colon polyps Neg Hx    Esophageal cancer Neg Hx    Kidney disease Neg Hx    Stomach cancer Neg Hx    Rectal cancer Neg Hx    Past Surgical History:  Procedure Laterality Date   ABDOMINAL HYSTERECTOMY     done for menorrhagia, ovaries remain   CERVICAL DISCECTOMY     plates and screws in neck   CESAREAN SECTION     COLONOSCOPY     ESOPHAGEAL  MANOMETRY N/A 02/12/2014   Procedure: ESOPHAGEAL MANOMETRY (EM);  Surgeon: Lamar JONETTA Aho, MD;  Location: [facility];  Service: Endoscopy;  Laterality: N/A;   ESOPHAGOGASTRODUODENOSCOPY  03/04/2011   Procedure: ESOPHAGOGASTRODUODENOSCOPY (EGD);  Surgeon: Lamar JONETTA Aho, MD;  Location: [facility];  Service: Endoscopy;  Laterality: N/A;  BOTOX  Injection   ESOPHAGOGASTRODUODENOSCOPY (EGD) WITH PROPOFOL  N/A 03/12/2019   Procedure: ESOPHAGOGASTRODUODENOSCOPY (EGD) WITH PROPOFOL ;  Surgeon: Rollin Dover, MD;  Location: [facility];  Service: Endoscopy;  Laterality: N/A;   exc benign breast lump     TONSILLECTOMY     UPPER GASTROINTESTINAL ENDOSCOPY     Social History   Tobacco Use   Smoking status: Never    Passive exposure: Never   Smokeless tobacco: Never  Vaping Use   Vaping status: Never Used  Substance Use Topics   Alcohol use: Not Currently    Comment: 1 glass every 3 months   Drug use: No   Social History   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       Immunization History  Administered Date(s) Administered   Influenza Inj Mdck Quad Pf 10/22/2020   Influenza Split 11/04/2015, 10/11/2017, 10/21/2018   Influenza, Mdck, Trivalent,PF 6+ MOS(egg free) 10/06/2022   Influenza, Quadrivalent, Recombinant, Inj, Pf 10/11/2017   Influenza,inj,Quad PF,6+ Mos 11/04/2015, 10/21/2018, 09/23/2021   Influenza-Unspecified 10/11/2017   PFIZER Comirnaty(Gray Top)Covid-19 Tri-Sucrose Vaccine 05/10/2020   PFIZER(Purple Top)SARS-COV-2 Vaccination 03/20/2019, 04/12/2019, 12/07/2019, 05/10/2020   PNEUMOCOCCAL CONJUGATE-20 10/22/2020   Pfizer(Comirnaty)Fall Seasonal Vaccine 12 years and older 11/16/2021, 10/06/2022   Pneumococcal Polysaccharide-23 01/09/2020   Tdap 08/20/2020   Zoster Recombinant(Shingrix) 10/11/2017, 12/25/2017, 01/09/2020   Zoster, Unspecified 10/11/2017, 12/25/2017     Objective: Vital Signs: There were no  vitals taken for this visit.   Physical Exam Vitals and nursing note reviewed.  Constitutional:      Appearance: She is well-developed.  HENT:     Head: Normocephalic and atraumatic.  Eyes:     Conjunctiva/sclera: Conjunctivae normal.  Cardiovascular:     Rate and Rhythm: Normal rate and regular rhythm.     Heart sounds: Normal heart sounds.  Pulmonary:     Effort: Pulmonary effort is normal.     Breath sounds: Normal breath sounds.  Abdominal:     General: Bowel sounds are normal.     Palpations: Abdomen is soft.  Musculoskeletal:     Cervical back: Normal range of motion.  Lymphadenopathy:     Cervical: No cervical adenopathy.  Skin:    General: Skin is warm and dry.     Capillary Refill: Capillary refill takes less than 2 seconds.  Neurological:     Mental Status: She is alert and oriented to person, place,  and time.  Psychiatric:        Behavior: Behavior normal.      Musculoskeletal Exam: ***  CDAI Exam: CDAI Score: -- Patient Global: --; Provider Global: -- Swollen: --; Tender: -- Joint Exam 12/21/2023   No joint exam has been documented for this visit   There is currently no information documented on the homunculus. Go to the Rheumatology activity and complete the homunculus joint exam.  Investigation: No additional findings.  Imaging: No results found.  Recent Labs: Lab Results  Component Value Date   WBC 4.4 10/26/2023   HGB 12.3 10/26/2023   PLT 306 10/26/2023   NA 140 10/26/2023   K 3.7 10/26/2023   CL 100 10/26/2023   CO2 31 10/26/2023   GLUCOSE 92 10/26/2023   BUN 11 10/26/2023   CREATININE 0.70 10/26/2023   BILITOT 0.5 10/26/2023   ALKPHOS 47 04/10/2023   AST 16 10/26/2023   ALT 12 10/26/2023   PROT 7.5 10/26/2023   ALBUMIN 3.5 04/10/2023   CALCIUM 9.5 10/26/2023   GFRAA 97 06/24/2020   QFTBGOLDPLUS NEGATIVE 07/15/2023    Speciality Comments: No specialty comments available.  Procedures:  No procedures performed Allergies:  Aspirin, Hydrocodone -acetaminophen , Meperidine hcl, Morphine, Oxycodone -acetaminophen , Ibuprofen, Codeine, Demerol [meperidine], Imdur [isosorbide nitrate], Isosorbide dinitrate, Procaine hcl, Toprol xl [metoprolol], Toradol  [ketorolac  tromethamine ], and Tramadol    Assessment / Plan:     Visit Diagnoses: Mixed connective tissue disease  High risk medication use  Chronic pain of both shoulders  Trochanteric bursitis of both hips  Primary osteoarthritis of both knees  Morton's neuroma, unspecified laterality  Chronic fatigue  Primary insomnia  Fibromyalgia  Primary hypertension  Dyslipidemia  History of stomach ulcers  History of gastroesophageal reflux (GERD)  Orders: No orders of the defined types were placed in this encounter.  No orders of the defined types were placed in this encounter.   Face-to-face time spent with patient was *** minutes. Greater than 50% of time was spent in counseling and coordination of care.  Follow-Up Instructions: No follow-ups on file.   Waddell CHRISTELLA Craze, PA-C  Note - This record has been created using Dragon software.  Chart creation errors have been sought, but may not always  have been located. Such creation errors do not reflect on  the standard of medical care.

## 2023-12-21 ENCOUNTER — Ambulatory Visit: Admitting: Physician Assistant

## 2023-12-21 DIAGNOSIS — E785 Hyperlipidemia, unspecified: Secondary | ICD-10-CM

## 2023-12-21 DIAGNOSIS — M79642 Pain in left hand: Secondary | ICD-10-CM

## 2023-12-21 DIAGNOSIS — M797 Fibromyalgia: Secondary | ICD-10-CM

## 2023-12-21 DIAGNOSIS — M351 Other overlap syndromes: Secondary | ICD-10-CM

## 2023-12-21 DIAGNOSIS — Z8711 Personal history of peptic ulcer disease: Secondary | ICD-10-CM

## 2023-12-21 DIAGNOSIS — F5101 Primary insomnia: Secondary | ICD-10-CM

## 2023-12-21 DIAGNOSIS — G5760 Lesion of plantar nerve, unspecified lower limb: Secondary | ICD-10-CM

## 2023-12-21 DIAGNOSIS — G8929 Other chronic pain: Secondary | ICD-10-CM

## 2023-12-21 DIAGNOSIS — M7061 Trochanteric bursitis, right hip: Secondary | ICD-10-CM

## 2023-12-21 DIAGNOSIS — Z8719 Personal history of other diseases of the digestive system: Secondary | ICD-10-CM

## 2023-12-21 DIAGNOSIS — M170 Bilateral primary osteoarthritis of knee: Secondary | ICD-10-CM

## 2023-12-21 DIAGNOSIS — I10 Essential (primary) hypertension: Secondary | ICD-10-CM

## 2023-12-21 DIAGNOSIS — Z79899 Other long term (current) drug therapy: Secondary | ICD-10-CM

## 2023-12-21 DIAGNOSIS — R5382 Chronic fatigue, unspecified: Secondary | ICD-10-CM

## 2023-12-22 NOTE — Telephone Encounter (Signed)
 Patient should get labs prior to taking prednisone .  Please schedule an appointment so she can get labs and prednisone  taper as needed.

## 2023-12-22 NOTE — Telephone Encounter (Signed)
 Patient advised should get labs prior to taking prednisone . Patient has been scheduled for a follow up visit on 12/24/2023.

## 2023-12-23 NOTE — Progress Notes (Deleted)
 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   History of Present Illness: Patricia Reed is a 63 y.o. female ***     Activities of Daily Living:  Patient reports morning stiffness for *** {minute/hour:19697}.   Patient {ACTIONS;DENIES/REPORTS:21021675::Denies} nocturnal pain.  Difficulty dressing/grooming: {ACTIONS;DENIES/REPORTS:21021675::Denies} Difficulty climbing stairs: {ACTIONS;DENIES/REPORTS:21021675::Denies} Difficulty getting out of chair: {ACTIONS;DENIES/REPORTS:21021675::Denies} Difficulty using hands for taps, buttons, cutlery, and/or writing: {ACTIONS;DENIES/REPORTS:21021675::Denies}  No Rheumatology ROS completed.   PMFS History:  Patient Active Problem List   Diagnosis Date Noted   Moderate obstructive sleep apnea 07/11/2021   Daytime sleepiness 03/20/2021   Prediabetes 03/19/2021   Hypokalemia 07/07/2019   Precordial chest pain 05/04/2019   Educated about COVID-19 virus infection 05/04/2019   Persistent vomiting    Abdominal pain, epigastric    Nausea & vomiting 03/11/2019   Gastritis    Nausea without vomiting 02/28/2019   Class 2 obesity with body mass index (BMI) of 37.0 to 37.9 in adult 11/25/2018   Mixed connective tissue disease 08/14/2017   Recurrent genital herpes 05/12/2017   Shortness of breath 04/15/2017   Dizziness 02/04/2017   Situational anxiety 06/08/2016   Atypical chest pain 06/08/2016   Trapezius muscle spasm 04/23/2016   Fibromyalgia 02/13/2016   Insomnia 02/13/2016   Chronic fatigue 02/13/2016   Vitamin D  deficiency 11/25/2015   Autoimmune disease 11/23/2015   High risk medication use 11/23/2015   Colon cancer screening 01/25/2014   Anterior neck pain 12/01/2013   Inflammatory arthritis  08/18/2012   Allergic rhinitis 06/23/2012   HTN (hypertension) 06/23/2012   Hyperlipidemia 06/23/2012   Thyroid  nodule - benign 06/23/2012   Arthralgia 06/23/2012   Mild intermittent asthma    Sickle cell trait    History of stomach ulcers    ESOPHAGEAL STRICTURE 10/04/2007   GERD 10/04/2007   Atrophic 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   Dysphagia, pharyngoesophageal phase 10/04/2007    Past Medical History:  Diagnosis Date   Allergy     Anemia    Asthma    Blood transfusion without reported diagnosis    Fibromyalgia    GERD (gastroesophageal reflux disease)    Heart murmur    High cholesterol    History of stomach ulcers    Hypertension    Lupus    MI, old    Moderate obstructive sleep apnea 07/11/2021   Osteoarthritis    Rheumatoid arthritis (HCC)    Stomach ulcer    Thyroid  disease    Thyroid  mass    Vertigo     Family History  Problem Relation Age of Onset   Alcohol abuse Father    Hypertension Father    Heart disease Father    Other Mother        tobacco use disorder, refuses to see a doctor   Osteoporosis Sister    Colon cancer Maternal Uncle    Diabetes Maternal Grandmother    Healthy Son    Healthy Daughter    Multiple sclerosis Daughter    Colon polyps Neg Hx    Esophageal cancer Neg Hx    Kidney disease Neg Hx    Stomach cancer Neg Hx  Rectal cancer Neg Hx    Past Surgical History:  Procedure Laterality Date   ABDOMINAL HYSTERECTOMY     done for menorrhagia, ovaries remain   CERVICAL DISCECTOMY     plates and screws in neck   CESAREAN SECTION     COLONOSCOPY     ESOPHAGEAL MANOMETRY N/A 02/12/2014   Procedure: ESOPHAGEAL MANOMETRY (EM);  Surgeon: Lamar JONETTA Aho, MD;  Location: [facility];  Service: Endoscopy;  Laterality: N/A;   ESOPHAGOGASTRODUODENOSCOPY  03/04/2011   Procedure: ESOPHAGOGASTRODUODENOSCOPY (EGD);  Surgeon: Lamar JONETTA Aho, MD;  Location: [facility];  Service: Endoscopy;  Laterality: N/A;  BOTOX  Injection   ESOPHAGOGASTRODUODENOSCOPY  (EGD) WITH PROPOFOL  N/A 03/12/2019   Procedure: ESOPHAGOGASTRODUODENOSCOPY (EGD) WITH PROPOFOL ;  Surgeon: Rollin Dover, MD;  Location: [facility];  Service: Endoscopy;  Laterality: N/A;   exc benign breast lump     TONSILLECTOMY     UPPER GASTROINTESTINAL ENDOSCOPY     Social History   Tobacco Use   Smoking status: Never    Passive exposure: Never   Smokeless tobacco: Never  Vaping Use   Vaping status: Never Used  Substance Use Topics   Alcohol use: Not Currently    Comment: 1 glass every 3 months   Drug use: No   Social History   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       Immunization History  Administered Date(s) Administered   Influenza Inj Mdck Quad Pf 10/22/2020   Influenza Split 11/04/2015, 10/11/2017, 10/21/2018   Influenza, Mdck, Trivalent,PF 6+ MOS(egg free) 10/06/2022   Influenza, Quadrivalent, Recombinant, Inj, Pf 10/11/2017   Influenza,inj,Quad PF,6+ Mos 11/04/2015, 10/21/2018, 09/23/2021   Influenza-Unspecified 10/11/2017   PFIZER Comirnaty(Gray Top)Covid-19 Tri-Sucrose Vaccine 05/10/2020   PFIZER(Purple Top)SARS-COV-2 Vaccination 03/20/2019, 04/12/2019, 12/07/2019, 05/10/2020   PNEUMOCOCCAL CONJUGATE-20 10/22/2020   Pfizer(Comirnaty)Fall Seasonal Vaccine 12 years and older 11/16/2021, 10/06/2022   Pneumococcal Polysaccharide-23 01/09/2020   Tdap 08/20/2020   Zoster Recombinant(Shingrix) 10/11/2017, 12/25/2017, 01/09/2020   Zoster, Unspecified 10/11/2017, 12/25/2017     Objective: Vital Signs: There were no vitals taken for this visit.   Physical Exam   Musculoskeletal Exam: ***  CDAI Exam: CDAI Score: -- Patient Global: --; Provider Global: -- Swollen: --; Tender: -- Joint Exam 12/24/2023   No joint exam has been documented for this visit   There is currently no information documented on the homunculus. Go to the Rheumatology activity and complete the homunculus joint  exam.  Investigation: No additional findings.  Imaging: No results found.  Recent Labs: Lab Results  Component Value Date   WBC 4.4 10/26/2023   HGB 12.3 10/26/2023   PLT 306 10/26/2023   NA 140 10/26/2023   K 3.7 10/26/2023   CL 100 10/26/2023   CO2 31 10/26/2023   GLUCOSE 92 10/26/2023   BUN 11 10/26/2023   CREATININE 0.70 10/26/2023   BILITOT 0.5 10/26/2023   ALKPHOS 47 04/10/2023   AST 16 10/26/2023   ALT 12 10/26/2023   PROT 7.5 10/26/2023   ALBUMIN 3.5 04/10/2023   CALCIUM 9.5 10/26/2023   GFRAA 97 06/24/2020   QFTBGOLDPLUS NEGATIVE 07/15/2023    Speciality Comments: No specialty comments available.  Procedures:  No procedures performed Allergies: Aspirin, Hydrocodone -acetaminophen , Meperidine hcl, Morphine, Oxycodone -acetaminophen , Ibuprofen, Codeine, Demerol [meperidine], Imdur [isosorbide nitrate], Isosorbide dinitrate, Procaine hcl, Toprol xl [metoprolol], Toradol  [ketorolac  tromethamine ], and Tramadol    Assessment / Plan:     Visit Diagnoses: No diagnosis found.  Orders: No orders  of the defined types were placed in this encounter.  No orders of the defined types were placed in this encounter.   Face-to-face time spent with patient was *** minutes. Greater than 50% of time was spent in counseling and coordination of care.  Follow-Up Instructions: No follow-ups on file.   Maya Nash, MD  Note - This record has been created using Animal nutritionist.  Chart creation errors have been sought, but may not always  have been located. Such creation errors do not reflect on  the standard of medical care.

## 2023-12-24 ENCOUNTER — Ambulatory Visit: Admitting: Rheumatology

## 2023-12-24 DIAGNOSIS — M7061 Trochanteric bursitis, right hip: Secondary | ICD-10-CM

## 2023-12-24 DIAGNOSIS — M351 Other overlap syndromes: Secondary | ICD-10-CM

## 2023-12-24 DIAGNOSIS — E559 Vitamin D deficiency, unspecified: Secondary | ICD-10-CM

## 2023-12-24 DIAGNOSIS — I10 Essential (primary) hypertension: Secondary | ICD-10-CM

## 2023-12-24 DIAGNOSIS — Z8711 Personal history of peptic ulcer disease: Secondary | ICD-10-CM

## 2023-12-24 DIAGNOSIS — Z79899 Other long term (current) drug therapy: Secondary | ICD-10-CM

## 2023-12-24 DIAGNOSIS — G8929 Other chronic pain: Secondary | ICD-10-CM

## 2023-12-24 DIAGNOSIS — F5101 Primary insomnia: Secondary | ICD-10-CM

## 2023-12-24 DIAGNOSIS — M797 Fibromyalgia: Secondary | ICD-10-CM

## 2023-12-24 DIAGNOSIS — G4733 Obstructive sleep apnea (adult) (pediatric): Secondary | ICD-10-CM

## 2023-12-24 DIAGNOSIS — Z8709 Personal history of other diseases of the respiratory system: Secondary | ICD-10-CM

## 2023-12-24 DIAGNOSIS — E785 Hyperlipidemia, unspecified: Secondary | ICD-10-CM

## 2023-12-24 DIAGNOSIS — M170 Bilateral primary osteoarthritis of knee: Secondary | ICD-10-CM

## 2023-12-24 DIAGNOSIS — R5382 Chronic fatigue, unspecified: Secondary | ICD-10-CM

## 2023-12-24 DIAGNOSIS — Z8719 Personal history of other diseases of the digestive system: Secondary | ICD-10-CM

## 2023-12-24 DIAGNOSIS — G5760 Lesion of plantar nerve, unspecified lower limb: Secondary | ICD-10-CM

## 2023-12-26 ENCOUNTER — Other Ambulatory Visit: Payer: Self-pay | Admitting: Family Medicine

## 2023-12-27 ENCOUNTER — Other Ambulatory Visit: Payer: Self-pay | Admitting: Family Medicine

## 2023-12-27 DIAGNOSIS — K219 Gastro-esophageal reflux disease without esophagitis: Secondary | ICD-10-CM

## 2024-01-05 ENCOUNTER — Other Ambulatory Visit: Payer: Self-pay | Admitting: *Deleted

## 2024-01-05 DIAGNOSIS — M351 Other overlap syndromes: Secondary | ICD-10-CM

## 2024-01-05 DIAGNOSIS — Z79899 Other long term (current) drug therapy: Secondary | ICD-10-CM

## 2024-01-06 ENCOUNTER — Ambulatory Visit: Payer: Self-pay | Admitting: Physician Assistant

## 2024-01-06 NOTE — Progress Notes (Signed)
 CBC and CMP WNL

## 2024-01-06 NOTE — Progress Notes (Unsigned)
 Office Visit Note  [name]             Date of Birth: 1960/05/12           MRN: [id number]             PCP: Jordan, [name], MD Referring: Jordan, [name], MD Visit Date: 01/11/2024 Occupation: BUS DRIVER  Subjective:    History of Present Illness: Patricia Reed is a 63 y.o. female with history of mixed connective tissue disease.  Patient remains on Xeljanz  11 mg XR by mouth daily, MTX 0.3 ml sq once weekly (reduced dose low WBC count), and folic acid  2 mg daily.   CBC and CMP updated on 10/26/23.   TB gold negative on 07/15/23.   Discussed the importance of holding xeljanz  and methotrexate  if she develops signs or symptoms of an infection and to resume once the infection has completely cleared.   Activities of Daily Living:  Patient reports morning stiffness for *** {minute/hour:19697}.   Patient {ACTIONS;DENIES/REPORTS:21021675::Denies} nocturnal pain.  Difficulty dressing/grooming: {ACTIONS;DENIES/REPORTS:21021675::Denies} Difficulty climbing stairs: {ACTIONS;DENIES/REPORTS:21021675::Denies} Difficulty getting out of chair: {ACTIONS;DENIES/REPORTS:21021675::Denies} Difficulty using hands for taps, buttons, cutlery, and/or writing: {ACTIONS;DENIES/REPORTS:21021675::Denies}  No Rheumatology ROS completed.   PMFS History:  Patient Active Problem List   Diagnosis Date Noted   Moderate obstructive sleep apnea 07/11/2021   Daytime sleepiness 03/20/2021   Prediabetes 03/19/2021   Hypokalemia 07/07/2019   Precordial chest pain 05/04/2019   Educated about COVID-19 virus infection 05/04/2019   Persistent vomiting    Abdominal pain, epigastric    Nausea & vomiting 03/11/2019   Gastritis    Nausea without vomiting 02/28/2019   Class 2 obesity with body mass index (BMI) of 37.0 to 37.9 in adult 11/25/2018   Mixed connective tissue disease 08/14/2017   Recurrent genital herpes 05/12/2017   Shortness of breath 04/15/2017   Dizziness 02/04/2017    Situational anxiety 06/08/2016   Atypical chest pain 06/08/2016   Trapezius muscle spasm 04/23/2016   Fibromyalgia 02/13/2016   Insomnia 02/13/2016   Chronic fatigue 02/13/2016   Vitamin D  deficiency 11/25/2015   Autoimmune disease 11/23/2015   High risk medication use 11/23/2015   Colon cancer screening 01/25/2014   Anterior neck pain 12/01/2013   Inflammatory arthritis 08/18/2012   Allergic rhinitis 06/23/2012   HTN (hypertension) 06/23/2012   Hyperlipidemia 06/23/2012   Thyroid  nodule - benign 06/23/2012   Arthralgia 06/23/2012   Mild intermittent asthma    Sickle cell trait    History of stomach ulcers    ESOPHAGEAL STRICTURE 10/04/2007   GERD 10/04/2007   Atrophic 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   Dysphagia, pharyngoesophageal phase 10/04/2007    Past Medical History:  Diagnosis Date   Allergy     Anemia    Asthma    Blood transfusion without reported diagnosis    Fibromyalgia    GERD (gastroesophageal reflux disease)    Heart murmur    High cholesterol    History of stomach ulcers    Hypertension    Lupus    MI, old    Moderate obstructive sleep apnea 07/11/2021   Osteoarthritis    Rheumatoid arthritis (HCC)    Stomach ulcer    Thyroid  disease    Thyroid  mass    Vertigo     Family History  Problem Relation Age of Onset   Alcohol abuse Father    Hypertension Father    Heart disease Father    Other Mother        tobacco  use disorder, refuses to see a doctor   Osteoporosis Sister    Colon cancer Maternal Uncle    Diabetes Maternal Grandmother    Healthy Son    Healthy Daughter    Multiple sclerosis Daughter    Colon polyps Neg Hx    Esophageal cancer Neg Hx    Kidney disease Neg Hx    Stomach cancer Neg Hx    Rectal cancer Neg Hx    Past Surgical History:  Procedure Laterality Date   ABDOMINAL HYSTERECTOMY     done for menorrhagia, ovaries remain   CERVICAL DISCECTOMY     plates and screws in neck   CESAREAN SECTION     COLONOSCOPY     ESOPHAGEAL  MANOMETRY N/A 02/12/2014   Procedure: ESOPHAGEAL MANOMETRY (EM);  Surgeon: Lamar JONETTA Aho, MD;  Location: [facility];  Service: Endoscopy;  Laterality: N/A;   ESOPHAGOGASTRODUODENOSCOPY  03/04/2011   Procedure: ESOPHAGOGASTRODUODENOSCOPY (EGD);  Surgeon: Lamar JONETTA Aho, MD;  Location: [facility];  Service: Endoscopy;  Laterality: N/A;  BOTOX  Injection   ESOPHAGOGASTRODUODENOSCOPY (EGD) WITH PROPOFOL  N/A 03/12/2019   Procedure: ESOPHAGOGASTRODUODENOSCOPY (EGD) WITH PROPOFOL ;  Surgeon: Rollin Dover, MD;  Location: [facility];  Service: Endoscopy;  Laterality: N/A;   exc benign breast lump     TONSILLECTOMY     UPPER GASTROINTESTINAL ENDOSCOPY     Social History   Tobacco Use   Smoking status: Never    Passive exposure: Never   Smokeless tobacco: Never  Vaping Use   Vaping status: Never Used  Substance Use Topics   Alcohol use: Not Currently    Comment: 1 glass every 3 months   Drug use: No   Social History   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       Immunization History  Administered Date(s) Administered   Influenza Inj Mdck Quad Pf 10/22/2020   Influenza Split 11/04/2015, 10/11/2017, 10/21/2018   Influenza, Mdck, Trivalent,PF 6+ MOS(egg free) 10/06/2022   Influenza, Quadrivalent, Recombinant, Inj, Pf 10/11/2017   Influenza,inj,Quad PF,6+ Mos 11/04/2015, 10/21/2018, 09/23/2021   Influenza-Unspecified 10/11/2017   PFIZER Comirnaty(Gray Top)Covid-19 Tri-Sucrose Vaccine 05/10/2020   PFIZER(Purple Top)SARS-COV-2 Vaccination 03/20/2019, 04/12/2019, 12/07/2019, 05/10/2020   PNEUMOCOCCAL CONJUGATE-20 10/22/2020   Pfizer(Comirnaty)Fall Seasonal Vaccine 12 years and older 11/16/2021, 10/06/2022   Pneumococcal Polysaccharide-23 01/09/2020   Tdap 08/20/2020   Zoster Recombinant(Shingrix) 10/11/2017, 12/25/2017, 01/09/2020   Zoster, Unspecified 10/11/2017, 12/25/2017     Objective: Vital Signs: There were no  vitals taken for this visit.   Physical Exam Vitals and nursing note reviewed.  Constitutional:      Appearance: She is well-developed.  HENT:     Head: Normocephalic and atraumatic.  Eyes:     Conjunctiva/sclera: Conjunctivae normal.  Cardiovascular:     Rate and Rhythm: Normal rate and regular rhythm.     Heart sounds: Normal heart sounds.  Pulmonary:     Effort: Pulmonary effort is normal.     Breath sounds: Normal breath sounds.  Abdominal:     General: Bowel sounds are normal.     Palpations: Abdomen is soft.  Musculoskeletal:     Cervical back: Normal range of motion.  Lymphadenopathy:     Cervical: No cervical adenopathy.  Skin:    General: Skin is warm and dry.     Capillary Refill: Capillary refill takes less than 2 seconds.  Neurological:     Mental Status: She is alert and oriented to person, place,  and time.  Psychiatric:        Behavior: Behavior normal.      Musculoskeletal Exam: ***  CDAI Exam: CDAI Score: -- Patient Global: --; Provider Global: -- Swollen: --; Tender: -- Joint Exam 01/11/2024   No joint exam has been documented for this visit   There is currently no information documented on the homunculus. Go to the Rheumatology activity and complete the homunculus joint exam.  Investigation: No additional findings.  Imaging: No results found.  Recent Labs: Lab Results  Component Value Date   WBC 4.7 01/05/2024   HGB 12.0 01/05/2024   PLT 309 01/05/2024   NA 139 01/05/2024   K 3.6 01/05/2024   CL 101 01/05/2024   CO2 28 01/05/2024   GLUCOSE 100 (H) 01/05/2024   BUN 11 01/05/2024   CREATININE 0.77 01/05/2024   BILITOT 0.9 01/05/2024   ALKPHOS 47 04/10/2023   AST 19 01/05/2024   ALT 13 01/05/2024   PROT 7.9 01/05/2024   ALBUMIN 3.5 04/10/2023   CALCIUM 9.7 01/05/2024   GFRAA 97 06/24/2020   QFTBGOLDPLUS NEGATIVE 07/15/2023    Speciality Comments: No specialty comments available.  Procedures:  No procedures  performed Allergies: Aspirin, Hydrocodone -acetaminophen , Meperidine hcl, Morphine, Oxycodone -acetaminophen , Ibuprofen, Codeine, Demerol [meperidine], Imdur [isosorbide nitrate], Isosorbide dinitrate, Procaine hcl, Toprol xl [metoprolol], Toradol  [ketorolac  tromethamine ], and Tramadol    Assessment / Plan:     Visit Diagnoses: No diagnosis found.  Orders: No orders of the defined types were placed in this encounter.  No orders of the defined types were placed in this encounter.   Face-to-face time spent with patient was *** minutes. Greater than 50% of time was spent in counseling and coordination of care.  Follow-Up Instructions: No follow-ups on file.   Daved JAYSON Gavel, CMA  Note - This record has been created using Animal nutritionist.  Chart creation errors have been sought, but may not always  have been located. Such creation errors do not reflect on  the standard of medical care.

## 2024-01-07 ENCOUNTER — Ambulatory Visit: Payer: Self-pay | Admitting: Rheumatology

## 2024-01-07 LAB — COMPREHENSIVE METABOLIC PANEL WITH GFR
AG Ratio: 1.3 (calc) (ref 1.0–2.5)
ALT: 13 U/L (ref 6–29)
AST: 19 U/L (ref 10–35)
Albumin: 4.5 g/dL (ref 3.6–5.1)
Alkaline phosphatase (APISO): 65 U/L (ref 37–153)
BUN: 11 mg/dL (ref 7–25)
CO2: 28 mmol/L (ref 20–32)
Calcium: 9.7 mg/dL (ref 8.6–10.4)
Chloride: 101 mmol/L (ref 98–110)
Creat: 0.77 mg/dL (ref 0.50–1.05)
Globulin: 3.4 g/dL (ref 1.9–3.7)
Glucose, Bld: 100 mg/dL — ABNORMAL HIGH (ref 65–99)
Potassium: 3.6 mmol/L (ref 3.5–5.3)
Sodium: 139 mmol/L (ref 135–146)
Total Bilirubin: 0.9 mg/dL (ref 0.2–1.2)
Total Protein: 7.9 g/dL (ref 6.1–8.1)
eGFR: 88 mL/min/1.73m2 (ref >=60)

## 2024-01-07 LAB — ANTI-NUCLEAR AB-TITER (ANA TITER): ANA Titer 1: 1:1280 {titer} — ABNORMAL HIGH

## 2024-01-07 LAB — C3 AND C4
C3 Complement: 185 mg/dL (ref 83–193)
C4 Complement: 30 mg/dL (ref 15–57)

## 2024-01-07 LAB — CBC WITH DIFFERENTIAL/PLATELET
Absolute Lymphocytes: 898 {cells}/uL (ref 850–3900)
Absolute Monocytes: 244 {cells}/uL (ref 200–950)
Basophils Absolute: 28 {cells}/uL (ref 0–200)
Basophils Relative: 0.6 %
Eosinophils Absolute: 42 {cells}/uL (ref 15–500)
Eosinophils Relative: 0.9 %
HCT: 37.4 % (ref 35.9–46.0)
Hemoglobin: 12 g/dL (ref 11.7–15.5)
MCH: 28.6 pg (ref 27.0–33.0)
MCHC: 32.1 g/dL (ref 31.6–35.4)
MCV: 89 fL (ref 81.4–101.7)
MPV: 11.2 fL (ref 7.5–12.5)
Monocytes Relative: 5.2 %
Neutro Abs: 3487 {cells}/uL (ref 1500–7800)
Neutrophils Relative %: 74.2 %
Platelets: 309 Thousand/uL (ref 140–400)
RBC: 4.2 Million/uL (ref 3.80–5.10)
RDW: 14.5 % (ref 11.0–15.0)
Total Lymphocyte: 19.1 %
WBC: 4.7 Thousand/uL (ref 3.8–10.8)

## 2024-01-07 LAB — PROTEIN / CREATININE RATIO, URINE
Creatinine, Urine: 195 mg/dL (ref 20–275)
Protein/Creat Ratio: 46 mg/g{creat} (ref 24–184)
Protein/Creatinine Ratio: 0.046 mg/mg{creat} (ref 0.024–0.184)
Total Protein, Urine: 9 mg/dL (ref 5–24)

## 2024-01-07 LAB — ANTI-DNA ANTIBODY, DOUBLE-STRANDED: ds DNA Ab: 1 [IU]/mL

## 2024-01-07 LAB — SEDIMENTATION RATE: Sed Rate: 38 mm/h — ABNORMAL HIGH (ref 0–30)

## 2024-01-07 LAB — ANA: Anti Nuclear Antibody (ANA): POSITIVE — AB

## 2024-01-07 NOTE — Progress Notes (Signed)
 Sed rate is mildly elevated, urine protein creatinine ratio normal, complements normal, double-stranded ENA negative, ANA pending.  Labs do not indicate an autoimmune disease flare.

## 2024-01-08 NOTE — Progress Notes (Signed)
 ANA remains positive.

## 2024-01-09 NOTE — Progress Notes (Signed)
 ANA remains positive.

## 2024-01-11 ENCOUNTER — Encounter: Payer: Self-pay | Admitting: Rheumatology

## 2024-01-11 ENCOUNTER — Ambulatory Visit: Attending: Rheumatology | Admitting: Rheumatology

## 2024-01-11 VITALS — BP 119/80 | HR 80 | Temp 97.4°F | Resp 14 | Ht 65.0 in | Wt 216.8 lb

## 2024-01-11 DIAGNOSIS — M79671 Pain in right foot: Secondary | ICD-10-CM

## 2024-01-11 DIAGNOSIS — M7061 Trochanteric bursitis, right hip: Secondary | ICD-10-CM

## 2024-01-11 DIAGNOSIS — F5101 Primary insomnia: Secondary | ICD-10-CM | POA: Diagnosis not present

## 2024-01-11 DIAGNOSIS — M25511 Pain in right shoulder: Secondary | ICD-10-CM

## 2024-01-11 DIAGNOSIS — E559 Vitamin D deficiency, unspecified: Secondary | ICD-10-CM

## 2024-01-11 DIAGNOSIS — Z8719 Personal history of other diseases of the digestive system: Secondary | ICD-10-CM

## 2024-01-11 DIAGNOSIS — Z79899 Other long term (current) drug therapy: Secondary | ICD-10-CM

## 2024-01-11 DIAGNOSIS — M351 Other overlap syndromes: Secondary | ICD-10-CM | POA: Diagnosis not present

## 2024-01-11 DIAGNOSIS — R5382 Chronic fatigue, unspecified: Secondary | ICD-10-CM | POA: Diagnosis not present

## 2024-01-11 DIAGNOSIS — G4733 Obstructive sleep apnea (adult) (pediatric): Secondary | ICD-10-CM

## 2024-01-11 DIAGNOSIS — G8929 Other chronic pain: Secondary | ICD-10-CM

## 2024-01-11 DIAGNOSIS — M79672 Pain in left foot: Secondary | ICD-10-CM

## 2024-01-11 DIAGNOSIS — E785 Hyperlipidemia, unspecified: Secondary | ICD-10-CM

## 2024-01-11 DIAGNOSIS — M170 Bilateral primary osteoarthritis of knee: Secondary | ICD-10-CM

## 2024-01-11 DIAGNOSIS — Z8711 Personal history of peptic ulcer disease: Secondary | ICD-10-CM

## 2024-01-11 DIAGNOSIS — Z8709 Personal history of other diseases of the respiratory system: Secondary | ICD-10-CM

## 2024-01-11 DIAGNOSIS — I10 Essential (primary) hypertension: Secondary | ICD-10-CM

## 2024-01-11 DIAGNOSIS — M7062 Trochanteric bursitis, left hip: Secondary | ICD-10-CM

## 2024-01-11 DIAGNOSIS — M25512 Pain in left shoulder: Secondary | ICD-10-CM

## 2024-01-11 DIAGNOSIS — M79641 Pain in right hand: Secondary | ICD-10-CM

## 2024-01-11 DIAGNOSIS — R21 Rash and other nonspecific skin eruption: Secondary | ICD-10-CM

## 2024-01-11 DIAGNOSIS — G5760 Lesion of plantar nerve, unspecified lower limb: Secondary | ICD-10-CM

## 2024-01-11 DIAGNOSIS — M797 Fibromyalgia: Secondary | ICD-10-CM | POA: Diagnosis not present

## 2024-01-11 DIAGNOSIS — M79642 Pain in left hand: Secondary | ICD-10-CM

## 2024-01-11 NOTE — Patient Instructions (Addendum)
 Standing Labs We placed an order today for your standing lab work.   Please have your standing labs drawn in March  Please have your labs drawn 2 weeks prior to your appointment so that the provider can discuss your lab results at your appointment, if possible.  Please note that you may see your imaging and lab results in MyChart before we have reviewed them. We will contact you once all results are reviewed. Please allow our office up to 72 hours to thoroughly review all of the results before contacting the office for clarification of your results.  WALK-IN LAB HOURS  Monday through Thursday from 8:00 am - 4:30 pm and Friday from 8:00 am-12:00 pm.  Patients with office visits requiring labs will be seen before walk-in labs.  You may encounter longer than normal wait times. Please allow additional time. Wait times may be shorter on  Monday and Thursday afternoons.  We do not book appointments for walk-in labs. We appreciate your patience and understanding with our staff.   Labs are drawn by [facility]. Please bring your co-pay at the time of your lab draw.  You may receive a bill from [facility] for your lab work.  Please note if you are on Hydroxychloroquine  and and an order has been placed for a Hydroxychloroquine  level,  you will need to have it drawn 4 hours or more after your last dose.  If you wish to have your labs drawn at another location, please call the office 24 hours in advance so we can fax the orders.  The office is located at [address], [address], [place], [place] [address]   If you have any questions regarding directions or hours of operation,  please call [phone].   As a reminder, please drink plenty of water prior to coming for your lab work. Thanks!   Vaccines You are taking a medication(s) that can suppress your immune system.  The following immunizations are recommended: Flu annually Covid-19  RSV Td/Tdap (tetanus, diphtheria, pertussis) every 10  years Pneumonia (Prevnar 15 then Pneumovax 23 at least 1 year apart.  Alternatively, can take Prevnar 20 without needing additional dose) Shingrix: 2 doses from 4 weeks to 6 months apart  Please check with your PCP to make sure you are up to date.   If you have signs or symptoms of an infection or start antibiotics: First, call your PCP for workup of your infection. Hold your medication through the infection, until you complete your antibiotics, and until symptoms resolve if you take the following: Injectable medication (Actemra, Benlysta, Cimzia, Cosentyx, Enbrel, Humira, Kevzara, Orencia, Remicade, Simponi, Stelara, Taltz, Tremfya) Methotrexate  Leflunomide (Arava) Mycophenolate (Cellcept) Xeljanz , Olumiant, or Rinvoq  Because you are taking Xeljanz , Rinvoq, or Olumiant, it is very important to know that this class of medications has a FDA BOXED WARNING for major adverse cardiovascular events (MACE), thrombosis, mortality (including sudden cardiovascular death), serious infections, and lymphomas. MACE is defined as cardiovascular death, myocardial infarction, and stroke. Thrombosis includes deep venous thrombosis (DVT), pulmonary embolism (PE), and arterial thrombosis. If you are a current or former smoker, you are at higher risk for these events  Please get an annual skin examination to screen for skin cancer while you are on Xeljanz .

## 2024-02-10 ENCOUNTER — Ambulatory Visit: Admitting: Physician Assistant

## 2024-02-22 ENCOUNTER — Ambulatory Visit: Admitting: Family Medicine

## 2024-02-23 ENCOUNTER — Encounter: Payer: Self-pay | Admitting: Family Medicine

## 2024-02-23 ENCOUNTER — Ambulatory Visit: Admitting: Family Medicine

## 2024-02-23 VITALS — BP 140/100 | HR 74 | Temp 97.9°F | Resp 16 | Ht 65.0 in | Wt 218.4 lb

## 2024-02-23 DIAGNOSIS — E559 Vitamin D deficiency, unspecified: Secondary | ICD-10-CM

## 2024-02-23 DIAGNOSIS — R7303 Prediabetes: Secondary | ICD-10-CM

## 2024-02-23 DIAGNOSIS — E785 Hyperlipidemia, unspecified: Secondary | ICD-10-CM

## 2024-02-23 DIAGNOSIS — I10 Essential (primary) hypertension: Secondary | ICD-10-CM

## 2024-02-23 DIAGNOSIS — E876 Hypokalemia: Secondary | ICD-10-CM

## 2024-02-23 DIAGNOSIS — G4700 Insomnia, unspecified: Secondary | ICD-10-CM

## 2024-02-23 LAB — LIPID PANEL
Cholesterol: 176 mg/dL (ref 28–200)
HDL: 61.1 mg/dL
LDL Cholesterol: 102 mg/dL — ABNORMAL HIGH (ref 10–99)
NonHDL: 114.75
Total CHOL/HDL Ratio: 3
Triglycerides: 66 mg/dL (ref 10.0–149.0)
VLDL: 13.2 mg/dL (ref 0.0–40.0)

## 2024-02-23 LAB — BASIC METABOLIC PANEL WITH GFR
BUN: 16 mg/dL (ref 6–23)
CO2: 30 meq/L (ref 19–32)
Calcium: 9.8 mg/dL (ref 8.4–10.5)
Chloride: 101 meq/L (ref 96–112)
Creatinine, Ser: 0.8 mg/dL (ref 0.40–1.20)
GFR: 79.31 mL/min
Glucose, Bld: 105 mg/dL — ABNORMAL HIGH (ref 70–99)
Potassium: 3.5 meq/L (ref 3.5–5.1)
Sodium: 140 meq/L (ref 135–145)

## 2024-02-23 LAB — HEMOGLOBIN A1C: Hgb A1c MFr Bld: 6.1 % (ref 4.6–6.5)

## 2024-02-23 LAB — VITAMIN D 25 HYDROXY (VIT D DEFICIENCY, FRACTURES): VITD: 52.11 ng/mL (ref 30.00–100.00)

## 2024-02-23 MED ORDER — ESZOPICLONE 3 MG PO TABS: 3.0000 mg | ORAL_TABLET | Freq: Every evening | ORAL | 3 refills | Status: AC | PRN

## 2024-02-23 MED ORDER — HYDROCHLOROTHIAZIDE 25 MG PO TABS: 25.0000 mg | ORAL_TABLET | Freq: Every day | ORAL | 2 refills | Status: AC

## 2024-02-23 MED ORDER — LOSARTAN POTASSIUM 50 MG PO TABS: 50.0000 mg | ORAL_TABLET | Freq: Every day | ORAL | 2 refills | Status: AC

## 2024-02-23 NOTE — Progress Notes (Unsigned)
 "   Chief Complaint  Patient presents with   Medical Management of Chronic Issues    Discuss medications    Discussed the use of AI scribe software for clinical note transcription with the patient, who gave verbal consent to proceed. History of Present Illness Patricia Reed is a 64 year old female with PMHx significant for GERD, atrophic gastritis, fibromyalgia, hypertension, hyperlipidemia, and back pain here today for follow-up. She was last seen on 04/16/2023. No new problems since her last visit.  Fibromyalgia: She has been regularly seeing her rheumatologist every three months, Dr Dolphus, last seen on 01/11/24.  Insomnia: She previously used Lunesta  3 mg , which allowed her to sleep 6-8 hours. Without it, she sleeps only 2-3 hours. She has not been taking Lunesta  recently but wants to resume it.  She is not exercising regularly and has not made significant dietary changes since her last visit. She is involved in a weight management program at work, which monitors her weight and provides incentives for maintaining a healthy weight. She has not noticed any significant weight changes recently.  Hypertension: BP elevated today. She is currently taking hydrochlorothiazide  25 mg daily and losartan  50 mg daily. Her blood pressure was noted to be 119/80 at the rheumatologist's office, but she has not been checking it at home. Negative for unusual or severe headache, visual changes, exertional chest pain, dyspnea,  focal weakness, or edema.  Lab Results  Component Value Date   CREATININE 0.77 01/05/2024   BUN 11 01/05/2024   NA 139 01/05/2024   K 3.6 01/05/2024   CL 101 01/05/2024   CO2 28 01/05/2024   Her A1c has been mildly elevated, no history of diabetes. Lab Results  Component Value Date   HGBA1C 6.0 10/21/2022   Hyperlipidemia: Currently she is on lovastatin  20 mg daily, which she has tolerated well. Lab Results  Component Value Date   CHOL 183 10/14/2022   HDL 61  10/14/2022   LDLCALC 103 (H) 10/14/2022   TRIG 94 10/14/2022   CHOLHDL 3.0 10/14/2022   Vitamin D  deficiency: She is currently on OTC vitamin D  supplementation. Lab Results  Component Value Date   VD25OH 53 07/08/2022   Review of Systems  Constitutional:  Negative for activity change, appetite change, chills and fever.  HENT:  Negative for sore throat.   Respiratory:  Negative for cough and wheezing.   Gastrointestinal:  Negative for abdominal pain, nausea and vomiting.  Genitourinary:  Negative for decreased urine volume, dysuria and hematuria.  Musculoskeletal:  Positive for arthralgias and myalgias. Negative for gait problem.  Skin:  Negative for rash.  Neurological:  Negative for syncope and facial asymmetry.  Psychiatric/Behavioral:  Negative for confusion. The patient is not nervous/anxious.   See other pertinent positives and negatives in HPI.  Medications Ordered Prior to Encounter[1]  Past Medical History:  Diagnosis Date   Allergy     Anemia    Asthma    Blood transfusion without reported diagnosis    Fibromyalgia    GERD (gastroesophageal reflux disease)    Heart murmur    High cholesterol    History of stomach ulcers    Hypertension    Lupus    MI, old    Moderate obstructive sleep apnea 07/11/2021   Osteoarthritis    Rheumatoid arthritis (HCC)    Stomach ulcer    Thyroid  disease    Thyroid  mass    Vertigo    Allergies[2]  Social History   Socioeconomic History  Marital status: Married    Spouse name: Not on file   Number of children: 4   Years of education: Not on file   Highest education level: Not on file  Occupational History   Occupation: TEACHER ASSISTANT    Employer: [employer]  Tobacco Use   Smoking status: Never    Passive exposure: Never   Smokeless tobacco: Never  Vaping Use   Vaping status: Never Used  Substance and Sexual Activity   Alcohol use: Not Currently    Comment: 1 glass every 3 months   Drug use: No    Sexual activity: Yes    Birth control/protection: None, Surgical    Comment: LAVH  Other Topics Concern   Not on file  Social History Narrative   First grade geologist, 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     Social Drivers of Health   Tobacco Use: Low Risk (02/23/2024)   Patient History    Smoking Tobacco Use: Never    Smokeless Tobacco Use: Never    Passive Exposure: Never  Financial Resource Strain: Not on file  Food Insecurity: Not on file  Transportation Needs: Not on file  Physical Activity: Not on file  Stress: Not on file  Social Connections: Not on file  Depression (PHQ2-9): Medium Risk (02/23/2024)   Depression (PHQ2-9)    PHQ-2 Score: 7  Alcohol Screen: Not on file  Housing: Not on file  Utilities: Not on file  Health Literacy: Not on file    Today's Vitals   02/23/24 0740 02/23/24 0743  BP: (!) 150/100 (!) 140/100  Pulse: 74   Resp: 16   Temp: 97.9 F (36.6 C)   SpO2: 95%   Weight: 218 lb 6.4 oz (99.1 kg)   Height: 5' 5 (1.651 m)    Wt Readings from Last 3 Encounters:  02/23/24 218 lb 6.4 oz (99.1 kg)  01/11/24 216 lb 12.8 oz (98.3 kg)  09/09/23 217 lb (98.4 kg)   Body mass index is 36.34 kg/m.  Physical Exam Vitals and nursing note reviewed.  Constitutional:      General: She is not in acute distress.    Appearance: She is well-developed.  HENT:     Head: Normocephalic and atraumatic.     Mouth/Throat:     Mouth: Mucous membranes are moist.     Pharynx: Oropharynx is clear.  Eyes:     Conjunctiva/sclera: Conjunctivae normal.  Cardiovascular:     Rate and Rhythm: Normal rate and regular rhythm.     Pulses:          Dorsalis pedis pulses are 2+ on the right side and 2+ on the left side.     Heart sounds: No murmur heard. Pulmonary:     Effort: Pulmonary effort is normal. No respiratory distress.     Breath sounds: Normal breath sounds.  Abdominal:     Palpations: Abdomen is soft. There is no mass.     Tenderness: There is  no abdominal tenderness.  Lymphadenopathy:     Cervical: No cervical adenopathy.  Skin:    General: Skin is warm.     Findings: No erythema or rash.  Neurological:     General: No focal deficit present.     Mental Status: She is alert and oriented to person, place, and time.     Cranial Nerves: No cranial nerve deficit.     Gait: Gait normal.  Psychiatric:        Mood  and Affect: Mood and affect normal.   ASSESSMENT AND PLAN:  Patricia Reed was seen today for medical management of chronic issues.  Diagnoses and all orders for this visit:  Orders Placed This Encounter  Procedures   Basic metabolic panel with GFR   Lipid panel   Hemoglobin A1c   VITAMIN D  25 Hydroxy (Vit-D Deficiency, Fractures)   Lab Results  Component Value Date   VD25OH 52.11 02/23/2024   Lab Results  Component Value Date   HGBA1C 6.1 02/23/2024   Lab Results  Component Value Date   NA 140 02/23/2024   CL 101 02/23/2024   K 3.5 02/23/2024   CO2 30 02/23/2024   BUN 16 02/23/2024   CREATININE 0.80 02/23/2024   GFR 79.31 02/23/2024   CALCIUM 9.8 02/23/2024   ALBUMIN 3.5 04/10/2023   GLUCOSE 105 (H) 02/23/2024   Lab Results  Component Value Date   CHOL 176 02/23/2024   HDL 61.10 02/23/2024   LDLCALC 102 (H) 02/23/2024   TRIG 66.0 02/23/2024   CHOLHDL 3 02/23/2024   Primary hypertension Assessment & Plan: BP elevated today, some improvement after a few minutes. She is not monitoring BP at home, instructed to do so. Her BP has been adequately controlled during visits with rheumatologist, so no changes today. Continue HCTZ 25 mg daily and losartan  50 mg daily. We discussed possible complications of elevated BP. Continue low-salt/DASH diet. Eye exam is current. Follow-up in 6 months, before if needed.  Orders: -     Basic metabolic panel with GFR; Future -     hydroCHLOROthiazide ; Take 1 tablet (25 mg total) by mouth daily.  Dispense: 90 tablet; Refill: 2 -     Losartan  Potassium;  Take 1 tablet (50 mg total) by mouth daily.  Dispense: 90 tablet; Refill: 2  Hyperlipidemia, unspecified hyperlipidemia type Continue Lovastatin  20 mg daily and low fat diet. Further recommendations according to FLP results.  -     Lipid panel; Future  Insomnia, unspecified type Assessment & Plan: Problem is not well controlled with non pharmacologic treatment, Lunesta  3 mg helped and she would like to resume it. Continue good sleep hygiene. Follow-up in 6 months.  Orders: -     Eszopiclone ; Take 1 tablet (3 mg total) by mouth at bedtime as needed. Take immediately before bedtime  Dispense: 30 tablet; Refill: 3  Prediabetes Assessment & Plan: Encouraged a healthy lifestyle for diabetes prevention. Last A1c 6.0 in 10/2022.  Orders: -     Hemoglobin A1c; Future  Vitamin D  deficiency, unspecified Assessment & Plan: On OTC vitamin D  supplementation. Further recommendation will be given according to 25 OH vitamin D  result.  Orders: -     VITAMIN D  25 Hydroxy (Vit-D Deficiency, Fractures); Future  Hypokalemia Assessment & Plan: Currently she is on K-Lor 20 mK daily. Further recommendation will be given according to BMP result.   Return in about 6 months (around 08/22/2024) for chronic problems.  Patricia Wojtas, MD [facility]. [place] office.       [1]  Current Outpatient Medications on File Prior to Visit  Medication Sig Dispense Refill   acetaminophen  (TYLENOL ) 650 MG CR tablet Take 650 mg by mouth every 8 (eight) hours as needed for pain.     albuterol  (PROVENTIL ) (2.5 MG/3ML) 0.083% nebulizer solution Take 3 mLs (2.5 mg total) by nebulization every 6 (six) hours as needed for wheezing or shortness of breath. 75 mL 12   Ascorbic Acid (VITAMIN C PO) Take by  mouth.     baclofen  (LIORESAL ) 10 MG tablet TAKE 1 TABLET BY MOUTH DAILY AS NEEDED FOR MUSCLE SPASMS. 90 tablet 0   CALCIUM PO Take by mouth daily.     Estradiol 10 MCG TABS vaginal tablet Yuvafem 10 mcg  vaginal tablet     famotidine  (PEPCID ) 20 MG tablet TAKE 1 TO 2 TABLETS BY MOUTH AT BEDTIME 180 tablet 1   fluticasone  furoate-vilanterol (BREO ELLIPTA ) 100-25 MCG/INH AEPB Inhale 1 puff into the lungs daily. 28 each 0   folic acid  (FOLVITE ) 1 MG tablet TAKE 2 TABLETS BY MOUTH EVERY DAY 180 tablet 3   lovastatin  (MEVACOR ) 20 MG tablet TAKE 1 TABLET BY MOUTH EVERYDAY AT BEDTIME 90 tablet 2   methotrexate  50 MG/2ML injection Inject 0.3 mL into skin once weekly. 4 mL 0   nystatin -triamcinolone  (MYCOLOG II) cream Apply 1 application topically 2 (two) times daily. 60 g 0   Omega-3 Fatty Acids (FISH OIL) 1000 MG CAPS Take by mouth.     pantoprazole  (PROTONIX ) 40 MG tablet Take 1 tablet (40 mg total) by mouth 2 (two) times daily before a meal. 180 tablet 0   potassium chloride  SA (KLOR-CON  M) 20 MEQ tablet TAKE 1 TABLET BY MOUTH EVERY DAY 90 tablet 2   Tuberculin-Allergy  Syringes (ALLERGY  SYRINGE 1CC/27GX1/2) 27G X 1/2 1 ML MISC Patient to use to inject MTX weekly 12 each 3   valACYclovir  (VALTREX ) 500 MG tablet TAKE 1 TABLET BY MOUTH TWICE DAILY X 3 DAYS AS NEEDED FOR OUTBREAKS. 60 tablet 1   vitamin B-12 (CYANOCOBALAMIN ) 100 MCG tablet Take 100 mcg by mouth daily.     VITAMIN D , ERGOCALCIFEROL , PO Take 1 capsule by mouth daily.     XELJANZ  XR 11 MG TB24 TAKE 1 TABLET BY MOUTH 1 TIME A DAY 90 tablet 0   Zinc Citrate-Phytase (ZYTAZE) 25-500 MG CAPS Take by mouth.     diclofenac  sodium (VOLTAREN ) 1 % GEL Apply 3 g to 3 large joints up to 3 times daily. (Patient not taking: Reported on 02/23/2024) 3 Tube 3   valACYclovir  (VALTREX ) 500 MG tablet TAKE 1 TABLET (500 MG TOTAL) BY MOUTH 2 (TWO) TIMES DAILY. FOR 3 DAYS WITH ACUTE EPISODES. (Patient not taking: Reported on 02/23/2024) 18 tablet 3   No current facility-administered medications on file prior to visit.  [2]  Allergies Allergen Reactions   Aspirin Other (See Comments)    GI bleed   Hydrocodone -Acetaminophen  Hives and Nausea And Vomiting    Meperidine Hcl Hives and Nausea And Vomiting    Short term memory loss   Morphine Hives and Nausea And Vomiting   Oxycodone -Acetaminophen  Hives and Nausea And Vomiting    Reaction unknown   Ibuprofen Other (See Comments)    GI bleed   Codeine Nausea Only    Other reaction(s): Other (see comments)   Demerol [Meperidine] Nausea Only   Imdur [Isosorbide Nitrate] Other (See Comments)    Reaction unknown   Isosorbide Dinitrate Other (See Comments)    Other reaction(s): Other (see comments) Other reaction(s): Other (See Comments) Reaction unknown Reaction unknown   Procaine Hcl Other (See Comments)    Ineffective   Toprol Xl [Metoprolol] Other (See Comments)    Reaction unknown   Toradol  [Ketorolac  Tromethamine ] Itching   Tramadol  Itching   "

## 2024-02-23 NOTE — Assessment & Plan Note (Signed)
 Currently she is on K-Lor 20 mK daily. Further recommendation will be given according to BMP result.

## 2024-02-23 NOTE — Assessment & Plan Note (Signed)
 Problem is not well controlled with non pharmacologic treatment, Lunesta  3 mg helped and she would like to resume it. Continue good sleep hygiene. Follow-up in 6 months.

## 2024-02-23 NOTE — Patient Instructions (Addendum)
 A few things to remember from today's visit:  Primary hypertension - Plan: Basic metabolic panel with GFR  Hyperlipidemia, unspecified hyperlipidemia type - Plan: Lipid panel  Insomnia, unspecified type - Plan: Eszopiclone  3 MG TABS  Prediabetes - Plan: Hemoglobin A1c  Vitamin D  deficiency, unspecified - Plan: VITAMIN D  25 Hydroxy (Vit-D Deficiency, Fractures)  No changes today. Monitor blood pressure at home.  If you need refills for medications you take chronically, please call your pharmacy. Do not use My Chart to request refills or for acute issues that need immediate attention. If you send a my chart message, it may take a few days to be addressed, specially if I am not in the office.  Please be sure medication list is accurate. If a new problem present, please set up appointment sooner than planned today.

## 2024-02-23 NOTE — Assessment & Plan Note (Signed)
 On OTC vitamin D  supplementation. Further recommendation will be given according to 25 OH vitamin D  result.

## 2024-02-23 NOTE — Assessment & Plan Note (Signed)
 Encouraged a healthy lifestyle for diabetes prevention. Last A1c 6.0 in 10/2022.

## 2024-02-23 NOTE — Assessment & Plan Note (Signed)
 BP elevated today, some improvement after a few minutes. She is not monitoring BP at home, instructed to do so. Her BP has been adequately controlled during visits with rheumatologist, so no changes today. Continue HCTZ 25 mg daily and losartan  50 mg daily. We discussed possible complications of elevated BP. Continue low-salt/DASH diet. Eye exam is current. Follow-up in 6 months, before if needed.

## 2024-02-24 ENCOUNTER — Ambulatory Visit: Payer: Self-pay | Admitting: Family Medicine

## 2024-02-24 ENCOUNTER — Other Ambulatory Visit: Payer: Self-pay | Admitting: Rheumatology

## 2024-02-24 MED ORDER — LOVASTATIN 20 MG PO TABS: 20.0000 mg | ORAL_TABLET | Freq: Every day | ORAL | 3 refills | Status: AC

## 2024-02-24 NOTE — Telephone Encounter (Signed)
 Last Fill: 11/24/2023  Labs: 02/23/2024 BMP: glucose 105 01/05/2024 CBC and CMP WNL   TB Gold: 07/15/2023 negative    Next Visit: 06/26/2024  Last Visit: 01/11/2024  IK:FRUI (mixed connective tissue disease)   Current Dose per office note on 01/11/2024: Xeljanz  11 mg XR by mouth daily  Okay to refill Xeljanz ?

## 2024-06-26 ENCOUNTER — Ambulatory Visit: Admitting: Family Medicine
